# Patient Record
Sex: Male | Born: 1940
Health system: Southern US, Community
[De-identification: ages and names within clinical notes are randomized; demographics above are authoritative.]

## PROBLEM LIST (undated history)

## (undated) DIAGNOSIS — C719 Malignant neoplasm of brain, unspecified: Secondary | ICD-10-CM

## (undated) DIAGNOSIS — R739 Hyperglycemia, unspecified: Secondary | ICD-10-CM

## (undated) DIAGNOSIS — R569 Unspecified convulsions: Secondary | ICD-10-CM

## (undated) HISTORY — DX: Malignant neoplasm of brain, unspecified: C71.9

## (undated) HISTORY — DX: Hyperglycemia, unspecified: R73.9

## (undated) HISTORY — DX: Unspecified convulsions: R56.9

---

## 2003-01-22 ENCOUNTER — Encounter: Payer: Self-pay | Admitting: Internal Medicine

## 2008-05-27 ENCOUNTER — Encounter (INDEPENDENT_AMBULATORY_CARE_PROVIDER_SITE_OTHER): Payer: Self-pay | Admitting: *Deleted

## 2008-07-12 ENCOUNTER — Encounter (INDEPENDENT_AMBULATORY_CARE_PROVIDER_SITE_OTHER): Payer: Self-pay | Admitting: *Deleted

## 2008-07-12 ENCOUNTER — Ambulatory Visit: Payer: Self-pay | Admitting: Internal Medicine

## 2008-07-12 DIAGNOSIS — F528 Other sexual dysfunction not due to a substance or known physiological condition: Secondary | ICD-10-CM

## 2008-07-13 ENCOUNTER — Encounter: Payer: Self-pay | Admitting: Internal Medicine

## 2008-07-14 ENCOUNTER — Ambulatory Visit: Payer: Self-pay | Admitting: Internal Medicine

## 2008-07-19 LAB — CONVERTED CEMR LAB
BUN: 10 mg/dL (ref 6–23)
Basophils Relative: 0 % (ref 0.0–3.0)
Calcium: 9.1 mg/dL (ref 8.4–10.5)
Chloride: 103 meq/L (ref 96–112)
Cholesterol: 168 mg/dL (ref 0–200)
Creatinine, Ser: 0.9 mg/dL (ref 0.4–1.5)
Eosinophils Absolute: 0.1 10*3/uL (ref 0.0–0.7)
Eosinophils Relative: 2.3 % (ref 0.0–5.0)
GFR calc non Af Amer: 89 mL/min
HCT: 41.9 % (ref 39.0–52.0)
Hemoglobin: 14.3 g/dL (ref 13.0–17.0)
MCV: 89.3 fL (ref 78.0–100.0)
Monocytes Absolute: 0.7 10*3/uL (ref 0.1–1.0)
Neutro Abs: 3.3 10*3/uL (ref 1.4–7.7)
Neutrophils Relative %: 53.5 % (ref 43.0–77.0)
RBC: 4.69 M/uL (ref 4.22–5.81)
WBC: 6.1 10*3/uL (ref 4.5–10.5)

## 2008-07-20 ENCOUNTER — Ambulatory Visit: Payer: Self-pay | Admitting: Internal Medicine

## 2008-07-26 ENCOUNTER — Telehealth (INDEPENDENT_AMBULATORY_CARE_PROVIDER_SITE_OTHER): Payer: Self-pay | Admitting: *Deleted

## 2008-07-26 DIAGNOSIS — K921 Melena: Secondary | ICD-10-CM

## 2016-05-03 ENCOUNTER — Encounter (HOSPITAL_COMMUNITY): Payer: Self-pay | Admitting: Emergency Medicine

## 2016-05-03 ENCOUNTER — Emergency Department (HOSPITAL_COMMUNITY)
Admission: EM | Admit: 2016-05-03 | Discharge: 2016-05-03 | Disposition: A | Discharge status: Home / Self Care | Payer: BLUE CROSS/BLUE SHIELD | Attending: Physician Assistant | Admitting: Physician Assistant

## 2016-05-03 ENCOUNTER — Emergency Department (HOSPITAL_COMMUNITY): Payer: BLUE CROSS/BLUE SHIELD

## 2016-05-03 DIAGNOSIS — Z7982 Long term (current) use of aspirin: Secondary | ICD-10-CM | POA: Diagnosis not present

## 2016-05-03 DIAGNOSIS — R079 Chest pain, unspecified: Secondary | ICD-10-CM | POA: Insufficient documentation

## 2016-05-03 LAB — BASIC METABOLIC PANEL
Anion gap: 8 (ref 5–15)
BUN: 11 mg/dL (ref 6–20)
CHLORIDE: 103 mmol/L (ref 101–111)
CO2: 28 mmol/L (ref 22–32)
CREATININE: 0.96 mg/dL (ref 0.61–1.24)
Calcium: 8.9 mg/dL (ref 8.9–10.3)
GFR calc Af Amer: >60 mL/min (ref >60)
GFR calc non Af Amer: >60 mL/min (ref >60)
GLUCOSE: 105 mg/dL — AB (ref 65–99)
POTASSIUM: 3.7 mmol/L (ref 3.5–5.1)
SODIUM: 139 mmol/L (ref 135–145)

## 2016-05-03 LAB — CBC
HEMATOCRIT: 40.3 % (ref 39.0–52.0)
Hemoglobin: 14 g/dL (ref 13.0–17.0)
MCH: 31.2 pg (ref 26.0–34.0)
MCHC: 34.7 g/dL (ref 30.0–36.0)
MCV: 89.8 fL (ref 78.0–100.0)
PLATELETS: 220 10*3/uL (ref 150–400)
RBC: 4.49 MIL/uL (ref 4.22–5.81)
RDW: 12.5 % (ref 11.5–15.5)
WBC: 7 10*3/uL (ref 4.0–10.5)

## 2016-05-03 LAB — I-STAT TROPONIN, ED
TROPONIN I, POC: 0 ng/mL (ref 0.00–0.08)
Troponin i, poc: 0 ng/mL (ref 0.00–0.08)

## 2016-05-03 MED ORDER — GI COCKTAIL ~~LOC~~
30.0000 mL | Freq: Once | ORAL | Status: AC
  Administered 2016-05-03: 30 mL via ORAL
  Filled 2016-05-03: qty 30

## 2016-05-03 NOTE — ED Notes (Signed)
Papers reviewed with pt. And he verbalizes understanding. MD present in room to give results and go over results. Pt. Leaving ambulatory

## 2016-05-03 NOTE — ED Triage Notes (Addendum)
Per pt., he woke up this morning w/ central chest pain describing it as "a little knot."  Denies any diaphoresis, SOB, N/V or any other medical problems at this time.  Pt reports that he took 243 ASA upon waking up.

## 2016-05-03 NOTE — Discharge Instructions (Signed)
I'm unsure what caused that pain that you've had overnight. We've done several rounds of tests and believe that is less likely to be your heart. However we recommend that you follow up with your primary care physician and cardiologist as an outpatient. Please return with any concerns.

## 2016-05-03 NOTE — ED Provider Notes (Signed)
[facility] DEPT Provider Note   CSN: [id number] Arrival date & time: 05/03/16  [id number]     History   Chief Complaint Chief Complaint  Patient presents with  . Chest Pain    HPI Christopher Morris is a 75 y.o. male.  HPI   Patient is a 75 year old male presenting with chest pain. This woke him from sleep at 5 AM. Did not radiate towards  Arms or neck.  No nausea no diaphoresis no shortness of breath. Patient has no risk factors except age. He reports he goes to Dr. every November but has never been diagnosed with hypertension hyperlipidemia or diabetes.  Patient ate a bologna sandwich when he arrived home from work at 1 AM. Patient works as a Consulting civil [place]  History reviewed. No pertinent past medical history.  Patient Active Problem List   Diagnosis Date Noted  . BLOOD IN STOOL 07/26/2008  . ERECTILE DYSFUNCTION 07/12/2008    History reviewed. No pertinent surgical history.     Home Medications    Prior to Admission medications   Medication Sig Start Date End Date Taking? Authorizing Provider  aspirin EC 81 MG tablet Take 243 mg by mouth once.   Yes Historical Provider, MD    Family History No family history on file.  Social History Social History  Substance Use Topics  . Smoking status: Not on file  . Smokeless tobacco: Not on file  . Alcohol use Not on file     Allergies   Review of patient's allergies indicates no known allergies.   Review of Systems Review of Systems  Constitutional: Negative for activity change, fatigue and fever.  HENT: Negative for congestion.   Respiratory: Negative for shortness of breath.   Cardiovascular: Positive for chest pain. Negative for palpitations and leg swelling.  Gastrointestinal: Negative for abdominal pain.  Neurological: Negative for dizziness.  All other systems reviewed and are negative.    Physical Exam Updated Vital Signs BP 137/74   Pulse 68   Temp 98.4 F (36.9 C) (Oral)    Resp 18   Ht 6\' 4"  (1.93 m)   Wt 210 lb (95.3 kg)   SpO2 95%   BMI 25.56 kg/m   Physical Exam  Constitutional: He appears well-developed and well-nourished.  HENT:  Head: Normocephalic and atraumatic.  Eyes: Conjunctivae are normal.  Neck: Neck supple.  Cardiovascular: Normal rate and regular rhythm.   No murmur heard. Pulmonary/Chest: Effort normal and breath sounds normal.  Abdominal: Soft. There is no tenderness.  Musculoskeletal: He exhibits no edema.  Neurological: He is alert.  Skin: Skin is warm and dry.  Psychiatric: He has a normal mood and affect.  Nursing note and vitals reviewed.    ED Treatments / Results  Labs (all labs ordered are listed, but only abnormal results are displayed) Labs Reviewed  BASIC METABOLIC PANEL - Abnormal; Notable for the following:       Result Value   Glucose, Bld 105 (*)    All other components within normal limits  CBC  I-STAT TROPOININ, ED  I-STAT TROPOININ, ED    EKG  EKG Interpretation  Date/Time:  Thursday May 03 2016 06:37:02 EDT Ventricular Rate:  70 PR Interval:    QRS Duration: 91 QT Interval:  378 QTC Calculation: 408 R Axis:   22 Text Interpretation:  Sinus rhythm Abnormal R-wave progression, early transition No previous ECGs available Confirmed by Christy Gentles  MD, DONALD ([id number]) on 05/03/2016 6:45:41 AM Also confirmed by Christy Gentles  MD, DONALD ([id number]), editor [place]  CCT, BEVERLY ([id number])  on 05/03/2016 7:55:16 AM       Radiology Dg Chest 2 View  Result Date: 05/03/2016 CLINICAL DATA:  Chest pain. EXAM: CHEST  2 VIEW COMPARISON:  No prior . FINDINGS: Mediastinum hilar structures normal. Mild left base subsegmental atelectasis. No pleural effusion or pneumothorax. Heart size normal. No acute bony abnormality. IMPRESSION: Mild left base subsegmental atelectasis, otherwise negative exam. Electronically Signed   By: Marcello Moores  Register   On: 05/03/2016 07:28    Procedures Procedures (including critical care  time)  Medications Ordered in ED Medications  gi cocktail (Maalox,Lidocaine,Donnatal) (30 mLs Oral Given 05/03/16 [id number])     Initial Impression / Assessment and Plan / ED Course  I have reviewed the triage vital signs and the nursing notes.  Pertinent labs & imaging results that were available during my care of the patient were reviewed by me and considered in my medical decision making (see chart for details).  Clinical Course    Patient is an incredibly pleasant 75 year old male still working with no past medical history complaining of chest pain. Despite patient's age he has a low heart score and will be able to do a delta troponin. Patient took aspirin prior to arrival.  Do not suspect PE, dissection or any other pathology given the subtelty of this pain. Patient just complains of pain at the base of the sternum and he felt a "knot". On physical exam I believe that this is just the sternal notch.  However given his age we will do a delta troponin. We'll treat for GERD in the case that that's what it is.  Delta trop negative, will give follow up with cards, and PCP.   Patient is comfortable, ambulatory, and taking PO at time of discharge.  Patient expressed understanding about return precautions.    Final Clinical Impressions(s) / ED Diagnoses   Final diagnoses:  Chest pain, unspecified type    New Prescriptions Discharge Medication List as of 05/03/2016 11:51 AM       Javayah Magaw Lyn Tavion Senkbeil, MD 05/03/16 1420

## 2017-06-01 IMAGING — DX DG CHEST 2V
2 series · 2 of 2 positions shown · non-contrast
Comparison: No prior .

CLINICAL DATA: Chest pain.

EXAM:
CHEST  2 VIEW

[w chest pa]
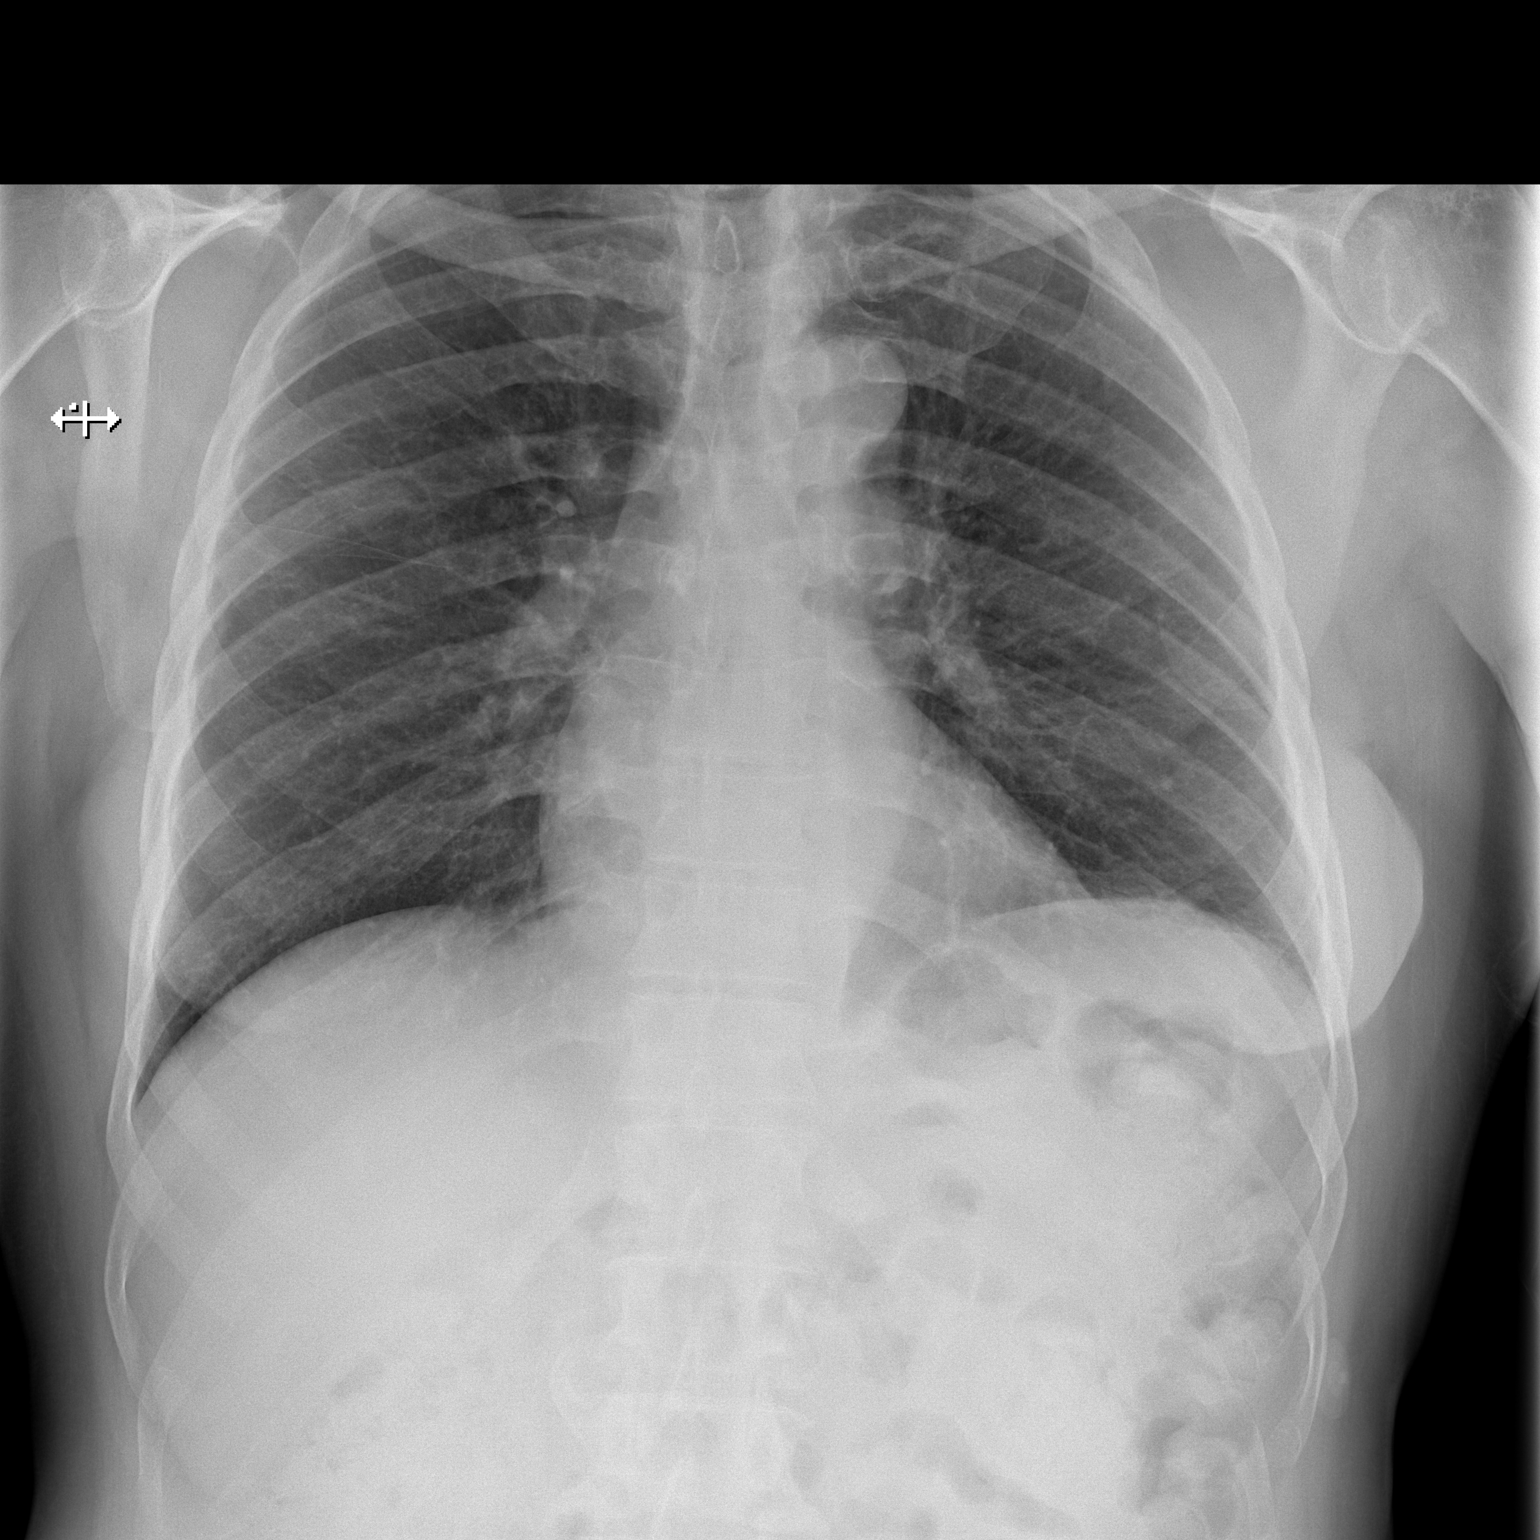

[w chest lat]
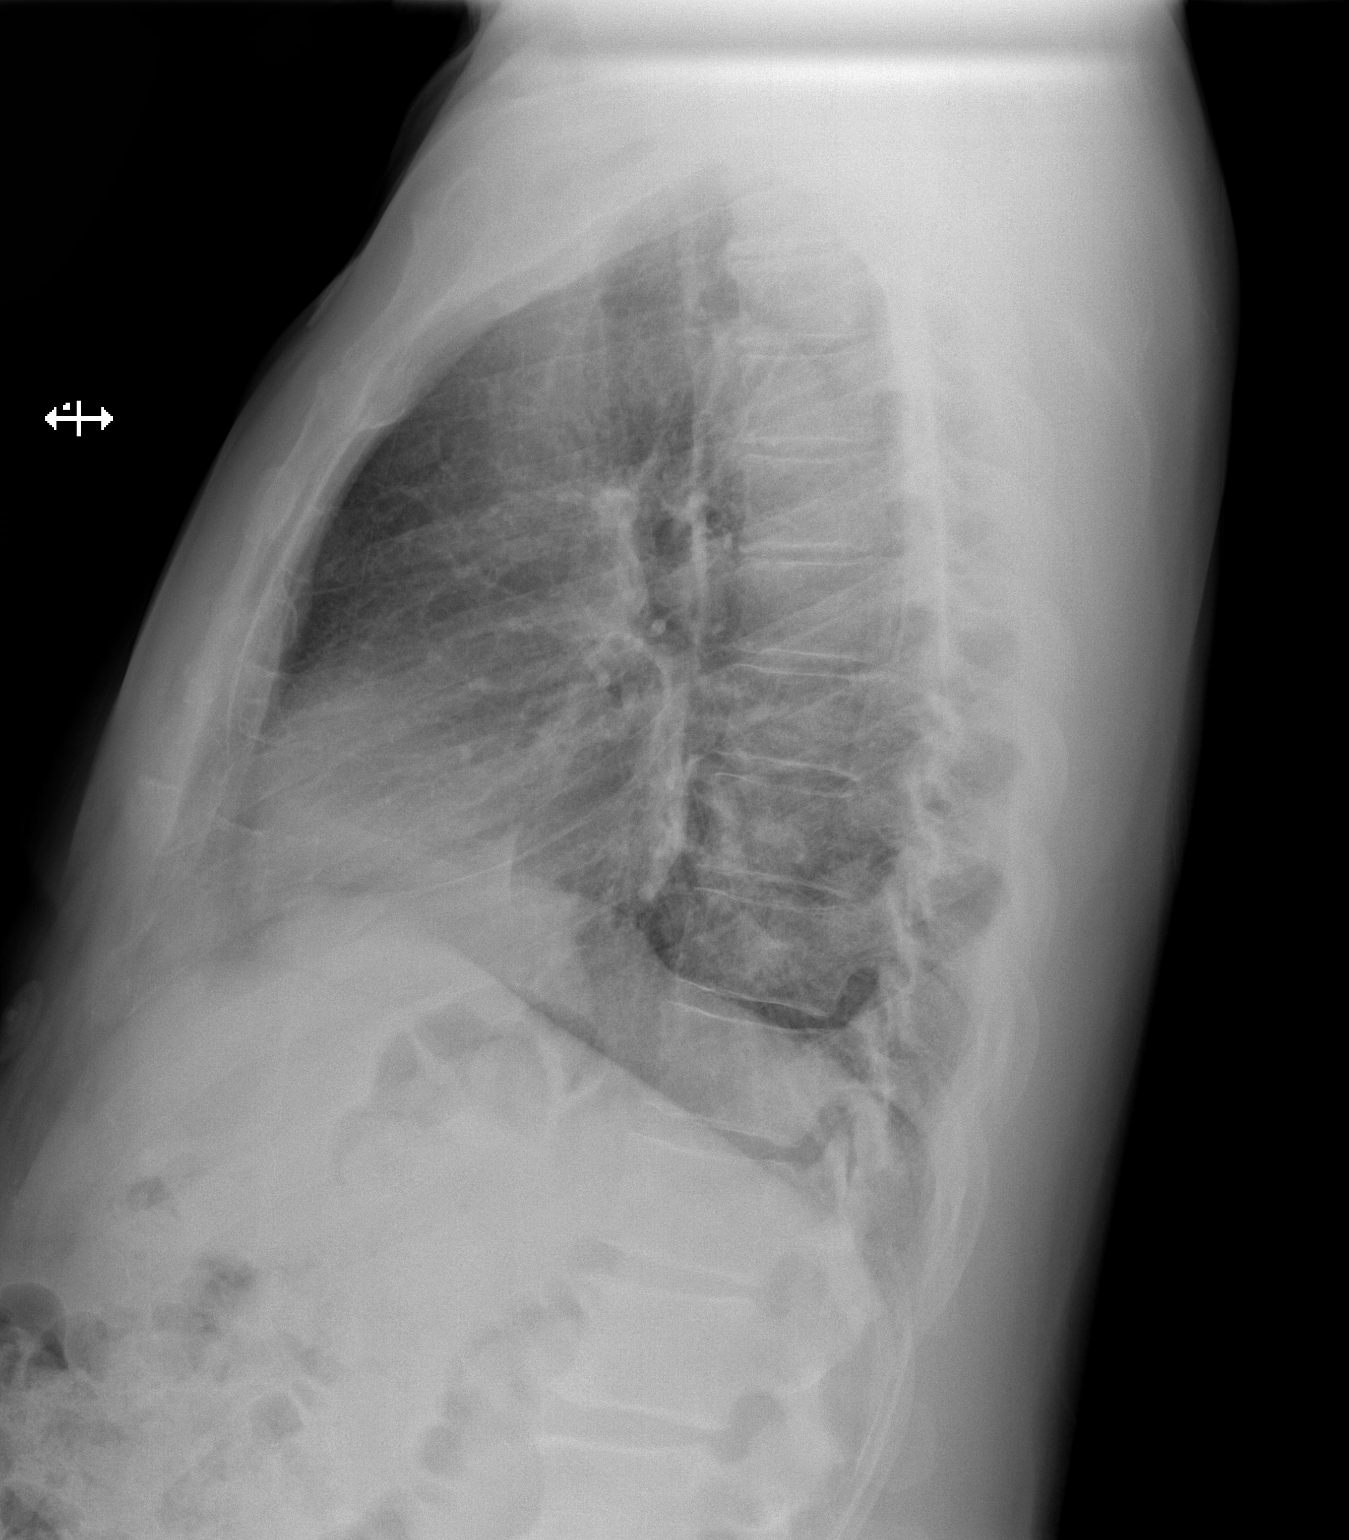

[2 of 2 positions shown; findings below may reference images not displayed]

FINDINGS: Mediastinum hilar structures normal. Mild left base subsegmental
atelectasis. No pleural effusion or pneumothorax. Heart size normal.
No acute bony abnormality.
IMPRESSION: Mild left base subsegmental atelectasis, otherwise negative exam.

## 2017-09-25 ENCOUNTER — Inpatient Hospital Stay (HOSPITAL_COMMUNITY)
Admission: EM | Admit: 2017-09-25 | Discharge: 2017-10-04 | DRG: 025 | Disposition: A | Discharge status: Skilled Nursing Facility | Payer: Medicare Other | Attending: Family Medicine | Admitting: Family Medicine

## 2017-09-25 ENCOUNTER — Emergency Department (HOSPITAL_COMMUNITY): Payer: Medicare Other

## 2017-09-25 ENCOUNTER — Other Ambulatory Visit: Payer: Self-pay

## 2017-09-25 ENCOUNTER — Encounter (HOSPITAL_COMMUNITY): Payer: Self-pay | Admitting: *Deleted

## 2017-09-25 ENCOUNTER — Inpatient Hospital Stay (HOSPITAL_COMMUNITY): Payer: Medicare Other

## 2017-09-25 DIAGNOSIS — G9389 Other specified disorders of brain: Secondary | ICD-10-CM | POA: Diagnosis not present

## 2017-09-25 DIAGNOSIS — E876 Hypokalemia: Secondary | ICD-10-CM | POA: Diagnosis not present

## 2017-09-25 DIAGNOSIS — R739 Hyperglycemia, unspecified: Secondary | ICD-10-CM

## 2017-09-25 DIAGNOSIS — R279 Unspecified lack of coordination: Secondary | ICD-10-CM | POA: Diagnosis not present

## 2017-09-25 DIAGNOSIS — C711 Malignant neoplasm of frontal lobe: Secondary | ICD-10-CM | POA: Diagnosis present

## 2017-09-25 DIAGNOSIS — D72829 Elevated white blood cell count, unspecified: Secondary | ICD-10-CM | POA: Diagnosis not present

## 2017-09-25 DIAGNOSIS — R41841 Cognitive communication deficit: Secondary | ICD-10-CM | POA: Diagnosis not present

## 2017-09-25 DIAGNOSIS — G4089 Other seizures: Secondary | ICD-10-CM | POA: Diagnosis present

## 2017-09-25 DIAGNOSIS — D496 Neoplasm of unspecified behavior of brain: Secondary | ICD-10-CM | POA: Diagnosis not present

## 2017-09-25 DIAGNOSIS — G936 Cerebral edema: Secondary | ICD-10-CM | POA: Diagnosis present

## 2017-09-25 DIAGNOSIS — M6281 Muscle weakness (generalized): Secondary | ICD-10-CM | POA: Diagnosis not present

## 2017-09-25 DIAGNOSIS — R531 Weakness: Secondary | ICD-10-CM | POA: Diagnosis present

## 2017-09-25 DIAGNOSIS — C719 Malignant neoplasm of brain, unspecified: Secondary | ICD-10-CM

## 2017-09-25 DIAGNOSIS — G939 Disorder of brain, unspecified: Secondary | ICD-10-CM | POA: Diagnosis not present

## 2017-09-25 DIAGNOSIS — R2689 Other abnormalities of gait and mobility: Secondary | ICD-10-CM | POA: Diagnosis not present

## 2017-09-25 DIAGNOSIS — R569 Unspecified convulsions: Secondary | ICD-10-CM

## 2017-09-25 DIAGNOSIS — Z48810 Encounter for surgical aftercare following surgery on the sense organs: Secondary | ICD-10-CM | POA: Diagnosis not present

## 2017-09-25 HISTORY — DX: Hyperglycemia, unspecified: R73.9

## 2017-09-25 HISTORY — DX: Unspecified convulsions: R56.9

## 2017-09-25 HISTORY — DX: Malignant neoplasm of brain, unspecified: C71.9

## 2017-09-25 LAB — CBC WITH DIFFERENTIAL/PLATELET
Basophils Absolute: 0 10*3/uL (ref 0.0–0.1)
Basophils Relative: 0 %
Eosinophils Absolute: 0.2 10*3/uL (ref 0.0–0.7)
Eosinophils Relative: 1 %
HEMATOCRIT: 37.8 % — AB (ref 39.0–52.0)
HEMOGLOBIN: 13.1 g/dL (ref 13.0–17.0)
LYMPHS ABS: 3.9 10*3/uL (ref 0.7–4.0)
LYMPHS PCT: 34 %
MCH: 34.1 pg — ABNORMAL HIGH (ref 26.0–34.0)
MCHC: 34.7 g/dL (ref 30.0–36.0)
MCV: 98.4 fL (ref 78.0–100.0)
MONO ABS: 0.6 10*3/uL (ref 0.1–1.0)
MONOS PCT: 5 %
NEUTROS ABS: 7.1 10*3/uL (ref 1.7–7.7)
Neutrophils Relative %: 60 %
Platelets: 214 10*3/uL (ref 150–400)
RBC: 3.84 MIL/uL — ABNORMAL LOW (ref 4.22–5.81)
RDW: 12.5 % (ref 11.5–15.5)
WBC: 11.8 10*3/uL — ABNORMAL HIGH (ref 4.0–10.5)

## 2017-09-25 LAB — RAPID URINE DRUG SCREEN, HOSP PERFORMED
Amphetamines: NOT DETECTED
BARBITURATES: NOT DETECTED
Benzodiazepines: NOT DETECTED
COCAINE: NOT DETECTED
Opiates: NOT DETECTED
TETRAHYDROCANNABINOL: NOT DETECTED

## 2017-09-25 LAB — COMPREHENSIVE METABOLIC PANEL
ALK PHOS: 67 U/L (ref 38–126)
ALT: 14 U/L — ABNORMAL LOW (ref 17–63)
ANION GAP: 13 (ref 5–15)
AST: 24 U/L (ref 15–41)
Albumin: 3.9 g/dL (ref 3.5–5.0)
BILIRUBIN TOTAL: 1.2 mg/dL (ref 0.3–1.2)
BUN: 11 mg/dL (ref 6–20)
CALCIUM: 8.9 mg/dL (ref 8.9–10.3)
CO2: 22 mmol/L (ref 22–32)
Chloride: 104 mmol/L (ref 101–111)
Creatinine, Ser: 0.95 mg/dL (ref 0.61–1.24)
GFR calc Af Amer: >60 mL/min (ref >60)
Glucose, Bld: 192 mg/dL — ABNORMAL HIGH (ref 65–99)
POTASSIUM: 3.4 mmol/L — AB (ref 3.5–5.1)
Sodium: 139 mmol/L (ref 135–145)
TOTAL PROTEIN: 6.5 g/dL (ref 6.5–8.1)

## 2017-09-25 LAB — URINALYSIS, ROUTINE W REFLEX MICROSCOPIC
Bilirubin Urine: NEGATIVE
GLUCOSE, UA: NEGATIVE mg/dL
Hgb urine dipstick: NEGATIVE
KETONES UR: 5 mg/dL — AB
Leukocytes, UA: NEGATIVE
NITRITE: NEGATIVE
PROTEIN: NEGATIVE mg/dL
Specific Gravity, Urine: 1.024 (ref 1.005–1.030)
pH: 8 (ref 5.0–8.0)

## 2017-09-25 LAB — ETHANOL: Alcohol, Ethyl (B): <10 mg/dL (ref <10)

## 2017-09-25 LAB — MAGNESIUM: MAGNESIUM: 2 mg/dL (ref 1.7–2.4)

## 2017-09-25 LAB — HEMOGLOBIN A1C
Hgb A1c MFr Bld: 5.6 % (ref 4.8–5.6)
Mean Plasma Glucose: 114.02 mg/dL

## 2017-09-25 MED ORDER — LEVETIRACETAM IN NACL 1000 MG/100ML IV SOLN
1000.0000 mg | Freq: Once | INTRAVENOUS | Status: AC
  Administered 2017-09-25: 1000 mg via INTRAVENOUS
  Filled 2017-09-25: qty 100

## 2017-09-25 MED ORDER — ONDANSETRON HCL 4 MG/2ML IJ SOLN: 4.0000 mg | Freq: Four times a day (QID) | INTRAMUSCULAR | Status: DC | PRN

## 2017-09-25 MED ORDER — LEVETIRACETAM IN NACL 500 MG/100ML IV SOLN
500.0000 mg | Freq: Two times a day (BID) | INTRAVENOUS | Status: DC
  Administered 2017-09-25 – 2017-09-26 (×2): 500 mg via INTRAVENOUS
  Filled 2017-09-25 (×4): qty 100

## 2017-09-25 MED ORDER — DEXAMETHASONE SODIUM PHOSPHATE 10 MG/ML IJ SOLN
4.0000 mg | Freq: Four times a day (QID) | INTRAMUSCULAR | Status: DC
  Administered 2017-09-25 – 2017-09-30 (×21): 4 mg via INTRAVENOUS
  Administered 2017-09-30: 10 mg via INTRAVENOUS
  Administered 2017-10-01 – 2017-10-02 (×6): 4 mg via INTRAVENOUS
  Filled 2017-09-25 (×26): qty 1

## 2017-09-25 MED ORDER — SODIUM CHLORIDE 0.9 % IV SOLN
INTRAVENOUS | Status: DC
  Administered 2017-09-25: 1000 mL via INTRAVENOUS

## 2017-09-25 MED ORDER — ACETAMINOPHEN 650 MG RE SUPP: 650.0000 mg | Freq: Four times a day (QID) | RECTAL | Status: DC | PRN

## 2017-09-25 MED ORDER — GADOBENATE DIMEGLUMINE 529 MG/ML IV SOLN
20.0000 mL | Freq: Once | INTRAVENOUS | Status: AC
  Administered 2017-09-25: 20 mL via INTRAVENOUS

## 2017-09-25 MED ORDER — IOPAMIDOL (ISOVUE-300) INJECTION 61%
INTRAVENOUS | Status: AC
  Administered 2017-09-25: 100 mL
  Filled 2017-09-25: qty 100

## 2017-09-25 MED ORDER — POTASSIUM CHLORIDE CRYS ER 20 MEQ PO TBCR
40.0000 meq | EXTENDED_RELEASE_TABLET | Freq: Once | ORAL | Status: AC
  Administered 2017-09-25: 40 meq via ORAL
  Filled 2017-09-25: qty 2

## 2017-09-25 MED ORDER — SODIUM CHLORIDE 0.9 % IV SOLN
INTRAVENOUS | Status: DC
  Administered 2017-09-25 – 2017-09-30 (×3): via INTRAVENOUS

## 2017-09-25 MED ORDER — ACETAMINOPHEN 325 MG PO TABS: 650.0000 mg | ORAL_TABLET | Freq: Four times a day (QID) | ORAL | Status: DC | PRN

## 2017-09-25 MED ORDER — SENNOSIDES-DOCUSATE SODIUM 8.6-50 MG PO TABS: 1.0000 | ORAL_TABLET | Freq: Every evening | ORAL | Status: DC | PRN

## 2017-09-25 MED ORDER — ONDANSETRON HCL 4 MG PO TABS: 4.0000 mg | ORAL_TABLET | Freq: Four times a day (QID) | ORAL | Status: DC | PRN

## 2017-09-25 MED ORDER — DEXAMETHASONE SODIUM PHOSPHATE 10 MG/ML IJ SOLN
10.0000 mg | Freq: Once | INTRAMUSCULAR | Status: AC
  Administered 2017-09-25: 10 mg via INTRAVENOUS
  Filled 2017-09-25: qty 1

## 2017-09-25 NOTE — ED Notes (Signed)
Placed condom cath on pt for incontinence while in the ED. Notified Gabriel(RN)

## 2017-09-25 NOTE — H&P (Signed)
History and Physical    Christopher Morris [id number] DOB: Jun 17, 1941 DOA: 09/25/2017  PCP: [facility] Consultants:  neurosurgery Patient coming from: Home. Lives with daughter and grandson  Chief Complaint: seizure activity  HPI: Christopher Morris is a 77 y.o. male with no reported medical hx and follows at [facility] . Family reports unsteady gait and difficulty talking for past several weeks. They initially thought symptoms were due to pt bumping his head at work, but grandson witnessed seizure activity this am prompting them to call EMS. Pt incontinent of urine during seizure. Daughter reports pt works full time driving bus at [employer] and is very independent. No hx of seizures prior to today. Daughter denies any recent fever or illness, no complaints of chest pain, abdominal pain, n/v/d. Hx is limited as pt moderately confused on exam.   ED Course: Calm with no further sz activity on arrival to ED. CT head concerning for left frontal mass that was verified by MRI a/w surrounding edema. Pt has been given IV Decadron and Keppra prior to admission evaluation. Seizure precautions in place. Neurosurgery has been asked to consult.   Review of Systems: As per HPI; otherwise review of systems reviewed and negative.   Ambulatory Status: Ambulates without assistance  History reviewed. No pertinent past medical history.  No past surgical history on file.  Social History   Socioeconomic History  . Marital status: Divorced    Spouse name: Not on file  . Number of children: Not on file  . Years of education: Not on file  . Highest education level: Not on file  Social Needs  . Financial resource strain: Not on file  . Food insecurity - worry: Not on file  . Food insecurity - inability: Not on file  . Transportation needs - medical: Not on file  . Transportation needs - non-medical: Not on file  Occupational History  . Not on file  Tobacco Use  . Smoking status: Not on file  Substance  and Sexual Activity  . Alcohol use: Not on file  . Drug use: Not on file  . Sexual activity: Not on file  Other Topics Concern  . Not on file  Social History Narrative  . Not on file    No Known Allergies  No family history on file.  Prior to Admission medications   Medication Sig Start Date End Date Taking? Authorizing Provider  aspirin EC 81 MG tablet Take 243 mg by mouth once.    [provider]    Physical Exam: Vitals:   09/25/17 1315 09/25/17 1330 09/25/17 1345 09/25/17 1400  BP: (!) 148/81 140/77 (!) 141/79 (!) 149/84  Pulse: 84 81 86 88  Resp: 18 18 19 18   Temp:      TempSrc:      SpO2: 98% 97% 97% 97%  Weight:      Height:         General:  Appears calm and comfortable and is NAD Eyes: PERRL, EOMI, normal lids, iris ENT: grossly normal hearing, lips & tongue, mmm; appropriate dentition Neck: no LAD, masses or thyromegaly; no carotid bruits Cardiovascular: S1S2 RRR, no m/r/g. No LE edema.  Respiratory:  CTA bilaterally with no wheezes/rales/rhonchi.  Normal respiratory effort. Abdomen: soft, NT, ND, NABS Skin: no rash or induration seen on limited exam Musculoskeletal: grossly normal tone BUE/BLE, good ROM, no bony abnormality Lower extremity: No LE edema.  Limited foot exam with no ulcerations.  2+ distal pulses. Psychiatric: normal mood with flat affect.  Answering with one word which daughter states is unusual for him. Alert and oriented to name and people around him Neurologic: 4/5 strength in RUE o/w limited exam unremarkable,  sensation intact    Radiological Exams on Admission: Dg Chest 2 View  Result Date: 09/25/2017 CLINICAL DATA:  Seizure EXAM: CHEST - 2 VIEW COMPARISON:  May 03, 2016 FINDINGS: There is no edema or consolidation. The heart size and pulmonary vascularity are normal. No adenopathy. No pneumothorax. No bone lesions. IMPRESSION: No edema or consolidation. Electronically Signed   By: Lowella Grip III M.D.   On:  09/25/2017 09:09   Ct Head Wo Contrast  Result Date: 09/25/2017 CLINICAL DATA:  Seizure and fall out of bed. EXAM: CT HEAD WITHOUT CONTRAST CT CERVICAL SPINE WITHOUT CONTRAST TECHNIQUE: Multidetector CT imaging of the head and cervical spine was performed following the standard protocol without intravenous contrast. Multiplanar CT image reconstructions of the cervical spine were also generated. COMPARISON:  None. FINDINGS: CT HEAD FINDINGS Brain: There is a possible 3.0 cm mass in the left frontal lobe with prominent surrounding vasogenic edema. Gray-white matter differentiation is preserved. Small amount of hyperdensity along the superior aspect of the mass may reflect hemorrhage. There is slight rightward shift of the anterior falx and effacement of the left lateral ventricle frontal horn. No hydrocephalus or extra-axial collection. Vascular: Atherosclerotic vascular calcification of the carotid siphons. No hyperdense vessel. Skull: Normal. Negative for fracture or focal lesion. Sinuses/Orbits: No acute finding. Other: None. CT CERVICAL SPINE FINDINGS Alignment: Reversal of the normal cervical lordosis. No traumatic malalignment. Skull base and vertebrae: No acute fracture. No primary bone lesion or focal pathologic process. Soft tissues and spinal canal: No prevertebral fluid or swelling. No visible canal hematoma. Disc levels: Moderate to severe degenerative disc disease and facet uncovertebral hypertrophy throughout the cervical spine. Multilevel moderate to severe neuroforaminal stenoses. Upper chest: Negative. Other: None. IMPRESSION: 1. Probable 3.0 cm mass in the left frontal lobe with prominent surrounding vasogenic edema. Small amount of hyperdensity along the superior aspect of the mass likely reflects a hemorrhagic component. Findings less likely represent hemorrhagic infarct given relative preservation of the gray-white differentiation. Recommend contrast-enhanced MRI of the brain for further  evaluation. 2. No acute cervical spine fracture. Moderate to severe degenerative changes throughout the cervical spine. Critical Value/emergent results were called by telephone at the time of interpretation on 09/25/2017 at 9:12 am to Dr. Isla Pence , who verbally acknowledged these results. Electronically Signed   By: Titus Dubin M.D.   On: 09/25/2017 09:17   Ct Cervical Spine Wo Contrast  Result Date: 09/25/2017 CLINICAL DATA:  Seizure and fall out of bed. EXAM: CT HEAD WITHOUT CONTRAST CT CERVICAL SPINE WITHOUT CONTRAST TECHNIQUE: Multidetector CT imaging of the head and cervical spine was performed following the standard protocol without intravenous contrast. Multiplanar CT image reconstructions of the cervical spine were also generated. COMPARISON:  None. FINDINGS: CT HEAD FINDINGS Brain: There is a possible 3.0 cm mass in the left frontal lobe with prominent surrounding vasogenic edema. Gray-white matter differentiation is preserved. Small amount of hyperdensity along the superior aspect of the mass may reflect hemorrhage. There is slight rightward shift of the anterior falx and effacement of the left lateral ventricle frontal horn. No hydrocephalus or extra-axial collection. Vascular: Atherosclerotic vascular calcification of the carotid siphons. No hyperdense vessel. Skull: Normal. Negative for fracture or focal lesion. Sinuses/Orbits: No acute finding. Other: None. CT CERVICAL SPINE FINDINGS Alignment: Reversal of the normal cervical lordosis. No  traumatic malalignment. Skull base and vertebrae: No acute fracture. No primary bone lesion or focal pathologic process. Soft tissues and spinal canal: No prevertebral fluid or swelling. No visible canal hematoma. Disc levels: Moderate to severe degenerative disc disease and facet uncovertebral hypertrophy throughout the cervical spine. Multilevel moderate to severe neuroforaminal stenoses. Upper chest: Negative. Other: None. IMPRESSION: 1. Probable 3.0  cm mass in the left frontal lobe with prominent surrounding vasogenic edema. Small amount of hyperdensity along the superior aspect of the mass likely reflects a hemorrhagic component. Findings less likely represent hemorrhagic infarct given relative preservation of the gray-white differentiation. Recommend contrast-enhanced MRI of the brain for further evaluation. 2. No acute cervical spine fracture. Moderate to severe degenerative changes throughout the cervical spine. Critical Value/emergent results were called by telephone at the time of interpretation on 09/25/2017 at 9:12 am to Dr. Isla Pence , who verbally acknowledged these results. Electronically Signed   By: Titus Dubin M.D.   On: 09/25/2017 09:17   [name] ZW Contrast  Result Date: 09/25/2017 CLINICAL DATA:  77 year old male found down by bed. Possible seizure, incontinent. Combative. EXAM: MRI HEAD WITHOUT AND WITH CONTRAST TECHNIQUE: Multiplanar, multiecho pulse sequences of the brain and surrounding structures were obtained without and with intravenous contrast. CONTRAST:  8mL MULTIHANCE GADOBENATE DIMEGLUMINE 529 MG/ML IV SOLN COMPARISON:  Head CT without contrast 0849 hours today. FINDINGS: Brain: Partially hemorrhagic mass involving the medial left superior frontal gyrus, and left cingulate gyrus. The mass encompasses 36 x 28 x 39 millimeters (AP by transverse by CC), is heterogeneously T2 hyperintense, and solidly enhancing. The medial margin of the mass does abut the interhemispheric fissure, but the lesion appears intra-axial on all sequences. There is a small volume of late subacute appearing hemorrhage within the central aspect of the mass (intrinsic T1 hyperintensity on series 14, image 37) with surrounding hemosiderin. There is associated blood product diffusion artifact. There is restricted diffusion along the peripheral margins of the mass suggesting hyper cellularity. There is confluent surrounding T2 and FLAIR  hyperintensity in a pattern suggesting vasogenic edema. There is mild regional mass effect, mostly on the anterior body of the left lateral ventricle. There is trace local rightward midline shift. There is no larger area of restricted diffusion about the mass. No diffusion restriction elsewhere. No ventriculomegaly. No extra-axial or intraventricular blood. Basilar cisterns are normal. No other abnormal intracranial enhancement is identified. No definite dural thickening. No other cerebral edema or mass effect. Outside of the anterior left frontal lobe there is mild for age symmetric bilateral nonspecific periventricular white matter T2 and FLAIR hyperintensity. No cortical encephalomalacia or definite chronic cerebral blood products. There is mild for age T2 heterogeneity in the bilateral deep gray matter nuclei. The brainstem and cerebellum appear normal. Cervicomedullary junction and pituitary are within normal limits. Vascular: Major intracranial vascular flow voids are preserved. Skull and upper cervical spine: Advanced degenerative changes in the visible cervical spine. Visualized bone marrow signal is within normal limits. Negative visible cervical spinal cord aside from mild degenerative stenosis. Sinuses/Orbits: Negative. Other: Visible internal auditory structures appear normal. Scalp and face soft tissues appear negative. IMPRESSION: 1. Solitary 3.9 cm intra-axial, enhancing, and partially hemorrhagic mass of the medial left superior frontal gyrus affecting the adjacent left cingulate gyrus. Surrounding vasogenic edema versus nonenhancing tumor. Mild regional mass effect. Top differential considerations are solitary metastasis and high-grade glioma. CNS lymphoma is less likely. 2. No superimposed infarct or other abnormal brain enhancement. 3. Mild for age signal  changes in the cerebral white matter and deep gray matter compatible with chronic small vessel disease. Electronically Signed   By: Genevie Ann M.D.    On: 09/25/2017 12:39    EKG: Independently reviewed.  NSR with rate 93; nonspecific ST changes with no evidence of acute ischemia   Labs on Admission: I have personally reviewed the available labs and imaging studies at the time of the admission.  Pertinent labs:  WBC 11.8 (no shift) K+ 3.4 Glucose 192 etoh <10   Assessment/Plan Principal Problem:   Brain tumor ([place]) Active Problems:   Seizure (HCC)   Hyperglycemia   Leukocytosis    New onset seizure in setting of brain mass (new dx) -per MRI, 3.9cm partially hemorrhagic mass left frontal lobe with surrounding vasogenic edema vs nonenhancing tumor -Appreciate neurosurgery assisstance -IV Decadron 4mg  q6h -IV Keppra BID -seizure precautions -CT chest, abdomen and pelvis for malignancy workup  Leukocytosis, mild -suspect reactive with recent seizure. Pt afebrile and nontoxic appearing. No evidence of infection on xray. U/a unremarkable -suspect will go up with IV steroids -Monitor  Hypokalemia, mild -replete po  -Monitor  Hyperglycemia -no hx of diabetes. Again could be reactive and now on IV steroids. -check A1C    DVT prophylaxis: SCDs Code Status: Full - confirmed with patient/family Family Communication: present at bedside at time of admission  Disposition Plan: Home once clinically improved Consults called: neurosurgery  Admission status: inpatient  Patrici Ranks, NP-C [place]  pgr [phone]   If note is complete, please contact covering daytime or nighttime physician. [email or web address] Password [email or web address] [facility]  09/25/2017, 2:41 PM

## 2017-09-25 NOTE — ED Notes (Signed)
Pt moved to a bigger room, several family member are visiting from out of town and they don't fit in room.

## 2017-09-25 NOTE — ED Notes (Signed)
Patient was incontinent of urine. Assisted patient changing bed sheets without incident.

## 2017-09-25 NOTE — ED Provider Notes (Signed)
[facility] Provider Note   CSN: [id number] Arrival date & time: 09/25/17  9678     History   Chief Complaint Chief Complaint  Patient presents with  . Seizures    HPI Jarquis Walker is a 77 y.o. male.  Pt presents to the ED today with seizure.  Pt's grandson sleeps in the same room and heard him yelling out and making weird noises.  He said he was jerking both his upper arms.  He did bite his tongue and was incontinent of urine.  Pt has never had a seizure in the past.  He did hit his head at work and developed a hematoma, but did not get this checked out.  Pt initially post ictal and combative, but he is coming around now.  Pt denies any pain.      History reviewed. No pertinent past medical history.  Patient Active Problem List   Diagnosis Date Noted  . BLOOD IN STOOL 07/26/2008  . ERECTILE DYSFUNCTION 07/12/2008    No past surgical history on file.     Home Medications    Prior to Admission medications   Medication Sig Start Date End Date Taking? Authorizing Provider  aspirin EC 81 MG tablet Take 243 mg by mouth once.    [provider]    Family History No family history on file.  Social History Social History   Tobacco Use  . Smoking status: Not on file  Substance Use Topics  . Alcohol use: Not on file  . Drug use: Not on file     Allergies   Patient has no known allergies.   Review of Systems Review of Systems  Neurological: Positive for seizures.  All other systems reviewed and are negative.    Physical Exam Updated Vital Signs BP 138/79   Pulse 79   Temp 97.9 F (36.6 C) (Temporal)   Resp 16   Ht 6\' 4"  (1.93 m)   Wt 99.8 kg (220 lb)   SpO2 95%   BMI 26.78 kg/m   Physical Exam  Constitutional: He is oriented to person, place, and time. He appears well-developed and well-nourished.  HENT:  Head: Normocephalic and atraumatic.  Right Ear: External ear normal.  Left Ear:  External ear normal.  Nose: Nose normal.  Mouth/Throat: Oropharynx is clear and moist.  Eyes: Conjunctivae and EOM are normal. Pupils are equal, round, and reactive to light.  Neck: Normal range of motion. Neck supple.  Cardiovascular: Normal rate, regular rhythm, normal heart sounds and intact distal pulses.  Pulmonary/Chest: Effort normal and breath sounds normal.  Abdominal: Soft. Bowel sounds are normal.  Musculoskeletal: Normal range of motion.  Neurological: He is alert and oriented to person, place, and time.  Skin: Skin is warm. Capillary refill takes less than 2 seconds.  Psychiatric: He has a normal mood and affect. His behavior is normal. Judgment and thought content normal.  Nursing note and vitals reviewed.    ED Treatments / Results  Labs (all labs ordered are listed, but only abnormal results are displayed) Labs Reviewed  CBC WITH DIFFERENTIAL/PLATELET - Abnormal; Notable for the following components:      Result Value   WBC 11.8 (*)    RBC 3.84 (*)    HCT 37.8 (*)    MCH 34.1 (*)    All other components within normal limits  COMPREHENSIVE METABOLIC PANEL - Abnormal; Notable for the following components:   Potassium 3.4 (*)    Glucose, Bld  192 (*)    ALT 14 (*)    All other components within normal limits  MAGNESIUM  ETHANOL  RAPID URINE DRUG SCREEN, HOSP PERFORMED  URINALYSIS, ROUTINE W REFLEX MICROSCOPIC  CBG MONITORING, ED    EKG  EKG Interpretation  Date/Time:  Wednesday September 25 2017 08:23:43 EDT Ventricular Rate:  93 PR Interval:    QRS Duration: 93 QT Interval:  358 QTC Calculation: 446 R Axis:   7 Text Interpretation:  Age not entered, assumed to be  77 years old for purpose of ECG interpretation Sinus rhythm Atrial premature complex Abnormal R-wave progression, early transition Left ventricular hypertrophy Borderline T abnormalities, inferior leads Confirmed by Isla Pence [phone]) on 09/25/2017 8:39:36 AM Also confirmed by Isla Pence  [phone]), editor Philomena Doheny [phone])  on 09/25/2017 8:51:43 AM       Radiology Dg Chest 2 View  Result Date: 09/25/2017 CLINICAL DATA:  Seizure EXAM: CHEST - 2 VIEW COMPARISON:  May 03, 2016 FINDINGS: There is no edema or consolidation. The heart size and pulmonary vascularity are normal. No adenopathy. No pneumothorax. No bone lesions. IMPRESSION: No edema or consolidation. Electronically Signed   By: Lowella Grip III M.D.   On: 09/25/2017 09:09   Ct Head Wo Contrast  Result Date: 09/25/2017 CLINICAL DATA:  Seizure and fall out of bed. EXAM: CT HEAD WITHOUT CONTRAST CT CERVICAL SPINE WITHOUT CONTRAST TECHNIQUE: Multidetector CT imaging of the head and cervical spine was performed following the standard protocol without intravenous contrast. Multiplanar CT image reconstructions of the cervical spine were also generated. COMPARISON:  None. FINDINGS: CT HEAD FINDINGS Brain: There is a possible 3.0 cm mass in the left frontal lobe with prominent surrounding vasogenic edema. Gray-white matter differentiation is preserved. Small amount of hyperdensity along the superior aspect of the mass may reflect hemorrhage. There is slight rightward shift of the anterior falx and effacement of the left lateral ventricle frontal horn. No hydrocephalus or extra-axial collection. Vascular: Atherosclerotic vascular calcification of the carotid siphons. No hyperdense vessel. Skull: Normal. Negative for fracture or focal lesion. Sinuses/Orbits: No acute finding. Other: None. CT CERVICAL SPINE FINDINGS Alignment: Reversal of the normal cervical lordosis. No traumatic malalignment. Skull base and vertebrae: No acute fracture. No primary bone lesion or focal pathologic process. Soft tissues and spinal canal: No prevertebral fluid or swelling. No visible canal hematoma. Disc levels: Moderate to severe degenerative disc disease and facet uncovertebral hypertrophy throughout the cervical spine. Multilevel moderate to severe  neuroforaminal stenoses. Upper chest: Negative. Other: None. IMPRESSION: 1. Probable 3.0 cm mass in the left frontal lobe with prominent surrounding vasogenic edema. Small amount of hyperdensity along the superior aspect of the mass likely reflects a hemorrhagic component. Findings less likely represent hemorrhagic infarct given relative preservation of the gray-white differentiation. Recommend contrast-enhanced MRI of the brain for further evaluation. 2. No acute cervical spine fracture. Moderate to severe degenerative changes throughout the cervical spine. Critical Value/emergent results were called by telephone at the time of interpretation on 09/25/2017 at 9:12 am to Dr. Isla Pence , who verbally acknowledged these results. Electronically Signed   By: Titus Dubin M.D.   On: 09/25/2017 09:17   Ct Cervical Spine Wo Contrast  Result Date: 09/25/2017 CLINICAL DATA:  Seizure and fall out of bed. EXAM: CT HEAD WITHOUT CONTRAST CT CERVICAL SPINE WITHOUT CONTRAST TECHNIQUE: Multidetector CT imaging of the head and cervical spine was performed following the standard protocol without intravenous contrast. Multiplanar CT image reconstructions of the cervical spine were  also generated. COMPARISON:  None. FINDINGS: CT HEAD FINDINGS Brain: There is a possible 3.0 cm mass in the left frontal lobe with prominent surrounding vasogenic edema. Gray-white matter differentiation is preserved. Small amount of hyperdensity along the superior aspect of the mass may reflect hemorrhage. There is slight rightward shift of the anterior falx and effacement of the left lateral ventricle frontal horn. No hydrocephalus or extra-axial collection. Vascular: Atherosclerotic vascular calcification of the carotid siphons. No hyperdense vessel. Skull: Normal. Negative for fracture or focal lesion. Sinuses/Orbits: No acute finding. Other: None. CT CERVICAL SPINE FINDINGS Alignment: Reversal of the normal cervical lordosis. No traumatic  malalignment. Skull base and vertebrae: No acute fracture. No primary bone lesion or focal pathologic process. Soft tissues and spinal canal: No prevertebral fluid or swelling. No visible canal hematoma. Disc levels: Moderate to severe degenerative disc disease and facet uncovertebral hypertrophy throughout the cervical spine. Multilevel moderate to severe neuroforaminal stenoses. Upper chest: Negative. Other: None. IMPRESSION: 1. Probable 3.0 cm mass in the left frontal lobe with prominent surrounding vasogenic edema. Small amount of hyperdensity along the superior aspect of the mass likely reflects a hemorrhagic component. Findings less likely represent hemorrhagic infarct given relative preservation of the gray-white differentiation. Recommend contrast-enhanced MRI of the brain for further evaluation. 2. No acute cervical spine fracture. Moderate to severe degenerative changes throughout the cervical spine. Critical Value/emergent results were called by telephone at the time of interpretation on 09/25/2017 at 9:12 am to Dr. Isla Pence , who verbally acknowledged these results. Electronically Signed   By: Titus Dubin M.D.   On: 09/25/2017 09:17   [name] RJ Contrast  Result Date: 09/25/2017 CLINICAL DATA:  77 year old male found down by bed. Possible seizure, incontinent. Combative. EXAM: MRI HEAD WITHOUT AND WITH CONTRAST TECHNIQUE: Multiplanar, multiecho pulse sequences of the brain and surrounding structures were obtained without and with intravenous contrast. CONTRAST:  69mL MULTIHANCE GADOBENATE DIMEGLUMINE 529 MG/ML IV SOLN COMPARISON:  Head CT without contrast 0849 hours today. FINDINGS: Brain: Partially hemorrhagic mass involving the medial left superior frontal gyrus, and left cingulate gyrus. The mass encompasses 36 x 28 x 39 millimeters (AP by transverse by CC), is heterogeneously T2 hyperintense, and solidly enhancing. The medial margin of the mass does abut the interhemispheric fissure,  but the lesion appears intra-axial on all sequences. There is a small volume of late subacute appearing hemorrhage within the central aspect of the mass (intrinsic T1 hyperintensity on series 14, image 37) with surrounding hemosiderin. There is associated blood product diffusion artifact. There is restricted diffusion along the peripheral margins of the mass suggesting hyper cellularity. There is confluent surrounding T2 and FLAIR hyperintensity in a pattern suggesting vasogenic edema. There is mild regional mass effect, mostly on the anterior body of the left lateral ventricle. There is trace local rightward midline shift. There is no larger area of restricted diffusion about the mass. No diffusion restriction elsewhere. No ventriculomegaly. No extra-axial or intraventricular blood. Basilar cisterns are normal. No other abnormal intracranial enhancement is identified. No definite dural thickening. No other cerebral edema or mass effect. Outside of the anterior left frontal lobe there is mild for age symmetric bilateral nonspecific periventricular white matter T2 and FLAIR hyperintensity. No cortical encephalomalacia or definite chronic cerebral blood products. There is mild for age T2 heterogeneity in the bilateral deep gray matter nuclei. The brainstem and cerebellum appear normal. Cervicomedullary junction and pituitary are within normal limits. Vascular: Major intracranial vascular flow voids are preserved. Skull and upper  cervical spine: Advanced degenerative changes in the visible cervical spine. Visualized bone marrow signal is within normal limits. Negative visible cervical spinal cord aside from mild degenerative stenosis. Sinuses/Orbits: Negative. Other: Visible internal auditory structures appear normal. Scalp and face soft tissues appear negative. IMPRESSION: 1. Solitary 3.9 cm intra-axial, enhancing, and partially hemorrhagic mass of the medial left superior frontal gyrus affecting the adjacent left  cingulate gyrus. Surrounding vasogenic edema versus nonenhancing tumor. Mild regional mass effect. Top differential considerations are solitary metastasis and high-grade glioma. CNS lymphoma is less likely. 2. No superimposed infarct or other abnormal brain enhancement. 3. Mild for age signal changes in the cerebral white matter and deep gray matter compatible with chronic small vessel disease. Electronically Signed   By: Genevie Ann M.D.   On: 09/25/2017 12:39    Procedures Procedures (including critical care time)  Medications Ordered in ED Medications  0.9 %  sodium chloride infusion (1,000 mLs Intravenous New Bag/Given 09/25/17 1008)  levETIRAcetam (KEPPRA) IVPB 1000 mg/100 mL premix (0 mg Intravenous Stopped 09/25/17 1030)  dexamethasone (DECADRON) injection 10 mg (10 mg Intravenous Given 09/25/17 1009)  gadobenate dimeglumine (MULTIHANCE) injection 20 mL (20 mLs Intravenous Contrast Given 09/25/17 1230)     Initial Impression / Assessment and Plan / ED Course  I have reviewed the triage vital signs and the nursing notes.  Pertinent labs & imaging results that were available during my care of the patient were reviewed by me and considered in my medical decision making (see chart for details).   Pt d/w Dr. Sherwood Gambler (NS) who will see pt in consult.  He recommends decadron 4 mg IV q 6 hours after the first dose of 10 mg decadron given in ED.  He also said to put pt on keppra 500 mg twice a day.  He plans on likely surgery when swelling goes down.  He recommends metastatic work up.  I ordered the ct scans for that.  Pt d/w triad for admission.  Metastatic work up is pending.  Final Clinical Impressions(s) / ED Diagnoses   Final diagnoses:  Seizure ([place])  Brain tumor ([place])  Vasogenic brain edema [facility])    ED Discharge Orders    None       Isla Pence, MD 09/25/17 1326

## 2017-09-25 NOTE — ED Triage Notes (Signed)
Patient was found by his bed on the floor, per ems family states patient had upper body shaking for 4-5 mins. Incont. Of urine and oral trauma. Upon ems arrival patient was combative , however upon arrival to ed patient is calm alert oriented to name and place. Moves all ext.

## 2017-09-25 NOTE — Consult Note (Signed)
Reason for Consult:  Newly diagnosed left frontal brain tumor, new onset generalized seizure Referring Physician:  Dr. Isla Morris  [name] is a 77 y.o. left-handed black male.  HPI: Patient had been in his usual state of health when this morning about 0700 the patient had a generalized seizure witnessed by his grandson Social research officer, government.  He and his mother called 78, and were told to initiate CPR (although the patient's grandson noted that he was breathing). Patient was transferred to the [facility] emergency room where he was evaluated by Dr. Gilford Morris. CT of the brain without contrast suggested a left frontal brain mass, and MRI of the brain without and with gadolinium revealed a enhancing mass lesion consistent with a tumor with surrounding vasogenic cerebral edema. Primary brain tumor versus metastasis could not be distinguished.  Dr. Gilford Morris ordered Decadron 10 mg IV and Keppra 1000 mg IV. Dr. Gilford Morris arrange for admission to the [facility]ad hospitalist service, and requested neurosurgical consultation.  I recommended the patient be continued on Decadron 4 mg IV every 6 hours and Keppra 500 mg twice a day, and that a metastatic workup be performed including a CT scan of the chest/abdomen/pelvis.  Patient's family (daughter Christopher Morris), grandson Christopher Morris), ex-wife, friend,) noted that for the past week or so, he's had some mild right-sided weakness, and some difficulty with speech often having difficulty finding a word and expressing himself. They explain that he was typically very talkative, but would now respond with one word answers.  They recall that he had a mild trauma 2 weeks ago, when he hit the right posterolateral aspect of his head.  Currently the patient denies any headache, nausea, or vomiting. He's had no previous history of seizures. He has had several episodes of urinary incontinence in the emergency room.  Past Medical History:  He denies a history of hypertension, myocardial infarction,  cancer, stroke, peptic ulcer disease, diabetes, and lung disease  Past Surgical History:  His only previous surgery was a right knee arthroscopy many years ago  Family History:  Parents have passed on  Social History:  He does not smoke, drink alcohol, or have a history of substance abuse  Allergies: No Known Allergies  Medications:   He took no medications prior to admission.  ROS:  Notable for those difficulties described in his history of present illness and past medical history, but is otherwise unremarkable.  Physical Examination: Patient is a well-developed well-nourished black male in no acute distress. Blood pressure 137/67, pulse 83, temperature 98.2 F (36.8 C), resp. rate 15, height _0  (1.93 m), weight 99.8 kg (220 lb), SpO2 95 %. Lungs:  Clear to auscultation, symmetrical respiratory excursion. Heart:  Regular rate and rhythm, no murmur. Abdomen:  Soft, nondistended, bowel sounds present. Extremity:  No clubbing, cyanosis, or edema.  Neurological Examination: Mental Status Examination:  Awake and alert, oriented to name, [facility] hospital, and 2019. Following commands. Speech limited, with difficulty finding the word or expression that he wants to use at times. Cranial Nerve Examination:  Pupils equal, round, 3 mm bilateral, reactive to light. Mild ptosis of the left lid, which the patient's family says is old, otherwise EOMI. Facial sensation intact. Mild flattening of the right nasolabial fold consistent with mild right facial weakness. Hearing present by. Palatal movements symmetrical. Tongue midline. Motor Examination:  Moderate weakness of the right upper extremity, with pronounced drift, with more subtle weakness of the right lower extremity. Sensory Examination:  Intact to pinprick throughout. Reflex Examination:  Symmetrical. Gait and Stance Examination:  Not tested due to the nature of the patient's condition.   Results for orders placed or performed during  the hospital encounter of 09/25/17 (from the past 48 hour(s))  CBC WITH DIFFERENTIAL     Status: Abnormal   Collection Time: 09/25/17  8:34 AM  Result Value Ref Range   WBC 11.8 (H) 4.0 - 10.5 K/uL   RBC 3.84 (L) 4.22 - 5.81 MIL/uL   Hemoglobin 13.1 13.0 - 17.0 g/dL   HCT 37.8 (L) 39.0 - 52.0 %   MCV 98.4 78.0 - 100.0 fL   MCH 34.1 (H) 26.0 - 34.0 pg   MCHC 34.7 30.0 - 36.0 g/dL   RDW 12.5 11.5 - 15.5 %   Platelets 214 150 - 400 K/uL   Neutrophils Relative % 60 %   Neutro Abs 7.1 1.7 - 7.7 K/uL   Lymphocytes Relative 34 %   Lymphs Abs 3.9 0.7 - 4.0 K/uL   Monocytes Relative 5 %   Monocytes Absolute 0.6 0.1 - 1.0 K/uL   Eosinophils Relative 1 %   Eosinophils Absolute 0.2 0.0 - 0.7 K/uL   Basophils Relative 0 %   Basophils Absolute 0.0 0.0 - 0.1 K/uL    Comment: Performed at [facility], [address], [place], [place] [address]  Comprehensive metabolic panel     Status: Abnormal   Collection Time: 09/25/17  8:34 AM  Result Value Ref Range   Sodium 139 135 - 145 mmol/L   Potassium 3.4 (L) 3.5 - 5.1 mmol/L   Chloride 104 101 - 111 mmol/L   CO2 22 22 - 32 mmol/L   Glucose, Bld 192 (H) 65 - 99 mg/dL   BUN 11 6 - 20 mg/dL   Creatinine, Ser 0.95 0.61 - 1.24 mg/dL   Calcium 8.9 8.9 - 10.3 mg/dL   Total Protein 6.5 6.5 - 8.1 g/dL   Albumin 3.9 3.5 - 5.0 g/dL   AST 24 15 - 41 U/L   ALT 14 (L) 17 - 63 U/L   Alkaline Phosphatase 67 38 - 126 U/L   Total Bilirubin 1.2 0.3 - 1.2 mg/dL   GFR calc non Af Amer >60 >60 mL/min   GFR calc Af Amer >60 >60 mL/min    Comment: (NOTE) The eGFR has been calculated using the CKD EPI equation. This calculation has not been validated in all clinical situations. eGFR's persistently <60 mL/min signify possible Chronic Kidney Disease.    Anion gap 13 5 - 15    Comment: Performed at [facility] [address], [place], [place] [address]  Magnesium     Status: None   Collection Time: 09/25/17  8:34 AM  Result Value Ref Range    Magnesium 2.0 1.7 - 2.4 mg/dL    Comment: Performed at [facility] [address] [place], [place] [address]  Ethanol/ETOH     Status: None   Collection Time: 09/25/17  8:52 AM  Result Value Ref Range   Alcohol, Ethyl (B) <10 <10 mg/dL    Comment:        LOWEST DETECTABLE LIMIT FOR SERUM ALCOHOL IS 10 mg/dL FOR MEDICAL PURPOSES ONLY Performed at [facility], [place] [address], [place],  [address]   Urine rapid drug screen (hosp performed)     Status: None   Collection Time: 09/25/17  2:10 PM  Result Value Ref Range   Opiates NONE DETECTED NONE DETECTED   Cocaine  NONE DETECTED NONE DETECTED   Benzodiazepines NONE DETECTED NONE DETECTED   Amphetamines NONE DETECTED NONE DETECTED   Tetrahydrocannabinol NONE DETECTED NONE DETECTED   Barbiturates NONE DETECTED NONE DETECTED    Comment: (NOTE) DRUG SCREEN FOR MEDICAL PURPOSES ONLY.  IF CONFIRMATION IS NEEDED FOR ANY PURPOSE, NOTIFY LAB WITHIN 5 DAYS. LOWEST DETECTABLE LIMITS FOR URINE DRUG SCREEN Drug Class                     Cutoff (ng/mL) Amphetamine and metabolites    1000 Barbiturate and metabolites    200 Benzodiazepine                 262 Tricyclics and metabolites     300 Opiates and metabolites        300 Cocaine and metabolites        300 THC                            50 Performed at [facility], [place] [address], [place], [place] [address]   Urinalysis, Routine w reflex microscopic     Status: Abnormal   Collection Time: 09/25/17  2:10 PM  Result Value Ref Range   Color, Urine YELLOW YELLOW   APPearance CLEAR CLEAR   Specific Gravity, Urine 1.024 1.005 - 1.030   pH 8.0 5.0 - 8.0   Glucose, UA NEGATIVE NEGATIVE mg/dL   Hgb urine dipstick NEGATIVE NEGATIVE   Bilirubin Urine NEGATIVE NEGATIVE   Ketones, ur 5 (A) NEGATIVE mg/dL   Protein, ur NEGATIVE NEGATIVE mg/dL   Nitrite NEGATIVE NEGATIVE   Leukocytes, UA NEGATIVE NEGATIVE    Comment: Performed at [facility] [address], [place], [place] [address]  Hemoglobin A1c     Status: None   Collection Time: 09/25/17  4:41 PM  Result Value Ref Range   Hgb A1c MFr Bld 5.6 4.8 - 5.6 %    Comment: (NOTE) Pre diabetes:          5.7%-6.4% Diabetes:              >6.4% Glycemic control for   <7.0% adults with diabetes    Mean Plasma Glucose 114.02 mg/dL    Comment: Performed at [facility] [address] [place], [place] [address]    Dg Chest 2 View  Result Date: 09/25/2017 CLINICAL DATA:  Seizure EXAM: CHEST - 2 VIEW COMPARISON:  May 03, 2016 FINDINGS: There is no edema or consolidation. The heart size and pulmonary vascularity are normal. No adenopathy. No pneumothorax. No bone lesions. IMPRESSION: No edema or consolidation. Electronically Signed   By: Lowella Grip III M.D.   On: 09/25/2017 09:09   Ct Head Wo Contrast  Result Date: 09/25/2017 CLINICAL DATA:  Seizure and fall out of bed. EXAM: CT HEAD WITHOUT CONTRAST CT CERVICAL SPINE WITHOUT CONTRAST TECHNIQUE: Multidetector CT imaging of the head and cervical spine was performed following the standard protocol without intravenous contrast. Multiplanar CT image reconstructions of the cervical spine were also generated. COMPARISON:  None. FINDINGS: CT HEAD FINDINGS Brain: There is a possible 3.0 cm mass in the left frontal lobe with prominent surrounding vasogenic edema. Gray-white matter differentiation is preserved. Small amount of hyperdensity along the superior aspect of the mass may reflect hemorrhage. There is slight rightward shift of the anterior falx and effacement of the left lateral ventricle frontal horn. No hydrocephalus or extra-axial collection.  Vascular: Atherosclerotic vascular calcification of the carotid siphons. No hyperdense vessel. Skull: Normal. Negative for fracture or focal lesion. Sinuses/Orbits: No acute finding. Other: None. CT CERVICAL SPINE FINDINGS Alignment: Reversal of the normal cervical lordosis. No traumatic malalignment.  Skull base and vertebrae: No acute fracture. No primary bone lesion or focal pathologic process. Soft tissues and spinal canal: No prevertebral fluid or swelling. No visible canal hematoma. Disc levels: Moderate to severe degenerative disc disease and facet uncovertebral hypertrophy throughout the cervical spine. Multilevel moderate to severe neuroforaminal stenoses. Upper chest: Negative. Other: None. IMPRESSION: 1. Probable 3.0 cm mass in the left frontal lobe with prominent surrounding vasogenic edema. Small amount of hyperdensity along the superior aspect of the mass likely reflects a hemorrhagic component. Findings less likely represent hemorrhagic infarct given relative preservation of the gray-white differentiation. Recommend contrast-enhanced MRI of the brain for further evaluation. 2. No acute cervical spine fracture. Moderate to severe degenerative changes throughout the cervical spine. Critical Value/emergent results were called by telephone at the time of interpretation on 09/25/2017 at 9:12 am to Dr. Isla Morris , who verbally acknowledged these results. Electronically Signed   By: Titus Dubin M.D.   On: 09/25/2017 09:17   Ct Chest W Contrast  Result Date: 09/25/2017 CLINICAL DATA:  Brain mass.  Evaluate for primary neoplasm. EXAM: CT CHEST, ABDOMEN, AND PELVIS WITH CONTRAST TECHNIQUE: Multidetector CT imaging of the chest, abdomen and pelvis was performed following the standard protocol during bolus administration of intravenous contrast. CONTRAST:  1110m ISOVUE-300 IOPAMIDOL (ISOVUE-300) INJECTION 61% COMPARISON:  None FINDINGS: CT CHEST FINDINGS Cardiovascular: The heart size appears within normal limits. Aortic atherosclerosis. No pericardial effusion. Mediastinum/Nodes: Normal appearance of the thyroid gland. The trachea appears patent and is midline. Normal appearance of the esophagus. No enlarged mediastinal or hilar lymph nodes. Lungs/Pleura: No pleural effusion. Mild subsegmental  atelectasis or scar noted in the lung bases. No suspicious pulmonary nodule or mass identified. Musculoskeletal: No aggressive lytic or sclerotic bone lesions. CT ABDOMEN PELVIS FINDINGS Hepatobiliary: No focal liver abnormality is seen. No gallstones, gallbladder wall thickening, or biliary dilatation. Pancreas: Unremarkable. No pancreatic ductal dilatation or surrounding inflammatory changes. Spleen: Normal in size without focal abnormality. Adrenals/Urinary Tract: Normal appearance of the adrenal glands. The kidneys are both unremarkable. Urinary bladder appears normal. Stomach/Bowel: Stomach normal. The small bowel loops have a normal course and caliber. The appendix is visualized and appears normal. Unremarkable appearance of the colon. Vascular/Lymphatic: Aortic atherosclerosis. No aneurysm. No abdominal adenopathy. No pelvic or inguinal adenopathy. Reproductive: Prostate gland is unremarkable. Other: No free fluid or fluid collections. No peritoneal nodularity. 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 No aggressive lytic or sclerotic bone lesion. IMPRESSION: 1. No findings identified to indicate site of primary neoplastic process to explain brain mass. 2.  Aortic Atherosclerosis (ICD10-I70.0). Electronically Signed   By: TKerby MoorsM.D.   On: 09/25/2017 14:53   Ct Cervical Spine Wo Contrast  Result Date: 09/25/2017 CLINICAL DATA:  Seizure and fall out of bed. EXAM: CT HEAD WITHOUT CONTRAST CT CERVICAL SPINE WITHOUT CONTRAST TECHNIQUE: Multidetector CT imaging of the head and cervical spine was performed following the standard protocol without intravenous contrast. Multiplanar CT image reconstructions of the cervical spine were also generated. COMPARISON:  None. FINDINGS: CT HEAD FINDINGS Brain: There is a possible 3.0 cm mass in the left frontal lobe with prominent surrounding vasogenic edema. Gray-white matter differentiation is preserved. Small amount of  hyperdensity along the superior aspect of the mass may  reflect hemorrhage. There is slight rightward shift of the anterior falx and effacement of the left lateral ventricle frontal horn. No hydrocephalus or extra-axial collection. Vascular: Atherosclerotic vascular calcification of the carotid siphons. No hyperdense vessel. Skull: Normal. Negative for fracture or focal lesion. Sinuses/Orbits: No acute finding. Other: None. CT CERVICAL SPINE FINDINGS Alignment: Reversal of the normal cervical lordosis. No traumatic malalignment. Skull base and vertebrae: No acute fracture. No primary bone lesion or focal pathologic process. Soft tissues and spinal canal: No prevertebral fluid or swelling. No visible canal hematoma. Disc levels: Moderate to severe degenerative disc disease and facet uncovertebral hypertrophy throughout the cervical spine. Multilevel moderate to severe neuroforaminal stenoses. Upper chest: Negative. Other: None. IMPRESSION: 1. Probable 3.0 cm mass in the left frontal lobe with prominent surrounding vasogenic edema. Small amount of hyperdensity along the superior aspect of the mass likely reflects a hemorrhagic component. Findings less likely represent hemorrhagic infarct given relative preservation of the gray-white differentiation. Recommend contrast-enhanced MRI of the brain for further evaluation. 2. No acute cervical spine fracture. Moderate to severe degenerative changes throughout the cervical spine. Critical Value/emergent results were called by telephone at the time of interpretation on 09/25/2017 at 9:12 am to Dr. Isla Morris , who verbally acknowledged these results. Electronically Signed   By: Titus Dubin M.D.   On: 09/25/2017 09:17   [name] VO Contrast  Result Date: 09/25/2017 CLINICAL DATA:  77 year old male found down by bed. Possible seizure, incontinent. Combative. EXAM: MRI HEAD WITHOUT AND WITH CONTRAST TECHNIQUE: Multiplanar, multiecho pulse sequences of the brain and  surrounding structures were obtained without and with intravenous contrast. CONTRAST:  20m MULTIHANCE GADOBENATE DIMEGLUMINE 529 MG/ML IV SOLN COMPARISON:  Head CT without contrast 0849 hours today. FINDINGS: Brain: Partially hemorrhagic mass involving the medial left superior frontal gyrus, and left cingulate gyrus. The mass encompasses 36 x 28 x 39 millimeters (AP by transverse by CC), is heterogeneously T2 hyperintense, and solidly enhancing. The medial margin of the mass does abut the interhemispheric fissure, but the lesion appears intra-axial on all sequences. There is a small volume of late subacute appearing hemorrhage within the central aspect of the mass (intrinsic T1 hyperintensity on series 14, image 37) with surrounding hemosiderin. There is associated blood product diffusion artifact. There is restricted diffusion along the peripheral margins of the mass suggesting hyper cellularity. There is confluent surrounding T2 and FLAIR hyperintensity in a pattern suggesting vasogenic edema. There is mild regional mass effect, mostly on the anterior body of the left lateral ventricle. There is trace local rightward midline shift. There is no larger area of restricted diffusion about the mass. No diffusion restriction elsewhere. No ventriculomegaly. No extra-axial or intraventricular blood. Basilar cisterns are normal. No other abnormal intracranial enhancement is identified. No definite dural thickening. No other cerebral edema or mass effect. Outside of the anterior left frontal lobe there is mild for age symmetric bilateral nonspecific periventricular white matter T2 and FLAIR hyperintensity. No cortical encephalomalacia or definite chronic cerebral blood products. There is mild for age T2 heterogeneity in the bilateral deep gray matter nuclei. The brainstem and cerebellum appear normal. Cervicomedullary junction and pituitary are within normal limits. Vascular: Major intracranial vascular flow voids are  preserved. Skull and upper cervical spine: Advanced degenerative changes in the visible cervical spine. Visualized bone marrow signal is within normal limits. Negative visible cervical spinal cord aside from mild degenerative stenosis. Sinuses/Orbits: Negative. Other: Visible internal auditory structures appear normal. Scalp and face soft tissues appear negative.  IMPRESSION: 1. Solitary 3.9 cm intra-axial, enhancing, and partially hemorrhagic mass of the medial left superior frontal gyrus affecting the adjacent left cingulate gyrus. Surrounding vasogenic edema versus nonenhancing tumor. Mild regional mass effect. Top differential considerations are solitary metastasis and high-grade glioma. CNS lymphoma is less likely. 2. No superimposed infarct or other abnormal brain enhancement. 3. Mild for age signal changes in the cerebral white matter and deep gray matter compatible with chronic small vessel disease. Electronically Signed   By: Genevie Ann M.D.   On: 09/25/2017 12:39   Ct Abdomen Pelvis W Contrast  Result Date: 09/25/2017 CLINICAL DATA:  Brain mass.  Evaluate for primary neoplasm. EXAM: CT CHEST, ABDOMEN, AND PELVIS WITH CONTRAST TECHNIQUE: Multidetector CT imaging of the chest, abdomen and pelvis was performed following the standard protocol during bolus administration of intravenous contrast. CONTRAST:  132m ISOVUE-300 IOPAMIDOL (ISOVUE-300) INJECTION 61% COMPARISON:  None FINDINGS: CT CHEST FINDINGS Cardiovascular: The heart size appears within normal limits. Aortic atherosclerosis. No pericardial effusion. Mediastinum/Nodes: Normal appearance of the thyroid gland. The trachea appears patent and is midline. Normal appearance of the esophagus. No enlarged mediastinal or hilar lymph nodes. Lungs/Pleura: No pleural effusion. Mild subsegmental atelectasis or scar noted in the lung bases. No suspicious pulmonary nodule or mass identified. Musculoskeletal: No aggressive lytic or sclerotic bone lesions. CT  ABDOMEN PELVIS FINDINGS Hepatobiliary: No focal liver abnormality is seen. No gallstones, gallbladder wall thickening, or biliary dilatation. Pancreas: Unremarkable. No pancreatic ductal dilatation or surrounding inflammatory changes. Spleen: Normal in size without focal abnormality. Adrenals/Urinary Tract: Normal appearance of the adrenal glands. The kidneys are both unremarkable. Urinary bladder appears normal. Stomach/Bowel: Stomach normal. The small bowel loops have a normal course and caliber. The appendix is visualized and appears normal. Unremarkable appearance of the colon. Vascular/Lymphatic: Aortic atherosclerosis. No aneurysm. No abdominal adenopathy. No pelvic or inguinal adenopathy. Reproductive: Prostate gland is unremarkable. Other: No free fluid or fluid collections. No peritoneal nodularity. 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 No aggressive lytic or sclerotic bone lesion. IMPRESSION: 1. No findings identified to indicate site of primary neoplastic process to explain brain mass. 2.  Aortic Atherosclerosis (ICD10-I70.0). Electronically Signed   By: TKerby MoorsM.D.   On: 09/25/2017 14:53     Assessment/Plan: Patient with a new onset generalized seizure earlier today, has been found to have a moderately large (4 x 3.5 x 3 cm) left medial frontal enhancing mass lesion with surrounding vasogenic cerebral edema. I spoke with the patient and his family who was at his bedside. I've reviewed the MRI images with the patient's family. Patient is being admitted for further workup. Subsequent to my seeing the patient, he underwent CT scan of the chest abdomen and pelvis that did not reveal a "site of a primary neoplastic process ". Patient is being continued on dexamethasone and Keppra. I anticipate recommending stereotactic craniotomy for surgical resection of this lesion, possibly with proceeding preoperative stereotactic radiosurgery. The patient's and his  family's questions were answered for them.  NHosie Spangle MD 09/25/2017, 5:45 PM

## 2017-09-25 NOTE — ED Notes (Signed)
Patient returned from CT family at bedside. Yolanda Bonine states he heard pt make a noise and he place patient on the floor. Family states patient hasn't been acting right since he hit his head 2 weeks ago

## 2017-09-25 NOTE — ED Notes (Signed)
Attempted to call report

## 2017-09-25 NOTE — ED Notes (Signed)
Provider at bedside

## 2017-09-26 ENCOUNTER — Encounter (HOSPITAL_COMMUNITY): Payer: Self-pay | Admitting: *Deleted

## 2017-09-26 ENCOUNTER — Other Ambulatory Visit: Payer: Self-pay

## 2017-09-26 ENCOUNTER — Other Ambulatory Visit: Payer: Self-pay | Admitting: Neurosurgery

## 2017-09-26 DIAGNOSIS — R739 Hyperglycemia, unspecified: Secondary | ICD-10-CM

## 2017-09-26 DIAGNOSIS — E876 Hypokalemia: Secondary | ICD-10-CM

## 2017-09-26 DIAGNOSIS — R569 Unspecified convulsions: Secondary | ICD-10-CM

## 2017-09-26 DIAGNOSIS — G936 Cerebral edema: Secondary | ICD-10-CM

## 2017-09-26 LAB — BASIC METABOLIC PANEL
ANION GAP: 9 (ref 5–15)
BUN: 11 mg/dL (ref 6–20)
CO2: 22 mmol/L (ref 22–32)
Calcium: 8.9 mg/dL (ref 8.9–10.3)
Chloride: 108 mmol/L (ref 101–111)
Creatinine, Ser: 0.81 mg/dL (ref 0.61–1.24)
Glucose, Bld: 142 mg/dL — ABNORMAL HIGH (ref 65–99)
Potassium: 4 mmol/L (ref 3.5–5.1)
SODIUM: 139 mmol/L (ref 135–145)

## 2017-09-26 LAB — CBC
HEMATOCRIT: 37.2 % — AB (ref 39.0–52.0)
Hemoglobin: 12.4 g/dL — ABNORMAL LOW (ref 13.0–17.0)
MCH: 32.9 pg (ref 26.0–34.0)
MCHC: 33.3 g/dL (ref 30.0–36.0)
MCV: 98.7 fL (ref 78.0–100.0)
Platelets: 190 10*3/uL (ref 150–400)
RBC: 3.77 MIL/uL — ABNORMAL LOW (ref 4.22–5.81)
RDW: 12.6 % (ref 11.5–15.5)
WBC: 9.4 10*3/uL (ref 4.0–10.5)

## 2017-09-26 MED ORDER — LEVETIRACETAM 500 MG PO TABS
500.0000 mg | ORAL_TABLET | Freq: Two times a day (BID) | ORAL | Status: DC
  Administered 2017-09-26 – 2017-09-30 (×8): 500 mg via ORAL
  Filled 2017-09-26 (×8): qty 1

## 2017-09-26 NOTE — Progress Notes (Signed)
Responded to [facility] to assist with completing AD.  AD completed and copies given to patient and nurse for chart.  Chaplain available as needed.    09/26/17 1200  Clinical Encounter Type  Visited With Patient and family together;Health care provider  Visit Type Initial;Social support  Referral From Nurse  Spiritual Encounters  Spiritual Needs Literature;Emotional  Stress Factors  Patient Stress Factors None identified  Family Stress Factors None identified  Advance Directives (For Healthcare)  Does Patient Have a Medical Advance Directive? Yes  Does patient want to make changes to medical advance directive? Yes (Inpatient - patient requests chaplain consult to change a medical advance directive)  Type of Advance Directive [place];Living will  Copy of [place] in Chart? Yes  Copy of Living Will in Chart? Yes  Cristopher [place], [facility], Pager [phone]

## 2017-09-26 NOTE — Progress Notes (Addendum)
PROGRESS NOTE  Christopher Morris OVF:[id number] DOB: 1941/01/28 DOA: 09/25/2017 PCP: No primary care provider on file.  HPI/Recap of past 24 hours: Christopher Morris is a 77 y.o. male with no reported medical hx and follows at [facility] . Family reports unsteady gait and difficulty talking for past several weeks.   MRI revealed left frontal mass with surrounding edema.  Neurosurgery consulted.  Started on  IV Decadron and Keppra.    09/26/2017: Patient seen and examined with his daughter at bedside.  He has no new complaints.  Denies any headache change in vision weakness or numbness.  He also denies chest pain or palpitations or dyspnea.   Assessment/Plan: Principal Problem:   Brain tumor ([place]) Active Problems:   Seizure ([place])   Hyperglycemia   Leukocytosis   Hypokalemia  New onset seizure in the setting of newly diagnosed brain mass Neurosurgery following Continue IV Decadron and Keppra Neurochecks every 4 hours Fall precaution CT abdomen pelvis with contrast revealed no findings to indicate site of primary neoplastic process to explain brain mass CT chest with contrast revealed no acute findings Neurosurgery anticipates recommending stereotactic craniotomy for surgical resection of the brain lesion possibly with proceeding preoperative stereotactic radiosurgery.   Hypokalemia, resolved Potassium 4 from 3.4 Replete electrolytes as indicated  Code Status: Full  Family Communication: Daughter at bedside  Disposition Plan: Home when hemodynamically stable neurosurgery   Consultants:  None  Procedures:  None  Antimicrobials:  None  DVT prophylaxis: SCDs   Objective: Vitals:   09/26/17 0011 09/26/17 0407 09/26/17 0750 09/26/17 1223  BP: 126/73 122/75 124/70 133/74  Pulse: 74 68 67 77  Resp: 18 18 18 18   Temp: 99.2 F (37.3 C) 98.2 F (36.8 C) 98.5 F (36.9 C) 97.8 F (36.6 C)  TempSrc: Oral Oral Oral Oral  SpO2: 95% 96% 96% 96%  Weight: 93.3 kg (205  lb 11 oz)     Height: 6\' 4"  (1.93 m)       Intake/Output Summary (Last 24 hours) at 09/26/2017 1403 Last data filed at 09/26/2017 1100 Gross per 24 hour  Intake 1376.67 ml  Output 800 ml  Net 576.67 ml   Filed Weights   09/25/17 0840 09/26/17 0011  Weight: 99.8 kg (220 lb) 93.3 kg (205 lb 11 oz)    Exam:   General: 77 year old African-American male well-developed well-nourished no acute distress alert and oriented x3  Cardiovascular: Regular rate and rhythm with no rubs or gallops  Respiratory: Clear to auscultation with no wheezes or rales  Abdomen: Soft nontender nondistended normal bowel sounds x4  Musculoskeletal: Mild weakness on left lower extremity  Skin: No rash  Psychiatry: Mood is appropriate for condition setting  Data Reviewed: CBC: Recent Labs  Lab 09/25/17 0834 09/26/17 0426  WBC 11.8* 9.4  NEUTROABS 7.1  --   HGB 13.1 12.4*  HCT 37.8* 37.2*  MCV 98.4 98.7  PLT 214 841   Basic Metabolic Panel: Recent Labs  Lab 09/25/17 0834 09/26/17 0426  NA 139 139  K 3.4* 4.0  CL 104 108  CO2 22 22  GLUCOSE 192* 142*  BUN 11 11  CREATININE 0.95 0.81  CALCIUM 8.9 8.9  MG 2.0  --    GFR: Estimated Creatinine Clearance: 92.3 mL/min (by C-G formula based on SCr of 0.81 mg/dL). Liver Function Tests: Recent Labs  Lab 09/25/17 0834  AST 24  ALT 14*  ALKPHOS 67  BILITOT 1.2  PROT 6.5  ALBUMIN 3.9   No results for  input(s): LIPASE, AMYLASE in the last 168 hours. No results for input(s): AMMONIA in the last 168 hours. Coagulation Profile: No results for input(s): INR, PROTIME in the last 168 hours. Cardiac Enzymes: No results for input(s): CKTOTAL, CKMB, CKMBINDEX, TROPONINI in the last 168 hours. BNP (last 3 results) No results for input(s): PROBNP in the last 8760 hours. HbA1C: Recent Labs    09/25/17 1641  HGBA1C 5.6   CBG: No results for input(s): GLUCAP in the last 168 hours. Lipid Profile: No results for input(s): CHOL, HDL, LDLCALC,  TRIG, CHOLHDL, LDLDIRECT in the last 72 hours. Thyroid Function Tests: No results for input(s): TSH, T4TOTAL, FREET4, T3FREE, THYROIDAB in the last 72 hours. Anemia Panel: No results for input(s): VITAMINB12, FOLATE, FERRITIN, TIBC, IRON, RETICCTPCT in the last 72 hours. Urine analysis:    Component Value Date/Time   COLORURINE YELLOW 09/25/2017 1410   APPEARANCEUR CLEAR 09/25/2017 1410   LABSPEC 1.024 09/25/2017 1410   PHURINE 8.0 09/25/2017 1410   GLUCOSEU NEGATIVE 09/25/2017 1410   HGBUR NEGATIVE 09/25/2017 1410   BILIRUBINUR NEGATIVE 09/25/2017 1410   KETONESUR 5 (A) 09/25/2017 1410   PROTEINUR NEGATIVE 09/25/2017 1410   NITRITE NEGATIVE 09/25/2017 1410   LEUKOCYTESUR NEGATIVE 09/25/2017 1410   Sepsis Labs: @LABRCNTIP (procalcitonin:4,lacticidven:4)  )No results found for this or any previous visit (from the past 240 hour(s)).    Studies: Ct Chest W Contrast  Result Date: 09/25/2017 CLINICAL DATA:  Brain mass.  Evaluate for primary neoplasm. EXAM: CT CHEST, ABDOMEN, AND PELVIS WITH CONTRAST TECHNIQUE: Multidetector CT imaging of the chest, abdomen and pelvis was performed following the standard protocol during bolus administration of intravenous contrast. CONTRAST:  137mL ISOVUE-300 IOPAMIDOL (ISOVUE-300) INJECTION 61% COMPARISON:  None FINDINGS: CT CHEST FINDINGS Cardiovascular: The heart size appears within normal limits. Aortic atherosclerosis. No pericardial effusion. Mediastinum/Nodes: Normal appearance of the thyroid gland. The trachea appears patent and is midline. Normal appearance of the esophagus. No enlarged mediastinal or hilar lymph nodes. Lungs/Pleura: No pleural effusion. Mild subsegmental atelectasis or scar noted in the lung bases. No suspicious pulmonary nodule or mass identified. Musculoskeletal: No aggressive lytic or sclerotic bone lesions. CT ABDOMEN PELVIS FINDINGS Hepatobiliary: No focal liver abnormality is seen. No gallstones, gallbladder wall thickening, or  biliary dilatation. Pancreas: Unremarkable. No pancreatic ductal dilatation or surrounding inflammatory changes. Spleen: Normal in size without focal abnormality. Adrenals/Urinary Tract: Normal appearance of the adrenal glands. The kidneys are both unremarkable. Urinary bladder appears normal. Stomach/Bowel: Stomach normal. The small bowel loops have a normal course and caliber. The appendix is visualized and appears normal. Unremarkable appearance of the colon. Vascular/Lymphatic: Aortic atherosclerosis. No aneurysm. No abdominal adenopathy. No pelvic or inguinal adenopathy. Reproductive: Prostate gland is unremarkable. Other: No free fluid or fluid collections. No peritoneal nodularity. 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 No aggressive lytic or sclerotic bone lesion. IMPRESSION: 1. No findings identified to indicate site of primary neoplastic process to explain brain mass. 2.  Aortic Atherosclerosis (ICD10-I70.0). Electronically Signed   By: Kerby Moors M.D.   On: 09/25/2017 14:53   Ct Abdomen Pelvis W Contrast  Result Date: 09/25/2017 CLINICAL DATA:  Brain mass.  Evaluate for primary neoplasm. EXAM: CT CHEST, ABDOMEN, AND PELVIS WITH CONTRAST TECHNIQUE: Multidetector CT imaging of the chest, abdomen and pelvis was performed following the standard protocol during bolus administration of intravenous contrast. CONTRAST:  164mL ISOVUE-300 IOPAMIDOL (ISOVUE-300) INJECTION 61% COMPARISON:  None FINDINGS: CT CHEST FINDINGS Cardiovascular: The heart size appears within normal limits. Aortic atherosclerosis.  No pericardial effusion. Mediastinum/Nodes: Normal appearance of the thyroid gland. The trachea appears patent and is midline. Normal appearance of the esophagus. No enlarged mediastinal or hilar lymph nodes. Lungs/Pleura: No pleural effusion. Mild subsegmental atelectasis or scar noted in the lung bases. No suspicious pulmonary nodule or mass identified.  Musculoskeletal: No aggressive lytic or sclerotic bone lesions. CT ABDOMEN PELVIS FINDINGS Hepatobiliary: No focal liver abnormality is seen. No gallstones, gallbladder wall thickening, or biliary dilatation. Pancreas: Unremarkable. No pancreatic ductal dilatation or surrounding inflammatory changes. Spleen: Normal in size without focal abnormality. Adrenals/Urinary Tract: Normal appearance of the adrenal glands. The kidneys are both unremarkable. Urinary bladder appears normal. Stomach/Bowel: Stomach normal. The small bowel loops have a normal course and caliber. The appendix is visualized and appears normal. Unremarkable appearance of the colon. Vascular/Lymphatic: Aortic atherosclerosis. No aneurysm. No abdominal adenopathy. No pelvic or inguinal adenopathy. Reproductive: Prostate gland is unremarkable. Other: No free fluid or fluid collections. No peritoneal nodularity. 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 No aggressive lytic or sclerotic bone lesion. IMPRESSION: 1. No findings identified to indicate site of primary neoplastic process to explain brain mass. 2.  Aortic Atherosclerosis (ICD10-I70.0). Electronically Signed   By: Kerby Moors M.D.   On: 09/25/2017 14:53    Scheduled Meds: . dexamethasone  4 mg Intravenous Q6H    Continuous Infusions: . sodium chloride 75 mL/hr at 09/25/17 2308  . levETIRAcetam Stopped (09/26/17 1016)     LOS: 1 day     Kayleen Memos, MD [facility] Pager [phone]  If 7PM-7AM, please contact night-coverage [email or web address] Password [facility] 09/26/2017, 2:03 PM

## 2017-09-26 NOTE — Plan of Care (Signed)
  Progressing Spiritual Needs Ability to function at adequate level 09/26/2017 0758 - Progressing by Myriam Forehand, RN 09/26/2017 0529 - Progressing by Myriam Forehand, RN Education: Expressions of having a comfortable level of knowledge regarding the disease process will increase 09/26/2017 0758 - Progressing by Myriam Forehand, RN 09/26/2017 0529 - Progressing by Myriam Forehand, RN Education: Expressions of having a comfortable level of knowledge regarding the disease process will increase 09/26/2017 0758 - Progressing by Myriam Forehand, RN

## 2017-09-26 NOTE — Plan of Care (Signed)
  Progressing Spiritual Needs Ability to function at adequate level 09/26/2017 0529 - Progressing by Myriam Forehand, RN Education: Expressions of having a comfortable level of knowledge regarding the disease process will increase 09/26/2017 0529 - Progressing by Myriam Forehand, RN

## 2017-09-26 NOTE — Care Management Note (Signed)
Case Management Note  Patient Details  Name: Christopher Morris MRN: [id number] Date of Birth: 1941-02-13  Subjective/Objective:    Pt admitted with brain tumor. He is from home with his daughter and grandson. Pt IADL. Hx; none PCP: [facility]                Action/Plan: Awaiting plans for possible surgery. CM following for d/c needs, physician orders.  Expected Discharge Date:  09/30/17               Expected Discharge Plan:     In-House Referral:     Discharge planning Services     Post Acute Care Choice:    Choice offered to:     DME Arranged:    DME Agency:     HH Arranged:    HH Agency:     Status of Service:  In process, will continue to follow  If discussed at Long Length of Stay Meetings, dates discussed:    Additional Comments:  Pollie Friar, RN 09/26/2017, 11:35 AM

## 2017-09-26 NOTE — Progress Notes (Addendum)
Subjective: Patient resting in bed, comfortable. Denies headache. No seizure activity since admission.  Continues on Decadron 4 mg IV every 6 hours and Keppra 500 mg IV every 6 hours.  CT scan of the chest/abdomen/pelvis did not reveal an obvious tumor or malignancy. Laboratories unremarkable.  Most likely his left frontal enhancing brain mass represents a primary brain tumor, most likely grade 3 or 4.  Objective: Vital signs in last 24 hours: Vitals:   09/26/17 0407 09/26/17 0750 09/26/17 1223 09/26/17 1540  BP: 122/75 124/70 133/74 122/64  Pulse: 68 67 77 64  Resp: 18 18 18 18   Temp: 98.2 F (36.8 C) 98.5 F (36.9 C) 97.8 F (36.6 C) 98.2 F (36.8 C)  TempSrc: Oral Oral Oral Oral  SpO2: 96% 96% 96% 97%  Weight:      Height:        Intake/Output from previous day: 03/13 0701 - 03/14 0700 In: 816.7 [I.V.:816.7] Out: 800 [Urine:800] Intake/Output this shift: Total I/O In: 560 [P.O.:560] Out: -   Physical Exam:  Awake and alert, fully oriented. Speech fluent.  Following commands with all 4 extremities.  Right hemiparesis  CBC Recent Labs    09/25/17 0834 09/26/17 0426  WBC 11.8* 9.4  HGB 13.1 12.4*  HCT 37.8* 37.2*  PLT 214 190   BMET Recent Labs    09/25/17 0834 09/26/17 0426  NA 139 139  K 3.4* 4.0  CL 104 108  CO2 22 22  GLUCOSE 192* 142*  BUN 11 11  CREATININE 0.95 0.81  CALCIUM 8.9 8.9    Studies/Results: Dg Chest 2 View  Result Date: 09/25/2017 CLINICAL DATA:  Seizure EXAM: CHEST - 2 VIEW COMPARISON:  May 03, 2016 FINDINGS: There is no edema or consolidation. The heart size and pulmonary vascularity are normal. No adenopathy. No pneumothorax. No bone lesions. IMPRESSION: No edema or consolidation. Electronically Signed   By: Lowella Grip III M.D.   On: 09/25/2017 09:09   Ct Head Wo Contrast  Result Date: 09/25/2017 CLINICAL DATA:  Seizure and fall out of bed. EXAM: CT HEAD WITHOUT CONTRAST CT CERVICAL SPINE WITHOUT CONTRAST TECHNIQUE:  Multidetector CT imaging of the head and cervical spine was performed following the standard protocol without intravenous contrast. Multiplanar CT image reconstructions of the cervical spine were also generated. COMPARISON:  None. FINDINGS: CT HEAD FINDINGS Brain: There is a possible 3.0 cm mass in the left frontal lobe with prominent surrounding vasogenic edema. Gray-white matter differentiation is preserved. Small amount of hyperdensity along the superior aspect of the mass may reflect hemorrhage. There is slight rightward shift of the anterior falx and effacement of the left lateral ventricle frontal horn. No hydrocephalus or extra-axial collection. Vascular: Atherosclerotic vascular calcification of the carotid siphons. No hyperdense vessel. Skull: Normal. Negative for fracture or focal lesion. Sinuses/Orbits: No acute finding. Other: None. CT CERVICAL SPINE FINDINGS Alignment: Reversal of the normal cervical lordosis. No traumatic malalignment. Skull base and vertebrae: No acute fracture. No primary bone lesion or focal pathologic process. Soft tissues and spinal canal: No prevertebral fluid or swelling. No visible canal hematoma. Disc levels: Moderate to severe degenerative disc disease and facet uncovertebral hypertrophy throughout the cervical spine. Multilevel moderate to severe neuroforaminal stenoses. Upper chest: Negative. Other: None. IMPRESSION: 1. Probable 3.0 cm mass in the left frontal lobe with prominent surrounding vasogenic edema. Small amount of hyperdensity along the superior aspect of the mass likely reflects a hemorrhagic component. Findings less likely represent hemorrhagic infarct given relative preservation of the  gray-white differentiation. Recommend contrast-enhanced MRI of the brain for further evaluation. 2. No acute cervical spine fracture. Moderate to severe degenerative changes throughout the cervical spine. Critical Value/emergent results were called by telephone at the time of  interpretation on 09/25/2017 at 9:12 am to Dr. Isla Pence , who verbally acknowledged these results. Electronically Signed   By: Titus Dubin M.D.   On: 09/25/2017 09:17   Ct Chest W Contrast  Result Date: 09/25/2017 CLINICAL DATA:  Brain mass.  Evaluate for primary neoplasm. EXAM: CT CHEST, ABDOMEN, AND PELVIS WITH CONTRAST TECHNIQUE: Multidetector CT imaging of the chest, abdomen and pelvis was performed following the standard protocol during bolus administration of intravenous contrast. CONTRAST:  155mL ISOVUE-300 IOPAMIDOL (ISOVUE-300) INJECTION 61% COMPARISON:  None FINDINGS: CT CHEST FINDINGS Cardiovascular: The heart size appears within normal limits. Aortic atherosclerosis. No pericardial effusion. Mediastinum/Nodes: Normal appearance of the thyroid gland. The trachea appears patent and is midline. Normal appearance of the esophagus. No enlarged mediastinal or hilar lymph nodes. Lungs/Pleura: No pleural effusion. Mild subsegmental atelectasis or scar noted in the lung bases. No suspicious pulmonary nodule or mass identified. Musculoskeletal: No aggressive lytic or sclerotic bone lesions. CT ABDOMEN PELVIS FINDINGS Hepatobiliary: No focal liver abnormality is seen. No gallstones, gallbladder wall thickening, or biliary dilatation. Pancreas: Unremarkable. No pancreatic ductal dilatation or surrounding inflammatory changes. Spleen: Normal in size without focal abnormality. Adrenals/Urinary Tract: Normal appearance of the adrenal glands. The kidneys are both unremarkable. Urinary bladder appears normal. Stomach/Bowel: Stomach normal. The small bowel loops have a normal course and caliber. The appendix is visualized and appears normal. Unremarkable appearance of the colon. Vascular/Lymphatic: Aortic atherosclerosis. No aneurysm. No abdominal adenopathy. No pelvic or inguinal adenopathy. Reproductive: Prostate gland is unremarkable. Other: No free fluid or fluid collections. No peritoneal nodularity.  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 No aggressive lytic or sclerotic bone lesion. IMPRESSION: 1. No findings identified to indicate site of primary neoplastic process to explain brain mass. 2.  Aortic Atherosclerosis (ICD10-I70.0). Electronically Signed   By: Kerby Moors M.D.   On: 09/25/2017 14:53   Ct Cervical Spine Wo Contrast  Result Date: 09/25/2017 CLINICAL DATA:  Seizure and fall out of bed. EXAM: CT HEAD WITHOUT CONTRAST CT CERVICAL SPINE WITHOUT CONTRAST TECHNIQUE: Multidetector CT imaging of the head and cervical spine was performed following the standard protocol without intravenous contrast. Multiplanar CT image reconstructions of the cervical spine were also generated. COMPARISON:  None. FINDINGS: CT HEAD FINDINGS Brain: There is a possible 3.0 cm mass in the left frontal lobe with prominent surrounding vasogenic edema. Gray-white matter differentiation is preserved. Small amount of hyperdensity along the superior aspect of the mass may reflect hemorrhage. There is slight rightward shift of the anterior falx and effacement of the left lateral ventricle frontal horn. No hydrocephalus or extra-axial collection. Vascular: Atherosclerotic vascular calcification of the carotid siphons. No hyperdense vessel. Skull: Normal. Negative for fracture or focal lesion. Sinuses/Orbits: No acute finding. Other: None. CT CERVICAL SPINE FINDINGS Alignment: Reversal of the normal cervical lordosis. No traumatic malalignment. Skull base and vertebrae: No acute fracture. No primary bone lesion or focal pathologic process. Soft tissues and spinal canal: No prevertebral fluid or swelling. No visible canal hematoma. Disc levels: Moderate to severe degenerative disc disease and facet uncovertebral hypertrophy throughout the cervical spine. Multilevel moderate to severe neuroforaminal stenoses. Upper chest: Negative. Other: None. IMPRESSION: 1. Probable 3.0 cm mass in the left  frontal lobe with prominent surrounding vasogenic edema. Small amount  of hyperdensity along the superior aspect of the mass likely reflects a hemorrhagic component. Findings less likely represent hemorrhagic infarct given relative preservation of the gray-white differentiation. Recommend contrast-enhanced MRI of the brain for further evaluation. 2. No acute cervical spine fracture. Moderate to severe degenerative changes throughout the cervical spine. Critical Value/emergent results were called by telephone at the time of interpretation on 09/25/2017 at 9:12 am to Dr. Isla Pence , who verbally acknowledged these results. Electronically Signed   By: Titus Dubin M.D.   On: 09/25/2017 09:17   [name] UR Contrast  Result Date: 09/25/2017 CLINICAL DATA:  76 year old male found down by bed. Possible seizure, incontinent. Combative. EXAM: MRI HEAD WITHOUT AND WITH CONTRAST TECHNIQUE: Multiplanar, multiecho pulse sequences of the brain and surrounding structures were obtained without and with intravenous contrast. CONTRAST:  35mL MULTIHANCE GADOBENATE DIMEGLUMINE 529 MG/ML IV SOLN COMPARISON:  Head CT without contrast 0849 hours today. FINDINGS: Brain: Partially hemorrhagic mass involving the medial left superior frontal gyrus, and left cingulate gyrus. The mass encompasses 36 x 28 x 39 millimeters (AP by transverse by CC), is heterogeneously T2 hyperintense, and solidly enhancing. The medial margin of the mass does abut the interhemispheric fissure, but the lesion appears intra-axial on all sequences. There is a small volume of late subacute appearing hemorrhage within the central aspect of the mass (intrinsic T1 hyperintensity on series 14, image 37) with surrounding hemosiderin. There is associated blood product diffusion artifact. There is restricted diffusion along the peripheral margins of the mass suggesting hyper cellularity. There is confluent surrounding T2 and FLAIR hyperintensity in a pattern  suggesting vasogenic edema. There is mild regional mass effect, mostly on the anterior body of the left lateral ventricle. There is trace local rightward midline shift. There is no larger area of restricted diffusion about the mass. No diffusion restriction elsewhere. No ventriculomegaly. No extra-axial or intraventricular blood. Basilar cisterns are normal. No other abnormal intracranial enhancement is identified. No definite dural thickening. No other cerebral edema or mass effect. Outside of the anterior left frontal lobe there is mild for age symmetric bilateral nonspecific periventricular white matter T2 and FLAIR hyperintensity. No cortical encephalomalacia or definite chronic cerebral blood products. There is mild for age T2 heterogeneity in the bilateral deep gray matter nuclei. The brainstem and cerebellum appear normal. Cervicomedullary junction and pituitary are within normal limits. Vascular: Major intracranial vascular flow voids are preserved. Skull and upper cervical spine: Advanced degenerative changes in the visible cervical spine. Visualized bone marrow signal is within normal limits. Negative visible cervical spinal cord aside from mild degenerative stenosis. Sinuses/Orbits: Negative. Other: Visible internal auditory structures appear normal. Scalp and face soft tissues appear negative. IMPRESSION: 1. Solitary 3.9 cm intra-axial, enhancing, and partially hemorrhagic mass of the medial left superior frontal gyrus affecting the adjacent left cingulate gyrus. Surrounding vasogenic edema versus nonenhancing tumor. Mild regional mass effect. Top differential considerations are solitary metastasis and high-grade glioma. CNS lymphoma is less likely. 2. No superimposed infarct or other abnormal brain enhancement. 3. Mild for age signal changes in the cerebral white matter and deep gray matter compatible with chronic small vessel disease. Electronically Signed   By: Genevie Ann M.D.   On: 09/25/2017 12:39    Ct Abdomen Pelvis W Contrast  Result Date: 09/25/2017 CLINICAL DATA:  Brain mass.  Evaluate for primary neoplasm. EXAM: CT CHEST, ABDOMEN, AND PELVIS WITH CONTRAST TECHNIQUE: Multidetector CT imaging of the chest, abdomen and pelvis was performed following the standard protocol during  bolus administration of intravenous contrast. CONTRAST:  157mL ISOVUE-300 IOPAMIDOL (ISOVUE-300) INJECTION 61% COMPARISON:  None FINDINGS: CT CHEST FINDINGS Cardiovascular: The heart size appears within normal limits. Aortic atherosclerosis. No pericardial effusion. Mediastinum/Nodes: Normal appearance of the thyroid gland. The trachea appears patent and is midline. Normal appearance of the esophagus. No enlarged mediastinal or hilar lymph nodes. Lungs/Pleura: No pleural effusion. Mild subsegmental atelectasis or scar noted in the lung bases. No suspicious pulmonary nodule or mass identified. Musculoskeletal: No aggressive lytic or sclerotic bone lesions. CT ABDOMEN PELVIS FINDINGS Hepatobiliary: No focal liver abnormality is seen. No gallstones, gallbladder wall thickening, or biliary dilatation. Pancreas: Unremarkable. No pancreatic ductal dilatation or surrounding inflammatory changes. Spleen: Normal in size without focal abnormality. Adrenals/Urinary Tract: Normal appearance of the adrenal glands. The kidneys are both unremarkable. Urinary bladder appears normal. Stomach/Bowel: Stomach normal. The small bowel loops have a normal course and caliber. The appendix is visualized and appears normal. Unremarkable appearance of the colon. Vascular/Lymphatic: Aortic atherosclerosis. No aneurysm. No abdominal adenopathy. No pelvic or inguinal adenopathy. Reproductive: Prostate gland is unremarkable. Other: No free fluid or fluid collections. No peritoneal nodularity. 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 No aggressive lytic or sclerotic bone lesion. IMPRESSION: 1. No findings identified to  indicate site of primary neoplastic process to explain brain mass. 2.  Aortic Atherosclerosis (ICD10-I70.0). Electronically Signed   By: Kerby Moors M.D.   On: 09/25/2017 14:53    Assessment/Plan: Patient with generalized seizure yesterday found to have a enhancing 3 x 3.5 x 4 cm medial left frontal mass. Metastatic workup has not revealed a primary source and therefore this most likely represents a primary brain tumor. Patient was started and continues on dexamethasone IV, was loaded with Keppra and continues on Keppra IV.  I've reviewed the case with my partners and the consensus recommendation is for stereotactically guided craniotomy for tumor resection. Further adjuvant therapy based on the pathology findings.  For now I feel the patient should continue on IV Decadron to continue to stabilize the vasogenic cerebral edema.  We will schedule a CT of the brain without contrast tomorrow morning with [facility] stereotactic protocol, with the plan to proceed with surgery on  Monday, March 18.  I spoke with the patient and his family at the bedside, including his daughter [name] and his niece Levada Dy, reviewing the results of all of the studies and our recommendation to proceed with craniotomy for tumor resection. We discussed the nature primary brain tumors, and the expected need for adjuvant therapy postoperatively. The patient's and his family's questions were answered for them. They do want to proceed with surgery on March 18.   Hosie Spangle, MD 09/26/2017, 6:19 PM

## 2017-09-27 ENCOUNTER — Inpatient Hospital Stay (HOSPITAL_COMMUNITY): Payer: Medicare Other

## 2017-09-27 LAB — MRSA PCR SCREENING: MRSA BY PCR: NEGATIVE

## 2017-09-27 NOTE — Progress Notes (Signed)
Dr. Sherwood Gambler gave a verbal order that patient is independent, doesn't need to be on Fall precautions. Bed alarm turned off, changed yellow socks to blue socks and cut off his yellow arm band.

## 2017-09-27 NOTE — Progress Notes (Signed)
PROGRESS NOTE  [name] BPZ:[id number] DOB: 19-Aug-1940 DOA: 09/25/2017 PCP: No primary care provider on file.  HPI/Recap of past 24 hours: Christopher Morris is a 77 y.o. male with no reported medical hx and follows at [facility] . Family reports unsteady gait and difficulty talking for past several weeks.   MRI revealed left frontal mass with surrounding edema.  Neurosurgery consulted.  Started on  IV Decadron and Keppra.    09/26/2017: Patient seen and examined with his daughter at bedside.  He has no new complaints.  Denies any headache change in vision weakness or numbness.  He also denies chest pain or palpitations or dyspnea.  09/27/17: Patient seen and examined with his daughter at bedside.  He has no new complaints.  He denies dizziness, change in vision, nausea or headaches.  Neurosurgery is following.     Assessment/Plan: Principal Problem:   Brain tumor ([place]) Active Problems:   Seizure ([place])   Hyperglycemia   Leukocytosis   Hypokalemia  New onset seizure in the setting of newly diagnosed brain mass No new seizures reported Neurosurgery following Continue IV Decadron and Keppra Neurochecks every 4 hours Fall precaution CT abdomen pelvis with contrast revealed no findings to indicate site of primary neoplastic process to explain brain mass CT chest with contrast revealed no acute findings Neurosurgery anticipates recommending stereotactic craniotomy for surgical resection of the brain lesion possibly with proceeding preoperative stereotactic radiosurgery. CT head done this morning 09/27/2017 for stereotactic protocol/preoperative planning   Hypokalemia, resolved Potassium 4 from 3.4 Replete electrolytes as indicated  Code Status: Full  Family Communication: Daughter at bedside  Disposition Plan: Home when neurosurgery signs off.   Consultants:  Neurosurgery  Procedures:  None  Antimicrobials:  None  DVT prophylaxis: SCDs, has been  ambulating.   Objective: Vitals:   09/27/17 0000 09/27/17 0400 09/27/17 0754 09/27/17 1202  BP: (!) 118/58 139/68 139/68 129/63  Pulse: 66 67 62 67  Resp: 18 18 18 18   Temp: 98 F (36.7 C) 97.7 F (36.5 C) 98.6 F (37 C) 98.4 F (36.9 C)  TempSrc: Oral Oral Oral Oral  SpO2: 95% 98% 100% 98%  Weight:      Height:        Intake/Output Summary (Last 24 hours) at 09/27/2017 1537 Last data filed at 09/27/2017 1200 Gross per 24 hour  Intake 1470 ml  Output 900 ml  Net 570 ml   Filed Weights   09/25/17 0840 09/26/17 0011  Weight: 99.8 kg (220 lb) 93.3 kg (205 lb 11 oz)    Exam: 09/27/2017.  Patient seen and examined.  Physical exam is essentially the same from prior.  Except for what is mentioned below.   General: 77 year old African-American male well-developed well-nourished in no acute distress alert and oriented x3  Cardiovascular: Regular rate and rhythm with no rubs or gallops  Respiratory: Clear to auscultation with no wheezes or rales  Abdomen: Soft nontender nondistended normal bowel sounds x4  Musculoskeletal: Mild weakness on left lower extremity  Skin: No rash  Psychiatry: Mood is appropriate for condition setting  Data Reviewed: CBC: Recent Labs  Lab 09/25/17 0834 09/26/17 0426  WBC 11.8* 9.4  NEUTROABS 7.1  --   HGB 13.1 12.4*  HCT 37.8* 37.2*  MCV 98.4 98.7  PLT 214 242   Basic Metabolic Panel: Recent Labs  Lab 09/25/17 0834 09/26/17 0426  NA 139 139  K 3.4* 4.0  CL 104 108  CO2 22 22  GLUCOSE 192* 142*  BUN 11 11  CREATININE 0.95 0.81  CALCIUM 8.9 8.9  MG 2.0  --    GFR: Estimated Creatinine Clearance: 92.3 mL/min (by C-G formula based on SCr of 0.81 mg/dL). Liver Function Tests: Recent Labs  Lab 09/25/17 0834  AST 24  ALT 14*  ALKPHOS 67  BILITOT 1.2  PROT 6.5  ALBUMIN 3.9   No results for input(s): LIPASE, AMYLASE in the last 168 hours. No results for input(s): AMMONIA in the last 168 hours. Coagulation Profile: No  results for input(s): INR, PROTIME in the last 168 hours. Cardiac Enzymes: No results for input(s): CKTOTAL, CKMB, CKMBINDEX, TROPONINI in the last 168 hours. BNP (last 3 results) No results for input(s): PROBNP in the last 8760 hours. HbA1C: Recent Labs    09/25/17 1641  HGBA1C 5.6   CBG: No results for input(s): GLUCAP in the last 168 hours. Lipid Profile: No results for input(s): CHOL, HDL, LDLCALC, TRIG, CHOLHDL, LDLDIRECT in the last 72 hours. Thyroid Function Tests: No results for input(s): TSH, T4TOTAL, FREET4, T3FREE, THYROIDAB in the last 72 hours. Anemia Panel: No results for input(s): VITAMINB12, FOLATE, FERRITIN, TIBC, IRON, RETICCTPCT in the last 72 hours. Urine analysis:    Component Value Date/Time   COLORURINE YELLOW 09/25/2017 1410   APPEARANCEUR CLEAR 09/25/2017 1410   LABSPEC 1.024 09/25/2017 1410   PHURINE 8.0 09/25/2017 1410   GLUCOSEU NEGATIVE 09/25/2017 1410   HGBUR NEGATIVE 09/25/2017 1410   BILIRUBINUR NEGATIVE 09/25/2017 1410   KETONESUR 5 (A) 09/25/2017 1410   PROTEINUR NEGATIVE 09/25/2017 1410   NITRITE NEGATIVE 09/25/2017 1410   LEUKOCYTESUR NEGATIVE 09/25/2017 1410   Sepsis Labs: @LABRCNTIP (procalcitonin:4,lacticidven:4)  )No results found for this or any previous visit (from the past 240 hour(s)).    Studies: Ct Head Wo Contrast  Result Date: 09/27/2017 CLINICAL DATA:  First-time seizure. Intracranial mass lesion. Brain lab protocol prior to resection. EXAM: CT HEAD WITHOUT CONTRAST TECHNIQUE: Contiguous axial images were obtained from the base of the skull through the vertex without intravenous contrast. COMPARISON:  CT and MRI 09/25/2017 FINDINGS: Brain: [facility] protocol for operative utilization. Redemonstration of a necrotic mass in the medial left frontal vertex region with transverse diameter of 3.5 cm and cephalo caudal extent of 3.5-4 cm. Vasogenic edema in the left frontal lobe as seen previously. Small focus of hemorrhage along the  superior margin of the tumor. No evidence of increased bleeding. No second lesion is evident. Mass effect results an left-to-right shift of 2 mm. No hydrocephalus. Vascular: There is atherosclerotic calcification of the major vessels at the base of the brain. Skull: Negative Sinuses/Orbits: Clear/normal Other: None IMPRESSION: Brain[facility] protocol for operative utilization. Necrotic left posterior frontal vertex mass 3.5-4 cm in diameter as seen previously. Edema pattern is the same. Small amount of hemorrhage within the superior brim of the tumor is the same. Mass-effect is the same, with left-to-right shift of 2 mm. Electronically Signed   By: Nelson Chimes M.D.   On: 09/27/2017 09:06    Scheduled Meds: . dexamethasone  4 mg Intravenous Q6H  . levETIRAcetam  500 mg Oral Q12H    Continuous Infusions: . sodium chloride 75 mL/hr at 09/25/17 2308     LOS: 2 days     Kayleen Memos, MD [facility] Pager [phone]  If 7PM-7AM, please contact night-coverage [email or web address] Password [facility] 09/27/2017, 3:37 PM

## 2017-09-27 NOTE — Progress Notes (Signed)
Vitals:   09/26/17 2000 09/27/17 0000 09/27/17 0400 09/27/17 0754  BP: 138/73 (!) 118/58 139/68 139/68  Pulse: 72 66 67 62  Resp: 18 18 18 18   Temp: 97.9 F (36.6 C) 98 F (36.7 C) 97.7 F (36.5 C) 98.6 F (37 C)  TempSrc: Oral Oral Oral Oral  SpO2: 99% 95% 98% 100%  Weight:      Height:        CBC Recent Labs    09/25/17 0834 09/26/17 0426  WBC 11.8* 9.4  HGB 13.1 12.4*  HCT 37.8* 37.2*  PLT 214 190   BMET Recent Labs    09/25/17 0834 09/26/17 0426  NA 139 139  K 3.4* 4.0  CL 104 108  CO2 22 22  GLUCOSE 192* 142*  BUN 11 11  CREATININE 0.95 0.81  CALCIUM 8.9 8.9    Patient sitting up on the side of the bed, eating breakfast. Has been ambulating in the halls. For stereotactic protocol CT of the head today for preoperative planning.  Awake and alert, oriented. Following commands.  Right hemiparesis.  Preoperative orders placed including request for consent. Spoke with the patient and his family last evening about surgery and associated risks. For OR on Monday, March 18.  Plan: Stereotactic protocol CT of head today. Updated labs on March 17. OR March 18.  Encouraged to ambulate in the halls with family and staff.  Hosie Spangle, MD 09/27/2017, 8:27 AM

## 2017-09-27 NOTE — Progress Notes (Signed)
   09/27/17 1600  Clinical Encounter Type  Visited With Family  Visit Type Initial  Referral From Nurse  Consult/Referral To Chaplain  Spiritual Encounters  Spiritual Needs Brochure  Stress Factors  Patient Stress Factors Exhausted   I visited with daughter who wanted to know more about the AD that she had completed day before. Chaplain discussed more about daughter's concerns. Pt' s daughter was very Patent attorney and receptive. Chaplain provided answers and compassionate presence.Emylia Latella a Medical sales representative, [employer]

## 2017-09-28 DIAGNOSIS — D72829 Elevated white blood cell count, unspecified: Secondary | ICD-10-CM

## 2017-09-28 LAB — BASIC METABOLIC PANEL
Anion gap: 9 (ref 5–15)
BUN: 19 mg/dL (ref 6–20)
CO2: 25 mmol/L (ref 22–32)
CREATININE: 0.84 mg/dL (ref 0.61–1.24)
Calcium: 8.8 mg/dL — ABNORMAL LOW (ref 8.9–10.3)
Chloride: 105 mmol/L (ref 101–111)
GFR calc Af Amer: >60 mL/min (ref >60)
GFR calc non Af Amer: >60 mL/min (ref >60)
Glucose, Bld: 141 mg/dL — ABNORMAL HIGH (ref 65–99)
Potassium: 3.9 mmol/L (ref 3.5–5.1)
Sodium: 139 mmol/L (ref 135–145)

## 2017-09-28 LAB — CBC
HEMATOCRIT: 35.5 % — AB (ref 39.0–52.0)
Hemoglobin: 12 g/dL — ABNORMAL LOW (ref 13.0–17.0)
MCH: 32.9 pg (ref 26.0–34.0)
MCHC: 33.8 g/dL (ref 30.0–36.0)
MCV: 97.3 fL (ref 78.0–100.0)
Platelets: 193 10*3/uL (ref 150–400)
RBC: 3.65 MIL/uL — ABNORMAL LOW (ref 4.22–5.81)
RDW: 12.4 % (ref 11.5–15.5)
WBC: 11.9 10*3/uL — ABNORMAL HIGH (ref 4.0–10.5)

## 2017-09-28 MED ORDER — POTASSIUM CHLORIDE IN NACL 20-0.9 MEQ/L-% IV SOLN
INTRAVENOUS | Status: DC
  Administered 2017-09-28: 10:00:00 via INTRAVENOUS
  Filled 2017-09-28 (×3): qty 1000

## 2017-09-28 NOTE — Progress Notes (Signed)
Vitals:   09/27/17 2000 09/28/17 0000 09/28/17 0400 09/28/17 0801  BP: 121/68 129/74 117/61 124/64  Pulse: (!) 59 (!) 58 (!) 55 (!) 58  Resp: 18 18 18 18   Temp: 97.8 F (36.6 C) 98.6 F (37 C) 97.9 F (36.6 C) 97.7 F (36.5 C)  TempSrc: Oral Oral Oral Oral  SpO2: 97% 99% 98% 98%  Weight:      Height:        CBC Recent Labs    09/26/17 0426 09/28/17 0352  WBC 9.4 11.9*  HGB 12.4* 12.0*  HCT 37.2* 35.5*  PLT 190 193   BMET Recent Labs    09/26/17 0426 09/28/17 0352  NA 139 139  K 4.0 3.9  CL 108 105  CO2 22 25  GLUCOSE 142* 141*  BUN 11 19  CREATININE 0.81 0.84  CALCIUM 8.9 8.8*    Patient resting comfortably in bed, denies headache. Had preoperative stereotactic CT of the brain without contrast yesterday. Preoperative stereotactic planning underway. Scheduled for surgery on Monday, March 18. Preoperative laboratories (CBC with differential, bmet, type and screen) scheduled for tomorrow morning. Scheduled nothing by mouth at midnight tomorrow night, with IV fluids (normal saline with 20 mEq KCl per liter and 75 mL per hour) to start when he becomes nothing by mouth. Consent signed.  Awake and alert, oriented. Following commands. Speech fluent. Moderate right hemiparesis persists:  Moderate drift of the right upper extremity, slightly milder weakness of the right lower extremity.  Spoke with the patient and his daughter Christopher Morris who was at the bedside, and answered their questions.  Plan: For OR in 2 days.  Preoperative orders in. Encouraged to ambulate in the halls at least 6 times per day. Continues on Decadron 4 mg IV every 6 hours and Keppra 500 mg by mouth every 12 hours.  Hosie Spangle, MD 09/28/2017, 9:28 AM

## 2017-09-28 NOTE — Progress Notes (Signed)
Patient ID: Christopher Morris, male   DOB: 04/04/1941, 77 y.o.   MRN: [id number]  PROGRESS NOTE    Jaycion Treml  VPX:[id number] DOB: January 08, 1941 DOA: 09/25/2017 PCP: No primary care provider on file.   Brief Narrative:  77 year old male with no past medical history presented with unsteady gait and difficulty talking for past several weeks and witnessed seizure on the day of presentation on 09/25/2017.  MRI revealed left frontal mass with surrounding edema.  Neurosurgery was consulted.  Started on intravenous Decadron and Keppra.   Assessment & Plan:   Principal Problem:   Brain tumor ([place]) Active Problems:   Seizure ([place])   Hyperglycemia   Leukocytosis   Hypokalemia  Newly diagnosed left frontal mass with surrounding edema -Neurosurgery following.  For probable OR on 09/30/2017 -Continue Decadron -CT chest, abdomen and pelvis did not show any abnormality  Newly diagnosed seizures probably secondary to above -Continue Keppra.  Currently seizure-free  Leukocytosis -Probably reactive from steroids.  Repeat a.m. Labs  Hypokalemia Resolved    DVT prophylaxis: SCDs Code Status: Full Family Communication: Spoke with daughter at bedside Disposition Plan: Depends on clinical outcome  Consultants: Neurosurgery  Procedures: None  Antimicrobials: None   Subjective: Patient seen and examined at bedside.  He denies overnight fever, nausea, vomiting or headache.  Objective: Vitals:   09/27/17 2000 09/28/17 0000 09/28/17 0400 09/28/17 0801  BP: 121/68 129/74 117/61 124/64  Pulse: (!) 59 (!) 58 (!) 55 (!) 58  Resp: 18 18 18 18   Temp: 97.8 F (36.6 C) 98.6 F (37 C) 97.9 F (36.6 C) 97.7 F (36.5 C)  TempSrc: Oral Oral Oral Oral  SpO2: 97% 99% 98% 98%  Weight:      Height:        Intake/Output Summary (Last 24 hours) at 09/28/2017 1027 Last data filed at 09/28/2017 0400 Gross per 24 hour  Intake 360 ml  Output 200 ml  Net 160 ml   Filed Weights   09/25/17 0840  09/26/17 0011  Weight: 99.8 kg (220 lb) 93.3 kg (205 lb 11 oz)    Examination:  General exam: Appears calm and comfortable  Respiratory system: Bilateral decreased breath sound at bases Cardiovascular system: S1 & S2 heard, rate controlled  gastrointestinal system: Abdomen is nondistended, soft and nontender. Normal bowel sounds heard. Extremities: No cyanosis, clubbing, edema    Data Reviewed: I have personally reviewed following labs and imaging studies  CBC: Recent Labs  Lab 09/25/17 0834 09/26/17 0426 09/28/17 0352  WBC 11.8* 9.4 11.9*  NEUTROABS 7.1  --   --   HGB 13.1 12.4* 12.0*  HCT 37.8* 37.2* 35.5*  MCV 98.4 98.7 97.3  PLT 214 190 462   Basic Metabolic Panel: Recent Labs  Lab 09/25/17 0834 09/26/17 0426 09/28/17 0352  NA 139 139 139  K 3.4* 4.0 3.9  CL 104 108 105  CO2 22 22 25   GLUCOSE 192* 142* 141*  BUN 11 11 19   CREATININE 0.95 0.81 0.84  CALCIUM 8.9 8.9 8.8*  MG 2.0  --   --    GFR: Estimated Creatinine Clearance: 89 mL/min (by C-G formula based on SCr of 0.84 mg/dL). Liver Function Tests: Recent Labs  Lab 09/25/17 0834  AST 24  ALT 14*  ALKPHOS 67  BILITOT 1.2  PROT 6.5  ALBUMIN 3.9   No results for input(s): LIPASE, AMYLASE in the last 168 hours. No results for input(s): AMMONIA in the last 168 hours. Coagulation Profile: No results for input(s): INR, PROTIME  in the last 168 hours. Cardiac Enzymes: No results for input(s): CKTOTAL, CKMB, CKMBINDEX, TROPONINI in the last 168 hours. BNP (last 3 results) No results for input(s): PROBNP in the last 8760 hours. HbA1C: Recent Labs    09/25/17 1641  HGBA1C 5.6   CBG: No results for input(s): GLUCAP in the last 168 hours. Lipid Profile: No results for input(s): CHOL, HDL, LDLCALC, TRIG, CHOLHDL, LDLDIRECT in the last 72 hours. Thyroid Function Tests: No results for input(s): TSH, T4TOTAL, FREET4, T3FREE, THYROIDAB in the last 72 hours. Anemia Panel: No results for input(s):  VITAMINB12, FOLATE, FERRITIN, TIBC, IRON, RETICCTPCT in the last 72 hours. Sepsis Labs: No results for input(s): PROCALCITON, LATICACIDVEN in the last 168 hours.  Recent Results (from the past 240 hour(s))  MRSA PCR Screening     Status: None   Collection Time: 09/27/17  4:47 PM  Result Value Ref Range Status   MRSA by PCR NEGATIVE NEGATIVE Final    Comment:        The GeneXpert MRSA Assay (FDA approved for NASAL specimens only), is one component of a comprehensive MRSA colonization surveillance program. It is not intended to diagnose MRSA infection nor to guide or monitor treatment for MRSA infections. Performed at [facility], [place] [address] [place], [place] [address]          Radiology Studies: Ct Head Wo Contrast  Result Date: 09/27/2017 CLINICAL DATA:  First-time seizure. Intracranial mass lesion. Brain lab protocol prior to resection. EXAM: CT HEAD WITHOUT CONTRAST TECHNIQUE: Contiguous axial images were obtained from the base of the skull through the vertex without intravenous contrast. COMPARISON:  CT and MRI 09/25/2017 FINDINGS: Brain: BrainLAB protocol for operative utilization. Redemonstration of a necrotic mass in the medial left frontal vertex region with transverse diameter of 3.5 cm and cephalo caudal extent of 3.5-4 cm. Vasogenic edema in the left frontal lobe as seen previously. Small focus of hemorrhage along the superior margin of the tumor. No evidence of increased bleeding. No second lesion is evident. Mass effect results an left-to-right shift of 2 mm. No hydrocephalus. Vascular: There is atherosclerotic calcification of the major vessels at the base of the brain. Skull: Negative Sinuses/Orbits: Clear/normal Other: None IMPRESSION: BrainLAB protocol for operative utilization. Necrotic left posterior frontal vertex mass 3.5-4 cm in diameter as seen previously. Edema pattern is the same. Small amount of hemorrhage within the superior brim of the tumor is the  same. Mass-effect is the same, with left-to-right shift of 2 mm. Electronically Signed   By: Nelson Chimes M.D.   On: 09/27/2017 09:06        Scheduled Meds: . dexamethasone  4 mg Intravenous Q6H  . levETIRAcetam  500 mg Oral Q12H   Continuous Infusions: . sodium chloride 75 mL/hr at 09/25/17 2308  . 0.9 % NaCl with KCl 20 mEq / L 75 mL/hr at 09/28/17 1022     LOS: 3 days        [name], MD [facility] Pager [phone]  If 7PM-7AM, please contact night-coverage [email or web address] Password [facility] 09/28/2017, 10:27 AM

## 2017-09-29 LAB — TYPE AND SCREEN
ABO/RH(D): B POS
Antibody Screen: NEGATIVE

## 2017-09-29 LAB — BASIC METABOLIC PANEL
Anion gap: 7 (ref 5–15)
BUN: 18 mg/dL (ref 6–20)
CO2: 27 mmol/L (ref 22–32)
Calcium: 8.7 mg/dL — ABNORMAL LOW (ref 8.9–10.3)
Chloride: 105 mmol/L (ref 101–111)
Creatinine, Ser: 0.87 mg/dL (ref 0.61–1.24)
GFR calc Af Amer: >60 mL/min (ref >60)
GFR calc non Af Amer: >60 mL/min (ref >60)
Glucose, Bld: 137 mg/dL — ABNORMAL HIGH (ref 65–99)
Potassium: 4.6 mmol/L (ref 3.5–5.1)
Sodium: 139 mmol/L (ref 135–145)

## 2017-09-29 LAB — CBC WITH DIFFERENTIAL/PLATELET
Basophils Absolute: 0 10*3/uL (ref 0.0–0.1)
Basophils Relative: 0 %
Eosinophils Absolute: 0 10*3/uL (ref 0.0–0.7)
Eosinophils Relative: 0 %
HCT: 36.8 % — ABNORMAL LOW (ref 39.0–52.0)
Hemoglobin: 12.7 g/dL — ABNORMAL LOW (ref 13.0–17.0)
Lymphocytes Relative: 7 %
Lymphs Abs: 0.8 10*3/uL (ref 0.7–4.0)
MCH: 33.5 pg (ref 26.0–34.0)
MCHC: 34.5 g/dL (ref 30.0–36.0)
MCV: 97.1 fL (ref 78.0–100.0)
Monocytes Absolute: 0.5 10*3/uL (ref 0.1–1.0)
Monocytes Relative: 4 %
Neutro Abs: 9.9 10*3/uL — ABNORMAL HIGH (ref 1.7–7.7)
Neutrophils Relative %: 89 %
Platelets: 192 10*3/uL (ref 150–400)
RBC: 3.79 MIL/uL — ABNORMAL LOW (ref 4.22–5.81)
RDW: 12.4 % (ref 11.5–15.5)
WBC: 11.1 10*3/uL — ABNORMAL HIGH (ref 4.0–10.5)

## 2017-09-29 LAB — MAGNESIUM: MAGNESIUM: 2.2 mg/dL (ref 1.7–2.4)

## 2017-09-29 LAB — ABO/RH: ABO/RH(D): B POS

## 2017-09-29 NOTE — Progress Notes (Signed)
Patient ID: Christopher Morris, male   DOB: [birth year], 77 y.o.   MRN: [id number]  PROGRESS NOTE    Christopher Morris  VPX:[id number] DOB: 04/22/1941 DOA: 09/25/2017 PCP: No primary care provider on file.   Brief Narrative:  77 year old male with no past medical history presented with unsteady gait and difficulty talking for past several weeks and witnessed seizure on the day of presentation on 09/25/2017.  MRI revealed left frontal mass with surrounding edema.  Neurosurgery was consulted.  Started on intravenous Decadron and Keppra.   Assessment & Plan:   Principal Problem:   Brain tumor ([place]) Active Problems:   Seizure ([place])   Hyperglycemia   Leukocytosis   Hypokalemia  Newly diagnosed left frontal mass with surrounding edema -Neurosurgery following.  For OR on 09/30/2017 -Continue Decadron -CT chest, abdomen and pelvis did not show any abnormality  Newly diagnosed seizures probably secondary to above -Continue Keppra.  Currently seizure-free  Leukocytosis -Probably reactive from steroids.  Repeat a.m. Labs  Hypokalemia Resolved    DVT prophylaxis: SCDs Code Status: Full Family Communication: Spoke with daughter at bedside Disposition Plan: Depends on clinical outcome  Consultants: Neurosurgery  Procedures: None  Antimicrobials: None   Subjective: Patient seen and examined at bedside.  No new complaints.  No overnight worsening fever or headache. Objective: Vitals:   09/28/17 2113 09/29/17 0046 09/29/17 0418 09/29/17 0845  BP: 126/64 116/65 117/71 124/65  Pulse: 60 (!) 51 (!) 54 61  Resp: 17 18 18 17   Temp: 98.3 F (36.8 C) 98.7 F (37.1 C) 98.6 F (37 C) 98.6 F (37 C)  TempSrc: Oral Oral Oral Oral  SpO2: 100% 96% 95% 97%  Weight:      Height:        Intake/Output Summary (Last 24 hours) at 09/29/2017 1029 Last data filed at 09/28/2017 1700 Gross per 24 hour  Intake 547.5 ml  Output -  Net 547.5 ml   Filed Weights   09/25/17 0840 09/26/17 0011    Weight: 99.8 kg (220 lb) 93.3 kg (205 lb 11 oz)    Examination:  General exam: Appears calm and comfortable  Respiratory system: Bilateral decreased breath sounds at bases  cardiovascular system: Rate controlled, S1-S2 positive    Data Reviewed: I have personally reviewed following labs and imaging studies  CBC: Recent Labs  Lab 09/25/17 0834 09/26/17 0426 09/28/17 0352 09/29/17 0629  WBC 11.8* 9.4 11.9* 11.1*  NEUTROABS 7.1  --   --  9.9*  HGB 13.1 12.4* 12.0* 12.7*  HCT 37.8* 37.2* 35.5* 36.8*  MCV 98.4 98.7 97.3 97.1  PLT 214 190 193 462   Basic Metabolic Panel: Recent Labs  Lab 09/25/17 0834 09/26/17 0426 09/28/17 0352 09/29/17 0629  NA 139 139 139 139  K 3.4* 4.0 3.9 4.6  CL 104 108 105 105  CO2 22 22 25 27   GLUCOSE 192* 142* 141* 137*  BUN 11 11 19 18   CREATININE 0.95 0.81 0.84 0.87  CALCIUM 8.9 8.9 8.8* 8.7*  MG 2.0  --   --  2.2   GFR: Estimated Creatinine Clearance: 85.9 mL/min (by C-G formula based on SCr of 0.87 mg/dL). Liver Function Tests: Recent Labs  Lab 09/25/17 0834  AST 24  ALT 14*  ALKPHOS 67  BILITOT 1.2  PROT 6.5  ALBUMIN 3.9   No results for input(s): LIPASE, AMYLASE in the last 168 hours. No results for input(s): AMMONIA in the last 168 hours. Coagulation Profile: No results for input(s): INR, PROTIME in the  last 168 hours. Cardiac Enzymes: No results for input(s): CKTOTAL, CKMB, CKMBINDEX, TROPONINI in the last 168 hours. BNP (last 3 results) No results for input(s): PROBNP in the last 8760 hours. HbA1C: No results for input(s): HGBA1C in the last 72 hours. CBG: No results for input(s): GLUCAP in the last 168 hours. Lipid Profile: No results for input(s): CHOL, HDL, LDLCALC, TRIG, CHOLHDL, LDLDIRECT in the last 72 hours. Thyroid Function Tests: No results for input(s): TSH, T4TOTAL, FREET4, T3FREE, THYROIDAB in the last 72 hours. Anemia Panel: No results for input(s): VITAMINB12, FOLATE, FERRITIN, TIBC, IRON,  RETICCTPCT in the last 72 hours. Sepsis Labs: No results for input(s): PROCALCITON, LATICACIDVEN in the last 168 hours.  Recent Results (from the past 240 hour(s))  MRSA PCR Screening     Status: None   Collection Time: 09/27/17  4:47 PM  Result Value Ref Range Status   MRSA by PCR NEGATIVE NEGATIVE Final    Comment:        The GeneXpert MRSA Assay (FDA approved for NASAL specimens only), is one component of a comprehensive MRSA colonization surveillance program. It is not intended to diagnose MRSA infection nor to guide or monitor treatment for MRSA infections. Performed at [facility], [place] [address] [place], [place] [address]          Radiology Studies: No results found.      Scheduled Meds: . dexamethasone  4 mg Intravenous Q6H  . levETIRAcetam  500 mg Oral Q12H   Continuous Infusions: . sodium chloride 75 mL/hr at 09/25/17 2308  . 0.9 % NaCl with KCl 20 mEq / L 75 mL/hr at 09/28/17 1022     LOS: 4 days        [name], MD [facility] Pager [phone]  If 7PM-7AM, please contact night-coverage [email or web address] Password [facility] 09/29/2017, 10:29 AM

## 2017-09-29 NOTE — Progress Notes (Signed)
NEUROSURGERY PROGRESS NOTE  Doing well. No complaints right now. Still has some right sided weakness. Denies any headaches, NV.  Temp:  [97.7 F (36.5 C)-98.7 F (37.1 C)] 98.6 F (37 C) (03/17 0418) Pulse Rate:  [51-60] 54 (03/17 0418) Resp:  [16-18] 18 (03/17 0418) BP: (116-127)/(63-71) 117/71 (03/17 0418) SpO2:  [95 %-100 %] 95 % (03/17 0418)  Plan: Preop labs have been ordered. Scheduled for surgery Monday.   Eleonore Chiquito, NP 09/29/2017 6:39 AM

## 2017-09-30 ENCOUNTER — Inpatient Hospital Stay (HOSPITAL_COMMUNITY): Payer: Medicare Other | Admitting: Certified Registered Nurse Anesthetist

## 2017-09-30 ENCOUNTER — Encounter (HOSPITAL_COMMUNITY)
Admission: EM | Disposition: A | Discharge status: Skilled Nursing Facility | Payer: Self-pay | Source: Home / Self Care | Attending: Internal Medicine

## 2017-09-30 ENCOUNTER — Encounter (HOSPITAL_COMMUNITY): Payer: Self-pay | Admitting: Certified Registered Nurse Anesthetist

## 2017-09-30 HISTORY — PX: CRANIOTOMY: SHX93

## 2017-09-30 HISTORY — PX: APPLICATION OF CRANIAL NAVIGATION: SHX6578

## 2017-09-30 LAB — CBC WITH DIFFERENTIAL/PLATELET
Basophils Absolute: 0 10*3/uL (ref 0.0–0.1)
Basophils Relative: 0 %
Eosinophils Absolute: 0 10*3/uL (ref 0.0–0.7)
Eosinophils Relative: 0 %
HCT: 39.3 % (ref 39.0–52.0)
HEMOGLOBIN: 13.6 g/dL (ref 13.0–17.0)
LYMPHS ABS: 0.8 10*3/uL (ref 0.7–4.0)
LYMPHS PCT: 7 %
MCH: 33.6 pg (ref 26.0–34.0)
MCHC: 34.6 g/dL (ref 30.0–36.0)
MCV: 97 fL (ref 78.0–100.0)
MONOS PCT: 4 %
Monocytes Absolute: 0.5 10*3/uL (ref 0.1–1.0)
NEUTROS PCT: 89 %
Neutro Abs: 9.6 10*3/uL — ABNORMAL HIGH (ref 1.7–7.7)
Platelets: 205 10*3/uL (ref 150–400)
RBC: 4.05 MIL/uL — AB (ref 4.22–5.81)
RDW: 12.3 % (ref 11.5–15.5)
WBC: 10.8 10*3/uL — AB (ref 4.0–10.5)

## 2017-09-30 LAB — BASIC METABOLIC PANEL
Anion gap: 8 (ref 5–15)
BUN: 17 mg/dL (ref 6–20)
CHLORIDE: 102 mmol/L (ref 101–111)
CO2: 27 mmol/L (ref 22–32)
Calcium: 8.6 mg/dL — ABNORMAL LOW (ref 8.9–10.3)
Creatinine, Ser: 0.87 mg/dL (ref 0.61–1.24)
GFR calc Af Amer: >60 mL/min (ref >60)
GFR calc non Af Amer: >60 mL/min (ref >60)
GLUCOSE: 147 mg/dL — AB (ref 65–99)
Potassium: 4.9 mmol/L (ref 3.5–5.1)
Sodium: 137 mmol/L (ref 135–145)

## 2017-09-30 LAB — GLUCOSE, CAPILLARY: Glucose-Capillary: 143 mg/dL — ABNORMAL HIGH (ref 65–99)

## 2017-09-30 LAB — MAGNESIUM: MAGNESIUM: 2.1 mg/dL (ref 1.7–2.4)

## 2017-09-30 SURGERY — CRANIOTOMY TUMOR EXCISION
Anesthesia: General

## 2017-09-30 MED ORDER — LIDOCAINE-EPINEPHRINE 1 %-1:100000 IJ SOLN
INTRAMUSCULAR | Status: AC
  Filled 2017-09-30: qty 1

## 2017-09-30 MED ORDER — LEVETIRACETAM IN NACL 500 MG/100ML IV SOLN
500.0000 mg | Freq: Two times a day (BID) | INTRAVENOUS | Status: DC
  Administered 2017-09-30 – 2017-10-01 (×2): 500 mg via INTRAVENOUS
  Filled 2017-09-30 (×2): qty 100

## 2017-09-30 MED ORDER — MEPERIDINE HCL 50 MG/ML IJ SOLN: 6.2500 mg | INTRAMUSCULAR | Status: DC | PRN

## 2017-09-30 MED ORDER — PHENYLEPHRINE 40 MCG/ML (10ML) SYRINGE FOR IV PUSH (FOR BLOOD PRESSURE SUPPORT)
PREFILLED_SYRINGE | INTRAVENOUS | Status: AC
  Filled 2017-09-30: qty 10

## 2017-09-30 MED ORDER — LABETALOL HCL 5 MG/ML IV SOLN
5.0000 mg | INTRAVENOUS | Status: DC | PRN
  Administered 2017-09-30: 10 mg via INTRAVENOUS
  Filled 2017-09-30: qty 4

## 2017-09-30 MED ORDER — METHYLENE BLUE 0.5 % INJ SOLN
INTRAVENOUS | Status: DC | PRN
  Administered 2017-09-30: 1 mL

## 2017-09-30 MED ORDER — HYDRALAZINE HCL 20 MG/ML IJ SOLN
5.0000 mg | INTRAMUSCULAR | Status: DC | PRN
  Administered 2017-10-01 (×2): 10 mg via INTRAVENOUS
  Filled 2017-09-30 (×2): qty 1

## 2017-09-30 MED ORDER — SODIUM CHLORIDE 0.9 % IV SOLN
0.0500 ug/kg/min | INTRAVENOUS | Status: DC
  Filled 2017-09-30: qty 5000

## 2017-09-30 MED ORDER — 0.9 % SODIUM CHLORIDE (POUR BTL) OPTIME
TOPICAL | Status: DC | PRN
  Administered 2017-09-30 (×2): 1000 mL

## 2017-09-30 MED ORDER — MORPHINE SULFATE (PF) 4 MG/ML IV SOLN: 4.0000 mg | INTRAVENOUS | Status: DC | PRN

## 2017-09-30 MED ORDER — PROPOFOL 10 MG/ML IV BOLUS
INTRAVENOUS | Status: DC | PRN
  Administered 2017-09-30 (×2): 50 mg via INTRAVENOUS
  Administered 2017-09-30: 170 mg via INTRAVENOUS

## 2017-09-30 MED ORDER — SUGAMMADEX SODIUM 200 MG/2ML IV SOLN
INTRAVENOUS | Status: DC | PRN
  Administered 2017-09-30: 300 mg via INTRAVENOUS

## 2017-09-30 MED ORDER — FENTANYL CITRATE (PF) 100 MCG/2ML IJ SOLN
INTRAMUSCULAR | Status: DC | PRN
  Administered 2017-09-30 (×2): 75 ug via INTRAVENOUS

## 2017-09-30 MED ORDER — PROMETHAZINE HCL 25 MG/ML IJ SOLN: 6.2500 mg | INTRAMUSCULAR | Status: DC | PRN

## 2017-09-30 MED ORDER — METHYLENE BLUE 0.5 % INJ SOLN
INTRAVENOUS | Status: AC
  Filled 2017-09-30: qty 10

## 2017-09-30 MED ORDER — THROMBIN 20000 UNITS EX SOLR
CUTANEOUS | Status: AC
  Filled 2017-09-30: qty 20000

## 2017-09-30 MED ORDER — ROCURONIUM BROMIDE 100 MG/10ML IV SOLN
INTRAVENOUS | Status: DC | PRN
  Administered 2017-09-30: 30 mg via INTRAVENOUS
  Administered 2017-09-30: 60 mg via INTRAVENOUS
  Administered 2017-09-30: 10 mg via INTRAVENOUS

## 2017-09-30 MED ORDER — LIDOCAINE HCL (CARDIAC) 20 MG/ML IV SOLN
INTRAVENOUS | Status: DC | PRN
  Administered 2017-09-30: 50 mg via INTRAVENOUS

## 2017-09-30 MED ORDER — LIDOCAINE-EPINEPHRINE 1 %-1:100000 IJ SOLN
INTRAMUSCULAR | Status: DC | PRN
  Administered 2017-09-30: 10 mL

## 2017-09-30 MED ORDER — LIDOCAINE HCL (CARDIAC) 20 MG/ML IV SOLN
INTRAVENOUS | Status: AC
  Filled 2017-09-30: qty 5

## 2017-09-30 MED ORDER — PHENYLEPHRINE HCL 10 MG/ML IJ SOLN
INTRAMUSCULAR | Status: DC | PRN
  Administered 2017-09-30: 120 ug via INTRAVENOUS

## 2017-09-30 MED ORDER — FENTANYL CITRATE (PF) 100 MCG/2ML IJ SOLN: 25.0000 ug | INTRAMUSCULAR | Status: DC | PRN

## 2017-09-30 MED ORDER — CHLORHEXIDINE GLUCONATE CLOTH 2 % EX PADS: 6.0000 | MEDICATED_PAD | Freq: Once | CUTANEOUS | Status: DC

## 2017-09-30 MED ORDER — SODIUM CHLORIDE 0.9 % IV SOLN
INTRAVENOUS | Status: DC
  Administered 2017-09-30 (×2): via INTRAVENOUS

## 2017-09-30 MED ORDER — BUPIVACAINE HCL (PF) 0.5 % IJ SOLN
INTRAMUSCULAR | Status: DC | PRN
  Administered 2017-09-30: 10 mL

## 2017-09-30 MED ORDER — BUPIVACAINE HCL (PF) 0.25 % IJ SOLN
INTRAMUSCULAR | Status: AC
  Filled 2017-09-30: qty 30

## 2017-09-30 MED ORDER — DEXAMETHASONE SODIUM PHOSPHATE 10 MG/ML IJ SOLN
INTRAMUSCULAR | Status: AC
  Filled 2017-09-30: qty 2

## 2017-09-30 MED ORDER — LACTATED RINGERS IV SOLN: INTRAVENOUS | Status: DC

## 2017-09-30 MED ORDER — HEMOSTATIC AGENTS (NO CHARGE) OPTIME
TOPICAL | Status: DC | PRN
  Administered 2017-09-30: 1 via TOPICAL

## 2017-09-30 MED ORDER — HYDROCODONE-ACETAMINOPHEN 5-325 MG PO TABS
1.0000 | ORAL_TABLET | ORAL | Status: DC | PRN
  Administered 2017-09-30: 1 via ORAL
  Filled 2017-09-30: qty 1

## 2017-09-30 MED ORDER — ONDANSETRON HCL 4 MG/2ML IJ SOLN
INTRAMUSCULAR | Status: DC | PRN
  Administered 2017-09-30: 4 mg via INTRAVENOUS

## 2017-09-30 MED ORDER — PROPOFOL 10 MG/ML IV BOLUS
INTRAVENOUS | Status: AC
  Filled 2017-09-30: qty 20

## 2017-09-30 MED ORDER — REMIFENTANIL HCL 1 MG IV SOLR
INTRAVENOUS | Status: DC | PRN
  Administered 2017-09-30: .1 ug/kg/min via INTRAVENOUS

## 2017-09-30 MED ORDER — THROMBIN 5000 UNITS EX SOLR
CUTANEOUS | Status: AC
  Filled 2017-09-30: qty 5000

## 2017-09-30 MED ORDER — SUGAMMADEX SODIUM 200 MG/2ML IV SOLN
INTRAVENOUS | Status: AC
  Filled 2017-09-30: qty 2

## 2017-09-30 MED ORDER — FENTANYL CITRATE (PF) 250 MCG/5ML IJ SOLN
INTRAMUSCULAR | Status: AC
  Filled 2017-09-30: qty 5

## 2017-09-30 MED ORDER — DEXTROSE 5 % IV SOLN
INTRAVENOUS | Status: DC | PRN
  Administered 2017-09-30: 50 ug/min via INTRAVENOUS

## 2017-09-30 MED ORDER — HYDROXYZINE HCL 50 MG/ML IM SOLN
50.0000 mg | INTRAMUSCULAR | Status: DC | PRN
  Filled 2017-09-30: qty 1

## 2017-09-30 MED ORDER — CEFAZOLIN SODIUM-DEXTROSE 2-4 GM/100ML-% IV SOLN
2.0000 g | INTRAVENOUS | Status: AC
  Administered 2017-09-30: 2 g via INTRAVENOUS
  Filled 2017-09-30: qty 100

## 2017-09-30 MED ORDER — THROMBIN (RECOMBINANT) 20000 UNITS EX SOLR
CUTANEOUS | Status: DC | PRN
  Administered 2017-09-30: 17:00:00 via TOPICAL

## 2017-09-30 MED ORDER — ONDANSETRON HCL 4 MG/2ML IJ SOLN
INTRAMUSCULAR | Status: AC
  Filled 2017-09-30: qty 2

## 2017-09-30 MED ORDER — BUPIVACAINE HCL (PF) 0.5 % IJ SOLN
INTRAMUSCULAR | Status: AC
  Filled 2017-09-30: qty 30

## 2017-09-30 SURGICAL SUPPLY — 72 items
APPLICATOR COTTON TIP 6IN STRL (MISCELLANEOUS) ×2 IMPLANT
BAG DECANTER FOR FLEXI CONT (MISCELLANEOUS) ×2 IMPLANT
BANDAGE GAUZE 4  KLING STR (GAUZE/BANDAGES/DRESSINGS) IMPLANT
BIT DRILL WIRE PASS 1.3MM (BIT) IMPLANT
BLADE CLIPPER SURG (BLADE) ×2 IMPLANT
BNDG GAUZE ELAST 4 BULKY (GAUZE/BANDAGES/DRESSINGS) ×4 IMPLANT
BNDG STRETCH 4X75 NS LF (GAUZE/BANDAGES/DRESSINGS) ×2 IMPLANT
BUR ACORN 6.0 PRECISION (BURR) ×2 IMPLANT
BUR MATCHSTICK NEURO 3.0 LAGG (BURR) ×2 IMPLANT
BUR SPIRAL ROUTER 2.3 (BUR) ×2 IMPLANT
CANISTER SUCT 3000ML PPV (MISCELLANEOUS) ×2 IMPLANT
CARTRIDGE OIL MAESTRO DRILL (MISCELLANEOUS) ×2 IMPLANT
CLIP RANEY DISP (INSTRUMENTS) ×8 IMPLANT
CONT SPEC 4OZ CLIKSEAL STRL BL (MISCELLANEOUS) ×2 IMPLANT
COTTONBALL LRG STERILE PKG (GAUZE/BANDAGES/DRESSINGS) IMPLANT
DIFFUSER DRILL AIR PNEUMATIC (MISCELLANEOUS) ×4 IMPLANT
DRAIN SUBARACHNOID (WOUND CARE) IMPLANT
DRAPE MICROSCOPE LEICA (MISCELLANEOUS) IMPLANT
DRAPE NEUROLOGICAL W/INCISE (DRAPES) ×2 IMPLANT
DRAPE STERI IOBAN 125X83 (DRAPES) IMPLANT
DRAPE SURG 17X23 STRL (DRAPES) IMPLANT
DRAPE WARM FLUID 44X44 (DRAPE) ×2 IMPLANT
DRILL WIRE PASS 1.3MM (BIT)
DRSG ADAPTIC 3X8 NADH LF (GAUZE/BANDAGES/DRESSINGS) ×2 IMPLANT
ELECT REM PT RETURN 9FT ADLT (ELECTROSURGICAL) ×2
ELECTRODE REM PT RTRN 9FT ADLT (ELECTROSURGICAL) ×1 IMPLANT
EVACUATOR 1/8 PVC DRAIN (DRAIN) IMPLANT
EVACUATOR SILICONE 100CC (DRAIN) IMPLANT
GAUZE SPONGE 4X4 12PLY STRL (GAUZE/BANDAGES/DRESSINGS) ×2 IMPLANT
GLOVE BIOGEL PI IND STRL 8 (GLOVE) ×1 IMPLANT
GLOVE BIOGEL PI INDICATOR 8 (GLOVE) ×1
GLOVE ECLIPSE 7.5 STRL STRAW (GLOVE) ×2 IMPLANT
GLOVE EXAM NITRILE LRG STRL (GLOVE) IMPLANT
GLOVE EXAM NITRILE XL STR (GLOVE) IMPLANT
GLOVE EXAM NITRILE XS STR PU (GLOVE) IMPLANT
GOWN STRL REUS W/ TWL LRG LVL3 (GOWN DISPOSABLE) IMPLANT
GOWN STRL REUS W/ TWL XL LVL3 (GOWN DISPOSABLE) IMPLANT
GOWN STRL REUS W/TWL 2XL LVL3 (GOWN DISPOSABLE) IMPLANT
GOWN STRL REUS W/TWL LRG LVL3 (GOWN DISPOSABLE)
GOWN STRL REUS W/TWL XL LVL3 (GOWN DISPOSABLE)
GRAFT DURAGEN MATRIX 2WX2L ×2 IMPLANT
HEMOSTAT SURGICEL 2X14 (HEMOSTASIS) ×4 IMPLANT
KIT BASIN OR (CUSTOM PROCEDURE TRAY) ×2 IMPLANT
KIT ROOM TURNOVER OR (KITS) ×2 IMPLANT
MARKER SPHERE PSV REFLC 13MM (MARKER) ×4 IMPLANT
NEEDLE SPNL 22GX3.5 QUINCKE BK (NEEDLE) ×2 IMPLANT
NS IRRIG 1000ML POUR BTL (IV SOLUTION) ×4 IMPLANT
OIL CARTRIDGE MAESTRO DRILL (MISCELLANEOUS) ×4
PACK CRANIOTOMY CUSTOM (CUSTOM PROCEDURE TRAY) ×2 IMPLANT
PAD ARMBOARD 7.5X6 YLW CONV (MISCELLANEOUS) ×2 IMPLANT
PATTIES SURGICAL .25X.25 (GAUZE/BANDAGES/DRESSINGS) IMPLANT
PATTIES SURGICAL .5 X1 (DISPOSABLE) ×2 IMPLANT
PATTIES SURGICAL 1/4 X 3 (GAUZE/BANDAGES/DRESSINGS) ×2 IMPLANT
PATTIES SURGICAL 3 X3 (GAUZE/BANDAGES/DRESSINGS)
PATTIES SURGICAL 3X3 (GAUZE/BANDAGES/DRESSINGS) IMPLANT
PLATE 1.5  2HOLE MED NEURO (Plate) ×3 IMPLANT
PLATE 1.5 2HOLE MED NEURO (Plate) ×3 IMPLANT
RUBBERBAND STERILE (MISCELLANEOUS) IMPLANT
SCREW SELF DRILL HT 1.5/4MM (Screw) ×12 IMPLANT
SPONGE SURGIFOAM ABS GEL 100 (HEMOSTASIS) ×2 IMPLANT
STAPLER SKIN PROX WIDE 3.9 (STAPLE) ×2 IMPLANT
SUT NURALON 4 0 TR CR/8 (SUTURE) ×6 IMPLANT
SUT VIC AB 0 CT1 18XCR BRD8 (SUTURE) ×2 IMPLANT
SUT VIC AB 0 CT1 8-18 (SUTURE) ×2
SUT VIC AB 2-0 CP2 18 (SUTURE) ×4 IMPLANT
SYR CONTROL 10ML LL (SYRINGE) ×2 IMPLANT
TAPE CLOTH 2X10 TAN LF (GAUZE/BANDAGES/DRESSINGS) ×2 IMPLANT
TOWEL GREEN STERILE (TOWEL DISPOSABLE) ×2 IMPLANT
TOWEL GREEN STERILE FF (TOWEL DISPOSABLE) ×2 IMPLANT
TRAY FOLEY W/METER SILVER 16FR (SET/KITS/TRAYS/PACK) ×2 IMPLANT
UNDERPAD 30X30 (UNDERPADS AND DIAPERS) ×2 IMPLANT
WATER STERILE IRR 1000ML POUR (IV SOLUTION) ×2 IMPLANT

## 2017-09-30 NOTE — Progress Notes (Signed)
Vitals:   09/29/17 2011 09/30/17 0419 09/30/17 0828 09/30/17 1228  BP: 136/78 125/73 (!) 153/83 138/76  Pulse: (!) 58 (!) 54 (!) 58 62  Resp: 18 18 18 18   Temp: 98.3 F (36.8 C) 98.6 F (37 C) 97.7 F (36.5 C) 97.8 F (36.6 C)  TempSrc: Oral Oral Oral Oral  SpO2: 98% 97% 100% 100%  Weight:      Height:        CBC Recent Labs    09/29/17 0629 09/30/17 0919  WBC 11.1* 10.8*  HGB 12.7* 13.6  HCT 36.8* 39.3  PLT 192 205   BMET Recent Labs    09/29/17 0629 09/30/17 0919  NA 139 137  K 4.6 4.9  CL 105 102  CO2 27 27  GLUCOSE 137* 147*  BUN 18 17  CREATININE 0.87 0.87  CALCIUM 8.7* 8.6*    Patient seen in preop area prior to surgery.  No new complaints.  Awake, alert, oriented.  Following commands.  Right hemiparesis with moderate right upper extremity drift.  Plan: Spoke with the patient, his daughter [name], and his ex-wife.  Their questions were answered.  Plan is to proceed with surgery.  Hosie Spangle, MD 09/30/2017, 3:45 PM

## 2017-09-30 NOTE — Op Note (Signed)
09/30/2017  6:15 PM  PATIENT:  Christopher Morris  77 y.o. male  PRE-OPERATIVE DIAGNOSIS:  Left frontal brain tumor  POST-OPERATIVE DIAGNOSIS:  Left frontal brain tumor  PROCEDURE:  Procedure(s):  Left frontal craniotomy and resection of brain tumor with intraoperative stereotaxis (BrainLab)  SURGEON:  Surgeon(s): [name], MD  ANESTHESIA:   general  EBL:  Total I/O In: -  Out: 350 [Urine:300; Blood:50]  BLOOD ADMINISTERED:none  COUNT:  Correct per nursing staff  SPECIMEN:  Source of Specimen:  Brain tumor  DICTATION:  Prior to surgery CT and MRI data were loaded into the BrainLab system and preoperative stereotactic planning was performed. Patient brought the operating room plus general endotracheal anesthesia. This revealed makes it head holder was applied, and the patient was turned gently to the right with rolls beneath the left shoulder. The patient was registered to the [place] system and accurate localization was confirmed. We identified the location of the left frontal brain tumor, and planned a bicoronal incision.  The hair of the scalp was clipped, and the scalp was prepped with Betadine soap and solution and draped in sterile fashion. The line of the incision was infiltrated with local anesthetic with epinephrine. Incision was made and carried down through the galea, Raney clips were applied to the scalp edges to maintain hemostasis. The frontal scalp was reflected, and intraoperative stereotaxis localize the tumor, and the craniotomy was planned. 4 bur holes were made with a high-speed drill. The dura was found to be adherent to the overlying skull, but was freed up as feasible. We then cut between the 4 bur holes, and elevated the bone flap. Bipolar cautery was used to maintain hemostasis. The dura was partially opened, and we extended the dural opening in a U-shaped fashion hinged towards the midline. Veins bridging from the pial surface of the dura were coagulated and  divided.  Again using intraoperative stereotaxis, we localized the tumor and planned a transcortical approach. The pial surface was coagulated and divided, and we dissected down to the most superficial aspect of the tumor, about a centimeter below the cortical surface. Specimens of tumor were obtained for permanent examination. Much of the tumor was removed as specimen. The remaining tumor was removed with suction and bipolar cautery. In the end a gross total resection of tumor was achieved, with good hemostasis. The resection cavity was filled with saline, and hemostasis confirmed. We then lined the walls of the resection cavity with Surgicel. The dura was approximated with interrupted 4-0 Nurolon sutures and DuraGen was used to patch the areas of dural defect. The bone flap was secured to the skull with Lorenz cranial plates and screws. We used 3 medium straight plates and 4 mm screws. We then proceeded with scalp closure. The galea was closed with interrupted inverted undyed 2-0 Vicryl sutures. The skin edges were approximated with  surgical staples.  The patient was taken out of the 3 pin [place] headholder. The wound was dressed with Adaptic, sterile gauze, 2 Kerlix, and one Kling. Following surgery the patient was to be reversed and the anesthetic, extubated, and transferred to the recovery room for further care.   PLAN OF CARE: Initial postoperative care is in the PACU, the followed by further care in the neurosurgical ICU  PATIENT DISPOSITION:  PACU - hemodynamically stable.   Delay start of Pharmacological VTE agent (>24hrs) due to surgical blood loss or risk of bleeding:  yes

## 2017-09-30 NOTE — Anesthesia Procedure Notes (Signed)
Arterial Line Insertion Performed by: Valda Favia, CRNA, CRNA  Patient location: Pre-op. Preanesthetic checklist: patient identified, IV checked, site marked, risks and benefits discussed, surgical consent, monitors and equipment checked, pre-op evaluation, timeout performed and anesthesia consent Right, radial was placed Catheter size: 20 G Hand hygiene performed , maximum sterile barriers used  and Seldinger technique used Allen's test indicative of satisfactory collateral circulation Procedure performed without using ultrasound guided technique. Following insertion, dressing applied and Biopatch. Post procedure assessment: normal  Patient tolerated the procedure well with no immediate complications.

## 2017-09-30 NOTE — Care Management Important Message (Signed)
Important Message  Patient Details  Name: Christopher Morris MRN: [id number] Date of Birth: [birth year]   Medicare Important Message Given:  Yes    Aniyha Tate [address] 09/30/2017, 12:29 PM

## 2017-09-30 NOTE — Progress Notes (Signed)
Patient ID: Christopher Morris, male   DOB: 1941/02/23, 77 y.o.   MRN: [id number]  PROGRESS NOTE    Christopher Morris  BSJ:[id number] DOB: [birth year] DOA: 09/25/2017 PCP: No primary care provider on file.   Brief Narrative:  77 year old male with no past medical history presented with unsteady gait and difficulty talking for past several weeks and witnessed seizure on the day of presentation on 09/25/2017.  MRI revealed left frontal mass with surrounding edema.  Neurosurgery was consulted.  Started on intravenous Decadron and Keppra.   Assessment & Plan:   Principal Problem:   Brain tumor ([place]) Active Problems:   Seizure ([place])   Hyperglycemia   Leukocytosis   Hypokalemia  Newly diagnosed left frontal mass with surrounding edema -Neurosurgery following.  For OR today -Continue Decadron -CT chest, abdomen and pelvis did not show any abnormality  Newly diagnosed seizures probably secondary to above -Continue Keppra.  Currently seizure-free  Leukocytosis -Probably reactive from steroids.   -Repeat a.m. labs  Hypokalemia -Resolved    DVT prophylaxis: SCDs Code Status: Full Family Communication: Spoke with daughter at bedside Disposition Plan: Depends on clinical outcome  Consultants: Neurosurgery  Procedures: None  Antimicrobials: None   Subjective: Patient seen and examined at bedside.  No overnight worsening headache, seizures, nausea or vomiting.    Objective: Vitals:   09/29/17 1120 09/29/17 2011 09/30/17 0419 09/30/17 0828  BP: 125/73 136/78 125/73 (!) 153/83  Pulse: (!) 55 (!) 58 (!) 54 (!) 58  Resp: 18 18 18 18   Temp: 97.7 F (36.5 C) 98.3 F (36.8 C) 98.6 F (37 C) 97.7 F (36.5 C)  TempSrc: Oral Oral Oral Oral  SpO2: 99% 98% 97% 100%  Weight:      Height:        Intake/Output Summary (Last 24 hours) at 09/30/2017 0906 Last data filed at 09/30/2017 0419 Gross per 24 hour  Intake 1725 ml  Output 400 ml  Net 1325 ml   Filed Weights   09/25/17 0840  09/26/17 0011  Weight: 99.8 kg (220 lb) 93.3 kg (205 lb 11 oz)    Examination:  General exam: Appears calm and comfortable.  No acute distress Respiratory system: Bilateral decreased breath sounds at bases; no wheezing cardiovascular system: Rate controlled, S1-S2 positive    Data Reviewed: I have personally reviewed following labs and imaging studies  CBC: Recent Labs  Lab 09/25/17 0834 09/26/17 0426 09/28/17 0352 09/29/17 0629  WBC 11.8* 9.4 11.9* 11.1*  NEUTROABS 7.1  --   --  9.9*  HGB 13.1 12.4* 12.0* 12.7*  HCT 37.8* 37.2* 35.5* 36.8*  MCV 98.4 98.7 97.3 97.1  PLT 214 190 193 765   Basic Metabolic Panel: Recent Labs  Lab 09/25/17 0834 09/26/17 0426 09/28/17 0352 09/29/17 0629  NA 139 139 139 139  K 3.4* 4.0 3.9 4.6  CL 104 108 105 105  CO2 22 22 25 27   GLUCOSE 192* 142* 141* 137*  BUN 11 11 19 18   CREATININE 0.95 0.81 0.84 0.87  CALCIUM 8.9 8.9 8.8* 8.7*  MG 2.0  --   --  2.2   GFR: Estimated Creatinine Clearance: 85.9 mL/min (by C-G formula based on SCr of 0.87 mg/dL). Liver Function Tests: Recent Labs  Lab 09/25/17 0834  AST 24  ALT 14*  ALKPHOS 67  BILITOT 1.2  PROT 6.5  ALBUMIN 3.9   No results for input(s): LIPASE, AMYLASE in the last 168 hours. No results for input(s): AMMONIA in the last 168 hours. Coagulation Profile:  No results for input(s): INR, PROTIME in the last 168 hours. Cardiac Enzymes: No results for input(s): CKTOTAL, CKMB, CKMBINDEX, TROPONINI in the last 168 hours. BNP (last 3 results) No results for input(s): PROBNP in the last 8760 hours. HbA1C: No results for input(s): HGBA1C in the last 72 hours. CBG: No results for input(s): GLUCAP in the last 168 hours. Lipid Profile: No results for input(s): CHOL, HDL, LDLCALC, TRIG, CHOLHDL, LDLDIRECT in the last 72 hours. Thyroid Function Tests: No results for input(s): TSH, T4TOTAL, FREET4, T3FREE, THYROIDAB in the last 72 hours. Anemia Panel: No results for input(s):  VITAMINB12, FOLATE, FERRITIN, TIBC, IRON, RETICCTPCT in the last 72 hours. Sepsis Labs: No results for input(s): PROCALCITON, LATICACIDVEN in the last 168 hours.  Recent Results (from the past 240 hour(s))  MRSA PCR Screening     Status: None   Collection Time: 09/27/17  4:47 PM  Result Value Ref Range Status   MRSA by PCR NEGATIVE NEGATIVE Final    Comment:        The GeneXpert MRSA Assay (FDA approved for NASAL specimens only), is one component of a comprehensive MRSA colonization surveillance program. It is not intended to diagnose MRSA infection nor to guide or monitor treatment for MRSA infections. Performed at [facility], [place] [address], [place], [place] [address]          Radiology Studies: No results found.      Scheduled Meds: . Chlorhexidine Gluconate Cloth  6 each Topical Once   And  . Chlorhexidine Gluconate Cloth  6 each Topical Once  . dexamethasone  4 mg Intravenous Q6H  . levETIRAcetam  500 mg Oral Q12H   Continuous Infusions: . sodium chloride 75 mL/hr at 09/25/17 2308  . 0.9 % NaCl with KCl 20 mEq / L 75 mL/hr at 09/28/17 1022  .  ceFAZolin (ANCEF) IV       LOS: 5 days        [name], MD [facility] Pager [phone]  If 7PM-7AM, please contact night-coverage [email or web address] Password [email or web address] 09/30/2017, 9:06 AM

## 2017-09-30 NOTE — Anesthesia Preprocedure Evaluation (Addendum)
Anesthesia Evaluation  Patient identified by MRN, date of birth, ID band Patient awake    Reviewed: Allergy & Precautions, NPO status , Patient's Chart, lab work & pertinent test results  Airway Mallampati: I  TM Distance: >3 FB Neck ROM: Full    Dental  (+) Teeth Intact, Dental Advisory Given,    Pulmonary neg pulmonary ROS,    breath sounds clear to auscultation       Cardiovascular negative cardio ROS   Rhythm:Regular Rate:Normal     Neuro/Psych Seizures -,  negative psych ROS   GI/Hepatic negative GI ROS, Neg liver ROS,   Endo/Other  negative endocrine ROS  Renal/GU negative Renal ROS     Musculoskeletal negative musculoskeletal ROS (+)   Abdominal Normal abdominal exam  (+)   Peds  Hematology negative hematology ROS (+)   Anesthesia Other Findings   Reproductive/Obstetrics                            Lab Results  Component Value Date   WBC 10.8 (H) 09/30/2017   HGB 13.6 09/30/2017   HCT 39.3 09/30/2017   MCV 97.0 09/30/2017   PLT 205 09/30/2017   Lab Results  Component Value Date   CREATININE 0.87 09/30/2017   BUN 17 09/30/2017   NA 137 09/30/2017   K 4.9 09/30/2017   CL 102 09/30/2017   CO2 27 09/30/2017   No results found for: INR, PROTIME  EKG: PAC's noted, SR.   Anesthesia Physical Anesthesia Plan  ASA: II  Anesthesia Plan: General   Post-op Pain Management:    Induction: Intravenous  PONV Risk Score and Plan: 3 and Ondansetron, Dexamethasone and Treatment may vary due to age or medical condition  Airway Management Planned: Oral ETT  Additional Equipment: Arterial line  Intra-op Plan:   Post-operative Plan: Extubation in OR  Informed Consent: I have reviewed the patients History and Physical, chart, labs and discussed the procedure including the risks, benefits and alternatives for the proposed anesthesia with the patient or authorized representative  who has indicated his/her understanding and acceptance.   Dental advisory given  Plan Discussed with:   Anesthesia Plan Comments:        Anesthesia Quick Evaluation

## 2017-09-30 NOTE — Transfer of Care (Signed)
Immediate Anesthesia Transfer of Care Note  Patient: Christopher Morris  Procedure(s) Performed: CRANIOTOMY FOR RESECTION OF BRAIN TUMOR WITH STEREOTACTIC (N/A ) APPLICATION OF CRANIAL NAVIGATION (N/A )  Patient Location: PACU  Anesthesia Type:General  Level of Consciousness: drowsy  Airway & Oxygen Therapy: Patient Spontanous Breathing and Patient connected to face mask oxygen  Post-op Assessment: Report given to RN and Post -op Vital signs reviewed and stable  Post vital signs: Reviewed and stable  Last Vitals:  Vitals:   09/30/17 0828 09/30/17 1228  BP: (!) 153/83 138/76  Pulse: (!) 58 62  Resp: 18 18  Temp: 36.5 C 36.6 C  SpO2: 100% 100%    Last Pain:  Vitals:   09/30/17 1228  TempSrc: Oral  PainSc:          Complications: No apparent anesthesia complications

## 2017-09-30 NOTE — Anesthesia Procedure Notes (Signed)
Procedure Name: Intubation Date/Time: 09/30/2017 3:54 PM Performed by: Inda Coke, CRNA Pre-anesthesia Checklist: Patient identified, Emergency Drugs available, Suction available and Patient being monitored Patient Re-evaluated:Patient Re-evaluated prior to induction Oxygen Delivery Method: Circle System Utilized Preoxygenation: Pre-oxygenation with 100% oxygen Induction Type: IV induction Ventilation: Mask ventilation without difficulty Laryngoscope Size: Mac and 4 Grade View: Grade I Tube type: Oral Number of attempts: 1 Airway Equipment and Method: Stylet and Oral airway Placement Confirmation: ETT inserted through vocal cords under direct vision,  positive ETCO2 and breath sounds checked- equal and bilateral Secured at: 23 cm Tube secured with: Tape Dental Injury: Teeth and Oropharynx as per pre-operative assessment

## 2017-09-30 NOTE — Progress Notes (Signed)
Vitals:   09/30/17 0419 09/30/17 0828 09/30/17 1228 09/30/17 1832  BP: 125/73 (!) 153/83 138/76 128/72  Pulse: (!) 54 (!) 58 62 63  Resp: 18 18 18 11   Temp: 98.6 F (37 C) 97.7 F (36.5 C) 97.8 F (36.6 C) (!) 97.2 F (36.2 C)  TempSrc: Oral Oral Oral   SpO2: 97% 100% 100% 99%  Weight:      Height:        CBC Recent Labs    09/29/17 0629 09/30/17 0919  WBC 11.1* 10.8*  HGB 12.7* 13.6  HCT 36.8* 39.3  PLT 192 205   BMET Recent Labs    09/29/17 0629 09/30/17 0919  NA 139 137  K 4.6 4.9  CL 105 102  CO2 27 27  GLUCOSE 137* 147*  BUN 18 17  CREATININE 0.87 0.87  CALCIUM 8.7* 8.6*    Patient resting comfortably in PACU.  Seen at about 1900 and again now.  Initial exam showed significant limitations in motor function on right side as well as diminished speech. However now moving right side better and oriented to name.  Dressing is clean and dry. He is awake and alert and promptly following commands with all 4 extremities.  Plan: To be transferred to neurosurgery ICU and a little while. Postoperative MRI of the brain without and with G[place]am ordered for the morning.  Hosie Spangle, MD 09/30/2017, 7:38 PM

## 2017-10-01 ENCOUNTER — Encounter (HOSPITAL_COMMUNITY): Payer: Self-pay | Admitting: Neurosurgery

## 2017-10-01 LAB — GLUCOSE, CAPILLARY
GLUCOSE-CAPILLARY: 120 mg/dL — AB (ref 65–99)
GLUCOSE-CAPILLARY: 154 mg/dL — AB (ref 65–99)
Glucose-Capillary: 122 mg/dL — ABNORMAL HIGH (ref 65–99)
Glucose-Capillary: 129 mg/dL — ABNORMAL HIGH (ref 65–99)
Glucose-Capillary: 152 mg/dL — ABNORMAL HIGH (ref 65–99)
Glucose-Capillary: 165 mg/dL — ABNORMAL HIGH (ref 65–99)
Glucose-Capillary: 173 mg/dL — ABNORMAL HIGH (ref 65–99)

## 2017-10-01 LAB — MRSA PCR SCREENING: MRSA by PCR: NEGATIVE

## 2017-10-01 MED ORDER — LEVETIRACETAM 500 MG PO TABS
500.0000 mg | ORAL_TABLET | Freq: Two times a day (BID) | ORAL | Status: DC
  Administered 2017-10-01 – 2017-10-04 (×6): 500 mg via ORAL
  Filled 2017-10-01 (×6): qty 1

## 2017-10-01 MED ORDER — LEVETIRACETAM 500 MG PO TABS: 500.0000 mg | ORAL_TABLET | Freq: Two times a day (BID) | ORAL | Status: DC

## 2017-10-01 MED FILL — Thrombin For Soln 20000 Unit: CUTANEOUS | Qty: 1 | Status: AC

## 2017-10-01 NOTE — Care Management Note (Signed)
Case Management Note Original note by: Pollie Friar, RN 09/26/2017, 11:35 AM    Patient Details  Name: Christopher Morris MRN: [id number] Date of Birth: 08-25-1940  Subjective/Objective:    Pt admitted with brain tumor. He is from home with his daughter and grandson. Pt IADL. Hx; none PCP: [facility]                Action/Plan: Awaiting plans for possible surgery. CM following for d/c needs, physician orders.  Expected Discharge Date:  09/30/17               Expected Discharge Plan:  Home/Self Care  In-House Referral:     Discharge planning Services  CM Consult  Post Acute Care Choice:    Choice offered to:     DME Arranged:    DME Agency:     HH Arranged:    HH Agency:     Status of Service:  In process, will continue to follow  If discussed at Long Length of Stay Meetings, dates discussed:    Additional Comments:  10/01/17 J.Amarii Amy, RN, BSN Pt s/p Left frontal craniotomy and resection of brain tumor on 09/30/17.  Will continue to follow for discharge needs as pt progresses.  Consider PT/OT evaluations as needed.   Ella Bodo, RN 10/01/2017, 5:05 PM

## 2017-10-01 NOTE — Anesthesia Postprocedure Evaluation (Signed)
Anesthesia Post Note  Patient: Lavonne Cass  Procedure(s) Performed: CRANIOTOMY FOR RESECTION OF BRAIN TUMOR WITH STEREOTACTIC (N/A ) APPLICATION OF CRANIAL NAVIGATION (N/A )     Patient location during evaluation: PACU Anesthesia Type: General Level of consciousness: awake and alert Pain management: pain level controlled Vital Signs Assessment: post-procedure vital signs reviewed and stable Respiratory status: spontaneous breathing, nonlabored ventilation, respiratory function stable and patient connected to nasal cannula oxygen Cardiovascular status: blood pressure returned to baseline and stable Postop Assessment: no apparent nausea or vomiting Anesthetic complications: no    Last Vitals:  Vitals:   10/01/17 0700 10/01/17 0727  BP: (!) 146/69 (!) 176/73  Pulse: 66   Resp: 12   Temp:    SpO2: 95%     Last Pain:  Vitals:   10/01/17 0400  TempSrc: Oral  PainSc: 0-No pain                 Effie Berkshire

## 2017-10-01 NOTE — Progress Notes (Signed)
Patient ambulated about 300 feet. Patient tolerated activity well.will continue to monitor.

## 2017-10-01 NOTE — Progress Notes (Signed)
Subjective: Resting in bed, comfortable. Has ambulated in the halls with the staff earlier this morning. Denies much appetite.  Objective: Vital signs in last 24 hours: Vitals:   10/01/17 0500 10/01/17 0600 10/01/17 0700 10/01/17 0727  BP: (!) 143/78 137/75 (!) 146/69 (!) 176/73  Pulse: 62 69 66   Resp: 17 14 12    Temp:      TempSrc:      SpO2: 97% 96% 95%   Weight:      Height:        Intake/Output from previous day: 03/18 0701 - 03/19 0700 In: 4150 [I.V.:4150] Out: 1825 [Urine:1775; Blood:50] Intake/Output this shift: No intake/output data recorded.  Physical Exam:  Awake and alert, following commands, moving all 4 extremities, but mild right hemiparesis. Dressing clean and dry.  CBC Recent Labs    09/29/17 0629 09/30/17 0919  WBC 11.1* 10.8*  HGB 12.7* 13.6  HCT 36.8* 39.3  PLT 192 205   BMET Recent Labs    09/29/17 0629 09/30/17 0919  NA 139 137  K 4.6 4.9  CL 105 102  CO2 27 27  GLUCOSE 137* 147*  BUN 18 17  CREATININE 0.87 0.87  CALCIUM 8.7* 8.6*    Assessment/Plan: Doing well following craniotomy for resection of brain tumor.  Postresection MRI the brain without and with gadolinium pending.Will advance to regular diet, DC a line, and encourage continued ambulation the halls. Pathology pending.  Case discussed with Dr. Remi Haggard, appreciate assistance of [facility]. I expect that patient will be moving out of ICU within the next 24 hours.   Hosie Spangle, MD 10/01/2017, 9:02 AM

## 2017-10-01 NOTE — Progress Notes (Addendum)
Patient ID: Christopher Morris, male   DOB: January 13, 1941, 77 y.o.   MRN: [id number]  PROGRESS NOTE    Christopher Morris  PPI:[id number] DOB: 1940-11-17 DOA: 09/25/2017 PCP: No primary care provider on file.   Brief Narrative:  77 year old male with no past medical history presented with unsteady gait and difficulty talking for past several weeks and witnessed seizure on the day of presentation on 09/25/2017.  MRI revealed left frontal mass with surrounding edema.  Neurosurgery was consulted.  Started on intravenous Decadron and Keppra.  Patient had surgery on 09/30/2017 by neurosurgery.   Assessment & Plan:   Principal Problem:   Brain tumor [facility]) Active Problems:   Seizure ([place])   Hyperglycemia   Leukocytosis   Hypokalemia  Newly diagnosed left frontal mass with surrounding edema -Status post surgery on 09/30/2017 by neurosurgery.  Wound care and dressing changes per neurosurgery. -Continue Decadron as per neurosurgery -CT chest, abdomen and pelvis did not show any abnormality -Repeat a.m. Labs -Spoke to Dr. Sherwood Gambler on phone and he recommended that patient remain on hospitalist service while the patient is in ICU for the next 1 or 2 days and neurosurgery will keep following the patient.  Newly diagnosed seizures probably secondary to above -Continue Keppra.  Currently seizure-free  Leukocytosis -Probably reactive from steroids.  Resolved -Repeat a.m. labs  Hypokalemia -Resolved    DVT prophylaxis: SCDs Code Status: Full Family Communication: None at bedside Disposition Plan: Depends on clinical outcome  Consultants: Neurosurgery  Procedures: None  Antimicrobials: None   Subjective: Patient seen and examined at bedside.  He denies excessive headache, nausea or vomiting.   objective: Vitals:   10/01/17 0500 10/01/17 0600 10/01/17 0700 10/01/17 0727  BP: (!) 143/78 137/75 (!) 146/69 (!) 176/73  Pulse: 62 69 66   Resp: 17 14 12    Temp:      TempSrc:      SpO2: 97% 96%  95%   Weight:      Height:        Intake/Output Summary (Last 24 hours) at 10/01/2017 0824 Last data filed at 10/01/2017 0600 Gross per 24 hour  Intake 4150 ml  Output 1825 ml  Net 2325 ml   Filed Weights   09/25/17 0840 09/26/17 0011 09/30/17 2020  Weight: 99.8 kg (220 lb) 93.3 kg (205 lb 11 oz) 86.6 kg (190 lb 14.7 oz)    Examination:  General exam: Appears calm and comfortable.  No acute distress.  Patient has a dressing on his scalp Respiratory system: Bilateral decreased breath sounds at bases; no wheezing cardiovascular system: Rate controlled, S1-S2 positive    Data Reviewed: I have personally reviewed following labs and imaging studies  CBC: Recent Labs  Lab 09/25/17 0834 09/26/17 0426 09/28/17 0352 09/29/17 0629 09/30/17 0919  WBC 11.8* 9.4 11.9* 11.1* 10.8*  NEUTROABS 7.1  --   --  9.9* 9.6*  HGB 13.1 12.4* 12.0* 12.7* 13.6  HCT 37.8* 37.2* 35.5* 36.8* 39.3  MCV 98.4 98.7 97.3 97.1 97.0  PLT 214 190 193 192 063   Basic Metabolic Panel: Recent Labs  Lab 09/25/17 0834 09/26/17 0426 09/28/17 0352 09/29/17 0629 09/30/17 0919  NA 139 139 139 139 137  K 3.4* 4.0 3.9 4.6 4.9  CL 104 108 105 105 102  CO2 22 22 25 27 27   GLUCOSE 192* 142* 141* 137* 147*  BUN 11 11 19 18 17   CREATININE 0.95 0.81 0.84 0.87 0.87  CALCIUM 8.9 8.9 8.8* 8.7* 8.6*  MG 2.0  --   --  2.2 2.1   GFR: Estimated Creatinine Clearance: 85.7 mL/min (by C-G formula based on SCr of 0.87 mg/dL). Liver Function Tests: Recent Labs  Lab 09/25/17 0834  AST 24  ALT 14*  ALKPHOS 67  BILITOT 1.2  PROT 6.5  ALBUMIN 3.9   No results for input(s): LIPASE, AMYLASE in the last 168 hours. No results for input(s): AMMONIA in the last 168 hours. Coagulation Profile: No results for input(s): INR, PROTIME in the last 168 hours. Cardiac Enzymes: No results for input(s): CKTOTAL, CKMB, CKMBINDEX, TROPONINI in the last 168 hours. BNP (last 3 results) No results for input(s): PROBNP in the last  8760 hours. HbA1C: No results for input(s): HGBA1C in the last 72 hours. CBG: Recent Labs  Lab 09/30/17 2036 10/01/17 0010  GLUCAP 143* 129*   Lipid Profile: No results for input(s): CHOL, HDL, LDLCALC, TRIG, CHOLHDL, LDLDIRECT in the last 72 hours. Thyroid Function Tests: No results for input(s): TSH, T4TOTAL, FREET4, T3FREE, THYROIDAB in the last 72 hours. Anemia Panel: No results for input(s): VITAMINB12, FOLATE, FERRITIN, TIBC, IRON, RETICCTPCT in the last 72 hours. Sepsis Labs: No results for input(s): PROCALCITON, LATICACIDVEN in the last 168 hours.  Recent Results (from the past 240 hour(s))  MRSA PCR Screening     Status: None   Collection Time: 09/27/17  4:47 PM  Result Value Ref Range Status   MRSA by PCR NEGATIVE NEGATIVE Final    Comment:        The GeneXpert MRSA Assay (FDA approved for NASAL specimens only), is one component of a comprehensive MRSA colonization surveillance program. It is not intended to diagnose MRSA infection nor to guide or monitor treatment for MRSA infections. Performed at [facility], [place] [address], [place], [place] [address]   MRSA PCR Screening     Status: None   Collection Time: 09/30/17  8:20 PM  Result Value Ref Range Status   MRSA by PCR NEGATIVE NEGATIVE Final    Comment:        The GeneXpert MRSA Assay (FDA approved for NASAL specimens only), is one component of a comprehensive MRSA colonization surveillance program. It is not intended to diagnose MRSA infection nor to guide or monitor treatment for MRSA infections. Performed at [facility], [place] [address], [place], [place] [address]          Radiology Studies: No results found.      Scheduled Meds: . dexamethasone  4 mg Intravenous Q6H   Continuous Infusions: . sodium chloride Stopped (09/30/17 1840)  . sodium chloride Stopped (09/30/17 1839)  . 0.9 % NaCl with KCl 20 mEq / L 75 mL/hr at 10/01/17 0600  . levETIRAcetam Stopped (10/01/17  0530)     LOS: 6 days        [name], MD [facility] Pager [phone]  If 7PM-7AM, please contact night-coverage [email or web address] Password [facility] 10/01/2017, 8:24 AM

## 2017-10-01 NOTE — Progress Notes (Signed)
Visited with patient per request of friend and Doristine Bosworth.  Patient sitting up and is alert and communicating well. Prayed for patient.  Patient looking forward to his d/c soon. Patient welcomed visit.  Chaplain available as needed.  Jaclynn Major, [place], [facility], Pager [phone]

## 2017-10-02 ENCOUNTER — Inpatient Hospital Stay (HOSPITAL_COMMUNITY): Payer: Medicare Other

## 2017-10-02 LAB — GLUCOSE, CAPILLARY
GLUCOSE-CAPILLARY: 143 mg/dL — AB (ref 65–99)
GLUCOSE-CAPILLARY: 153 mg/dL — AB (ref 65–99)
GLUCOSE-CAPILLARY: 223 mg/dL — AB (ref 65–99)
Glucose-Capillary: 125 mg/dL — ABNORMAL HIGH (ref 65–99)
Glucose-Capillary: 129 mg/dL — ABNORMAL HIGH (ref 65–99)
Glucose-Capillary: 166 mg/dL — ABNORMAL HIGH (ref 65–99)

## 2017-10-02 LAB — CBC WITH DIFFERENTIAL/PLATELET
BASOS PCT: 0 %
Basophils Absolute: 0 10*3/uL (ref 0.0–0.1)
Eosinophils Absolute: 0 10*3/uL (ref 0.0–0.7)
Eosinophils Relative: 0 %
HEMATOCRIT: 38.1 % — AB (ref 39.0–52.0)
HEMOGLOBIN: 13.2 g/dL (ref 13.0–17.0)
Lymphocytes Relative: 9 %
Lymphs Abs: 1.1 10*3/uL (ref 0.7–4.0)
MCH: 33.5 pg (ref 26.0–34.0)
MCHC: 34.6 g/dL (ref 30.0–36.0)
MCV: 96.7 fL (ref 78.0–100.0)
MONOS PCT: 5 %
Monocytes Absolute: 0.7 10*3/uL (ref 0.1–1.0)
NEUTROS ABS: 11 10*3/uL — AB (ref 1.7–7.7)
NEUTROS PCT: 86 %
Platelets: 170 10*3/uL (ref 150–400)
RBC: 3.94 MIL/uL — ABNORMAL LOW (ref 4.22–5.81)
RDW: 12.5 % (ref 11.5–15.5)
WBC: 12.8 10*3/uL — ABNORMAL HIGH (ref 4.0–10.5)

## 2017-10-02 LAB — BASIC METABOLIC PANEL
Anion gap: 8 (ref 5–15)
BUN: 20 mg/dL (ref 6–20)
CHLORIDE: 101 mmol/L (ref 101–111)
CO2: 26 mmol/L (ref 22–32)
CREATININE: 0.73 mg/dL (ref 0.61–1.24)
Calcium: 8.3 mg/dL — ABNORMAL LOW (ref 8.9–10.3)
GFR calc non Af Amer: >60 mL/min (ref >60)
Glucose, Bld: 148 mg/dL — ABNORMAL HIGH (ref 65–99)
Potassium: 4.5 mmol/L (ref 3.5–5.1)
Sodium: 135 mmol/L (ref 135–145)

## 2017-10-02 LAB — MAGNESIUM: Magnesium: 2.3 mg/dL (ref 1.7–2.4)

## 2017-10-02 MED ORDER — GADOBENATE DIMEGLUMINE 529 MG/ML IV SOLN
20.0000 mL | Freq: Once | INTRAVENOUS | Status: AC
  Administered 2017-10-02: 18 mL via INTRAVENOUS

## 2017-10-02 MED ORDER — DEXAMETHASONE 4 MG PO TABS
4.0000 mg | ORAL_TABLET | Freq: Four times a day (QID) | ORAL | Status: DC
  Administered 2017-10-02 – 2017-10-04 (×7): 4 mg via ORAL
  Filled 2017-10-02 (×6): qty 1

## 2017-10-02 NOTE — Progress Notes (Signed)
Subjective: Patient resting comfortably in bed, has ambulated in the hall with the staff.  Postoperative MRI done today and shows at least 95% resection of tumor, with most of the residual tumor located posteriorly, most adjacent to the primary motor cortex, overall appearance excellent.  Denies complaints.  Surgical path report returned today, Dr. Lyndon Code describes glioblastoma multiforme.  Objective: Vital signs in last 24 hours: Vitals:   10/02/17 1200 10/02/17 1210 10/02/17 1215 10/02/17 1300  BP:  (!) 146/76 (!) 147/87 129/75  Pulse:  67 69 (!) 59  Resp:  16 14 12   Temp: 99.3 F (37.4 C)     TempSrc: Oral     SpO2:  97% 94% 97%  Weight:      Height:        Intake/Output from previous day: 03/19 0701 - 03/20 0700 In: 645 [P.O.:120; I.V.:525] Out: 1975 [Urine:1975] Intake/Output this shift: Total I/O In: 440 [P.O.:440] Out: 375 [Urine:375]  Physical Exam:  Awake and alert, following commands. Moderate right hemiparesis, arm weaker than leg, some facial weakness as well.  Dressing removed, incision healing nicely.  CBC Recent Labs    09/30/17 0919 10/02/17 0400  WBC 10.8* 12.8*  HGB 13.6 13.2  HCT 39.3 38.1*  PLT 205 170   BMET Recent Labs    09/30/17 0919 10/02/17 0400  NA 137 135  K 4.9 4.5  CL 102 101  CO2 27 26  GLUCOSE 147* 148*  BUN 17 20  CREATININE 0.87 0.73  CALCIUM 8.6* 8.3*    Studies/Results: [name] PJ Contrast  Result Date: 10/02/2017 CLINICAL DATA:  Tumor resection 09/30/2016. EXAM: MRI HEAD WITHOUT AND WITH CONTRAST TECHNIQUE: Multiplanar, multiecho pulse sequences of the brain and surrounding structures were obtained without and with intravenous contrast. CONTRAST:  45mL MULTIHANCE GADOBENATE DIMEGLUMINE 529 MG/ML IV SOLN COMPARISON:  Preoperative MRI of the brain 09/25/2017. FINDINGS: Brain: Left frontal craniotomy is noted. The bulk of the tumor has been resected. There is some residual enhancement along the posterior margin of the  surgical cavity as well as the superomedial and inferolateral margins. The deep margin demonstrates no significant enhancement. Posteriorly the area of enhancement extends obliquely 3.2 cm. Blood products are evident within the surgical cavity. Extent of surrounding vasogenic edema is not significantly changed. Mass effect is significantly relieved. There is still some asymmetric impact on the frontal horn of the left lateral ventricle. Sulcal effacement over the convexity is relieved. Minimal midline shift remains. Mild atrophy and white matter disease is otherwise stable. No distal enhancement is present. Expected enhancement along the dura is noted following surgery. Vascular: Flow is present in the major intracranial arteries. Skull and upper cervical spine: The skull base is within normal limits. Degenerative changes are noted in the upper cervical spine, most evident at C3-4 were central canal stenosis is present. Degenerative endplate changes are present. Marrow signal is otherwise normal. Sinuses/Orbits: The paranasal sinuses and mastoid air cells are clear. Globes and orbits are within normal limits. IMPRESSION: 1. Near complete resection of tumor in the left frontal lobe. 2. Rim of residual enhancement posteriorly along the margin of the resection cavity with some enhancement superior medial and inferolateral. The majority of the surgical cavity does not enhance, suggesting some residual tumor posteriorly as described. 3. Surrounding vasogenic edema is stable with decreased mass effect. 4. Blood products are noted within the resection cavity. 5. Mild atrophy and white matter disease is otherwise stable. No acute infarct is present. Electronically Signed   By:  San Morelle M.D.   On: 10/02/2017 12:48    Assessment/Plan: Doing well following surgery. Transfer to 4 N. Progressive unit placed, transfer pending.  Spoke with the patient and his daughter regarding the pathology report and MRI  findings. Have placed consult request via [facility] (neuro-oncology navigator) with Dr.Zachary Vaslow from neuro-oncology and Dr. Eppie Gibson from radiation oncology for postoperative adjuvant therapy.  We'll change Decadron to by mouth. PT/ST/OT consultations placed by [facility] (appreciate their assistance).   Hosie Spangle, MD 10/02/2017, 2:26 PM

## 2017-10-02 NOTE — Consult Note (Signed)
The patient's case has been reviewed and a member of the [facility] will see him on Thursday 10/03/17.

## 2017-10-02 NOTE — Progress Notes (Signed)
Patient ID: Christopher Morris, male   DOB: August 16, 1940, 77 y.o.   MRN: [id number]  PROGRESS NOTE    Advay Volante  ATF:[id number] DOB: [birth year] DOA: 09/25/2017 PCP: No primary care provider on file.   Brief Narrative:  77 year old male with no past medical history presented with unsteady gait and difficulty talking for past several weeks and witnessed seizure on the day of presentation on 09/25/2017.  MRI revealed left frontal mass with surrounding edema.  Neurosurgery was consulted.  Started on intravenous Decadron and Keppra.  Patient had surgery on 09/30/2017 by neurosurgery.   Assessment & Plan:   Principal Problem:   Brain tumor [facility]) Active Problems:   Seizure ([place])   Hyperglycemia   Leukocytosis   Hypokalemia  Newly diagnosed left frontal mass with surrounding edema -Status post surgery on 09/30/2017 by neurosurgery.  Wound care and dressing changes per neurosurgery.  Follow further recommendations from neurosurgery -Continue Decadron as per neurosurgery -CT chest, abdomen and pelvis did not show any abnormality   Newly diagnosed seizures probably secondary to above -Continue Keppra.  Currently seizure-free.  Outpatient follow-up with neurology  Leukocytosis -Probably reactive from steroids and surgery.  Resolved -Repeat a.m. labs  Hypokalemia -Resolved    DVT prophylaxis: SCDs Code Status: Full Family Communication: None at bedside Disposition Plan: Depends on clinical outcome  Consultants: Neurosurgery  Procedures: None  Antimicrobials: None   Subjective: Patient seen and examined at bedside.  No overnight fever, nausea, vomiting or excessive headache. objective: Vitals:   10/02/17 0600 10/02/17 0700 10/02/17 0800 10/02/17 0900  BP: (!) 146/78 128/72 136/76 129/78  Pulse: 63 63 62 78  Resp:      Temp:   99 F (37.2 C)   TempSrc:   Oral   SpO2: 98% 95% 96% 98%  Weight:      Height:        Intake/Output Summary (Last 24 hours) at 10/02/2017 1007 Last  data filed at 10/02/2017 0600 Gross per 24 hour  Intake 420 ml  Output 1775 ml  Net -1355 ml   Filed Weights   09/25/17 0840 09/26/17 0011 09/30/17 2020  Weight: 99.8 kg (220 lb) 93.3 kg (205 lb 11 oz) 86.6 kg (190 lb 14.7 oz)    Examination:  General exam: No acute distress.  Patient has a dressing on his scalp Respiratory system: Bilateral decreased breath sounds at bases cardiovascular system: Rate controlled, S1-S2 positive    Data Reviewed: I have personally reviewed following labs and imaging studies  CBC: Recent Labs  Lab 09/26/17 0426 09/28/17 0352 09/29/17 0629 09/30/17 0919 10/02/17 0400  WBC 9.4 11.9* 11.1* 10.8* 12.8*  NEUTROABS  --   --  9.9* 9.6* 11.0*  HGB 12.4* 12.0* 12.7* 13.6 13.2  HCT 37.2* 35.5* 36.8* 39.3 38.1*  MCV 98.7 97.3 97.1 97.0 96.7  PLT 190 193 192 205 270   Basic Metabolic Panel: Recent Labs  Lab 09/26/17 0426 09/28/17 0352 09/29/17 0629 09/30/17 0919 10/02/17 0400  NA 139 139 139 137 135  K 4.0 3.9 4.6 4.9 4.5  CL 108 105 105 102 101  CO2 22 25 27 27 26   GLUCOSE 142* 141* 137* 147* 148*  BUN 11 19 18 17 20   CREATININE 0.81 0.84 0.87 0.87 0.73  CALCIUM 8.9 8.8* 8.7* 8.6* 8.3*  MG  --   --  2.2 2.1 2.3   GFR: Estimated Creatinine Clearance: 93.2 mL/min (by C-G formula based on SCr of 0.73 mg/dL). Liver Function Tests: No results for input(s): AST,  ALT, ALKPHOS, BILITOT, PROT, ALBUMIN in the last 168 hours. No results for input(s): LIPASE, AMYLASE in the last 168 hours. No results for input(s): AMMONIA in the last 168 hours. Coagulation Profile: No results for input(s): INR, PROTIME in the last 168 hours. Cardiac Enzymes: No results for input(s): CKTOTAL, CKMB, CKMBINDEX, TROPONINI in the last 168 hours. BNP (last 3 results) No results for input(s): PROBNP in the last 8760 hours. HbA1C: No results for input(s): HGBA1C in the last 72 hours. CBG: Recent Labs  Lab 10/01/17 1533 10/01/17 2024 10/01/17 2354 10/02/17 0343  10/02/17 0834  GLUCAP 152* 173* 165* 143* 129*   Lipid Profile: No results for input(s): CHOL, HDL, LDLCALC, TRIG, CHOLHDL, LDLDIRECT in the last 72 hours. Thyroid Function Tests: No results for input(s): TSH, T4TOTAL, FREET4, T3FREE, THYROIDAB in the last 72 hours. Anemia Panel: No results for input(s): VITAMINB12, FOLATE, FERRITIN, TIBC, IRON, RETICCTPCT in the last 72 hours. Sepsis Labs: No results for input(s): PROCALCITON, LATICACIDVEN in the last 168 hours.  Recent Results (from the past 240 hour(s))  MRSA PCR Screening     Status: None   Collection Time: 09/27/17  4:47 PM  Result Value Ref Range Status   MRSA by PCR NEGATIVE NEGATIVE Final    Comment:        The GeneXpert MRSA Assay (FDA approved for NASAL specimens only), is one component of a comprehensive MRSA colonization surveillance program. It is not intended to diagnose MRSA infection nor to guide or monitor treatment for MRSA infections. Performed at [facility], [place] [address], [place], [place] [address]   MRSA PCR Screening     Status: None   Collection Time: 09/30/17  8:20 PM  Result Value Ref Range Status   MRSA by PCR NEGATIVE NEGATIVE Final    Comment:        The GeneXpert MRSA Assay (FDA approved for NASAL specimens only), is one component of a comprehensive MRSA colonization surveillance program. It is not intended to diagnose MRSA infection nor to guide or monitor treatment for MRSA infections. Performed at [facility], [place] [address], [place], [place] [address]          Radiology Studies: No results found.      Scheduled Meds: . dexamethasone  4 mg Intravenous Q6H  . levETIRAcetam  500 mg Oral BID   Continuous Infusions: . sodium chloride Stopped (09/30/17 1840)  . sodium chloride Stopped (09/30/17 1839)  . 0.9 % NaCl with KCl 20 mEq / L Stopped (10/01/17 1400)     LOS: 7 days        [name], MD [facility] Pager [phone]  If  7PM-7AM, please contact night-coverage [email or web address] Password [email or web address] 10/02/2017, 10:07 AM

## 2017-10-03 ENCOUNTER — Ambulatory Visit
Admit: 2017-10-03 | Discharge: 2017-10-03 | Disposition: A | Discharge status: Home / Self Care | Payer: Medicare Other | Attending: Radiation Oncology | Admitting: Radiation Oncology

## 2017-10-03 ENCOUNTER — Encounter (HOSPITAL_COMMUNITY): Payer: Self-pay | Admitting: Radiation Oncology

## 2017-10-03 DIAGNOSIS — D496 Neoplasm of unspecified behavior of brain: Secondary | ICD-10-CM

## 2017-10-03 DIAGNOSIS — C719 Malignant neoplasm of brain, unspecified: Secondary | ICD-10-CM

## 2017-10-03 LAB — CBC WITH DIFFERENTIAL/PLATELET
Basophils Absolute: 0 10*3/uL (ref 0.0–0.1)
Basophils Relative: 0 %
EOS ABS: 0 10*3/uL (ref 0.0–0.7)
Eosinophils Relative: 0 %
HCT: 38.6 % — ABNORMAL LOW (ref 39.0–52.0)
HEMOGLOBIN: 13.4 g/dL (ref 13.0–17.0)
LYMPHS ABS: 1 10*3/uL (ref 0.7–4.0)
LYMPHS PCT: 9 %
MCH: 33.7 pg (ref 26.0–34.0)
MCHC: 34.7 g/dL (ref 30.0–36.0)
MCV: 97 fL (ref 78.0–100.0)
MONOS PCT: 5 %
Monocytes Absolute: 0.5 10*3/uL (ref 0.1–1.0)
NEUTROS ABS: 9.8 10*3/uL — AB (ref 1.7–7.7)
NEUTROS PCT: 86 %
Platelets: 166 10*3/uL (ref 150–400)
RBC: 3.98 MIL/uL — AB (ref 4.22–5.81)
RDW: 12.4 % (ref 11.5–15.5)
WBC: 11.4 10*3/uL — AB (ref 4.0–10.5)

## 2017-10-03 LAB — BASIC METABOLIC PANEL
Anion gap: 12 (ref 5–15)
BUN: 20 mg/dL (ref 6–20)
CHLORIDE: 100 mmol/L — AB (ref 101–111)
CO2: 24 mmol/L (ref 22–32)
Calcium: 8.4 mg/dL — ABNORMAL LOW (ref 8.9–10.3)
Creatinine, Ser: 0.69 mg/dL (ref 0.61–1.24)
GFR calc Af Amer: >60 mL/min (ref >60)
GFR calc non Af Amer: >60 mL/min (ref >60)
Glucose, Bld: 144 mg/dL — ABNORMAL HIGH (ref 65–99)
POTASSIUM: 4.5 mmol/L (ref 3.5–5.1)
SODIUM: 136 mmol/L (ref 135–145)

## 2017-10-03 LAB — GLUCOSE, CAPILLARY
GLUCOSE-CAPILLARY: 146 mg/dL — AB (ref 65–99)
GLUCOSE-CAPILLARY: 115 mg/dL — AB (ref 65–99)
GLUCOSE-CAPILLARY: 129 mg/dL — AB (ref 65–99)
GLUCOSE-CAPILLARY: 130 mg/dL — AB (ref 65–99)
Glucose-Capillary: 142 mg/dL — ABNORMAL HIGH (ref 65–99)

## 2017-10-03 LAB — MAGNESIUM: MAGNESIUM: 2.3 mg/dL (ref 1.7–2.4)

## 2017-10-03 MED ORDER — INSULIN ASPART 100 UNIT/ML ~~LOC~~ SOLN
0.0000 [IU] | Freq: Three times a day (TID) | SUBCUTANEOUS | Status: DC
  Administered 2017-10-03 – 2017-10-04 (×2): 1 [IU] via SUBCUTANEOUS

## 2017-10-03 NOTE — Progress Notes (Signed)
Patient ID: Christopher Morris, male   DOB: September 25, 1940, 77 y.o.   MRN: [id number]  PROGRESS NOTE    Christopher Morris  ATF:[id number] DOB: Dec 14, 1940 DOA: 09/25/2017 PCP: No primary care provider on file.   Brief Narrative:  77 year old male with no past medical history presented with unsteady gait and difficulty talking for past several weeks and witnessed seizure on the day of presentation on 09/25/2017.  MRI revealed left frontal mass with surrounding edema.  Neurosurgery was consulted.  Started on intravenous Decadron and Keppra.  Patient had surgery on 09/30/2017 by neurosurgery.   Assessment & Plan:   Principal Problem:   Brain tumor [facility]) Active Problems:   Seizure ([place])   Hyperglycemia   Leukocytosis   Hypokalemia  Newly diagnosed left frontal mass with surrounding edema -Status post surgery on 09/30/2017 by neurosurgery.  Wound care and dressing changes per neurosurgery.  Follow further recommendations from neurosurgery -Continue Decadron as per neurosurgery -CT chest, abdomen and pelvis did not show any abnormality  Newly diagnosed seizures probably secondary to above -continue current treatment plan - Outpatient follow-up with neurology  Leukocytosis -Suspect reactive from steroids and surgery.   Hypokalemia -Resolved on last check  DVT prophylaxis: SCDs Code Status: Full Family Communication: None at bedside Disposition Plan: Depends on clinical outcome  Consultants: Neurosurgery  Procedures: None  Antimicrobials: None   Subjective: Pt has no new complaints.   objective: Vitals:   10/03/17 0800 10/03/17 0854 10/03/17 0900 10/03/17 1140  BP: (!) 156/90  (!) 153/79   Pulse: (!) 56 69 65   Resp: (!) 9 14 16    Temp:  98 F (36.7 C)  98.5 F (36.9 C)  TempSrc:    Oral  SpO2: 97% 97% 94%   Weight:      Height:        Intake/Output Summary (Last 24 hours) at 10/03/2017 1508 Last data filed at 10/03/2017 1459 Gross per 24 hour  Intake 1060 ml  Output 2250  ml  Net -1190 ml   Filed Weights   09/25/17 0840 09/26/17 0011 09/30/17 2020  Weight: 99.8 kg (220 lb) 93.3 kg (205 lb 11 oz) 86.6 kg (190 lb 14.7 oz)    Examination:  General exam: No acute distress.  Alert and awake Respiratory system: equal chest rise, no wheezes cardiovascular system: Rate controlled, S1-S2 positive    Data Reviewed: I have personally reviewed following labs and imaging studies  CBC: Recent Labs  Lab 09/28/17 0352 09/29/17 0629 09/30/17 0919 10/02/17 0400 10/03/17 0510  WBC 11.9* 11.1* 10.8* 12.8* 11.4*  NEUTROABS  --  9.9* 9.6* 11.0* 9.8*  HGB 12.0* 12.7* 13.6 13.2 13.4  HCT 35.5* 36.8* 39.3 38.1* 38.6*  MCV 97.3 97.1 97.0 96.7 97.0  PLT 193 192 205 170 270   Basic Metabolic Panel: Recent Labs  Lab 09/28/17 0352 09/29/17 0629 09/30/17 0919 10/02/17 0400 10/03/17 0510  NA 139 139 137 135 136  K 3.9 4.6 4.9 4.5 4.5  CL 105 105 102 101 100*  CO2 25 27 27 26 24   GLUCOSE 141* 137* 147* 148* 144*  BUN 19 18 17 20 20   CREATININE 0.84 0.87 0.87 0.73 0.69  CALCIUM 8.8* 8.7* 8.6* 8.3* 8.4*  MG  --  2.2 2.1 2.3 2.3   GFR: Estimated Creatinine Clearance: 93.2 mL/min (by C-G formula based on SCr of 0.69 mg/dL). Liver Function Tests: No results for input(s): AST, ALT, ALKPHOS, BILITOT, PROT, ALBUMIN in the last 168 hours. No results for input(s): LIPASE, AMYLASE  in the last 168 hours. No results for input(s): AMMONIA in the last 168 hours. Coagulation Profile: No results for input(s): INR, PROTIME in the last 168 hours. Cardiac Enzymes: No results for input(s): CKTOTAL, CKMB, CKMBINDEX, TROPONINI in the last 168 hours. BNP (last 3 results) No results for input(s): PROBNP in the last 8760 hours. HbA1C: No results for input(s): HGBA1C in the last 72 hours. CBG: Recent Labs  Lab 10/02/17 1954 10/02/17 2342 10/03/17 0356 10/03/17 0828 10/03/17 1236  GLUCAP 223* 166* 146* 142* 115*   Lipid Profile: No results for input(s): CHOL, HDL,  LDLCALC, TRIG, CHOLHDL, LDLDIRECT in the last 72 hours. Thyroid Function Tests: No results for input(s): TSH, T4TOTAL, FREET4, T3FREE, THYROIDAB in the last 72 hours. Anemia Panel: No results for input(s): VITAMINB12, FOLATE, FERRITIN, TIBC, IRON, RETICCTPCT in the last 72 hours. Sepsis Labs: No results for input(s): PROCALCITON, LATICACIDVEN in the last 168 hours.  Recent Results (from the past 240 hour(s))  MRSA PCR Screening     Status: None   Collection Time: 09/27/17  4:47 PM  Result Value Ref Range Status   MRSA by PCR NEGATIVE NEGATIVE Final    Comment:        The GeneXpert MRSA Assay (FDA approved for NASAL specimens only), is one component of a comprehensive MRSA colonization surveillance program. It is not intended to diagnose MRSA infection nor to guide or monitor treatment for MRSA infections. Performed at [facility], [place] [address] [place], [place] [address]   MRSA PCR Screening     Status: None   Collection Time: 09/30/17  8:20 PM  Result Value Ref Range Status   MRSA by PCR NEGATIVE NEGATIVE Final    Comment:        The GeneXpert MRSA Assay (FDA approved for NASAL specimens only), is one component of a comprehensive MRSA colonization surveillance program. It is not intended to diagnose MRSA infection nor to guide or monitor treatment for MRSA infections. Performed at [facility], [place] [address], [place], [place] [address]          Radiology Studies: [name] NT Contrast  Result Date: 10/02/2017 CLINICAL DATA:  Tumor resection 09/30/2016. EXAM: MRI HEAD WITHOUT AND WITH CONTRAST TECHNIQUE: Multiplanar, multiecho pulse sequences of the brain and surrounding structures were obtained without and with intravenous contrast. CONTRAST:  32mL MULTIHANCE GADOBENATE DIMEGLUMINE 529 MG/ML IV SOLN COMPARISON:  Preoperative MRI of the brain 09/25/2017. FINDINGS: Brain: Left frontal craniotomy is noted. The bulk of the tumor has been resected. There  is some residual enhancement along the posterior margin of the surgical cavity as well as the superomedial and inferolateral margins. The deep margin demonstrates no significant enhancement. Posteriorly the area of enhancement extends obliquely 3.2 cm. Blood products are evident within the surgical cavity. Extent of surrounding vasogenic edema is not significantly changed. Mass effect is significantly relieved. There is still some asymmetric impact on the frontal horn of the left lateral ventricle. Sulcal effacement over the convexity is relieved. Minimal midline shift remains. Mild atrophy and white matter disease is otherwise stable. No distal enhancement is present. Expected enhancement along the dura is noted following surgery. Vascular: Flow is present in the major intracranial arteries. Skull and upper cervical spine: The skull base is within normal limits. Degenerative changes are noted in the upper cervical spine, most evident at C3-4 were central canal stenosis is present. Degenerative endplate changes are present. Marrow signal is otherwise normal. Sinuses/Orbits: The paranasal sinuses and  mastoid air cells are clear. Globes and orbits are within normal limits. IMPRESSION: 1. Near complete resection of tumor in the left frontal lobe. 2. Rim of residual enhancement posteriorly along the margin of the resection cavity with some enhancement superior medial and inferolateral. The majority of the surgical cavity does not enhance, suggesting some residual tumor posteriorly as described. 3. Surrounding vasogenic edema is stable with decreased mass effect. 4. Blood products are noted within the resection cavity. 5. Mild atrophy and white matter disease is otherwise stable. No acute infarct is present. Electronically Signed   By: San Morelle M.D.   On: 10/02/2017 12:48     Scheduled Meds: . dexamethasone  4 mg Oral Q6H  . insulin aspart  0-9 Units Subcutaneous TID WC  . levETIRAcetam  500 mg Oral  BID   Continuous Infusions:    LOS: 8 days        Velvet Bathe, MD [facility] Pager [phone]  If 7PM-7AM, please contact night-coverage [email or web address] Password [facility] 10/03/2017, 3:08 PM
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
## 2017-10-03 NOTE — Progress Notes (Signed)
Met with pt, daughter, and friend at bedside to discuss discharge planning.  Pt working up until [employer] of brain mass on 09/25/17.  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  Recommendation is for 24h supervision at discharge, and daughter is concerned about how she can do this and work to support pt, herself and her 79 year old son, who is in college.    I encouraged daughter to reach out to family and friends to see if they can provide assistance while she is working.  Daughter states most family lives out of town and work themselves, so she does not think they can assist.  Daughter states pt has applied for Medicaid, but she understands it may be some time before this is approved.  IF pt does get approved for Medicaid, he may qualify for PCS or CAPS services, which would also have to be applied for, and currently has a waiting list for these programs.  I explained these programs to daughter.  Pt DOES qualify for home health services such as home health PT and OT, who will provide a specific service and leave; they do not provide 24h supervision.  These services are generally out of pocket, and daughter states she doesn't think they can afford private duty care.    Because pt was considered part time status at his work at [employer], per daughter, he unfortunately does not qualify for any short or long term disability benefits.    PT/OT to reevaluate pt in AM; will continue to follow progress.  Pt may qualify for SNF, but this may be difficult if pt receiving chemo or radiation at discharge.  He may qualify for additional community programs; will investigate and follow up with pt/family.  Reinaldo Raddle, RN, BSN  Trauma/Neuro ICU Case Manager [phone]

## 2017-10-03 NOTE — Progress Notes (Signed)
Physical medicine rehab consult requested chart has been reviewed.  With physical therapy evaluation completed 10/03/2017 patient ambulating 600 feet without assistive device.  Hold on formal rehab consult at this time with recommendations of discharge to home.  Anticipate plan to home with family assistance and recommendations for outpatient therapies

## 2017-10-03 NOTE — Progress Notes (Signed)
Subjective: Patient resting in bed, comfortable.  Seen in radiation oncology and medical oncology consultation. Seen by PT and ST, both of whom have recommended CIR.  Will place PM&R consultation.  Objective: Vital signs in last 24 hours: Vitals:   10/03/17 0700 10/03/17 0800 10/03/17 0854 10/03/17 0900  BP:  (!) 156/90  (!) 153/79  Pulse: (!) 57 (!) 56 69 65  Resp: 13 (!) 9 14 16   Temp:   98 F (36.7 C)   TempSrc:      SpO2: 95% 97% 97% 94%  Weight:      Height:        Intake/Output from previous day: 03/20 0701 - 03/21 0700 In: 1260 [P.O.:1260] Out: 2025 [Urine:2025] Intake/Output this shift: Total I/O In: 240 [P.O.:240] Out: -   Physical Exam:  Awake and alert, following commands. Right hemiparesis less pronounced, less drift of right upper extremity.  CBC Recent Labs    10/02/17 0400 10/03/17 0510  WBC 12.8* 11.4*  HGB 13.2 13.4  HCT 38.1* 38.6*  PLT 170 166   BMET Recent Labs    10/02/17 0400 10/03/17 0510  NA 135 136  K 4.5 4.5  CL 101 100*  CO2 26 24  GLUCOSE 148* 144*  BUN 20 20  CREATININE 0.73 0.69  CALCIUM 8.3* 8.4*    Studies/Results: Mr Christopher Morris IO Contrast  Result Date: 10/02/2017 CLINICAL DATA:  Tumor resection 09/30/2016. EXAM: MRI HEAD WITHOUT AND WITH CONTRAST TECHNIQUE: Multiplanar, multiecho pulse sequences of the brain and surrounding structures were obtained without and with intravenous contrast. CONTRAST:  49mL MULTIHANCE GADOBENATE DIMEGLUMINE 529 MG/ML IV SOLN COMPARISON:  Preoperative MRI of the brain 09/25/2017. FINDINGS: Brain: Left frontal craniotomy is noted. The bulk of the tumor has been resected. There is some residual enhancement along the posterior margin of the surgical cavity as well as the superomedial and inferolateral margins. The deep margin demonstrates no significant enhancement. Posteriorly the area of enhancement extends obliquely 3.2 cm. Blood products are evident within the surgical cavity. Extent of surrounding  vasogenic edema is not significantly changed. Mass effect is significantly relieved. There is still some asymmetric impact on the frontal horn of the left lateral ventricle. Sulcal effacement over the convexity is relieved. Minimal midline shift remains. Mild atrophy and white matter disease is otherwise stable. No distal enhancement is present. Expected enhancement along the dura is noted following surgery. Vascular: Flow is present in the major intracranial arteries. Skull and upper cervical spine: The skull base is within normal limits. Degenerative changes are noted in the upper cervical spine, most evident at C3-4 were central canal stenosis is present. Degenerative endplate changes are present. Marrow signal is otherwise normal. Sinuses/Orbits: The paranasal sinuses and mastoid air cells are clear. Globes and orbits are within normal limits. IMPRESSION: 1. Near complete resection of tumor in the left frontal lobe. 2. Rim of residual enhancement posteriorly along the margin of the resection cavity with some enhancement superior medial and inferolateral. The majority of the surgical cavity does not enhance, suggesting some residual tumor posteriorly as described. 3. Surrounding vasogenic edema is stable with decreased mass effect. 4. Blood products are noted within the resection cavity. 5. Mild atrophy and white matter disease is otherwise stable. No acute infarct is present. Electronically Signed   By: San Morelle M.D.   On: 10/02/2017 12:48    Assessment/Plan: Continuing to do well following surgery. Appreciate consultations from Dr. Mickeal Skinner and radiation oncology.  PM&R consultation. We will begin gradual Decadron  taper tomorrow.   Hosie Spangle, MD 10/03/2017, 11:43 AM

## 2017-10-03 NOTE — Evaluation (Signed)
Speech Language Pathology Evaluation Patient Details Name: Christopher Morris MRN: [id number] DOB: 05-08-1941 Today's Date: 10/03/2017 Time: 9628-3662 SLP Time Calculation (min) (ACUTE ONLY): 35 min  Problem List:  Patient Active Problem List   Diagnosis Date Noted  . Seizure ([place]) 09/25/2017  . Brain tumor ([place]) 09/25/2017  . Hyperglycemia 09/25/2017  . Leukocytosis 09/25/2017  . Hypokalemia 09/25/2017  . BLOOD IN STOOL 07/26/2008  . ERECTILE DYSFUNCTION 07/12/2008   Past Medical History: History reviewed. No pertinent past medical history. Past Surgical History:  Past Surgical History:  Procedure Laterality Date  . APPLICATION OF CRANIAL NAVIGATION N/A 09/30/2017   Procedure: APPLICATION OF CRANIAL NAVIGATION;  Surgeon: [name], MD;  Location: [place] [facility];  Service: Neurosurgery;  Laterality: N/A;  . CRANIOTOMY N/A 09/30/2017   Procedure: CRANIOTOMY FOR RESECTION OF BRAIN TUMOR WITH STEREOTACTIC;  Surgeon: [name], MD;  Location: [facility];  Service: Neurosurgery;  Laterality: N/A;  CRANIOTOMY FOR RESECTION OF BRAIN TUMOR WITH STEREOTACTIC   HPI:  77 year old male with no past medical history presented with unsteady gait and difficulty talking for past several weeks and witnessed seizure on the day of presentation on 09/25/2017. MRI revealed left frontal mass with surrounding edema, glioblastoma multiforme. Neurosurgery was consulted. Started on intravenous Decadron and Keppra. Patient had surgery on 09/30/2017 by neurosurgery who reports Postoperative MRI  shows at least 95% resection of tumor, with most of the residual tumor located posteriorly, most adjacent to the primary motor cortex. Pt also being followed by oncology for f/u treatment.    Assessment / Plan / Recommendation Clinical Impression  Pt demonstrates aphasia with mild receptive and moderate expressive deficits. Pt is able to comprehend simple commands, Y/N questions and conversation, but has poor accuracy with  increased linguistic complexity or multistep commands. He is able to name basic, concrete objects in confrontational naming and responsive tasks, but struggles with word finding in abstract language. He cannot initiate conversational speech without question cues to simplify task. Responses to open ended questions even with cueing are effortful and repetitive. Motor speech is WNL, repetition and reading are fluent though there are occasional omissions at paragraph level likey due to visual scanning impairment. Micrographia observed with initial writing attempts. Recommend outpatient SLP f/u at d/c. Pt agreeable to plan.     SLP Assessment  SLP Recommendation/Assessment: Patient needs continued Speech Lanaguage Pathology Services SLP Visit Diagnosis: Aphasia (R47.01)    Follow Up Recommendations  Outpatient SLP;Inpatient Rehab    Frequency and Duration min 2x/week  2 weeks      SLP Evaluation Cognition  Arousal/Alertness: Awake/alert Orientation Level: Oriented to person;Oriented to situation;Oriented to place;Other (comment)(unable to state time due to aphasia) Attention: Sustained Sustained Attention: Appears intact Memory: Appears intact Awareness: Impaired Awareness Impairment: Emergent impairment       Comprehension  Auditory Comprehension Overall Auditory Comprehension: Impaired Yes/No Questions: Impaired Complex Questions: 50-74% accurate Commands: Impaired One Step Basic Commands: 75-100% accurate Two Step Basic Commands: 50-74% accurate Multistep Basic Commands: 0-24% accurate Conversation: Simple Visual Recognition/Discrimination Discrimination: Within Function Limits Reading Comprehension Reading Status: Impaired Word level: Not tested Sentence Level: Impaired Paragraph Level: Impaired Functional Environmental (signs, name badge): Not tested Interfering Components: Visual scanning    Expression Verbal Expression Overall Verbal Expression: Impaired Initiation: No  impairment Automatic Speech: Name;Social Response Level of Generative/Spontaneous Verbalization: Phrase Repetition: No impairment Naming: Impairment Responsive: 51-75% accurate Confrontation: Within functional limits Convergent: Not tested Divergent: Not tested Pragmatics: No impairment Effective Techniques: Semantic cues;Articulatory cues Written Expression Dominant Hand: Right  Written Expression: Exceptions to [facility] Dictation Ability: Word;Phrase Interfering Components: Micrographia;Legibility   Oral / Motor  Oral Motor/Sensory Function Overall Oral Motor/Sensory Function: Within functional limits Motor Speech Overall Motor Speech: Appears within functional limits for tasks assessed   GO                   [name], MA CCC-SLP [phone]  Lynann Beaver 10/03/2017, 9:50 AM

## 2017-10-03 NOTE — Evaluation (Addendum)
Occupational Therapy Evaluation Patient Details Name: Christopher Morris MRN: [id number] DOB: 08/08/1940 Today's Date: 10/03/2017    History of Present Illness 77 yo admitted after seizure found to have brain mass glioblastoma multiforme s/p left frontal crani 3/18 with tumor resection. unknown PMHx   Clinical Impression   Pt admitted with above. He demonstrates the below listed deficits and will benefit from continued OT to maximize safety and independence with BADLs.  Pt presents to OT with communication deficits, impaired cognition, Rt inattention, impaired balance.  He currently requires min A for ADLs, and was noted to frequently run into items on Rt while ambulating in hallways.  As he fatigued, he required up to mod A to prevent fall due to LOB and heavy veering to the Rt.  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  Daughter is main/only support, and is trying to arrange 24 hour assist at discharge.   He needs 24 hour assist/supervision at discharge.      Follow Up Recommendations  Supervision/Assistance - 24 hour;SNF (if daughter unable to provide necessary level of care);Home health OT    Equipment Recommendations  Tub/shower seat    Recommendations for Other Services       Precautions / Restrictions Precautions Precautions: Fall      Mobility Bed Mobility Overal bed mobility: Needs Assistance Bed Mobility: Supine to Sit;Sit to Supine     Supine to sit: Supervision Sit to supine: Supervision   General bed mobility comments: Pt requires increased effort to lift Rt LE into bed when returning to supine   Transfers Overall transfer level: Needs assistance Equipment used: None Transfers: Sit to/from [employer] Transfers Sit to Stand: Min guard Stand pivot transfers: Min guard       General transfer comment: Pt requires min guard assist to steady     Balance                                            ADL either performed or assessed with clinical judgement   ADL Overall ADL's : Needs assistance/impaired Eating/Feeding: Modified independent;Sitting   Grooming: Wash/dry hands;Wash/dry face;Oral care;Brushing hair;Min guard;Standing   Upper Body Bathing: Minimal assistance;Sitting   Lower Body Bathing: Minimal assistance;Sit to/from stand   Upper Body Dressing : Minimal assistance;Sitting   Lower Body Dressing: Minimal assistance;Sit to/from stand   Toilet Transfer: Ambulation;Comfort height toilet;Grab bars;Min guard   Toileting- Clothing Manipulation and Hygiene: Min guard;Sit to/from stand Toileting - Clothing Manipulation Details (indicate cue type and reason): Pt with leakage around condom cath.  Pt assisted with clean up      Functional mobility during ADLs: Min guard;Moderate assistance General ADL Comments: Pt able to ambulate in room with min guard assist, but required up to mod A when navigating busier environment      Vision Baseline Vision/History: Wears glasses Wears Glasses: At all times Patient Visual Report: No change from baseline(uncertain of accuracy ) Vision Assessment?: Yes Eye Alignment: Within Functional Limits Alignment/Gaze Preference: Within Defined Limits Tracking/Visual Pursuits: Other (comment) Visual Fields: No apparent deficits;Other (comment)(during confrontation testing ) Additional Comments: Pt able to read calendar on wall.  He was able to consistently locate objects during confrontation testing, however, frequently runs into objects on his Rt side while ambulating in the hallway.   He loses fixation frequently while performing pursuits.  Perception Perception Perception Tested?: Yes Perception Deficits: Inattention/neglect Inattention/Neglect: Does not attend to right visual field Spatial deficits: Pt frequently runs into items on the right while ambulating    Praxis Praxis Praxis tested?: Within functional limits     Pertinent Vitals/Pain Pain Assessment: No/denies pain     Hand Dominance Right   Extremity/Trunk Assessment Upper Extremity Assessment Upper Extremity Assessment: RUE deficits/detail RUE Deficits / Details: 4-/5   Lower Extremity Assessment Lower Extremity Assessment: Defer to PT evaluation   Cervical / Trunk Assessment Cervical / Trunk Assessment: Normal   Communication Communication Communication: Expressive difficulties;Receptive difficulties   Cognition Arousal/Alertness: Awake/alert Behavior During Therapy: WFL for tasks assessed/performed Overall Cognitive Status: Difficult to assess Area of Impairment: Attention;Following commands;Safety/judgement;Problem solving                   Current Attention Level: Sustained   Following Commands: Follows one step commands consistently(with gestures ) Safety/Judgement: Decreased awareness of deficits;Decreased awareness of safety   Problem Solving: Requires verbal cues;Difficulty sequencing General Comments: Pt able to sustain attention to complete simple ADL tasks.   He was able to utilize calendar for orientation.      General Comments  spoke with daughter re: current deficits and functional performance as well as need for 24 hour supervision/assist at discharge.      Exercises     Shoulder Instructions      Home Living Family/patient expects to be discharged to:: Private residence Living Arrangements: Children Available Help at Discharge: Family;Available PRN/intermittently Type of Home: Apartment Home Access: Level entry     Home Layout: One level     Bathroom Shower/Tub: Teacher, early years/pre: Standard     Home Equipment: None   Additional Comments: Pt's daughter is only support and working out how to provide 24 hour assist, but is very committed to doing so.   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         OT Problem List: Decreased strength;Decreased activity tolerance;Impaired balance (sitting and/or standing);Decreased cognition;Decreased safety awareness;Decreased knowledge of use of DME or AE;Impaired vision/perception;Impaired UE functional use      OT Treatment/Interventions: Self-care/ADL training;Neuromuscular education;DME and/or AE instruction;Therapeutic activities;Cognitive remediation/compensation;Visual/perceptual remediation/compensation;Patient/family education;Balance training    OT Goals(Current goals can be found in the care plan section) Acute Rehab OT Goals Patient Stated Goal: return home OT Goal Formulation: With patient/family Time For Goal Achievement: 10/17/17 Potential to Achieve Goals: Good ADL Goals Pt Will Perform Grooming: with supervision;standing Pt Will Perform Upper Body Bathing: with supervision;sitting Pt Will Perform Lower Body Bathing: with supervision;sit to/from stand Pt Will Perform Upper Body Dressing: with supervision;sitting Pt Will Perform Lower Body Dressing: with supervision;sit to/from stand Pt Will Transfer to Toilet: with supervision;ambulating;regular height toilet Pt Will Perform Toileting - Clothing Manipulation and hygiene: with supervision;sit to/from stand Additional ADL Goal #1: Pt will locate items on Rt side in busy, unstructured environment Additional ADL Goal #2: Pt will demonstrate ability to selectively attend to familiar ADL tasks with no cues  OT Frequency: Min 2X/week   Barriers to D/C:            Co-evaluation              AM-PAC PT "6 Clicks" Daily Activity     Outcome Measure Help from another person eating meals?: None Help from another person taking care of  personal grooming?: A Little Help from another person toileting, which includes using toliet, bedpan, or urinal?: A Little Help from another person bathing (including washing, rinsing, drying)?: A Little Help from  another person to put on and taking off regular upper body clothing?: A Little Help from another person to put on and taking off regular lower body clothing?: A Little 6 Click Score: 19   End of Session    Activity Tolerance: Patient tolerated treatment well Patient left: in chair;with call bell/phone within reach;in bed;with bed alarm set;with chair alarm set;with family/visitor present  OT Visit Diagnosis: Unsteadiness on feet (R26.81);Cognitive communication deficit (R41.841)                Time: 2080-2233 [id number] OT Time Calculation (min): 10 min and 30 mins  Charges:  OT General Charges $OT Visit: 2 Visit OT Evaluation $OT Eval Moderate Complexity: 1 Mod OT Treatments $Self Care/Home Management : 8-22 mins $Therapeutic Activity: 8-22 mins G-Codes:     [facility], OTR/L [phone]   Lucille Passy M 10/03/2017, 4:11 PM

## 2017-10-03 NOTE — Consult Note (Signed)
Radiation Oncology         [phone] ________________________________  Name: Christopher Morris        MRN: [id number]  Date of Service:10/03/17               DOB: March 01, 1941  CC:No primary care provider on file.  No ref. provider found     REFERRING PHYSICIAN: No ref. provider found   DIAGNOSIS: The primary encounter diagnosis was Seizure [facility]). Diagnoses of Brain tumor ([place]) and Vasogenic brain edema ([place]) were also pertinent to this visit.   HISTORY OF PRESENT ILLNESS: Christopher Morris is a 77 y.o. male seen at the request of Dr. Sherwood Gambler for a new diagnosis of glioblastoma of the left frontal lobe. The patient was brought to the hospital by EMS on 09/25/17 after a witnessed seizure. The patient was found on CT to have a possible 3 cm mass in the left frontal lobe with prominent vasogenic edema. There was slight rightward shift and effacement of the left lateral ventricle frontal horn. MRI on 09/25/17 also revealed this lesion and measured it at 36 x 28 x 39 mm and a small volume of late subacute hemorrhage within the central aspect of the mass was also noted. Trace local rightward shift was seen and he was seen by Dr. Sherwood Gambler. He was taken to the OR on 09/30/17 for craniotomy and resection. A post resection MRI on 10/02/17 revealed some residual enhancement along the superior medial and inferolateral aspect fo the cavity. The majority of the surgical cavity does not enhance. Surrounding edema is stable with decreased mass effect. He's healing well and we're asked to see the patient to introduce the options of radiotherapy. He is going to see Dr. Mickeal Skinner as well.      PREVIOUS RADIATION THERAPY: No   PAST MEDICAL HISTORY: History reviewed. No pertinent past medical history.     PAST SURGICAL HISTORY: Past Surgical History:  Procedure Laterality Date  . APPLICATION OF CRANIAL NAVIGATION N/A 09/30/2017   Procedure: APPLICATION OF CRANIAL NAVIGATION;  Surgeon: [name], MD;  Location:  [facility];  Service: Neurosurgery;  Laterality: N/A;  . CRANIOTOMY N/A 09/30/2017   Procedure: CRANIOTOMY FOR RESECTION OF BRAIN TUMOR WITH STEREOTACTIC;  Surgeon: [name], MD;  Location: [facility];  Service: Neurosurgery;  Laterality: N/A;  CRANIOTOMY FOR RESECTION OF BRAIN TUMOR WITH STEREOTACTIC     FAMILY HISTORY:  Family History  Problem Relation Age of Onset  . Cancer Neg Hx      SOCIAL HISTORY:  reports that he has never smoked. He has never used smokeless tobacco. He reports that he does not drink alcohol or use drugs.  The patient is divorced and lives in [place]. His daughter and 47 year old grandson live with him. 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   ALLERGIES: Pork-derived products and Shellfish allergy   MEDICATIONS:  Current Facility-Administered Medications  Medication Dose Route Frequency Provider Last Rate Last Dose  . acetaminophen (TYLENOL) tablet 650 mg  650 mg Oral Q6H PRN [name], NP       Or  . acetaminophen (TYLENOL) suppository 650 mg  650 mg Rectal Q6H PRN [name], NP      . dexamethasone (DECADRON) tablet 4 mg  4 mg Oral Q6H [name], MD   4 mg at 10/03/17 1330  . HYDROcodone-acetaminophen (NORCO/VICODIN) 5-325 MG per tablet 1-2 tablet  1-2 tablet Oral Q4H PRN [name], MD  1 tablet at 09/30/17 2340  . hydrOXYzine (VISTARIL) injection 50 mg  50 mg Intramuscular Q4H PRN [name], MD      . insulin aspart (novoLOG) injection 0-9 Units  0-9 Units Subcutaneous TID WC Velvet Bathe, MD      . levETIRAcetam (KEPPRA) tablet 500 mg  500 mg Oral BID [name], MD   500 mg at 10/03/17 1048  . ondansetron (ZOFRAN) tablet 4 mg  4 mg Oral Q6H PRN [name], NP       Or  . ondansetron [facility]) injection 4 mg  4 mg Intravenous Q6H PRN [name], NP      . senna-docusate (Senokot-S) tablet 1 tablet  1 tablet Oral QHS PRN [name], NP         REVIEW OF  SYSTEMS: On review of systems, the patient reports that he is doing well overall. He denies any headaches or incisional pain. He denies any chest pain, shortness of breath, cough, fevers, chills, night sweats, unintended weight changes. He denies any bowel or bladder disturbances, and denies abdominal pain, nausea or vomiting. He denies any new musculoskeletal or joint aches or pains. A complete review of systems is obtained and is otherwise negative.     PHYSICAL EXAM:  Wt Readings from Last 3 Encounters:  09/30/17 190 lb 14.7 oz (86.6 kg)  05/03/16 210 lb (95.3 kg)   Temp Readings from Last 3 Encounters:  10/03/17 98.5 F (36.9 C) (Oral)  05/03/16 98.4 F (36.9 C) (Oral)   BP Readings from Last 3 Encounters:  10/03/17 (!) 153/79  05/03/16 137/74   Pulse Readings from Last 3 Encounters:  10/03/17 65  05/03/16 68   Pain Assessment Pain Score: 0-No pain/10  In general this is a well appearing African American male in no acute distress. He is alert and oriented x4 and appropriate throughout the examination. HEENT reveals that the patient is normocephalic, atraumatic with a well healing craniotomy incision site along his frontal. EOMs are intact. PERRLA. Skin is intact without any evidence of gross lesions. Cardiovascular exam reveals normal effort, an no acute distress. His RUE has decreased strength, about 4/5, LUE is 5/5, and bilateral lower extremities are 5/5, with intact sensation to light touch equally.   ECOG = 3  0 - Asymptomatic (Fully active, able to carry on all predisease activities without restriction)  1 - Symptomatic but completely ambulatory (Restricted in physically strenuous activity but ambulatory and able to carry out work of a light or sedentary nature. For example, light housework, office work)  2 - Symptomatic, <50% in bed during the day (Ambulatory and capable of all self care but unable to carry out any work activities. Up and about more than 50% of waking  hours)  3 - Symptomatic, >50% in bed, but not bedbound (Capable of only limited self-care, confined to bed or chair 50% or more of waking hours)  4 - Bedbound (Completely disabled. Cannot carry on any self-care. Totally confined to bed or chair)  5 - Death   Eustace Pen MM, Creech RH, Tormey DC, et al. [phone]). "Toxicity and response criteria of the [facility]". [place] Oncol. 5 (6): 649-55    LABORATORY DATA:  Lab Results  Component Value Date   WBC 11.4 (H) 10/03/2017   HGB 13.4 10/03/2017   HCT 38.6 (L) 10/03/2017   MCV 97.0 10/03/2017   PLT 166 10/03/2017   Lab Results  Component Value Date   NA  136 10/03/2017   K 4.5 10/03/2017   CL 100 (L) 10/03/2017   CO2 24 10/03/2017   Lab Results  Component Value Date   ALT 14 (L) 09/25/2017   AST 24 09/25/2017   ALKPHOS 67 09/25/2017   BILITOT 1.2 09/25/2017      RADIOGRAPHY: Dg Chest 2 View  Result Date: 09/25/2017 CLINICAL DATA:  Seizure EXAM: CHEST - 2 VIEW COMPARISON:  May 03, 2016 FINDINGS: There is no edema or consolidation. The heart size and pulmonary vascularity are normal. No adenopathy. No pneumothorax. No bone lesions. IMPRESSION: No edema or consolidation. Electronically Signed   By: Lowella Grip III M.D.   On: 09/25/2017 09:09   Ct Head Wo Contrast  Result Date: 09/27/2017 CLINICAL DATA:  First-time seizure. Intracranial mass lesion. Brain lab protocol prior to resection. EXAM: CT HEAD WITHOUT CONTRAST TECHNIQUE: Contiguous axial images were obtained from the base of the skull through the vertex without intravenous contrast. COMPARISON:  CT and MRI 09/25/2017 FINDINGS: Brain: BrainLAB protocol for operative utilization. Redemonstration of a necrotic mass in the medial left frontal vertex region with transverse diameter of 3.5 cm and cephalo caudal extent of 3.5-4 cm. Vasogenic edema in the left frontal lobe as seen previously. Small focus of hemorrhage along the superior margin of the  tumor. No evidence of increased bleeding. No second lesion is evident. Mass effect results an left-to-right shift of 2 mm. No hydrocephalus. Vascular: There is atherosclerotic calcification of the major vessels at the base of the brain. Skull: Negative Sinuses/Orbits: Clear/normal Other: None IMPRESSION: BrainLAB protocol for operative utilization. Necrotic left posterior frontal vertex mass 3.5-4 cm in diameter as seen previously. Edema pattern is the same. Small amount of hemorrhage within the superior brim of the tumor is the same. Mass-effect is the same, with left-to-right shift of 2 mm. Electronically Signed   By: Nelson Chimes M.D.   On: 09/27/2017 09:06   Ct Head Wo Contrast  Result Date: 09/25/2017 CLINICAL DATA:  Seizure and fall out of bed. EXAM: CT HEAD WITHOUT CONTRAST CT CERVICAL SPINE WITHOUT CONTRAST TECHNIQUE: Multidetector CT imaging of the head and cervical spine was performed following the standard protocol without intravenous contrast. Multiplanar CT image reconstructions of the cervical spine were also generated. COMPARISON:  None. FINDINGS: CT HEAD FINDINGS Brain: There is a possible 3.0 cm mass in the left frontal lobe with prominent surrounding vasogenic edema. Gray-white matter differentiation is preserved. Small amount of hyperdensity along the superior aspect of the mass may reflect hemorrhage. There is slight rightward shift of the anterior falx and effacement of the left lateral ventricle frontal horn. No hydrocephalus or extra-axial collection. Vascular: Atherosclerotic vascular calcification of the carotid siphons. No hyperdense vessel. Skull: Normal. Negative for fracture or focal lesion. Sinuses/Orbits: No acute finding. Other: None. CT CERVICAL SPINE FINDINGS Alignment: Reversal of the normal cervical lordosis. No traumatic malalignment. Skull base and vertebrae: No acute fracture. No primary bone lesion or focal pathologic process. Soft tissues and spinal canal: No prevertebral  fluid or swelling. No visible canal hematoma. Disc levels: Moderate to severe degenerative disc disease and facet uncovertebral hypertrophy throughout the cervical spine. Multilevel moderate to severe neuroforaminal stenoses. Upper chest: Negative. Other: None. IMPRESSION: 1. Probable 3.0 cm mass in the left frontal lobe with prominent surrounding vasogenic edema. Small amount of hyperdensity along the superior aspect of the mass likely reflects a hemorrhagic component. Findings less likely represent hemorrhagic infarct given relative preservation of the gray-white differentiation. Recommend contrast-enhanced MRI of the  brain for further evaluation. 2. No acute cervical spine fracture. Moderate to severe degenerative changes throughout the cervical spine. Critical Value/emergent results were called by telephone at the time of interpretation on 09/25/2017 at 9:12 am to Dr. Isla Pence , who verbally acknowledged these results. Electronically Signed   By: Titus Dubin M.D.   On: 09/25/2017 09:17   Ct Chest W Contrast  Result Date: 09/25/2017 CLINICAL DATA:  Brain mass.  Evaluate for primary neoplasm. EXAM: CT CHEST, ABDOMEN, AND PELVIS WITH CONTRAST TECHNIQUE: Multidetector CT imaging of the chest, abdomen and pelvis was performed following the standard protocol during bolus administration of intravenous contrast. CONTRAST:  155mL ISOVUE-300 IOPAMIDOL (ISOVUE-300) INJECTION 61% COMPARISON:  None FINDINGS: CT CHEST FINDINGS Cardiovascular: The heart size appears within normal limits. Aortic atherosclerosis. No pericardial effusion. Mediastinum/Nodes: Normal appearance of the thyroid gland. The trachea appears patent and is midline. Normal appearance of the esophagus. No enlarged mediastinal or hilar lymph nodes. Lungs/Pleura: No pleural effusion. Mild subsegmental atelectasis or scar noted in the lung bases. No suspicious pulmonary nodule or mass identified. Musculoskeletal: No aggressive lytic or sclerotic  bone lesions. CT ABDOMEN PELVIS FINDINGS Hepatobiliary: No focal liver abnormality is seen. No gallstones, gallbladder wall thickening, or biliary dilatation. Pancreas: Unremarkable. No pancreatic ductal dilatation or surrounding inflammatory changes. Spleen: Normal in size without focal abnormality. Adrenals/Urinary Tract: Normal appearance of the adrenal glands. The kidneys are both unremarkable. Urinary bladder appears normal. Stomach/Bowel: Stomach normal. The small bowel loops have a normal course and caliber. The appendix is visualized and appears normal. Unremarkable appearance of the colon. Vascular/Lymphatic: Aortic atherosclerosis. No aneurysm. No abdominal adenopathy. No pelvic or inguinal adenopathy. Reproductive: Prostate gland is unremarkable. Other: No free fluid or fluid collections. No peritoneal nodularity. 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 No aggressive lytic or sclerotic bone lesion. IMPRESSION: 1. No findings identified to indicate site of primary neoplastic process to explain brain mass. 2.  Aortic Atherosclerosis (ICD10-I70.0). Electronically Signed   By: Kerby Moors M.D.   On: 09/25/2017 14:53   Ct Cervical Spine Wo Contrast  Result Date: 09/25/2017 CLINICAL DATA:  Seizure and fall out of bed. EXAM: CT HEAD WITHOUT CONTRAST CT CERVICAL SPINE WITHOUT CONTRAST TECHNIQUE: Multidetector CT imaging of the head and cervical spine was performed following the standard protocol without intravenous contrast. Multiplanar CT image reconstructions of the cervical spine were also generated. COMPARISON:  None. FINDINGS: CT HEAD FINDINGS Brain: There is a possible 3.0 cm mass in the left frontal lobe with prominent surrounding vasogenic edema. Gray-white matter differentiation is preserved. Small amount of hyperdensity along the superior aspect of the mass may reflect hemorrhage. There is slight rightward shift of the anterior falx and effacement of the  left lateral ventricle frontal horn. No hydrocephalus or extra-axial collection. Vascular: Atherosclerotic vascular calcification of the carotid siphons. No hyperdense vessel. Skull: Normal. Negative for fracture or focal lesion. Sinuses/Orbits: No acute finding. Other: None. CT CERVICAL SPINE FINDINGS Alignment: Reversal of the normal cervical lordosis. No traumatic malalignment. Skull base and vertebrae: No acute fracture. No primary bone lesion or focal pathologic process. Soft tissues and spinal canal: No prevertebral fluid or swelling. No visible canal hematoma. Disc levels: Moderate to severe degenerative disc disease and facet uncovertebral hypertrophy throughout the cervical spine. Multilevel moderate to severe neuroforaminal stenoses. Upper chest: Negative. Other: None. IMPRESSION: 1. Probable 3.0 cm mass in the left frontal lobe with prominent surrounding vasogenic edema. Small amount of hyperdensity along the superior aspect of the  mass likely reflects a hemorrhagic component. Findings less likely represent hemorrhagic infarct given relative preservation of the gray-white differentiation. Recommend contrast-enhanced MRI of the brain for further evaluation. 2. No acute cervical spine fracture. Moderate to severe degenerative changes throughout the cervical spine. Critical Value/emergent results were called by telephone at the time of interpretation on 09/25/2017 at 9:12 am to Dr. Isla Pence , who verbally acknowledged these results. Electronically Signed   By: Titus Dubin M.D.   On: 09/25/2017 09:17   [name] HQ Contrast  Result Date: 10/02/2017 CLINICAL DATA:  Tumor resection 09/30/2016. EXAM: MRI HEAD WITHOUT AND WITH CONTRAST TECHNIQUE: Multiplanar, multiecho pulse sequences of the brain and surrounding structures were obtained without and with intravenous contrast. CONTRAST:  8mL MULTIHANCE GADOBENATE DIMEGLUMINE 529 MG/ML IV SOLN COMPARISON:  Preoperative MRI of the brain 09/25/2017.  FINDINGS: Brain: Left frontal craniotomy is noted. The bulk of the tumor has been resected. There is some residual enhancement along the posterior margin of the surgical cavity as well as the superomedial and inferolateral margins. The deep margin demonstrates no significant enhancement. Posteriorly the area of enhancement extends obliquely 3.2 cm. Blood products are evident within the surgical cavity. Extent of surrounding vasogenic edema is not significantly changed. Mass effect is significantly relieved. There is still some asymmetric impact on the frontal horn of the left lateral ventricle. Sulcal effacement over the convexity is relieved. Minimal midline shift remains. Mild atrophy and white matter disease is otherwise stable. No distal enhancement is present. Expected enhancement along the dura is noted following surgery. Vascular: Flow is present in the major intracranial arteries. Skull and upper cervical spine: The skull base is within normal limits. Degenerative changes are noted in the upper cervical spine, most evident at C3-4 were central canal stenosis is present. Degenerative endplate changes are present. Marrow signal is otherwise normal. Sinuses/Orbits: The paranasal sinuses and mastoid air cells are clear. Globes and orbits are within normal limits. IMPRESSION: 1. Near complete resection of tumor in the left frontal lobe. 2. Rim of residual enhancement posteriorly along the margin of the resection cavity with some enhancement superior medial and inferolateral. The majority of the surgical cavity does not enhance, suggesting some residual tumor posteriorly as described. 3. Surrounding vasogenic edema is stable with decreased mass effect. 4. Blood products are noted within the resection cavity. 5. Mild atrophy and white matter disease is otherwise stable. No acute infarct is present. Electronically Signed   By: San Morelle M.D.   On: 10/02/2017 12:48   [name] IO Contrast  Result  Date: 09/25/2017 CLINICAL DATA:  77 year old male found down by bed. Possible seizure, incontinent. Combative. EXAM: MRI HEAD WITHOUT AND WITH CONTRAST TECHNIQUE: Multiplanar, multiecho pulse sequences of the brain and surrounding structures were obtained without and with intravenous contrast. CONTRAST:  42mL MULTIHANCE GADOBENATE DIMEGLUMINE 529 MG/ML IV SOLN COMPARISON:  Head CT without contrast 0849 hours today. FINDINGS: Brain: Partially hemorrhagic mass involving the medial left superior frontal gyrus, and left cingulate gyrus. The mass encompasses 36 x 28 x 39 millimeters (AP by transverse by CC), is heterogeneously T2 hyperintense, and solidly enhancing. The medial margin of the mass does abut the interhemispheric fissure, but the lesion appears intra-axial on all sequences. There is a small volume of late subacute appearing hemorrhage within the central aspect of the mass (intrinsic T1 hyperintensity on series 14, image 37) with surrounding hemosiderin. There is associated blood product diffusion artifact. There is restricted diffusion along the peripheral margins of  the mass suggesting hyper cellularity. There is confluent surrounding T2 and FLAIR hyperintensity in a pattern suggesting vasogenic edema. There is mild regional mass effect, mostly on the anterior body of the left lateral ventricle. There is trace local rightward midline shift. There is no larger area of restricted diffusion about the mass. No diffusion restriction elsewhere. No ventriculomegaly. No extra-axial or intraventricular blood. Basilar cisterns are normal. No other abnormal intracranial enhancement is identified. No definite dural thickening. No other cerebral edema or mass effect. Outside of the anterior left frontal lobe there is mild for age symmetric bilateral nonspecific periventricular white matter T2 and FLAIR hyperintensity. No cortical encephalomalacia or definite chronic cerebral blood products. There is mild for age T2  heterogeneity in the bilateral deep gray matter nuclei. The brainstem and cerebellum appear normal. Cervicomedullary junction and pituitary are within normal limits. Vascular: Major intracranial vascular flow voids are preserved. Skull and upper cervical spine: Advanced degenerative changes in the visible cervical spine. Visualized bone marrow signal is within normal limits. Negative visible cervical spinal cord aside from mild degenerative stenosis. Sinuses/Orbits: Negative. Other: Visible internal auditory structures appear normal. Scalp and face soft tissues appear negative. IMPRESSION: 1. Solitary 3.9 cm intra-axial, enhancing, and partially hemorrhagic mass of the medial left superior frontal gyrus affecting the adjacent left cingulate gyrus. Surrounding vasogenic edema versus nonenhancing tumor. Mild regional mass effect. Top differential considerations are solitary metastasis and high-grade glioma. CNS lymphoma is less likely. 2. No superimposed infarct or other abnormal brain enhancement. 3. Mild for age signal changes in the cerebral white matter and deep gray matter compatible with chronic small vessel disease. Electronically Signed   By: Genevie Ann M.D.   On: 09/25/2017 12:39   Ct Abdomen Pelvis W Contrast  Result Date: 09/25/2017 CLINICAL DATA:  Brain mass.  Evaluate for primary neoplasm. EXAM: CT CHEST, ABDOMEN, AND PELVIS WITH CONTRAST TECHNIQUE: Multidetector CT imaging of the chest, abdomen and pelvis was performed following the standard protocol during bolus administration of intravenous contrast. CONTRAST:  148mL ISOVUE-300 IOPAMIDOL (ISOVUE-300) INJECTION 61% COMPARISON:  None FINDINGS: CT CHEST FINDINGS Cardiovascular: The heart size appears within normal limits. Aortic atherosclerosis. No pericardial effusion. Mediastinum/Nodes: Normal appearance of the thyroid gland. The trachea appears patent and is midline. Normal appearance of the esophagus. No enlarged mediastinal or hilar lymph nodes.  Lungs/Pleura: No pleural effusion. Mild subsegmental atelectasis or scar noted in the lung bases. No suspicious pulmonary nodule or mass identified. Musculoskeletal: No aggressive lytic or sclerotic bone lesions. CT ABDOMEN PELVIS FINDINGS Hepatobiliary: No focal liver abnormality is seen. No gallstones, gallbladder wall thickening, or biliary dilatation. Pancreas: Unremarkable. No pancreatic ductal dilatation or surrounding inflammatory changes. Spleen: Normal in size without focal abnormality. Adrenals/Urinary Tract: Normal appearance of the adrenal glands. The kidneys are both unremarkable. Urinary bladder appears normal. Stomach/Bowel: Stomach normal. The small bowel loops have a normal course and caliber. The appendix is visualized and appears normal. Unremarkable appearance of the colon. Vascular/Lymphatic: Aortic atherosclerosis. No aneurysm. No abdominal adenopathy. No pelvic or inguinal adenopathy. Reproductive: Prostate gland is unremarkable. Other: No free fluid or fluid collections. No peritoneal nodularity. 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 No aggressive lytic or sclerotic bone lesion. IMPRESSION: 1. No findings identified to indicate site of primary neoplastic process to explain brain mass. 2.  Aortic Atherosclerosis (ICD10-I70.0). Electronically Signed   By: Kerby Moors M.D.   On: 09/25/2017 14:53       IMPRESSION/PLAN: 1. Glioblastoma of the left frontal lobe.  The patient's case will be discussed in our multidisciplinary brain and spine conference next Wednesday, 10/09/2017.  I reviewed his case, and Dr. Mickeal Skinner has also weighed in.  He has undergone surgical resection.  His post surgical MRI does reveal some concerns for residual disease.  He appears to be a good candidate for adjuvant therapy, and we discussed briefly the options of radiotherapy in conjunction with temozolomide.  Dr. Mickeal Skinner will also see and discuss his thoughts regarding  combination therapy.  The patient will be set up for an outpatient appointment to review recommendations for the consideration of radiotherapy but would not begin this until at least 2 weeks postoperatively.  The patient is in agreement and understanding.  We briefly reviewed the risks, benefits, delivery and logistics of therapy.  He seems to be interested in participating in this.  He will continue his dexamethasone as currently being administered, and taper will be scheduled per Dr. Mickeal Skinner as an outpatient. 2. Anticonvulsant therapy.  As the patient had a seizure at his presentation, he will continue Keppra per Dr. Mickeal Skinner and is aware that he cannot drive until he is 6 months seizure-free.  This will continue to be discussed at appropriate intervals.  The above documentation reflects my direct findings during this shared patient visit. Please see the separate note by Dr. Lisbeth Renshaw on this date for the remainder of the patient's plan of care.    Carola Rhine, PAC

## 2017-10-03 NOTE — Consult Note (Signed)
[facility] CONSULT NOTE  CHIEF COMPLAINTS/PURPOSE OF CONSULTATION:  Brain Tumor Seizure  HISTORY OF PRESENTING ILLNESS:  Christopher Morris 77 y.o. male presented to medical attention with a seizure, characterized in the EMR by loss of consciousness and urinary incontinence, followed by post ictal right sided weakness.  This led to uncovering of a left frontal mass on CT/MRI.  After negative systemic workup, the mass was resected near totally on 3/18 by Dr. Sherwood Gambler and described as glioblastoma on histology.  Following surgery, he was transitioned to the ICU where he has no formal complaints.  He does acknowledge some improvement in his right sided weakness, but overall is having some difficulty communicating and answering questions thoroughly.     MEDICAL HISTORY:  History reviewed. No pertinent past medical history.  SURGICAL HISTORY: Past Surgical History:  Procedure Laterality Date  . APPLICATION OF CRANIAL NAVIGATION N/A 09/30/2017   Procedure: APPLICATION OF CRANIAL NAVIGATION;  Surgeon: [name], MD;  Location: [facility];  Service: Neurosurgery;  Laterality: N/A;  . CRANIOTOMY N/A 09/30/2017   Procedure: CRANIOTOMY FOR RESECTION OF BRAIN TUMOR WITH STEREOTACTIC;  Surgeon: [name], MD;  Location: [facility];  Service: Neurosurgery;  Laterality: N/A;  CRANIOTOMY FOR RESECTION OF BRAIN TUMOR WITH STEREOTACTIC    SOCIAL HISTORY: Social History   Socioeconomic History  . Marital status: Divorced    Spouse name: Not on file  . Number of children: Not on file  . Years of education: Not on file  . Highest education level: Not on file  Occupational History  . Not on file  Social Needs  . Financial resource strain: Not on file  . Food insecurity:    Worry: Not on file    Inability: Not on file  . Transportation needs:    Medical: No    Non-medical: No  Tobacco Use  . Smoking status: Never Smoker  . Smokeless tobacco: Never Used  Substance and  Sexual Activity  . Alcohol use: No    Frequency: Never  . Drug use: No  . Sexual activity: Not Currently  Lifestyle  . Physical activity:    Days per week: 5 days    Minutes per session: Not on file  . Stress: Only a little  Relationships  . Social connections:    Talks on phone: Not on file    Gets together: Not on file    Attends religious service: Not on file    Active member of club or organization: Not on file    Attends meetings of clubs or organizations: Not on file    Relationship status: Not on file  . Intimate partner violence:    Fear of current or ex partner: Not on file    Emotionally abused: Not on file    Physically abused: Not on file    Forced sexual activity: Not on file  Other Topics Concern  . Not on file  Social History Narrative  . Not on file    FAMILY HISTORY: History reviewed. No pertinent family history.  ALLERGIES:  is allergic to pork-derived products and shellfish allergy.  MEDICATIONS:  Current Facility-Administered Medications  Medication Dose Route Frequency Provider Last Rate Last Dose  . acetaminophen (TYLENOL) tablet 650 mg  650 mg Oral Q6H PRN [name], NP       Or  . acetaminophen (TYLENOL) suppository 650 mg  650 mg Rectal Q6H PRN [name], NP      . dexamethasone (DECADRON) tablet 4  mg  4 mg Oral Q6H [name], MD   4 mg at 10/03/17 0541  . HYDROcodone-acetaminophen (NORCO/VICODIN) 5-325 MG per tablet 1-2 tablet  1-2 tablet Oral Q4H PRN [name], MD   1 tablet at 09/30/17 2340  . hydrOXYzine (VISTARIL) injection 50 mg  50 mg Intramuscular Q4H PRN [name], MD      . levETIRAcetam (KEPPRA) tablet 500 mg  500 mg Oral BID [name], MD   500 mg at 10/02/17 2300  . ondansetron (ZOFRAN) tablet 4 mg  4 mg Oral Q6H PRN [name], NP       Or  . ondansetron [facility]) injection 4 mg  4 mg Intravenous Q6H PRN [name], NP      . senna-docusate (Senokot-S) tablet 1 tablet   1 tablet Oral QHS PRN [name], NP        REVIEW OF SYSTEMS:   Limited by AMS  PHYSICAL EXAMINATION: Vitals:   10/03/17 0600 10/03/17 0700  BP:    Pulse: (!) 58 (!) 57  Resp: 12 13  Temp:    SpO2: 96% 95%   KPS: 60. General: Alert, cooperative, pleasant, in no acute distress Head: Craniotomy scar noted, dry and intact. EENT: No conjunctival injection or scleral icterus. Oral mucosa moist Lungs: Resp effort normal Cardiac: Regular rate and rhythm Abdomen: Soft, non-distended abdomen Skin: No rashes cyanosis or petechiae. Extremities: No clubbing or edema  NEUROLOGIC EXAM: Mental Status: Awake, alert, attentive to examiner. Oriented to grossly self and environment.  Noted impairment in fluency with higher level questions, repetition preserved. More advanced mental status testing limited by dysphasia.  Cranial Nerves: Visual acuity is grossly normal. Visual fields are full. Extra-ocular movements intact. No ptosis. Face is symmetric, tongue midline. Motor: Tone and bulk are normal. Power is full in both arms and legs today. Reflexes are symmetric, no pathologic reflexes present. Intact finger to nose bilaterally Sensory: Intact to light touch and temperature Gait: Deferred   LABORATORY DATA:  I have reviewed the data as listed Lab Results  Component Value Date   WBC 11.4 (H) 10/03/2017   HGB 13.4 10/03/2017   HCT 38.6 (L) 10/03/2017   MCV 97.0 10/03/2017   PLT 166 10/03/2017   Recent Labs    09/25/17 0834  09/30/17 0919 10/02/17 0400 10/03/17 0510  NA 139   < > 137 135 136  K 3.4*   < > 4.9 4.5 4.5  CL 104   < > 102 101 100*  CO2 22   < > '27 26 24  '$ GLUCOSE 192*   < > 147* 148* 144*  BUN 11   < > '17 20 20  '$ CREATININE 0.95   < > 0.87 0.73 0.69  CALCIUM 8.9   < > 8.6* 8.3* 8.4*  GFRNONAA >60   < > >60 >60 >60  GFRAA >60   < > >60 >60 >60  PROT 6.5  --   --   --   --   ALBUMIN 3.9  --   --   --   --   AST 24  --   --   --   --   ALT 14*  --   --   --    --   ALKPHOS 67  --   --   --   --   BILITOT 1.2  --   --   --   --    < > = values in this  interval not displayed.    RADIOGRAPHIC STUDIES: I have personally reviewed the radiological images as listed and agreed with the findings in the report. Dg Chest 2 View  Result Date: 09/25/2017 CLINICAL DATA:  Seizure EXAM: CHEST - 2 VIEW COMPARISON:  May 03, 2016 FINDINGS: There is no edema or consolidation. The heart size and pulmonary vascularity are normal. No adenopathy. No pneumothorax. No bone lesions. IMPRESSION: No edema or consolidation. Electronically Signed   By: Lowella Grip III M.D.   On: 09/25/2017 09:09   Ct Head Wo Contrast  Result Date: 09/27/2017 CLINICAL DATA:  First-time seizure. Intracranial mass lesion. Brain lab protocol prior to resection. EXAM: CT HEAD WITHOUT CONTRAST TECHNIQUE: Contiguous axial images were obtained from the base of the skull through the vertex without intravenous contrast. COMPARISON:  CT and MRI 09/25/2017 FINDINGS: Brain: BrainLAB protocol for operative utilization. Redemonstration of a necrotic mass in the medial left frontal vertex region with transverse diameter of 3.5 cm and cephalo caudal extent of 3.5-4 cm. Vasogenic edema in the left frontal lobe as seen previously. Small focus of hemorrhage along the superior margin of the tumor. No evidence of increased bleeding. No second lesion is evident. Mass effect results an left-to-right shift of 2 mm. No hydrocephalus. Vascular: There is atherosclerotic calcification of the major vessels at the base of the brain. Skull: Negative Sinuses/Orbits: Clear/normal Other: None IMPRESSION: BrainLAB protocol for operative utilization. Necrotic left posterior frontal vertex mass 3.5-4 cm in diameter as seen previously. Edema pattern is the same. Small amount of hemorrhage within the superior brim of the tumor is the same. Mass-effect is the same, with left-to-right shift of 2 mm. Electronically Signed   By: Nelson Chimes M.D.   On: 09/27/2017 09:06   Ct Head Wo Contrast  Result Date: 09/25/2017 CLINICAL DATA:  Seizure and fall out of bed. EXAM: CT HEAD WITHOUT CONTRAST CT CERVICAL SPINE WITHOUT CONTRAST TECHNIQUE: Multidetector CT imaging of the head and cervical spine was performed following the standard protocol without intravenous contrast. Multiplanar CT image reconstructions of the cervical spine were also generated. COMPARISON:  None. FINDINGS: CT HEAD FINDINGS Brain: There is a possible 3.0 cm mass in the left frontal lobe with prominent surrounding vasogenic edema. Gray-white matter differentiation is preserved. Small amount of hyperdensity along the superior aspect of the mass may reflect hemorrhage. There is slight rightward shift of the anterior falx and effacement of the left lateral ventricle frontal horn. No hydrocephalus or extra-axial collection. Vascular: Atherosclerotic vascular calcification of the carotid siphons. No hyperdense vessel. Skull: Normal. Negative for fracture or focal lesion. Sinuses/Orbits: No acute finding. Other: None. CT CERVICAL SPINE FINDINGS Alignment: Reversal of the normal cervical lordosis. No traumatic malalignment. Skull base and vertebrae: No acute fracture. No primary bone lesion or focal pathologic process. Soft tissues and spinal canal: No prevertebral fluid or swelling. No visible canal hematoma. Disc levels: Moderate to severe degenerative disc disease and facet uncovertebral hypertrophy throughout the cervical spine. Multilevel moderate to severe neuroforaminal stenoses. Upper chest: Negative. Other: None. IMPRESSION: 1. Probable 3.0 cm mass in the left frontal lobe with prominent surrounding vasogenic edema. Small amount of hyperdensity along the superior aspect of the mass likely reflects a hemorrhagic component. Findings less likely represent hemorrhagic infarct given relative preservation of the gray-white differentiation. Recommend contrast-enhanced MRI of the brain  for further evaluation. 2. No acute cervical spine fracture. Moderate to severe degenerative changes throughout the cervical spine. Critical Value/emergent results were called by telephone at the  time of interpretation on 09/25/2017 at 9:12 am to Dr. Isla Pence , who verbally acknowledged these results. Electronically Signed   By: Titus Dubin M.D.   On: 09/25/2017 09:17   Ct Chest W Contrast  Result Date: 09/25/2017 CLINICAL DATA:  Brain mass.  Evaluate for primary neoplasm. EXAM: CT CHEST, ABDOMEN, AND PELVIS WITH CONTRAST TECHNIQUE: Multidetector CT imaging of the chest, abdomen and pelvis was performed following the standard protocol during bolus administration of intravenous contrast. CONTRAST:  146m ISOVUE-300 IOPAMIDOL (ISOVUE-300) INJECTION 61% COMPARISON:  None FINDINGS: CT CHEST FINDINGS Cardiovascular: The heart size appears within normal limits. Aortic atherosclerosis. No pericardial effusion. Mediastinum/Nodes: Normal appearance of the thyroid gland. The trachea appears patent and is midline. Normal appearance of the esophagus. No enlarged mediastinal or hilar lymph nodes. Lungs/Pleura: No pleural effusion. Mild subsegmental atelectasis or scar noted in the lung bases. No suspicious pulmonary nodule or mass identified. Musculoskeletal: No aggressive lytic or sclerotic bone lesions. CT ABDOMEN PELVIS FINDINGS Hepatobiliary: No focal liver abnormality is seen. No gallstones, gallbladder wall thickening, or biliary dilatation. Pancreas: Unremarkable. No pancreatic ductal dilatation or surrounding inflammatory changes. Spleen: Normal in size without focal abnormality. Adrenals/Urinary Tract: Normal appearance of the adrenal glands. The kidneys are both unremarkable. Urinary bladder appears normal. Stomach/Bowel: Stomach normal. The small bowel loops have a normal course and caliber. The appendix is visualized and appears normal. Unremarkable appearance of the colon. Vascular/Lymphatic: Aortic  atherosclerosis. No aneurysm. No abdominal adenopathy. No pelvic or inguinal adenopathy. Reproductive: Prostate gland is unremarkable. Other: No free fluid or fluid collections. No peritoneal nodularity. 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 No aggressive lytic or sclerotic bone lesion. IMPRESSION: 1. No findings identified to indicate site of primary neoplastic process to explain brain mass. 2.  Aortic Atherosclerosis (ICD10-I70.0). Electronically Signed   By: TKerby MoorsM.D.   On: 09/25/2017 14:53   Ct Cervical Spine Wo Contrast  Result Date: 09/25/2017 CLINICAL DATA:  Seizure and fall out of bed. EXAM: CT HEAD WITHOUT CONTRAST CT CERVICAL SPINE WITHOUT CONTRAST TECHNIQUE: Multidetector CT imaging of the head and cervical spine was performed following the standard protocol without intravenous contrast. Multiplanar CT image reconstructions of the cervical spine were also generated. COMPARISON:  None. FINDINGS: CT HEAD FINDINGS Brain: There is a possible 3.0 cm mass in the left frontal lobe with prominent surrounding vasogenic edema. Gray-white matter differentiation is preserved. Small amount of hyperdensity along the superior aspect of the mass may reflect hemorrhage. There is slight rightward shift of the anterior falx and effacement of the left lateral ventricle frontal horn. No hydrocephalus or extra-axial collection. Vascular: Atherosclerotic vascular calcification of the carotid siphons. No hyperdense vessel. Skull: Normal. Negative for fracture or focal lesion. Sinuses/Orbits: No acute finding. Other: None. CT CERVICAL SPINE FINDINGS Alignment: Reversal of the normal cervical lordosis. No traumatic malalignment. Skull base and vertebrae: No acute fracture. No primary bone lesion or focal pathologic process. Soft tissues and spinal canal: No prevertebral fluid or swelling. No visible canal hematoma. Disc levels: Moderate to severe degenerative disc disease and  facet uncovertebral hypertrophy throughout the cervical spine. Multilevel moderate to severe neuroforaminal stenoses. Upper chest: Negative. Other: None. IMPRESSION: 1. Probable 3.0 cm mass in the left frontal lobe with prominent surrounding vasogenic edema. Small amount of hyperdensity along the superior aspect of the mass likely reflects a hemorrhagic component. Findings less likely represent hemorrhagic infarct given relative preservation of the gray-white differentiation. Recommend contrast-enhanced MRI of the brain for further evaluation.  2. No acute cervical spine fracture. Moderate to severe degenerative changes throughout the cervical spine. Critical Value/emergent results were called by telephone at the time of interpretation on 09/25/2017 at 9:12 am to Dr. Isla Pence , who verbally acknowledged these results. Electronically Signed   By: Titus Dubin M.D.   On: 09/25/2017 09:17   [name] SH Contrast  Result Date: 10/02/2017 CLINICAL DATA:  Tumor resection 09/30/2016. EXAM: MRI HEAD WITHOUT AND WITH CONTRAST TECHNIQUE: Multiplanar, multiecho pulse sequences of the brain and surrounding structures were obtained without and with intravenous contrast. CONTRAST:  46m MULTIHANCE GADOBENATE DIMEGLUMINE 529 MG/ML IV SOLN COMPARISON:  Preoperative MRI of the brain 09/25/2017. FINDINGS: Brain: Left frontal craniotomy is noted. The bulk of the tumor has been resected. There is some residual enhancement along the posterior margin of the surgical cavity as well as the superomedial and inferolateral margins. The deep margin demonstrates no significant enhancement. Posteriorly the area of enhancement extends obliquely 3.2 cm. Blood products are evident within the surgical cavity. Extent of surrounding vasogenic edema is not significantly changed. Mass effect is significantly relieved. There is still some asymmetric impact on the frontal horn of the left lateral ventricle. Sulcal effacement over the convexity  is relieved. Minimal midline shift remains. Mild atrophy and white matter disease is otherwise stable. No distal enhancement is present. Expected enhancement along the dura is noted following surgery. Vascular: Flow is present in the major intracranial arteries. Skull and upper cervical spine: The skull base is within normal limits. Degenerative changes are noted in the upper cervical spine, most evident at C3-4 were central canal stenosis is present. Degenerative endplate changes are present. Marrow signal is otherwise normal. Sinuses/Orbits: The paranasal sinuses and mastoid air cells are clear. Globes and orbits are within normal limits. IMPRESSION: 1. Near complete resection of tumor in the left frontal lobe. 2. Rim of residual enhancement posteriorly along the margin of the resection cavity with some enhancement superior medial and inferolateral. The majority of the surgical cavity does not enhance, suggesting some residual tumor posteriorly as described. 3. Surrounding vasogenic edema is stable with decreased mass effect. 4. Blood products are noted within the resection cavity. 5. Mild atrophy and white matter disease is otherwise stable. No acute infarct is present. Electronically Signed   By: CSan MorelleM.D.   On: 10/02/2017 12:48   Mr BJeri CosWFWContrast  Result Date: 09/25/2017 CLINICAL DATA:  77year old male found down by bed. Possible seizure, incontinent. Combative. EXAM: MRI HEAD WITHOUT AND WITH CONTRAST TECHNIQUE: Multiplanar, multiecho pulse sequences of the brain and surrounding structures were obtained without and with intravenous contrast. CONTRAST:  240mMULTIHANCE GADOBENATE DIMEGLUMINE 529 MG/ML IV SOLN COMPARISON:  Head CT without contrast 0849 hours today. FINDINGS: Brain: Partially hemorrhagic mass involving the medial left superior frontal gyrus, and left cingulate gyrus. The mass encompasses 36 x 28 x 39 millimeters (AP by transverse by CC), is heterogeneously T2  hyperintense, and solidly enhancing. The medial margin of the mass does abut the interhemispheric fissure, but the lesion appears intra-axial on all sequences. There is a small volume of late subacute appearing hemorrhage within the central aspect of the mass (intrinsic T1 hyperintensity on series 14, image 37) with surrounding hemosiderin. There is associated blood product diffusion artifact. There is restricted diffusion along the peripheral margins of the mass suggesting hyper cellularity. There is confluent surrounding T2 and FLAIR hyperintensity in a pattern suggesting vasogenic edema. There is mild regional mass effect, mostly on the  anterior body of the left lateral ventricle. There is trace local rightward midline shift. There is no larger area of restricted diffusion about the mass. No diffusion restriction elsewhere. No ventriculomegaly. No extra-axial or intraventricular blood. Basilar cisterns are normal. No other abnormal intracranial enhancement is identified. No definite dural thickening. No other cerebral edema or mass effect. Outside of the anterior left frontal lobe there is mild for age symmetric bilateral nonspecific periventricular white matter T2 and FLAIR hyperintensity. No cortical encephalomalacia or definite chronic cerebral blood products. There is mild for age T2 heterogeneity in the bilateral deep gray matter nuclei. The brainstem and cerebellum appear normal. Cervicomedullary junction and pituitary are within normal limits. Vascular: Major intracranial vascular flow voids are preserved. Skull and upper cervical spine: Advanced degenerative changes in the visible cervical spine. Visualized bone marrow signal is within normal limits. Negative visible cervical spinal cord aside from mild degenerative stenosis. Sinuses/Orbits: Negative. Other: Visible internal auditory structures appear normal. Scalp and face soft tissues appear negative. IMPRESSION: 1. Solitary 3.9 cm intra-axial,  enhancing, and partially hemorrhagic mass of the medial left superior frontal gyrus affecting the adjacent left cingulate gyrus. Surrounding vasogenic edema versus nonenhancing tumor. Mild regional mass effect. Top differential considerations are solitary metastasis and high-grade glioma. CNS lymphoma is less likely. 2. No superimposed infarct or other abnormal brain enhancement. 3. Mild for age signal changes in the cerebral white matter and deep gray matter compatible with chronic small vessel disease. Electronically Signed   By: Genevie Ann M.D.   On: 09/25/2017 12:39   Ct Abdomen Pelvis W Contrast  Result Date: 09/25/2017 CLINICAL DATA:  Brain mass.  Evaluate for primary neoplasm. EXAM: CT CHEST, ABDOMEN, AND PELVIS WITH CONTRAST TECHNIQUE: Multidetector CT imaging of the chest, abdomen and pelvis was performed following the standard protocol during bolus administration of intravenous contrast. CONTRAST:  19m ISOVUE-300 IOPAMIDOL (ISOVUE-300) INJECTION 61% COMPARISON:  None FINDINGS: CT CHEST FINDINGS Cardiovascular: The heart size appears within normal limits. Aortic atherosclerosis. No pericardial effusion. Mediastinum/Nodes: Normal appearance of the thyroid gland. The trachea appears patent and is midline. Normal appearance of the esophagus. No enlarged mediastinal or hilar lymph nodes. Lungs/Pleura: No pleural effusion. Mild subsegmental atelectasis or scar noted in the lung bases. No suspicious pulmonary nodule or mass identified. Musculoskeletal: No aggressive lytic or sclerotic bone lesions. CT ABDOMEN PELVIS FINDINGS Hepatobiliary: No focal liver abnormality is seen. No gallstones, gallbladder wall thickening, or biliary dilatation. Pancreas: Unremarkable. No pancreatic ductal dilatation or surrounding inflammatory changes. Spleen: Normal in size without focal abnormality. Adrenals/Urinary Tract: Normal appearance of the adrenal glands. The kidneys are both unremarkable. Urinary bladder appears normal.  Stomach/Bowel: Stomach normal. The small bowel loops have a normal course and caliber. The appendix is visualized and appears normal. Unremarkable appearance of the colon. Vascular/Lymphatic: Aortic atherosclerosis. No aneurysm. No abdominal adenopathy. No pelvic or inguinal adenopathy. Reproductive: Prostate gland is unremarkable. Other: No free fluid or fluid collections. No peritoneal nodularity. 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 No aggressive lytic or sclerotic bone lesion. IMPRESSION: 1. No findings identified to indicate site of primary neoplastic process to explain brain mass. 2.  Aortic Atherosclerosis (ICD10-I70.0). Electronically Signed   By: TKerby MoorsM.D.   On: 09/25/2017 14:53    ASSESSMENT & PLAN:   Glioblastoma  Mr. SGladdhistology is consistent with glioblastoma.  Biomarkers, including MGMT, IDH-1 and EGFR are pending and will be followed up on.    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  His tumor/resection site should not be affecting the primary language pathways, and therefore I would expect clinical improvement in language and overall mentation in the coming days.    The right sided motor deficits described earlier seem improved. Still, speech therapy and PT/OT will be very important.  We talked a little bit about his diagnosis and potential next steps, including radiation and chemotherapy.  Because of his current language impairment and lack of family at bedside, the best course of action would be to continue this discussion in detail in my clinic very soon after discharge.    The case will be discussed in multi-disciplinary brain tumor board next week.  Follow ups will be arranged by our office.      Will continue to follow during this admission.  The total time spent in the encounter was 55 minutes and more than 50% was on counseling and review of test results      Ventura Sellers, MD 10/03/2017 8:00 AM

## 2017-10-04 DIAGNOSIS — E875 Hyperkalemia: Secondary | ICD-10-CM | POA: Diagnosis not present

## 2017-10-04 DIAGNOSIS — R279 Unspecified lack of coordination: Secondary | ICD-10-CM | POA: Diagnosis not present

## 2017-10-04 DIAGNOSIS — R739 Hyperglycemia, unspecified: Secondary | ICD-10-CM | POA: Diagnosis not present

## 2017-10-04 DIAGNOSIS — Z91013 Allergy to seafood: Secondary | ICD-10-CM | POA: Diagnosis not present

## 2017-10-04 DIAGNOSIS — Z79899 Other long term (current) drug therapy: Secondary | ICD-10-CM | POA: Diagnosis not present

## 2017-10-04 DIAGNOSIS — R41841 Cognitive communication deficit: Secondary | ICD-10-CM | POA: Diagnosis not present

## 2017-10-04 DIAGNOSIS — Z8249 Family history of ischemic heart disease and other diseases of the circulatory system: Secondary | ICD-10-CM | POA: Diagnosis not present

## 2017-10-04 DIAGNOSIS — C719 Malignant neoplasm of brain, unspecified: Secondary | ICD-10-CM

## 2017-10-04 DIAGNOSIS — Z823 Family history of stroke: Secondary | ICD-10-CM | POA: Diagnosis not present

## 2017-10-04 DIAGNOSIS — Z48810 Encounter for surgical aftercare following surgery on the sense organs: Secondary | ICD-10-CM | POA: Diagnosis not present

## 2017-10-04 DIAGNOSIS — Z91018 Allergy to other foods: Secondary | ICD-10-CM | POA: Diagnosis not present

## 2017-10-04 DIAGNOSIS — C711 Malignant neoplasm of frontal lobe: Secondary | ICD-10-CM | POA: Diagnosis not present

## 2017-10-04 DIAGNOSIS — R569 Unspecified convulsions: Secondary | ICD-10-CM | POA: Diagnosis not present

## 2017-10-04 DIAGNOSIS — M6281 Muscle weakness (generalized): Secondary | ICD-10-CM | POA: Diagnosis not present

## 2017-10-04 DIAGNOSIS — R2689 Other abnormalities of gait and mobility: Secondary | ICD-10-CM | POA: Diagnosis not present

## 2017-10-04 DIAGNOSIS — Z9889 Other specified postprocedural states: Secondary | ICD-10-CM | POA: Diagnosis not present

## 2017-10-04 DIAGNOSIS — Z510 Encounter for antineoplastic radiation therapy: Secondary | ICD-10-CM | POA: Diagnosis not present

## 2017-10-04 DIAGNOSIS — E876 Hypokalemia: Secondary | ICD-10-CM | POA: Diagnosis not present

## 2017-10-04 LAB — GLUCOSE, CAPILLARY
GLUCOSE-CAPILLARY: 109 mg/dL — AB (ref 65–99)
Glucose-Capillary: 122 mg/dL — ABNORMAL HIGH (ref 65–99)
Glucose-Capillary: 203 mg/dL — ABNORMAL HIGH (ref 65–99)

## 2017-10-04 MED ORDER — DEXAMETHASONE 4 MG PO TABS: 4.0000 mg | ORAL_TABLET | Freq: Three times a day (TID) | ORAL | 0 refills | Status: DC

## 2017-10-04 MED ORDER — DEXAMETHASONE 4 MG PO TABS
4.0000 mg | ORAL_TABLET | Freq: Three times a day (TID) | ORAL | Status: DC
  Administered 2017-10-04: 4 mg via ORAL
  Filled 2017-10-04: qty 1

## 2017-10-04 MED ORDER — LEVETIRACETAM 500 MG PO TABS: 500.0000 mg | ORAL_TABLET | Freq: Two times a day (BID) | ORAL | 0 refills | Status: DC

## 2017-10-04 MED ORDER — ACETAMINOPHEN 325 MG PO TABS: 650.0000 mg | ORAL_TABLET | Freq: Four times a day (QID) | ORAL | Status: DC | PRN

## 2017-10-04 NOTE — Progress Notes (Signed)
Subjective: Patient resting comfortably in bed.  Denies complaints. Continuing PT/OT/ST.  No family at bedside.  Objective: Vital signs in last 24 hours: Vitals:   10/03/17 1711 10/03/17 1944 10/04/17 0011 10/04/17 0410  BP: 136/74 129/75 132/78 138/79  Pulse: (!) 57 62 (!) 55 85  Resp:      Temp: 97.9 F (36.6 C) 97.7 F (36.5 C) 97.8 F (36.6 C) 97.6 F (36.4 C)  TempSrc: Oral Oral Oral Oral  SpO2: 93% 93% 96% 100%  Weight:      Height:        Intake/Output from previous day: 03/21 0701 - 03/22 0700 In: 900 [P.O.:900] Out: 2600 [Urine:2600]  Physical Exam:  Awake and alert, oriented to name and [facility] hospital, but not to month or year.  Following commands briskly. Increased speech.  Right hemiparesis, but less right upper extremity drift.  Incision healing nicely; no erythema, swelling, or drainage.  CBC Recent Labs    10/02/17 0400 10/03/17 0510  WBC 12.8* 11.4*  HGB 13.2 13.4  HCT 38.1* 38.6*  PLT 170 166   BMET Recent Labs    10/02/17 0400 10/03/17 0510  NA 135 136  K 4.5 4.5  CL 101 100*  CO2 26 24  GLUCOSE 148* 144*  BUN 20 20  CREATININE 0.73 0.69  CALCIUM 8.3* 8.4*    Assessment/Plan: Doing well from neurosurgical perspective. Have asked nursing staff to assist the patient with a shower, may have OT assist as well.  Will begin gradual Decadron taper, and change to 4 mg by mouth every 8 hours.  Okay for discharge from a neurosurgical perspective. Will need to continue on Decadron 4 mg by mouth every 8 hours; I and Dr. Mickeal Skinner will continue Decadron taper post discharge. Also will need Keppra 500 mg by mouth every 12 hours indefinitely. Needs follow-up with myself and Drs. Vaslow and [place] next week.  May benefit from home health PT and OT.   Hosie Spangle, MD 10/04/2017, 7:42 AM

## 2017-10-04 NOTE — Clinical Social Work Note (Signed)
Clinical Social Work Assessment  Patient Details  Name: Christopher Morris MRN: [id number] Date of Birth: [birth year]  Date of referral:  10/04/17               Reason for consult:  [employer], Discharge Planning, Facility Placement                Permission sought to share information with:  Facility Sport and exercise psychologist, Family Supports Permission granted to share information::  Yes, Verbal Permission Granted  Name::        Agency::     Relationship::  daughter  Contact Information:     Housing/Transportation Living arrangements for the past 2 months:  [place] of Information:  Patient, Adult Children, Other (Comment Required)(sister and brother in Sports coach) Patient Interpreter Needed:  None Criminal Activity/Legal Involvement Pertinent to Current Situation/Hospitalization:  No - Comment as needed Significant Relationships:  Adult Children, Other Family Members, Community Support Lives with:  Adult Children, Relatives Do you feel safe going back to the place where you live?  Yes Need for family participation in patient care:  Yes (Comment)  Care giving concerns:  Pt lives with adult daughter and grandson but post surgery has been unsteady on his feet. Pt and pt family would like for him to get rehab prior to returning home and starting chemo.    Social Worker assessment / plan:  CSW met with pt daughter and pt at bedside, pt sister in law and brother in law were also present during assessment. Pt has a newly diagnosed brain tumor and is on the unit post surgery. 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 Pt lives with his adult daughter and grandson and has supportive family. Pt family expressed concern about pt's unsteadiness and wants CSW to explore SNF. CSW explained that pt may meet therapy goals and be discharged relatively quickly if he progresses well, and can only stay until chemo begins. Pt and pt family express  understanding. Pt daughter also expressed worry about finances. She states that they have begun the process for Medicaid and are awaiting approval. Pt daughter inquired about getting paid to help take care of pt during treatment. CSW explained that we do not assist in that process and it would most likely be done through [place] office. CSW to fax out and present offers to pt and pt daughter.   Employment status:  Full-Time(unable to work due to diagnosis) Forensic scientist:  Medicare, Managed Care PT Recommendations:   [place] / Referral to community resources:  [place]  Patient/Family's Response to care:  Pt amenable to placement prior to chemo. Pt and pt daughter appreciative for CSW visit and support with SNF.   Patient/Family's Understanding of and Emotional Response to Diagnosis, Current Treatment, and Prognosis: Pt daughter and pt state that they understand pt diagnosis, and treatment currently/in the future with chemo. Pt daughter appears noticeably overwhelmed by pt care needs and prognosis as he continues through chemo. Pt appears stoic and emotionally calm during assessment despite current situation. Pt family expresses hope that pt will be able to fight the cancer and continue to live life as long as possible as well as possible.   Emotional Assessment Appearance:  Appears stated age Attitude/Demeanor/Rapport:  Engaged, Gracious Affect (typically observed):  Accepting, Adaptable, Calm, Appropriate Orientation:  Oriented to Self, Oriented to Place, Oriented to Situation Alcohol / Substance use:  Not Applicable Psych involvement (Current and /  or in the community):  No (Comment)  Discharge Needs  Concerns to be addressed:  Adjustment to Illness, Care Coordination Readmission within the last 30 days:  No Current discharge risk:  Physical Impairment, Dependent with Mobility Barriers to Discharge:  Continued Medical Work up   [employer], [place] 10/04/2017, 2:11 PM

## 2017-10-04 NOTE — NC FL2 (Addendum)
[place] LEVEL OF CARE SCREENING TOOL     IDENTIFICATION  Patient Name: Christopher Morris Birthdate: [birth year] Sex: male Admission Date (Current Location): 09/25/2017  [facility] and [place] Number:  Herbalist and Address:  The . [facility], [place] [address] [place],  [address]      Provider Number: [id number]  Attending Physician Name and Address:  Velvet Bathe, MD  Relative Name and Phone Number:       Current Level of Care: Hospital Recommended Level of Care: [place] Prior Approval Number:    Date Approved/Denied:   PASRR Number:   [id number]  Discharge Plan: SNF    Current Diagnoses: Patient Active Problem List   Diagnosis Date Noted  . Seizure ([place]) 09/25/2017  . Glioblastoma ([place]) 09/25/2017  . Hyperglycemia 09/25/2017  . Leukocytosis 09/25/2017  . Hypokalemia 09/25/2017  . BLOOD IN STOOL 07/26/2008  . ERECTILE DYSFUNCTION 07/12/2008    Orientation RESPIRATION BLADDER Height & Weight     Self, Time, Situation, Place  Normal Incontinent, External catheter Weight: 190 lb 14.7 oz (86.6 kg) Height:  6\' 4"  (193 cm)  BEHAVIORAL SYMPTOMS/MOOD NEUROLOGICAL BOWEL NUTRITION STATUS      Continent Diet(see discharge summary)  AMBULATORY STATUS COMMUNICATION OF NEEDS Skin   Limited Assist Verbally Surgical wounds(incision on head with staples)                       Personal Care Assistance Level of Assistance  Bathing, Feeding, Dressing Bathing Assistance: Limited assistance Feeding assistance: Independent Dressing Assistance: Limited assistance     Functional Limitations Info  Sight, Hearing, Speech Sight Info: Adequate Hearing Info: Adequate Speech Info: Adequate    SPECIAL CARE FACTORS FREQUENCY  PT (By licensed PT), OT (By licensed OT)     PT Frequency: 5x week OT Frequency: 5x week            Contractures Contractures Info: Not present    Additional Factors Info  Code  Status, Allergies Code Status Info: Full Code Allergies Info: PORK-DERIVED PRODUCTS, SHELLFISH ALLERGY            Current Medications (10/04/2017):  This is the current hospital active medication list Current Facility-Administered Medications  Medication Dose Route Frequency Provider Last Rate Last Dose  . acetaminophen (TYLENOL) tablet 650 mg  650 mg Oral Q6H PRN [name], NP       Or  . acetaminophen (TYLENOL) suppository 650 mg  650 mg Rectal Q6H PRN [name], NP      . dexamethasone (DECADRON) tablet 4 mg  4 mg Oral Q8H [name], MD      . HYDROcodone-acetaminophen (NORCO/VICODIN) 5-325 MG per tablet 1-2 tablet  1-2 tablet Oral Q4H PRN [name], MD   1 tablet at 09/30/17 2340  . hydrOXYzine (VISTARIL) injection 50 mg  50 mg Intramuscular Q4H PRN [name], MD      . insulin aspart (novoLOG) injection 0-9 Units  0-9 Units Subcutaneous TID WC Velvet Bathe, MD   1 Units at 10/04/17 1003  . levETIRAcetam (KEPPRA) tablet 500 mg  500 mg Oral BID [name], MD   500 mg at 10/04/17 1004  . ondansetron (ZOFRAN) tablet 4 mg  4 mg Oral Q6H PRN [name], NP       Or  . ondansetron [facility]) injection 4 mg  4 mg Intravenous Q6H PRN [name], NP      . senna-docusate (  Senokot-S) tablet 1 tablet  1 tablet Oral QHS PRN [name], NP         Discharge Medications: Please see discharge summary for a list of discharge medications.  Relevant Imaging Results:  Relevant Lab Results:   Additional Information SS# [place]  [place], [place]

## 2017-10-04 NOTE — Progress Notes (Signed)
Occupational Therapy Treatment Patient Details Name: Christopher Morris MRN: [id number] DOB: 03-06-1941 Today's Date: 10/04/2017    History of present illness 77 yo admitted after seizure found to have brain mass glioblastoma multiforme s/p left frontal crani 3/18 with tumor resection. unknown PMHx   OT comments  This 77 yo male admitted with above presents to acute OT making progress towards goals but still could benefit from a short SNF stay for rehab prior to D/C home with dtr (dtr asking about this plan instead of straight home and I agree it would be good). We will continue to follow acutely  Follow Up Recommendations  Supervision/Assistance - 24 hour;SNF     Equipment Recommendations  3 in 1 bedside commode       Precautions / Restrictions Precautions Precautions: Fall Restrictions Weight Bearing Restrictions: No       Mobility Bed Mobility               General bed mobility comments: Pt up in recliner upon my arrival  Transfers Overall transfer level: Needs assistance Equipment used: None Transfers: Sit to/from Stand Sit to Stand: Min guard         General transfer comment: Min A to ambulate without AD    Balance Overall balance assessment: Needs assistance Sitting-balance support: No upper extremity supported Sitting balance-Leahy Scale: Good       Standing balance-Leahy Scale: Fair                             ADL either performed or assessed with clinical judgement   ADL Overall ADL's : Needs assistance/impaired     Grooming: Min guard;Standing;Oral care;Wash/dry face Grooming Details (indicate cue type and reason): sways anterior to posterior in standing             Lower Body Dressing: Min guard;Sit to/from stand   Toilet Transfer: Minimal assistance;Ambulation;Comfort height toilet;Grab bars Toilet Transfer Details (indicate cue type and reason): No AD           General ADL Comments: Pt able to ambulate in room with  min guard assist, but required up to mod A when navigating busier environment. Plans in chart where for pt to go home with dtr, called and spoke to dtr on the phone about how much A her dad would need for safety and prevent falls (needs someone with him anytime he is up on his feet, has a hard time seeing on the right and does not always pay attention to itmes on the right so this a safety issue). Needs to sit to shower for safety.--She verbalized understanding but is also asking about ST rehab for pt until he starts chemo/rehab (which I agree would be benefical)     Vision Baseline Vision/History: Wears glasses Wears Glasses: At all times Additional Comments: Pt able to find and tell me the time on clock to his right. When I placed items on his tray table in front of him he was able to find all items except toothpaste which was in his right inferior field (on 4th time asking him he remembered where it was. He was unable to read a paragraph on a piece of paper (he was jumping lines as he read)          Cognition Arousal/Alertness: Awake/alert Behavior During Therapy: WFL for tasks assessed/performed Overall Cognitive Status: Impaired/Different from baseline Area of Impairment: Orientation;Following commands;Safety/judgement;Problem solving  Orientation Level: Disoriented to;Time(2018)     Following Commands: Follows one step commands inconsistently Safety/Judgement: Decreased awareness of safety;Decreased awareness of deficits   Problem Solving: Requires verbal cues General Comments: Pt had trouble with directonal cues for which way to turn (when I said lets go to the sink on the right (he went to the toilet), after getting off of the toilet I asked him to go to the sink (he started for the door). Asked him to take his right sock off and put it back on then take is left sock off and put it back on (he took both socks of, held them and just looked at me until I told him he  could put them back on)                   Pertinent Vitals/ Pain       Pain Assessment: No/denies pain         Frequency  Min 2X/week        Progress Toward Goals  OT Goals(current goals can now be found in the care plan section)  Progress towards OT goals: Progressing toward goals     Plan Discharge plan needs to be updated;Equipment recommendations need to be updated       AM-PAC PT "6 Clicks" Daily Activity     Outcome Measure   Help from another person eating meals?: None Help from another person taking care of personal grooming?: A Little Help from another person toileting, which includes using toliet, bedpan, or urinal?: A Little Help from another person bathing (including washing, rinsing, drying)?: A Little Help from another person to put on and taking off regular upper body clothing?: A Little Help from another person to put on and taking off regular lower body clothing?: A Little 6 Click Score: 19    End of Session Equipment Utilized During Treatment: Gait belt  OT Visit Diagnosis: Unsteadiness on feet (R26.81);Other symptoms and signs involving cognitive function Symptoms and signs involving cognitive functions: (craniotomy for brain tumor)   Activity Tolerance Patient tolerated treatment well   Patient Left in chair;with chair alarm set;with call bell/phone within reach   Nurse Communication (Pt really needs SNF (I left a message for SW and texted MD))        Time: 0300-9233 OT Time Calculation (min): 36 min  Charges: OT General Charges $OT Visit: 1 Visit OT Treatments $Self Care/Home Management : 23-37 mins  [place], OTR/L [phone] 10/04/2017

## 2017-10-04 NOTE — Clinical Social Work Placement (Signed)
   CLINICAL SOCIAL WORK PLACEMENT  NOTE [place]  Date:  10/04/2017  Patient Details  Name: Christopher Morris MRN: [id number] Date of Birth: 02-05-1941  Clinical Social Work is seeking post-discharge placement for this patient at the [place] level of care (*CSW will initial, date and re-position this form in  chart as items are completed):  Yes   Patient/family provided with [place] Work Department's list of facilities offering this level of care within the geographic area requested by the patient (or if unable, by the patient's family).  Yes   Patient/family informed of their freedom to choose among providers that offer the needed level of care, that participate in Medicare, Medicaid or managed care program needed by the patient, have an available bed and are willing to accept the patient.  Yes   Patient/family informed of [facility]'s ownership interest in [facility] and [facility], as well as of the fact that they are under no obligation to receive care at these facilities.  PASRR submitted to EDS on       PASRR number received on 10/04/17     Existing PASRR number confirmed on       FL2 transmitted to all facilities in geographic area requested by pt/family on 10/04/17     FL2 transmitted to all facilities within larger geographic area on       Patient informed that his/her managed care company has contracts with or will negotiate with certain facilities, including the following:        Yes   Patient/family informed of bed offers received.  Patient chooses bed at [facility]     Physician recommends and patient chooses bed at      Patient to be transferred to [facility] on 10/04/17.  Patient to be transferred to facility by [facility]     Patient family notified on 10/04/17 of transfer.  Name of family member notified:  Christopher Morris, daughter     PHYSICIAN       Additional Comment:     _______________________________________________ [place], [place] 10/04/2017, 6:55 PM

## 2017-10-04 NOTE — Progress Notes (Signed)
Pt IV's removed,report given to [employer].  Pt discharge papers, printed prescriptions, and report given to pt and daughter to be delivered with him to rehab. Pt taken to car via wheelchair with all belongings.

## 2017-10-04 NOTE — Discharge Summary (Signed)
Physician Discharge Summary  Christopher Morris YBO:[id number] DOB: 06/13/1941 DOA: 09/25/2017  PCP: No primary care provider on file.  Admit date: 09/25/2017 Discharge date: 10/04/2017  Time spent: > 35 minutes  Recommendations for Outpatient Follow-up:  1. Monitor blood sugars while on decadron. In the low 100's and was going to d/c on lower dose of decadron as such did not place on oral hypoglycemic agent. 2. Ensure patient follows up with Neurosurgery for decadron taper 3. Pt will need lifelong anti seizure medication   Discharge Diagnoses:  Principal Problem:   Glioblastoma ([place]) Active Problems:   Seizure ([place])   Hyperglycemia   Leukocytosis   Hypokalemia   Discharge Condition: stable  Diet recommendation: carb modified diet.  Filed Weights   09/25/17 0840 09/26/17 0011 09/30/17 2020  Weight: 99.8 kg (220 lb) 93.3 kg (205 lb 11 oz) 86.6 kg (190 lb 14.7 oz)    History of present illness:  77 year old male with no past medical history presented with unsteady gait and difficulty talking for past several weeks and witnessed seizure on the day of presentation on 09/25/2017.  MRI revealed left frontal mass with surrounding edema.  Neurosurgery was consulted.  Started on intravenous Decadron and Keppra.  Patient had surgery on 09/30/2017 by neurosurgery.  Hospital Course:   Newly diagnosed left frontal mass with surrounding edema - Status post surgery on 09/30/2017 by neurosurgery.   - Continue Decadron as per neurosurgery, patient to undergo taper as outpatient. - CT chest, abdomen and pelvis did not show any abnormality  Newly diagnosed seizures probably secondary to above - Continue current treatment plan - Outpatient follow-up with neurology  Procedures:  Please refer to EMR for details had removal of glioblastoma  Consultations:  Neuro-oncology  Neurosurgery  Discharge Exam: Vitals:   10/04/17 0410 10/04/17 0804  BP: 138/79 131/87  Pulse: 85 (!) 59  Resp:   17  Temp: 97.6 F (36.4 C) 98.2 F (36.8 C)  SpO2: 100% 98%    General: Pt in nad, alert and awake Cardiovascular: rrr, no rubs Respiratory: no increased wob, no wheezes  Discharge Instructions   Discharge Instructions    Call MD for:  severe uncontrolled pain   Complete by:  As directed    Call MD for:  temperature >100.4   Complete by:  As directed    Diet - low sodium heart healthy   Complete by:  As directed    Discharge instructions   Complete by:  As directed    Please follow up with your neurosurgeon after hospital discharge. Please call their office and confirm appointment follow up date and time.   Increase activity slowly   Complete by:  As directed      Allergies as of 10/04/2017      Reactions   Pork-derived Products    Patient does not eat pork   Shellfish Allergy    Patient does not eat shellfish      Medication List    STOP taking these medications   aspirin EC 81 MG tablet     TAKE these medications   acetaminophen 325 MG tablet Commonly known as:  TYLENOL Take 2 tablets (650 mg total) by mouth every 6 (six) hours as needed for mild pain (or Fever >/= 101).   dexamethasone 4 MG tablet Commonly known as:  DECADRON Take 1 tablet (4 mg total) by mouth every 8 (eight) hours.   levETIRAcetam 500 MG tablet Commonly known as:  KEPPRA Take 1 tablet (500 mg total)  by mouth 2 (two) times daily.      Allergies  Allergen Reactions  . Pork-Derived Products     Patient does not eat pork  . Shellfish Allergy     Patient does not eat shellfish      The results of significant diagnostics from this hospitalization (including imaging, microbiology, ancillary and laboratory) are listed below for reference.    Significant Diagnostic Studies: Dg Chest 2 View  Result Date: 09/25/2017 CLINICAL DATA:  Seizure EXAM: CHEST - 2 VIEW COMPARISON:  May 03, 2016 FINDINGS: There is no edema or consolidation. The heart size and pulmonary vascularity are  normal. No adenopathy. No pneumothorax. No bone lesions. IMPRESSION: No edema or consolidation. Electronically Signed   By: Lowella Grip III M.D.   On: 09/25/2017 09:09   Ct Head Wo Contrast  Result Date: 09/27/2017 CLINICAL DATA:  First-time seizure. Intracranial mass lesion. Brain lab protocol prior to resection. EXAM: CT HEAD WITHOUT CONTRAST TECHNIQUE: Contiguous axial images were obtained from the base of the skull through the vertex without intravenous contrast. COMPARISON:  CT and MRI 09/25/2017 FINDINGS: Brain: BrainLAB protocol for operative utilization. Redemonstration of a necrotic mass in the medial left frontal vertex region with transverse diameter of 3.5 cm and cephalo caudal extent of 3.5-4 cm. Vasogenic edema in the left frontal lobe as seen previously. Small focus of hemorrhage along the superior margin of the tumor. No evidence of increased bleeding. No second lesion is evident. Mass effect results an left-to-right shift of 2 mm. No hydrocephalus. Vascular: There is atherosclerotic calcification of the major vessels at the base of the brain. Skull: Negative Sinuses/Orbits: Clear/normal Other: None IMPRESSION: Brain[facility] protocol for operative utilization. Necrotic left posterior frontal vertex mass 3.5-4 cm in diameter as seen previously. Edema pattern is the same. Small amount of hemorrhage within the superior brim of the tumor is the same. Mass-effect is the same, with left-to-right shift of 2 mm. Electronically Signed   By: Nelson Chimes M.D.   On: 09/27/2017 09:06   Ct Head Wo Contrast  Result Date: 09/25/2017 CLINICAL DATA:  Seizure and fall out of bed. EXAM: CT HEAD WITHOUT CONTRAST CT CERVICAL SPINE WITHOUT CONTRAST TECHNIQUE: Multidetector CT imaging of the head and cervical spine was performed following the standard protocol without intravenous contrast. Multiplanar CT image reconstructions of the cervical spine were also generated. COMPARISON:  None. FINDINGS: CT HEAD FINDINGS  Brain: There is a possible 3.0 cm mass in the left frontal lobe with prominent surrounding vasogenic edema. Gray-white matter differentiation is preserved. Small amount of hyperdensity along the superior aspect of the mass may reflect hemorrhage. There is slight rightward shift of the anterior falx and effacement of the left lateral ventricle frontal horn. No hydrocephalus or extra-axial collection. Vascular: Atherosclerotic vascular calcification of the carotid siphons. No hyperdense vessel. Skull: Normal. Negative for fracture or focal lesion. Sinuses/Orbits: No acute finding. Other: None. CT CERVICAL SPINE FINDINGS Alignment: Reversal of the normal cervical lordosis. No traumatic malalignment. Skull base and vertebrae: No acute fracture. No primary bone lesion or focal pathologic process. Soft tissues and spinal canal: No prevertebral fluid or swelling. No visible canal hematoma. Disc levels: Moderate to severe degenerative disc disease and facet uncovertebral hypertrophy throughout the cervical spine. Multilevel moderate to severe neuroforaminal stenoses. Upper chest: Negative. Other: None. IMPRESSION: 1. Probable 3.0 cm mass in the left frontal lobe with prominent surrounding vasogenic edema. Small amount of hyperdensity along the superior aspect of the mass likely reflects a hemorrhagic component.  Findings less likely represent hemorrhagic infarct given relative preservation of the gray-white differentiation. Recommend contrast-enhanced MRI of the brain for further evaluation. 2. No acute cervical spine fracture. Moderate to severe degenerative changes throughout the cervical spine. Critical Value/emergent results were called by telephone at the time of interpretation on 09/25/2017 at 9:12 am to Dr. Isla Pence , who verbally acknowledged these results. Electronically Signed   By: Titus Dubin M.D.   On: 09/25/2017 09:17   Ct Chest W Contrast  Result Date: 09/25/2017 CLINICAL DATA:  Brain mass.   Evaluate for primary neoplasm. EXAM: CT CHEST, ABDOMEN, AND PELVIS WITH CONTRAST TECHNIQUE: Multidetector CT imaging of the chest, abdomen and pelvis was performed following the standard protocol during bolus administration of intravenous contrast. CONTRAST:  12mL ISOVUE-300 IOPAMIDOL (ISOVUE-300) INJECTION 61% COMPARISON:  None FINDINGS: CT CHEST FINDINGS Cardiovascular: The heart size appears within normal limits. Aortic atherosclerosis. No pericardial effusion. Mediastinum/Nodes: Normal appearance of the thyroid gland. The trachea appears patent and is midline. Normal appearance of the esophagus. No enlarged mediastinal or hilar lymph nodes. Lungs/Pleura: No pleural effusion. Mild subsegmental atelectasis or scar noted in the lung bases. No suspicious pulmonary nodule or mass identified. Musculoskeletal: No aggressive lytic or sclerotic bone lesions. CT ABDOMEN PELVIS FINDINGS Hepatobiliary: No focal liver abnormality is seen. No gallstones, gallbladder wall thickening, or biliary dilatation. Pancreas: Unremarkable. No pancreatic ductal dilatation or surrounding inflammatory changes. Spleen: Normal in size without focal abnormality. Adrenals/Urinary Tract: Normal appearance of the adrenal glands. The kidneys are both unremarkable. Urinary bladder appears normal. Stomach/Bowel: Stomach normal. The small bowel loops have a normal course and caliber. The appendix is visualized and appears normal. Unremarkable appearance of the colon. Vascular/Lymphatic: Aortic atherosclerosis. No aneurysm. No abdominal adenopathy. No pelvic or inguinal adenopathy. Reproductive: Prostate gland is unremarkable. Other: No free fluid or fluid collections. No peritoneal nodularity. 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 No aggressive lytic or sclerotic bone lesion. IMPRESSION: 1. No findings identified to indicate site of primary neoplastic process to explain brain mass. 2.  Aortic Atherosclerosis  (ICD10-I70.0). Electronically Signed   By: Kerby Moors M.D.   On: 09/25/2017 14:53   Ct Cervical Spine Wo Contrast  Result Date: 09/25/2017 CLINICAL DATA:  Seizure and fall out of bed. EXAM: CT HEAD WITHOUT CONTRAST CT CERVICAL SPINE WITHOUT CONTRAST TECHNIQUE: Multidetector CT imaging of the head and cervical spine was performed following the standard protocol without intravenous contrast. Multiplanar CT image reconstructions of the cervical spine were also generated. COMPARISON:  None. FINDINGS: CT HEAD FINDINGS Brain: There is a possible 3.0 cm mass in the left frontal lobe with prominent surrounding vasogenic edema. Gray-white matter differentiation is preserved. Small amount of hyperdensity along the superior aspect of the mass may reflect hemorrhage. There is slight rightward shift of the anterior falx and effacement of the left lateral ventricle frontal horn. No hydrocephalus or extra-axial collection. Vascular: Atherosclerotic vascular calcification of the carotid siphons. No hyperdense vessel. Skull: Normal. Negative for fracture or focal lesion. Sinuses/Orbits: No acute finding. Other: None. CT CERVICAL SPINE FINDINGS Alignment: Reversal of the normal cervical lordosis. No traumatic malalignment. Skull base and vertebrae: No acute fracture. No primary bone lesion or focal pathologic process. Soft tissues and spinal canal: No prevertebral fluid or swelling. No visible canal hematoma. Disc levels: Moderate to severe degenerative disc disease and facet uncovertebral hypertrophy throughout the cervical spine. Multilevel moderate to severe neuroforaminal stenoses. Upper chest: Negative. Other: None. IMPRESSION: 1. Probable 3.0 cm mass in the  left frontal lobe with prominent surrounding vasogenic edema. Small amount of hyperdensity along the superior aspect of the mass likely reflects a hemorrhagic component. Findings less likely represent hemorrhagic infarct given relative preservation of the gray-white  differentiation. Recommend contrast-enhanced MRI of the brain for further evaluation. 2. No acute cervical spine fracture. Moderate to severe degenerative changes throughout the cervical spine. Critical Value/emergent results were called by telephone at the time of interpretation on 09/25/2017 at 9:12 am to Dr. Isla Pence , who verbally acknowledged these results. Electronically Signed   By: Titus Dubin M.D.   On: 09/25/2017 09:17   [name] NI Contrast  Result Date: 10/02/2017 CLINICAL DATA:  Tumor resection 09/30/2016. EXAM: MRI HEAD WITHOUT AND WITH CONTRAST TECHNIQUE: Multiplanar, multiecho pulse sequences of the brain and surrounding structures were obtained without and with intravenous contrast. CONTRAST:  62mL MULTIHANCE GADOBENATE DIMEGLUMINE 529 MG/ML IV SOLN COMPARISON:  Preoperative MRI of the brain 09/25/2017. FINDINGS: Brain: Left frontal craniotomy is noted. The bulk of the tumor has been resected. There is some residual enhancement along the posterior margin of the surgical cavity as well as the superomedial and inferolateral margins. The deep margin demonstrates no significant enhancement. Posteriorly the area of enhancement extends obliquely 3.2 cm. Blood products are evident within the surgical cavity. Extent of surrounding vasogenic edema is not significantly changed. Mass effect is significantly relieved. There is still some asymmetric impact on the frontal horn of the left lateral ventricle. Sulcal effacement over the convexity is relieved. Minimal midline shift remains. Mild atrophy and white matter disease is otherwise stable. No distal enhancement is present. Expected enhancement along the dura is noted following surgery. Vascular: Flow is present in the major intracranial arteries. Skull and upper cervical spine: The skull base is within normal limits. Degenerative changes are noted in the upper cervical spine, most evident at C3-4 were central canal stenosis is present.  Degenerative endplate changes are present. Marrow signal is otherwise normal. Sinuses/Orbits: The paranasal sinuses and mastoid air cells are clear. Globes and orbits are within normal limits. IMPRESSION: 1. Near complete resection of tumor in the left frontal lobe. 2. Rim of residual enhancement posteriorly along the margin of the resection cavity with some enhancement superior medial and inferolateral. The majority of the surgical cavity does not enhance, suggesting some residual tumor posteriorly as described. 3. Surrounding vasogenic edema is stable with decreased mass effect. 4. Blood products are noted within the resection cavity. 5. Mild atrophy and white matter disease is otherwise stable. No acute infarct is present. Electronically Signed   By: San Morelle M.D.   On: 10/02/2017 12:48   [name] OE Contrast  Result Date: 09/25/2017 CLINICAL DATA:  77 year old male found down by bed. Possible seizure, incontinent. Combative. EXAM: MRI HEAD WITHOUT AND WITH CONTRAST TECHNIQUE: Multiplanar, multiecho pulse sequences of the brain and surrounding structures were obtained without and with intravenous contrast. CONTRAST:  44mL MULTIHANCE GADOBENATE DIMEGLUMINE 529 MG/ML IV SOLN COMPARISON:  Head CT without contrast 0849 hours today. FINDINGS: Brain: Partially hemorrhagic mass involving the medial left superior frontal gyrus, and left cingulate gyrus. The mass encompasses 36 x 28 x 39 millimeters (AP by transverse by CC), is heterogeneously T2 hyperintense, and solidly enhancing. The medial margin of the mass does abut the interhemispheric fissure, but the lesion appears intra-axial on all sequences. There is a small volume of late subacute appearing hemorrhage within the central aspect of the mass (intrinsic T1 hyperintensity on series 14, image 37) with  surrounding hemosiderin. There is associated blood product diffusion artifact. There is restricted diffusion along the peripheral margins of the  mass suggesting hyper cellularity. There is confluent surrounding T2 and FLAIR hyperintensity in a pattern suggesting vasogenic edema. There is mild regional mass effect, mostly on the anterior body of the left lateral ventricle. There is trace local rightward midline shift. There is no larger area of restricted diffusion about the mass. No diffusion restriction elsewhere. No ventriculomegaly. No extra-axial or intraventricular blood. Basilar cisterns are normal. No other abnormal intracranial enhancement is identified. No definite dural thickening. No other cerebral edema or mass effect. Outside of the anterior left frontal lobe there is mild for age symmetric bilateral nonspecific periventricular white matter T2 and FLAIR hyperintensity. No cortical encephalomalacia or definite chronic cerebral blood products. There is mild for age T2 heterogeneity in the bilateral deep gray matter nuclei. The brainstem and cerebellum appear normal. Cervicomedullary junction and pituitary are within normal limits. Vascular: Major intracranial vascular flow voids are preserved. Skull and upper cervical spine: Advanced degenerative changes in the visible cervical spine. Visualized bone marrow signal is within normal limits. Negative visible cervical spinal cord aside from mild degenerative stenosis. Sinuses/Orbits: Negative. Other: Visible internal auditory structures appear normal. Scalp and face soft tissues appear negative. IMPRESSION: 1. Solitary 3.9 cm intra-axial, enhancing, and partially hemorrhagic mass of the medial left superior frontal gyrus affecting the adjacent left cingulate gyrus. Surrounding vasogenic edema versus nonenhancing tumor. Mild regional mass effect. Top differential considerations are solitary metastasis and high-grade glioma. CNS lymphoma is less likely. 2. No superimposed infarct or other abnormal brain enhancement. 3. Mild for age signal changes in the cerebral white matter and deep gray matter  compatible with chronic small vessel disease. Electronically Signed   By: Genevie Ann M.D.   On: 09/25/2017 12:39   Ct Abdomen Pelvis W Contrast  Result Date: 09/25/2017 CLINICAL DATA:  Brain mass.  Evaluate for primary neoplasm. EXAM: CT CHEST, ABDOMEN, AND PELVIS WITH CONTRAST TECHNIQUE: Multidetector CT imaging of the chest, abdomen and pelvis was performed following the standard protocol during bolus administration of intravenous contrast. CONTRAST:  127mL ISOVUE-300 IOPAMIDOL (ISOVUE-300) INJECTION 61% COMPARISON:  None FINDINGS: CT CHEST FINDINGS Cardiovascular: The heart size appears within normal limits. Aortic atherosclerosis. No pericardial effusion. Mediastinum/Nodes: Normal appearance of the thyroid gland. The trachea appears patent and is midline. Normal appearance of the esophagus. No enlarged mediastinal or hilar lymph nodes. Lungs/Pleura: No pleural effusion. Mild subsegmental atelectasis or scar noted in the lung bases. No suspicious pulmonary nodule or mass identified. Musculoskeletal: No aggressive lytic or sclerotic bone lesions. CT ABDOMEN PELVIS FINDINGS Hepatobiliary: No focal liver abnormality is seen. No gallstones, gallbladder wall thickening, or biliary dilatation. Pancreas: Unremarkable. No pancreatic ductal dilatation or surrounding inflammatory changes. Spleen: Normal in size without focal abnormality. Adrenals/Urinary Tract: Normal appearance of the adrenal glands. The kidneys are both unremarkable. Urinary bladder appears normal. Stomach/Bowel: Stomach normal. The small bowel loops have a normal course and caliber. The appendix is visualized and appears normal. Unremarkable appearance of the colon. Vascular/Lymphatic: Aortic atherosclerosis. No aneurysm. No abdominal adenopathy. No pelvic or inguinal adenopathy. Reproductive: Prostate gland is unremarkable. Other: No free fluid or fluid collections. No peritoneal nodularity. Musculoskeletal: Degenerative disc disease identified within  the lumbar spine. No aggressive lytic or sclerotic bone lesion. IMPRESSION: 1. No findings identified to indicate site of primary neoplastic process to explain brain mass. 2.  Aortic Atherosclerosis (ICD10-I70.0). Electronically Signed   By: Queen Slough.D.  On: 09/25/2017 14:53    Microbiology: Recent Results (from the past 240 hour(s))  MRSA PCR Screening     Status: None   Collection Time: 09/27/17  4:47 PM  Result Value Ref Range Status   MRSA by PCR NEGATIVE NEGATIVE Final    Comment:        The GeneXpert MRSA Assay (FDA approved for NASAL specimens only), is one component of a comprehensive MRSA colonization surveillance program. It is not intended to diagnose MRSA infection nor to guide or monitor treatment for MRSA infections. Performed at [facility], [place] [address], [place], [place] [address]   MRSA PCR Screening     Status: None   Collection Time: 09/30/17  8:20 PM  Result Value Ref Range Status   MRSA by PCR NEGATIVE NEGATIVE Final    Comment:        The GeneXpert MRSA Assay (FDA approved for NASAL specimens only), is one component of a comprehensive MRSA colonization surveillance program. It is not intended to diagnose MRSA infection nor to guide or monitor treatment for MRSA infections. Performed at [facility], [place] [address], [place], [place] [address]      Labs: Basic Metabolic Panel: Recent Labs  Lab 09/28/17 0352 09/29/17 0629 09/30/17 0919 10/02/17 0400 10/03/17 0510  NA 139 139 137 135 136  K 3.9 4.6 4.9 4.5 4.5  CL 105 105 102 101 100*  CO2 25 27 27 26 24   GLUCOSE 141* 137* 147* 148* 144*  BUN 19 18 17 20 20   CREATININE 0.84 0.87 0.87 0.73 0.69  CALCIUM 8.8* 8.7* 8.6* 8.3* 8.4*  MG  --  2.2 2.1 2.3 2.3   Liver Function Tests: No results for input(s): AST, ALT, ALKPHOS, BILITOT, PROT, ALBUMIN in the last 168 hours. No results for input(s): LIPASE, AMYLASE in the last 168 hours. No results for input(s): AMMONIA in the  last 168 hours. CBC: Recent Labs  Lab 09/28/17 0352 09/29/17 0629 09/30/17 0919 10/02/17 0400 10/03/17 0510  WBC 11.9* 11.1* 10.8* 12.8* 11.4*  NEUTROABS  --  9.9* 9.6* 11.0* 9.8*  HGB 12.0* 12.7* 13.6 13.2 13.4  HCT 35.5* 36.8* 39.3 38.1* 38.6*  MCV 97.3 97.1 97.0 96.7 97.0  PLT 193 192 205 170 166   Cardiac Enzymes: No results for input(s): CKTOTAL, CKMB, CKMBINDEX, TROPONINI in the last 168 hours. BNP: BNP (last 3 results) No results for input(s): BNP in the last 8760 hours.  ProBNP (last 3 results) No results for input(s): PROBNP in the last 8760 hours.  CBG: Recent Labs  Lab 10/03/17 0828 10/03/17 1236 10/03/17 1712 10/03/17 2128 10/04/17 0830  GLUCAP 142* 115* 129* 130* 122*    Signed:  Velvet Bathe MD.  [facility] 10/04/2017, 10:58 AM

## 2017-10-04 NOTE — Social Work (Addendum)
CSW presented family and pt with offers, they are able to transport pt themselves.  Pt family has chosen [facility]. [facility] is able to take pt today, sent discharge information through hub.   4:15pm- Clinical Social Worker facilitated patient discharge including contacting patient family and facility to confirm patient discharge plans.  Clinical information faxed to facility and family agreeable with plan.  CSW arranged ambulance transport via [facility] to [place].  RN to call [phone] with report  prior to discharge.  Clinical Social Worker will sign off for now as social work intervention is no longer needed. Please consult [place] again if new need arises.  Alexander Mt, [place] Social Worker

## 2017-10-04 NOTE — Care Management Important Message (Signed)
Important Message  Patient Details  Name: Christopher Morris MRN: [id number] Date of Birth: Feb 18, 1941   Medicare Important Message Given:  Yes    Tryson Lumley Montine Circle 10/04/2017, 2:01 PM

## 2017-10-07 ENCOUNTER — Encounter: Payer: Self-pay | Admitting: Internal Medicine

## 2017-10-07 ENCOUNTER — Non-Acute Institutional Stay: Payer: BLUE CROSS/BLUE SHIELD | Admitting: Internal Medicine

## 2017-10-07 DIAGNOSIS — R569 Unspecified convulsions: Secondary | ICD-10-CM | POA: Diagnosis not present

## 2017-10-07 DIAGNOSIS — C719 Malignant neoplasm of brain, unspecified: Secondary | ICD-10-CM

## 2017-10-07 DIAGNOSIS — R739 Hyperglycemia, unspecified: Secondary | ICD-10-CM

## 2017-10-07 NOTE — Progress Notes (Signed)
: Provider:  Noah Delaine. Sheppard Coil, MD Location:  [place] Room Number: [id number] Place of Service:  SNF (31)  PCP: No primary care provider on file. No care team member to display  Extended Emergency Contact Information Primary Emergency Contact: [place] Phone: [phone] Mobile Phone: [phone] Relation: Daughter     Allergies: Pork-derived products and Shellfish allergy  Chief Complaint  Patient presents with  . New Admit To SNF    Admit to Facility    HPI: Patient is 77 y.o. male with unknown past medical history, followed by the [place], no records available who presented to [facility] emergency department with a seizure.  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  It was noted that he had had an unsteady gait and difficulty talking for the past several weeks prior to his seizure.  MRI revealed a left frontal mass with surrounding edema.  Patient was admitted to [facility] from 3/13-22 where his tumor was removed per neurosurgery on 3/18.  Patient was placed on Decadron taste on Keppra for seizures.  Patient is admitted to skilled nursing facility for OT/PT.  History reviewed. No pertinent past medical history.  Past Surgical History:  Procedure Laterality Date  . APPLICATION OF CRANIAL NAVIGATION N/A 09/30/2017   Procedure: APPLICATION OF CRANIAL NAVIGATION;  Surgeon: [name], MD;  Location: [place];  Service: Neurosurgery;  Laterality: N/A;  . CRANIOTOMY N/A 09/30/2017   Procedure: CRANIOTOMY FOR RESECTION OF BRAIN TUMOR WITH STEREOTACTIC;  Surgeon: [name], MD;  Location: [place];  Service: Neurosurgery;  Laterality: N/A;  CRANIOTOMY FOR RESECTION OF BRAIN TUMOR WITH STEREOTACTIC    Allergies as of 10/07/2017      Reactions   Pork-derived Products    Patient does not eat pork   Shellfish Allergy    Patient does not eat  shellfish      Medication List        Accurate as of 10/07/17 11:59 PM. Always use your most recent med list.          acetaminophen 325 MG tablet Commonly known as:  TYLENOL Take 2 tablets (650 mg total) by mouth every 6 (six) hours as needed for mild pain (or Fever >/= 101).   dexamethasone 4 MG tablet Commonly known as:  DECADRON Take 1 tablet (4 mg total) by mouth every 8 (eight) hours.   levETIRAcetam 500 MG tablet Commonly known as:  KEPPRA Take 1 tablet (500 mg total) by mouth 2 (two) times daily.       No orders of the defined types were placed in this encounter.   Immunization History  Administered Date(s) Administered  . Pneumococcal Polysaccharide-23 04/27/2004  . Td 07/12/2008    Social History   Tobacco Use  . Smoking status: Never Smoker  . Smokeless tobacco: Never Used  Substance Use Topics  . Alcohol use: No    Frequency: Never    Family history is   Family History  Problem Relation Age of Onset  . Hypertension Daughter   . Stroke Mother   . Cancer Neg Hx       Review of Systems  DATA OBTAINED: from patient, nurse GENERAL:  no fevers, fatigue, appetite changes SKIN: No itching, or rash EYES: No eye pain, redness, discharge EARS: No earache, tinnitus, change in hearing NOSE: No congestion, drainage or bleeding  MOUTH/THROAT: No  mouth or tooth pain, No sore throat RESPIRATORY: No cough, wheezing, SOB CARDIAC: No chest pain, palpitations, lower extremity edema  GI: No abdominal pain, No N/V/D or constipation, No heartburn or reflux  GU: No dysuria, frequency or urgency, or incontinence  MUSCULOSKELETAL: No unrelieved bone/joint pain NEUROLOGIC: No headache, dizziness or focal weakness PSYCHIATRIC: No c/o anxiety or sadness   Vitals:   10/07/17 0933  BP: 113/76  Pulse: 62  Resp: 18  Temp: 97.6 F (36.4 C)  SpO2: 96%    SpO2 Readings from Last 1 Encounters:  10/07/17 96%   Body mass index is 23.24 kg/m.     Physical  Exam  GENERAL APPEARANCE: Alert, conversant,  No acute distress.  SKIN: Is in place and scalp, looks clean HEAD: Normocephalic, atraumatic  EYES: Conjunctiva/lids clear. Pupils round, reactive. EOMs intact.  EARS: External exam WNL, canals clear. Hearing grossly normal.  NOSE: No deformity or discharge.  MOUTH/THROAT: Lips w/o lesions  RESPIRATORY: Breathing is even, unlabored. Lung sounds are clear   CARDIOVASCULAR: Heart RRR no murmurs, rubs or gallops. No peripheral edema.   GASTROINTESTINAL: Abdomen is soft, non-tender, not distended w/ normal bowel sounds. GENITOURINARY: Bladder non tender, not distended  MUSCULOSKELETAL: No abnormal joints or musculature NEUROLOGIC:  Cranial nerves 2-12 grossly intact, patient responses are slow propria at; moves all extremities  PSYCHIATRIC: Mood and affect appropriate to situation, no behavioral issues  Patient Active Problem List   Diagnosis Date Noted  . Seizure ([place]) 09/25/2017  . Glioblastoma ([place]) 09/25/2017  . Hyperglycemia 09/25/2017  . Leukocytosis 09/25/2017  . Hypokalemia 09/25/2017  . BLOOD IN STOOL 07/26/2008  . ERECTILE DYSFUNCTION 07/12/2008      Labs reviewed: Basic Metabolic Panel:    Component Value Date/Time   NA 136 10/03/2017 0510   K 4.5 10/03/2017 0510   CL 100 (L) 10/03/2017 0510   CO2 24 10/03/2017 0510   GLUCOSE 144 (H) 10/03/2017 0510   BUN 20 10/03/2017 0510   CREATININE 0.69 10/03/2017 0510   CALCIUM 8.4 (L) 10/03/2017 0510   PROT 6.5 09/25/2017 0834   ALBUMIN 3.9 09/25/2017 0834   AST 24 09/25/2017 0834   ALT 14 (L) 09/25/2017 0834   ALKPHOS 67 09/25/2017 0834   BILITOT 1.2 09/25/2017 0834   GFRNONAA >60 10/03/2017 0510   GFRAA >60 10/03/2017 0510    Recent Labs    09/30/17 0919 10/02/17 0400 10/03/17 0510  NA 137 135 136  K 4.9 4.5 4.5  CL 102 101 100*  CO2 27 26 24   GLUCOSE 147* 148* 144*  BUN 17 20 20   CREATININE 0.87 0.73 0.69  CALCIUM 8.6* 8.3* 8.4*  MG 2.1 2.3 2.3   Liver  Function Tests: Recent Labs    09/25/17 0834  AST 24  ALT 14*  ALKPHOS 67  BILITOT 1.2  PROT 6.5  ALBUMIN 3.9   No results for input(s): LIPASE, AMYLASE in the last 8760 hours. No results for input(s): AMMONIA in the last 8760 hours. CBC: Recent Labs    09/30/17 0919 10/02/17 0400 10/03/17 0510  WBC 10.8* 12.8* 11.4*  NEUTROABS 9.6* 11.0* 9.8*  HGB 13.6 13.2 13.4  HCT 39.3 38.1* 38.6*  MCV 97.0 96.7 97.0  PLT 205 170 166   Lipid No results for input(s): CHOL, HDL, LDLCALC, TRIG in the last 8760 hours.  Cardiac Enzymes: No results for input(s): CKTOTAL, CKMB, CKMBINDEX, TROPONINI in the last 8760 hours. BNP: No results for input(s): BNP in the last 8760 hours. No results found  for: [facility] Lab Results  Component Value Date   HGBA1C 5.6 09/25/2017   Lab Results  Component Value Date   TSH 3.95 07/14/2008   No results found for: VITAMINB12 No results found for: FOLATE No results found for: IRON, TIBC, FERRITIN  Imaging and Procedures obtained prior to SNF admission: No results found.   Not all labs, radiology exams or other studies done during hospitalization come through on my EPIC note; however they are reviewed by me.    Assessment and Plan  Glioblastoma multiforme left frontal lobe-resected on 3/18; patient started on Decadron and Keppra, to be followed outpatient; CT chest, abdomen, pelvis did not show any abnormality SNF -continue Decadron 4 mg every 8 hours; follow-up with surgery for Decadron taper  New onset seizures-secondary to mass-started on Keppra SNF -continue Keppra 500 mg twice daily  Hyperglycemia-secondary to Decadron SNF -we will follow blood sugars   Time spent greater than 45 minutes;> 50% of time with patient was spent reviewing records, labs, tests and studies, counseling and developing plan of care  Webb Silversmith D. Sheppard Coil, MD

## 2017-10-12 ENCOUNTER — Encounter: Payer: Self-pay | Admitting: Internal Medicine

## 2017-10-16 NOTE — Progress Notes (Signed)
Location/Histology of Brain Tumor: Glioblastoma of the left frontal lobe.  Patient presented with symptoms of:  New onset seizures 09/25/2017.  Experienced loss of consciousness and urinary incontinence followed with post ictal right sided weakness.  Initial symptoms was unsteady gait and difficulty speaking for several weeks prior to seizure.  CT head: possible 3 cm mass in the left frontal lobe with prominent vasogenic edema.  MRI 09/25/2017: revealed the lesion and measured it at 36 x 28 x 39 mm and a small volume of late subacute hemorrhage within the central aspect of the mass.  Trace local rightward shift was seen.  OR 09/30/2017: Craniotomy and resection of glioblastoma.  Post surgical MRI 10/02/2017: revealed some residual enhancement along the superior medial and inferolateral aspect of the cavity.  The majority of the surgical cavity does not enhance.  Past or anticipated interventions, if any, per neurosurgery:  Craniotomy and resection of glioblastoma by Dr. Sherwood Gambler 09/30/2017.  Past or anticipated interventions, if any, per medical oncology:  Dr. Mickeal Skinner 10/03/2017 Potential start of chemotherapy and radiation New appointment with Dr. Mickeal Skinner scheduled for 10/17/2017 12:20pm  Past or anticipated interventions, if any, per radiation oncology: PA Perkins 10/03/2017 1. Good candidate for adjuvant therapy, options for radiotherapy in conjunction with temozolomide. 2. Anticonvulsant therapy: continue keppra, no driving until 6 months seizure free. CT Simulation scheduled 10/17/2017 11 am New appointment scheduled on 10/17/2017 10 am  Dose of Decadron, if applicable: 4 mg PO [id number]  Recent neurologic symptoms, if any:   Seizures: No  Headaches: No  Nausea: No  Dizziness/ataxia: No  Difficulty with hand coordination: No  Focal numbness/weakness: Right sided weakness, improving.  Visual deficits/changes: No  Confusion/Memory deficits: improving  Painful bone metastases at  present, if any:   SAFETY ISSUES:  Prior radiation? No  Pacemaker/ICD? No  Possible current pregnancy? No  Is the patient on methotrexate? No  Additional Complaints / other details: Speech is much improved per daughter.

## 2017-10-17 ENCOUNTER — Ambulatory Visit
Admission: RE | Admit: 2017-10-17 | Discharge: 2017-10-17 | Disposition: A | Discharge status: Home / Self Care | Payer: BLUE CROSS/BLUE SHIELD | Source: Ambulatory Visit | Attending: Radiation Oncology | Admitting: Radiation Oncology

## 2017-10-17 ENCOUNTER — Telehealth: Payer: Self-pay | Admitting: Internal Medicine

## 2017-10-17 ENCOUNTER — Inpatient Hospital Stay: Payer: BLUE CROSS/BLUE SHIELD | Attending: Internal Medicine | Admitting: Internal Medicine

## 2017-10-17 ENCOUNTER — Encounter: Payer: Self-pay | Admitting: Radiation Oncology

## 2017-10-17 ENCOUNTER — Non-Acute Institutional Stay: Payer: BLUE CROSS/BLUE SHIELD | Admitting: Internal Medicine

## 2017-10-17 ENCOUNTER — Encounter: Payer: Self-pay | Admitting: Internal Medicine

## 2017-10-17 ENCOUNTER — Other Ambulatory Visit: Payer: Self-pay

## 2017-10-17 VITALS — BP 129/71 | HR 70 | Temp 97.9°F | Resp 17 | Ht 76.0 in | Wt 188.2 lb

## 2017-10-17 VITALS — BP 144/63 | HR 66 | Temp 97.7°F | Resp 18 | Ht 76.0 in | Wt 188.6 lb

## 2017-10-17 DIAGNOSIS — R569 Unspecified convulsions: Secondary | ICD-10-CM | POA: Insufficient documentation

## 2017-10-17 DIAGNOSIS — Z91013 Allergy to seafood: Secondary | ICD-10-CM | POA: Insufficient documentation

## 2017-10-17 DIAGNOSIS — Z9889 Other specified postprocedural states: Secondary | ICD-10-CM | POA: Diagnosis not present

## 2017-10-17 DIAGNOSIS — Z91018 Allergy to other foods: Secondary | ICD-10-CM | POA: Diagnosis not present

## 2017-10-17 DIAGNOSIS — E875 Hyperkalemia: Secondary | ICD-10-CM

## 2017-10-17 DIAGNOSIS — C719 Malignant neoplasm of brain, unspecified: Secondary | ICD-10-CM

## 2017-10-17 DIAGNOSIS — C711 Malignant neoplasm of frontal lobe: Secondary | ICD-10-CM

## 2017-10-17 DIAGNOSIS — Z823 Family history of stroke: Secondary | ICD-10-CM | POA: Insufficient documentation

## 2017-10-17 DIAGNOSIS — Z79899 Other long term (current) drug therapy: Secondary | ICD-10-CM | POA: Diagnosis not present

## 2017-10-17 DIAGNOSIS — Z8249 Family history of ischemic heart disease and other diseases of the circulatory system: Secondary | ICD-10-CM | POA: Diagnosis not present

## 2017-10-17 DIAGNOSIS — Z510 Encounter for antineoplastic radiation therapy: Secondary | ICD-10-CM | POA: Diagnosis not present

## 2017-10-17 MED ORDER — TEMOZOLOMIDE 140 MG PO CAPS: 140.0000 mg | ORAL_CAPSULE | Freq: Every day | ORAL | 0 refills | Status: DC

## 2017-10-17 MED ORDER — DEXAMETHASONE 4 MG PO TABS: 4.0000 mg | ORAL_TABLET | Freq: Two times a day (BID) | ORAL | 2 refills | Status: DC

## 2017-10-17 MED ORDER — ONDANSETRON HCL 8 MG PO TABS: 8.0000 mg | ORAL_TABLET | Freq: Two times a day (BID) | ORAL | 1 refills | Status: DC | PRN

## 2017-10-17 MED ORDER — TEMOZOLOMIDE 20 MG PO CAPS: 20.0000 mg | ORAL_CAPSULE | Freq: Every day | ORAL | 0 refills | Status: DC

## 2017-10-17 MED ORDER — LEVETIRACETAM 500 MG PO TABS: 500.0000 mg | ORAL_TABLET | Freq: Two times a day (BID) | ORAL | 0 refills | Status: DC

## 2017-10-17 NOTE — Progress Notes (Signed)
This is an acute visit.  Level of care skilled.  [facility]  Chief complaint-acute visit secondary to possible hyperkalemia.  History of present illness.  Is a very pleasant 77 year old male who is here for rehab after tumor removal.  Who presented initially to the ER with a seizure.  Apparently had an unsteady gait and difficulty talking for several weeks prior to the seizure MRI revealed a left frontal mass with surrounding edema.  His tumor was removed per neurosurgery on March 18 and he was placed on Decadron he is also on Keppra for seizures.  He actually saw oncology today and they are titrating down his Decadron-he continues on Keppra.  He was thought to be doing well and has gained strength on his weekend right side   Labs were done on March 27 which appear to be fairly baseline however it did show an elevated potassium of 5.7-unclear however if this was hemolysis related-she is not on an ACE inhibitor or potassium supplementation.  Clinically he appears to be stable he is actually eating supper this evening --- he has no complaints appears to be in good spirits-   Allergies:       Allergies  Allergen Reactions  . Pork-Derived Products     Patient does not eat pork  . Shellfish Allergy     Patient does not eat shellfish   Past Medical History: History reviewed. No pertinent past medical history.--- Past Surgical History:       Past Surgical History:  Procedure Laterality Date  . APPLICATION OF CRANIAL NAVIGATION N/A 09/30/2017   Procedure: APPLICATION OF CRANIAL NAVIGATION;  Surgeon: [name], MD;  Location: [place];  Service: Neurosurgery;  Laterality: N/A;  . CRANIOTOMY N/A 09/30/2017   Procedure: CRANIOTOMY FOR RESECTION OF BRAIN TUMOR WITH STEREOTACTIC;  Surgeon: [name], MD;  Location: [place];  Service: Neurosurgery;  Laterality: N/A;  CRANIOTOMY FOR RESECTION OF BRAIN TUMOR WITH STEREOTACTIC   Social History:  Social  History        Socioeconomic History  . Marital status: Divorced    Spouse name: Not on file  . Number of children: Not on file  . Years of education: Not on file  . Highest education level: Not on file  Occupational History  . Not on file  Social Needs  . Financial resource strain: Not on file  . Food insecurity:    Worry: Not on file    Inability: Not on file  . Transportation needs:    Medical: No    Non-medical: No  Tobacco Use  . Smoking status: Never Smoker  . Smokeless tobacco: Never Used  Substance and Sexual Activity  . Alcohol use: No    Frequency: Never  . Drug use: No  . Sexual activity: Not Currently  Lifestyle  . Physical activity:    Days per week: 5 days    Minutes per session: Not on file  . Stress: Only a little  Relationships  . Social connections:    Talks on phone: Not on file    Gets together: Not on file    Attends religious service: Not on file    Active member of club or organization: Not on file    Attends meetings of clubs or organizations: Not on file    Relationship status: Not on file  . Intimate partner violence:    Fear of current or ex partner: Not on file    Emotionally abused: Not on file  Physically abused: Not on file    Forced sexual activity: Not on file  Other Topics Concern  . Not on file  Social History Narrative  . Not on file   Family History:       Family History  Problem Relation Age of Onset  . Hypertension Daughter   . Stroke Mother   . Cancer Neg Hx      Medication List          MEDICATIONS           acetaminophen 325 MG tablet Commonly known as:  TYLENOL Take 2 tablets (650 mg total) by mouth every 6 (six) hours as needed for mild pain (or Fever >/= 101).   dexamethasone 4 MG tablet Commonly known as:  DECADRON Take 1 tablet (4 mg total) by mouth every 8 (eight) hours.   levETIRAcetam 500 MG tablet Commonly known as:  KEPPRA Take 1 tablet  (500 mg total) by mouth 2 (two) times daily.              Review of systems.  In general is not complaining of any fever chills.  Skin does not complain of rashes or itching.  Head ears eyes nose mouth and throat is not really complain of visual changes or sore throat.  Respiratory does not complain of shortness of breath or cough.  Cardiac is not complaining of chest pain does not appear to have significant lower extremity edema.  GI does not complain of abdominal discomfort he is eating supper and appears to be enjoying it.  Musculoskeletal has had some history of right sided weakness but this appears to be improving neurology noted this as well he does not complain of joint pain.  Neurologic does have a history of brain tumor followed by oncology is not really complaining of headache dizziness at this point I do not know any recent seizures.  Psych appears to be in good spirits does not complain of being anxious or depressed   Physical exam.  He is afebrile pulse of 76 respirations of 18 blood pressure 112/68.  In general this is a very pleasant elderly male in no distress he looks somewhat younger than his stated age.  His skin is warm and dry he does have a surgical scar on the top of his head-this appears to be benign appearing without signs of infection.  Eyes sclera and conjunctive are clear visual acuity appears intact.  Oropharynx is clear mucous membranes moist.  Chest is clear to auscultation there is no labored breathing.  Heart is regular rate and rhythm without murmur gallop or rub he does not have significant lower extremity edema.  Abdomen is soft nontender with positive bowel sounds.  Musculoskeletal does move all extremities x4 does have fairly minimal right-sided weakness compared to the left.  Neurologic--does have some mild right-sided weakness compared to left cranial nerves appear to be grossly intact speech appears to be clear neurology  did note some mild impairment in fluency.  Psych he is alert and oriented pleasant and appropriate.  Labs.  October 09, 2017.  Sodium 132 potassium 5.7 BUN 24 creatinine 0.75.  WBC 13.5 hemoglobin 14.6 platelets 205.  Assessment and plan.  1.  Hyperkalemia this could very well be a hemolysis he does not appear symptomatic but will need to check this tomorrow to ensure stability-he is on minimal medications essentially Decadron and Keppra  #2 history of glioblastoma multiform left frontal lobe- status post resection he is on Decadron and  Keppra-per oncology note today Decadron is being titrated down-.  Per oncology note apparently plans are now for radiation 6 weeks of intensely modulated radiation therapy with concurrent daily temozolomide dosed-  #3 history of seizures-he is on Keppra this appears to be effective recommendation was to continue current Keppra dose per oncology  [phone] note greater than 25 minutes spent assessing patient- reviewing his chart and labs-and coordinating plan of care- of note greater than 50% of time spent coordinating plan of care with input as noted above -

## 2017-10-17 NOTE — Progress Notes (Signed)
[place] [facility] [place], [facility] [address] [phone]   New Patient Evaluation  Date of Service: 10/17/17 Patient Name: Christopher Morris Patient MRN: [id number] Patient DOB: 1941/02/17 Provider: Ventura Sellers, MD  Identifying Statement:  Christopher Morris is a 77 y.o. male with left frontal glioblastoma who presents for initial consultation and evaluation.    Referring Provider: No referring provider defined for this encounter.  Oncologic History:   Glioblastoma ([place])   09/25/2017 Initial Diagnosis    Glioblastoma ([place])      10/17/2017 -  Chemotherapy    The patient had [No matching medication found in this treatment plan]  for chemotherapy treatment.        Biomarkers:  MGMT Unknown.  IDH 1/2 Unknown.  EGFR Unknown  TERT Unknown   History of Present Illness: The patient's records from the referring physician were obtained and reviewed and the patient interviewed to confirm this HPI.  [name] presented to medical attention last month with new onset generalized seizure.  He was found down by a family member and transported to the emergency department.  There, imaging demonstrated an enhancing mass in the left frontal lobe.  He underwent craniotomy and near-total resection with Dr. Sherwood Gambler on 09/30/17; pathology demonstrated glioblastoma.  Post-operatively he had difficulty with speech and some right sided weakness, prompting discharge to inpatient rehabilitation where he is at currently.  He describes significant improvement in right sided weakness, he is manipulating utensils without issue and walking with cane assistance.  Dexamethasone is being administered 67m three times per day. He presents today to review treatment options moving forward for his glioblastoma.  Medications: Current Outpatient Medications on File Prior to Visit  Medication Sig Dispense Refill  . acetaminophen (TYLENOL) 325 MG tablet Take 2 tablets  (650 mg total) by mouth every 6 (six) hours as needed for mild pain (or Fever >/= 101).     No current facility-administered medications on file prior to visit.     Allergies:  Allergies  Allergen Reactions  . Pork-Derived Products     Patient does not eat pork  . Shellfish Allergy     Patient does not eat shellfish   Past Medical History: History reviewed. No pertinent past medical history. Past Surgical History:  Past Surgical History:  Procedure Laterality Date  . APPLICATION OF CRANIAL NAVIGATION N/A 09/30/2017   Procedure: APPLICATION OF CRANIAL NAVIGATION;  Surgeon: NJovita Gamma MD;  [place] [facility]  Service: Neurosurgery;  Laterality: N/A;  . CRANIOTOMY N/A 09/30/2017   Procedure: CRANIOTOMY FOR RESECTION OF BRAIN TUMOR WITH STEREOTACTIC;  Surgeon: NJovita Gamma MD;  [place] [facility]  Service: Neurosurgery;  Laterality: N/A;  CRANIOTOMY FOR RESECTION OF BRAIN TUMOR WITH STEREOTACTIC   Social History:  Social History   Socioeconomic History  . Marital status: Divorced    Spouse name: Not on file  . Number of children: Not on file  . Years of education: Not on file  . Highest education level: Not on file  Occupational History  . Not on file  Social Needs  . Financial resource strain: Not on file  . Food insecurity:    Worry: Not on file    Inability: Not on file  . Transportation needs:    Medical: No    Non-medical: No  Tobacco Use  . Smoking status: Never Smoker  . Smokeless tobacco: Never Used  Substance and Sexual Activity  . Alcohol use: No  Frequency: Never  . Drug use: No  . Sexual activity: Not Currently  Lifestyle  . Physical activity:    Days per week: 5 days    Minutes per session: Not on file  . Stress: Only a little  Relationships  . Social connections:    Talks on phone: Not on file    Gets together: Not on file    Attends religious service: Not on file    Active member of club or organization: Not on file    Attends meetings of  clubs or organizations: Not on file    Relationship status: Not on file  . Intimate partner violence:    Fear of current or ex partner: Not on file    Emotionally abused: Not on file    Physically abused: Not on file    Forced sexual activity: Not on file  Other Topics Concern  . Not on file  Social History Narrative  . Not on file   Family History:  Family History  Problem Relation Age of Onset  . Hypertension Daughter   . Stroke Mother   . Cancer Neg Hx     Review of Systems: Constitutional: Denies fevers, chills or abnormal weight loss Eyes: Denies blurriness of vision Ears, nose, mouth, throat, and face: Denies mucositis or sore throat Respiratory: Denies cough, dyspnea or wheezes Cardiovascular: Denies palpitation, chest discomfort or lower extremity swelling Gastrointestinal:  Denies nausea, constipation, diarrhea GU: Denies dysuria or incontinence Skin: Denies abnormal skin rashes Neurological: Per HPI Musculoskeletal: Denies joint pain, back or neck discomfort. No decrease in ROM Behavioral/Psych: Denies anxiety, disturbance in thought content, and mood instability  Physical Exam: Vitals:   10/17/17 1211  BP: 129/71  Pulse: 70  Resp: 17  Temp: 97.9 F (36.6 C)  SpO2: 99%   KPS: 80. General: Alert, cooperative, pleasant, in no acute distress Head: Craniotomy scar noted, dry and intact. EENT: No conjunctival injection or scleral icterus. Oral mucosa moist Lungs: Resp effort normal Cardiac: Regular rate and rhythm Abdomen: Soft, non-distended abdomen Skin: No rashes cyanosis or petechiae. Extremities: No clubbing or edema  Neurologic Exam: Mental Status: Awake, alert, attentive to examiner. Oriented to self and environment. Language has mild impairment in fluency with intact comprehension and repetition.  Cranial Nerves: Visual acuity is grossly normal. Visual fields are full. Extra-ocular movements intact. No ptosis. Face is symmetric, tongue  midline. Motor: Tone and bulk are normal. Right arm and leg 4+/5, left side 5/5 power. Reflexes are symmetric, no pathologic reflexes present. Intact finger to nose bilaterally Sensory: Intact to light touch and temperature Gait: Independent but purposeful and wide based   Labs: I have reviewed the data as listed    Component Value Date/Time   NA 136 10/03/2017 0510   K 4.5 10/03/2017 0510   CL 100 (L) 10/03/2017 0510   CO2 24 10/03/2017 0510   GLUCOSE 144 (H) 10/03/2017 0510   BUN 20 10/03/2017 0510   CREATININE 0.69 10/03/2017 0510   CALCIUM 8.4 (L) 10/03/2017 0510   PROT 6.5 09/25/2017 0834   ALBUMIN 3.9 09/25/2017 0834   AST 24 09/25/2017 0834   ALT 14 (L) 09/25/2017 0834   ALKPHOS 67 09/25/2017 0834   BILITOT 1.2 09/25/2017 0834   GFRNONAA >60 10/03/2017 0510   GFRAA >60 10/03/2017 0510   Lab Results  Component Value Date   WBC 11.4 (H) 10/03/2017   NEUTROABS 9.8 (H) 10/03/2017   HGB 13.4 10/03/2017   HCT 38.6 (L) 10/03/2017  MCV 97.0 10/03/2017   PLT 166 10/03/2017    Imaging: [place] Clinician Interpretation: I have personally reviewed the CNS images as listed.  My interpretation, in the context of the patient's clinical presentation, is post-operative changes  Dg Chest 2 View  Result Date: 09/25/2017 CLINICAL DATA:  Seizure EXAM: CHEST - 2 VIEW COMPARISON:  May 03, 2016 FINDINGS: There is no edema or consolidation. The heart size and pulmonary vascularity are normal. No adenopathy. No pneumothorax. No bone lesions. IMPRESSION: No edema or consolidation. Electronically Signed   By: Lowella Grip III M.D.   On: 09/25/2017 09:09   Ct Head Wo Contrast  Result Date: 09/27/2017 CLINICAL DATA:  First-time seizure. Intracranial mass lesion. Brain lab protocol prior to resection. EXAM: CT HEAD WITHOUT CONTRAST TECHNIQUE: Contiguous axial images were obtained from the base of the skull through the vertex without intravenous contrast. COMPARISON:  CT and MRI  09/25/2017 FINDINGS: Brain: BrainLAB protocol for operative utilization. Redemonstration of a necrotic mass in the medial left frontal vertex region with transverse diameter of 3.5 cm and cephalo caudal extent of 3.5-4 cm. Vasogenic edema in the left frontal lobe as seen previously. Small focus of hemorrhage along the superior margin of the tumor. No evidence of increased bleeding. No second lesion is evident. Mass effect results an left-to-right shift of 2 mm. No hydrocephalus. Vascular: There is atherosclerotic calcification of the major vessels at the base of the brain. Skull: Negative Sinuses/Orbits: Clear/normal Other: None IMPRESSION: BrainLAB protocol for operative utilization. Necrotic left posterior frontal vertex mass 3.5-4 cm in diameter as seen previously. Edema pattern is the same. Small amount of hemorrhage within the superior brim of the tumor is the same. Mass-effect is the same, with left-to-right shift of 2 mm. Electronically Signed   By: Nelson Chimes M.D.   On: 09/27/2017 09:06   Ct Head Wo Contrast  Result Date: 09/25/2017 CLINICAL DATA:  Seizure and fall out of bed. EXAM: CT HEAD WITHOUT CONTRAST CT CERVICAL SPINE WITHOUT CONTRAST TECHNIQUE: Multidetector CT imaging of the head and cervical spine was performed following the standard protocol without intravenous contrast. Multiplanar CT image reconstructions of the cervical spine were also generated. COMPARISON:  None. FINDINGS: CT HEAD FINDINGS Brain: There is a possible 3.0 cm mass in the left frontal lobe with prominent surrounding vasogenic edema. Gray-white matter differentiation is preserved. Small amount of hyperdensity along the superior aspect of the mass may reflect hemorrhage. There is slight rightward shift of the anterior falx and effacement of the left lateral ventricle frontal horn. No hydrocephalus or extra-axial collection. Vascular: Atherosclerotic vascular calcification of the carotid siphons. No hyperdense vessel. Skull:  Normal. Negative for fracture or focal lesion. Sinuses/Orbits: No acute finding. Other: None. CT CERVICAL SPINE FINDINGS Alignment: Reversal of the normal cervical lordosis. No traumatic malalignment. Skull base and vertebrae: No acute fracture. No primary bone lesion or focal pathologic process. Soft tissues and spinal canal: No prevertebral fluid or swelling. No visible canal hematoma. Disc levels: Moderate to severe degenerative disc disease and facet uncovertebral hypertrophy throughout the cervical spine. Multilevel moderate to severe neuroforaminal stenoses. Upper chest: Negative. Other: None. IMPRESSION: 1. Probable 3.0 cm mass in the left frontal lobe with prominent surrounding vasogenic edema. Small amount of hyperdensity along the superior aspect of the mass likely reflects a hemorrhagic component. Findings less likely represent hemorrhagic infarct given relative preservation of the gray-white differentiation. Recommend contrast-enhanced MRI of the brain for further evaluation. 2. No acute cervical spine fracture. Moderate to severe degenerative  changes throughout the cervical spine. Critical Value/emergent results were called by telephone at the time of interpretation on 09/25/2017 at 9:12 am to Dr. Isla Pence , who verbally acknowledged these results. Electronically Signed   By: Titus Dubin M.D.   On: 09/25/2017 09:17   Ct Chest W Contrast  Result Date: 09/25/2017 CLINICAL DATA:  Brain mass.  Evaluate for primary neoplasm. EXAM: CT CHEST, ABDOMEN, AND PELVIS WITH CONTRAST TECHNIQUE: Multidetector CT imaging of the chest, abdomen and pelvis was performed following the standard protocol during bolus administration of intravenous contrast. CONTRAST:  119m ISOVUE-300 IOPAMIDOL (ISOVUE-300) INJECTION 61% COMPARISON:  None FINDINGS: CT CHEST FINDINGS Cardiovascular: The heart size appears within normal limits. Aortic atherosclerosis. No pericardial effusion. Mediastinum/Nodes: Normal appearance of  the thyroid gland. The trachea appears patent and is midline. Normal appearance of the esophagus. No enlarged mediastinal or hilar lymph nodes. Lungs/Pleura: No pleural effusion. Mild subsegmental atelectasis or scar noted in the lung bases. No suspicious pulmonary nodule or mass identified. Musculoskeletal: No aggressive lytic or sclerotic bone lesions. CT ABDOMEN PELVIS FINDINGS Hepatobiliary: No focal liver abnormality is seen. No gallstones, gallbladder wall thickening, or biliary dilatation. Pancreas: Unremarkable. No pancreatic ductal dilatation or surrounding inflammatory changes. Spleen: Normal in size without focal abnormality. Adrenals/Urinary Tract: Normal appearance of the adrenal glands. The kidneys are both unremarkable. Urinary bladder appears normal. Stomach/Bowel: Stomach normal. The small bowel loops have a normal course and caliber. The appendix is visualized and appears normal. Unremarkable appearance of the colon. Vascular/Lymphatic: Aortic atherosclerosis. No aneurysm. No abdominal adenopathy. No pelvic or inguinal adenopathy. Reproductive: Prostate gland is unremarkable. Other: No free fluid or fluid collections. No peritoneal nodularity. 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 No aggressive lytic or sclerotic bone lesion. IMPRESSION: 1. No findings identified to indicate site of primary neoplastic process to explain brain mass. 2.  Aortic Atherosclerosis (ICD10-I70.0). Electronically Signed   By: TKerby MoorsM.D.   On: 09/25/2017 14:53   Ct Cervical Spine Wo Contrast  Result Date: 09/25/2017 CLINICAL DATA:  Seizure and fall out of bed. EXAM: CT HEAD WITHOUT CONTRAST CT CERVICAL SPINE WITHOUT CONTRAST TECHNIQUE: Multidetector CT imaging of the head and cervical spine was performed following the standard protocol without intravenous contrast. Multiplanar CT image reconstructions of the cervical spine were also generated. COMPARISON:  None. FINDINGS:  CT HEAD FINDINGS Brain: There is a possible 3.0 cm mass in the left frontal lobe with prominent surrounding vasogenic edema. Gray-white matter differentiation is preserved. Small amount of hyperdensity along the superior aspect of the mass may reflect hemorrhage. There is slight rightward shift of the anterior falx and effacement of the left lateral ventricle frontal horn. No hydrocephalus or extra-axial collection. Vascular: Atherosclerotic vascular calcification of the carotid siphons. No hyperdense vessel. Skull: Normal. Negative for fracture or focal lesion. Sinuses/Orbits: No acute finding. Other: None. CT CERVICAL SPINE FINDINGS Alignment: Reversal of the normal cervical lordosis. No traumatic malalignment. Skull base and vertebrae: No acute fracture. No primary bone lesion or focal pathologic process. Soft tissues and spinal canal: No prevertebral fluid or swelling. No visible canal hematoma. Disc levels: Moderate to severe degenerative disc disease and facet uncovertebral hypertrophy throughout the cervical spine. Multilevel moderate to severe neuroforaminal stenoses. Upper chest: Negative. Other: None. IMPRESSION: 1. Probable 3.0 cm mass in the left frontal lobe with prominent surrounding vasogenic edema. Small amount of hyperdensity along the superior aspect of the mass likely reflects a hemorrhagic component. Findings less likely represent hemorrhagic infarct given relative  preservation of the gray-white differentiation. Recommend contrast-enhanced MRI of the brain for further evaluation. 2. No acute cervical spine fracture. Moderate to severe degenerative changes throughout the cervical spine. Critical Value/emergent results were called by telephone at the time of interpretation on 09/25/2017 at 9:12 am to Dr. Isla Pence , who verbally acknowledged these results. Electronically Signed   By: Titus Dubin M.D.   On: 09/25/2017 09:17   [name] TD Contrast  Result Date: 10/02/2017 CLINICAL DATA:   Tumor resection 09/30/2016. EXAM: MRI HEAD WITHOUT AND WITH CONTRAST TECHNIQUE: Multiplanar, multiecho pulse sequences of the brain and surrounding structures were obtained without and with intravenous contrast. CONTRAST:  2m MULTIHANCE GADOBENATE DIMEGLUMINE 529 MG/ML IV SOLN COMPARISON:  Preoperative MRI of the brain 09/25/2017. FINDINGS: Brain: Left frontal craniotomy is noted. The bulk of the tumor has been resected. There is some residual enhancement along the posterior margin of the surgical cavity as well as the superomedial and inferolateral margins. The deep margin demonstrates no significant enhancement. Posteriorly the area of enhancement extends obliquely 3.2 cm. Blood products are evident within the surgical cavity. Extent of surrounding vasogenic edema is not significantly changed. Mass effect is significantly relieved. There is still some asymmetric impact on the frontal horn of the left lateral ventricle. Sulcal effacement over the convexity is relieved. Minimal midline shift remains. Mild atrophy and white matter disease is otherwise stable. No distal enhancement is present. Expected enhancement along the dura is noted following surgery. Vascular: Flow is present in the major intracranial arteries. Skull and upper cervical spine: The skull base is within normal limits. Degenerative changes are noted in the upper cervical spine, most evident at C3-4 were central canal stenosis is present. Degenerative endplate changes are present. Marrow signal is otherwise normal. Sinuses/Orbits: The paranasal sinuses and mastoid air cells are clear. Globes and orbits are within normal limits. IMPRESSION: 1. Near complete resection of tumor in the left frontal lobe. 2. Rim of residual enhancement posteriorly along the margin of the resection cavity with some enhancement superior medial and inferolateral. The majority of the surgical cavity does not enhance, suggesting some residual tumor posteriorly as described.  3. Surrounding vasogenic edema is stable with decreased mass effect. 4. Blood products are noted within the resection cavity. 5. Mild atrophy and white matter disease is otherwise stable. No acute infarct is present. Electronically Signed   By: CSan MorelleM.D.   On: 10/02/2017 12:48   Mr BJeri CosWVVContrast  Result Date: 09/25/2017 CLINICAL DATA:  [age]year old male found down by bed. Possible seizure, incontinent. Combative. EXAM: MRI HEAD WITHOUT AND WITH CONTRAST TECHNIQUE: Multiplanar, multiecho pulse sequences of the brain and surrounding structures were obtained without and with intravenous contrast. CONTRAST:  275mMULTIHANCE GADOBENATE DIMEGLUMINE 529 MG/ML IV SOLN COMPARISON:  Head CT without contrast 0849 hours today. FINDINGS: Brain: Partially hemorrhagic mass involving the medial left superior frontal gyrus, and left cingulate gyrus. The mass encompasses 36 x 28 x 39 millimeters (AP by transverse by CC), is heterogeneously T2 hyperintense, and solidly enhancing. The medial margin of the mass does abut the interhemispheric fissure, but the lesion appears intra-axial on all sequences. There is a small volume of late subacute appearing hemorrhage within the central aspect of the mass (intrinsic T1 hyperintensity on series 14, image 37) with surrounding hemosiderin. There is associated blood product diffusion artifact. There is restricted diffusion along the peripheral margins of the mass suggesting hyper cellularity. There is confluent surrounding T2 and FLAIR hyperintensity in  a pattern suggesting vasogenic edema. There is mild regional mass effect, mostly on the anterior body of the left lateral ventricle. There is trace local rightward midline shift. There is no larger area of restricted diffusion about the mass. No diffusion restriction elsewhere. No ventriculomegaly. No extra-axial or intraventricular blood. Basilar cisterns are normal. No other abnormal intracranial enhancement is  identified. No definite dural thickening. No other cerebral edema or mass effect. Outside of the anterior left frontal lobe there is mild for age symmetric bilateral nonspecific periventricular white matter T2 and FLAIR hyperintensity. No cortical encephalomalacia or definite chronic cerebral blood products. There is mild for age T2 heterogeneity in the bilateral deep gray matter nuclei. The brainstem and cerebellum appear normal. Cervicomedullary junction and pituitary are within normal limits. Vascular: Major intracranial vascular flow voids are preserved. Skull and upper cervical spine: Advanced degenerative changes in the visible cervical spine. Visualized bone marrow signal is within normal limits. Negative visible cervical spinal cord aside from mild degenerative stenosis. Sinuses/Orbits: Negative. Other: Visible internal auditory structures appear normal. Scalp and face soft tissues appear negative. IMPRESSION: 1. Solitary 3.9 cm intra-axial, enhancing, and partially hemorrhagic mass of the medial left superior frontal gyrus affecting the adjacent left cingulate gyrus. Surrounding vasogenic edema versus nonenhancing tumor. Mild regional mass effect. Top differential considerations are solitary metastasis and high-grade glioma. CNS lymphoma is less likely. 2. No superimposed infarct or other abnormal brain enhancement. 3. Mild for age signal changes in the cerebral white matter and deep gray matter compatible with chronic small vessel disease. Electronically Signed   By: Genevie Ann M.D.   On: 09/25/2017 12:39   Ct Abdomen Pelvis W Contrast  Result Date: 09/25/2017 CLINICAL DATA:  Brain mass.  Evaluate for primary neoplasm. EXAM: CT CHEST, ABDOMEN, AND PELVIS WITH CONTRAST TECHNIQUE: Multidetector CT imaging of the chest, abdomen and pelvis was performed following the standard protocol during bolus administration of intravenous contrast. CONTRAST:  162m ISOVUE-300 IOPAMIDOL (ISOVUE-300) INJECTION 61%  COMPARISON:  None FINDINGS: CT CHEST FINDINGS Cardiovascular: The heart size appears within normal limits. Aortic atherosclerosis. No pericardial effusion. Mediastinum/Nodes: Normal appearance of the thyroid gland. The trachea appears patent and is midline. Normal appearance of the esophagus. No enlarged mediastinal or hilar lymph nodes. Lungs/Pleura: No pleural effusion. Mild subsegmental atelectasis or scar noted in the lung bases. No suspicious pulmonary nodule or mass identified. Musculoskeletal: No aggressive lytic or sclerotic bone lesions. CT ABDOMEN PELVIS FINDINGS Hepatobiliary: No focal liver abnormality is seen. No gallstones, gallbladder wall thickening, or biliary dilatation. Pancreas: Unremarkable. No pancreatic ductal dilatation or surrounding inflammatory changes. Spleen: Normal in size without focal abnormality. Adrenals/Urinary Tract: Normal appearance of the adrenal glands. The kidneys are both unremarkable. Urinary bladder appears normal. Stomach/Bowel: Stomach normal. The small bowel loops have a normal course and caliber. The appendix is visualized and appears normal. Unremarkable appearance of the colon. Vascular/Lymphatic: Aortic atherosclerosis. No aneurysm. No abdominal adenopathy. No pelvic or inguinal adenopathy. Reproductive: Prostate gland is unremarkable. Other: No free fluid or fluid collections. No peritoneal nodularity. 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 No aggressive lytic or sclerotic bone lesion. IMPRESSION: 1. No findings identified to indicate site of primary neoplastic process to explain brain mass. 2.  Aortic Atherosclerosis (ICD10-I70.0). Electronically Signed   By: TKerby MoorsM.D.   On: 09/25/2017 14:53    Pathology:  Assessment/Plan 1. [place]  2. Seizure ([facility]  Mr. SHolzmannis clinically stable following surgery.  He is progressing nicely in particular with  motor function, now nearing the end of his  inpatient rehabilitation stay.  Today we extensively discussed his disease state, pathology, prognosis and treatment options therein.  He understands that we are still awaiting certain tumor molecular markers such as MGMT, IDH and EGFR status.    At this time our recommendation is to initiate 6 weeks of intensity modulated radiation therapy with concurrent daily temozolomide dosed at 339m/m2.  We discussed the potential side effects, including fatigue, constipation and cytopenias.    Chemotherapy should be held for the following:  ANC less than 1,000  Platelets less than 100,000  LFT or creatinine greater than 2x ULN  If clinical concerns/contraindications develop  Screening for potential clinical trials was performed and discussed using [facility] criteria [facility] tertiary centers, as well as national database available on [email or web address]    The patient is not a candidate for a research protocol at this time due to patient preference.   He should continue current dose of Keppra for seizure prevention.  Decadron should be decreased to 4339mBID for one week, then 39m62maily thereafter.    We spent twenty additional minutes teaching regarding the natural history, biology, and historical experience in the treatment of brain tumors. We then discussed in detail the current recommendations for therapy focusing on the mode of administration, mechanism of action, anticipated toxicities, and quality of life issues associated with this plan. We also provided teaching sheets for the patient to take home as an additional resource.  We appreciate the opportunity to participate in the care of JavRobbert LanglinaisHe should return to clinic during week 3 of radiation with a set of labs for evaluation.  All questions were answered. The patient knows to call the clinic with any problems, questions or concerns. No barriers to learning were detected.  The total time  spent in the encounter was 60 minutes and more than 50% was on counseling and review of test results   ZacVentura SellersD Medical Director of Neuro-Oncology [facility] [place]/04/19 3:23 PM

## 2017-10-17 NOTE — Addendum Note (Signed)
Encounter addended by: Cori Razor, RN on: 10/17/2017 1:26 PM  Actions taken: Charge Capture section accepted

## 2017-10-17 NOTE — Telephone Encounter (Signed)
Appointments scheduled AVS/Calendar printed per 4/4 los °

## 2017-10-17 NOTE — Progress Notes (Signed)
START ON PATHWAY REGIMEN - Neuro     One cycle, daily for 42 days concurrent with RT:     Temozolomide   **Always confirm dose/schedule in your pharmacy ordering system**    Patient Characteristics: Glioblastoma, Newly Diagnosed / Treatment Naive, Good Performance Status and/or Younger Patient Disease Classification: Glioma Disease Classification: Glioblastoma Disease Status: Newly Diagnosed / Treatment Naive Performance Status: Good Performance Status and/or Younger Patient Intent of Therapy: Non-Curative / Palliative Intent, Discussed with Patient

## 2017-10-17 NOTE — Progress Notes (Signed)
Radiation Oncology         [phone]) [phone] ________________________________  Name: Christopher Morris                  MRN: [id number]  Date of Service: .10/17/17          DOB: 1940-12-04  REFERRING PHYSICIAN: Dr. Sherwood Gambler   DIAGNOSIS: The primary encounter diagnosis was Seizure [facility]). Diagnoses of Brain tumor ([place]) and Vasogenic brain edema ([place]) were also pertinent to this visit.   HISTORY OF PRESENT ILLNESS: Christopher Morris is a 77 y.o. male originally seen in the postop inpatient setting at the request of Dr. Sherwood Gambler for a new diagnosis of glioblastoma of the left frontal lobe. The patient was brought to the hospital by EMS on 09/25/17 after a witnessed seizure. The patient was found on CT to have a possible 3 cm mass in the left frontal lobe with prominent vasogenic edema. There was slight rightward shift and effacement of the left lateral ventricle frontal horn. MRI on 09/25/17 also revealed this lesion and measured it at 36 x 28 x 39 mm and a small volume of late subacute hemorrhage within the central aspect of the mass was also noted. Trace local rightward shift was seen and he was seen by Dr. Sherwood Gambler. He was taken to the OR on 09/30/17 for craniotomy and resection. A post resection MRI on 10/02/17 revealed some residual enhancement along the superior medial and inferolateral aspect fo the cavity. The majority of the surgical cavity does not enhance. Surrounding edema is stable with decreased mass effect. He comes today to discuss options of treatment for his disease and meets with Dr. Mickeal Skinner today as a follow up to discuss systemic therapy.      PREVIOUS RADIATION THERAPY: No  Past Medical History: History reviewed. No pertinent past medical history.  Past Surgical History: Past Surgical History:  Procedure Laterality Date  . APPLICATION OF CRANIAL NAVIGATION N/A 09/30/2017   Procedure: APPLICATION OF CRANIAL NAVIGATION;  Surgeon: [name], MD;  Location: [place];  Service:  Neurosurgery;  Laterality: N/A;  . CRANIOTOMY N/A 09/30/2017   Procedure: CRANIOTOMY FOR RESECTION OF BRAIN TUMOR WITH STEREOTACTIC;  Surgeon: [name], MD;  Location: [place];  Service: Neurosurgery;  Laterality: N/A;  CRANIOTOMY FOR RESECTION OF BRAIN TUMOR WITH STEREOTACTIC    Social History:  Social History   Socioeconomic History  . Marital status: Divorced    Spouse name: Not on file  . Number of children: Not on file  . Years of education: Not on file  . Highest education level: Not on file  Occupational History  . Not on file  Social Needs  . Financial resource strain: Not on file  . Food insecurity:    Worry: Not on file    Inability: Not on file  . Transportation needs:    Medical: No    Non-medical: No  Tobacco Use  . Smoking status: Never Smoker  . Smokeless tobacco: Never Used  Substance and Sexual Activity  . Alcohol use: No    Frequency: Never  . Drug use: No  . Sexual activity: Not Currently  Lifestyle  . Physical activity:    Days per week: 5 days    Minutes per session: Not on file  . Stress: Only a little  Relationships  . Social connections:    Talks on phone: Not on file    Gets together: Not on file    Attends religious service: Not on file  Active member of club or organization: Not on file    Attends meetings of clubs or organizations: Not on file    Relationship status: Not on file  . Intimate partner violence:    Fear of current or ex partner: Not on file    Emotionally abused: Not on file    Physically abused: Not on file    Forced sexual activity: Not on file  Other Topics Concern  . Not on file  Social History Narrative  . Not on file    Family History: Family History  Problem Relation Age of Onset  . Hypertension Daughter   . Stroke Mother   . Cancer Neg Hx     ALLERGIES: Allergies  Allergen Reactions  . Pork-Derived Products     Patient does not eat pork  . Shellfish Allergy     Patient does not eat shellfish        MEDICATIONS:  Current Outpatient Medications  Medication Sig Dispense Refill  . acetaminophen (TYLENOL) 325 MG tablet Take 2 tablets (650 mg total) by mouth every 6 (six) hours as needed for mild pain (or Fever >/= 101).    Marland Kitchen dexamethasone (DECADRON) 4 MG tablet Take 1 tablet (4 mg total) by mouth every 8 (eight) hours. (Patient taking differently: Take 4 mg by mouth 3 (three) times daily. ) 30 tablet 0  . levETIRAcetam (KEPPRA) 500 MG tablet Take 1 tablet (500 mg total) by mouth 2 (two) times daily. 60 tablet 0   No current facility-administered medications for this encounter.      REVIEW OF SYSTEMS: On review of systems, the patient reports that he is doing well overall. He denies any headaches, seizures, dizziness, difficulty with hand coordination, visual deficits, or incisional pain. He denies any chest pain, shortness of breath, cough, fevers, chills, night sweats, unintended weight changes. He reports urinary urgency and occasional incontinence. He denies any bowel disturbances, and denies abdominal pain, nausea or vomiting. He reports improving right sided weakness. He denies any new musculoskeletal or joint aches or pains. He does report difficulty with word finding. He does report occasional incontinence at night. He reports waking up unsure of his location during times of incontinence. His daughter reports initially following surgery, he had a different personality, "he was a talker before surgery". She reports this has slowly improved since surgery and she sees glimpses of his original personality now. She also reports his handwriting has changed. She notes the handwriting is bigger and he occasionally spells incorrectly. The patient is currently taking 4 mg of Decadron every 8 hours. A complete review of systems is obtained and is otherwise negative.     PHYSICAL EXAM:   Wt Readings from Last 3 Encounters:  10/17/17 188 lb 3.2 oz (85.4 kg)  10/17/17 188 lb 9.6 oz (85.5  kg)  10/07/17 190 lb 14.7 oz (86.6 kg)   Temp Readings from Last 3 Encounters:  10/17/17 97.9 F (36.6 C) (Oral)  10/17/17 97.7 F (36.5 C) (Oral)  10/07/17 97.6 F (36.4 C) (Oral)   BP Readings from Last 3 Encounters:  10/17/17 129/71  10/17/17 (!) 144/63  10/07/17 113/76   Pulse Readings from Last 3 Encounters:  10/17/17 70  10/17/17 66  10/07/17 62   In general this is a well appearing african [place] male in no acute distress. He is alert and oriented x4 and appropriate throughout the examination. HEENT reveals that the patient is normocephalic, atraumatic. EOMs are intact. PERRLA. Skin is intact without any  evidence of gross lesions. Cardiovascular exam reveals a regular rate and rhythm, no clicks rubs or murmurs are auscultated. Chest is clear to auscultation bilaterally. Lymphatic assessment is performed and does not reveal any adenopathy in the cervical, supraclavicular, axillary, or inguinal chains. Abdomen has active bowel sounds in all quadrants and is intact. The abdomen is soft, non tender, non distended. Lower extremities are negative for pretibial pitting edema, deep calf tenderness, cyanosis or clubbing. Well healing craniotomy incision site along his frontal without signs of infection or drainage. Difficulty with word finding noted.   ECOG = 1  0 - Asymptomatic (Fully active, able to carry on all predisease activities without restriction)  1 - Symptomatic but completely ambulatory (Restricted in physically strenuous activity but ambulatory and able to carry out work of a light or sedentary nature. For example, light housework, office work)  2 - Symptomatic, <50% in bed during the day (Ambulatory and capable of all self care but unable to carry out any work activities. Up and about more than 50% of waking hours)  3 - Symptomatic, >50% in bed, but not bedbound (Capable of only limited self-care, confined to bed or chair 50% or more of waking hours)  4 -  Bedbound (Completely disabled. Cannot carry on any self-care. Totally confined to bed or chair)  5 - Death   Eustace Pen MM, Creech RH, Tormey DC, et al. [phone]). "Toxicity and response criteria of the [facility] Group". [place] Oncol. 5 (6): 649-55       IMPRESSION/PLAN: 1.         Glioblastoma of the left frontal lobe.  The patient's case was discussed in our multidisciplinary brain and spine conference next Wednesday, 10/09/2017.  I reviewed his case, and Dr. Mickeal Skinner has also weighed in.  He has undergone surgical resection.  His post surgical MRI does reveal some concerns for residual disease.  He appears to be a good candidate for adjuvant therapy, and we discussed briefly the options of radiotherapy in conjunction with temozolomide. The patient will see Dr. Mickeal Skinner later today to discuss his thoughts regarding combination therapy. We reviewed the risks, benefits, delivery and logistics of therapy.  He seems to be interested in participating in this. Dr. Lisbeth Renshaw anticipates 30 treatments of radiotherapy over 6 weeks. The patient will proceed with CT simulation and treatment planning at 11 AM today. A consent form was signed and a copy was provided for the patient. He will continue to taper his dexamethasone per Dr. Mickeal Skinner. 2.         Anticonvulsant therapy.  As the patient had a seizure at his presentation, he will continue Keppra per Dr. Mickeal Skinner and is aware that he cannot drive until he is 6 months seizure-free.  This will continue to be discussed at appropriate intervals.  In a visit lasting 45 minutes, greater than 50% of the time was spent face to face discussing his case, and coordinating the patient's care.   The above documentation reflects my direct findings during this shared patient visit. Please see the separate note by Dr. Lisbeth Renshaw on this date for the remainder of the patient's plan of care.    Carola Rhine, PAC  This document serves as a record of services  personally performed by Kyung Rudd, MD and Shona Simpson, PA-C. It was created on their behalf by Bethann Humble, a trained medical scribe. The creation of this record is based on the scribe's personal observations and the provider's statements to them. This  document has been checked and approved by the attending provider.

## 2017-10-18 ENCOUNTER — Telehealth: Payer: Self-pay | Admitting: Pharmacy Technician

## 2017-10-18 ENCOUNTER — Telehealth: Payer: Self-pay | Admitting: Pharmacist

## 2017-10-18 ENCOUNTER — Encounter (HOSPITAL_COMMUNITY): Payer: Self-pay | Admitting: Neurosurgery

## 2017-10-18 DIAGNOSIS — C719 Malignant neoplasm of brain, unspecified: Secondary | ICD-10-CM

## 2017-10-18 LAB — BASIC METABOLIC PANEL
BUN: 19 (ref 4–21)
CREATININE: 0.6 (ref 0.6–1.3)
Glucose: 115
POTASSIUM: 5 (ref 3.4–5.3)
Sodium: 136 — AB (ref 137–147)

## 2017-10-18 NOTE — Telephone Encounter (Signed)
Oral Oncology Patient Advocate Encounter  Received notification from [facility] that prior authorization for Temodar is required.  Prior Authorization requests for 140mg  and 20mg  capsules have been submitted on CoverMyMeds  Key (140mg  capsules) [id number] Key (20mg  capsules) [id number]  Status is pending  Oral Oncology Clinic will continue to follow for approval.  Fabio Asa. Melynda Keller, [place] Patient [place] [phone] 10/18/2017 9:47 AM

## 2017-10-18 NOTE — Telephone Encounter (Signed)
Oral Oncology Pharmacist Encounter  Received new prescription for Temodar (temozolomide) for the treatment of glioblastoma multiform in conjunction with radiation therapy, planned duration 6 weeks of treatment for concurrent phase. Noted patient with recent admission for seizure, was precipitating factor for diagnosis of disease. Patient is status post craniotomy on 09/30/2017  Labs from Epic assessed, okay for treatment.  Current medication list in Epic reviewed, no DDIs with Temodar identified.  Prescription has been e-scribed to the [facility] for benefits analysis and approval. Prior authorization was required for both strengths of the Temodar to equal patient's total daily dose, and PAs have both been submitted.  Status remains pending on both.  Oral Oncology Clinic will continue to follow for insurance authorization, copayment issues, initial counseling and start date.  [name], PharmD, BCPS, BCOP 10/18/2017 12:42 PM Oral Oncology Clinic [phone]

## 2017-10-21 ENCOUNTER — Encounter: Payer: Self-pay | Admitting: Adult Health

## 2017-10-21 ENCOUNTER — Non-Acute Institutional Stay: Payer: BLUE CROSS/BLUE SHIELD | Admitting: Adult Health

## 2017-10-21 DIAGNOSIS — C719 Malignant neoplasm of brain, unspecified: Secondary | ICD-10-CM | POA: Diagnosis not present

## 2017-10-21 DIAGNOSIS — R569 Unspecified convulsions: Secondary | ICD-10-CM | POA: Diagnosis not present

## 2017-10-21 NOTE — Progress Notes (Signed)
.  Location:   [place] Room Number: [place] of Service:  SNF (31)    CODE STATUS: Full Code  Allergies  Allergen Reactions  . Pork-Derived Products     Patient does not eat pork  . Shellfish Allergy     Patient does not eat shellfish    Chief Complaint  Patient presents with  . Discharge Note    Discharging to home on 4/10/1    HPI:  He is being discharged to home and family with home health for pt/ot/rn/st. He will not need dme. He will need his prescriptions to be written and will need to follow up with his medical provider.  He had been hospitalized for glioblastoma status post resection. He was admitted to this facility for short term rehab. He will be discharged with family.     Past Medical History:  Diagnosis Date  . Glioblastoma ([place]) 09/25/2017  . Hyperglycemia 09/25/2017  . Seizure ([place]) 09/25/2017    Past Surgical History:  Procedure Laterality Date  . APPLICATION OF CRANIAL NAVIGATION N/A 09/30/2017   Procedure: APPLICATION OF CRANIAL NAVIGATION;  Surgeon: [name], MD;  Location: [place];  Service: Neurosurgery;  Laterality: N/A;  . CRANIOTOMY N/A 09/30/2017   Procedure: CRANIOTOMY FOR RESECTION OF BRAIN TUMOR WITH STEREOTACTIC;  Surgeon: [name], MD;  Location: [place];  Service: Neurosurgery;  Laterality: N/A;  CRANIOTOMY FOR RESECTION OF BRAIN TUMOR WITH STEREOTACTIC    Social History   Socioeconomic History  . Marital status: Divorced    Spouse name: Not on file  . Number of children: Not on file  . Years of education: Not on file  . Highest education level: Not on file  Occupational History  . Not on file  Social Needs  . Financial resource strain: Not on file  . Food insecurity:    Worry: Not on file    Inability: Not on file  . Transportation needs:    Medical: No    Non-medical: No  Tobacco Use  . Smoking status: Never Smoker  . Smokeless tobacco: Never Used  Substance and Sexual  Activity  . Alcohol use: No    Frequency: Never  . Drug use: No  . Sexual activity: Not Currently  Lifestyle  . Physical activity:    Days per week: 5 days    Minutes per session: Not on file  . Stress: Only a little  Relationships  . Social connections:    Talks on phone: Not on file    Gets together: Not on file    Attends religious service: Not on file    Active member of club or organization: Not on file    Attends meetings of clubs or organizations: Not on file    Relationship status: Not on file  . Intimate partner violence:    Fear of current or ex partner: Not on file    Emotionally abused: Not on file    Physically abused: Not on file    Forced sexual activity: Not on file  Other Topics Concern  . Not on file  Social History Narrative  . Not on file   Family History  Problem Relation Age of Onset  . Hypertension Daughter   . Stroke Mother   . Cancer Neg Hx     VITAL SIGNS BP 138/64   Pulse 86   Temp 98.2 F (36.8 C)   Resp 20   Ht 6\' 4"  (1.93 m)  Wt 190 lb 14.7 oz (86.6 kg)   BMI 23.24 kg/m   Patient's Medications  New Prescriptions   No medications on file  Previous Medications   ACETAMINOPHEN (TYLENOL) 325 MG TABLET    Take 2 tablets (650 mg total) by mouth every 6 (six) hours as needed for mild pain (or Fever >/= 101).   DEXAMETHASONE (DECADRON) 4 MG TABLET    Take 1 tablet (4 mg total) by mouth 2 (two) times daily.   LEVETIRACETAM (KEPPRA) 500 MG TABLET    Take 1 tablet (500 mg total) by mouth 2 (two) times daily.  Modified Medications   No medications on file  Discontinued Medications   ONDANSETRON (ZOFRAN) 8 MG TABLET    Take 1 tablet (8 mg total) by mouth 2 (two) times daily as needed. Start on the third day after chemotherapy.   TEMOZOLOMIDE (TEMODAR) 140 MG CAPSULE    Take 1 capsule (140 mg total) by mouth daily. May take on an empty stomach to decrease nausea & vomiting.   TEMOZOLOMIDE (TEMODAR) 20 MG CAPSULE    Take 1 capsule (20 mg  total) by mouth daily. May take on an empty stomach to decrease nausea & vomiting.     SIGNIFICANT DIAGNOSTIC EXAMS   LABS REVIEWED: TODAY;   10-03-17: wbc 11.4; hgb 13.4; creat 38.6; mcv 97.0; plt 166 glucose 144; bun 20; creat 0.69; k+ 4.5; na++ 136 ca 8.4 ..   Review of Systems  Reason unable to perform ROS: poor historian   Constitutional: Negative for malaise/fatigue.  Respiratory: Negative for cough.   Cardiovascular: Negative for chest pain and leg swelling.  Gastrointestinal: Negative for constipation and heartburn.  Musculoskeletal: Negative for back pain and myalgias.  Skin: Negative.   Psychiatric/Behavioral: The patient is not nervous/anxious.     Physical Exam  Constitutional: He appears well-developed and well-nourished. No distress.  Neck: No thyromegaly present.  Cardiovascular: Normal rate, regular rhythm, normal heart sounds and intact distal pulses.  Pulmonary/Chest: Effort normal and breath sounds normal. No respiratory distress.  Abdominal: Soft. Bowel sounds are normal.  Musculoskeletal: He exhibits no edema.  Is able to move all extremities has right side weakness present   Lymphadenopathy:    He has no cervical adenopathy.  Neurological: He is alert.  Skin: Skin is warm and dry. He is not diaphoretic.  Psychiatric: He has a normal mood and affect.    ASSESSMENT/ PLAN:  Patient is being discharged with the following home health services:  Pt/ot/rn/cna/st: to evaluate and treat as indicated for gait balance strength; adl training; medication management adl care; cognition   Patient is being discharged with the following durable medical equipment:  None needed   Patient has been advised to f/u with their PCP in 1-2 weeks to bring them up to date on their rehab stay.  Social services at facility was responsible for arranging this appointment.  Pt was provided with a 30 day supply of prescriptions for medications and refills must be obtained from their  PCP.  For controlled substances, a more limited supply may be provided adequate until PCP appointment only.   A 30 days supply of his medication have been written for his prescription medications per the list as above.    Time spent with patient 35 minutes: discussed home health needs; no dme needed; discharge home health expectations; medications verbalized understanding.        Ok Edwards NP [facility]  Contact [phone] Monday through Friday 8am- 5pm  After  hours call [phone]

## 2017-10-22 NOTE — Telephone Encounter (Signed)
Oral Oncology Patient Advocate Encounter  Prior Authorization for Temozolamide has been approved.    Effective dates: 10/18/2017 through 10/17/2018  The CoverMyMeds request for the 140mg  was marked approved while the requset for 20mg  capsules was marked as cancelled due to being a "duplicate request."  I contacted [employer] to confirm that both strengths had been approved under the patient's plan.    I was informed that both strengths necessary to make the 160mg  total dose would be covered under the single approval and that the pharmacy would not have any issues processing either prescription.   Oral Oncology Clinic will continue to follow.   Fabio Asa. Melynda Keller, [place] Patient [place] [phone] 10/22/2017 12:06 PM

## 2017-10-23 DIAGNOSIS — C711 Malignant neoplasm of frontal lobe: Secondary | ICD-10-CM | POA: Diagnosis not present

## 2017-10-24 MED ORDER — TEMOZOLOMIDE 140 MG PO CAPS: ORAL_CAPSULE | ORAL | 0 refills | Status: DC

## 2017-10-24 MED ORDER — TEMOZOLOMIDE 20 MG PO CAPS: ORAL_CAPSULE | ORAL | 0 refills | Status: DC

## 2017-10-24 NOTE — Telephone Encounter (Signed)
Oral Chemotherapy Pharmacist Encounter   Attempted to reach patient to provide update and offer for initial counseling on oral medication: Temodar.  No answer. Left VM for patient to call back with any questions or issues.   I left information on patient's voicemail that insurance authorization for his oral treatment to go along with his treatments that start on Monday, 10/28/2017 have been approved.  Also informed patient that prescription is being filled at [facility] per insurance requirement and left phone number to dispensing pharmacy on voicemail [phone]).  I left instructions on patient's voicemail to reach out to dispensing pharmacy by the middle of the day on 10/25/2017 if he had not yet heard from them to schedule delivery of his medication.  I also left number to oral oncology clinic on voicemail if patient has any additional questions or concerns.  Oral oncology clinic will continue to follow.  Thank you,  [name], PharmD, BCPS, BCOP 10/24/2017   9:33 AM Oral Oncology Clinic [phone]

## 2017-10-25 ENCOUNTER — Encounter (HOSPITAL_COMMUNITY): Payer: Self-pay | Admitting: *Deleted

## 2017-10-25 ENCOUNTER — Emergency Department (HOSPITAL_COMMUNITY): Payer: Medicare Other

## 2017-10-25 ENCOUNTER — Inpatient Hospital Stay (HOSPITAL_COMMUNITY)
Admission: EM | Admit: 2017-10-25 | Discharge: 2017-10-28 | DRG: 872 | Disposition: A | Discharge status: Home Health | Payer: Medicare Other | Attending: Internal Medicine | Admitting: Internal Medicine

## 2017-10-25 ENCOUNTER — Other Ambulatory Visit: Payer: Self-pay

## 2017-10-25 DIAGNOSIS — R7881 Bacteremia: Secondary | ICD-10-CM

## 2017-10-25 DIAGNOSIS — C711 Malignant neoplasm of frontal lobe: Secondary | ICD-10-CM | POA: Diagnosis present

## 2017-10-25 DIAGNOSIS — G40909 Epilepsy, unspecified, not intractable, without status epilepticus: Secondary | ICD-10-CM | POA: Diagnosis not present

## 2017-10-25 DIAGNOSIS — R6889 Other general symptoms and signs: Secondary | ICD-10-CM | POA: Diagnosis not present

## 2017-10-25 DIAGNOSIS — D696 Thrombocytopenia, unspecified: Secondary | ICD-10-CM | POA: Diagnosis present

## 2017-10-25 DIAGNOSIS — A4151 Sepsis due to Escherichia coli [E. coli]: Secondary | ICD-10-CM | POA: Diagnosis not present

## 2017-10-25 DIAGNOSIS — R509 Fever, unspecified: Secondary | ICD-10-CM | POA: Diagnosis not present

## 2017-10-25 DIAGNOSIS — Z8249 Family history of ischemic heart disease and other diseases of the circulatory system: Secondary | ICD-10-CM

## 2017-10-25 DIAGNOSIS — Z9889 Other specified postprocedural states: Secondary | ICD-10-CM

## 2017-10-25 DIAGNOSIS — Z91013 Allergy to seafood: Secondary | ICD-10-CM

## 2017-10-25 DIAGNOSIS — Z823 Family history of stroke: Secondary | ICD-10-CM

## 2017-10-25 DIAGNOSIS — R569 Unspecified convulsions: Secondary | ICD-10-CM

## 2017-10-25 DIAGNOSIS — D638 Anemia in other chronic diseases classified elsewhere: Secondary | ICD-10-CM | POA: Diagnosis present

## 2017-10-25 DIAGNOSIS — N390 Urinary tract infection, site not specified: Secondary | ICD-10-CM | POA: Diagnosis present

## 2017-10-25 DIAGNOSIS — R739 Hyperglycemia, unspecified: Secondary | ICD-10-CM | POA: Diagnosis present

## 2017-10-25 DIAGNOSIS — Z91018 Allergy to other foods: Secondary | ICD-10-CM

## 2017-10-25 DIAGNOSIS — Z79899 Other long term (current) drug therapy: Secondary | ICD-10-CM

## 2017-10-25 DIAGNOSIS — Z7952 Long term (current) use of systemic steroids: Secondary | ICD-10-CM

## 2017-10-25 DIAGNOSIS — R6520 Severe sepsis without septic shock: Secondary | ICD-10-CM | POA: Diagnosis present

## 2017-10-25 DIAGNOSIS — T380X5A Adverse effect of glucocorticoids and synthetic analogues, initial encounter: Secondary | ICD-10-CM | POA: Diagnosis present

## 2017-10-25 LAB — CBC WITH DIFFERENTIAL/PLATELET
BASOS PCT: 0 %
Basophils Absolute: 0 10*3/uL (ref 0.0–0.1)
Eosinophils Absolute: 0 10*3/uL (ref 0.0–0.7)
Eosinophils Relative: 0 %
HCT: 36.4 % — ABNORMAL LOW (ref 39.0–52.0)
HEMOGLOBIN: 11.9 g/dL — AB (ref 13.0–17.0)
LYMPHS ABS: 0.6 10*3/uL (ref 0.7–4.0)
Lymphocytes Relative: 8 %
MCH: 31.8 pg (ref 26.0–34.0)
MCHC: 32.7 g/dL (ref 30.0–36.0)
MCV: 97.3 fL (ref 78.0–100.0)
MONOS PCT: 7 %
Monocytes Absolute: 0.5 10*3/uL (ref 0.1–1.0)
NEUTROS ABS: 6.2 10*3/uL (ref 1.7–7.7)
NEUTROS PCT: 85 %
Platelets: 98 10*3/uL — ABNORMAL LOW (ref 150–400)
RBC: 3.74 MIL/uL — AB (ref 4.22–5.81)
RDW: 13.2 % (ref 11.5–15.5)
WBC MORPHOLOGY: INCREASED
WBC: 7.4 10*3/uL (ref 4.0–10.5)

## 2017-10-25 LAB — BLOOD CULTURE ID PANEL (REFLEXED)
Acinetobacter baumannii: NOT DETECTED
CANDIDA ALBICANS: NOT DETECTED
CANDIDA GLABRATA: NOT DETECTED
CANDIDA KRUSEI: NOT DETECTED
CANDIDA PARAPSILOSIS: NOT DETECTED
CANDIDA TROPICALIS: NOT DETECTED
Carbapenem resistance: NOT DETECTED
ENTEROBACTER CLOACAE COMPLEX: NOT DETECTED
Enterobacteriaceae species: DETECTED — AB
Enterococcus species: NOT DETECTED
Escherichia coli: DETECTED — AB
HAEMOPHILUS INFLUENZAE: NOT DETECTED
KLEBSIELLA OXYTOCA: NOT DETECTED
KLEBSIELLA PNEUMONIAE: NOT DETECTED
Listeria monocytogenes: NOT DETECTED
Neisseria meningitidis: NOT DETECTED
PROTEUS SPECIES: NOT DETECTED
PSEUDOMONAS AERUGINOSA: NOT DETECTED
STREPTOCOCCUS PYOGENES: NOT DETECTED
STREPTOCOCCUS SPECIES: NOT DETECTED
Serratia marcescens: NOT DETECTED
Staphylococcus aureus (BCID): NOT DETECTED
Staphylococcus species: NOT DETECTED
Streptococcus agalactiae: NOT DETECTED
Streptococcus pneumoniae: NOT DETECTED

## 2017-10-25 LAB — PROTIME-INR
INR: 0.99
PROTHROMBIN TIME: 13 s (ref 11.4–15.2)

## 2017-10-25 LAB — COMPREHENSIVE METABOLIC PANEL
ALT: 43 U/L (ref 17–63)
ANION GAP: 10 (ref 5–15)
AST: 23 U/L (ref 15–41)
Albumin: 2.6 g/dL — ABNORMAL LOW (ref 3.5–5.0)
Alkaline Phosphatase: 81 U/L (ref 38–126)
BUN: 24 mg/dL — ABNORMAL HIGH (ref 6–20)
CHLORIDE: 98 mmol/L — AB (ref 101–111)
CO2: 28 mmol/L (ref 22–32)
Calcium: 8.3 mg/dL — ABNORMAL LOW (ref 8.9–10.3)
Creatinine, Ser: 0.94 mg/dL (ref 0.61–1.24)
GFR calc non Af Amer: >60 mL/min (ref >60)
Glucose, Bld: 102 mg/dL — ABNORMAL HIGH (ref 65–99)
Potassium: 4.4 mmol/L (ref 3.5–5.1)
SODIUM: 136 mmol/L (ref 135–145)
Total Bilirubin: 1 mg/dL (ref 0.3–1.2)
Total Protein: 5.3 g/dL — ABNORMAL LOW (ref 6.5–8.1)

## 2017-10-25 LAB — URINALYSIS, ROUTINE W REFLEX MICROSCOPIC
Bilirubin Urine: NEGATIVE
GLUCOSE, UA: NEGATIVE mg/dL
KETONES UR: NEGATIVE mg/dL
Nitrite: NEGATIVE
PH: 6 (ref 5.0–8.0)
Protein, ur: 100 mg/dL — AB
SPECIFIC GRAVITY, URINE: 1.015 (ref 1.005–1.030)
Squamous Epithelial / LPF: NONE SEEN

## 2017-10-25 LAB — GLUCOSE, CAPILLARY
GLUCOSE-CAPILLARY: 249 mg/dL — AB (ref 65–99)
Glucose-Capillary: 175 mg/dL — ABNORMAL HIGH (ref 65–99)

## 2017-10-25 LAB — RAPID URINE DRUG SCREEN, HOSP PERFORMED
Amphetamines: NOT DETECTED
BENZODIAZEPINES: NOT DETECTED
Barbiturates: NOT DETECTED
Cocaine: NOT DETECTED
Opiates: NOT DETECTED
Tetrahydrocannabinol: NOT DETECTED

## 2017-10-25 LAB — INFLUENZA PANEL BY PCR (TYPE A & B)
Influenza A By PCR: NEGATIVE
Influenza B By PCR: NEGATIVE

## 2017-10-25 LAB — SALICYLATE LEVEL

## 2017-10-25 LAB — ETHANOL: Alcohol, Ethyl (B): <10 mg/dL (ref <10)

## 2017-10-25 LAB — I-STAT CG4 LACTIC ACID, ED
LACTIC ACID, VENOUS: 1.85 mmol/L (ref 0.5–1.9)
Lactic Acid, Venous: 1.72 mmol/L (ref 0.5–1.9)

## 2017-10-25 LAB — ACETAMINOPHEN LEVEL

## 2017-10-25 LAB — CBG MONITORING, ED: Glucose-Capillary: 104 mg/dL — ABNORMAL HIGH (ref 65–99)

## 2017-10-25 MED ORDER — INSULIN ASPART 100 UNIT/ML ~~LOC~~ SOLN
0.0000 [IU] | Freq: Every day | SUBCUTANEOUS | Status: DC
  Administered 2017-10-25 – 2017-10-26 (×2): 2 [IU] via SUBCUTANEOUS
  Administered 2017-10-27: 3 [IU] via SUBCUTANEOUS

## 2017-10-25 MED ORDER — OXYCODONE HCL 5 MG PO TABS: 5.0000 mg | ORAL_TABLET | ORAL | Status: DC | PRN

## 2017-10-25 MED ORDER — SODIUM CHLORIDE 0.9 % IV SOLN
1.0000 g | Freq: Once | INTRAVENOUS | Status: AC
  Administered 2017-10-25: 1 g via INTRAVENOUS
  Filled 2017-10-25: qty 10

## 2017-10-25 MED ORDER — SODIUM CHLORIDE 0.9 % IV BOLUS
1000.0000 mL | Freq: Once | INTRAVENOUS | Status: AC
  Administered 2017-10-25: 1000 mL via INTRAVENOUS

## 2017-10-25 MED ORDER — LACTATED RINGERS IV SOLN
INTRAVENOUS | Status: DC
  Administered 2017-10-25 – 2017-10-27 (×5): via INTRAVENOUS

## 2017-10-25 MED ORDER — ACETAMINOPHEN 650 MG RE SUPP: 650.0000 mg | Freq: Four times a day (QID) | RECTAL | Status: DC | PRN

## 2017-10-25 MED ORDER — INSULIN ASPART 100 UNIT/ML ~~LOC~~ SOLN
0.0000 [IU] | Freq: Three times a day (TID) | SUBCUTANEOUS | Status: DC
  Administered 2017-10-25 – 2017-10-26 (×2): 2 [IU] via SUBCUTANEOUS
  Administered 2017-10-26 – 2017-10-27 (×3): 3 [IU] via SUBCUTANEOUS
  Administered 2017-10-27: 2 [IU] via SUBCUTANEOUS
  Administered 2017-10-27: 3 [IU] via SUBCUTANEOUS
  Administered 2017-10-28: 2 [IU] via SUBCUTANEOUS
  Administered 2017-10-28: 7 [IU] via SUBCUTANEOUS

## 2017-10-25 MED ORDER — SODIUM CHLORIDE 0.9 % IV SOLN
INTRAVENOUS | Status: DC
  Administered 2017-10-25: 08:00:00 via INTRAVENOUS

## 2017-10-25 MED ORDER — ACETAMINOPHEN 325 MG PO TABS: 650.0000 mg | ORAL_TABLET | Freq: Four times a day (QID) | ORAL | Status: DC | PRN

## 2017-10-25 MED ORDER — DEXAMETHASONE 4 MG PO TABS
4.0000 mg | ORAL_TABLET | Freq: Four times a day (QID) | ORAL | Status: DC
  Administered 2017-10-25 – 2017-10-28 (×13): 4 mg via ORAL
  Filled 2017-10-25 (×14): qty 1

## 2017-10-25 MED ORDER — SODIUM CHLORIDE 0.9 % IV SOLN
1.0000 g | INTRAVENOUS | Status: DC
  Administered 2017-10-25: 1 g via INTRAVENOUS
  Filled 2017-10-25: qty 10
  Filled 2017-10-25: qty 1

## 2017-10-25 MED ORDER — SODIUM CHLORIDE 0.9 % IV SOLN
2.0000 g | INTRAVENOUS | Status: DC
  Administered 2017-10-26 – 2017-10-27 (×2): 2 g via INTRAVENOUS
  Filled 2017-10-25 (×2): qty 2
  Filled 2017-10-25: qty 20

## 2017-10-25 MED ORDER — LEVETIRACETAM 500 MG PO TABS
500.0000 mg | ORAL_TABLET | Freq: Two times a day (BID) | ORAL | Status: DC
  Administered 2017-10-25 – 2017-10-28 (×7): 500 mg via ORAL
  Filled 2017-10-25 (×7): qty 1

## 2017-10-25 MED ORDER — ACETAMINOPHEN 325 MG PO TABS
650.0000 mg | ORAL_TABLET | Freq: Once | ORAL | Status: AC | PRN
  Administered 2017-10-25: 650 mg via ORAL
  Filled 2017-10-25: qty 2

## 2017-10-25 MED ORDER — LACTATED RINGERS IV BOLUS
1000.0000 mL | Freq: Once | INTRAVENOUS | Status: AC
  Administered 2017-10-25: 1000 mL via INTRAVENOUS

## 2017-10-25 MED ORDER — SODIUM CHLORIDE 0.9 % IV SOLN
1.0000 g | INTRAVENOUS | Status: DC
  Filled 2017-10-25: qty 10

## 2017-10-25 MED ORDER — DEXAMETHASONE 4 MG PO TABS: 4.0000 mg | ORAL_TABLET | Freq: Two times a day (BID) | ORAL | Status: DC

## 2017-10-25 NOTE — ED Notes (Signed)
Pt is still trying to urinate

## 2017-10-25 NOTE — ED Notes (Signed)
Bed: WA09 Expected date:  Expected time:  Means of arrival:  Comments: 77 yo M/Seizure

## 2017-10-25 NOTE — ED Notes (Signed)
Report given to kimberly, RN.  

## 2017-10-25 NOTE — ED Notes (Signed)
Pt is unable to urinate at this time. Pt has attempted, will check back

## 2017-10-25 NOTE — ED Provider Notes (Signed)
[facility] DEPT Provider Note   CSN: [id number] Arrival date & time: 10/25/17  0055     History   Chief Complaint Chief Complaint  Patient presents with  . Fever    HPI Christopher Morris is a 77 y.o. male.  Patient from home with fever.  States he woke up to go to the bathroom and started shaking all over.  He called EMS and was found to be febrile at 102.7.  It was reported that he had a seizure but this does not appear to be the case.  Patient states he was shaking due to his temperature.  He does have a history of seizure disorder secondary to glioblastoma which was resected a month ago.  He does state compliance with his seizure medications.  He denies any coughing, runny nose or sore throat.  Denies any nausea or vomiting.  Denies any pain with urination or blood in the urine.  Denies any abdominal pain.  He feels back to baseline now.  No focal weakness, numbness or tingling.  No bowel or bladder incontinence.  Patient underwent craniotomy on March 18 and states compliance of the medication since.  The history is provided by the patient and the EMS personnel.  Fever   Pertinent negatives include no chest pain, no vomiting, no congestion and no headaches.    Past Medical History:  Diagnosis Date  . Glioblastoma ([place]) 09/25/2017  . Hyperglycemia 09/25/2017  . Seizure ([place]) 09/25/2017    Patient Active Problem List   Diagnosis Date Noted  . Seizure ([place]) 09/25/2017  . Glioblastoma ([place]) 09/25/2017  . Hyperglycemia 09/25/2017  . Leukocytosis 09/25/2017  . Hypokalemia 09/25/2017  . BLOOD IN STOOL 07/26/2008  . ERECTILE DYSFUNCTION 07/12/2008    Past Surgical History:  Procedure Laterality Date  . APPLICATION OF CRANIAL NAVIGATION N/A 09/30/2017   Procedure: APPLICATION OF CRANIAL NAVIGATION;  Surgeon: [name], MD;  Location: [facility];  Service: Neurosurgery;  Laterality: N/A;  . CRANIOTOMY N/A 09/30/2017   Procedure: CRANIOTOMY FOR  RESECTION OF BRAIN TUMOR WITH STEREOTACTIC;  Surgeon: [name], MD;  Location: [facility];  Service: Neurosurgery;  Laterality: N/A;  CRANIOTOMY FOR RESECTION OF BRAIN TUMOR WITH STEREOTACTIC        Home Medications    Prior to Admission medications   Medication Sig Start Date End Date Taking? Authorizing Provider  acetaminophen (TYLENOL) 325 MG tablet Take 2 tablets (650 mg total) by mouth every 6 (six) hours as needed for mild pain (or Fever >/= 101). Patient not taking: Reported on 10/25/2017 10/04/17   Velvet Bathe, MD  dexamethasone (DECADRON) 4 MG tablet Take 1 tablet (4 mg total) by mouth 2 (two) times daily. 10/17/17   Ventura Sellers, MD  levETIRAcetam (KEPPRA) 500 MG tablet Take 1 tablet (500 mg total) by mouth 2 (two) times daily. 10/17/17   Ventura Sellers, MD  temozolomide (TEMODAR) 140 MG capsule Take 1 capsule (140mg ) by mouth once daily. Take with 20mg  capsule for 160mg  total daily dose. May take on an empty stomach to decrease N&V 10/24/17   Vaslow, Acey Lav, MD  temozolomide (TEMODAR) 20 MG capsule Take 1 capsule (20mg ) by mouth once daily. Take with 140mg  capsule for 160mg  total daily dose. May take on an empty stomach to decrease N&V 10/24/17   Vaslow, Acey Lav, MD    Family History Family History  Problem Relation Age of Onset  . Hypertension Daughter   . Stroke Mother   . Cancer Neg  Hx     Social History Social History   Tobacco Use  . Smoking status: Never Smoker  . Smokeless tobacco: Never Used  Substance Use Topics  . Alcohol use: No    Frequency: Never  . Drug use: No     Allergies   Pork-derived products and Shellfish allergy   Review of Systems Review of Systems  Constitutional: Positive for fever. Negative for activity change, appetite change and fatigue.  HENT: Negative for congestion and rhinorrhea.   Respiratory: Negative for chest tightness and shortness of breath.   Cardiovascular: Negative for chest pain.  Gastrointestinal:  Negative for abdominal pain, nausea and vomiting.  Genitourinary: Negative for dysuria, hematuria and testicular pain.  Musculoskeletal: Negative for arthralgias, back pain, myalgias and neck pain.  Skin: Negative for rash.  Neurological: Negative for dizziness, syncope, light-headedness, numbness and headaches.    all other systems are negative except as noted in the HPI and PMH.    Physical Exam Updated Vital Signs BP 103/63   Pulse 79   Temp 98.3 F (36.8 C) (Oral)   Resp 14   SpO2 93%   Physical Exam  Constitutional: He is oriented to person, place, and time. He appears well-developed and well-nourished. No distress.  HENT:  Head: Normocephalic and atraumatic.  Mouth/Throat: Oropharynx is clear and moist. No oropharyngeal exudate.  Healing craniotomy incision  Eyes: Pupils are equal, round, and reactive to light. Conjunctivae and EOM are normal.  Neck: Normal range of motion. Neck supple.  No meningismus.  Cardiovascular: Normal rate, regular rhythm, normal heart sounds and intact distal pulses.  No murmur heard. Pulmonary/Chest: Effort normal and breath sounds normal. No respiratory distress. He exhibits no tenderness.  Abdominal: Soft. There is no tenderness. There is no rebound and no guarding.  Musculoskeletal: Normal range of motion. He exhibits no edema or tenderness.  5/5 strength throughout, moving all extremities CN 2-12 intact. No ataxia on finger to nose bilaterally, no pronator drift.  Neurological: He is alert and oriented to person, place, and time. No cranial nerve deficit. He exhibits normal muscle tone. Coordination normal.  No ataxia on finger to nose bilaterally. No pronator drift. 5/5 strength throughout. CN 2-12 intact.Equal grip strength. Sensation intact.   Skin: Skin is warm.  Psychiatric: He has a normal mood and affect. His behavior is normal.  Nursing note and vitals reviewed.    ED Treatments / Results  Labs (all labs ordered are listed,  but only abnormal results are displayed) Labs Reviewed  COMPREHENSIVE METABOLIC PANEL - Abnormal; Notable for the following components:      Result Value   Chloride 98 (*)    Glucose, Bld 102 (*)    BUN 24 (*)    Calcium 8.3 (*)    Total Protein 5.3 (*)    Albumin 2.6 (*)    All other components within normal limits  CBC WITH DIFFERENTIAL/PLATELET - Abnormal; Notable for the following components:   RBC 3.74 (*)    Hemoglobin 11.9 (*)    HCT 36.4 (*)    Platelets 98 (*)    All other components within normal limits  URINALYSIS, ROUTINE W REFLEX MICROSCOPIC - Abnormal; Notable for the following components:   APPearance CLOUDY (*)    Hgb urine dipstick MODERATE (*)    Protein, ur 100 (*)    Leukocytes, UA LARGE (*)    Bacteria, UA MANY (*)    All other components within normal limits  ACETAMINOPHEN LEVEL - Abnormal; Notable for  the following components:   Acetaminophen (Tylenol), Serum <10 (*)    All other components within normal limits  CULTURE, BLOOD (ROUTINE X 2)  CULTURE, BLOOD (ROUTINE X 2)  URINE CULTURE  RAPID URINE DRUG SCREEN, HOSP PERFORMED  SALICYLATE LEVEL  ETHANOL  PROTIME-INR  INFLUENZA PANEL BY PCR (TYPE A & B)  I-STAT CG4 LACTIC ACID, ED  I-STAT CG4 LACTIC ACID, ED  I-STAT CG4 LACTIC ACID, ED    EKG None  Radiology Dg Chest 2 View  Result Date: 10/25/2017 CLINICAL DATA:  Altered mental status EXAM: CHEST - 2 VIEW COMPARISON:  09/25/2017 FINDINGS: Cardiac shadow is within normal limits. The lungs are well aerated bilaterally. Very minimal left basilar atelectasis is seen. No bony abnormality is noted. IMPRESSION: Minimal left basilar atelectasis. Electronically Signed   By: Inez Catalina M.D.   On: 10/25/2017 06:51   Ct Head Wo Contrast  Result Date: 10/25/2017 CLINICAL DATA:  Status post glioblastoma resection with difficulty speaking EXAM: CT HEAD WITHOUT CONTRAST TECHNIQUE: Contiguous axial images were obtained from the base of the skull through the  vertex without intravenous contrast. COMPARISON:  09/27/2017, 10/02/2017 FINDINGS: Brain: Postsurgical changes are noted in the left frontal lobe consistent with the recent glioblastoma resection. Some residual centrally necrotic neoplasm remains measuring approximately 2.3 x 2.3 cm. The overall appearance is stable when compared with the prior MRI. No findings to suggest acute hemorrhage or acute infarct are noted. Mild residual edematous changes are seen. No significant midline shift is noted. Vascular: No hyperdense vessel or unexpected calcification. Skull: Postsurgical changes in the left frontal region. Sinuses/Orbits: No acute finding. Other: None. IMPRESSION: Significant reduction in size in left frontal neoplasm when compared with the preoperative exam. The overall appearance is stable when compared with the recent MRI. No acute abnormality is noted. Electronically Signed   By: Inez Catalina M.D.   On: 10/25/2017 06:50    Procedures Procedures (including critical care time)  Medications Ordered in ED Medications  acetaminophen (TYLENOL) tablet 650 mg (650 mg Oral Given 10/25/17 0138)     Initial Impression / Assessment and Plan / ED Course  I have reviewed the triage vital signs and the nursing notes.  Pertinent labs & imaging results that were available during my care of the patient were reviewed by me and considered in my medical decision making (see chart for details).    Patient with history of glioblastoma status post resection and seizure disorder presenting with fever and shaking chills at home.  He denies having a seizure tonight.  Denies any other infectious symptoms.  Infectious workup pursued given febrile illness on arrival.  Patient denies any other infectious symptoms.  He has a recent craniotomy but no signs of meningitis.  He denies any other symptoms at this time.  Labs show normal white blood cell count.  Urinalysis positive for infection.  Blood and urine culture sent  and IV Rocephin started.  Patient hydrated.  Given his immunocompromise state as well as recent surgery plan observation admission for hydration and IV antibiotics.  Discussed with Dr. Florene Glen. Final Clinical Impressions(s) / ED Diagnoses   Final diagnoses:  Rigors  Urinary tract infection without hematuria, site unspecified    ED Discharge Orders    None       Terrion Poblano, Annie Main, MD 10/25/17 [phone]

## 2017-10-25 NOTE — ED Triage Notes (Signed)
PT BIB [facility]. Pt was having help walking to the bathroom and he then tensed up and was able to make his way to the bedroom and to the bed and then he went into a clonic stated but would still track people, was unable to communicate. Pt was not post-ictal when EMS arrived. Pt is slightly confused but that is his baseline. Pt has hx of a brain tumor that was operated on. Pt has hx of seizures.

## 2017-10-25 NOTE — ED Notes (Signed)
Pt stated he has no complaints and feels "like himself."

## 2017-10-25 NOTE — H&P (Signed)
History and Physical    Christopher Morris MWU:[id number] DOB: 1941/04/21 DOA: 10/25/2017  PCP: Patient, No Pcp Per  Patient coming from: home  I have personally briefly reviewed patient's old medical records in [place]  Chief Complaint: rigors, fevers  HPI: Christopher Morris is Christopher Morris 77 y.o. male with medical history significant of glioblastoma status post left frontal craniotomy and resection of brain tumor on 3/18 on temodar, history of seizures on Keppra presented with chills and rigors likely due to Fatemah Pourciau urinary tract infection.  He notes that around 1 AM he went to the bathroom.  On his way out he noticed that he started trembling.  His grandson called 90 and brought him here.  He notes dysuria that started Winfield Caba few days ago as well as frequency over the past few days as well.  He denies other infectious symptoms.  He notes that this did not seem like Dayona Shaheen seizure.  He denies cough, cold, chest pain, shortness of breath, abdominal pain, nausea, vomiting, diarrhea, numbness tingling weakness, headaches or neck stiffness.  ED Course: In the ED he had imaging, labs, urinalysis and urine culture, IV antibiotics.  Call for admission due to UTI and immunocompromise.  Review of Systems: As per HPI otherwise 10 point review of systems negative.   Past Medical History:  Diagnosis Date  . Glioblastoma ([place]) 09/25/2017  . Hyperglycemia 09/25/2017  . Seizure ([place]) 09/25/2017    Past Surgical History:  Procedure Laterality Date  . APPLICATION OF CRANIAL NAVIGATION N/Euva Rundell 09/30/2017   Procedure: APPLICATION OF CRANIAL NAVIGATION;  Surgeon: [name], MD;  Location: [facility];  Service: Neurosurgery;  Laterality: N/Benuel Ly;  . CRANIOTOMY N/Myalynn Lingle 09/30/2017   Procedure: CRANIOTOMY FOR RESECTION OF BRAIN TUMOR WITH STEREOTACTIC;  Surgeon: [name], MD;  Location: [facility];  Service: Neurosurgery;  Laterality: N/Anquinette Pierro;  CRANIOTOMY FOR RESECTION OF BRAIN TUMOR WITH STEREOTACTIC     reports that he has never smoked. He  has never used smokeless tobacco. He reports that he does not drink alcohol or use drugs.  Allergies  Allergen Reactions  . Pork-Derived Products     Patient does not eat pork  . Shellfish Allergy     Patient does not eat shellfish    Family History  Problem Relation Age of Onset  . Hypertension Daughter   . Stroke Mother   . Cancer Neg Hx    Prior to Admission medications   Medication Sig Start Date End Date Taking? Authorizing Provider  acetaminophen (TYLENOL) 325 MG tablet Take 2 tablets (650 mg total) by mouth every 6 (six) hours as needed for mild pain (or Fever >/= 101). Patient not taking: Reported on 10/25/2017 10/04/17   Velvet Bathe, MD  dexamethasone (DECADRON) 4 MG tablet Take 1 tablet (4 mg total) by mouth 2 (two) times daily. 10/17/17   Ventura Sellers, MD  levETIRAcetam (KEPPRA) 500 MG tablet Take 1 tablet (500 mg total) by mouth 2 (two) times daily. 10/17/17   Ventura Sellers, MD  temozolomide (TEMODAR) 140 MG capsule Take 1 capsule (140mg ) by mouth once daily. Take with 20mg  capsule for 160mg  total daily dose. May take on an empty stomach to decrease N&V 10/24/17   Vaslow, Acey Lav, MD  temozolomide (TEMODAR) 20 MG capsule Take 1 capsule (20mg ) by mouth once daily. Take with 140mg  capsule for 160mg  total daily dose. May take on an empty stomach to decrease N&V 10/24/17   Vaslow, Acey Lav, MD    Physical Exam: Vitals:  10/25/17 0815 10/25/17 0830 10/25/17 0845 10/25/17 0900  BP: 116/68 111/74 119/82 116/73  Pulse: 63 69 64 65  Resp: 15 15 16 15   Temp:      TempSrc:      SpO2: 94% 95% 98% 96%    Constitutional: NAD, calm, comfortable Vitals:   10/25/17 0815 10/25/17 0830 10/25/17 0845 10/25/17 0900  BP: 116/68 111/74 119/82 116/73  Pulse: 63 69 64 65  Resp: 15 15 16 15   Temp:      TempSrc:      SpO2: 94% 95% 98% 96%   Eyes: PERRL, lids and conjunctivae normal ENMT: Mucous membranes are moist. Posterior pharynx clear of any exudate or lesions.Normal  dentition.  Neck: normal, supple, no masses, no thyromegaly Respiratory: clear to auscultation bilaterally, no wheezing, no crackles. Normal respiratory effort. No accessory muscle use.  Cardiovascular: Regular rate and rhythm, no murmurs / rubs / gallops. No extremity edema. 2+ pedal pulses. No carotid bruits.  Abdomen: no tenderness, no masses palpated. No hepatosplenomegaly. Bowel sounds positive.  Musculoskeletal: no clubbing / cyanosis. No joint deformity upper and lower extremities. Good ROM, no contractures. Normal muscle tone.  Skin: no rashes, lesions, ulcers. No induration Neurologic: CN 2-12 intact. Sensation intact. Strength 5/5 in all 4.  No meningismus. Psychiatric: Normal judgment and insight. Alert and oriented x 3. Normal mood.   Labs on Admission: I have personally reviewed following labs and imaging studies  CBC: Recent Labs  Lab 10/25/17 0231  WBC 7.4  NEUTROABS 6.2  HGB 11.9*  HCT 36.4*  MCV 97.3  PLT 98*   Basic Metabolic Panel: Recent Labs  Lab 10/25/17 0231  NA 136  K 4.4  CL 98*  CO2 28  GLUCOSE 102*  BUN 24*  CREATININE 0.94  CALCIUM 8.3*   GFR: Estimated Creatinine Clearance: 78.1 mL/min (by C-G formula based on SCr of 0.94 mg/dL). Liver Function Tests: Recent Labs  Lab 10/25/17 0231  AST 23  ALT 43  ALKPHOS 81  BILITOT 1.0  PROT 5.3*  ALBUMIN 2.6*   No results for input(s): LIPASE, AMYLASE in the last 168 hours. No results for input(s): AMMONIA in the last 168 hours. Coagulation Profile: Recent Labs  Lab 10/25/17 0508  INR 0.99   Cardiac Enzymes: No results for input(s): CKTOTAL, CKMB, CKMBINDEX, TROPONINI in the last 168 hours. BNP (last 3 results) No results for input(s): PROBNP in the last 8760 hours. HbA1C: No results for input(s): HGBA1C in the last 72 hours. CBG: No results for input(s): GLUCAP in the last 168 hours. Lipid Profile: No results for input(s): CHOL, HDL, LDLCALC, TRIG, CHOLHDL, LDLDIRECT in the last 72  hours. Thyroid Function Tests: No results for input(s): TSH, T4TOTAL, FREET4, T3FREE, THYROIDAB in the last 72 hours. Anemia Panel: No results for input(s): VITAMINB12, FOLATE, FERRITIN, TIBC, IRON, RETICCTPCT in the last 72 hours. Urine analysis:    Component Value Date/Time   COLORURINE YELLOW 10/25/2017 0454   APPEARANCEUR CLOUDY (Danie Diehl) 10/25/2017 0454   LABSPEC 1.015 10/25/2017 0454   PHURINE 6.0 10/25/2017 0454   GLUCOSEU NEGATIVE 10/25/2017 0454   HGBUR MODERATE (Denell Cothern) 10/25/2017 0454   BILIRUBINUR NEGATIVE 10/25/2017 0454   KETONESUR NEGATIVE 10/25/2017 0454   PROTEINUR 100 (Xzandria Clevinger) 10/25/2017 0454   NITRITE NEGATIVE 10/25/2017 0454   LEUKOCYTESUR LARGE (Alvenia Treese) 10/25/2017 0454    Radiological Exams on Admission: Dg Chest 2 View  Result Date: 10/25/2017 CLINICAL DATA:  Altered mental status EXAM: CHEST - 2 VIEW COMPARISON:  09/25/2017 FINDINGS: Cardiac shadow  is within normal limits. The lungs are well aerated bilaterally. Very minimal left basilar atelectasis is seen. No bony abnormality is noted. IMPRESSION: Minimal left basilar atelectasis. Electronically Signed   By: Inez Catalina M.D.   On: 10/25/2017 06:51   Ct Head Wo Contrast  Result Date: 10/25/2017 CLINICAL DATA:  Status post glioblastoma resection with difficulty speaking EXAM: CT HEAD WITHOUT CONTRAST TECHNIQUE: Contiguous axial images were obtained from the base of the skull through the vertex without intravenous contrast. COMPARISON:  09/27/2017, 10/02/2017 FINDINGS: Brain: Postsurgical changes are noted in the left frontal lobe consistent with the recent glioblastoma resection. Some residual centrally necrotic neoplasm remains measuring approximately 2.3 x 2.3 cm. The overall appearance is stable when compared with the prior MRI. No findings to suggest acute hemorrhage or acute infarct are noted. Mild residual edematous changes are seen. No significant midline shift is noted. Vascular: No hyperdense vessel or unexpected  calcification. Skull: Postsurgical changes in the left frontal region. Sinuses/Orbits: No acute finding. Other: None. IMPRESSION: Significant reduction in size in left frontal neoplasm when compared with the preoperative exam. The overall appearance is stable when compared with the recent MRI. No acute abnormality is noted. Electronically Signed   By: Inez Catalina M.D.   On: 10/25/2017 06:50    EKG: Independently reviewed. none  Assessment/Plan Active Problems:   UTI (urinary tract infection)  Fever  Rigors  Urinary Tract Infection  Sepsis:  Meets SIRS sepsis criteria with bands on diff and fever.  Neg qsofa.  With UA c/w UTI and UTI symptoms and fever and rigors.  Renal compromise with chronic steroids and chemotherapy.  No history of UTI or prior cultures.  Hemodynamically stable.  CXR with L basilar atelectasis. Follow urine and blood cultures Continue ceftriaxone Will double steroid dose Continue IVF Negative serial lactate  Glioblastoma status post left frontal craniotomy and resection on 3/18  Seizures:  Follows with Dr. Mickeal Skinner (discussed admission with Dr. Mickeal Skinner, and agreed to hold temodar, can resume after he follows up in clinic) Hold temodar Double dexamethasone as noted above Head CT in ED without acute abnormality Continue keppra  Concern for Steroid Induced Hyperglycemia: will start SSI while increasing steroids.    Thrombocytopenia:  continue to monitor, new since last labs  DVT prophylaxis: SCD  Code Status: full  Family Communication: none at bedside  Disposition Plan: pending culture results and improvement  Consults called: Dr. Mickeal Skinner informed of admission  Admission status: obs    Fayrene Helper MD [facility] Pager [phone]  If 7PM-7AM, please contact night-coverage [email or web address] Password [facility]  10/25/2017, 9:21 AM

## 2017-10-25 NOTE — Progress Notes (Addendum)
PHARMACY - PHYSICIAN COMMUNICATION CRITICAL VALUE ALERT - BLOOD CULTURE IDENTIFICATION (BCID)  Christopher Morris is an 77 y.o. male who presented to [facility] on 10/25/2017 with a chief complaint of chills and rigors  Assessment:  Christopher Morris is a 77 y.o. male with medical history significant of glioblastoma status post left frontal craniotomy and resection of brain tumor on 3/18 on temodar, history of seizures on Keppra presented with chills and rigors likely due to a urinary tract infection.  Name of physician (or Provider) Contacted: X. Blount  Current antibiotics: Rocephin 1 gm q24  Changes to prescribed antibiotics recommended: Increase Rocephin to 2 gm q24, extra 1 gm dose now  Recommendations accepted by provider  Results for orders placed or performed during the hospital encounter of 10/25/17  Blood Culture ID Panel (Reflexed) (Collected: 10/25/2017  5:08 AM)  Result Value Ref Range   Enterococcus species NOT DETECTED NOT DETECTED   Listeria monocytogenes NOT DETECTED NOT DETECTED   Staphylococcus species NOT DETECTED NOT DETECTED   Staphylococcus aureus NOT DETECTED NOT DETECTED   Streptococcus species NOT DETECTED NOT DETECTED   Streptococcus agalactiae NOT DETECTED NOT DETECTED   Streptococcus pneumoniae NOT DETECTED NOT DETECTED   Streptococcus pyogenes NOT DETECTED NOT DETECTED   Acinetobacter baumannii NOT DETECTED NOT DETECTED   Enterobacteriaceae species DETECTED (A) NOT DETECTED   Enterobacter cloacae complex NOT DETECTED NOT DETECTED   Escherichia coli DETECTED (A) NOT DETECTED   Klebsiella oxytoca NOT DETECTED NOT DETECTED   Klebsiella pneumoniae NOT DETECTED NOT DETECTED   Proteus species NOT DETECTED NOT DETECTED   Serratia marcescens NOT DETECTED NOT DETECTED   Carbapenem resistance NOT DETECTED NOT DETECTED   Haemophilus influenzae NOT DETECTED NOT DETECTED   Neisseria meningitidis NOT DETECTED NOT DETECTED   Pseudomonas aeruginosa NOT DETECTED NOT DETECTED    Candida albicans NOT DETECTED NOT DETECTED   Candida glabrata NOT DETECTED NOT DETECTED   Candida krusei NOT DETECTED NOT DETECTED   Candida parapsilosis NOT DETECTED NOT DETECTED   Candida tropicalis NOT DETECTED NOT DETECTED    Eudelia Bunch, Pharm.D. [phone] 10/25/2017 8:29 PM

## 2017-10-25 NOTE — ED Notes (Signed)
Urine culture tube sent with urine sample to save in lab

## 2017-10-25 NOTE — Progress Notes (Signed)
Called [facility] for seizure pads and none are available at this time

## 2017-10-25 NOTE — ED Notes (Signed)
Pt given urinal to provide urine sample

## 2017-10-26 DIAGNOSIS — R6520 Severe sepsis without septic shock: Secondary | ICD-10-CM | POA: Diagnosis present

## 2017-10-26 DIAGNOSIS — Z7952 Long term (current) use of systemic steroids: Secondary | ICD-10-CM | POA: Diagnosis not present

## 2017-10-26 DIAGNOSIS — R739 Hyperglycemia, unspecified: Secondary | ICD-10-CM | POA: Diagnosis not present

## 2017-10-26 DIAGNOSIS — A4151 Sepsis due to Escherichia coli [E. coli]: Secondary | ICD-10-CM | POA: Diagnosis not present

## 2017-10-26 DIAGNOSIS — R569 Unspecified convulsions: Secondary | ICD-10-CM | POA: Diagnosis not present

## 2017-10-26 DIAGNOSIS — G40909 Epilepsy, unspecified, not intractable, without status epilepticus: Secondary | ICD-10-CM | POA: Diagnosis present

## 2017-10-26 DIAGNOSIS — C719 Malignant neoplasm of brain, unspecified: Secondary | ICD-10-CM | POA: Diagnosis not present

## 2017-10-26 DIAGNOSIS — Z79899 Other long term (current) drug therapy: Secondary | ICD-10-CM | POA: Diagnosis not present

## 2017-10-26 DIAGNOSIS — R7881 Bacteremia: Secondary | ICD-10-CM | POA: Diagnosis not present

## 2017-10-26 DIAGNOSIS — Z9889 Other specified postprocedural states: Secondary | ICD-10-CM | POA: Diagnosis not present

## 2017-10-26 DIAGNOSIS — D696 Thrombocytopenia, unspecified: Secondary | ICD-10-CM | POA: Diagnosis present

## 2017-10-26 DIAGNOSIS — Z823 Family history of stroke: Secondary | ICD-10-CM | POA: Diagnosis not present

## 2017-10-26 DIAGNOSIS — Z8249 Family history of ischemic heart disease and other diseases of the circulatory system: Secondary | ICD-10-CM | POA: Diagnosis not present

## 2017-10-26 DIAGNOSIS — Z91018 Allergy to other foods: Secondary | ICD-10-CM | POA: Diagnosis not present

## 2017-10-26 DIAGNOSIS — N390 Urinary tract infection, site not specified: Secondary | ICD-10-CM

## 2017-10-26 DIAGNOSIS — C711 Malignant neoplasm of frontal lobe: Secondary | ICD-10-CM | POA: Diagnosis present

## 2017-10-26 DIAGNOSIS — D638 Anemia in other chronic diseases classified elsewhere: Secondary | ICD-10-CM | POA: Diagnosis present

## 2017-10-26 DIAGNOSIS — Z91013 Allergy to seafood: Secondary | ICD-10-CM | POA: Diagnosis not present

## 2017-10-26 DIAGNOSIS — R509 Fever, unspecified: Secondary | ICD-10-CM | POA: Diagnosis not present

## 2017-10-26 DIAGNOSIS — T380X5A Adverse effect of glucocorticoids and synthetic analogues, initial encounter: Secondary | ICD-10-CM | POA: Diagnosis present

## 2017-10-26 LAB — COMPREHENSIVE METABOLIC PANEL
ALT: 44 U/L (ref 17–63)
AST: 19 U/L (ref 15–41)
Albumin: 2.1 g/dL — ABNORMAL LOW (ref 3.5–5.0)
Alkaline Phosphatase: 72 U/L (ref 38–126)
Anion gap: 10 (ref 5–15)
BUN: 19 mg/dL (ref 6–20)
CHLORIDE: 102 mmol/L (ref 101–111)
CO2: 25 mmol/L (ref 22–32)
CREATININE: 0.76 mg/dL (ref 0.61–1.24)
Calcium: 8.4 mg/dL — ABNORMAL LOW (ref 8.9–10.3)
GFR calc Af Amer: >60 mL/min (ref >60)
GFR calc non Af Amer: >60 mL/min (ref >60)
Glucose, Bld: 248 mg/dL — ABNORMAL HIGH (ref 65–99)
POTASSIUM: 4.6 mmol/L (ref 3.5–5.1)
SODIUM: 137 mmol/L (ref 135–145)
Total Bilirubin: 0.6 mg/dL (ref 0.3–1.2)
Total Protein: 5.1 g/dL — ABNORMAL LOW (ref 6.5–8.1)

## 2017-10-26 LAB — GLUCOSE, CAPILLARY
GLUCOSE-CAPILLARY: 198 mg/dL — AB (ref 65–99)
GLUCOSE-CAPILLARY: 209 mg/dL — AB (ref 65–99)
GLUCOSE-CAPILLARY: 246 mg/dL — AB (ref 65–99)
Glucose-Capillary: 246 mg/dL — ABNORMAL HIGH (ref 65–99)

## 2017-10-26 LAB — CBC
HEMATOCRIT: 33.3 % — AB (ref 39.0–52.0)
Hemoglobin: 11.4 g/dL — ABNORMAL LOW (ref 13.0–17.0)
MCH: 32.9 pg (ref 26.0–34.0)
MCHC: 34.2 g/dL (ref 30.0–36.0)
MCV: 96 fL (ref 78.0–100.0)
Platelets: 78 10*3/uL — ABNORMAL LOW (ref 150–400)
RBC: 3.47 MIL/uL — AB (ref 4.22–5.81)
RDW: 13.3 % (ref 11.5–15.5)
WBC: 7.4 10*3/uL (ref 4.0–10.5)

## 2017-10-26 NOTE — Progress Notes (Signed)
PROGRESS NOTE    Christopher Morris  DJS:[id number] DOB: 07-07-1941 DOA: 10/25/2017 PCP: Patient, No Pcp Per    Brief Narrative: Christopher Morris is a 77 y.o. male with medical history significant of glioblastoma status post left frontal craniotomy and resection of brain tumor on 3/18 on temodar, history of seizures on Keppra presented with chills and rigors likely due to a urinary tract infection.    Assessment & Plan:   Active Problems:   UTI (urinary tract infection)  Gram-negative bacteremia probably secondary to urinary tract infection/sepsis from gram-negative bacteria Admitted for IV antibiotics Follow-up urine and blood cultures. , Resume fluids and trend lactate.   History of glioblastoma status post left frontal craniotomy and resection on the March 18 Seizures Follows up with Dr. Mickeal Skinner. Resume Decadron. CT head without contrast without acute abnormality. Seizure precautions Resume Keppra   Steroid-induced hyperglycemia Get hemoglobin A1c, resume SSI.   Anemia of chronic disease Hemoglobin stable around 11.    DVT prophylaxis: SCDs Code Status: Full code Family Communication: None at bedside  disposition Plan: Pending resolution of sepsis  Consultants:   None.    Procedures: none.   Antimicrobials: Rocephin   Subjective: No new complaints  Objective: Vitals:   10/25/17 1618 10/25/17 2007 10/26/17 0511 10/26/17 1539  BP:  121/74 124/65 137/86  Pulse:  84 64 61  Resp:  18 18 17   Temp:  99.2 F (37.3 C) 97.7 F (36.5 C) 97.7 F (36.5 C)  TempSrc:  Oral Oral Oral  SpO2:  97% 97% 96%  Weight: 87.5 kg (192 lb 14.4 oz)     Height: 6\' 4"  (1.93 m)       Intake/Output Summary (Last 24 hours) at 10/26/2017 1717 Last data filed at 10/26/2017 1500 Gross per 24 hour  Intake 3642.09 ml  Output 2050 ml  Net 1592.09 ml   Filed Weights   10/25/17 1618  Weight: 87.5 kg (192 lb 14.4 oz)    Examination:  General exam: Appears calm and comfortable    Respiratory system: Clear to auscultation. Respiratory effort normal. Cardiovascular system: S1 & S2 heard, RRR. No JVD, murmurs, rubs, gallops or clicks. No pedal edema. Gastrointestinal system: Abdomen is nondistended, soft and nontender. No organomegaly or masses felt. Normal bowel sounds heard. Central nervous system: Alert and oriented. No focal neurological deficits. Extremities: Symmetric 5 x 5 power. Skin: No rashes, lesions or ulcers Psychiatry: Mood & affect appropriate.     Data Reviewed: I have personally reviewed following labs and imaging studies  CBC: Recent Labs  Lab 10/25/17 0231 10/26/17 0434  WBC 7.4 7.4  NEUTROABS 6.2  --   HGB 11.9* 11.4*  HCT 36.4* 33.3*  MCV 97.3 96.0  PLT 98* 78*   Basic Metabolic Panel: Recent Labs  Lab 10/25/17 0231 10/26/17 0434  NA 136 137  K 4.4 4.6  CL 98* 102  CO2 28 25  GLUCOSE 102* 248*  BUN 24* 19  CREATININE 0.94 0.76  CALCIUM 8.3* 8.4*   GFR: Estimated Creatinine Clearance: 91.9 mL/min (by C-G formula based on SCr of 0.76 mg/dL). Liver Function Tests: Recent Labs  Lab 10/25/17 0231 10/26/17 0434  AST 23 19  ALT 43 44  ALKPHOS 81 72  BILITOT 1.0 0.6  PROT 5.3* 5.1*  ALBUMIN 2.6* 2.1*   No results for input(s): LIPASE, AMYLASE in the last 168 hours. No results for input(s): AMMONIA in the last 168 hours. Coagulation Profile: Recent Labs  Lab 10/25/17 0508  INR 0.99  Cardiac Enzymes: No results for input(s): CKTOTAL, CKMB, CKMBINDEX, TROPONINI in the last 168 hours. BNP (last 3 results) No results for input(s): PROBNP in the last 8760 hours. HbA1C: No results for input(s): HGBA1C in the last 72 hours. CBG: Recent Labs  Lab 10/25/17 1708 10/25/17 2016 10/26/17 0803 10/26/17 1105 10/26/17 1619  GLUCAP 175* 249* 198* 246* 209*   Lipid Profile: No results for input(s): CHOL, HDL, LDLCALC, TRIG, CHOLHDL, LDLDIRECT in the last 72 hours. Thyroid Function Tests: No results for input(s): TSH,  T4TOTAL, FREET4, T3FREE, THYROIDAB in the last 72 hours. Anemia Panel: No results for input(s): VITAMINB12, FOLATE, FERRITIN, TIBC, IRON, RETICCTPCT in the last 72 hours. Sepsis Labs: Recent Labs  Lab 10/25/17 0246 10/25/17 0514  LATICACIDVEN 1.85 1.72    Recent Results (from the past 240 hour(s))  Urine Culture     Status: Abnormal (Preliminary result)   Collection Time: 10/25/17  4:54 AM  Result Value Ref Range Status   Specimen Description   Final    URINE, CLEAN CATCH Performed at [facility], [place] [address], [place], [place] [address]    Special Requests   Final    NONE Performed at [facility], [place] [address] [place], [place] [address]    Culture >=100,000 COLONIES/mL ESCHERICHIA COLI (A)  Final   Report Status PENDING  Incomplete  Blood culture (routine x 2)     Status: None (Preliminary result)   Collection Time: 10/25/17  5:08 AM  Result Value Ref Range Status   Specimen Description   Final    BLOOD RIGHT HAND Performed at [place] [address], [place], [place] [address]    Special Requests   Final    BOTTLES DRAWN AEROBIC AND ANAEROBIC Blood Culture results may not be optimal due to an inadequate volume of blood received in culture bottles Performed at [place] [address] [place], [place] [address]    Culture  Setup Time   Final    GRAM NEGATIVE RODS IN BOTH AEROBIC AND ANAEROBIC BOTTLES CRITICAL RESULT CALLED TO, READ BACK BY AND VERIFIED WITH: MBELL,PHARMD @2023  10/25/17 BY LHOWARD Performed at [facility], [address] [place], [place] [address]    Culture GRAM NEGATIVE RODS  Final   Report Status PENDING  Incomplete  Blood culture (routine x 2)     Status: Abnormal (Preliminary result)   Collection Time: 10/25/17  5:08 AM  Result Value Ref Range Status   Specimen Description   Final    BLOOD RIGHT ANTECUBITAL Performed at [place] [address], [place], [place] [address]    Special Requests   Final    BOTTLES DRAWN AEROBIC AND ANAEROBIC Blood Culture results may not be optimal due to an inadequate volume of blood received in culture bottles Performed at [place] [address], [place], [place] [address]    Culture  Setup Time   Final    GRAM NEGATIVE RODS IN BOTH AEROBIC AND ANAEROBIC BOTTLES CRITICAL VALUE NOTED.  VALUE IS CONSISTENT WITH PREVIOUSLY REPORTED AND CALLED VALUE. Performed at [facility], [place] [address], [place], [place] [address]    Culture ESCHERICHIA COLI (A)  Final   Report Status PENDING  Incomplete  Blood Culture ID Panel (Reflexed)     Status: Abnormal   Collection Time: 10/25/17  5:08 AM  Result Value Ref Range Status   Enterococcus species NOT DETECTED NOT  DETECTED Final   Listeria monocytogenes NOT DETECTED NOT DETECTED Final   Staphylococcus species NOT DETECTED NOT DETECTED Final   Staphylococcus aureus NOT DETECTED NOT DETECTED Final   Streptococcus species NOT DETECTED NOT DETECTED Final   Streptococcus agalactiae NOT DETECTED NOT DETECTED Final   Streptococcus pneumoniae NOT DETECTED NOT DETECTED Final   Streptococcus pyogenes NOT DETECTED NOT DETECTED Final   Acinetobacter baumannii NOT DETECTED NOT DETECTED Final   Enterobacteriaceae species DETECTED (A) NOT DETECTED Final    Comment: Enterobacteriaceae represent a large family of gram-negative bacteria, not a single organism. CRITICAL RESULT CALLED TO, READ BACK BY AND VERIFIED WITH: MBELL,PHARMD @2023  10/25/17 BY LHOWARD    Enterobacter cloacae complex NOT DETECTED NOT DETECTED Final   Escherichia coli DETECTED (A) NOT DETECTED Final    Comment: CRITICAL RESULT CALLED TO, READ BACK BY AND VERIFIED WITH: MBELL,PHARMD @2023  10/25/17 BY LHOWARD    Klebsiella oxytoca NOT DETECTED NOT DETECTED Final   Klebsiella pneumoniae NOT DETECTED NOT DETECTED Final   Proteus species NOT  DETECTED NOT DETECTED Final   Serratia marcescens NOT DETECTED NOT DETECTED Final   Carbapenem resistance NOT DETECTED NOT DETECTED Final   Haemophilus influenzae NOT DETECTED NOT DETECTED Final   Neisseria meningitidis NOT DETECTED NOT DETECTED Final   Pseudomonas aeruginosa NOT DETECTED NOT DETECTED Final   Candida albicans NOT DETECTED NOT DETECTED Final   Candida glabrata NOT DETECTED NOT DETECTED Final   Candida krusei NOT DETECTED NOT DETECTED Final   Candida parapsilosis NOT DETECTED NOT DETECTED Final   Candida tropicalis NOT DETECTED NOT DETECTED Final    Comment: Performed at [facility], [place] [address], [place], [place] [address]         Radiology Studies: Dg Chest 2 View  Result Date: 10/25/2017 CLINICAL DATA:  Altered mental status EXAM: CHEST - 2 VIEW COMPARISON:  09/25/2017 FINDINGS: Cardiac shadow is within normal limits. The lungs are well aerated bilaterally. Very minimal left basilar atelectasis is seen. No bony abnormality is noted. IMPRESSION: Minimal left basilar atelectasis. Electronically Signed   By: Inez Catalina M.D.   On: 10/25/2017 06:51   Ct Head Wo Contrast  Result Date: 10/25/2017 CLINICAL DATA:  Status post glioblastoma resection with difficulty speaking EXAM: CT HEAD WITHOUT CONTRAST TECHNIQUE: Contiguous axial images were obtained from the base of the skull through the vertex without intravenous contrast. COMPARISON:  09/27/2017, 10/02/2017 FINDINGS: Brain: Postsurgical changes are noted in the left frontal lobe consistent with the recent glioblastoma resection. Some residual centrally necrotic neoplasm remains measuring approximately 2.3 x 2.3 cm. The overall appearance is stable when compared with the prior MRI. No findings to suggest acute hemorrhage or acute infarct are noted. Mild residual edematous changes are seen. No significant midline shift is noted. Vascular: No hyperdense vessel or unexpected calcification. Skull: Postsurgical changes in  the left frontal region. Sinuses/Orbits: No acute finding. Other: None. IMPRESSION: Significant reduction in size in left frontal neoplasm when compared with the preoperative exam. The overall appearance is stable when compared with the recent MRI. No acute abnormality is noted. Electronically Signed   By: Inez Catalina M.D.   On: 10/25/2017 06:50        Scheduled Meds: . dexamethasone  4 mg Oral Q6H  . insulin aspart  0-5 Units Subcutaneous QHS  . insulin aspart  0-9 Units Subcutaneous TID WC  . levETIRAcetam  500 mg Oral BID   Continuous Infusions: . cefTRIAXone (ROCEPHIN)  IV Stopped (10/26/17 0958)  . lactated  ringers 125 mL/hr at 10/25/17 1719     LOS: 0 days    Time spent: 35 minutes.     Hosie Poisson, MD [facility] Pager [phone]  If 7PM-7AM, please contact night-coverage [email or web address] Password [facility] 10/26/2017, 5:17 PM

## 2017-10-27 DIAGNOSIS — R7881 Bacteremia: Secondary | ICD-10-CM

## 2017-10-27 DIAGNOSIS — A4151 Sepsis due to Escherichia coli [E. coli]: Principal | ICD-10-CM

## 2017-10-27 LAB — CBC
HCT: 35 % — ABNORMAL LOW (ref 39.0–52.0)
HEMOGLOBIN: 11.7 g/dL — AB (ref 13.0–17.0)
MCH: 32.4 pg (ref 26.0–34.0)
MCHC: 33.4 g/dL (ref 30.0–36.0)
MCV: 97 fL (ref 78.0–100.0)
Platelets: 89 10*3/uL — ABNORMAL LOW (ref 150–400)
RBC: 3.61 MIL/uL — ABNORMAL LOW (ref 4.22–5.81)
RDW: 12.9 % (ref 11.5–15.5)
WBC: 10.6 10*3/uL — ABNORMAL HIGH (ref 4.0–10.5)

## 2017-10-27 LAB — BASIC METABOLIC PANEL
Anion gap: 8 (ref 5–15)
BUN: 20 mg/dL (ref 6–20)
CALCIUM: 8.7 mg/dL — AB (ref 8.9–10.3)
CO2: 28 mmol/L (ref 22–32)
CREATININE: 0.91 mg/dL (ref 0.61–1.24)
Chloride: 100 mmol/L — ABNORMAL LOW (ref 101–111)
GFR calc Af Amer: >60 mL/min (ref >60)
Glucose, Bld: 196 mg/dL — ABNORMAL HIGH (ref 65–99)
Potassium: 5.4 mmol/L — ABNORMAL HIGH (ref 3.5–5.1)
Sodium: 136 mmol/L (ref 135–145)

## 2017-10-27 LAB — URINE CULTURE: Culture: >=100000 — AB

## 2017-10-27 LAB — GLUCOSE, CAPILLARY
GLUCOSE-CAPILLARY: 244 mg/dL — AB (ref 65–99)
GLUCOSE-CAPILLARY: 262 mg/dL — AB (ref 65–99)
Glucose-Capillary: 247 mg/dL — ABNORMAL HIGH (ref 65–99)
Glucose-Capillary: 184 mg/dL — ABNORMAL HIGH (ref 65–99)

## 2017-10-27 LAB — CULTURE, BLOOD (ROUTINE X 2)

## 2017-10-27 MED ORDER — SODIUM CHLORIDE 0.9 % IV SOLN
INTRAVENOUS | Status: DC
  Administered 2017-10-27: 17:00:00 via INTRAVENOUS

## 2017-10-27 NOTE — Progress Notes (Signed)
PROGRESS NOTE    Christopher Morris  ACZ:[id number] DOB: Apr 07, 1941 DOA: 10/25/2017 PCP: Patient, No Pcp Per    Brief Narrative: Christopher Morris is a 77 y.o. male with medical history significant of glioblastoma status post left frontal craniotomy and resection of brain tumor on 3/18 on temodar, history of seizures on Keppra presented with chills and rigors likely due to a urinary tract infection.    Assessment & Plan:   Active Problems:   Seizure ([place])   Glioblastoma ([place])   Hyperglycemia   UTI (urinary tract infection)   Bacteremia due to Gram-negative bacteria   Sepsis due to Escherichia coli (E. coli) (HCC)  Ecoli bacteremia? Ecoli Sepsis from UTI Admitted for IV antibiotics, plan to transition to oral ciprofloxacin on discharge tomorrow after PT evaluation.  Gently hydrate and pt will need at least 2 to 3 weeks of antibiotics.  Repeat blood cultures ordered.    History of glioblastoma status post left frontal craniotomy and resection on the March 18 Seizures Follows up with Dr. Mickeal Skinner. Resume Decadron. CT head without contrast without acute abnormality. Seizure precautions Resume Keppra   Steroid-induced hyperglycemia  hemoglobin A1c is 5.6, resume SSI.   Anemia of chronic disease Hemoglobin stable around 11.    DVT prophylaxis: SCDs Code Status: Full code Family Communication: None at bedside  disposition Plan: pending PT evaluation. Possibly home in am.   Consultants:   None.    Procedures: none.   Antimicrobials: Rocephin   Subjective: No new complaints  Objective: Vitals:   10/26/17 0511 10/26/17 1539 10/26/17 2053 10/27/17 0534  BP: 124/65 137/86 126/73 (!) 149/90  Pulse: 64 61 63 (!) 52  Resp: 18 17 18 18   Temp: 97.7 F (36.5 C) 97.7 F (36.5 C) 98.3 F (36.8 C) 97.6 F (36.4 C)  TempSrc: Oral Oral Oral Oral  SpO2: 97% 96% 96% 98%  Weight:      Height:        Intake/Output Summary (Last 24 hours) at 10/27/2017 1602 Last data filed  at 10/27/2017 1500 Gross per 24 hour  Intake 3340 ml  Output 4650 ml  Net -1310 ml   Filed Weights   10/25/17 1618  Weight: 87.5 kg (192 lb 14.4 oz)    Examination:  General exam: Appears calm and comfortable not in distress.  Respiratory system: Clear to auscultation. Respiratory effort normal. Cardiovascular system: S1 & S2 heard, RRR. No JVD, murmurs, Gastrointestinal system: Abdomen is soft NT ND BS+ Central nervous system: Alert and oriented. No focal neurological deficits. Extremities: Symmetric 5 x 5 power. Skin: No rashes, lesions or ulcers Psychiatry: Mood & affect appropriate.     Data Reviewed: I have personally reviewed following labs and imaging studies  CBC: Recent Labs  Lab 10/25/17 0231 10/26/17 0434 10/27/17 0414  WBC 7.4 7.4 10.6*  NEUTROABS 6.2  --   --   HGB 11.9* 11.4* 11.7*  HCT 36.4* 33.3* 35.0*  MCV 97.3 96.0 97.0  PLT 98* 78* 89*   Basic Metabolic Panel: Recent Labs  Lab 10/25/17 0231 10/26/17 0434 10/27/17 0414  NA 136 137 136  K 4.4 4.6 5.4*  CL 98* 102 100*  CO2 28 25 28   GLUCOSE 102* 248* 196*  BUN 24* 19 20  CREATININE 0.94 0.76 0.91  CALCIUM 8.3* 8.4* 8.7*   GFR: Estimated Creatinine Clearance: 80.8 mL/min (by C-G formula based on SCr of 0.91 mg/dL). Liver Function Tests: Recent Labs  Lab 10/25/17 0231 10/26/17 0434  AST 23 19  ALT  43 44  ALKPHOS 81 72  BILITOT 1.0 0.6  PROT 5.3* 5.1*  ALBUMIN 2.6* 2.1*   No results for input(s): LIPASE, AMYLASE in the last 168 hours. No results for input(s): AMMONIA in the last 168 hours. Coagulation Profile: Recent Labs  Lab 10/25/17 0508  INR 0.99   Cardiac Enzymes: No results for input(s): CKTOTAL, CKMB, CKMBINDEX, TROPONINI in the last 168 hours. BNP (last 3 results) No results for input(s): PROBNP in the last 8760 hours. HbA1C: No results for input(s): HGBA1C in the last 72 hours. CBG: Recent Labs  Lab 10/26/17 1105 10/26/17 1619 10/26/17 2050 10/27/17 0832  10/27/17 1218  GLUCAP 246* 209* 246* 244* 247*   Lipid Profile: No results for input(s): CHOL, HDL, LDLCALC, TRIG, CHOLHDL, LDLDIRECT in the last 72 hours. Thyroid Function Tests: No results for input(s): TSH, T4TOTAL, FREET4, T3FREE, THYROIDAB in the last 72 hours. Anemia Panel: No results for input(s): VITAMINB12, FOLATE, FERRITIN, TIBC, IRON, RETICCTPCT in the last 72 hours. Sepsis Labs: Recent Labs  Lab 10/25/17 0246 10/25/17 0514  LATICACIDVEN 1.85 1.72    Recent Results (from the past 240 hour(s))  Urine Culture     Status: Abnormal   Collection Time: 10/25/17  4:54 AM  Result Value Ref Range Status   Specimen Description   Final    URINE, CLEAN CATCH Performed at [facility], [place] [address] [place], [place] [address]    Special Requests   Final    NONE Performed at [facility], [place] [address], [place], [place] [address]    Culture >=100,000 COLONIES/mL ESCHERICHIA COLI (A)  Final   Report Status 10/27/2017 FINAL  Final   Organism ID, Bacteria ESCHERICHIA COLI (A)  Final      Susceptibility   Escherichia coli - MIC*    AMPICILLIN >=32 RESISTANT Resistant     CEFAZOLIN >=64 RESISTANT Resistant     CEFTRIAXONE <=1 SENSITIVE Sensitive     CIPROFLOXACIN <=0.25 SENSITIVE Sensitive     GENTAMICIN <=1 SENSITIVE Sensitive     IMIPENEM <=0.25 SENSITIVE Sensitive     NITROFURANTOIN <=16 SENSITIVE Sensitive     TRIMETH/SULFA <=20 SENSITIVE Sensitive     AMPICILLIN/SULBACTAM >=32 RESISTANT Resistant     PIP/TAZO 64 INTERMEDIATE Intermediate     Extended ESBL NEGATIVE Sensitive     * >=100,000 COLONIES/mL ESCHERICHIA COLI  Blood culture (routine x 2)     Status: Abnormal   Collection Time: 10/25/17  5:08 AM  Result Value Ref Range Status   Specimen Description   Final    BLOOD RIGHT HAND Performed at [place] [address], [place], [place] [address]    Special Requests   Final    BOTTLES DRAWN AEROBIC  AND ANAEROBIC Blood Culture results may not be optimal due to an inadequate volume of blood received in culture bottles Performed at [place] [address], [place], [place] [address]    Culture  Setup Time   Final    GRAM NEGATIVE RODS IN BOTH AEROBIC AND ANAEROBIC BOTTLES CRITICAL RESULT CALLED TO, READ BACK BY AND VERIFIED WITH: MBELL,PHARMD @2023  10/25/17 BY LHOWARD Performed at [facility], [place] [address] [place], [place] [address]    Culture ESCHERICHIA COLI (A)  Final   Report Status 10/27/2017 FINAL  Final   Organism ID, Bacteria ESCHERICHIA COLI  Final      Susceptibility   Escherichia coli - MIC*    AMPICILLIN >=32 RESISTANT Resistant  CEFAZOLIN >=64 RESISTANT Resistant     CEFEPIME <=1 SENSITIVE Sensitive     CEFTAZIDIME <=1 SENSITIVE Sensitive     CEFTRIAXONE <=1 SENSITIVE Sensitive     CIPROFLOXACIN <=0.25 SENSITIVE Sensitive     GENTAMICIN <=1 SENSITIVE Sensitive     IMIPENEM <=0.25 SENSITIVE Sensitive     TRIMETH/SULFA <=20 SENSITIVE Sensitive     AMPICILLIN/SULBACTAM >=32 RESISTANT Resistant     PIP/TAZO 64 INTERMEDIATE Intermediate     Extended ESBL NEGATIVE Sensitive     * ESCHERICHIA COLI  Blood culture (routine x 2)     Status: Abnormal   Collection Time: 10/25/17  5:08 AM  Result Value Ref Range Status   Specimen Description   Final    BLOOD RIGHT ANTECUBITAL Performed at [place] [address], [place], [place] [address]    Special Requests   Final    BOTTLES DRAWN AEROBIC AND ANAEROBIC Blood Culture results may not be optimal due to an inadequate volume of blood received in culture bottles Performed at [place] [facility] [address] [place], [place] [address]    Culture  Setup Time   Final    GRAM NEGATIVE RODS IN BOTH AEROBIC AND ANAEROBIC BOTTLES CRITICAL VALUE NOTED.  VALUE IS CONSISTENT WITH PREVIOUSLY REPORTED AND CALLED VALUE.    Culture (A)  Final    ESCHERICHIA  COLI SUSCEPTIBILITIES PERFORMED ON PREVIOUS CULTURE WITHIN THE LAST 5 DAYS. Performed at [facility], [place] [address] [place], [place] [address]    Report Status 10/27/2017 FINAL  Final  Blood Culture ID Panel (Reflexed)     Status: Abnormal   Collection Time: 10/25/17  5:08 AM  Result Value Ref Range Status   Enterococcus species NOT DETECTED NOT DETECTED Final   Listeria monocytogenes NOT DETECTED NOT DETECTED Final   Staphylococcus species NOT DETECTED NOT DETECTED Final   Staphylococcus aureus NOT DETECTED NOT DETECTED Final   Streptococcus species NOT DETECTED NOT DETECTED Final   Streptococcus agalactiae NOT DETECTED NOT DETECTED Final   Streptococcus pneumoniae NOT DETECTED NOT DETECTED Final   Streptococcus pyogenes NOT DETECTED NOT DETECTED Final   Acinetobacter baumannii NOT DETECTED NOT DETECTED Final   Enterobacteriaceae species DETECTED (A) NOT DETECTED Final    Comment: Enterobacteriaceae represent a large family of gram-negative bacteria, not a single organism. CRITICAL RESULT CALLED TO, READ BACK BY AND VERIFIED WITH: MBELL,PHARMD @2023  10/25/17 BY LHOWARD    Enterobacter cloacae complex NOT DETECTED NOT DETECTED Final   Escherichia coli DETECTED (A) NOT DETECTED Final    Comment: CRITICAL RESULT CALLED TO, READ BACK BY AND VERIFIED WITH: MBELL,PHARMD @2023  10/25/17 BY LHOWARD    Klebsiella oxytoca NOT DETECTED NOT DETECTED Final   Klebsiella pneumoniae NOT DETECTED NOT DETECTED Final   Proteus species NOT DETECTED NOT DETECTED Final   Serratia marcescens NOT DETECTED NOT DETECTED Final   Carbapenem resistance NOT DETECTED NOT DETECTED Final   Haemophilus influenzae NOT DETECTED NOT DETECTED Final   Neisseria meningitidis NOT DETECTED NOT DETECTED Final   Pseudomonas aeruginosa NOT DETECTED NOT DETECTED Final   Candida albicans NOT DETECTED NOT DETECTED Final   Candida glabrata NOT DETECTED NOT DETECTED Final   Candida krusei NOT DETECTED NOT DETECTED Final    Candida parapsilosis NOT DETECTED NOT DETECTED Final   Candida tropicalis NOT DETECTED NOT DETECTED Final    Comment: Performed at [facility], [place] [address] [place], [place] [address]  Radiology Studies: No results found.      Scheduled Meds: . dexamethasone  4 mg Oral Q6H  . insulin aspart  0-5 Units Subcutaneous QHS  . insulin aspart  0-9 Units Subcutaneous TID WC  . levETIRAcetam  500 mg Oral BID   Continuous Infusions: . cefTRIAXone (ROCEPHIN)  IV Stopped (10/27/17 0935)  . lactated ringers 125 mL/hr at 10/27/17 1554     LOS: 1 day    Time spent: 35 minutes.     Hosie Poisson, MD [facility] Pager [phone]  If 7PM-7AM, please contact night-coverage [email or web address] Password [facility] 10/27/2017, 4:02 PM

## 2017-10-28 ENCOUNTER — Ambulatory Visit
Admission: RE | Admit: 2017-10-28 | Discharge: 2017-10-28 | Disposition: A | Discharge status: Home / Self Care | Payer: BLUE CROSS/BLUE SHIELD | Source: Ambulatory Visit | Attending: Radiation Oncology | Admitting: Radiation Oncology

## 2017-10-28 DIAGNOSIS — R739 Hyperglycemia, unspecified: Secondary | ICD-10-CM

## 2017-10-28 LAB — GLUCOSE, CAPILLARY
GLUCOSE-CAPILLARY: 156 mg/dL — AB (ref 65–99)
GLUCOSE-CAPILLARY: 301 mg/dL — AB (ref 65–99)

## 2017-10-28 MED ORDER — CIPROFLOXACIN HCL 500 MG PO TABS: 500.0000 mg | ORAL_TABLET | Freq: Two times a day (BID) | ORAL | 0 refills | Status: DC

## 2017-10-28 MED ORDER — CIPROFLOXACIN HCL 500 MG PO TABS
500.0000 mg | ORAL_TABLET | Freq: Two times a day (BID) | ORAL | Status: DC
  Administered 2017-10-28: 500 mg via ORAL
  Filled 2017-10-28: qty 1

## 2017-10-28 NOTE — Telephone Encounter (Signed)
Oral Chemotherapy Pharmacist Encounter   I spoke with patient's daughter Christopher Morris on 10/25/17 for overview of: Temodar   Counseled patient on administration, dosing, side effects, monitoring, drug-food interactions, safe handling, storage, and disposal.  Patient will take one Temodar 140 mg capsule and one Temodar 20 mg capsule (160 mg total) by mouth once daily, may take at bedtime and on an empty stomach to decrease nausea and vomiting. Patient will take Temodar concurrent with radiation for 42 days straight. Temodar and radiation start date: 10/28/2017  Patient will take Zofran 8mg  tablet, 1 tablet by mouth 30-60 min prior to Temodar dose to help decrease N/V.  Side effects include but not limited to: nausea, vomiting, anorexia, GI upset, rash, drug fever, and fatigue.  Reviewed with patient importance of keeping a medication schedule and plan for any missed doses.  [name] voiced understanding and appreciation.   All questions answered. Medication reconciliation performed and medication/allergy list updated.  Christopher Morris stated that she had already tried to call [facility] and they stated they were not able to speak with her on patient's behalf until they received power of attorney documentation. This was faxed to [facility] on 10/25/2017  We will reach out to dispensing pharmacy to ensure that they had everything that they need to process patient's prescription. Once this information is received I will follow back up with patient's daughter.  Christopher Morris knows to call the office with questions or concerns. [facility] Oncology Clinic will continue to follow.  Thank you,  [name], PharmD, BCPS, BCOP 10/28/2017  9:01 AM [facility] [phone]

## 2017-10-28 NOTE — Progress Notes (Signed)
Pt's PCP is at the [place], and pt will follow up at [facility] for [place].

## 2017-10-28 NOTE — Care Management Note (Signed)
Case Management Note  Patient Details  Name: Christopher Morris MRN: [id number] Date of Birth: [birth year]  Subjective/Objective: UTI                    Action/Plan: [facility] is [facility] with [facility] and will [facility] home with [facility].   Expected Discharge Date:  10/28/17               Expected Discharge Plan:  [place]  In-House Referral:     Discharge planning Services  CM Consult  Post Acute Care Choice:    Choice offered to:  Patient  DME Arranged:    DME Agency:     HH Arranged:  RN, PT, OT [place] Agency:  [facility] (now [facility])  Status of Service:  Completed, signed off  If discussed at [place] of Stay Meetings, dates discussed:    Additional CommentsPurcell Mouton, RN 10/28/2017, 12:55 PM

## 2017-10-28 NOTE — Evaluation (Signed)
Physical Therapy Evaluation Patient Details Name: Christopher Morris MRN: [id number] DOB: [birth year] Today's Date: 10/28/2017   History of Present Illness  77 yo male admitted with UTI, rigors, fever, sepsis. Hx of glioblastoma-s/p L craniotomy 09/2017, seizures.   Clinical Impression  On eval, pt was Min guard assist for mobility. He walked ~165 feet. He was mildly unsteady at times. No family present during session. Pt participated well. He followed commands well. Cognition seems to have improved since last acute PT notes s/p craniotomy in 09/2017. Recommend HHPT f/u, if pt/family are agreeable. Will follow during hospital stay.     Follow Up Recommendations Home health PT;Supervision/Assistance - 24 hour    Equipment Recommendations  None recommended by PT    Recommendations for Other Services       Precautions / Restrictions Precautions Precautions: Fall Restrictions Weight Bearing Restrictions: No      Mobility  Bed Mobility   Bed Mobility: Supine to Sit     Supine to sit: Supervision     General bed mobility comments: for safety, lines. Pt appeared to be aware of lines as well.   Transfers Overall transfer level: Needs assistance   Transfers: Sit to/from Stand Sit to Stand: Min guard         General transfer comment: close guard for safety. Wide BOS with initial standing  Ambulation/Gait Ambulation/Gait assistance: Min guard Ambulation Distance (Feet): 165 Feet Assistive device: None(IV pole) Gait Pattern/deviations: Step-through pattern     General Gait Details: walked ~80 feet with IV pole then another ~80 feet without external support. Mildly unsteady. Initially, noted pt to reach out for objects to hold on to. Pt tolerated distance well.   Stairs            Wheelchair Mobility    Modified Rankin (Stroke Patients Only)       Balance Overall balance assessment: Needs assistance           Standing balance-Leahy Scale: Fair                                Pertinent Vitals/Pain Pain Assessment: No/denies pain    Home Living Family/patient expects to be discharged to:: Private residence Living Arrangements: Children(daughter) Available Help at Discharge: Family;Available PRN/intermittently Type of Home: Apartment Home Access: Level entry     Home Layout: One level Home Equipment: None Additional Comments: no family present during eval    Prior Function Level of Independence: Needs assistance   Gait / Transfers Assistance Needed: ambulatory without a device     Comments: recent short stay at SNF s/p craniotomy 09/2017     Hand Dominance        Extremity/Trunk Assessment   Upper Extremity Assessment Upper Extremity Assessment: Generalized weakness    Lower Extremity Assessment Lower Extremity Assessment: Generalized weakness    Cervical / Trunk Assessment Cervical / Trunk Assessment: Normal  Communication      Cognition Arousal/Alertness: Awake/alert Behavior During Therapy: WFL for tasks assessed/performed Overall Cognitive Status: Within Functional Limits for tasks assessed                         Following Commands: Follows multi-step commands consistently              General Comments      Exercises     Assessment/Plan    PT Assessment Patient needs continued PT services  PT Problem List  Decreased mobility;Decreased balance;Decreased activity tolerance;Decreased cognition       PT Treatment Interventions Gait training;Therapeutic activities;Therapeutic exercise;Patient/family education;Functional mobility training;Balance training    PT Goals (Current goals can be found in the Care Plan section)  Acute Rehab PT Goals Patient Stated Goal: return home PT Goal Formulation: With patient Time For Goal Achievement: 11/11/17 Potential to Achieve Goals: Good    Frequency Min 3X/week   Barriers to discharge Decreased caregiver support daughter works during  day    Co-evaluation               AM-PAC PT "6 Clicks" Daily Activity  Outcome Measure Difficulty turning over in bed (including adjusting bedclothes, sheets and blankets)?: None Difficulty moving from lying on back to sitting on the side of the bed? : None Difficulty sitting down on and standing up from a chair with arms (e.g., wheelchair, bedside commode, etc,.)?: A Little Help needed moving to and from a bed to chair (including a wheelchair)?: A Little Help needed walking in hospital room?: A Little Help needed climbing 3-5 steps with a railing? : A Little 6 Click Score: 20    End of Session Equipment Utilized During Treatment: Gait belt Activity Tolerance: Patient tolerated treatment well Patient left: in chair;with call bell/phone within reach;with chair alarm set   PT Visit Diagnosis: Unsteadiness on feet (R26.81);Difficulty in walking, not elsewhere classified (R26.2)    Time: 3729-0211 PT Time Calculation (min) (ACUTE ONLY): 24 min   Charges:   PT Evaluation $PT Eval Moderate Complexity: 1 Mod PT Treatments $Gait Training: 8-22 mins   PT G Codes:         Weston Anna, MPT Pager: [phone]

## 2017-10-28 NOTE — Telephone Encounter (Signed)
Oral Oncology Pharmacist Encounter  I spoke with representative at [facility] on 10/25/2017 she is to follow-up on status of patient's Temodar prescriptions.  They have received prescriptions, however received power of attorney paperwork, are able to process prescriptions, and should be able to set up delivery of the Temodar with patient's daughter the evening of 10/25/2017.  There is a $10 copayment due for each strength of the Temodar (20 mg and 140 mg), total copayment $20 for 30-day supply. Per insurance requirement the pharmacy is only able to dispense a 30-day supply at a time.  They will work with the patient and his daughter to dispense the remaining 12 days supply when they are able to process the claim again.  I called [name] back to alert her of above information. No answer. I did leave a voicemail with the above information and instructions for [name] to call dispensing pharmacy at 6 PM on 10/25/2017. I also left phone number for oral oncology clinic on [address] is voicemail.  Oral Oncology Clinic will continue to follow.  [name], PharmD, BCPS, BCOP 10/28/2017 9:06 AM Oral Oncology Clinic [phone]

## 2017-10-28 NOTE — Progress Notes (Signed)
Went over all discharge papers with patient and daughter. All questions answered.  VSS.  HH needs set up per CM.  AVS given to patient.

## 2017-10-28 NOTE — Telephone Encounter (Signed)
Oral Oncology Pharmacist Encounter  Followed up with [facility] this morning. Patient's Temodar prescription has still not been scheduled for delivery. Representative stated they will mark the account is urgent and reach out to patient's daughter to schedule for shipment of Temodar.  Patient recently admitted due to UTI.  Also found to have E. coli bacteremia. Spoke with daughter, radiation treatments did start today (10/28/2017). Patient's daughter stated that she had spoken with dispensing pharmacy and Temodar should arrive tomorrow, 10/29/2017.  Unsure if patient got should go ahead and start Temodar prescription due to active infection. I have reached out to MD for further clarification about start date and will get back in touch with the daughter once I receive further direction from MD.  [name] understands and is in agreement with above plan. She knows to call the office with any additional questions or concerns.  Oral Oncology Clinic will continue to follow.  [name], PharmD, BCPS, BCOP 10/28/2017 5:06 PM Oral Oncology Clinic [phone]

## 2017-10-28 NOTE — Discharge Summary (Addendum)
Physician Discharge Summary  Christopher Morris IPJ:[id number] DOB: August 21, 1940 DOA: 10/25/2017  PCP: Patient, No Pcp Per  Admit date: 10/25/2017 Discharge date: 10/28/2017  Admitted From: Home Disposition:  Home   Recommendations for Outpatient Follow-up:  1. Follow up with PCP in 1-2 weeks 2. Please obtain BMP/CBC in one week 3. Please follow up with Dr Christopher Morris as recommended.     Discharge Condition: STABLE.  CODE STATUS: FULL  Diet recommendation: Heart Healthy   Brief/Interim Summary: Christopher Morris a 77 y.o.malewith medical history significant ofglioblastoma status post left frontal craniotomy and resection of brain tumor on 3/18 ontemodar,history of seizures on Keppra presented with chills and rigors likely due to a urinary tract infection.    Discharge Diagnoses:  Active Problems:   Seizure ([place])   Glioblastoma ([place])   Hyperglycemia   UTI (urinary tract infection)   Bacteremia due to Gram-negative bacteria   Sepsis due to Escherichia coli (E. coli) (H[facility])  Ecoli bacteremia? Ecoli Sepsis from UTI Admitted for IV antibiotics, plan to transition to oral ciprofloxacin on discharge after PT evaluation.  Pt will need 2 more weeks of antibiotics to complete the course.  Repeat blood cultures ordered and negative so far.    History of glioblastoma status post left frontal craniotomy and resection on the March 18 Seizures Follows up with Dr. Mickeal Morris. Resume Decadron. CT head without contrast without acute abnormality. Seizure precautions Resume Keppra   Steroid-induced hyperglycemia  hemoglobin A1c is 5.6,    Anemia of chronic disease Hemoglobin stable around 11.     Discharge Instructions  Discharge Instructions    Diet - low sodium heart healthy   Complete by:  As directed    Discharge instructions   Complete by:  As directed    Please follow up with PCP in 1 week.     Allergies as of 10/28/2017      Reactions   Pork-derived Products     Patient does not eat pork   Shellfish Allergy    Patient does not eat shellfish      Medication List    TAKE these medications   acetaminophen 325 MG tablet Commonly known as:  TYLENOL Take 2 tablets (650 mg total) by mouth every 6 (six) hours as needed for mild pain (or Fever >/= 101).   ciprofloxacin 500 MG tablet Commonly known as:  CIPRO Take 1 tablet (500 mg total) by mouth 2 (two) times daily for 14 days.   dexamethasone 4 MG tablet Commonly known as:  DECADRON Take 1 tablet (4 mg total) by mouth 2 (two) times daily.   levETIRAcetam 500 MG tablet Commonly known as:  KEPPRA Take 1 tablet (500 mg total) by mouth 2 (two) times daily.   temozolomide 140 MG capsule Commonly known as:  TEMODAR Take 1 capsule (140mg ) by mouth once daily. Take with 20mg  capsule for 160mg  total daily dose. May take on an empty stomach to decrease N&V   temozolomide 20 MG capsule Commonly known as:  TEMODAR Take 1 capsule (20mg ) by mouth once daily. Take with 140mg  capsule for 160mg  total daily dose. May take on an empty stomach to decrease N&V       Allergies  Allergen Reactions  . Pork-Derived Products     Patient does not eat pork  . Shellfish Allergy     Patient does not eat shellfish    Consultations:  None.    Procedures/Studies: Dg Chest 2 View  Result Date: 10/25/2017 CLINICAL DATA:  Altered mental status  EXAM: CHEST - 2 VIEW COMPARISON:  09/25/2017 FINDINGS: Cardiac shadow is within normal limits. The lungs are well aerated bilaterally. Very minimal left basilar atelectasis is seen. No bony abnormality is noted. IMPRESSION: Minimal left basilar atelectasis. Electronically Signed   By: Christopher Morris M.D.   On: 10/25/2017 06:51   Ct Head Wo Contrast  Result Date: 10/25/2017 CLINICAL DATA:  Status post glioblastoma resection with difficulty speaking EXAM: CT HEAD WITHOUT CONTRAST TECHNIQUE: Contiguous axial images were obtained from the base of the skull through the vertex  without intravenous contrast. COMPARISON:  09/27/2017, 10/02/2017 FINDINGS: Brain: Postsurgical changes are noted in the left frontal lobe consistent with the recent glioblastoma resection. Some residual centrally necrotic neoplasm remains measuring approximately 2.3 x 2.3 cm. The overall appearance is stable when compared with the prior MRI. No findings to suggest acute hemorrhage or acute infarct are noted. Mild residual edematous changes are seen. No significant midline shift is noted. Vascular: No hyperdense vessel or unexpected calcification. Skull: Postsurgical changes in the left frontal region. Sinuses/Orbits: No acute finding. Other: None. IMPRESSION: Significant reduction in size in left frontal neoplasm when compared with the preoperative exam. The overall appearance is stable when compared with the recent MRI. No acute abnormality is noted. Electronically Signed   By: Christopher Morris M.D.   On: 10/25/2017 06:50   Mr Christopher Morris BH Contrast  Result Date: 10/02/2017 CLINICAL DATA:  Tumor resection 09/30/2016. EXAM: MRI HEAD WITHOUT AND WITH CONTRAST TECHNIQUE: Multiplanar, multiecho pulse sequences of the brain and surrounding structures were obtained without and with intravenous contrast. CONTRAST:  42mL MULTIHANCE GADOBENATE DIMEGLUMINE 529 MG/ML IV SOLN COMPARISON:  Preoperative MRI of the brain 09/25/2017. FINDINGS: Brain: Left frontal craniotomy is noted. The bulk of the tumor has been resected. There is some residual enhancement along the posterior margin of the surgical cavity as well as the superomedial and inferolateral margins. The deep margin demonstrates no significant enhancement. Posteriorly the area of enhancement extends obliquely 3.2 cm. Blood products are evident within the surgical cavity. Extent of surrounding vasogenic edema is not significantly changed. Mass effect is significantly relieved. There is still some asymmetric impact on the frontal horn of the left lateral ventricle. Sulcal  effacement over the convexity is relieved. Minimal midline shift remains. Mild atrophy and white matter disease is otherwise stable. No distal enhancement is present. Expected enhancement along the dura is noted following surgery. Vascular: Flow is present in the major intracranial arteries. Skull and upper cervical spine: The skull base is within normal limits. Degenerative changes are noted in the upper cervical spine, most evident at C3-4 were central canal stenosis is present. Degenerative endplate changes are present. Marrow signal is otherwise normal. Sinuses/Orbits: The paranasal sinuses and mastoid air cells are clear. Globes and orbits are within normal limits. IMPRESSION: 1. Near complete resection of tumor in the left frontal lobe. 2. Rim of residual enhancement posteriorly along the margin of the resection cavity with some enhancement superior medial and inferolateral. The majority of the surgical cavity does not enhance, suggesting some residual tumor posteriorly as described. 3. Surrounding vasogenic edema is stable with decreased mass effect. 4. Blood products are noted within the resection cavity. 5. Mild atrophy and white matter disease is otherwise stable. No acute infarct is present. Electronically Signed   By: San Morelle M.D.   On: 10/02/2017 12:48     Subjective: No new complaints.   Discharge Exam: Vitals:   10/27/17 2125 10/28/17 0514  BP: 129/75 (!) 147/91  Pulse: 68 63  Resp: 18 18  Temp: 97.9 F (36.6 C) 97.8 F (36.6 C)  SpO2: 95% 92%   Vitals:   10/27/17 0534 10/27/17 1632 10/27/17 2125 10/28/17 0514  BP: (!) 149/90 121/78 129/75 (!) 147/91  Pulse: (!) 52 70 68 63  Resp: 18 18 18 18   Temp: 97.6 F (36.4 C) 98.1 F (36.7 C) 97.9 F (36.6 C) 97.8 F (36.6 C)  TempSrc: Oral Oral Oral Oral  SpO2: 98% 94% 95% 92%  Weight:      Height:        General: Pt is alert, awake, not in acute distress Cardiovascular: RRR, S1/S2 +, no rubs, no  gallops Respiratory: CTA bilaterally, no wheezing, no rhonchi Abdominal: Soft, NT, ND, bowel sounds + Extremities: no edema, no cyanosis    The results of significant diagnostics from this hospitalization (including imaging, microbiology, ancillary and laboratory) are listed below for reference.     Microbiology: Recent Results (from the past 240 hour(s))  Urine Culture     Status: Abnormal   Collection Time: 10/25/17  4:54 AM  Result Value Ref Range Status   Specimen Description   Final    URINE, CLEAN CATCH Performed at [facility], [place] [address] [place], [place] [address]    Special Requests   Final    NONE Performed at [facility], [place] [address], [place], [place] [address]    Culture >=100,000 COLONIES/mL ESCHERICHIA COLI (A)  Final   Report Status 10/27/2017 FINAL  Final   Organism ID, Bacteria ESCHERICHIA COLI (A)  Final      Susceptibility   Escherichia coli - MIC*    AMPICILLIN >=32 RESISTANT Resistant     CEFAZOLIN >=64 RESISTANT Resistant     CEFTRIAXONE <=1 SENSITIVE Sensitive     CIPROFLOXACIN <=0.25 SENSITIVE Sensitive     GENTAMICIN <=1 SENSITIVE Sensitive     IMIPENEM <=0.25 SENSITIVE Sensitive     NITROFURANTOIN <=16 SENSITIVE Sensitive     TRIMETH/SULFA <=20 SENSITIVE Sensitive     AMPICILLIN/SULBACTAM >=32 RESISTANT Resistant     PIP/TAZO 64 INTERMEDIATE Intermediate     Extended ESBL NEGATIVE Sensitive     * >=100,000 COLONIES/mL ESCHERICHIA COLI  Blood culture (routine x 2)     Status: Abnormal   Collection Time: 10/25/17  5:08 AM  Result Value Ref Range Status   Specimen Description   Final    BLOOD RIGHT HAND Performed at [facility] [address] [place], [place] [address]    Special Requests   Final    BOTTLES DRAWN AEROBIC AND ANAEROBIC Blood Culture results may not be optimal due to an inadequate volume of blood received in culture bottles Performed at [facility] [address] [place], [place] [address]    Culture  Setup Time   Final    GRAM NEGATIVE RODS IN BOTH AEROBIC AND ANAEROBIC BOTTLES CRITICAL RESULT CALLED TO, READ BACK BY AND VERIFIED WITH: MBELL,PHARMD @2023  10/25/17 BY LHOWARD Performed at [facility], [place] [address], [place],  [address]    Culture ESCHERICHIA COLI (A)  Final   Report Status 10/27/2017 FINAL  Final   Organism ID, Bacteria ESCHERICHIA COLI  Final      Susceptibility   Escherichia coli - MIC*    AMPICILLIN >=32 RESISTANT Resistant     CEFAZOLIN >=64 RESISTANT Resistant     CEFEPIME <=1 SENSITIVE Sensitive     CEFTAZIDIME <=1 SENSITIVE  Sensitive     CEFTRIAXONE <=1 SENSITIVE Sensitive     CIPROFLOXACIN <=0.25 SENSITIVE Sensitive     GENTAMICIN <=1 SENSITIVE Sensitive     IMIPENEM <=0.25 SENSITIVE Sensitive     TRIMETH/SULFA <=20 SENSITIVE Sensitive     AMPICILLIN/SULBACTAM >=32 RESISTANT Resistant     PIP/TAZO 64 INTERMEDIATE Intermediate     Extended ESBL NEGATIVE Sensitive     * ESCHERICHIA COLI  Blood culture (routine x 2)     Status: Abnormal   Collection Time: 10/25/17  5:08 AM  Result Value Ref Range Status   Specimen Description   Final    BLOOD RIGHT ANTECUBITAL Performed at [facility] [address], [place], [place] [address]    Special Requests   Final    BOTTLES DRAWN AEROBIC AND ANAEROBIC Blood Culture results may not be optimal due to an inadequate volume of blood received in culture bottles Performed at [facility] [address], [place], [place] [address]    Culture  Setup Time   Final    GRAM NEGATIVE RODS IN BOTH AEROBIC AND ANAEROBIC BOTTLES CRITICAL VALUE NOTED.  VALUE IS CONSISTENT WITH PREVIOUSLY REPORTED AND CALLED VALUE.    Culture (A)  Final    ESCHERICHIA COLI SUSCEPTIBILITIES PERFORMED ON PREVIOUS CULTURE WITHIN THE LAST 5 DAYS. Performed at [facility], [place] [address], [place], [place] [address]     Report Status 10/27/2017 FINAL  Final  Blood Culture ID Panel (Reflexed)     Status: Abnormal   Collection Time: 10/25/17  5:08 AM  Result Value Ref Range Status   Enterococcus species NOT DETECTED NOT DETECTED Final   Listeria monocytogenes NOT DETECTED NOT DETECTED Final   Staphylococcus species NOT DETECTED NOT DETECTED Final   Staphylococcus aureus NOT DETECTED NOT DETECTED Final   Streptococcus species NOT DETECTED NOT DETECTED Final   Streptococcus agalactiae NOT DETECTED NOT DETECTED Final   Streptococcus pneumoniae NOT DETECTED NOT DETECTED Final   Streptococcus pyogenes NOT DETECTED NOT DETECTED Final   Acinetobacter baumannii NOT DETECTED NOT DETECTED Final   Enterobacteriaceae species DETECTED (A) NOT DETECTED Final    Comment: Enterobacteriaceae represent a large family of gram-negative bacteria, not a single organism. CRITICAL RESULT CALLED TO, READ BACK BY AND VERIFIED WITH: MBELL,PHARMD @2023  10/25/17 BY LHOWARD    Enterobacter cloacae complex NOT DETECTED NOT DETECTED Final   Escherichia coli DETECTED (A) NOT DETECTED Final    Comment: CRITICAL RESULT CALLED TO, READ BACK BY AND VERIFIED WITH: MBELL,PHARMD @2023  10/25/17 BY LHOWARD    Klebsiella oxytoca NOT DETECTED NOT DETECTED Final   Klebsiella pneumoniae NOT DETECTED NOT DETECTED Final   Proteus species NOT DETECTED NOT DETECTED Final   Serratia marcescens NOT DETECTED NOT DETECTED Final   Carbapenem resistance NOT DETECTED NOT DETECTED Final   Haemophilus influenzae NOT DETECTED NOT DETECTED Final   Neisseria meningitidis NOT DETECTED NOT DETECTED Final   Pseudomonas aeruginosa NOT DETECTED NOT DETECTED Final   Candida albicans NOT DETECTED NOT DETECTED Final   Candida glabrata NOT DETECTED NOT DETECTED Final   Candida krusei NOT DETECTED NOT DETECTED Final   Candida parapsilosis NOT DETECTED NOT DETECTED Final   Candida tropicalis NOT DETECTED NOT DETECTED Final    Comment: Performed at [facility], [place] [address] [place], [place] [address]     Labs: BNP (last 3 results) No results for input(s): BNP in the last 8760 hours. Basic Metabolic Panel: Recent Labs  Lab 10/25/17  0231 10/26/17 0434 10/27/17 0414  NA 136 137 136  K 4.4 4.6 5.4*  CL 98* 102 100*  CO2 28 25 28   GLUCOSE 102* 248* 196*  BUN 24* 19 20  CREATININE 0.94 0.76 0.91  CALCIUM 8.3* 8.4* 8.7*   Liver Function Tests: Recent Labs  Lab 10/25/17 0231 10/26/17 0434  AST 23 19  ALT 43 44  ALKPHOS 81 72  BILITOT 1.0 0.6  PROT 5.3* 5.1*  ALBUMIN 2.6* 2.1*   No results for input(s): LIPASE, AMYLASE in the last 168 hours. No results for input(s): AMMONIA in the last 168 hours. CBC: Recent Labs  Lab 10/25/17 0231 10/26/17 0434 10/27/17 0414  WBC 7.4 7.4 10.6*  NEUTROABS 6.2  --   --   HGB 11.9* 11.4* 11.7*  HCT 36.4* 33.3* 35.0*  MCV 97.3 96.0 97.0  PLT 98* 78* 89*   Cardiac Enzymes: No results for input(s): CKTOTAL, CKMB, CKMBINDEX, TROPONINI in the last 168 hours. BNP: Invalid input(s): POCBNP CBG: Recent Labs  Lab 10/27/17 0832 10/27/17 1218 10/27/17 1630 10/27/17 2123 10/28/17 0729  GLUCAP 244* 247* 184* 262* 156*   D-Dimer No results for input(s): DDIMER in the last 72 hours. Hgb A1c No results for input(s): HGBA1C in the last 72 hours. Lipid Profile No results for input(s): CHOL, HDL, LDLCALC, TRIG, CHOLHDL, LDLDIRECT in the last 72 hours. Thyroid function studies No results for input(s): TSH, T4TOTAL, T3FREE, THYROIDAB in the last 72 hours.  Invalid input(s): FREET3 Anemia work up No results for input(s): VITAMINB12, FOLATE, FERRITIN, TIBC, IRON, RETICCTPCT in the last 72 hours. Urinalysis    Component Value Date/Time   COLORURINE YELLOW 10/25/2017 0454   APPEARANCEUR CLOUDY (A) 10/25/2017 0454   LABSPEC 1.015 10/25/2017 0454   PHURINE 6.0 10/25/2017 0454   GLUCOSEU NEGATIVE 10/25/2017 0454   HGBUR MODERATE (A) 10/25/2017 0454   BILIRUBINUR NEGATIVE 10/25/2017 0454    KETONESUR NEGATIVE 10/25/2017 0454   PROTEINUR 100 (A) 10/25/2017 0454   NITRITE NEGATIVE 10/25/2017 0454   LEUKOCYTESUR LARGE (A) 10/25/2017 0454   Sepsis Labs Invalid input(s): PROCALCITONIN,  WBC,  LACTICIDVEN Microbiology Recent Results (from the past 240 hour(s))  Urine Culture     Status: Abnormal   Collection Time: 10/25/17  4:54 AM  Result Value Ref Range Status   Specimen Description   Final    URINE, CLEAN CATCH Performed at [facility], [place] [address] [place], [place] [address]    Special Requests   Final    NONE Performed at [facility], [place] [address] [place], [place] [address]    Culture >=100,000 COLONIES/mL ESCHERICHIA COLI (A)  Final   Report Status 10/27/2017 FINAL  Final   Organism ID, Bacteria ESCHERICHIA COLI (A)  Final      Susceptibility   Escherichia coli - MIC*    AMPICILLIN >=32 RESISTANT Resistant     CEFAZOLIN >=64 RESISTANT Resistant     CEFTRIAXONE <=1 SENSITIVE Sensitive     CIPROFLOXACIN <=0.25 SENSITIVE Sensitive     GENTAMICIN <=1 SENSITIVE Sensitive     IMIPENEM <=0.25 SENSITIVE Sensitive     NITROFURANTOIN <=16 SENSITIVE Sensitive     TRIMETH/SULFA <=20 SENSITIVE Sensitive     AMPICILLIN/SULBACTAM >=32 RESISTANT Resistant     PIP/TAZO 64 INTERMEDIATE Intermediate     Extended ESBL NEGATIVE Sensitive     * >=100,000 COLONIES/mL ESCHERICHIA COLI  Blood culture (routine x 2)     Status: Abnormal   Collection Time: 10/25/17  5:08  AM  Result Value Ref Range Status   Specimen Description   Final    BLOOD RIGHT HAND Performed at [facility] [address], [place], [place] [address]    Special Requests   Final    BOTTLES DRAWN AEROBIC AND ANAEROBIC Blood Culture results may not be optimal due to an inadequate volume of blood received in culture bottles Performed at [facility] [address], [place], [place] [address]    Culture  Setup Time   Final    GRAM  NEGATIVE RODS IN BOTH AEROBIC AND ANAEROBIC BOTTLES CRITICAL RESULT CALLED TO, READ BACK BY AND VERIFIED WITH: MBELL,PHARMD @2023  10/25/17 BY LHOWARD Performed at [facility], [address], [place], [place] [address]    Culture ESCHERICHIA COLI (A)  Final   Report Status 10/27/2017 FINAL  Final   Organism ID, Bacteria ESCHERICHIA COLI  Final      Susceptibility   Escherichia coli - MIC*    AMPICILLIN >=32 RESISTANT Resistant     CEFAZOLIN >=64 RESISTANT Resistant     CEFEPIME <=1 SENSITIVE Sensitive     CEFTAZIDIME <=1 SENSITIVE Sensitive     CEFTRIAXONE <=1 SENSITIVE Sensitive     CIPROFLOXACIN <=0.25 SENSITIVE Sensitive     GENTAMICIN <=1 SENSITIVE Sensitive     IMIPENEM <=0.25 SENSITIVE Sensitive     TRIMETH/SULFA <=20 SENSITIVE Sensitive     AMPICILLIN/SULBACTAM >=32 RESISTANT Resistant     PIP/TAZO 64 INTERMEDIATE Intermediate     Extended ESBL NEGATIVE Sensitive     * ESCHERICHIA COLI  Blood culture (routine x 2)     Status: Abnormal   Collection Time: 10/25/17  5:08 AM  Result Value Ref Range Status   Specimen Description   Final    BLOOD RIGHT ANTECUBITAL Performed at [facility] [address] [place], [place] [address]    Special Requests   Final    BOTTLES DRAWN AEROBIC AND ANAEROBIC Blood Culture results may not be optimal due to an inadequate volume of blood received in culture bottles Performed at [facility] [address] [place], [place] [address]    Culture  Setup Time   Final    GRAM NEGATIVE RODS IN BOTH AEROBIC AND ANAEROBIC BOTTLES CRITICAL VALUE NOTED.  VALUE IS CONSISTENT WITH PREVIOUSLY REPORTED AND CALLED VALUE.    Culture (A)  Final    ESCHERICHIA COLI SUSCEPTIBILITIES PERFORMED ON PREVIOUS CULTURE WITHIN THE LAST 5 DAYS. Performed at [facility], [place] [address], [place], [place] [address]    Report Status 10/27/2017 FINAL  Final  Blood Culture ID Panel (Reflexed)     Status: Abnormal    Collection Time: 10/25/17  5:08 AM  Result Value Ref Range Status   Enterococcus species NOT DETECTED NOT DETECTED Final   Listeria monocytogenes NOT DETECTED NOT DETECTED Final   Staphylococcus species NOT DETECTED NOT DETECTED Final   Staphylococcus aureus NOT DETECTED NOT DETECTED Final   Streptococcus species NOT DETECTED NOT DETECTED Final   Streptococcus agalactiae NOT DETECTED NOT DETECTED Final   Streptococcus pneumoniae NOT DETECTED NOT DETECTED Final   Streptococcus pyogenes NOT DETECTED NOT DETECTED Final   Acinetobacter baumannii NOT DETECTED NOT DETECTED Final   Enterobacteriaceae species DETECTED (A) NOT DETECTED Final    Comment: Enterobacteriaceae represent a large family of gram-negative bacteria, not a single organism. CRITICAL RESULT CALLED TO, READ BACK BY AND VERIFIED WITH: MBELL,PHARMD @2023  10/25/17 BY [facility]  Enterobacter cloacae complex NOT DETECTED NOT DETECTED Final   Escherichia coli DETECTED (A) NOT DETECTED Final    Comment: CRITICAL RESULT CALLED TO, READ BACK BY AND VERIFIED WITH: MBELL,PHARMD @2023  10/25/17 BY LHOWARD    Klebsiella oxytoca NOT DETECTED NOT DETECTED Final   Klebsiella pneumoniae NOT DETECTED NOT DETECTED Final   Proteus species NOT DETECTED NOT DETECTED Final   Serratia marcescens NOT DETECTED NOT DETECTED Final   Carbapenem resistance NOT DETECTED NOT DETECTED Final   Haemophilus influenzae NOT DETECTED NOT DETECTED Final   Neisseria meningitidis NOT DETECTED NOT DETECTED Final   Pseudomonas aeruginosa NOT DETECTED NOT DETECTED Final   Candida albicans NOT DETECTED NOT DETECTED Final   Candida glabrata NOT DETECTED NOT DETECTED Final   Candida krusei NOT DETECTED NOT DETECTED Final   Candida parapsilosis NOT DETECTED NOT DETECTED Final   Candida tropicalis NOT DETECTED NOT DETECTED Final    Comment: Performed at [facility], [place] [address], [place], [place] [address]     Time coordinating discharge: 34  minutes  SIGNED:   Hosie Poisson, MD  [facility] 10/28/2017, 9:32 AM Pager   If 7PM-7AM, please contact night-coverage [email or web address] Password [email or web address]

## 2017-10-29 ENCOUNTER — Ambulatory Visit
Admission: RE | Admit: 2017-10-29 | Discharge: 2017-10-29 | Disposition: A | Discharge status: Home / Self Care | Payer: BLUE CROSS/BLUE SHIELD | Source: Ambulatory Visit | Attending: Radiation Oncology | Admitting: Radiation Oncology

## 2017-10-29 DIAGNOSIS — Z510 Encounter for antineoplastic radiation therapy: Secondary | ICD-10-CM | POA: Diagnosis not present

## 2017-10-29 DIAGNOSIS — C711 Malignant neoplasm of frontal lobe: Secondary | ICD-10-CM | POA: Diagnosis present

## 2017-10-29 NOTE — Telephone Encounter (Signed)
Oral Oncology Pharmacist Encounter  I have not received further clarification from MD about Temodar start date due to active infection.  I left voicemail for patient's daughter, [name], with directions to not start Temodar until we are able to confirm with MD.  Left voicemail with phone number to oral oncology clinic in case patient's daughter has further questions.  Oral Oncology Clinic will continue to follow.  [name], PharmD, BCPS, BCOP 10/29/2017 10:22 AM Oral Oncology Clinic [phone]

## 2017-10-30 ENCOUNTER — Telehealth: Payer: Self-pay | Admitting: *Deleted

## 2017-10-30 ENCOUNTER — Ambulatory Visit
Admission: RE | Admit: 2017-10-30 | Discharge: 2017-10-30 | Disposition: A | Discharge status: Home / Self Care | Payer: BLUE CROSS/BLUE SHIELD | Source: Ambulatory Visit | Attending: Radiation Oncology | Admitting: Radiation Oncology

## 2017-10-30 DIAGNOSIS — C711 Malignant neoplasm of frontal lobe: Secondary | ICD-10-CM | POA: Diagnosis not present

## 2017-10-30 NOTE — Telephone Encounter (Signed)
Patient's daughter called with questions about the risks of holding chemo treatment till patient has completed his course of antibiotic Cipro 500 mg. Additionally patient was wondering if her father should be taking 2 mg or 4 mg of Decadron.  Per Mickeal Skinner, MD there are no risk to holding the chemo while taking antibiotic. Also patient should take Decadron 2 mg. Patient's daughter verbalized understanding.

## 2017-10-30 NOTE — Telephone Encounter (Signed)
Oral Oncology Pharmacist Encounter  Received about Temodar start date for patient due to active infection. Further clarification per MD okay to start Temodar once patient has finished the entire course of antibiotics and remains asymptomatic from infection.  I left voicemail for patient's daughter, [name], with above directions. I stated that patient should completely finish entire course of twice daily ciprofloxacin, and then start Temodar at bedtime the following day if patient remains asymptomatic from current infection.  I left voicemail for oral oncology clinic if [facility] or patient has any further questions.  Oral Oncology Clinic will continue to follow.  [name], PharmD, BCPS, BCOP 10/30/2017 11:01 AM Oral Oncology Clinic [phone]

## 2017-10-31 ENCOUNTER — Ambulatory Visit
Admission: RE | Admit: 2017-10-31 | Discharge: 2017-10-31 | Disposition: A | Discharge status: Home / Self Care | Payer: BLUE CROSS/BLUE SHIELD | Source: Ambulatory Visit | Attending: Radiation Oncology | Admitting: Radiation Oncology

## 2017-10-31 DIAGNOSIS — C711 Malignant neoplasm of frontal lobe: Secondary | ICD-10-CM | POA: Diagnosis not present

## 2017-11-01 ENCOUNTER — Ambulatory Visit
Admission: RE | Admit: 2017-11-01 | Discharge: 2017-11-01 | Disposition: A | Discharge status: Home / Self Care | Payer: BLUE CROSS/BLUE SHIELD | Source: Ambulatory Visit | Attending: Radiation Oncology | Admitting: Radiation Oncology

## 2017-11-01 DIAGNOSIS — C719 Malignant neoplasm of brain, unspecified: Secondary | ICD-10-CM

## 2017-11-01 DIAGNOSIS — C711 Malignant neoplasm of frontal lobe: Secondary | ICD-10-CM | POA: Diagnosis not present

## 2017-11-01 MED ORDER — SONAFINE EX EMUL
1.0000 "application " | Freq: Two times a day (BID) | CUTANEOUS | Status: DC
  Administered 2017-11-01: 1 via TOPICAL

## 2017-11-02 LAB — CULTURE, BLOOD (ROUTINE X 2)
CULTURE: NO GROWTH
CULTURE: NO GROWTH
SPECIAL REQUESTS: ADEQUATE
Special Requests: ADEQUATE

## 2017-11-04 ENCOUNTER — Ambulatory Visit
Admission: RE | Admit: 2017-11-04 | Discharge: 2017-11-04 | Disposition: A | Discharge status: Home / Self Care | Payer: BLUE CROSS/BLUE SHIELD | Source: Ambulatory Visit | Attending: Radiation Oncology | Admitting: Radiation Oncology

## 2017-11-04 DIAGNOSIS — C711 Malignant neoplasm of frontal lobe: Secondary | ICD-10-CM | POA: Diagnosis not present

## 2017-11-05 ENCOUNTER — Ambulatory Visit
Admission: RE | Admit: 2017-11-05 | Discharge: 2017-11-05 | Disposition: A | Discharge status: Home / Self Care | Payer: BLUE CROSS/BLUE SHIELD | Source: Ambulatory Visit | Attending: Radiation Oncology | Admitting: Radiation Oncology

## 2017-11-05 DIAGNOSIS — C711 Malignant neoplasm of frontal lobe: Secondary | ICD-10-CM | POA: Diagnosis not present

## 2017-11-06 ENCOUNTER — Telehealth: Payer: Self-pay | Admitting: *Deleted

## 2017-11-06 ENCOUNTER — Ambulatory Visit
Admission: RE | Admit: 2017-11-06 | Discharge: 2017-11-06 | Disposition: A | Discharge status: Home / Self Care | Payer: BLUE CROSS/BLUE SHIELD | Source: Ambulatory Visit | Attending: Radiation Oncology | Admitting: Radiation Oncology

## 2017-11-06 DIAGNOSIS — C711 Malignant neoplasm of frontal lobe: Secondary | ICD-10-CM | POA: Diagnosis not present

## 2017-11-06 NOTE — Telephone Encounter (Signed)
Pt's daughter called stating pt having increased neurological deficits including weakness, numbness in the legs, "legs giving out", trouble ambulating.  Daughter stated vital signs checked and stable.  Pt has appt with Dr. Mickeal Skinner tomorrow, informed daughter that these issues can be common side effects of brain tumors,  advised that biggest concern is pt safety.  Daughter stated she would be with patient over night.  Other issues to be addressed at office visit with Dr. Mickeal Skinner tomorrow.  Daughter verbalized understanding.

## 2017-11-07 ENCOUNTER — Encounter: Payer: Self-pay | Admitting: Internal Medicine

## 2017-11-07 ENCOUNTER — Inpatient Hospital Stay: Payer: BLUE CROSS/BLUE SHIELD

## 2017-11-07 ENCOUNTER — Telehealth: Payer: Self-pay | Admitting: Internal Medicine

## 2017-11-07 ENCOUNTER — Ambulatory Visit
Admission: RE | Admit: 2017-11-07 | Discharge: 2017-11-07 | Disposition: A | Discharge status: Home / Self Care | Payer: BLUE CROSS/BLUE SHIELD | Source: Ambulatory Visit | Attending: Radiation Oncology | Admitting: Radiation Oncology

## 2017-11-07 ENCOUNTER — Inpatient Hospital Stay (HOSPITAL_BASED_OUTPATIENT_CLINIC_OR_DEPARTMENT_OTHER): Payer: BLUE CROSS/BLUE SHIELD | Admitting: Internal Medicine

## 2017-11-07 VITALS — BP 135/87 | HR 100 | Temp 98.8°F | Resp 18 | Ht 76.0 in | Wt 187.1 lb

## 2017-11-07 DIAGNOSIS — C719 Malignant neoplasm of brain, unspecified: Secondary | ICD-10-CM

## 2017-11-07 DIAGNOSIS — R569 Unspecified convulsions: Secondary | ICD-10-CM | POA: Diagnosis not present

## 2017-11-07 DIAGNOSIS — C711 Malignant neoplasm of frontal lobe: Secondary | ICD-10-CM | POA: Diagnosis not present

## 2017-11-07 LAB — CMP (CANCER CENTER ONLY)
ALBUMIN: 2.9 g/dL — AB (ref 3.5–5.0)
ALK PHOS: 76 U/L (ref 40–150)
ALT: 36 U/L (ref 0–55)
ANION GAP: 13 — AB (ref 3–11)
AST: 19 U/L (ref 5–34)
BILIRUBIN TOTAL: 0.9 mg/dL (ref 0.2–1.2)
BUN: 12 mg/dL (ref 7–26)
CALCIUM: 9.1 mg/dL (ref 8.4–10.4)
CO2: 25 mmol/L (ref 22–29)
Chloride: 102 mmol/L (ref 98–109)
Creatinine: 0.92 mg/dL (ref 0.70–1.30)
GLUCOSE: 183 mg/dL — AB (ref 70–140)
POTASSIUM: 4.1 mmol/L (ref 3.5–5.1)
Sodium: 140 mmol/L (ref 136–145)
TOTAL PROTEIN: 6.5 g/dL (ref 6.4–8.3)

## 2017-11-07 LAB — CBC WITH DIFFERENTIAL (CANCER CENTER ONLY)
BASOS PCT: 0 %
Basophils Absolute: 0 10*3/uL (ref 0.0–0.1)
Eosinophils Absolute: 0 10*3/uL (ref 0.0–0.5)
Eosinophils Relative: 1 %
HEMATOCRIT: 36.1 % — AB (ref 38.4–49.9)
HEMOGLOBIN: 12 g/dL — AB (ref 13.0–17.1)
LYMPHS PCT: 21 %
Lymphs Abs: 1.8 10*3/uL (ref 0.9–3.3)
MCH: 32.5 pg (ref 27.2–33.4)
MCHC: 33.2 g/dL (ref 32.0–36.0)
MCV: 97.8 fL (ref 79.3–98.0)
Monocytes Absolute: 0.2 10*3/uL (ref 0.1–0.9)
Monocytes Relative: 3 %
NEUTROS PCT: 75 %
Neutro Abs: 6.7 10*3/uL — ABNORMAL HIGH (ref 1.5–6.5)
Platelet Count: 127 10*3/uL — ABNORMAL LOW (ref 140–400)
RBC: 3.69 MIL/uL — AB (ref 4.20–5.82)
RDW: 13.2 % (ref 11.0–14.6)
WBC: 8.8 10*3/uL (ref 4.0–10.3)

## 2017-11-07 MED ORDER — ONDANSETRON HCL 8 MG PO TABS: 8.0000 mg | ORAL_TABLET | Freq: Two times a day (BID) | ORAL | 1 refills | Status: DC | PRN

## 2017-11-07 NOTE — Telephone Encounter (Signed)
Appointments scheduled [facility]/[place] printed per 4/25 los

## 2017-11-07 NOTE — Progress Notes (Signed)
[facility] [place], [place] [address] [phone]   Interval Evaluation  Date of Service: 11/07/17 Patient Name: Christopher Morris Patient MRN: [id number] Patient DOB: June 21, 1941 Provider: Ventura Sellers, MD  Identifying Statement:  Christopher Morris is a 77 y.o. male with left frontal glioblastoma    Oncologic History:   Glioblastoma ([place])   09/30/2017 Surgery    Craniotomy, resection by Dr. Sherwood Gambler.  Path demonstrates glioblastoma      10/28/2017 -  Radiation Therapy    IMRT with concurrent Temodar       Biomarkers:  MGMT Methylated.  IDH 1/2 Unknown.  EGFR Unknown  TERT Unknown   Interval History:  Christopher Morris presents today for follow up after starting radiation therapy on 4/15.  He did have a symptomatic urinary tract infection- fever, confusion, rigors, requiring brief admission and 2 weeks course of antibiotics which he has almost completed.  This was just prior to the onset of radiation.  At this time he feels at his baseline, with minimal right sided weakness, and only occasional word finding difficulty.  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  He plans to start temodar once antibiotics are complete, per our instruction.    Medications: Current Outpatient Medications on File Prior to Visit  Medication Sig Dispense Refill  . ciprofloxacin (CIPRO) 500 MG tablet Take 1 tablet (500 mg total) by mouth 2 (two) times daily for 14 days. 28 tablet 0  . dexamethasone (DECADRON) 4 MG tablet Take 1 tablet (4 mg total) by mouth 2 (two) times daily. 30 tablet 2  . levETIRAcetam (KEPPRA) 500 MG tablet Take 1 tablet (500 mg total) by mouth 2 (two) times daily. 60 tablet 0  . acetaminophen (TYLENOL) 325 MG tablet Take 2 tablets (650 mg total) by mouth every 6 (six) hours as needed for mild pain (or Fever >/= 101). (Patient not taking: Reported on 10/25/2017)    . temozolomide  (TEMODAR) 140 MG capsule Take 1 capsule ('140mg'$ ) by mouth once daily. Take with '20mg'$  capsule for '160mg'$  total daily dose. May take on an empty stomach to decrease N&V (Patient not taking: Reported on 11/07/2017) 42 capsule 0  . temozolomide (TEMODAR) 20 MG capsule Take 1 capsule ('20mg'$ ) by mouth once daily. Take with '140mg'$  capsule for '160mg'$  total daily dose. May take on an empty stomach to decrease N&V (Patient not taking: Reported on 11/07/2017) 42 capsule 0   No current facility-administered medications on file prior to visit.     Allergies:  Allergies  Allergen Reactions  . Pork-Derived Products     Patient does not eat pork  . Shellfish Allergy     Patient does not eat shellfish   Past Medical History:  Past Medical History:  Diagnosis Date  . Glioblastoma ([facility]) 09/25/2017  . Hyperglycemia 09/25/2017  . Seizure ([facility]) 09/25/2017   Past Surgical History:  Past Surgical History:  Procedure Laterality Date  . APPLICATION OF CRANIAL NAVIGATION N/A 09/30/2017   Procedure: APPLICATION OF CRANIAL NAVIGATION;  Surgeon: [name], MD;  Location: [facility];  Service: Neurosurgery;  Laterality: N/A;  . CRANIOTOMY N/A 09/30/2017   Procedure: CRANIOTOMY FOR RESECTION OF BRAIN TUMOR WITH STEREOTACTIC;  Surgeon: [name], MD;  Location: [facility];  Service: Neurosurgery;  Laterality: N/A;  CRANIOTOMY FOR RESECTION OF BRAIN TUMOR WITH STEREOTACTIC   Social History:  Social History   Socioeconomic History  . Marital status: Divorced  Spouse name: Not on file  . Number of children: Not on file  . Years of education: Not on file  . Highest education level: Not on file  Occupational History  . Not on file  Social Needs  . Financial resource strain: Not on file  . Food insecurity:    Worry: Not on file    Inability: Not on file  . Transportation needs:    Medical: No    Non-medical: No  Tobacco Use  . Smoking status: Never Smoker  . Smokeless tobacco: Never Used  Substance and Sexual  Activity  . Alcohol use: No    Frequency: Never  . Drug use: No  . Sexual activity: Not Currently  Lifestyle  . Physical activity:    Days per week: 5 days    Minutes per session: Not on file  . Stress: Only a little  Relationships  . Social connections:    Talks on phone: Not on file    Gets together: Not on file    Attends religious service: Not on file    Active member of club or organization: Not on file    Attends meetings of clubs or organizations: Not on file    Relationship status: Not on file  . Intimate partner violence:    Fear of current or ex partner: Not on file    Emotionally abused: Not on file    Physically abused: Not on file    Forced sexual activity: Not on file  Other Topics Concern  . Not on file  Social History Narrative  . Not on file   Family History:  Family History  Problem Relation Age of Onset  . Hypertension Daughter   . Stroke Mother   . Cancer Neg Hx     Review of Systems: Constitutional: Denies fevers, chills or abnormal weight loss Eyes: Denies blurriness of vision Ears, nose, mouth, throat, and face: Denies mucositis or sore throat Respiratory: Denies cough, dyspnea or wheezes Cardiovascular: Denies palpitation, chest discomfort or lower extremity swelling Gastrointestinal:  Denies nausea, constipation, diarrhea GU: Denies dysuria or incontinence Skin: Denies abnormal skin rashes Neurological: Per HPI Musculoskeletal: Denies joint pain, back or neck discomfort. No decrease in ROM Behavioral/Psych: Denies anxiety, disturbance in thought content, and mood instability  Physical Exam: Vitals:   11/07/17 0850  BP: 135/87  Pulse: 100  Resp: 18  Temp: 98.8 F (37.1 C)  SpO2: 97%   KPS: 80. General: Alert, cooperative, pleasant, in no acute distress Head: Craniotomy scar noted, dry and intact. EENT: No conjunctival injection or scleral icterus. Oral mucosa moist Lungs: Resp effort normal Cardiac: Regular rate and  rhythm Abdomen: Soft, non-distended abdomen Skin: No rashes cyanosis or petechiae. Extremities: No clubbing or edema  Neurologic Exam: Mental Status: Awake, alert, attentive to examiner. Oriented to self and environment. Language has mild impairment in fluency with intact comprehension and repetition.  Cranial Nerves: Visual acuity is grossly normal. Visual fields are full. Extra-ocular movements intact. No ptosis. Face is symmetric, tongue midline. Motor: Tone and bulk are normal. Right arm and leg 4+/5, left side 5/5 power. Reflexes are symmetric, no pathologic reflexes present. Intact finger to nose bilaterally Sensory: Intact to light touch and temperature Gait: Independent but purposeful and wide based   Labs: I have reviewed the data as listed    Component Value Date/Time   NA 140 11/07/2017 0828   NA 136 (A) 10/18/2017   K 4.1 11/07/2017 0828   CL 102 11/07/2017 0737  CO2 25 11/07/2017 0828   GLUCOSE 183 (H) 11/07/2017 0828   BUN 12 11/07/2017 0828   BUN 19 10/18/2017   CREATININE 0.92 11/07/2017 0828   CALCIUM 9.1 11/07/2017 0828   PROT 6.5 11/07/2017 0828   ALBUMIN 2.9 (L) 11/07/2017 0828   AST 19 11/07/2017 0828   ALT 36 11/07/2017 0828   ALKPHOS 76 11/07/2017 0828   BILITOT 0.9 11/07/2017 0828   GFRNONAA >60 11/07/2017 0828   GFRAA >60 11/07/2017 0828   Lab Results  Component Value Date   WBC 8.8 11/07/2017   NEUTROABS 6.7 (H) 11/07/2017   HGB 12.0 (L) 11/07/2017   HCT 36.1 (L) 11/07/2017   MCV 97.8 11/07/2017   PLT 127 (L) 11/07/2017    Assessment/Plan 1. Glioblastoma ([facility])  2. Seizure [facility])  Mr. Bogle is clinically stable following his recent admission for complex UTI.  We reviewed updated molecular data regarding MGMT methylation status and its clinical implications.  Still pending data on IDH1 and EGFR amplification.     We recommended formally initiating daily concurrent temozolomide once his antibiotic course is completed at the end of this week.   His mild thrombocytopenia from infection has resolved.    Chemotherapy should be held for the following:  ANC less than 1,000  Platelets less than 100,000  LFT or creatinine greater than 2x ULN  If clinical concerns/contraindications develop  He should continue current dose of Keppra for seizure prevention.  Decadron should be decreased to '2mg'$  daily for one week, then '1mg'$  daily thereafter.   We appreciate the opportunity to participate in the care of Christopher Morris.  He should return to clinic in 2 weeks, during week 4 of radiation with a set of labs for evaluation.  All questions were answered. The patient knows to call the clinic with any problems, questions or concerns. No barriers to learning were detected.  The total time spent in the encounter was 40 minutes and more than 50% was on counseling and review of test results   Ventura Sellers, MD Medical Director of Neuro-Oncology [facility] [place] 11/07/17 11:32 AM

## 2017-11-08 ENCOUNTER — Other Ambulatory Visit (HOSPITAL_COMMUNITY)
Admission: RE | Admit: 2017-11-08 | Discharge: 2017-11-08 | Disposition: A | Discharge status: Home / Self Care | Payer: BLUE CROSS/BLUE SHIELD | Source: Ambulatory Visit | Attending: Internal Medicine | Admitting: Internal Medicine

## 2017-11-08 ENCOUNTER — Ambulatory Visit
Admission: RE | Admit: 2017-11-08 | Discharge: 2017-11-08 | Disposition: A | Discharge status: Home / Self Care | Payer: BLUE CROSS/BLUE SHIELD | Source: Ambulatory Visit | Attending: Radiation Oncology | Admitting: Radiation Oncology

## 2017-11-08 DIAGNOSIS — C719 Malignant neoplasm of brain, unspecified: Secondary | ICD-10-CM | POA: Diagnosis present

## 2017-11-08 DIAGNOSIS — C711 Malignant neoplasm of frontal lobe: Secondary | ICD-10-CM | POA: Diagnosis not present

## 2017-11-09 ENCOUNTER — Encounter (HOSPITAL_COMMUNITY): Payer: Self-pay

## 2017-11-09 ENCOUNTER — Other Ambulatory Visit: Payer: Self-pay

## 2017-11-09 ENCOUNTER — Inpatient Hospital Stay (HOSPITAL_COMMUNITY)
Admission: EM | Admit: 2017-11-09 | Discharge: 2017-11-12 | DRG: 871 | Disposition: A | Discharge status: Home / Self Care | Payer: Medicare Other | Attending: Internal Medicine | Admitting: Internal Medicine

## 2017-11-09 ENCOUNTER — Emergency Department (HOSPITAL_COMMUNITY): Payer: Medicare Other

## 2017-11-09 DIAGNOSIS — Y95 Nosocomial condition: Secondary | ICD-10-CM | POA: Diagnosis present

## 2017-11-09 DIAGNOSIS — E872 Acidosis: Secondary | ICD-10-CM | POA: Diagnosis present

## 2017-11-09 DIAGNOSIS — J849 Interstitial pulmonary disease, unspecified: Secondary | ICD-10-CM | POA: Diagnosis not present

## 2017-11-09 DIAGNOSIS — I6789 Other cerebrovascular disease: Secondary | ICD-10-CM | POA: Diagnosis not present

## 2017-11-09 DIAGNOSIS — J189 Pneumonia, unspecified organism: Secondary | ICD-10-CM | POA: Diagnosis present

## 2017-11-09 DIAGNOSIS — R402362 Coma scale, best motor response, obeys commands, at arrival to emergency department: Secondary | ICD-10-CM | POA: Diagnosis present

## 2017-11-09 DIAGNOSIS — Z7952 Long term (current) use of systemic steroids: Secondary | ICD-10-CM | POA: Diagnosis not present

## 2017-11-09 DIAGNOSIS — Z85841 Personal history of malignant neoplasm of brain: Secondary | ICD-10-CM | POA: Diagnosis not present

## 2017-11-09 DIAGNOSIS — R402252 Coma scale, best verbal response, oriented, at arrival to emergency department: Secondary | ICD-10-CM | POA: Diagnosis present

## 2017-11-09 DIAGNOSIS — Z91013 Allergy to seafood: Secondary | ICD-10-CM | POA: Diagnosis not present

## 2017-11-09 DIAGNOSIS — R251 Tremor, unspecified: Secondary | ICD-10-CM | POA: Diagnosis not present

## 2017-11-09 DIAGNOSIS — Z09 Encounter for follow-up examination after completed treatment for conditions other than malignant neoplasm: Secondary | ICD-10-CM

## 2017-11-09 DIAGNOSIS — A419 Sepsis, unspecified organism: Principal | ICD-10-CM

## 2017-11-09 DIAGNOSIS — C711 Malignant neoplasm of frontal lobe: Secondary | ICD-10-CM | POA: Diagnosis present

## 2017-11-09 DIAGNOSIS — R402142 Coma scale, eyes open, spontaneous, at arrival to emergency department: Secondary | ICD-10-CM | POA: Diagnosis present

## 2017-11-09 DIAGNOSIS — R000 Tachycardia, unspecified: Secondary | ICD-10-CM | POA: Diagnosis not present

## 2017-11-09 DIAGNOSIS — Z923 Personal history of irradiation: Secondary | ICD-10-CM | POA: Diagnosis not present

## 2017-11-09 DIAGNOSIS — R569 Unspecified convulsions: Secondary | ICD-10-CM | POA: Diagnosis present

## 2017-11-09 HISTORY — DX: Malignant neoplasm of brain, unspecified: C71.9

## 2017-11-09 LAB — COMPREHENSIVE METABOLIC PANEL
ALBUMIN: 2.7 g/dL — AB (ref 3.5–5.0)
ALT: 43 U/L (ref 17–63)
AST: 28 U/L (ref 15–41)
Alkaline Phosphatase: 77 U/L (ref 38–126)
Anion gap: 14 (ref 5–15)
BUN: 17 mg/dL (ref 6–20)
CHLORIDE: 101 mmol/L (ref 101–111)
CO2: 22 mmol/L (ref 22–32)
CREATININE: 1.03 mg/dL (ref 0.61–1.24)
Calcium: 8.6 mg/dL — ABNORMAL LOW (ref 8.9–10.3)
GFR calc non Af Amer: >60 mL/min (ref >60)
Glucose, Bld: 178 mg/dL — ABNORMAL HIGH (ref 65–99)
Potassium: 3.9 mmol/L (ref 3.5–5.1)
Sodium: 137 mmol/L (ref 135–145)
Total Bilirubin: 0.8 mg/dL (ref 0.3–1.2)
Total Protein: 5.9 g/dL — ABNORMAL LOW (ref 6.5–8.1)

## 2017-11-09 LAB — CBC WITH DIFFERENTIAL/PLATELET
Basophils Absolute: 0 10*3/uL (ref 0.0–0.1)
Basophils Relative: 0 %
EOS ABS: 0 10*3/uL (ref 0.0–0.7)
Eosinophils Relative: 1 %
HEMATOCRIT: 32.4 % — AB (ref 39.0–52.0)
Hemoglobin: 10.7 g/dL — ABNORMAL LOW (ref 13.0–17.0)
LYMPHS ABS: 0.7 10*3/uL (ref 0.7–4.0)
Lymphocytes Relative: 11 %
MCH: 31.8 pg (ref 26.0–34.0)
MCHC: 33 g/dL (ref 30.0–36.0)
MCV: 96.1 fL (ref 78.0–100.0)
MONOS PCT: 3 %
Monocytes Absolute: 0.2 10*3/uL (ref 0.1–1.0)
Neutro Abs: 5.6 10*3/uL (ref 1.7–7.7)
Neutrophils Relative %: 85 %
Platelets: 137 10*3/uL — ABNORMAL LOW (ref 150–400)
RBC: 3.37 MIL/uL — ABNORMAL LOW (ref 4.22–5.81)
RDW: 13 % (ref 11.5–15.5)
WBC: 6.6 10*3/uL (ref 4.0–10.5)

## 2017-11-09 LAB — I-STAT CG4 LACTIC ACID, ED
LACTIC ACID, VENOUS: 6.14 mmol/L — AB (ref 0.5–1.9)
Lactic Acid, Venous: 2.45 mmol/L (ref 0.5–1.9)

## 2017-11-09 LAB — URINALYSIS, ROUTINE W REFLEX MICROSCOPIC
BILIRUBIN URINE: NEGATIVE
Glucose, UA: NEGATIVE mg/dL
HGB URINE DIPSTICK: NEGATIVE
Ketones, ur: NEGATIVE mg/dL
Leukocytes, UA: NEGATIVE
Nitrite: NEGATIVE
PH: 6 (ref 5.0–8.0)
Protein, ur: NEGATIVE mg/dL
Specific Gravity, Urine: 1.01 (ref 1.005–1.030)

## 2017-11-09 LAB — INFLUENZA PANEL BY PCR (TYPE A & B)
Influenza A By PCR: NEGATIVE
Influenza B By PCR: NEGATIVE

## 2017-11-09 MED ORDER — KCL IN DEXTROSE-NACL 20-5-0.45 MEQ/L-%-% IV SOLN
INTRAVENOUS | Status: DC
  Administered 2017-11-09 – 2017-11-10 (×2): via INTRAVENOUS
  Filled 2017-11-09 (×2): qty 1000

## 2017-11-09 MED ORDER — DEXAMETHASONE 2 MG PO TABS
2.0000 mg | ORAL_TABLET | Freq: Every day | ORAL | Status: DC
  Administered 2017-11-10 – 2017-11-12 (×3): 2 mg via ORAL
  Filled 2017-11-09 (×3): qty 1

## 2017-11-09 MED ORDER — CEFEPIME HCL 1 G IJ SOLR
1.0000 g | Freq: Three times a day (TID) | INTRAMUSCULAR | Status: DC
  Administered 2017-11-09 – 2017-11-11 (×5): 1 g via INTRAVENOUS
  Filled 2017-11-09 (×6): qty 1

## 2017-11-09 MED ORDER — LEVETIRACETAM 500 MG PO TABS
500.0000 mg | ORAL_TABLET | Freq: Two times a day (BID) | ORAL | Status: DC
  Administered 2017-11-09 – 2017-11-12 (×6): 500 mg via ORAL
  Filled 2017-11-09 (×6): qty 1

## 2017-11-09 MED ORDER — SODIUM CHLORIDE 0.9 % IV BOLUS (SEPSIS)
1000.0000 mL | Freq: Once | INTRAVENOUS | Status: AC
  Administered 2017-11-09: 1000 mL via INTRAVENOUS

## 2017-11-09 MED ORDER — SODIUM CHLORIDE 0.9 % IV SOLN
1500.0000 mg | INTRAVENOUS | Status: DC
  Administered 2017-11-10 (×2): 1500 mg via INTRAVENOUS
  Filled 2017-11-09 (×2): qty 1500

## 2017-11-09 MED ORDER — ENSURE ENLIVE PO LIQD
237.0000 mL | Freq: Two times a day (BID) | ORAL | Status: DC
  Administered 2017-11-10 – 2017-11-12 (×4): 237 mL via ORAL

## 2017-11-09 MED ORDER — PIPERACILLIN-TAZOBACTAM 3.375 G IVPB 30 MIN
3.3750 g | Freq: Once | INTRAVENOUS | Status: AC
  Administered 2017-11-09: 3.375 g via INTRAVENOUS
  Filled 2017-11-09: qty 50

## 2017-11-09 MED ORDER — SODIUM CHLORIDE 0.9 % IV SOLN
1000.0000 mL | INTRAVENOUS | Status: DC
  Administered 2017-11-10 – 2017-11-11 (×2): 1000 mL via INTRAVENOUS

## 2017-11-09 MED ORDER — VANCOMYCIN HCL IN DEXTROSE 1-5 GM/200ML-% IV SOLN
1000.0000 mg | Freq: Once | INTRAVENOUS | Status: AC
  Administered 2017-11-09: 1000 mg via INTRAVENOUS
  Filled 2017-11-09: qty 200

## 2017-11-09 MED ORDER — ONDANSETRON HCL 4 MG PO TABS: 8.0000 mg | ORAL_TABLET | Freq: Two times a day (BID) | ORAL | Status: DC | PRN

## 2017-11-09 MED ORDER — DEXAMETHASONE 4 MG PO TABS: 4.0000 mg | ORAL_TABLET | Freq: Two times a day (BID) | ORAL | Status: DC

## 2017-11-09 NOTE — Progress Notes (Signed)
Pharmacy Antibiotic Note  Christopher Morris is a 77 y.o. male admitted on 11/09/2017 with sepsis secondary to pneumonia. Pharmacy has been consulted for Vancomycin and Cefepime dosing. Patient not neutropenic.   Plan: Vancomycin 1500mg  IV q24h-start 4/28 at 0000 since patient did not receive loading dose in ED. Plan for Vancomycin levels at steady state, as indicated. Cefepime 1g IV q8h. Monitor renal function, cultures, clinical course.   Height: 6\' 4"  (193 cm) Weight: 185 lb (83.9 kg) IBW/kg (Calculated) : 86.8  Temp (24hrs), Avg:102.6 F (39.2 C), Min:102.6 F (39.2 C), Max:102.6 F (39.2 C)  Recent Labs  Lab 11/07/17 0828 11/09/17 1207 11/09/17 1242 11/09/17 1448  WBC 8.8 6.6  --   --   CREATININE 0.92 1.03  --   --   LATICACIDVEN  --   --  6.14* 2.45*    Estimated Creatinine Clearance: 69 mL/min (by C-G formula based on SCr of 1.03 mg/dL).    Allergies  Allergen Reactions  . Pork-Derived Products     Patient does not eat pork  . Shellfish Allergy     Patient does not eat shellfish    Antimicrobials this admission: 4/27 >> Zosyn x 1 4/27 >> Vancomycin >> 4/27 >> Cefepime >>  Dose adjustments this admission: --  Microbiology results: 4/27 BCx: sent 4/27 UCx: sent  4/27 Influenza panel: neg 4/27 Strep pneumo ur ag: ordered 4/27 Legionella ur ag: ordered 4/27 Respiratory panel: ordered 4/27 HIV antibody: ordered   Thank you for allowing pharmacy to be a part of this patient's care.   Lindell Spar, PharmD, BCPS Pager: [phone] 11/09/2017 4:43 PM

## 2017-11-09 NOTE — ED Triage Notes (Signed)
Pt comes from home, Pt is AOx4 and Ambulatory. Pt has had a recent diagnosis of Brain cancer within the past 4 weeks. Pt is currently on medicine for seizures r/t brain cancer. Pt main complaint is Tremors and Fever. Tremors started this morning. No other complaints.   EMS checked Temp and pt did have Temp. EMS gave 1000 tylenol in route to [facility].

## 2017-11-09 NOTE — ED Notes (Signed)
ED TO INPATIENT HANDOFF REPORT  Name/Age/Gender Christopher Morris 77 y.o. male  Code Status Code Status History    Date Active Date Inactive Code Status Order ID Comments User Context   10/25/2017 0916 10/28/2017 1903 Full Code [id number]  Elodia Florence., MD ED   09/25/2017 1618 10/04/2017 2106 Full Code [id number]  [name], NP ED      Home/SNF/Other Home  Chief Complaint tremors, fever  Level of Care/Admitting Diagnosis ED Disposition    ED Disposition Condition [facility]: [facility] [[id number]]  Level of Care: Telemetry [5]  Admit to tele based on following criteria: Other see comments  Comments: sepsis with acidosis  Diagnosis: Sepsis [facility]) [[id number]]  Admitting Physician: Velvet Bathe [[id number]]  Attending Physician: Velvet Bathe [phone]  Estimated length of stay: 3 - 4 days  Certification:: I certify this patient will need inpatient services for at least 2 midnights  PT Class (Do Not Modify): Inpatient [101]  PT Acc Code (Do Not Modify): Private [1]       Medical History Past Medical History:  Diagnosis Date  . Brain cancer ([facility])   . Glioblastoma ([place]) 09/25/2017  . Hyperglycemia 09/25/2017  . Seizure ([facility]) 09/25/2017    Allergies Allergies  Allergen Reactions  . Pork-Derived Products     Patient does not eat pork  . Shellfish Allergy     Patient does not eat shellfish    IV Location/Drains/Wounds Patient Lines/Drains/Airways Status   Active Line/Drains/Airways    Name:   Placement date:   Placement time:   Site:   Days:   Peripheral IV 11/09/17 Right Antecubital   11/09/17    1248    Antecubital   less than 1   Peripheral IV 11/09/17 Right;Posterior;Distal Arm   11/09/17    1249    Arm   less than 1          Labs/Imaging Results for orders placed or performed during the hospital encounter of 11/09/17 (from the past 48 hour(s))  Comprehensive metabolic panel     Status: Abnormal   Collection Time:  11/09/17 12:07 PM  Result Value Ref Range   Sodium 137 135 - 145 mmol/L   Potassium 3.9 3.5 - 5.1 mmol/L   Chloride 101 101 - 111 mmol/L   CO2 22 22 - 32 mmol/L   Glucose, Bld 178 (H) 65 - 99 mg/dL   BUN 17 6 - 20 mg/dL   Creatinine, Ser 1.03 0.61 - 1.24 mg/dL   Calcium 8.6 (L) 8.9 - 10.3 mg/dL   Total Protein 5.9 (L) 6.5 - 8.1 g/dL   Albumin 2.7 (L) 3.5 - 5.0 g/dL   AST 28 15 - 41 U/L   ALT 43 17 - 63 U/L   Alkaline Phosphatase 77 38 - 126 U/L   Total Bilirubin 0.8 0.3 - 1.2 mg/dL   GFR calc non Af Amer >60 >60 mL/min   GFR calc Af Amer >60 >60 mL/min    Comment: (NOTE) The eGFR has been calculated using the CKD EPI equation. This calculation has not been validated in all clinical situations. eGFR's persistently <60 mL/min signify possible Chronic Kidney Disease.    Anion gap 14 5 - 15    Comment: Performed at [facility], [place] [address], [place], [place] [address]  CBC WITH DIFFERENTIAL     Status: Abnormal   Collection Time: 11/09/17 12:07 PM  Result Value Ref Range   WBC 6.6  4.0 - 10.5 K/uL   RBC 3.37 (L) 4.22 - 5.81 MIL/uL   Hemoglobin 10.7 (L) 13.0 - 17.0 g/dL   HCT 32.4 (L) 39.0 - 52.0 %   MCV 96.1 78.0 - 100.0 fL   MCH 31.8 26.0 - 34.0 pg   MCHC 33.0 30.0 - 36.0 g/dL   RDW 13.0 11.5 - 15.5 %   Platelets 137 (L) 150 - 400 K/uL   Neutrophils Relative % 85 %   Neutro Abs 5.6 1.7 - 7.7 K/uL   Lymphocytes Relative 11 %   Lymphs Abs 0.7 0.7 - 4.0 K/uL   Monocytes Relative 3 %   Monocytes Absolute 0.2 0.1 - 1.0 K/uL   Eosinophils Relative 1 %   Eosinophils Absolute 0.0 0.0 - 0.7 K/uL   Basophils Relative 0 %   Basophils Absolute 0.0 0.0 - 0.1 K/uL    Comment: Performed at [facility], [place] [address] [place], [place] [address]  I-Stat CG4 Lactic Acid, ED  (not at  [facility])     Status: Abnormal   Collection Time: 11/09/17 12:42 PM  Result Value Ref Range   Lactic Acid, Venous 6.14 (HH) 0.5 - 1.9 mmol/L   Comment NOTIFIED  PHYSICIAN   Influenza panel by PCR (type A & B)     Status: None   Collection Time: 11/09/17  2:14 PM  Result Value Ref Range   Influenza A By PCR NEGATIVE NEGATIVE   Influenza B By PCR NEGATIVE NEGATIVE    Comment: (NOTE) The Xpert Xpress Flu assay is intended as an aid in the diagnosis of  influenza and should not be used as a sole basis for treatment.  This  assay is FDA approved for nasopharyngeal swab specimens only. Nasal  washings and aspirates are unacceptable for Xpert Xpress Flu testing. Performed at [facility], [place] [address] [place], [place] [address]   I-Stat CG4 Lactic Acid, ED  (not at  [facility])     Status: Abnormal   Collection Time: 11/09/17  2:48 PM  Result Value Ref Range   Lactic Acid, Venous 2.45 (HH) 0.5 - 1.9 mmol/L   Comment NOTIFIED PHYSICIAN   Urinalysis, Routine w reflex microscopic     Status: None   Collection Time: 11/09/17  3:21 PM  Result Value Ref Range   Color, Urine YELLOW YELLOW   APPearance CLEAR CLEAR   Specific Gravity, Urine 1.010 1.005 - 1.030   pH 6.0 5.0 - 8.0   Glucose, UA NEGATIVE NEGATIVE mg/dL   Hgb urine dipstick NEGATIVE NEGATIVE   Bilirubin Urine NEGATIVE NEGATIVE   Ketones, ur NEGATIVE NEGATIVE mg/dL   Protein, ur NEGATIVE NEGATIVE mg/dL   Nitrite NEGATIVE NEGATIVE   Leukocytes, UA NEGATIVE NEGATIVE    Comment: Performed at [facility] [address], [place], [place] [address]   Dg Chest 2 View  Result Date: 11/09/2017 CLINICAL DATA:  Brain cancer.  Fever. EXAM: CHEST - 2 VIEW COMPARISON:  October 25, 2017 FINDINGS: Increased interstitial markings in the lungs. The heart size borderline. The hila and mediastinum are normal. No pulmonary nodules or masses. No focal infiltrates. IMPRESSION: Increasing interstitial opacities in the lungs could represent atypical infection or edema. Electronically Signed   By: Dorise Bullion III M.D   On: 11/09/2017 13:19    Pending Labs Unresulted Labs  (From admission, onward)   Start     Ordered   11/09/17 1207  Blood Culture (routine x 2)  BLOOD CULTURE X  2,   STAT     11/09/17 1206   11/09/17 1207  Urine culture  STAT,   STAT     11/09/17 1206   Signed and Held  Culture, blood (routine x 2) Call MD if unable to obtain prior to antibiotics being given  BLOOD CULTURE X 2,   R    Comments:  If blood cultures drawn in Emergency Department - Do not draw and cancel order    Signed and Held   Signed and Held  Culture, sputum-assessment  Once,   R     Signed and Held   Signed and Held  Gram stain  Once,   R     Signed and Held   Signed and Held  HIV antibody (Routine Screening)  Once,   R     Signed and Held   Signed and Held  Strep pneumoniae urinary antigen  Once,   R     Signed and Held   Signed and Held  Legionella Pneumophila Serogp 1 Ur Ag  Once,   R     Signed and Held   Signed and Held  Respiratory Panel by PCR  (Respiratory virus panel)  Once,   R     Signed and Held      Vitals/Pain Today's Vitals   11/09/17 1308 11/09/17 1400 11/09/17 1449 11/09/17 1533  BP: 102/61 102/61 111/68   Pulse: 96 87 71   Resp: (!) '21 19 18   '$ Temp:      TempSrc:      SpO2: 96% 96% 95%   Weight:      Height:      PainSc:    0-No pain    Isolation Precautions No active isolations  Medications Medications  0.9 %  sodium chloride infusion (has no administration in time range)  ceFEPIme (MAXIPIME) 1 g in sodium chloride 0.9 % 100 mL IVPB (has no administration in time range)  vancomycin (VANCOCIN) 1,500 mg in sodium chloride 0.9 % 500 mL IVPB (has no administration in time range)  sodium chloride 0.9 % bolus 1,000 mL (0 mLs Intravenous Stopped 11/09/17 1412)    And  sodium chloride 0.9 % bolus 1,000 mL (0 mLs Intravenous Stopped 11/09/17 1412)    And  sodium chloride 0.9 % bolus 1,000 mL (0 mLs Intravenous Stopped 11/09/17 1443)  piperacillin-tazobactam (ZOSYN) IVPB 3.375 g (0 g Intravenous Stopped 11/09/17 1412)  vancomycin (VANCOCIN)  IVPB 1000 mg/200 mL premix (0 mg Intravenous Stopped 11/09/17 1412)    Mobility non-ambulatory

## 2017-11-09 NOTE — ED Triage Notes (Signed)
Pt is will be receiving treatment via [facility].

## 2017-11-09 NOTE — Progress Notes (Signed)
Per lab, able to add-on strep pneumo urine, legionella urine & resp virus panel swab to previously collected samples.

## 2017-11-09 NOTE — ED Notes (Signed)
ED Provider at bedside. 

## 2017-11-09 NOTE — Progress Notes (Signed)
A consult was received from an ED physician for Vancomycin and Zosyn per pharmacy dosing.  The patient's profile has been reviewed for ht/wt/allergies/indication/available labs.    A one time order has already been placed by provider for Vancomycin 1g IV and Zosyn 3.375g IV (30 min infusion).  Further antibiotics/pharmacy consults should be ordered by admitting physician if indicated.                       Thank you, Luiz Ochoa 11/09/2017  1:00 PM

## 2017-11-09 NOTE — ED Notes (Signed)
Patient transported to X-ray 

## 2017-11-09 NOTE — H&P (Signed)
History and Physical    Mattthew Ziomek ZHY:[id number] DOB: Apr 01, 1941 DOA: 11/09/2017  PCP: Patient, No Pcp Per  Patient coming from: home  Chief Complaint: Chills  HPI: Gradie Ohm is a 77 y.o. male with medical history significant of glioblastoma on Decadron and history of seizures.  Patient presented to the hospital complaining 1 day complaint of chills.  Otherwise denies symptoms related to pneumonia unlike productive cough, difficulty breathing, and/or fevers.  The only thing he admits he was chills which occurred today.  The problem was insidious and given its persistence he presented to the hospital for further evaluation recommendations.  ED Course: Patient was found to have lactic acidosis and sepsis secondary to pneumonia chest x-ray findings.  We are subsequently consulted for further medical evaluation recommendations.  Review of Systems: As per HPI otherwise 10 point review of systems negative.    Past Medical History:  Diagnosis Date  . Brain cancer ([place])   . Glioblastoma ([place]) 09/25/2017  . Hyperglycemia 09/25/2017  . Seizure ([place]) 09/25/2017    Past Surgical History:  Procedure Laterality Date  . APPLICATION OF CRANIAL NAVIGATION N/A 09/30/2017   Procedure: APPLICATION OF CRANIAL NAVIGATION;  Surgeon: [name], MD;  Location: [facility];  Service: Neurosurgery;  Laterality: N/A;  . CRANIOTOMY N/A 09/30/2017   Procedure: CRANIOTOMY FOR RESECTION OF BRAIN TUMOR WITH STEREOTACTIC;  Surgeon: [name], MD;  Location: [facility];  Service: Neurosurgery;  Laterality: N/A;  CRANIOTOMY FOR RESECTION OF BRAIN TUMOR WITH STEREOTACTIC     reports that he has never smoked. He has never used smokeless tobacco. He reports that he does not drink alcohol or use drugs.  Allergies  Allergen Reactions  . Pork-Derived Products     Patient does not eat pork  . Shellfish Allergy     Patient does not eat shellfish    Family History  Problem Relation Age of Onset  .  Hypertension Daughter   . Stroke Mother   . Cancer Neg Hx     Prior to Admission medications   Medication Sig Start Date End Date Taking? Authorizing Provider  ciprofloxacin (CIPRO) 500 MG tablet Take 1 tablet (500 mg total) by mouth 2 (two) times daily for 14 days. 10/28/17 11/11/17 Yes Hosie Poisson, MD  dexamethasone (DECADRON) 4 MG tablet Take 1 tablet (4 mg total) by mouth 2 (two) times daily. 10/17/17  Yes Vaslow, Acey Lav, MD  levETIRAcetam (KEPPRA) 500 MG tablet Take 1 tablet (500 mg total) by mouth 2 (two) times daily. 10/17/17  Yes Vaslow, Acey Lav, MD  ondansetron (ZOFRAN) 8 MG tablet Take 1 tablet (8 mg total) by mouth 2 (two) times daily as needed. Start on the third day after chemotherapy. 11/07/17  Yes Vaslow, Acey Lav, MD  temozolomide (TEMODAR) 140 MG capsule Take 1 capsule (140mg ) by mouth once daily. Take with 20mg  capsule for 160mg  total daily dose. May take on an empty stomach to decrease N&V 10/24/17  Yes Vaslow, Acey Lav, MD  temozolomide (TEMODAR) 20 MG capsule Take 1 capsule (20mg ) by mouth once daily. Take with 140mg  capsule for 160mg  total daily dose. May take on an empty stomach to decrease N&V 10/24/17  Yes Vaslow, Acey Lav, MD  acetaminophen (TYLENOL) 325 MG tablet Take 2 tablets (650 mg total) by mouth every 6 (six) hours as needed for mild pain (or Fever >/= 101). Patient not taking: Reported on 10/25/2017 10/04/17   Velvet Bathe, MD    Physical Exam: Vitals:   11/09/17 1230 11/09/17 1308  11/09/17 1400 11/09/17 1449  BP: 108/61 102/61 102/61 111/68  Pulse: (!) 104 96 87 71  Resp: (!) 22 (!) 21 19 18   Temp:      TempSrc:      SpO2: 97% 96% 96% 95%  Weight:      Height:        Constitutional: NAD, calm, comfortable Vitals:   11/09/17 1230 11/09/17 1308 11/09/17 1400 11/09/17 1449  BP: 108/61 102/61 102/61 111/68  Pulse: (!) 104 96 87 71  Resp: (!) 22 (!) 21 19 18   Temp:      TempSrc:      SpO2: 97% 96% 96% 95%  Weight:      Height:       Eyes: PERRL,  lids and conjunctivae normal ENMT: Mucous membranes are moist. Posterior pharynx clear of any exudate or lesions.Normal dentition.  Neck: normal, supple, no masses, no thyromegaly Respiratory: Equal chest rise, no increased work of breathing, no accessory muscle use, no wheezes Cardiovascular: Regular rate and rhythm, no murmurs / rubs / gallops. No extremity edema. Abdomen: no tenderness, no masses palpated. No hepatosplenomegaly. Bowel sounds positive.  Musculoskeletal: no clubbing / cyanosis. No joint deformity upper and lower extremities.  Skin: no rashes, lesions, ulcers. No induration, on limited exam Neurologic: Patient is awake and alert, answers questions appropriately, hearing intact, moves extremities equally Psychiatric: Normal judgment and insight. Normal mood.   Labs on Admission: I have personally reviewed following labs and imaging studies  CBC: Recent Labs  Lab 11/07/17 0828 11/09/17 1207  WBC 8.8 6.6  NEUTROABS 6.7* 5.6  HGB 12.0* 10.7*  HCT 36.1* 32.4*  MCV 97.8 96.1  PLT 127* 932*   Basic Metabolic Panel: Recent Labs  Lab 11/07/17 0828 11/09/17 1207  NA 140 137  K 4.1 3.9  CL 102 101  CO2 25 22  GLUCOSE 183* 178*  BUN 12 17  CREATININE 0.92 1.03  CALCIUM 9.1 8.6*   GFR: Estimated Creatinine Clearance: 69 mL/min (by C-G formula based on SCr of 1.03 mg/dL). Liver Function Tests: Recent Labs  Lab 11/07/17 0828 11/09/17 1207  AST 19 28  ALT 36 43  ALKPHOS 76 77  BILITOT 0.9 0.8  PROT 6.5 5.9*  ALBUMIN 2.9* 2.7*   No results for input(s): LIPASE, AMYLASE in the last 168 hours. No results for input(s): AMMONIA in the last 168 hours. Coagulation Profile: No results for input(s): INR, PROTIME in the last 168 hours. Cardiac Enzymes: No results for input(s): CKTOTAL, CKMB, CKMBINDEX, TROPONINI in the last 168 hours. BNP (last 3 results) No results for input(s): PROBNP in the last 8760 hours. HbA1C: No results for input(s): HGBA1C in the last 72  hours. CBG: No results for input(s): GLUCAP in the last 168 hours. Lipid Profile: No results for input(s): CHOL, HDL, LDLCALC, TRIG, CHOLHDL, LDLDIRECT in the last 72 hours. Thyroid Function Tests: No results for input(s): TSH, T4TOTAL, FREET4, T3FREE, THYROIDAB in the last 72 hours. Anemia Panel: No results for input(s): VITAMINB12, FOLATE, FERRITIN, TIBC, IRON, RETICCTPCT in the last 72 hours. Urine analysis:    Component Value Date/Time   COLORURINE YELLOW 11/09/2017 [place] 11/09/2017 1521   LABSPEC 1.010 11/09/2017 1521   PHURINE 6.0 11/09/2017 1521   GLUCOSEU NEGATIVE 11/09/2017 1521   HGBUR NEGATIVE 11/09/2017 1521   BILIRUBINUR NEGATIVE 11/09/2017 1521   KETONESUR NEGATIVE 11/09/2017 1521   PROTEINUR NEGATIVE 11/09/2017 1521   NITRITE NEGATIVE 11/09/2017 1521   LEUKOCYTESUR NEGATIVE 11/09/2017 1521  Radiological Exams on Admission: Dg Chest 2 View  Result Date: 11/09/2017 CLINICAL DATA:  Brain cancer.  Fever. EXAM: CHEST - 2 VIEW COMPARISON:  October 25, 2017 FINDINGS: Increased interstitial markings in the lungs. The heart size borderline. The hila and mediastinum are normal. No pulmonary nodules or masses. No focal infiltrates. IMPRESSION: Increasing interstitial opacities in the lungs could represent atypical infection or edema. Electronically Signed   By: Dorise Bullion III M.D   On: 11/09/2017 13:19    EKG: Independently reviewed.   Assessment/Plan   Sepsis ([place]) -Secondary to pneumonia source.  Continue broad-spectrum antibiotics -Besides fever last set of vitals within normal limits and patient clinically looks stable.  As such we will transition to telemetry floor and not stepdown -Follow-up blood cultures  Pneumonia -Most likely secondary to suppressed immunosuppression from steroid use secondary to treatment of glioblastoma.  Obtain viral respiratory panel, urinary Legionella and Streptococcus antigen -Continue antibiotic regimen cefepime  and vancomycin  Lactic acidosis -resolving and secondary to principle problem - continue MIVF's  Active Problems:   Seizure ([place]) - stable continue prior to admission medication regimen    Glioblastoma ([place]) - Continue Decadron - Patient will need to follow-up with oncologist after discharge for routine follow-up recommendations  DVT prophylaxis: SCD's Code Status: full Family Communication: d/c patient and daughter at bedside Disposition Plan: pending improvement in condition Consults called: none Admission status: inpatient   Velvet Bathe MD [facility] Pager [phone]  If 7PM-7AM, please contact night-coverage [email or web address] Password [facility]  11/09/2017, 4:18 PM

## 2017-11-09 NOTE — ED Provider Notes (Signed)
[facility] DEPT Provider Note   CSN: [id number] Arrival date & time: 11/09/17  1141     History   Chief Complaint Chief Complaint  Patient presents with  . Tremors    HPI Christopher Morris is a 77 y.o. male.  This is a 77 year old male with history of brain cancer presents with acute onset of chills  which began just prior to arrival.  He is currently not undergoing chemotherapy treatment.  Patient denies any cough or congestion.  No dysuria or hematuria.  Denies abdominal chest discomfort.  No recent cold symptoms.  Denies any sick contacts.  EMS called and patient given a gram of Tylenol prior to arrival and transported here for further management.     Past Medical History:  Diagnosis Date  . Brain cancer ([place])   . Glioblastoma ([place]) 09/25/2017  . Hyperglycemia 09/25/2017  . Seizure ([place]) 09/25/2017    Patient Active Problem List   Diagnosis Date Noted  . Bacteremia due to Gram-negative bacteria 10/26/2017  . Sepsis due to Escherichia coli (E. coli) ([place]) 10/26/2017  . UTI (urinary tract infection) 10/25/2017  . Seizure ([place]) 09/25/2017  . Glioblastoma ([place]) 09/25/2017  . Hyperglycemia 09/25/2017  . Leukocytosis 09/25/2017  . Hypokalemia 09/25/2017  . BLOOD IN STOOL 07/26/2008  . ERECTILE DYSFUNCTION 07/12/2008    Past Surgical History:  Procedure Laterality Date  . APPLICATION OF CRANIAL NAVIGATION N/A 09/30/2017   Procedure: APPLICATION OF CRANIAL NAVIGATION;  Surgeon: [name], MD;  Location: [facility];  Service: Neurosurgery;  Laterality: N/A;  . CRANIOTOMY N/A 09/30/2017   Procedure: CRANIOTOMY FOR RESECTION OF BRAIN TUMOR WITH STEREOTACTIC;  Surgeon: [name], MD;  Location: [facility];  Service: Neurosurgery;  Laterality: N/A;  CRANIOTOMY FOR RESECTION OF BRAIN TUMOR WITH STEREOTACTIC        Home Medications    Prior to Admission medications   Medication Sig Start Date End Date Taking? Authorizing Provider    acetaminophen (TYLENOL) 325 MG tablet Take 2 tablets (650 mg total) by mouth every 6 (six) hours as needed for mild pain (or Fever >/= 101). Patient not taking: Reported on 10/25/2017 10/04/17   Velvet Bathe, MD  ciprofloxacin (CIPRO) 500 MG tablet Take 1 tablet (500 mg total) by mouth 2 (two) times daily for 14 days. 10/28/17 11/11/17  Hosie Poisson, MD  dexamethasone (DECADRON) 4 MG tablet Take 1 tablet (4 mg total) by mouth 2 (two) times daily. 10/17/17   Ventura Sellers, MD  levETIRAcetam (KEPPRA) 500 MG tablet Take 1 tablet (500 mg total) by mouth 2 (two) times daily. 10/17/17   Vaslow, Acey Lav, MD  ondansetron (ZOFRAN) 8 MG tablet Take 1 tablet (8 mg total) by mouth 2 (two) times daily as needed. Start on the third day after chemotherapy. 11/07/17   Ventura Sellers, MD  temozolomide (TEMODAR) 140 MG capsule Take 1 capsule (140mg ) by mouth once daily. Take with 20mg  capsule for 160mg  total daily dose. May take on an empty stomach to decrease N&V Patient not taking: Reported on 11/07/2017 10/24/17   Ventura Sellers, MD  temozolomide (TEMODAR) 20 MG capsule Take 1 capsule (20mg ) by mouth once daily. Take with 140mg  capsule for 160mg  total daily dose. May take on an empty stomach to decrease N&V Patient not taking: Reported on 11/07/2017 10/24/17   Ventura Sellers, MD    Family History Family History  Problem Relation Age of Onset  . Hypertension Daughter   . Stroke Mother   .  Cancer Neg Hx     Social History Social History   Tobacco Use  . Smoking status: Never Smoker  . Smokeless tobacco: Never Used  Substance Use Topics  . Alcohol use: No    Frequency: Never  . Drug use: No     Allergies   Pork-derived products and Shellfish allergy   Review of Systems Review of Systems  All other systems reviewed and are negative.    Physical Exam Updated Vital Signs BP 112/63 (BP Location: Left Arm)   Pulse (!) 109   Temp (!) 102.6 F (39.2 C) (Rectal)   Resp 18   Ht 1.93 m  (6\' 4" )   Wt 83.9 kg (185 lb)   SpO2 96%   BMI 22.52 kg/m   Physical Exam  Constitutional: He is oriented to person, place, and time. He appears well-developed and well-nourished.  Non-toxic appearance. No distress.  HENT:  Head: Normocephalic and atraumatic.  Eyes: Pupils are equal, round, and reactive to light. Conjunctivae, EOM and lids are normal.  Neck: Normal range of motion. Neck supple. No tracheal deviation present. No thyroid mass present.  Cardiovascular: Regular rhythm and normal heart sounds. Tachycardia present. Exam reveals no gallop.  No murmur heard. Pulmonary/Chest: Effort normal and breath sounds normal. No stridor. No respiratory distress. He has no decreased breath sounds. He has no wheezes. He has no rhonchi. He has no rales.  Abdominal: Soft. Normal appearance and bowel sounds are normal. He exhibits no distension. There is no tenderness. There is no rebound and no CVA tenderness.  Musculoskeletal: Normal range of motion. He exhibits no edema or tenderness.  Neurological: He is alert and oriented to person, place, and time. He has normal strength. No cranial nerve deficit or sensory deficit. GCS eye subscore is 4. GCS verbal subscore is 5. GCS motor subscore is 6.  Skin: Skin is warm and dry. No abrasion and no rash noted.  Psychiatric: He has a normal mood and affect. His speech is normal and behavior is normal.  Nursing note and vitals reviewed.    ED Treatments / Results  Labs (all labs ordered are listed, but only abnormal results are displayed) Labs Reviewed  CULTURE, BLOOD (ROUTINE X 2)  CULTURE, BLOOD (ROUTINE X 2)  URINE CULTURE  COMPREHENSIVE METABOLIC PANEL  CBC WITH DIFFERENTIAL/PLATELET  URINALYSIS, ROUTINE W REFLEX MICROSCOPIC  I-STAT CG4 LACTIC ACID, ED    EKG EKG Interpretation  Date/Time:  Saturday November 09 2017 11:58:58 EDT Ventricular Rate:  109 PR Interval:    QRS Duration: 74 QT Interval:  333 QTC Calculation: 449 R  Axis:   20 Text Interpretation:  Sinus tachycardia Abnormal R-wave progression, early transition Left ventricular hypertrophy Confirmed by Lacretia Leigh ([id number]) on 11/09/2017 12:07:47 PM   Radiology No results found.  Procedures Procedures (including critical care time)  Medications Ordered in ED Medications  0.9 %  sodium chloride infusion (has no administration in time range)     Initial Impression / Assessment and Plan / ED Course  I have reviewed the triage vital signs and the nursing notes.  Pertinent labs & imaging results that were available during my care of the patient were reviewed by me and considered in my medical decision making (see chart for details).     Code sepsis initiated after lactate found to be above 6.  Given IV fluid bolus 30 cc/kg along with started on sepsis antibiotics without a source.  Influenza test is pending.  Will consult hospitalist for admission  CRITICAL CARE Performed by: Leota Jacobsen Total critical care time: 50 minutes Critical care time was exclusive of separately billable procedures and treating other patients. Critical care was necessary to treat or prevent imminent or life-threatening deterioration. Critical care was time spent personally by me on the following activities: development of treatment plan with patient and/or surrogate as well as nursing, discussions with consultants, evaluation of patient's response to treatment, examination of patient, obtaining history from patient or surrogate, ordering and performing treatments and interventions, ordering and review of laboratory studies, ordering and review of radiographic studies, pulse oximetry and re-evaluation of patient's condition.  Final Clinical Impressions(s) / ED Diagnoses   Final diagnoses:  None    ED Discharge Orders    None       Lacretia Leigh, MD 11/09/17 1426

## 2017-11-09 NOTE — ED Notes (Signed)
Pt cannot use restroom at this time, aware urine specimen is needed.  

## 2017-11-09 NOTE — ED Notes (Signed)
Bed: NS25 Expected date:  Expected time:  Means of arrival:  Comments: ? UTI

## 2017-11-10 DIAGNOSIS — C711 Malignant neoplasm of frontal lobe: Secondary | ICD-10-CM

## 2017-11-10 DIAGNOSIS — A419 Sepsis, unspecified organism: Principal | ICD-10-CM

## 2017-11-10 DIAGNOSIS — R569 Unspecified convulsions: Secondary | ICD-10-CM

## 2017-11-10 LAB — RESPIRATORY PANEL BY PCR
Adenovirus: NOT DETECTED
BORDETELLA PERTUSSIS-RVPCR: NOT DETECTED
Chlamydophila pneumoniae: NOT DETECTED
Coronavirus 229E: NOT DETECTED
Coronavirus HKU1: NOT DETECTED
Coronavirus NL63: NOT DETECTED
Coronavirus OC43: NOT DETECTED
INFLUENZA A-RVPPCR: NOT DETECTED
Influenza B: NOT DETECTED
METAPNEUMOVIRUS-RVPPCR: NOT DETECTED
Mycoplasma pneumoniae: NOT DETECTED
PARAINFLUENZA VIRUS 3-RVPPCR: NOT DETECTED
PARAINFLUENZA VIRUS 4-RVPPCR: NOT DETECTED
Parainfluenza Virus 1: NOT DETECTED
Parainfluenza Virus 2: NOT DETECTED
RESPIRATORY SYNCYTIAL VIRUS-RVPPCR: NOT DETECTED
RHINOVIRUS / ENTEROVIRUS - RVPPCR: NOT DETECTED

## 2017-11-10 LAB — CBC WITH DIFFERENTIAL/PLATELET
BASOS ABS: 0 10*3/uL (ref 0.0–0.1)
BASOS PCT: 0 %
Eosinophils Absolute: 0.1 10*3/uL (ref 0.0–0.7)
Eosinophils Relative: 1 %
HCT: 30.3 % — ABNORMAL LOW (ref 39.0–52.0)
HEMOGLOBIN: 9.9 g/dL — AB (ref 13.0–17.0)
LYMPHS PCT: 16 %
Lymphs Abs: 1 10*3/uL (ref 0.7–4.0)
MCH: 31.7 pg (ref 26.0–34.0)
MCHC: 32.7 g/dL (ref 30.0–36.0)
MCV: 97.1 fL (ref 78.0–100.0)
MONO ABS: 0.2 10*3/uL (ref 0.1–1.0)
Monocytes Relative: 3 %
Neutro Abs: 5.2 10*3/uL (ref 1.7–7.7)
Neutrophils Relative %: 80 %
PLATELETS: 131 10*3/uL — AB (ref 150–400)
RBC: 3.12 MIL/uL — AB (ref 4.22–5.81)
RDW: 13.3 % (ref 11.5–15.5)
WBC: 6.5 10*3/uL (ref 4.0–10.5)

## 2017-11-10 LAB — STREP PNEUMONIAE URINARY ANTIGEN: Strep Pneumo Urinary Antigen: NEGATIVE

## 2017-11-10 LAB — BASIC METABOLIC PANEL
ANION GAP: 10 (ref 5–15)
BUN: 14 mg/dL (ref 6–20)
CHLORIDE: 105 mmol/L (ref 101–111)
CO2: 26 mmol/L (ref 22–32)
Calcium: 8.6 mg/dL — ABNORMAL LOW (ref 8.9–10.3)
Creatinine, Ser: 0.77 mg/dL (ref 0.61–1.24)
Glucose, Bld: 138 mg/dL — ABNORMAL HIGH (ref 65–99)
POTASSIUM: 4.5 mmol/L (ref 3.5–5.1)
Sodium: 141 mmol/L (ref 135–145)

## 2017-11-10 LAB — URINE CULTURE: Culture: NO GROWTH

## 2017-11-10 LAB — EXPECTORATED SPUTUM ASSESSMENT W GRAM STAIN, RFLX TO RESP C

## 2017-11-10 LAB — LACTIC ACID, PLASMA: LACTIC ACID, VENOUS: 3.1 mmol/L — AB (ref 0.5–1.9)

## 2017-11-10 LAB — EXPECTORATED SPUTUM ASSESSMENT W REFEX TO RESP CULTURE

## 2017-11-10 LAB — HIV ANTIBODY (ROUTINE TESTING W REFLEX): HIV Screen 4th Generation wRfx: NONREACTIVE

## 2017-11-10 NOTE — Addendum Note (Signed)
Encounter addended by: Kyung Rudd, MD on: 11/10/2017 12:17 PM  Actions taken: Problem List modified, Visit diagnoses modified, Sign clinical note, Delete clinical note

## 2017-11-10 NOTE — Progress Notes (Signed)
PROGRESS NOTE    Christopher Morris  ACZ:[id number] DOB: [birth year] DOA: 11/09/2017 PCP: Patient, No Pcp Per   Brief Narrative:  Christopher Morris is a 77 y.o. male with medical history significant of glioblastoma on Decadron and history of seizures.  Patient presented to the hospital complaining 1 day complaint of chills.  Patient was found to have lactic acidosis and sepsis secondary to pneumonia chest x-ray findings.       Assessment & Plan:   Active Problems:   Seizure ([place])   Frontal glioblastoma multiforme (HCC)   Sepsis ([place])   Sepsis from health care associated pneumonia from immunosuppresion:  - admitted for IV antibiotics, trend lactic acid and continue with IV fluids.  - resp panel neg.  Influenza neg.  Urine strep antigen negative.    Lactic acidosis: - trend, and continue with IV fluids.    Glioblastoma:  Continue with decadron.  Outpatient follow up with oncology.    Seizures:  Resume keppra.      DVT prophylaxis: scd's Code Status: full code.  Family Communication: none at bedside.  Disposition Plan: home in 1 to 2 days.    Consultants:   None.    Procedures: none.    Antimicrobials: vancomycin and cefepime since admission.     Subjective: No new complaints.   Objective: Vitals:   11/09/17 1810 11/09/17 2119 11/10/17 0447 11/10/17 1259  BP:  121/68 136/74 (!) 143/93  Pulse:  74 77 92  Resp:  17 17 16   Temp: 97.6 F (36.4 C) 98.2 F (36.8 C) 98.8 F (37.1 C) 98.6 F (37 C)  TempSrc: Oral Oral Oral Oral  SpO2:  97% 96% 93%  Weight:      Height:        Intake/Output Summary (Last 24 hours) at 11/10/2017 1849 Last data filed at 11/10/2017 1500 Gross per 24 hour  Intake 2876.25 ml  Output 1050 ml  Net 1826.25 ml   Filed Weights   11/09/17 1155 11/09/17 1750  Weight: 83.9 kg (185 lb) 84.8 kg (186 lb 15.2 oz)    Examination:  General exam: Appears calm and comfortable  Respiratory system: Clear to auscultation. Respiratory  effort normal. Cardiovascular system: S1 & S2 heard, RRR. No JVD. No pedal edema. Gastrointestinal system: Abdomen is nondistended, soft and nontender. No organomegaly or masses felt. Normal bowel sounds heard. Central nervous system: Alert and oriented. No focal neurological deficits. Extremities: Symmetric 5 x 5 power. Skin: No rashes, lesions or ulcers Psychiatry:Mood & affect appropriate.     Data Reviewed: I have personally reviewed following labs and imaging studies  CBC: Recent Labs  Lab 11/07/17 0828 11/09/17 1207 11/10/17 1031  WBC 8.8 6.6 6.5  NEUTROABS 6.7* 5.6 5.2  HGB 12.0* 10.7* 9.9*  HCT 36.1* 32.4* 30.3*  MCV 97.8 96.1 97.1  PLT 127* 137* 109*   Basic Metabolic Panel: Recent Labs  Lab 11/07/17 0828 11/09/17 1207 11/10/17 1031  NA 140 137 141  K 4.1 3.9 4.5  CL 102 101 105  CO2 25 22 26   GLUCOSE 183* 178* 138*  BUN 12 17 14   CREATININE 0.92 1.03 0.77  CALCIUM 9.1 8.6* 8.6*   GFR: Estimated Creatinine Clearance: 89.8 mL/min (by C-G formula based on SCr of 0.77 mg/dL). Liver Function Tests: Recent Labs  Lab 11/07/17 0828 11/09/17 1207  AST 19 28  ALT 36 43  ALKPHOS 76 77  BILITOT 0.9 0.8  PROT 6.5 5.9*  ALBUMIN 2.9* 2.7*   No results for input(s): LIPASE,  AMYLASE in the last 168 hours. No results for input(s): AMMONIA in the last 168 hours. Coagulation Profile: No results for input(s): INR, PROTIME in the last 168 hours. Cardiac Enzymes: No results for input(s): CKTOTAL, CKMB, CKMBINDEX, TROPONINI in the last 168 hours. BNP (last 3 results) No results for input(s): PROBNP in the last 8760 hours. HbA1C: No results for input(s): HGBA1C in the last 72 hours. CBG: No results for input(s): GLUCAP in the last 168 hours. Lipid Profile: No results for input(s): CHOL, HDL, LDLCALC, TRIG, CHOLHDL, LDLDIRECT in the last 72 hours. Thyroid Function Tests: No results for input(s): TSH, T4TOTAL, FREET4, T3FREE, THYROIDAB in the last 72 hours. Anemia  Panel: No results for input(s): VITAMINB12, FOLATE, FERRITIN, TIBC, IRON, RETICCTPCT in the last 72 hours. Sepsis Labs: Recent Labs  Lab 11/09/17 1242 11/09/17 1448 11/10/17 1031  LATICACIDVEN 6.14* 2.45* 3.1*    Recent Results (from the past 240 hour(s))  Blood Culture (routine x 2)     Status: None (Preliminary result)   Collection Time: 11/09/17 12:10 PM  Result Value Ref Range Status   Specimen Description   Final    BLOOD BLOOD RIGHT FOREARM Performed at [facility] [address], [place], [place] [address]    Special Requests   Final    BOTTLES DRAWN AEROBIC AND ANAEROBIC Blood Culture results may not be optimal due to an excessive volume of blood received in culture bottles Performed at [facility] [address], [place], [place] [address]    Culture   Final    NO GROWTH < 24 HOURS Performed at [facility] [address] [place], [place] [address]    Report Status PENDING  Incomplete  Blood Culture (routine x 2)     Status: None (Preliminary result)   Collection Time: 11/09/17 12:12 PM  Result Value Ref Range Status   Specimen Description   Final    BLOOD RIGHT ANTECUBITAL Performed at [facility] [address] [place], [place] [address]    Special Requests   Final    BOTTLES DRAWN AEROBIC AND ANAEROBIC Blood Culture results may not be optimal due to an excessive volume of blood received in culture bottles Performed at [facility] [address] [place], [place] [address]    Culture   Final    NO GROWTH < 24 HOURS Performed at [facility] [address] [place], [place] [address]    Report Status PENDING  Incomplete  Urine culture     Status: None   Collection Time: 11/09/17  3:21 PM  Result Value Ref Range Status   Specimen Description   Final    URINE, RANDOM Performed at [facility] [address] [place], [place] [address]     Special Requests   Final    NONE Performed at [facility], [place] [address], [place], [place] [address]    Culture   Final    NO GROWTH Performed at [facility], [place] [address], [place], [place] [address]    Report Status 11/10/2017 FINAL  Final  Respiratory Panel by PCR     Status: None   Collection Time: 11/09/17  5:45 PM  Result Value Ref Range Status   Adenovirus NOT DETECTED NOT DETECTED Final   Coronavirus 229E NOT DETECTED NOT DETECTED Final   Coronavirus HKU1 NOT DETECTED NOT DETECTED Final   Coronavirus NL63 NOT DETECTED NOT DETECTED Final  Coronavirus OC43 NOT DETECTED NOT DETECTED Final   Metapneumovirus NOT DETECTED NOT DETECTED Final   Rhinovirus / Enterovirus NOT DETECTED NOT DETECTED Final   Influenza A NOT DETECTED NOT DETECTED Final   Influenza B NOT DETECTED NOT DETECTED Final   Parainfluenza Virus 1 NOT DETECTED NOT DETECTED Final   Parainfluenza Virus 2 NOT DETECTED NOT DETECTED Final   Parainfluenza Virus 3 NOT DETECTED NOT DETECTED Final   Parainfluenza Virus 4 NOT DETECTED NOT DETECTED Final   Respiratory Syncytial Virus NOT DETECTED NOT DETECTED Final   Bordetella pertussis NOT DETECTED NOT DETECTED Final   Chlamydophila pneumoniae NOT DETECTED NOT DETECTED Final   Mycoplasma pneumoniae NOT DETECTED NOT DETECTED Final    Comment: Performed at [facility], [place] [address] [place], [place] [address]         Radiology Studies: Dg Chest 2 View  Result Date: 11/09/2017 CLINICAL DATA:  Brain cancer.  Fever. EXAM: CHEST - 2 VIEW COMPARISON:  October 25, 2017 FINDINGS: Increased interstitial markings in the lungs. The heart size borderline. The hila and mediastinum are normal. No pulmonary nodules or masses. No focal infiltrates. IMPRESSION: Increasing interstitial opacities in the lungs could represent atypical infection or edema. Electronically Signed   By: Dorise Bullion III M.D   On: 11/09/2017 13:19        Scheduled  Meds: . dexamethasone  2 mg Oral Daily  . feeding supplement (ENSURE ENLIVE)  237 mL Oral BID BM  . levETIRAcetam  500 mg Oral BID   Continuous Infusions: . sodium chloride    . ceFEPime (MAXIPIME) IV Stopped (11/10/17 1219)  . vancomycin Stopped (11/10/17 0354)     LOS: 1 day    Time spent: 35 minutes.     Hosie Poisson, MD [facility] Pager [phone]  If 7PM-7AM, please contact night-coverage [email or web address] Password [facility] 11/10/2017, 6:49 PM

## 2017-11-10 NOTE — Progress Notes (Signed)
CRITICAL VALUE ALERT  Critical Value:  Lactic acid 3.1  Date & Time Notied:  11/10/17 @ 1113  Provider Notified: Karleen Hampshire, MD  Orders Received/Actions taken:

## 2017-11-10 NOTE — Progress Notes (Signed)
  Radiation Oncology         [phone] ________________________________  Name: Christopher Morris MRN: [id number]  Date: 10/17/2017  DOB: 06-Dec-1940  SIMULATION AND TREATMENT PLANNING NOTE  DIAGNOSIS:     ICD-10-CM   1. Frontal glioblastoma multiforme (HCC) C71.1      Site:  brain  NARRATIVE:  The patient was brought to the [facility].  Identity was confirmed.  All relevant records and images related to the planned course of therapy were reviewed.   Written consent to proceed with treatment was confirmed which was freely given after reviewing the details related to the planned course of therapy had been reviewed with the patient.  Then, the patient was set-up in a stable reproducible  supine position for radiation therapy.  CT images were obtained.  Surface markings were placed.    Medically necessary complex treatment device(s) for immobilization:  customized thermoplastic mask/ headcast.   The CT images were loaded into the planning software.  Then the target and avoidance structures were contoured.  Treatment planning then occurred.  The radiation prescription was entered and confirmed.  A total of 5 complex treatment devices were fabricated which relate to the designed radiation treatment fields.  This is based on the patient's tomotherapy sinogram.  Each of these customized fields/ complex treatment devices will be used on a daily basis during the radiation course. I have requested : 3D Simulation  I have requested a DVH of the following structures: PTV, brain, brainstem, optic chiasm.  The patient will undergo daily image guidance to ensure accurate localization of the target, and adequate minimize dose to the normal surrounding structures in close proximity to the target.   PLAN:  The patient will receive 46 Gy in 23 fractions initially. The patient will then receive a 14 Gy boost for a total dose of 60 Gy.   Special treatment procedure The patient will also receive  concurrent chemotherapy during the treatment. The patient may therefore experience increased toxicity or side effects and the patient will be monitored for such problems. This may require extra lab work as necessary. This therefore constitutes a special treatment procedure.   ________________________________   Jodelle Gross, MD, PhD

## 2017-11-11 ENCOUNTER — Telehealth: Payer: Self-pay | Admitting: Pharmacist

## 2017-11-11 ENCOUNTER — Ambulatory Visit
Admission: RE | Admit: 2017-11-11 | Discharge: 2017-11-11 | Disposition: A | Discharge status: Home / Self Care | Payer: BLUE CROSS/BLUE SHIELD | Source: Ambulatory Visit | Attending: Radiation Oncology | Admitting: Radiation Oncology

## 2017-11-11 ENCOUNTER — Inpatient Hospital Stay (HOSPITAL_COMMUNITY): Payer: Medicare Other

## 2017-11-11 DIAGNOSIS — J849 Interstitial pulmonary disease, unspecified: Secondary | ICD-10-CM | POA: Diagnosis present

## 2017-11-11 LAB — LEGIONELLA PNEUMOPHILA SEROGP 1 UR AG: L. PNEUMOPHILA SEROGP 1 UR AG: NEGATIVE

## 2017-11-11 LAB — LACTIC ACID, PLASMA: LACTIC ACID, VENOUS: 2.1 mmol/L — AB (ref 0.5–1.9)

## 2017-11-11 MED ORDER — LIP MEDEX EX OINT
1.0000 "application " | TOPICAL_OINTMENT | CUTANEOUS | Status: DC | PRN
  Filled 2017-11-11: qty 7

## 2017-11-11 MED ORDER — AMOXICILLIN-POT CLAVULANATE 875-125 MG PO TABS
1.0000 | ORAL_TABLET | Freq: Two times a day (BID) | ORAL | Status: DC
  Administered 2017-11-11 – 2017-11-12 (×3): 1 via ORAL
  Filled 2017-11-11 (×3): qty 1

## 2017-11-11 NOTE — Telephone Encounter (Signed)
Oral Oncology Pharmacist Encounter  Received call from patient's daughter, [name], with questions about Temodar start date due to new infection. Patient currently admitted at [facility] for suspected pneumonia. Pt noted with high lactic acid on admission on 11/09/17  Patient with glioblastoma multiforme for concurrent Temodar and radiation. Temodar start date had previously been held until patient off antibiotics and symptoms of infection completely resolved. Radiation start date: 10/28/17  [name] instructed to wait until patient off antibiotics and recovered fully from infection before starting Temodar.  [name] instructed to contact Dr. Mickeal Skinner for further clarification of start date once patient discharged from current admission.  Oral Oncology Clinic will continue to follow.  [name], PharmD, BCPS, BCOP 11/11/2017 8:51 AM Oral Oncology Clinic [phone]

## 2017-11-11 NOTE — Progress Notes (Signed)
Nutrition Brief Note  Patient identified on the Malnutrition Screening Tool (MST) Report  Wt Readings from Last 15 Encounters:  11/09/17 186 lb 15.2 oz (84.8 kg)  11/07/17 187 lb 1.6 oz (84.9 kg)  10/25/17 192 lb 14.4 oz (87.5 kg)  10/21/17 190 lb 14.7 oz (86.6 kg)  10/17/17 188 lb 3.2 oz (85.4 kg)  10/17/17 188 lb 9.6 oz (85.5 kg)  10/07/17 190 lb 14.7 oz (86.6 kg)  09/30/17 190 lb 14.7 oz (86.6 kg)  05/03/16 210 lb (95.3 kg)   Patient with PMH significant for glioblastoma on Decadron and history of seizures. Presents this admission with PNA. Pt denies any recent loss of appetite or unintentional wt loss. No skin breakdown recorded.   Body mass index is 22.76 kg/m. Patient meets criteria for normal based on current BMI.   Current diet order is heart healthy, patient is consuming approximately 75-100% of meals at this time. Labs and medications reviewed.   No nutrition interventions warranted at this time. If nutrition issues arise, please consult RD.   Mariana Single RD, LDN Clinical Nutrition Pager # [phone]

## 2017-11-11 NOTE — Progress Notes (Signed)
CRITICAL VALUE ALERT  Critical Value:  Lactic Acid 2.1  Date & Time Notied:  11/11/17 1205p  Provider Notified: Karleen Hampshire  Orders Received/Actions taken:  Await response

## 2017-11-11 NOTE — Progress Notes (Signed)
PROGRESS NOTE    Christopher Morris  STM:[id number] DOB: 01-15-1941 DOA: 11/09/2017 PCP: Patient, No Pcp Per   Brief Narrative:  Christopher Morris is a 77 y.o. male with medical history significant of glioblastoma on Decadron and history of seizures.  Patient presented to the hospital complaining 1 day complaint of chills.  Patient was found to have lactic acidosis and sepsis secondary to pneumonia chest x-ray findings.       Assessment & Plan:   Active Problems:   Seizure ([place])   Frontal glioblastoma multiforme (HCC)   Sepsis ([place])   Sepsis from health care associated pneumonia from immunosuppresion:  - admitted for IV antibiotics, trend lactic acid . D/c IV fluids.  - resp panel neg.  Influenza neg.  Urine strep antigen negative.  Pt had low grade fever of 100.3, blood cultures negative so far. He reports some chills.  Sputum unacceptable.  Repeat CXR to see if pneumonia is worsening. It appears to have changes of interstitial pneumonia.  Plan for d/c in am with oral antibiotics if no fever.    Lactic acidosis: Improving.    Glioblastoma:  Continue with decadron.  Outpatient follow up with oncology. Radiation treatment in progress.    Seizures:  Resume keppra.      DVT prophylaxis: scd's Code Status: full code.  Family Communication: daughter at bedside.  Disposition Plan: home tomorrow.   Consultants:   None.    Procedures: none.    Antimicrobials: vancomycin and cefepime since admission. Transitioned to oral augmentin for discharge in am.     Subjective: Reports some chills and wants to go home in am.   Objective: Vitals:   11/10/17 1259 11/10/17 2041 11/11/17 0501 11/11/17 1257  BP: (!) 143/93 122/75 (!) 150/78 134/76  Pulse: 92 75 80 88  Resp: 16 (!) 22 17 16   Temp: 98.6 F (37 C) 98.9 F (37.2 C) 100.3 F (37.9 C) 100.2 F (37.9 C)  TempSrc: Oral Oral Oral Oral  SpO2: 93% 94% 96% 96%  Weight:      Height:        Intake/Output Summary  (Last 24 hours) at 11/11/2017 1835 Last data filed at 11/11/2017 0930 Gross per 24 hour  Intake 2267.5 ml  Output 1500 ml  Net 767.5 ml   Filed Weights   11/09/17 1155 11/09/17 1750  Weight: 83.9 kg (185 lb) 84.8 kg (186 lb 15.2 oz)    Examination:  General exam: Appears calm and comfortable on RA.  Respiratory system: Clear to auscultation. Respiratory effort normal. No wheezing or rhonchi.  Cardiovascular system: S1 & S2 heard, RRR. No JVD. No pedal edema. Gastrointestinal system: Abdomen is soft non tender non distended bowel sounds heard.  Central nervous system: Alert and oriented. Non focal.  Extremities: Symmetric 5 x 5 power. No cyanosis or clubbing.  Skin: No rashes, lesions or ulcers Psychiatry:Mood & affect appropriate.     Data Reviewed: I have personally reviewed following labs and imaging studies  CBC: Recent Labs  Lab 11/07/17 0828 11/09/17 1207 11/10/17 1031  WBC 8.8 6.6 6.5  NEUTROABS 6.7* 5.6 5.2  HGB 12.0* 10.7* 9.9*  HCT 36.1* 32.4* 30.3*  MCV 97.8 96.1 97.1  PLT 127* 137* 892*   Basic Metabolic Panel: Recent Labs  Lab 11/07/17 0828 11/09/17 1207 11/10/17 1031  NA 140 137 141  K 4.1 3.9 4.5  CL 102 101 105  CO2 25 22 26   GLUCOSE 183* 178* 138*  BUN 12 17 14   CREATININE 0.92 1.03  0.77  CALCIUM 9.1 8.6* 8.6*   GFR: Estimated Creatinine Clearance: 89.8 mL/min (by C-G formula based on SCr of 0.77 mg/dL). Liver Function Tests: Recent Labs  Lab 11/07/17 0828 11/09/17 1207  AST 19 28  ALT 36 43  ALKPHOS 76 77  BILITOT 0.9 0.8  PROT 6.5 5.9*  ALBUMIN 2.9* 2.7*   No results for input(s): LIPASE, AMYLASE in the last 168 hours. No results for input(s): AMMONIA in the last 168 hours. Coagulation Profile: No results for input(s): INR, PROTIME in the last 168 hours. Cardiac Enzymes: No results for input(s): CKTOTAL, CKMB, CKMBINDEX, TROPONINI in the last 168 hours. BNP (last 3 results) No results for input(s): PROBNP in the last 8760  hours. HbA1C: No results for input(s): HGBA1C in the last 72 hours. CBG: No results for input(s): GLUCAP in the last 168 hours. Lipid Profile: No results for input(s): CHOL, HDL, LDLCALC, TRIG, CHOLHDL, LDLDIRECT in the last 72 hours. Thyroid Function Tests: No results for input(s): TSH, T4TOTAL, FREET4, T3FREE, THYROIDAB in the last 72 hours. Anemia Panel: No results for input(s): VITAMINB12, FOLATE, FERRITIN, TIBC, IRON, RETICCTPCT in the last 72 hours. Sepsis Labs: Recent Labs  Lab 11/09/17 1242 11/09/17 1448 11/10/17 1031 11/11/17 1108  LATICACIDVEN 6.14* 2.45* 3.1* 2.1*    Recent Results (from the past 240 hour(s))  Blood Culture (routine x 2)     Status: None (Preliminary result)   Collection Time: 11/09/17 12:10 PM  Result Value Ref Range Status   Specimen Description   Final    BLOOD BLOOD RIGHT FOREARM Performed at [facility] [address], [place], [place] [address]    Special Requests   Final    BOTTLES DRAWN AEROBIC AND ANAEROBIC Blood Culture results may not be optimal due to an excessive volume of blood received in culture bottles Performed at [facility] [address] [place], [place] [address]    Culture   Final    NO GROWTH 2 DAYS Performed at [facility] [address], [place], [place] [address]    Report Status PENDING  Incomplete  Blood Culture (routine x 2)     Status: None (Preliminary result)   Collection Time: 11/09/17 12:12 PM  Result Value Ref Range Status   Specimen Description   Final    BLOOD RIGHT ANTECUBITAL Performed at [facility] [address] [place], [place] [address]    Special Requests   Final    BOTTLES DRAWN AEROBIC AND ANAEROBIC Blood Culture results may not be optimal due to an excessive volume of blood received in culture bottles Performed at [facility] [address] [place], [place] [address]    Culture   Final    NO GROWTH  2 DAYS Performed at [facility] [address] [place], [place] [address]    Report Status PENDING  Incomplete  Urine culture     Status: None   Collection Time: 11/09/17  3:21 PM  Result Value Ref Range Status   Specimen Description   Final    URINE, RANDOM Performed at [facility] [address], [place], [place] [address]    Special Requests   Final    NONE Performed at [facility], [place] [address], [place], [place] [address]    Culture   Final    NO GROWTH Performed at [facility], [place] [address] [place],  [address]  Report Status 11/10/2017 FINAL  Final  Respiratory Panel by PCR     Status: None   Collection Time: 11/09/17  5:45 PM  Result Value Ref Range Status   Adenovirus NOT DETECTED NOT DETECTED Final   Coronavirus 229E NOT DETECTED NOT DETECTED Final   Coronavirus HKU1 NOT DETECTED NOT DETECTED Final   Coronavirus NL63 NOT DETECTED NOT DETECTED Final   Coronavirus OC43 NOT DETECTED NOT DETECTED Final   Metapneumovirus NOT DETECTED NOT DETECTED Final   Rhinovirus / Enterovirus NOT DETECTED NOT DETECTED Final   Influenza A NOT DETECTED NOT DETECTED Final   Influenza B NOT DETECTED NOT DETECTED Final   Parainfluenza Virus 1 NOT DETECTED NOT DETECTED Final   Parainfluenza Virus 2 NOT DETECTED NOT DETECTED Final   Parainfluenza Virus 3 NOT DETECTED NOT DETECTED Final   Parainfluenza Virus 4 NOT DETECTED NOT DETECTED Final   Respiratory Syncytial Virus NOT DETECTED NOT DETECTED Final   Bordetella pertussis NOT DETECTED NOT DETECTED Final   Chlamydophila pneumoniae NOT DETECTED NOT DETECTED Final   Mycoplasma pneumoniae NOT DETECTED NOT DETECTED Final    Comment: Performed at [facility], [place] [address] [place], [place] [address]  Culture, sputum-assessment     Status: None   Collection Time: 11/10/17  9:51 PM  Result Value Ref Range Status   Specimen Description SPUTUM  Final   Special Requests  NONE  Final   Sputum evaluation   Final    Sputum specimen not acceptable for testing.  Please recollect.   RESULT CALLED TO, READ BACK BY AND VERIFIED WITH: JOHN HOLDER AT 2340 ON 11/10/17 BY MOHAMED,A Performed at [facility], [place] [address], [place], [place] [address]    Report Status 11/10/2017 FINAL  Final         Radiology Studies: Dg Chest 2 View  Result Date: 11/11/2017 CLINICAL DATA:  Followup interstitial opacities.  Persistent fever. EXAM: CHEST - 2 VIEW COMPARISON:  PA and lateral chest x-ray of November 09, 2017 FINDINGS: The lungs are adequately inflated. The interstitial markings are coarse but stable. There is no pleural effusion. The heart and pulmonary vascularity are normal. The mediastinum is normal in width. There is calcification in the wall of the aortic arch. The trachea is midline. The bony thorax exhibits no acute abnormality. IMPRESSION: Persistent interstitial changes bilaterally little change since the study of April 27th but much more conspicuous than on the study of October 25, 2017. This could reflect interstitial pneumonia or atypical pulmonary edema though I favor the former. There is no pleural effusion. Thoracic aortic atherosclerosis. Electronically Signed   By: David  [place]ique M.D.   On: 11/11/2017 16:50        Scheduled Meds: . amoxicillin-clavulanate  1 tablet Oral Q12H  . dexamethasone  2 mg Oral Daily  . feeding supplement (ENSURE ENLIVE)  237 mL Oral BID BM  . levETIRAcetam  500 mg Oral BID   Continuous Infusions:    LOS: 2 days    Time spent: 25 minutes.     Hosie Poisson, MD [facility] Pager [phone]  If 7PM-7AM, please contact night-coverage [email or web address] Password [facility] 11/11/2017, 6:35 PM

## 2017-11-12 ENCOUNTER — Ambulatory Visit
Admission: RE | Admit: 2017-11-12 | Discharge: 2017-11-12 | Disposition: A | Discharge status: Home / Self Care | Payer: BLUE CROSS/BLUE SHIELD | Source: Ambulatory Visit | Attending: Radiation Oncology | Admitting: Radiation Oncology

## 2017-11-12 DIAGNOSIS — J849 Interstitial pulmonary disease, unspecified: Secondary | ICD-10-CM

## 2017-11-12 LAB — LACTIC ACID, PLASMA
Lactic Acid, Venous: 3.8 mmol/L (ref 0.5–1.9)
Lactic Acid, Venous: 1.9 mmol/L (ref 0.5–1.9)

## 2017-11-12 MED ORDER — ENSURE ENLIVE PO LIQD: 237.0000 mL | Freq: Two times a day (BID) | ORAL | 12 refills | Status: DC

## 2017-11-12 MED ORDER — AMOXICILLIN-POT CLAVULANATE 875-125 MG PO TABS: 1.0000 | ORAL_TABLET | Freq: Two times a day (BID) | ORAL | 0 refills | Status: DC

## 2017-11-12 NOTE — Discharge Planning (Signed)
Dr. Text to inform of critical lactic acid 3.8.  Also confirming if still ok for discharge per family.

## 2017-11-12 NOTE — Evaluation (Signed)
Physical Therapy One Time Evaluation Patient Details Name: Christopher Morris MRN: [id number] DOB: [birth year] Today's Date: 11/12/2017   History of Present Illness  77 y.o. male with medical history significant of glioblastoma-s/p L craniotomy 09/2017, seizures and admitted for sepsis due to HCAP.  Pt with recent admission for UTI  Clinical Impression  Patient evaluated by Physical Therapy with no further acute PT needs identified. All education has been completed and the patient has no further questions. RN and pt reports d/c home today hopefully.  Pt with recent admission and 24/7 supervision and HHPT recommended at that time as well.  Pt mobilizing with wide BOS and fast pace however no overt LOB observed.  Pt very hopeful for d/c home today.  See below for any follow-up Physical Therapy or equipment needs. PT is signing off. Thank you for this referral.     Follow Up Recommendations Home health PT;Supervision/Assistance - 24 hour    Equipment Recommendations  None recommended by PT    Recommendations for Other Services       Precautions / Restrictions Precautions Precautions: Fall      Mobility  Bed Mobility Overal bed mobility: Needs Assistance Bed Mobility: Supine to Sit     Supine to sit: Supervision        Transfers Overall transfer level: Needs assistance Equipment used: None Transfers: Sit to/from Stand Sit to Stand: Supervision         General transfer comment: wide BOS observed  Ambulation/Gait Ambulation/Gait assistance: Supervision;Min guard Ambulation Distance (Feet): 400 Feet Assistive device: None Gait Pattern/deviations: Step-through pattern     General Gait Details: pt with fast but steady pace, no LOB observed, pt denies any symptoms however HR 133 bpm and cardiac monitoring called RN to notify of HR in 140s end of ambulation  Stairs            Wheelchair Mobility    Modified Rankin (Stroke Patients Only)       Balance Overall  balance assessment: Mild deficits observed, not formally tested(wide BOS observed, slow but steady walking backwards)                                           Pertinent Vitals/Pain Pain Assessment: No/denies pain    Home Living Family/patient expects to be discharged to:: Private residence Living Arrangements: Children;Other relatives Available Help at Discharge: Family;Available PRN/intermittently Type of Home: Apartment Home Access: Level entry     Home Layout: One level Home Equipment: None Additional Comments: no family present during eval, pt with recent admission as above, pt reports today that daughter does not work and can be home 24/7    Prior Function Level of Independence: Independent   Gait / Transfers Assistance Needed: ambulatory without a device     Comments: recent short stay at SNF s/p craniotomy 09/2017     Hand Dominance        Extremity/Trunk Assessment        Lower Extremity Assessment Lower Extremity Assessment: Generalized weakness    Cervical / Trunk Assessment Cervical / Trunk Assessment: Normal  Communication   Communication: No difficulties  Cognition Arousal/Alertness: Awake/alert Behavior During Therapy: WFL for tasks assessed/performed Overall Cognitive Status: Within Functional Limits for tasks assessed  General Comments      Exercises     Assessment/Plan    PT Assessment All further PT needs can be met in the next venue of care  PT Problem List Decreased mobility;Decreased balance;Decreased knowledge of use of DME       PT Treatment Interventions      PT Goals (Current goals can be found in the Care Plan section)  Acute Rehab PT Goals Patient Stated Goal: home today PT Goal Formulation: All assessment and education complete, DC therapy    Frequency     Barriers to discharge        Co-evaluation               AM-PAC PT "6 Clicks"  Daily Activity  Outcome Measure Difficulty turning over in bed (including adjusting bedclothes, sheets and blankets)?: None Difficulty moving from lying on back to sitting on the side of the bed? : None Difficulty sitting down on and standing up from a chair with arms (e.g., wheelchair, bedside commode, etc,.)?: A Little Help needed moving to and from a bed to chair (including a wheelchair)?: A Little Help needed walking in hospital room?: A Little Help needed climbing 3-5 steps with a railing? : A Little 6 Click Score: 20    End of Session Equipment Utilized During Treatment: Gait belt Activity Tolerance: Patient tolerated treatment well Patient left: in chair;with call bell/phone within reach;with nursing/sitter in room(NT changing pt's bed)   PT Visit Diagnosis: Unsteadiness on feet (R26.81);Difficulty in walking, not elsewhere classified (R26.2)    Time: 7681-1572 PT Time Calculation (min) (ACUTE ONLY): 12 min   Charges:   PT Evaluation $PT Eval Low Complexity: 1 Low     PT G Codes:        Carmelia Bake, PT, DPT 11/12/2017 Pager: [phone]  York Ram E 11/12/2017, 11:39 AM

## 2017-11-12 NOTE — Discharge Planning (Signed)
Patient IV and tele removed.  RN assessment and VS revealed stability for DC to home with daughter.  Discharge papers given, explained and educated. Informed of suggested FU appts and appts made. Scripts given. Lactic Acid returned to normal levels prior to DC. Was wheeled to front and family transported home via car.

## 2017-11-12 NOTE — Care Management Important Message (Signed)
Important Message  Patient Details  Name: Christopher Morris MRN: [id number] Date of Birth: 20-Feb-1941   Medicare Important Message Given:  Yes    Kerin Salen 11/12/2017, 10:31 AMImportant Message  Patient Details  Name: Christopher Morris MRN: [id number] Date of Birth: [birth year]   Medicare Important Message Given:  Yes    Kerin Salen 11/12/2017, 10:30 AM

## 2017-11-13 ENCOUNTER — Emergency Department (HOSPITAL_COMMUNITY): Payer: Medicare Other

## 2017-11-13 ENCOUNTER — Encounter (HOSPITAL_COMMUNITY): Payer: Self-pay

## 2017-11-13 ENCOUNTER — Ambulatory Visit: Payer: BLUE CROSS/BLUE SHIELD

## 2017-11-13 ENCOUNTER — Inpatient Hospital Stay (HOSPITAL_COMMUNITY)
Admission: EM | Admit: 2017-11-13 | Discharge: 2017-11-16 | DRG: 864 | Disposition: A | Discharge status: Home Health | Payer: Medicare Other | Attending: Internal Medicine | Admitting: Internal Medicine

## 2017-11-13 ENCOUNTER — Other Ambulatory Visit: Payer: Self-pay

## 2017-11-13 ENCOUNTER — Inpatient Hospital Stay (HOSPITAL_COMMUNITY): Payer: Medicare Other

## 2017-11-13 DIAGNOSIS — Z91018 Allergy to other foods: Secondary | ICD-10-CM

## 2017-11-13 DIAGNOSIS — E872 Acidosis: Secondary | ICD-10-CM | POA: Diagnosis present

## 2017-11-13 DIAGNOSIS — D649 Anemia, unspecified: Secondary | ICD-10-CM | POA: Diagnosis not present

## 2017-11-13 DIAGNOSIS — R509 Fever, unspecified: Principal | ICD-10-CM

## 2017-11-13 DIAGNOSIS — A419 Sepsis, unspecified organism: Secondary | ICD-10-CM

## 2017-11-13 DIAGNOSIS — E538 Deficiency of other specified B group vitamins: Secondary | ICD-10-CM | POA: Diagnosis not present

## 2017-11-13 DIAGNOSIS — Z7952 Long term (current) use of systemic steroids: Secondary | ICD-10-CM

## 2017-11-13 DIAGNOSIS — R569 Unspecified convulsions: Secondary | ICD-10-CM | POA: Diagnosis not present

## 2017-11-13 DIAGNOSIS — E860 Dehydration: Secondary | ICD-10-CM

## 2017-11-13 DIAGNOSIS — W19XXXA Unspecified fall, initial encounter: Secondary | ICD-10-CM | POA: Diagnosis present

## 2017-11-13 DIAGNOSIS — R531 Weakness: Secondary | ICD-10-CM | POA: Diagnosis not present

## 2017-11-13 DIAGNOSIS — Z8249 Family history of ischemic heart disease and other diseases of the circulatory system: Secondary | ICD-10-CM | POA: Diagnosis not present

## 2017-11-13 DIAGNOSIS — Z9889 Other specified postprocedural states: Secondary | ICD-10-CM

## 2017-11-13 DIAGNOSIS — D638 Anemia in other chronic diseases classified elsewhere: Secondary | ICD-10-CM | POA: Diagnosis present

## 2017-11-13 DIAGNOSIS — W19XXXD Unspecified fall, subsequent encounter: Secondary | ICD-10-CM | POA: Diagnosis not present

## 2017-11-13 DIAGNOSIS — Z79899 Other long term (current) drug therapy: Secondary | ICD-10-CM | POA: Diagnosis not present

## 2017-11-13 DIAGNOSIS — Z823 Family history of stroke: Secondary | ICD-10-CM | POA: Diagnosis not present

## 2017-11-13 DIAGNOSIS — C711 Malignant neoplasm of frontal lobe: Secondary | ICD-10-CM

## 2017-11-13 DIAGNOSIS — Z91013 Allergy to seafood: Secondary | ICD-10-CM

## 2017-11-13 LAB — I-STAT CG4 LACTIC ACID, ED: LACTIC ACID, VENOUS: 3.02 mmol/L — AB (ref 0.5–1.9)

## 2017-11-13 LAB — URINALYSIS, ROUTINE W REFLEX MICROSCOPIC
BILIRUBIN URINE: NEGATIVE
Glucose, UA: NEGATIVE mg/dL
Hgb urine dipstick: NEGATIVE
KETONES UR: NEGATIVE mg/dL
Leukocytes, UA: NEGATIVE
NITRITE: NEGATIVE
Protein, ur: NEGATIVE mg/dL
SPECIFIC GRAVITY, URINE: 1.021 (ref 1.005–1.030)
pH: 6 (ref 5.0–8.0)

## 2017-11-13 LAB — COMPREHENSIVE METABOLIC PANEL
ALBUMIN: 2.5 g/dL — AB (ref 3.5–5.0)
ALT: 65 U/L — ABNORMAL HIGH (ref 17–63)
AST: 32 U/L (ref 15–41)
Alkaline Phosphatase: 58 U/L (ref 38–126)
Anion gap: 11 (ref 5–15)
BILIRUBIN TOTAL: 0.9 mg/dL (ref 0.3–1.2)
BUN: 16 mg/dL (ref 6–20)
CHLORIDE: 103 mmol/L (ref 101–111)
CO2: 26 mmol/L (ref 22–32)
Calcium: 8.8 mg/dL — ABNORMAL LOW (ref 8.9–10.3)
Creatinine, Ser: 0.85 mg/dL (ref 0.61–1.24)
GFR calc Af Amer: >60 mL/min (ref >60)
GFR calc non Af Amer: >60 mL/min (ref >60)
GLUCOSE: 128 mg/dL — AB (ref 65–99)
POTASSIUM: 4.1 mmol/L (ref 3.5–5.1)
Sodium: 140 mmol/L (ref 135–145)
TOTAL PROTEIN: 6.4 g/dL — AB (ref 6.5–8.1)

## 2017-11-13 LAB — MAGNESIUM: Magnesium: 1.9 mg/dL (ref 1.7–2.4)

## 2017-11-13 LAB — CBC
HEMATOCRIT: 31.4 % — AB (ref 39.0–52.0)
Hemoglobin: 10.6 g/dL — ABNORMAL LOW (ref 13.0–17.0)
MCH: 31.8 pg (ref 26.0–34.0)
MCHC: 33.8 g/dL (ref 30.0–36.0)
MCV: 94.3 fL (ref 78.0–100.0)
PLATELETS: 168 10*3/uL (ref 150–400)
RBC: 3.33 MIL/uL — ABNORMAL LOW (ref 4.22–5.81)
RDW: 13.1 % (ref 11.5–15.5)
WBC: 7.2 10*3/uL (ref 4.0–10.5)

## 2017-11-13 MED ORDER — ONDANSETRON HCL 4 MG PO TABS: 4.0000 mg | ORAL_TABLET | Freq: Four times a day (QID) | ORAL | Status: DC | PRN

## 2017-11-13 MED ORDER — VANCOMYCIN HCL IN DEXTROSE 1-5 GM/200ML-% IV SOLN
1000.0000 mg | Freq: Once | INTRAVENOUS | Status: AC
  Administered 2017-11-13: 1000 mg via INTRAVENOUS
  Filled 2017-11-13: qty 200

## 2017-11-13 MED ORDER — ACETAMINOPHEN 325 MG PO TABS: 650.0000 mg | ORAL_TABLET | Freq: Four times a day (QID) | ORAL | Status: DC | PRN

## 2017-11-13 MED ORDER — SENNOSIDES-DOCUSATE SODIUM 8.6-50 MG PO TABS: 1.0000 | ORAL_TABLET | Freq: Every evening | ORAL | Status: DC | PRN

## 2017-11-13 MED ORDER — IOPAMIDOL (ISOVUE-300) INJECTION 61%
100.0000 mL | Freq: Once | INTRAVENOUS | Status: AC | PRN
  Administered 2017-11-13: 100 mL via INTRAVENOUS

## 2017-11-13 MED ORDER — IOPAMIDOL (ISOVUE-300) INJECTION 61%
INTRAVENOUS | Status: AC
  Filled 2017-11-13: qty 100

## 2017-11-13 MED ORDER — TRAMADOL HCL 50 MG PO TABS: 50.0000 mg | ORAL_TABLET | Freq: Four times a day (QID) | ORAL | Status: DC | PRN

## 2017-11-13 MED ORDER — SENNOSIDES-DOCUSATE SODIUM 8.6-50 MG PO TABS
1.0000 | ORAL_TABLET | Freq: Two times a day (BID) | ORAL | Status: DC
  Administered 2017-11-13 – 2017-11-16 (×6): 1 via ORAL
  Filled 2017-11-13 (×6): qty 1

## 2017-11-13 MED ORDER — SORBITOL 70 % SOLN
30.0000 mL | Freq: Every day | Status: DC | PRN
  Filled 2017-11-13: qty 30

## 2017-11-13 MED ORDER — PIPERACILLIN-TAZOBACTAM 3.375 G IVPB
3.3750 g | Freq: Three times a day (TID) | INTRAVENOUS | Status: AC
  Administered 2017-11-13 – 2017-11-15 (×7): 3.375 g via INTRAVENOUS
  Filled 2017-11-13 (×8): qty 50

## 2017-11-13 MED ORDER — SODIUM CHLORIDE 0.9 % IV BOLUS
2000.0000 mL | Freq: Once | INTRAVENOUS | Status: AC
  Administered 2017-11-13: 2000 mL via INTRAVENOUS

## 2017-11-13 MED ORDER — ENOXAPARIN SODIUM 40 MG/0.4ML ~~LOC~~ SOLN
40.0000 mg | SUBCUTANEOUS | Status: DC
  Administered 2017-11-13 – 2017-11-15 (×3): 40 mg via SUBCUTANEOUS
  Filled 2017-11-13 (×3): qty 0.4

## 2017-11-13 MED ORDER — ONDANSETRON HCL 4 MG/2ML IJ SOLN: 4.0000 mg | Freq: Four times a day (QID) | INTRAMUSCULAR | Status: DC | PRN

## 2017-11-13 MED ORDER — LEVETIRACETAM 500 MG PO TABS
500.0000 mg | ORAL_TABLET | Freq: Two times a day (BID) | ORAL | Status: DC
  Administered 2017-11-13 – 2017-11-16 (×6): 500 mg via ORAL
  Filled 2017-11-13 (×6): qty 1

## 2017-11-13 MED ORDER — ENSURE ENLIVE PO LIQD
237.0000 mL | Freq: Two times a day (BID) | ORAL | Status: DC
  Administered 2017-11-14 – 2017-11-16 (×5): 237 mL via ORAL

## 2017-11-13 MED ORDER — ACETAMINOPHEN 650 MG RE SUPP: 650.0000 mg | Freq: Four times a day (QID) | RECTAL | Status: DC | PRN

## 2017-11-13 MED ORDER — SODIUM CHLORIDE 0.9 % IV SOLN
INTRAVENOUS | Status: DC
  Administered 2017-11-13 – 2017-11-16 (×4): via INTRAVENOUS

## 2017-11-13 MED ORDER — VANCOMYCIN HCL IN DEXTROSE 750-5 MG/150ML-% IV SOLN
750.0000 mg | Freq: Two times a day (BID) | INTRAVENOUS | Status: DC
  Administered 2017-11-13 – 2017-11-15 (×4): 750 mg via INTRAVENOUS
  Filled 2017-11-13 (×5): qty 150

## 2017-11-13 MED ORDER — DEXAMETHASONE 4 MG PO TABS
4.0000 mg | ORAL_TABLET | Freq: Two times a day (BID) | ORAL | Status: DC
  Administered 2017-11-13 – 2017-11-16 (×6): 4 mg via ORAL
  Filled 2017-11-13 (×6): qty 1

## 2017-11-13 MED ORDER — POLYETHYLENE GLYCOL 3350 17 G PO PACK
17.0000 g | PACK | Freq: Every day | ORAL | Status: DC
  Administered 2017-11-14 – 2017-11-16 (×3): 17 g via ORAL
  Filled 2017-11-13 (×3): qty 1

## 2017-11-13 MED ORDER — MAGNESIUM CITRATE PO SOLN: 1.0000 | Freq: Once | ORAL | Status: DC | PRN

## 2017-11-13 MED ORDER — PIPERACILLIN-TAZOBACTAM 3.375 G IVPB 30 MIN
3.3750 g | Freq: Once | INTRAVENOUS | Status: AC
Start: 2017-11-13 — End: 2017-11-13
  Administered 2017-11-13: 3.375 g via INTRAVENOUS
  Filled 2017-11-13: qty 50

## 2017-11-13 MED ORDER — KETOROLAC TROMETHAMINE 30 MG/ML IJ SOLN: 30.0000 mg | Freq: Four times a day (QID) | INTRAMUSCULAR | Status: DC | PRN

## 2017-11-13 NOTE — Progress Notes (Signed)
Pharmacy Antibiotic Note  Christopher Morris is a 77 y.o. male with recently diagnosed glioblastoma s/p craniotomy and radiation admitted on 11/13/2017 with fever, weakness, and falls. Recently admitted 4/12-4/15 for seizures and noted to have E coli bacteremia; treated with Ceftriaxone >> Cipro. Admitted again from 4/27-4/30 with sepsis d/t HCAP and treated with Vanc/Cefepime x 2d >> Augmentin but returned on D5 of abx. Pharmacy has been consulted for vancomycin and Zosyn dosing. Blood and urine cultures pending.  Plan:  Vancomycin 1000 mg IV now, then 750 mg IV q12 hr (est AUC 432 based on SCr rounded to 1 from 0.85)  Measure vancomycin AUC at steady state as indicated  Zosyn 3.375 g IV given once over 30 minutes, then every 8 hrs by 4-hr infusion   Height: 6\' 4"  (193 cm) Weight: 186 lb (84.4 kg) IBW/kg (Calculated) : 86.8  Temp (24hrs), Avg:99.3 F (37.4 C), Min:98.6 F (37 C), Max:100 F (37.8 C)  Recent Labs  Lab 11/07/17 0828 11/09/17 1207  11/10/17 1031 11/11/17 1108 11/12/17 1026 11/12/17 1302 11/13/17 1205 11/13/17 1213  WBC 8.8 6.6  --  6.5  --   --   --  7.2  --   CREATININE 0.92 1.03  --  0.77  --   --   --  0.85  --   LATICACIDVEN  --   --    < > 3.1* 2.1* 3.8* 1.9  --  3.02*   < > = values in this interval not displayed.    Estimated Creatinine Clearance: 84.1 mL/min (by C-G formula based on SCr of 0.85 mg/dL).    Allergies  Allergen Reactions  . Pork-Derived Products     Patient does not eat pork  . Shellfish Allergy     Patient does not eat shellfish    Thank you for allowing pharmacy to be a part of this patient's care.  Viren Lebeau A 11/13/2017 6:16 PM

## 2017-11-13 NOTE — ED Notes (Signed)
Pt reminded of need for UA 

## 2017-11-13 NOTE — ED Provider Notes (Signed)
[facility] DEPT Provider Note   CSN: [id number] Arrival date & time: 11/13/17  1103     History   Chief Complaint Chief Complaint  Patient presents with  . Weakness  . Fever    HPI Christopher Morris is a 77 y.o. male.  HPI Patient is a 77 year old male currently being treated for glioblastoma multi-forming.  He was discharged from the hospital yesterday after being admitted for fever and sepsis thought to be secondary to healthcare associated pneumonia.  Today fails week and has a fever of 101.6.  He returned to the emergency department at the request of his oncology team.  He denies chest pain shortness of breath.  Denies sore throat.  Denies nausea vomiting diarrhea.  Denies abdominal pain.  Denies any rash.  No other complaints at this time.  No urinary complaints.   Past Medical History:  Diagnosis Date  . Brain cancer ([place])   . Glioblastoma ([place]) 09/25/2017  . Hyperglycemia 09/25/2017  . Seizure ([place]) 09/25/2017    Patient Active Problem List   Diagnosis Date Noted  . Interstitial pneumonia ([place]) 11/11/2017  . Sepsis ([place]) 11/09/2017  . Bacteremia due to Gram-negative bacteria 10/26/2017  . Sepsis due to Escherichia coli (E. coli) ([place]) 10/26/2017  . UTI (urinary tract infection) 10/25/2017  . Seizure ([place]) 09/25/2017  . Frontal glioblastoma multiforme ([place]) 09/25/2017  . Hyperglycemia 09/25/2017  . BLOOD IN STOOL 07/26/2008  . ERECTILE DYSFUNCTION 07/12/2008    Past Surgical History:  Procedure Laterality Date  . APPLICATION OF CRANIAL NAVIGATION N/A 09/30/2017   Procedure: APPLICATION OF CRANIAL NAVIGATION;  Surgeon: [name], MD;  Location: [facility];  Service: Neurosurgery;  Laterality: N/A;  . CRANIOTOMY N/A 09/30/2017   Procedure: CRANIOTOMY FOR RESECTION OF BRAIN TUMOR WITH STEREOTACTIC;  Surgeon: [name], MD;  Location: [facility];  Service: Neurosurgery;  Laterality: N/A;  CRANIOTOMY FOR RESECTION OF BRAIN TUMOR WITH  STEREOTACTIC        Home Medications    Prior to Admission medications   Medication Sig Start Date End Date Taking? Authorizing Provider  acetaminophen (TYLENOL) 325 MG tablet Take 2 tablets (650 mg total) by mouth every 6 (six) hours as needed for mild pain (or Fever >/= 101). 10/04/17  Yes Velvet Bathe, MD  amoxicillin-clavulanate (AUGMENTIN) 875-125 MG tablet Take 1 tablet by mouth every 12 (twelve) hours. 11/12/17  Yes Hosie Poisson, MD  dexamethasone (DECADRON) 4 MG tablet Take 1 tablet (4 mg total) by mouth 2 (two) times daily. 10/17/17  Yes Vaslow, Acey Lav, MD  feeding supplement, ENSURE ENLIVE, (ENSURE ENLIVE) LIQD Take 237 mLs by mouth 2 (two) times daily between meals. 11/12/17  Yes Hosie Poisson, MD  levETIRAcetam (KEPPRA) 500 MG tablet Take 1 tablet (500 mg total) by mouth 2 (two) times daily. 10/17/17  Yes Vaslow, Acey Lav, MD  ondansetron (ZOFRAN) 8 MG tablet Take 1 tablet (8 mg total) by mouth 2 (two) times daily as needed. Start on the third day after chemotherapy. 11/07/17  Yes Vaslow, Acey Lav, MD    Family History Family History  Problem Relation Age of Onset  . Hypertension Daughter   . Stroke Mother   . Cancer Neg Hx     Social History Social History   Tobacco Use  . Smoking status: Never Smoker  . Smokeless tobacco: Never Used  Substance Use Topics  . Alcohol use: No    Frequency: Never  . Drug use: No     Allergies   Pork-derived  products and Shellfish allergy   Review of Systems Review of Systems  All other systems reviewed and are negative.    Physical Exam Updated Vital Signs BP 124/75   Pulse 82   Temp 100 F (37.8 C) (Oral)   Resp (!) 22   Ht 6\' 4"  (1.93 m)   Wt 84.4 kg (186 lb)   SpO2 98%   BMI 22.64 kg/m   Physical Exam  Constitutional: He is oriented to person, place, and time. He appears well-developed and well-nourished.  HENT:  Head: Normocephalic and atraumatic.  Eyes: EOM are normal.  Neck: Normal range of motion.    Cardiovascular: Normal rate, regular rhythm, normal heart sounds and intact distal pulses.  Pulmonary/Chest: Effort normal and breath sounds normal. No respiratory distress.  Abdominal: Soft. He exhibits no distension. There is no tenderness.  Musculoskeletal: Normal range of motion.  Neurological: He is alert and oriented to person, place, and time.  Skin: Skin is warm and dry.  Psychiatric: He has a normal mood and affect. Judgment normal.  Nursing note and vitals reviewed.    ED Treatments / Results  Labs (all labs ordered are listed, but only abnormal results are displayed) Labs Reviewed  CBC - Abnormal; Notable for the following components:      Result Value   RBC 3.33 (*)    Hemoglobin 10.6 (*)    HCT 31.4 (*)    All other components within normal limits  COMPREHENSIVE METABOLIC PANEL - Abnormal; Notable for the following components:   Glucose, Bld 128 (*)    Calcium 8.8 (*)    Total Protein 6.4 (*)    Albumin 2.5 (*)    ALT 65 (*)    All other components within normal limits  I-STAT CG4 LACTIC ACID, ED - Abnormal; Notable for the following components:   Lactic Acid, Venous 3.02 (*)    All other components within normal limits  CULTURE, BLOOD (ROUTINE X 2)  CULTURE, BLOOD (ROUTINE X 2)  URINE CULTURE  URINALYSIS, ROUTINE W REFLEX MICROSCOPIC  I-STAT CG4 LACTIC ACID, ED    EKG None  Radiology Dg Chest 2 View  Result Date: 11/13/2017 CLINICAL DATA:  Fevers EXAM: CHEST - 2 VIEW COMPARISON:  11/11/2017 FINDINGS: Cardiac shadow is stable. The lungs are well aerated bilaterally without focal infiltrate. Improved aeration in the bases is noted bilaterally. No sizable effusion is seen. No bony abnormality is noted. IMPRESSION: Resolution of previously seen interstitial changes. No focal acute abnormality is noted. Electronically Signed   By: Inez Catalina M.D.   On: 11/13/2017 12:30    Procedures Procedures (including critical care time)  Medications Ordered in  ED Medications  vancomycin (VANCOCIN) IVPB 1000 mg/200 mL premix (has no administration in time range)  piperacillin-tazobactam (ZOSYN) IVPB 3.375 g (has no administration in time range)  sodium chloride 0.9 % bolus 2,000 mL (2,000 mLs Intravenous New Bag/Given 11/13/17 1346)     Initial Impression / Assessment and Plan / ED Course  I have reviewed the triage vital signs and the nursing notes.  Pertinent labs & imaging results that were available during my care of the patient were reviewed by me and considered in my medical decision making (see chart for details).     Repeat blood and urine cultures.  Chest x-ray looks significantly improved as compared to his hospital chest x-ray.  No symptoms to suggest pneumonia.  Influenza was negative in the hospital.  Respiratory panel was negative in the hospital.  Broad-spectrum  antibiotics.  Patient will be observed in the hospital at least overnight for fever  Final Clinical Impressions(s) / ED Diagnoses   Final diagnoses:  Fever, unspecified fever cause  Sepsis, due to unspecified organism [facility])    ED Discharge Orders    None       Jola Schmidt, MD 11/13/17 [phone]

## 2017-11-13 NOTE — ED Triage Notes (Signed)
Pt d/c yesterday for sepsis. Hx of brain CA and surgery. Pt feeling weak and feverish. A&Ox4. Multiple falls since yesterday.

## 2017-11-13 NOTE — ED Notes (Signed)
Bed: TZ00 Expected date:  Expected time:  Means of arrival:  Comments: EMS/cancer/fever

## 2017-11-13 NOTE — ED Notes (Signed)
Pt aware UA needed. 

## 2017-11-13 NOTE — H&P (Signed)
History and Physical    Lauri Till VVO:[id number] DOB: Sep 15, 1940 DOA: 11/13/2017  PCP: Patient, No Pcp Per  Patient coming from: Home  I have personally briefly reviewed patient's old medical records in [place]  Chief Complaint: Fevers/Falls  HPI: Christopher Morris is a 77 y.o. male with medical history significant of recently diagnosed left frontal glioblastoma status post craniotomy 09/30/2017 per Dr. Sherwood Gambler currently receiving radiation treatment, recently hospitalized 11/09/2017 to 11/12/2017 for sepsis felt secondary to healthcare associated pneumonia.  Patient presents back to the ED with fevers and a fall.  Per patient and family patient fell last night while going to the bathroom and fell again this morning while using the bathroom.  Patient denies hitting his head.  Patient denies any chills, no nausea, no emesis, no chest pain, no shortness of breath, no cough, no abdominal pain, no diarrhea, no constipation, no dysuria, no melena, no hematemesis, no hematochezia.  Patient denies any focal neurological deficits.  Daughter however states that patient's balance has been off recently causing him to tend to fall.  Patient also endorses generalized weakness.  Patient was found on the floor by his grandson and EMS called.  Per family when EMS got the patient had a temperature of 101.6.  Patient was brought to the ED.  ED Course: In the ED patient noted to have an elevated lactic acid level of 3.02.  Comprehensive metabolic profile done had a calcium of 8.8, albumin of 2.5, ALT of 65, protein of 6.4 otherwise was within normal limits.  CBC had a hemoglobin of 10.6 otherwise was within normal limits.  Urinalysis was nitrite negative leukocytes negative.  Blood cultures were ordered and pending.  Chest x-ray done showed resolution of previously seen interstitial changes with no focal acute abnormality noted.  Patient was given a dose of IV vancomycin IV Zosyn in the ED.  [facility]  were called to admit the patient for further evaluation and management.  Review of Systems: As per HPI otherwise 10 point review of systems negative.   Past Medical History:  Diagnosis Date  . Brain cancer ([place])   . Glioblastoma ([place]) 09/25/2017  . Hyperglycemia 09/25/2017  . Seizure ([place]) 09/25/2017    Past Surgical History:  Procedure Laterality Date  . APPLICATION OF CRANIAL NAVIGATION N/A 09/30/2017   Procedure: APPLICATION OF CRANIAL NAVIGATION;  Surgeon: [name], MD;  Location: [facility];  Service: Neurosurgery;  Laterality: N/A;  . CRANIOTOMY N/A 09/30/2017   Procedure: CRANIOTOMY FOR RESECTION OF BRAIN TUMOR WITH STEREOTACTIC;  Surgeon: [name], MD;  Location: [facility];  Service: Neurosurgery;  Laterality: N/A;  CRANIOTOMY FOR RESECTION OF BRAIN TUMOR WITH STEREOTACTIC     reports that he has never smoked. He has never used smokeless tobacco. He reports that he does not drink alcohol or use drugs.  Allergies  Allergen Reactions  . Pork-Derived Products     Patient does not eat pork  . Shellfish Allergy     Patient does not eat shellfish    Family History  Problem Relation Age of Onset  . Hypertension Daughter   . Stroke Mother   . Cancer Neg Hx    Mother deceased age 59 from a stroke.  Father deceased in the 74s cause unknown per patient.  Prior to Admission medications   Medication Sig Start Date End Date Taking? Authorizing Provider  acetaminophen (TYLENOL) 325 MG tablet Take 2 tablets (650 mg total) by mouth every 6 (six) hours as needed for mild pain (  or Fever >/= 101). 10/04/17  Yes Velvet Bathe, MD  amoxicillin-clavulanate (AUGMENTIN) 875-125 MG tablet Take 1 tablet by mouth every 12 (twelve) hours. 11/12/17  Yes Hosie Poisson, MD  dexamethasone (DECADRON) 4 MG tablet Take 1 tablet (4 mg total) by mouth 2 (two) times daily. 10/17/17  Yes Vaslow, Acey Lav, MD  feeding supplement, ENSURE ENLIVE, (ENSURE ENLIVE) LIQD Take 237 mLs by mouth 2 (two) times daily  between meals. 11/12/17  Yes Hosie Poisson, MD  levETIRAcetam (KEPPRA) 500 MG tablet Take 1 tablet (500 mg total) by mouth 2 (two) times daily. 10/17/17  Yes Vaslow, Acey Lav, MD  ondansetron (ZOFRAN) 8 MG tablet Take 1 tablet (8 mg total) by mouth 2 (two) times daily as needed. Start on the third day after chemotherapy. 11/07/17  Yes Ventura Sellers, MD    Physical Exam: Vitals:   11/13/17 1510 11/13/17 1530 11/13/17 1600 11/13/17 1630  BP:  108/68 124/75 98/87  Pulse: 73 76 82 81  Resp: 19 13 (!) 22 20  Temp:      TempSrc:      SpO2: 98% 98% 98% 99%  Weight:      Height:        Constitutional: NAD, calm, comfortable Vitals:   11/13/17 1510 11/13/17 1530 11/13/17 1600 11/13/17 1630  BP:  108/68 124/75 98/87  Pulse: 73 76 82 81  Resp: 19 13 (!) 22 20  Temp:      TempSrc:      SpO2: 98% 98% 98% 99%  Weight:      Height:       Eyes: PERRL, lids and conjunctivae normal ENMT: Mucous membranes are dry. Posterior pharynx clear of any exudate or lesions.Normal dentition.  Neck: normal, supple, no masses, no thyromegaly Respiratory: clear to auscultation bilaterally, no wheezing, no crackles. Normal respiratory effort. No accessory muscle use.  Cardiovascular: Regular rate and rhythm, no murmurs / rubs / gallops. No extremity edema. 2+ pedal pulses. No carotid bruits.  Abdomen: no tenderness, no masses palpated. No hepatosplenomegaly. Bowel sounds positive.  Musculoskeletal: no clubbing / cyanosis. No joint deformity upper and lower extremities. Good ROM, no contractures. Normal muscle tone.  Skin: no rashes, lesions, ulcers. No induration Neurologic: CN 2-12 grossly intact. Sensation intact, DTR normal. Strength 5/5 in all 4.  Gait not tested secondary to safety. Psychiatric: Normal judgment and insight. Alert and oriented x 3. Normal mood.   Labs on Admission: I have personally reviewed following labs and imaging studies  CBC: Recent Labs  Lab 11/07/17 0828 11/09/17 1207  11/10/17 1031 11/13/17 1205  WBC 8.8 6.6 6.5 7.2  NEUTROABS 6.7* 5.6 5.2  --   HGB 12.0* 10.7* 9.9* 10.6*  HCT 36.1* 32.4* 30.3* 31.4*  MCV 97.8 96.1 97.1 94.3  PLT 127* 137* 131* 381   Basic Metabolic Panel: Recent Labs  Lab 11/07/17 0828 11/09/17 1207 11/10/17 1031 11/13/17 1205  NA 140 137 141 140  K 4.1 3.9 4.5 4.1  CL 102 101 105 103  CO2 25 22 26 26   GLUCOSE 183* 178* 138* 128*  BUN 12 17 14 16   CREATININE 0.92 1.03 0.77 0.85  CALCIUM 9.1 8.6* 8.6* 8.8*   GFR: Estimated Creatinine Clearance: 84.1 mL/min (by C-G formula based on SCr of 0.85 mg/dL). Liver Function Tests: Recent Labs  Lab 11/07/17 0828 11/09/17 1207 11/13/17 1205  AST 19 28 32  ALT 36 43 65*  ALKPHOS 76 77 58  BILITOT 0.9 0.8 0.9  PROT 6.5 5.9* 6.4*  ALBUMIN 2.9* 2.7* 2.5*   No results for input(s): LIPASE, AMYLASE in the last 168 hours. No results for input(s): AMMONIA in the last 168 hours. Coagulation Profile: No results for input(s): INR, PROTIME in the last 168 hours. Cardiac Enzymes: No results for input(s): CKTOTAL, CKMB, CKMBINDEX, TROPONINI in the last 168 hours. BNP (last 3 results) No results for input(s): PROBNP in the last 8760 hours. HbA1C: No results for input(s): HGBA1C in the last 72 hours. CBG: No results for input(s): GLUCAP in the last 168 hours. Lipid Profile: No results for input(s): CHOL, HDL, LDLCALC, TRIG, CHOLHDL, LDLDIRECT in the last 72 hours. Thyroid Function Tests: No results for input(s): TSH, T4TOTAL, FREET4, T3FREE, THYROIDAB in the last 72 hours. Anemia Panel: No results for input(s): VITAMINB12, FOLATE, FERRITIN, TIBC, IRON, RETICCTPCT in the last 72 hours. Urine analysis:    Component Value Date/Time   COLORURINE YELLOW 11/09/2017 1521   APPEARANCEUR CLEAR 11/09/2017 1521   LABSPEC 1.010 11/09/2017 1521   PHURINE 6.0 11/09/2017 1521   GLUCOSEU NEGATIVE 11/09/2017 1521   HGBUR NEGATIVE 11/09/2017 1521   BILIRUBINUR NEGATIVE 11/09/2017 1521    KETONESUR NEGATIVE 11/09/2017 1521   PROTEINUR NEGATIVE 11/09/2017 1521   NITRITE NEGATIVE 11/09/2017 1521   LEUKOCYTESUR NEGATIVE 11/09/2017 1521    Radiological Exams on Admission: Dg Chest 2 View  Result Date: 11/13/2017 CLINICAL DATA:  Fevers EXAM: CHEST - 2 VIEW COMPARISON:  11/11/2017 FINDINGS: Cardiac shadow is stable. The lungs are well aerated bilaterally without focal infiltrate. Improved aeration in the bases is noted bilaterally. No sizable effusion is seen. No bony abnormality is noted. IMPRESSION: Resolution of previously seen interstitial changes. No focal acute abnormality is noted. Electronically Signed   By: Inez Catalina M.D.   On: 11/13/2017 12:30    EKG: None  Assessment/Plan Principal Problem:   Fever Active Problems:   Seizure (HCC)   Frontal glioblastoma multiforme (HCC)   Falls   Dehydration   #1 fevers/lactic acidosis Questionable etiology.  Patient recently admitted for fever and sepsis felt secondary to healthcare associated pneumonia.  Patient received 2 days of IV antibiotics and transition to oral antibiotics on discharge.  Blood cultures have been obtained and are currently pending.  Urinalysis seems unremarkable, urine culture is pending.  Patient noted to have an elevated lactic acid level and as such we will repeat lactic acid levels in the morning after hydration and empiric IV antibiotics.  Check a CT abdomen and pelvis.  Place empirically on IV vancomycin and IV Zosyn.  IV fluids.  2.  Falls Likely secondary to recently diagnosed frontal glioblastoma multiforme.  Check CT head.  PT/OT.  Follow.  3.  Dehydration IV fluids.  4.  Recently diagnosed left frontal glioblastoma multiforme Status post craniotomy per Dr. Sherwood Gambler 09/30/2017.  Patient currently receiving daily radiation treatments.  Will inform radiation oncology of patient's admission.  Continue home regimen of Decadron, Keppra.  Will inform patient's oncologist of admission.  Outpatient  follow-up with oncology.  5.  Seizures Secondary to #4.  Continue Keppra.  Monitor.  DVT prophylaxis: Lovenox Code Status: Full Family Communication: Updated patient and daughter and grandson at bedside. Disposition Plan: Pending hospitalization.  Either home with home health versus skilled nursing facility. Consults called: None Admission status: Admit to inpatient.   Irine Seal MD [facility] Pager [phone]  If 7PM-7AM, please contact night-coverage [email or web address] Password [email or web address]  11/13/2017, 5:00 PM

## 2017-11-14 ENCOUNTER — Ambulatory Visit: Payer: BLUE CROSS/BLUE SHIELD | Admitting: Internal Medicine

## 2017-11-14 ENCOUNTER — Other Ambulatory Visit: Payer: BLUE CROSS/BLUE SHIELD

## 2017-11-14 ENCOUNTER — Ambulatory Visit
Admission: RE | Admit: 2017-11-14 | Discharge: 2017-11-14 | Disposition: A | Discharge status: Home / Self Care | Payer: BLUE CROSS/BLUE SHIELD | Source: Ambulatory Visit | Attending: Radiation Oncology | Admitting: Radiation Oncology

## 2017-11-14 DIAGNOSIS — E538 Deficiency of other specified B group vitamins: Secondary | ICD-10-CM | POA: Diagnosis present

## 2017-11-14 DIAGNOSIS — Z510 Encounter for antineoplastic radiation therapy: Secondary | ICD-10-CM | POA: Insufficient documentation

## 2017-11-14 DIAGNOSIS — D649 Anemia, unspecified: Secondary | ICD-10-CM | POA: Diagnosis present

## 2017-11-14 DIAGNOSIS — C711 Malignant neoplasm of frontal lobe: Secondary | ICD-10-CM | POA: Insufficient documentation

## 2017-11-14 DIAGNOSIS — W19XXXD Unspecified fall, subsequent encounter: Secondary | ICD-10-CM

## 2017-11-14 LAB — BLOOD CULTURE ID PANEL (REFLEXED)
Acinetobacter baumannii: NOT DETECTED
CANDIDA GLABRATA: NOT DETECTED
CANDIDA TROPICALIS: NOT DETECTED
Candida albicans: NOT DETECTED
Candida krusei: NOT DETECTED
Candida parapsilosis: NOT DETECTED
Enterobacter cloacae complex: NOT DETECTED
Enterobacteriaceae species: NOT DETECTED
Enterococcus species: NOT DETECTED
Escherichia coli: NOT DETECTED
HAEMOPHILUS INFLUENZAE: NOT DETECTED
KLEBSIELLA PNEUMONIAE: NOT DETECTED
Klebsiella oxytoca: NOT DETECTED
LISTERIA MONOCYTOGENES: NOT DETECTED
METHICILLIN RESISTANCE: DETECTED — AB
NEISSERIA MENINGITIDIS: NOT DETECTED
PROTEUS SPECIES: NOT DETECTED
Pseudomonas aeruginosa: NOT DETECTED
SERRATIA MARCESCENS: NOT DETECTED
STAPHYLOCOCCUS AUREUS BCID: NOT DETECTED
STAPHYLOCOCCUS SPECIES: DETECTED — AB
STREPTOCOCCUS AGALACTIAE: NOT DETECTED
STREPTOCOCCUS SPECIES: NOT DETECTED
Streptococcus pneumoniae: NOT DETECTED
Streptococcus pyogenes: NOT DETECTED

## 2017-11-14 LAB — CULTURE, BLOOD (ROUTINE X 2)
CULTURE: NO GROWTH
Culture: NO GROWTH

## 2017-11-14 LAB — BASIC METABOLIC PANEL
ANION GAP: 10 (ref 5–15)
BUN: 17 mg/dL (ref 6–20)
CALCIUM: 8.7 mg/dL — AB (ref 8.9–10.3)
CO2: 24 mmol/L (ref 22–32)
CREATININE: 0.69 mg/dL (ref 0.61–1.24)
Chloride: 104 mmol/L (ref 101–111)
GFR calc Af Amer: >60 mL/min (ref >60)
GFR calc non Af Amer: >60 mL/min (ref >60)
GLUCOSE: 160 mg/dL — AB (ref 65–99)
Potassium: 4.4 mmol/L (ref 3.5–5.1)
Sodium: 138 mmol/L (ref 135–145)

## 2017-11-14 LAB — CBC
HCT: 28.6 % — ABNORMAL LOW (ref 39.0–52.0)
HEMOGLOBIN: 9.4 g/dL — AB (ref 13.0–17.0)
MCH: 31.2 pg (ref 26.0–34.0)
MCHC: 32.9 g/dL (ref 30.0–36.0)
MCV: 95 fL (ref 78.0–100.0)
PLATELETS: 159 10*3/uL (ref 150–400)
RBC: 3.01 MIL/uL — ABNORMAL LOW (ref 4.22–5.81)
RDW: 13.1 % (ref 11.5–15.5)
WBC: 6.3 10*3/uL (ref 4.0–10.5)

## 2017-11-14 LAB — VITAMIN B12: Vitamin B-12: <50 pg/mL — ABNORMAL LOW (ref 180–914)

## 2017-11-14 LAB — IRON AND TIBC
Iron: 41 ug/dL — ABNORMAL LOW (ref 45–182)
Saturation Ratios: 22 % (ref 17.9–39.5)
TIBC: 183 ug/dL — ABNORMAL LOW (ref 250–450)
UIBC: 142 ug/dL

## 2017-11-14 LAB — FERRITIN: Ferritin: 1313 ng/mL — ABNORMAL HIGH (ref 24–336)

## 2017-11-14 LAB — FOLATE: FOLATE: 16.3 ng/mL (ref >5.9)

## 2017-11-14 LAB — GLUCOSE, CAPILLARY: GLUCOSE-CAPILLARY: 150 mg/dL — AB (ref 65–99)

## 2017-11-14 LAB — LACTIC ACID, PLASMA: LACTIC ACID, VENOUS: 1.5 mmol/L (ref 0.5–1.9)

## 2017-11-14 MED ORDER — CYANOCOBALAMIN 1000 MCG/ML IJ SOLN
1000.0000 ug | Freq: Every day | INTRAMUSCULAR | Status: DC
  Administered 2017-11-14 – 2017-11-16 (×3): 1000 ug via INTRAMUSCULAR
  Filled 2017-11-14 (×3): qty 1

## 2017-11-14 NOTE — Evaluation (Signed)
Occupational Therapy Evaluation Patient Details Name: Christopher Morris MRN: [id number] DOB: 11/30/1940 Today's Date: 11/14/2017    History of Present Illness Christopher Morris is a 77 y.o. male with recently diagnosed glioblastoma s/p craniotomy and radiation admitted on 11/13/2017 with fever, weakness, and falls.   Clinical Impression   Pt admitted with fever, weakness and falls. Pt currently with functional limitations due to the deficits listed below (see OT Problem List).  Pt will benefit from skilled OT to increase their safety and independence with ADL and functional mobility for ADL to facilitate discharge to venue listed below.      Follow Up Recommendations  Home health OT;Supervision/Assistance - 24 hour    Equipment Recommendations  None recommended by OT    Recommendations for Other Services       Precautions / Restrictions Precautions Precautions: Fall Restrictions Weight Bearing Restrictions: No      Mobility Bed Mobility Overal bed mobility: Needs Assistance Bed Mobility: Supine to Sit     Supine to sit: Supervision     General bed mobility comments: patient sitting in recliner at beginning and end of session.  Transfers Overall transfer level: Needs assistance Equipment used: None Transfers: Sit to/from [facility] Sit to Stand: Min guard Stand pivot transfers: Min guard       General transfer comment: wide BOS observed    Balance Overall balance assessment: Needs assistance Sitting-balance support: No upper extremity supported Sitting balance-Leahy Scale: Good     Standing balance support: Single extremity supported Standing balance-Leahy Scale: Fair                             ADL either performed or assessed with clinical judgement   ADL Overall ADL's : Needs assistance/impaired Eating/Feeding: Set up;Sitting   Grooming: Set up;Sitting   Upper Body Bathing: Set up;Sitting   Lower Body Bathing: Moderate  assistance;Sit to/from stand;Cueing for sequencing;Cueing for safety   Upper Body Dressing : Set up;Sitting   Lower Body Dressing: Moderate assistance;Sit to/from stand   Toilet Transfer: Minimal assistance;Ambulation;Stand-pivot;Cueing for sequencing   Toileting- Clothing Manipulation and Hygiene: Minimal assistance;Sit to/from stand;Cueing for sequencing;Cueing for safety               Vision         Perception     Praxis      Pertinent Vitals/Pain Pain Assessment: No/denies pain     Hand Dominance     Extremity/Trunk Assessment Upper Extremity Assessment Upper Extremity Assessment: Defer to OT evaluation   Lower Extremity Assessment Lower Extremity Assessment: Generalized weakness   Cervical / Trunk Assessment Cervical / Trunk Assessment: Normal   Communication Communication Communication: No difficulties   Cognition Arousal/Alertness: Awake/alert Behavior During Therapy: WFL for tasks assessed/performed Overall Cognitive Status: Within Functional Limits for tasks assessed                                     General Comments       Exercises     Shoulder Instructions      Home Living Family/patient expects to be discharged to:: Private residence Living Arrangements: Children;Other relatives Available Help at Discharge: Family;Available PRN/intermittently Type of Home: Apartment Home Access: Level entry     Home Layout: One level     Bathroom Shower/Tub: Walk-in shower         Home Equipment: None  Additional Comments: no family present during eval, pt with recent admission as above, pt reports today that daughter does not work and can be home 24/7      Prior Functioning/Environment Level of Independence: Independent  Gait / Transfers Assistance Needed: ambulatory without a device     Comments: recent short stay at SNF s/p craniotomy 09/2017        OT Problem List: Decreased strength;Decreased activity  tolerance;Decreased safety awareness;Impaired balance (sitting and/or standing)      OT Treatment/Interventions: Self-care/ADL training;Patient/family education;Therapeutic activities    OT Goals(Current goals can be found in the care plan section) Acute Rehab OT Goals Patient Stated Goal: return home OT Goal Formulation: With patient Time For Goal Achievement: 11/21/17 Potential to Achieve Goals: Good  OT Frequency: Min 2X/week   Barriers to D/C:            Co-evaluation              AM-PAC PT "6 Clicks" Daily Activity     Outcome Measure Help from another person eating meals?: None Help from another person taking care of personal grooming?: A Little Help from another person toileting, which includes using toliet, bedpan, or urinal?: A Little Help from another person bathing (including washing, rinsing, drying)?: A Little Help from another person to put on and taking off regular upper body clothing?: A Little Help from another person to put on and taking off regular lower body clothing?: A Little 6 Click Score: 19   End of Session Equipment Utilized During Treatment: Gait belt Nurse Communication: Mobility status  Activity Tolerance: Patient tolerated treatment well Patient left: in chair;with call bell/phone within reach  OT Visit Diagnosis: Unsteadiness on feet (R26.81);History of falling (Z91.81);Other abnormalities of gait and mobility (R26.89)                Time: 2458-0998 OT Time Calculation (min): 17 min Charges:  OT General Charges $OT Visit: 1 Visit OT Evaluation $OT Eval Moderate Complexity: 1 Mod G-Codes:     Kari Baars, [place] [phone]  Payton Mccallum D 11/14/2017, 12:33 PM

## 2017-11-14 NOTE — Progress Notes (Signed)
[facility] Therapy Treatment Record Phone [phone]   Radiation Therapy was administered to Christopher Morris on: 11/14/2017  2:37 PM and was treatment # 13 out of a planned course of 23 treatments.  Radiation Treatment  1). Beam photons with 6-10 energy  2). Brachytherapy None  3). Stereotactic Radiosurgery None  4). Other Radiation None     Jacques Earthly, RT (T)

## 2017-11-14 NOTE — Progress Notes (Signed)
Nutrition Brief Note  Patient identified on the Malnutrition Screening Tool (MST) Report  Wt Readings from Last 15 Encounters:  11/14/17 186 lb 8.2 oz (84.6 kg)  11/09/17 186 lb 15.2 oz (84.8 kg)  11/07/17 187 lb 1.6 oz (84.9 kg)  10/25/17 192 lb 14.4 oz (87.5 kg)  10/21/17 190 lb 14.7 oz (86.6 kg)  10/17/17 188 lb 3.2 oz (85.4 kg)  10/17/17 188 lb 9.6 oz (85.5 kg)  10/07/17 190 lb 14.7 oz (86.6 kg)  09/30/17 190 lb 14.7 oz (86.6 kg)  05/03/16 210 lb (95.3 kg)   Patient with PMH significant for glioblastoma on Decadron and history of seizures. Recently admitted with PNA. Presents this admission with fevers and falls. Pt continues to deny any recent loss of appetite or unintentional wt loss. Weight stable from last admission. No skin breakdown recorded. Pt had eggs, sausage, and fruit without any complication this morning.   Body mass index is 22.7 kg/m. Patient meets criteria for normal based on current BMI.   Current diet order is regular, patient is consuming approximately 100% of meals at this time. Labs and medications reviewed.   No nutrition interventions warranted at this time. If nutrition issues arise, please consult RD.   Mariana Single RD, LDN Clinical Nutrition Pager # [phone]

## 2017-11-14 NOTE — Progress Notes (Signed)
PROGRESS NOTE    Christopher Morris  YWV:[id number] DOB: Feb 17, 1941 DOA: 11/13/2017 PCP: Patient, No Pcp Per   Brief Narrative: Patient is 77 year old gentleman recent diagnosis of glioblastoma status post craniotomy 09/30/2017 on radiation therapy recently hospitalized 11/09/2017-11/12/2017 for sepsis secondary to healthcare associated pneumonia presented back to the ED with fevers and falls.  Patient pancultured.  Patient placed empirically on IV vancomycin IV Zosyn.   Assessment & Plan:   Principal Problem:   Fever Active Problems:   Seizure (HCC)   Frontal glioblastoma multiforme (HCC)   Falls   Dehydration   Vitamin B12 deficiency   Anemia  #1 fevers/lactic acidosis Questionable etiology.  Patient recently admitted for fever and sepsis felt secondary to healthcare associated pneumonia.    Patient also noted during last hospitalization of April to 12 to April 15 of 2019 patient at that time admitted for an E. coli bacteremia felt secondary to E. coli UTI and initially treated with IV antibiotics and subsequently transitioned to oral ciprofloxacin on discharge.  Patient received 2 days of IV antibiotics and transition to oral antibiotics prior recent discharge of November 12, 2017.  Patient has been pancultured.  Preliminary blood cultures with gram-positive cocci pending.  Urinalysis is unremarkable.  Lactic acid level improved.  CT abdomen and pelvis with no acute abnormalities noted.  Patient currently afebrile since admission.  Continue empiric IV vancomycin IV Zosyn.  IV fluids.  Supportive care.  2.  Falls Likely secondary to recently diagnosed frontal glioblastoma multiforme.  CT head with no acute abnormalities.  PT/OT.Follow.  3.  Dehydration IV fluids.  4.  Recently diagnosed left frontal glioblastoma multiforme Status post craniotomy per Dr. Sherwood Gambler 09/30/2017.  Patient currently receiving daily radiation treatments.  Radiation oncology has been informed of patient's admission   and patient likely to undergo continued radiation treatments while in-house.  Continue current home regimen of Decadron and Keppra.  Patient's neuro oncologist has been informed via epic of patient's admission.   5.  Seizures Secondary to #4.  No seizures noted during his hospitalization.  Continue seizure prophylaxis with Keppra.  #  6 vitamin B12 deficiency Vitamin B12 levels less than 50.  Place on vitamin B12 IM injections daily x1 week, and then weekly x1 month, and then monthly.  Will need outpatient follow-up.  7.  Anemia Questionable etiology.  Anemia panel consistent with anemia of chronic disease as well as a vitamin B12 deficiency.  See #6.  Patient with no overt bleeding.  Hemoglobin currently stable at 9.4.  Follow H&H.    DVT prophylaxis: Lovenox Code Status: Full Family Communication: Updated patient and daughter. Disposition Plan: Likely home with home health once clinically improved medically stable blood cultures finalized and afebrile for 48 hours.   Consultants:   Radiation oncology  Procedures:   CT head 11/13/2017  CT abdomen and pelvis 11/13/2017  Chest x-ray 11/13/2017  Antimicrobials:   IV Zosyn 11/13/2017  IV vancomycin 11/13/2017   Subjective: Sitting up in chair.  Denies any chest pain or shortness of breath.  No nausea or vomiting.  States weakness has improved.  Objective: Vitals:   11/13/17 2048 11/14/17 0446 11/14/17 0655 11/14/17 1519  BP: 135/74 118/77  125/72  Pulse: 80 81  74  Resp: 18 18  (!) 24  Temp: 99.3 F (37.4 C) 98.7 F (37.1 C)  97.6 F (36.4 C)  TempSrc: Oral Oral  Oral  SpO2: 96% 97%  95%  Weight:   84.6 kg (186 lb 8.2 oz)  Height:        Intake/Output Summary (Last 24 hours) at 11/14/2017 2035 Last data filed at 11/14/2017 1857 Gross per 24 hour  Intake 490 ml  Output 2550 ml  Net -2060 ml   Filed Weights   11/13/17 1116 11/14/17 0655  Weight: 84.4 kg (186 lb) 84.6 kg (186 lb 8.2 oz)    Examination:  General  exam: Appears calm and comfortable  Respiratory system: Clear to auscultation. Respiratory effort normal. Cardiovascular system: S1 & S2 heard, RRR. No JVD, murmurs, rubs, gallops or clicks. No pedal edema. Gastrointestinal system: Abdomen is nondistended, soft and nontender. No organomegaly or masses felt. Normal bowel sounds heard. Central nervous system: Alert and oriented. No focal neurological deficits. Extremities: Symmetric 5 x 5 power. Skin: No rashes, lesions or ulcers Psychiatry: Judgement and insight appear normal. Mood & affect appropriate.     Data Reviewed: I have personally reviewed following labs and imaging studies  CBC: Recent Labs  Lab 11/09/17 1207 11/10/17 1031 11/13/17 1205 11/14/17 0629  WBC 6.6 6.5 7.2 6.3  NEUTROABS 5.6 5.2  --   --   HGB 10.7* 9.9* 10.6* 9.4*  HCT 32.4* 30.3* 31.4* 28.6*  MCV 96.1 97.1 94.3 95.0  PLT 137* 131* 168 299   Basic Metabolic Panel: Recent Labs  Lab 11/09/17 1207 11/10/17 1031 11/13/17 1205 11/14/17 0629  NA 137 141 140 138  K 3.9 4.5 4.1 4.4  CL 101 105 103 104  CO2 22 26 26 24   GLUCOSE 178* 138* 128* 160*  BUN 17 14 16 17   CREATININE 1.03 0.77 0.85 0.69  CALCIUM 8.6* 8.6* 8.8* 8.7*  MG  --   --  1.9  --    GFR: Estimated Creatinine Clearance: 89.6 mL/min (by C-G formula based on SCr of 0.69 mg/dL). Liver Function Tests: Recent Labs  Lab 11/09/17 1207 11/13/17 1205  AST 28 32  ALT 43 65*  ALKPHOS 77 58  BILITOT 0.8 0.9  PROT 5.9* 6.4*  ALBUMIN 2.7* 2.5*   No results for input(s): LIPASE, AMYLASE in the last 168 hours. No results for input(s): AMMONIA in the last 168 hours. Coagulation Profile: No results for input(s): INR, PROTIME in the last 168 hours. Cardiac Enzymes: No results for input(s): CKTOTAL, CKMB, CKMBINDEX, TROPONINI in the last 168 hours. BNP (last 3 results) No results for input(s): PROBNP in the last 8760 hours. HbA1C: No results for input(s): HGBA1C in the last 72  hours. CBG: Recent Labs  Lab 11/14/17 0754  GLUCAP 150*   Lipid Profile: No results for input(s): CHOL, HDL, LDLCALC, TRIG, CHOLHDL, LDLDIRECT in the last 72 hours. Thyroid Function Tests: No results for input(s): TSH, T4TOTAL, FREET4, T3FREE, THYROIDAB in the last 72 hours. Anemia Panel: Recent Labs    11/14/17 0629  VITAMINB12 <50*  FOLATE 16.3  FERRITIN 1,313*  TIBC 183*  IRON 41*   Sepsis Labs: Recent Labs  Lab 11/12/17 1026 11/12/17 1302 11/13/17 1213 11/14/17 0629  LATICACIDVEN 3.8* 1.9 3.02* 1.5    Recent Results (from the past 240 hour(s))  Blood Culture (routine x 2)     Status: None   Collection Time: 11/09/17 12:10 PM  Result Value Ref Range Status   Specimen Description   Final    BLOOD BLOOD RIGHT FOREARM Performed at [facility], [place] [address], [place], [place] [address]    Special Requests   Final    BOTTLES DRAWN AEROBIC AND ANAEROBIC Blood Culture results may not be optimal due  to an excessive volume of blood received in culture bottles Performed at [facility] [address] [place] [address], [place], [place] [address]    Culture   Final    NO GROWTH 5 DAYS Performed at [facility], [place] [address] [place], [place] [address]    Report Status 11/14/2017 FINAL  Final  Blood Culture (routine x 2)     Status: None   Collection Time: 11/09/17 12:12 PM  Result Value Ref Range Status   Specimen Description   Final    BLOOD RIGHT ANTECUBITAL Performed at [facility] [address] [place], [place] [address]    Special Requests   Final    BOTTLES DRAWN AEROBIC AND ANAEROBIC Blood Culture results may not be optimal due to an excessive volume of blood received in culture bottles Performed at [facility] [address] [place], [place] [address]    Culture   Final    NO GROWTH 5 DAYS Performed at [facility], [place] [address] [place], [place] [address]    Report  Status 11/14/2017 FINAL  Final  Urine culture     Status: None   Collection Time: 11/09/17  3:21 PM  Result Value Ref Range Status   Specimen Description   Final    URINE, RANDOM Performed at [facility] [address], [place], [place] [address]    Special Requests   Final    NONE Performed at [facility], [place] [address] [place], [place] [address]    Culture   Final    NO GROWTH Performed at [facility], [place] [address] [place], [place] [address]    Report Status 11/10/2017 FINAL  Final  Respiratory Panel by PCR     Status: None   Collection Time: 11/09/17  5:45 PM  Result Value Ref Range Status   Adenovirus NOT DETECTED NOT DETECTED Final   Coronavirus 229E NOT DETECTED NOT DETECTED Final   Coronavirus HKU1 NOT DETECTED NOT DETECTED Final   Coronavirus NL63 NOT DETECTED NOT DETECTED Final   Coronavirus OC43 NOT DETECTED NOT DETECTED Final   Metapneumovirus NOT DETECTED NOT DETECTED Final   Rhinovirus / Enterovirus NOT DETECTED NOT DETECTED Final   Influenza A NOT DETECTED NOT DETECTED Final   Influenza B NOT DETECTED NOT DETECTED Final   Parainfluenza Virus 1 NOT DETECTED NOT DETECTED Final   Parainfluenza Virus 2 NOT DETECTED NOT DETECTED Final   Parainfluenza Virus 3 NOT DETECTED NOT DETECTED Final   Parainfluenza Virus 4 NOT DETECTED NOT DETECTED Final   Respiratory Syncytial Virus NOT DETECTED NOT DETECTED Final   Bordetella pertussis NOT DETECTED NOT DETECTED Final   Chlamydophila pneumoniae NOT DETECTED NOT DETECTED Final   Mycoplasma pneumoniae NOT DETECTED NOT DETECTED Final    Comment: Performed at [facility], [place] [address] [place], [place] [address]  Culture, sputum-assessment     Status: None   Collection Time: 11/10/17  9:51 PM  Result Value Ref Range Status   Specimen Description SPUTUM  Final   Special Requests NONE  Final   Sputum evaluation   Final    Sputum specimen not acceptable for testing.   Please recollect.   RESULT CALLED TO, READ BACK BY AND VERIFIED WITH: JOHN HOLDER AT 2340 ON 11/10/17 BY MOHAMED,A Performed at [facility], [place] [address] [place], [place] [address]    Report Status 11/10/2017 FINAL  Final  Blood  culture (routine x 2)     Status: None (Preliminary result)   Collection Time: 11/13/17 12:06 PM  Result Value Ref Range Status   Specimen Description   Final    BLOOD LEFT ANTECUBITAL Performed at [facility] [address] [place], [place] [address]    Special Requests   Final    BOTTLES DRAWN AEROBIC AND ANAEROBIC Blood Culture results may not be optimal due to an excessive volume of blood received in culture bottles Performed at [facility] [address] [place], [place] [address]    Culture  Setup Time   Final    GRAM POSITIVE COCCI IN BOTH AEROBIC AND ANAEROBIC BOTTLES Organism ID to follow CRITICAL RESULT CALLED TO, READ BACK BY AND VERIFIED WITH: Arvella Merles PharmD 9:50 11/14/17 ([email or web address]) Performed at [facility], [place] [address], [place], [place] [address]    Culture GRAM POSITIVE COCCI  Final   Report Status PENDING  Incomplete  Blood culture (routine x 2)     Status: None (Preliminary result)   Collection Time: 11/13/17 12:06 PM  Result Value Ref Range Status   Specimen Description   Final    BLOOD RIGHT ANTECUBITAL Performed at [facility] [address] [place], [place] [address]    Special Requests   Final    BOTTLES DRAWN AEROBIC AND ANAEROBIC Blood Culture results may not be optimal due to an excessive volume of blood received in culture bottles Performed at [facility] [address], [place], [place] [address]    Culture   Final    NO GROWTH 1 DAY Performed at [facility], [place] [address], [place], [place] [address]    Report Status PENDING  Incomplete  Blood Culture ID Panel (Reflexed)     Status: Abnormal    Collection Time: 11/13/17 12:06 PM  Result Value Ref Range Status   Enterococcus species NOT DETECTED NOT DETECTED Final   Listeria monocytogenes NOT DETECTED NOT DETECTED Final   Staphylococcus species DETECTED (A) NOT DETECTED Final    Comment: Methicillin (oxacillin) resistant coagulase negative staphylococcus. Possible blood culture contaminant (unless isolated from more than one blood culture draw or clinical case suggests pathogenicity). No antibiotic treatment is indicated for blood  culture contaminants. CRITICAL RESULT CALLED TO, READ BACK BY AND VERIFIED WITH: Arvella Merles PharmD 9:50 11/14/17 ([email or web address])    Staphylococcus aureus NOT DETECTED NOT DETECTED Final   Methicillin resistance DETECTED (A) NOT DETECTED Final    Comment: CRITICAL RESULT CALLED TO, READ BACK BY AND VERIFIED WITH: Arvella Merles PharmD 9:50 11/14/17 ([email or web address])    Streptococcus species NOT DETECTED NOT DETECTED Final   Streptococcus agalactiae NOT DETECTED NOT DETECTED Final   Streptococcus pneumoniae NOT DETECTED NOT DETECTED Final   Streptococcus pyogenes NOT DETECTED NOT DETECTED Final   Acinetobacter baumannii NOT DETECTED NOT DETECTED Final   Enterobacteriaceae species NOT DETECTED NOT DETECTED Final   Enterobacter cloacae complex NOT DETECTED NOT DETECTED Final   Escherichia coli NOT DETECTED NOT DETECTED Final   Klebsiella oxytoca NOT DETECTED NOT DETECTED Final   Klebsiella pneumoniae NOT DETECTED NOT DETECTED Final   Proteus species NOT DETECTED NOT DETECTED Final   Serratia marcescens NOT DETECTED NOT DETECTED Final   Haemophilus influenzae NOT DETECTED NOT DETECTED Final   Neisseria meningitidis NOT DETECTED NOT DETECTED Final   Pseudomonas aeruginosa NOT DETECTED NOT DETECTED Final   Candida albicans NOT DETECTED NOT DETECTED Final   Candida glabrata  NOT DETECTED NOT DETECTED Final   Candida krusei NOT DETECTED NOT DETECTED Final   Candida parapsilosis NOT DETECTED NOT DETECTED Final   Candida tropicalis  NOT DETECTED NOT DETECTED Final    Comment: Performed at [facility], [place] [address] [place], [place] [address]         Radiology Studies: Dg Chest 2 View  Result Date: 11/13/2017 CLINICAL DATA:  Fevers EXAM: CHEST - 2 VIEW COMPARISON:  11/11/2017 FINDINGS: Cardiac shadow is stable. The lungs are well aerated bilaterally without focal infiltrate. Improved aeration in the bases is noted bilaterally. No sizable effusion is seen. No bony abnormality is noted. IMPRESSION: Resolution of previously seen interstitial changes. No focal acute abnormality is noted. Electronically Signed   By: Inez Catalina M.D.   On: 11/13/2017 12:30   Ct Head Wo Contrast  Result Date: 11/13/2017 CLINICAL DATA:  77 y/o M; fever and weakness. History of glioblastoma. EXAM: CT HEAD WITHOUT CONTRAST TECHNIQUE: Contiguous axial images were obtained from the base of the skull through the vertex without intravenous contrast. COMPARISON:  10/25/2017 CT head.  10/02/2017 MRI head. FINDINGS: Brain: Left paramedian frontal lobe resection cavity, surrounding edema, and associated focal mass effect is stable. Associated mass effect no evidence for stroke, hemorrhage, or new focal mass effect in the brain. No hydrocephalus or herniation. Vascular: No hyperdense vessel or unexpected calcification. Skull: Stable chronic postsurgical changes related to left frontal craniotomy. Sinuses/Orbits: No acute finding. Other: None. IMPRESSION: 1. Stable left paramedian frontal lobe resection cavity and left frontal craniotomy postsurgical changes given differences in technique. 2. No acute intracranial abnormality identified. Electronically Signed   By: Kristine Garbe M.D.   On: 11/13/2017 17:47   Ct Abdomen Pelvis W Contrast  Result Date: 11/13/2017 CLINICAL DATA:  77 y/o M; fever and weakness with recent pneumonia. History of GBM. EXAM: CT ABDOMEN AND PELVIS WITH CONTRAST TECHNIQUE: Multidetector CT imaging of the abdomen and pelvis  was performed using the standard protocol following bolus administration of intravenous contrast. CONTRAST:  126mL ISOVUE-300 IOPAMIDOL (ISOVUE-300) INJECTION 61% COMPARISON:  09/25/2017 CT abdomen and pelvis. FINDINGS: Lower chest: No acute abnormality. Hepatobiliary: No focal liver abnormality is seen. No gallstones, gallbladder wall thickening, or biliary dilatation. Pancreas: Unremarkable. No pancreatic ductal dilatation or surrounding inflammatory changes. Spleen: Normal in size without focal abnormality. Adrenals/Urinary Tract: Adrenal glands are unremarkable. Kidneys are normal, without renal calculi, focal lesion, or hydronephrosis. Bladder is unremarkable. Stomach/Bowel: Stomach is within normal limits. Appendix appears normal. No evidence of bowel wall thickening, distention, or inflammatory changes. Vascular/Lymphatic: Aortic atherosclerosis. No enlarged abdominal or pelvic lymph nodes. Reproductive: Prostate is unremarkable. Other: No abdominal wall hernia or abnormality. No abdominopelvic ascites. Musculoskeletal: Stable advanced lumbar spine disc and facet degenerative changes, chronic pars defects of L3 and L5 bilaterally, and L3-4 grade 1 anterolisthesis. IMPRESSION: No acute process identified. Stable unremarkable CT of abdomen and pelvis. Electronically Signed   By: Kristine Garbe M.D.   On: 11/13/2017 17:42        Scheduled Meds: . dexamethasone  4 mg Oral BID  . enoxaparin (LOVENOX) injection  40 mg Subcutaneous Q24H  . feeding supplement (ENSURE ENLIVE)  237 mL Oral BID BM  . levETIRAcetam  500 mg Oral BID  . polyethylene glycol  17 g Oral Daily  . senna-docusate  1 tablet Oral BID   Continuous Infusions: . sodium chloride 100 mL/hr at 11/13/17 1809  . piperacillin-tazobactam (ZOSYN)  IV 3.375 g (11/14/17 1502)  . vancomycin Stopped (  11/14/17 1032)     LOS: 1 day    Time spent: 35 minutes    Irine Seal, MD [facility] Pager [phone]  If  7PM-7AM, please contact night-coverage [email or web address] Password [facility] 11/14/2017, 8:35 PM

## 2017-11-14 NOTE — Evaluation (Signed)
Physical Therapy Evaluation/Discharge Summary Patient Details Name: Christopher Morris MRN: [id number] DOB: 11-29-1940 Today's Date: 11/14/2017   History of Present Illness  Christopher Morris is a 77 y.o. male with recently diagnosed glioblastoma s/p craniotomy and radiation admitted on 11/13/2017 with fever, weakness, and falls.    Clinical Impression  Patient ambulated 400 feet pushing IV pole with min guard. Patient independent in bed mobility with supervision to min guard for transfer and ambulation. Patient would benefit from a [facility] for external stability point. Patient did exhibit mild balance deficits tandem balancing with left foot forward for 12 seconds and right foot forward for 7 seconds before loosing balance. Patient evaluated by Physical Therapy with no further acute PT needs identified. All education has been completed and the patient has no further questions. See below for any follow-up Physical Therapy or equipment needs. PT is signing off. Thank you for this referral.    Follow Up Recommendations Outpatient PT;Supervision - Intermittent    Equipment Recommendations  Cane(patient reports being 6'4".)    Recommendations for Other Services       Precautions / Restrictions Precautions Precautions: Fall Restrictions Weight Bearing Restrictions: No      Mobility  Bed Mobility Overal bed mobility: Needs Assistance Bed Mobility: Supine to Sit     Supine to sit: Supervision     General bed mobility comments: patient sitting in recliner at beginning and end of session.  Transfers Overall transfer level: Needs assistance Equipment used: None Transfers: Sit to/from [facility] Sit to Stand: Min guard Stand pivot transfers: Min guard       General transfer comment: wide BOS observed  Ambulation/Gait Ambulation/Gait assistance: Min guard Ambulation Distance (Feet): 400 Feet Assistive device: (pushed IV pole) Gait Pattern/deviations: Step-through  pattern;Decreased step length - right;Decreased step length - left;Decreased stride length     General Gait Details: pt with relatively slow but steady pace using IV pole, no LOB observed.  Stairs            Wheelchair Mobility    Modified Rankin (Stroke Patients Only)       Balance Overall balance assessment: Needs assistance Sitting-balance support: No upper extremity supported Sitting balance-Leahy Scale: Good     Standing balance support: Single extremity supported Standing balance-Leahy Scale: Fair                               Pertinent Vitals/Pain Pain Assessment: No/denies pain    Home Living Family/patient expects to be discharged to:: Private residence Living Arrangements: Children;Other relatives Available Help at Discharge: Family;Available PRN/intermittently Type of Home: Apartment Home Access: Level entry     Home Layout: One level Home Equipment: None Additional Comments: no family present during eval, pt with recent admission as above, pt reports today that daughter does not work and can be home 24/7    Prior Function Level of Independence: Independent   Gait / Transfers Assistance Needed: ambulatory without a device     Comments: recent short stay at SNF s/p craniotomy 09/2017     Hand Dominance        Extremity/Trunk Assessment   Upper Extremity Assessment Upper Extremity Assessment: Defer to OT evaluation    Lower Extremity Assessment Lower Extremity Assessment: Generalized weakness    Cervical / Trunk Assessment Cervical / Trunk Assessment: Normal  Communication   Communication: No difficulties  Cognition Arousal/Alertness: Awake/alert Behavior During Therapy: WFL for tasks assessed/performed Overall Cognitive Status: Within  Functional Limits for tasks assessed                                        General Comments      Exercises     Assessment/Plan    PT Assessment All further PT  needs can be met in the next venue of care  PT Problem List Decreased strength;Decreased mobility;Decreased activity tolerance       PT Treatment Interventions      PT Goals (Current goals can be found in the Care Plan section)  Acute Rehab PT Goals Patient Stated Goal: return home PT Goal Formulation: All assessment and education complete, DC therapy    Frequency     Barriers to discharge        Co-evaluation               AM-PAC PT "6 Clicks" Daily Activity  Outcome Measure Difficulty turning over in bed (including adjusting bedclothes, sheets and blankets)?: None Difficulty moving from lying on back to sitting on the side of the bed? : None Difficulty sitting down on and standing up from a chair with arms (e.g., wheelchair, bedside commode, etc,.)?: A Little Help needed moving to and from a bed to chair (including a wheelchair)?: A Little Help needed walking in hospital room?: A Little Help needed climbing 3-5 steps with a railing? : A Little 6 Click Score: 20    End of Session Equipment Utilized During Treatment: Gait belt Activity Tolerance: Patient tolerated treatment well Patient left: in chair;with call bell/phone within reach Nurse Communication: Mobility status PT Visit Diagnosis: Unsteadiness on feet (R26.81);Difficulty in walking, not elsewhere classified (R26.2)    Time: 1884-1660 PT Time Calculation (min) (ACUTE ONLY): 31 min   Charges:   PT Evaluation $PT Eval Moderate Complexity: 1 Mod PT Treatments $Gait Training: 8-22 mins   PT G Codes:        Ethelene Closser D. Hartnett-Rands, MS, PT Per [place] [phone] 11/14/2017, 11:55 AM

## 2017-11-14 NOTE — Progress Notes (Signed)
PHARMACY - PHYSICIAN COMMUNICATION CRITICAL VALUE ALERT - BLOOD CULTURE IDENTIFICATION (BCID)  Christopher Morris is an 77 y.o. male recently diagnosed glioblastoma s/p craniotomy and radiation admitted on 11/13/2017 with fever, weakness, and falls. Recently admitted 4/12-4/15 for seizures and noted to have E coli bacteremia. Vancomycin and zosyn started on admission for fever.  Name of physician (or Provider) Contacted: Dr. Grandville Silos  Current antibiotics: vancomycin and zosyn  Changes to prescribed antibiotics recommended:  - continue vancomycin and zosyn for now  ______________________________________________  Results for orders placed or performed during the hospital encounter of 11/13/17  Blood Culture ID Panel (Reflexed) (Collected: 11/13/2017 12:06 PM)  Result Value Ref Range   Enterococcus species NOT DETECTED NOT DETECTED   Listeria monocytogenes NOT DETECTED NOT DETECTED   Staphylococcus species DETECTED (A) NOT DETECTED   Staphylococcus aureus NOT DETECTED NOT DETECTED   Methicillin resistance DETECTED (A) NOT DETECTED   Streptococcus species NOT DETECTED NOT DETECTED   Streptococcus agalactiae NOT DETECTED NOT DETECTED   Streptococcus pneumoniae NOT DETECTED NOT DETECTED   Streptococcus pyogenes NOT DETECTED NOT DETECTED   Acinetobacter baumannii NOT DETECTED NOT DETECTED   Enterobacteriaceae species NOT DETECTED NOT DETECTED   Enterobacter cloacae complex NOT DETECTED NOT DETECTED   Escherichia coli NOT DETECTED NOT DETECTED   Klebsiella oxytoca NOT DETECTED NOT DETECTED   Klebsiella pneumoniae NOT DETECTED NOT DETECTED   Proteus species NOT DETECTED NOT DETECTED   Serratia marcescens NOT DETECTED NOT DETECTED   Haemophilus influenzae NOT DETECTED NOT DETECTED   Neisseria meningitidis NOT DETECTED NOT DETECTED   Pseudomonas aeruginosa NOT DETECTED NOT DETECTED   Candida albicans NOT DETECTED NOT DETECTED   Candida glabrata NOT DETECTED NOT DETECTED   Candida krusei NOT  DETECTED NOT DETECTED   Candida parapsilosis NOT DETECTED NOT DETECTED   Candida tropicalis NOT DETECTED NOT DETECTED    Lynelle Doctor 11/14/2017  11:38 AM

## 2017-11-15 ENCOUNTER — Ambulatory Visit
Admission: RE | Admit: 2017-11-15 | Discharge: 2017-11-15 | Disposition: A | Discharge status: Home / Self Care | Payer: BLUE CROSS/BLUE SHIELD | Source: Ambulatory Visit | Attending: Radiation Oncology | Admitting: Radiation Oncology

## 2017-11-15 LAB — URINE CULTURE: CULTURE: NO GROWTH

## 2017-11-15 LAB — CBC WITH DIFFERENTIAL/PLATELET
BASOS PCT: 0 %
Basophils Absolute: 0 10*3/uL (ref 0.0–0.1)
EOS PCT: 0 %
Eosinophils Absolute: 0 10*3/uL (ref 0.0–0.7)
HCT: 28.9 % — ABNORMAL LOW (ref 39.0–52.0)
HEMOGLOBIN: 9.6 g/dL — AB (ref 13.0–17.0)
Lymphocytes Relative: 19 %
Lymphs Abs: 1.6 10*3/uL (ref 0.7–4.0)
MCH: 31.3 pg (ref 26.0–34.0)
MCHC: 33.2 g/dL (ref 30.0–36.0)
MCV: 94.1 fL (ref 78.0–100.0)
Monocytes Absolute: 0.5 10*3/uL (ref 0.1–1.0)
Monocytes Relative: 6 %
NEUTROS PCT: 75 %
Neutro Abs: 6.3 10*3/uL (ref 1.7–7.7)
PLATELETS: 167 10*3/uL (ref 150–400)
RBC: 3.07 MIL/uL — ABNORMAL LOW (ref 4.22–5.81)
RDW: 12.9 % (ref 11.5–15.5)
WBC: 8.4 10*3/uL (ref 4.0–10.5)

## 2017-11-15 LAB — BASIC METABOLIC PANEL
Anion gap: 8 (ref 5–15)
BUN: 15 mg/dL (ref 6–20)
CHLORIDE: 105 mmol/L (ref 101–111)
CO2: 24 mmol/L (ref 22–32)
Calcium: 8.7 mg/dL — ABNORMAL LOW (ref 8.9–10.3)
Creatinine, Ser: 0.75 mg/dL (ref 0.61–1.24)
GFR calc Af Amer: >60 mL/min (ref >60)
GFR calc non Af Amer: >60 mL/min (ref >60)
GLUCOSE: 166 mg/dL — AB (ref 65–99)
Potassium: 4.4 mmol/L (ref 3.5–5.1)
Sodium: 137 mmol/L (ref 135–145)

## 2017-11-15 LAB — GLUCOSE, CAPILLARY
GLUCOSE-CAPILLARY: 281 mg/dL — AB (ref 65–99)
GLUCOSE-CAPILLARY: 224 mg/dL — AB (ref 65–99)
Glucose-Capillary: 188 mg/dL — ABNORMAL HIGH (ref 65–99)
Glucose-Capillary: 190 mg/dL — ABNORMAL HIGH (ref 65–99)

## 2017-11-15 LAB — MAGNESIUM: Magnesium: 1.9 mg/dL (ref 1.7–2.4)

## 2017-11-15 MED ORDER — INSULIN GLARGINE 100 UNIT/ML ~~LOC~~ SOLN
5.0000 [IU] | Freq: Every day | SUBCUTANEOUS | Status: DC
  Administered 2017-11-15 – 2017-11-16 (×2): 5 [IU] via SUBCUTANEOUS
  Filled 2017-11-15 (×2): qty 0.05

## 2017-11-15 MED ORDER — AMOXICILLIN-POT CLAVULANATE 875-125 MG PO TABS
1.0000 | ORAL_TABLET | Freq: Two times a day (BID) | ORAL | Status: DC
  Administered 2017-11-16: 1 via ORAL
  Filled 2017-11-15: qty 1

## 2017-11-15 MED ORDER — INSULIN ASPART 100 UNIT/ML ~~LOC~~ SOLN
0.0000 [IU] | Freq: Three times a day (TID) | SUBCUTANEOUS | Status: DC
  Administered 2017-11-15 (×2): 4 [IU] via SUBCUTANEOUS
  Administered 2017-11-16: 3 [IU] via SUBCUTANEOUS

## 2017-11-15 MED ORDER — INSULIN ASPART 100 UNIT/ML ~~LOC~~ SOLN
0.0000 [IU] | Freq: Every day | SUBCUTANEOUS | Status: DC
  Administered 2017-11-15: 2 [IU] via SUBCUTANEOUS

## 2017-11-15 NOTE — Progress Notes (Signed)
Occupational Therapy Treatment Patient Details Name: Christopher Morris MRN: [id number] DOB: 1941-04-25 Today's Date: 11/15/2017    History of present illness Christopher Morris is a 77 y.o. male with recently diagnosed glioblastoma s/p craniotomy and radiation admitted on 11/13/2017 with fever, weakness, and falls.   OT comments  Very pleasant and motivated.  Tends to furniture walk  Follow Up Recommendations  Home health OT;Supervision/Assistance - 24 hour    Equipment Recommendations  None recommended by OT    Recommendations for Other Services      Precautions / Restrictions Precautions Precautions: Fall Restrictions Weight Bearing Restrictions: No       Mobility Bed Mobility         Supine to sit: Supervision        Transfers       Sit to Stand: Min guard Stand pivot transfers: Min guard       General transfer comment: held onto IV pole and reached for furniture in room    Balance           Standing balance support: Single extremity supported Standing balance-Leahy Scale: Fair                             ADL either performed or assessed with clinical judgement   ADL       Grooming: Standing;Supervision/safety;Wash/dry hands;Oral care                   Toilet Transfer: Min guard;Ambulation;Comfort height toilet   Toileting- Clothing Manipulation and Hygiene: Min guard;Sit to/from stand         General ADL Comments: pt stood at sink for teeth, walked to bathroom then stood at that sink for washing hands     Vision       Perception     Praxis      Cognition Arousal/Alertness: Awake/alert Behavior During Therapy: WFL for tasks assessed/performed Overall Cognitive Status: No family/caregiver present to determine baseline cognitive functioning                                 General Comments: pt was unaware that bed was saturated with urine:  catheter had come off.  When in bathroom, tried to pick up  catheter bag that was hanging from grab bar:  forgot that it wasn't on        Exercises     Shoulder Instructions       General Comments      Pertinent Vitals/ Pain       Pain Assessment: No/denies pain  Home Living                                          Prior Functioning/Environment              Frequency           Progress Toward Goals  OT Goals(current goals can now be found in the care plan section)  Progress towards OT goals: Progressing toward goals     Plan      Co-evaluation                 AM-PAC PT "6 Clicks" Daily Activity     Outcome Measure   Help from another person eating meals?: None Help  from another person taking care of personal grooming?: A Little Help from another person toileting, which includes using toliet, bedpan, or urinal?: A Little Help from another person bathing (including washing, rinsing, drying)?: A Little Help from another person to put on and taking off regular upper body clothing?: A Little Help from another person to put on and taking off regular lower body clothing?: A Little 6 Click Score: 19    End of Session Equipment Utilized During Treatment: Gait belt  OT Visit Diagnosis: Unsteadiness on feet (R26.81);History of falling (Z91.81);Other abnormalities of gait and mobility (R26.89)   Activity Tolerance Patient tolerated treatment well   Patient Left in chair;with call bell/phone within reach   Nurse Communication          Time: 6468-0321 OT Time Calculation (min): 35 min  Charges: OT General Charges $OT Visit: 1 Visit OT Treatments $Self Care/Home Management : 23-37 mins  Lesle Chris, OTR/L [phone] 11/15/2017   Christopher Morris 11/15/2017, 3:32 PM

## 2017-11-15 NOTE — Progress Notes (Signed)
PROGRESS NOTE    Christopher Morris  ZYS:[id number] DOB: March 08, 1941 DOA: 11/13/2017 PCP: Patient, No Pcp Per   Brief Narrative: Patient is 77 year old gentleman recent diagnosis of glioblastoma status post craniotomy 09/30/2017 on radiation therapy recently hospitalized 11/09/2017-11/12/2017 for sepsis secondary to healthcare associated pneumonia presented back to the ED with fevers and falls.  Patient pancultured.  Patient placed empirically on IV vancomycin IV Zosyn.   Assessment & Plan:   Principal Problem:   Fever Active Problems:   Seizure (HCC)   Frontal glioblastoma multiforme (HCC)   Falls   Dehydration   Vitamin B12 deficiency   Anemia  #1 fevers/lactic acidosis Questionable etiology.  Patient recently admitted for fever and sepsis felt secondary to healthcare associated pneumonia.    Patient also noted during last hospitalization of April to 12 to April 15 of 2019 patient at that time admitted for an E. coli bacteremia felt secondary to E. coli UTI and initially treated with IV antibiotics and subsequently transitioned to oral ciprofloxacin on discharge.  Patient received 2 days of IV antibiotics and transition to oral antibiotics prior recent discharge of November 12, 2017.  Patient has been pancultured blood cultures growing coagulase-negative staph likely contaminant..  Urinalysis is unremarkable.  Lactic acid level improved.  CT abdomen and pelvis with no acute abnormalities noted.  Patient currently afebrile since admission.  Retinotomy site clean/dry/intact no signs or symptoms of infection, no drainage, nontender to palpation, no erythema.  Curb sided patient's neurosurgeon, Dr. Sherwood Gambler who reviewed CT scan of the head who felt there was significant improvement with edema and from review of scans felt it was highly unlikely that patient's fevers was secondary to probable infection noted and further imaging with MRI of the brain with and without contrast would likely be unremarkable for  any source of infection.  Will discontinue IV vancomycin IV Zosyn after today's dose and transition to oral Augmentin tomorrow for 5 more days to complete a one-week course of antibiotic treatment.  Supportive care.  2.  Falls Likely secondary to recently diagnosed frontal glioblastoma multiforme.  CT head with no acute abnormalities.  PT/OT.Follow.  3.  Dehydration Saline lock IV fluids.    4.  Recently diagnosed left frontal glioblastoma multiforme Status post craniotomy per Dr. Sherwood Gambler 09/30/2017.  Patient currently receiving daily radiation treatments.  Radiation oncology has been informed of patient's admission  and patient likely to undergo continued radiation treatments while in-house.  Continue current home regimen of Decadron and Keppra.  Patient's neuro oncologist has been informed via epic of patient's admission.   5.  Seizures Secondary to #4.  No seizures noted during his hospitalization.  Continue seizure prophylaxis with Keppra.   6 vitamin B12 deficiency Vitamin B12 levels less than 50.  Continue vitamin B12 IM injections daily x 6 days, and then weekly x1 month, and then monthly.  Will need outpatient follow-up.  7.  Anemia Questionable etiology.  Anemia panel consistent with anemia of chronic disease as well as a vitamin B12 deficiency.  See #6.  Patient with no overt bleeding.  Hemoglobin currently stable at 9.6.  Follow H&H.    DVT prophylaxis: Lovenox Code Status: Full Family Communication: Updated patient and daughter. Disposition Plan: Likely home with home health once clinically improved medically stable blood cultures finalized and afebrile for 48 hours on oral antibiotics.   Consultants:   Radiation oncology  Curbside neurosurgery: Dr. Sherwood Gambler  Procedures:   CT head 11/13/2017  CT abdomen and pelvis 11/13/2017  Chest x-ray 11/13/2017  Antimicrobials:   IV Zosyn 11/13/2017>>>>> 11/15/2017  IV vancomycin 11/13/2017>>>>> 11/15/2017  Augmentin  11/16/2017   Subjective: Patient in bed after radiation treatment.  No chest pain.  No shortness of breath.  No nausea vomiting.  Feels weakness is improving daily.  No falls noted.   Objective: Vitals:   11/14/17 0446 11/14/17 0655 11/14/17 1519 11/15/17 0429  BP: 118/77  125/72 118/77  Pulse: 81  74 61  Resp: 18  (!) 24 20  Temp: 98.7 F (37.1 C)  97.6 F (36.4 C) (!) 97.5 F (36.4 C)  TempSrc: Oral  Oral Oral  SpO2: 97%  95%   Weight:  84.6 kg (186 lb 8.2 oz)    Height:        Intake/Output Summary (Last 24 hours) at 11/15/2017 1511 Last data filed at 11/15/2017 1015 Gross per 24 hour  Intake 240 ml  Output 900 ml  Net -660 ml   Filed Weights   11/13/17 1116 11/14/17 0655  Weight: 84.4 kg (186 lb) 84.6 kg (186 lb 8.2 oz)    Examination:  General exam: Appears calm and comfortable. HEENT: [place]/AT. Craniotomy site c/d/i no signs and symptoms of infection.  Respiratory system: Clear to auscultation. Respiratory effort normal. Cardiovascular system: S1 & S2 heard, RRR. No JVD, murmurs, rubs, gallops or clicks. No pedal edema. Gastrointestinal system: Abdomen is nondistended, soft and nontender. No organomegaly or masses felt. Normal bowel sounds heard. Central nervous system: Alert and oriented. No focal neurological deficits. Extremities: Symmetric 5 x 5 power. Skin: No rashes, lesions or ulcers Psychiatry: Judgement and insight appear normal. Mood & affect appropriate.     Data Reviewed: I have personally reviewed following labs and imaging studies  CBC: Recent Labs  Lab 11/09/17 1207 11/10/17 1031 11/13/17 1205 11/14/17 0629 11/15/17 0626  WBC 6.6 6.5 7.2 6.3 8.4  NEUTROABS 5.6 5.2  --   --  6.3  HGB 10.7* 9.9* 10.6* 9.4* 9.6*  HCT 32.4* 30.3* 31.4* 28.6* 28.9*  MCV 96.1 97.1 94.3 95.0 94.1  PLT 137* 131* 168 159 650   Basic Metabolic Panel: Recent Labs  Lab 11/09/17 1207 11/10/17 1031 11/13/17 1205 11/14/17 0629 11/15/17 0626  NA 137 141 140 138 137   K 3.9 4.5 4.1 4.4 4.4  CL 101 105 103 104 105  CO2 22 26 26 24 24   GLUCOSE 178* 138* 128* 160* 166*  BUN 17 14 16 17 15   CREATININE 1.03 0.77 0.85 0.69 0.75  CALCIUM 8.6* 8.6* 8.8* 8.7* 8.7*  MG  --   --  1.9  --  1.9   GFR: Estimated Creatinine Clearance: 89.6 mL/min (by C-G formula based on SCr of 0.75 mg/dL). Liver Function Tests: Recent Labs  Lab 11/09/17 1207 11/13/17 1205  AST 28 32  ALT 43 65*  ALKPHOS 77 58  BILITOT 0.8 0.9  PROT 5.9* 6.4*  ALBUMIN 2.7* 2.5*   No results for input(s): LIPASE, AMYLASE in the last 168 hours. No results for input(s): AMMONIA in the last 168 hours. Coagulation Profile: No results for input(s): INR, PROTIME in the last 168 hours. Cardiac Enzymes: No results for input(s): CKTOTAL, CKMB, CKMBINDEX, TROPONINI in the last 168 hours. BNP (last 3 results) No results for input(s): PROBNP in the last 8760 hours. HbA1C: No results for input(s): HGBA1C in the last 72 hours. CBG: Recent Labs  Lab 11/14/17 0754 11/15/17 0929 11/15/17 1224  GLUCAP 150* 281* 188*   Lipid Profile: No results for input(s): CHOL, HDL, LDLCALC, TRIG,  CHOLHDL, LDLDIRECT in the last 72 hours. Thyroid Function Tests: No results for input(s): TSH, T4TOTAL, FREET4, T3FREE, THYROIDAB in the last 72 hours. Anemia Panel: Recent Labs    11/14/17 0629  VITAMINB12 <50*  FOLATE 16.3  FERRITIN 1,313*  TIBC 183*  IRON 41*   Sepsis Labs: Recent Labs  Lab 11/12/17 1026 11/12/17 1302 11/13/17 1213 11/14/17 0629  LATICACIDVEN 3.8* 1.9 3.02* 1.5    Recent Results (from the past 240 hour(s))  Blood Culture (routine x 2)     Status: None   Collection Time: 11/09/17 12:10 PM  Result Value Ref Range Status   Specimen Description   Final    BLOOD BLOOD RIGHT FOREARM Performed at [facility], [place] [address], [place], [place] [address]    Special Requests   Final    BOTTLES DRAWN AEROBIC AND ANAEROBIC Blood Culture results may not be optimal due  to an excessive volume of blood received in culture bottles Performed at [place] [facility] [address] [place], [place] [address]    Culture   Final    NO GROWTH 5 DAYS Performed at [facility], [place] [address], [place], [place] [address]    Report Status 11/14/2017 FINAL  Final  Blood Culture (routine x 2)     Status: None   Collection Time: 11/09/17 12:12 PM  Result Value Ref Range Status   Specimen Description   Final    BLOOD RIGHT ANTECUBITAL Performed at [place] [address] [place], [place] [address]    Special Requests   Final    BOTTLES DRAWN AEROBIC AND ANAEROBIC Blood Culture results may not be optimal due to an excessive volume of blood received in culture bottles Performed at [facility] [address], [place], [place] [address]    Culture   Final    NO GROWTH 5 DAYS Performed at [facility], [place] [address] [place], [place] [address]    Report Status 11/14/2017 FINAL  Final  Urine culture     Status: None   Collection Time: 11/09/17  3:21 PM  Result Value Ref Range Status   Specimen Description   Final    URINE, RANDOM Performed at [facility] [address] [place], [place] [address]    Special Requests   Final    NONE Performed at [facility], [place] [address], [place], [place] [address]    Culture   Final    NO GROWTH Performed at [facility], [place] [address], [place], [place] [address]    Report Status 11/10/2017 FINAL  Final  Respiratory Panel by PCR     Status: None   Collection Time: 11/09/17  5:45 PM  Result Value Ref Range Status   Adenovirus NOT DETECTED NOT DETECTED Final   Coronavirus 229E NOT DETECTED NOT DETECTED Final   Coronavirus HKU1 NOT DETECTED NOT DETECTED Final   Coronavirus NL63 NOT DETECTED NOT DETECTED Final   Coronavirus OC43 NOT DETECTED NOT DETECTED Final   Metapneumovirus NOT DETECTED NOT DETECTED  Final   Rhinovirus / Enterovirus NOT DETECTED NOT DETECTED Final   Influenza A NOT DETECTED NOT DETECTED Final   Influenza B NOT DETECTED NOT DETECTED Final   Parainfluenza Virus 1 NOT DETECTED NOT DETECTED Final   Parainfluenza Virus 2 NOT DETECTED NOT DETECTED Final   Parainfluenza Virus 3 NOT DETECTED NOT DETECTED Final   Parainfluenza Virus 4 NOT DETECTED  NOT DETECTED Final   Respiratory Syncytial Virus NOT DETECTED NOT DETECTED Final   Bordetella pertussis NOT DETECTED NOT DETECTED Final   Chlamydophila pneumoniae NOT DETECTED NOT DETECTED Final   Mycoplasma pneumoniae NOT DETECTED NOT DETECTED Final    Comment: Performed at [facility], [place] [address], [place], [place] [address]  Culture, sputum-assessment     Status: None   Collection Time: 11/10/17  9:51 PM  Result Value Ref Range Status   Specimen Description SPUTUM  Final   Special Requests NONE  Final   Sputum evaluation   Final    Sputum specimen not acceptable for testing.  Please recollect.   RESULT CALLED TO, READ BACK BY AND VERIFIED WITH: JOHN HOLDER AT 2340 ON 11/10/17 BY MOHAMED,A Performed at [facility], [place] [address], [place], [place] [address]    Report Status 11/10/2017 FINAL  Final  Blood culture (routine x 2)     Status: None (Preliminary result)   Collection Time: 11/13/17 12:06 PM  Result Value Ref Range Status   Specimen Description   Final    BLOOD LEFT ANTECUBITAL Performed at [place] [address], [place], [place] [address]    Special Requests   Final    BOTTLES DRAWN AEROBIC AND ANAEROBIC Blood Culture results may not be optimal due to an excessive volume of blood received in culture bottles Performed at [facility] [address], [place], [place] [address]    Culture  Setup Time   Final    GRAM POSITIVE COCCI IN BOTH AEROBIC AND ANAEROBIC BOTTLES CRITICAL RESULT CALLED TO, READ BACK BY AND VERIFIED WITH: Arvella Merles PharmD  9:50 11/14/17 ([email or web address]) Performed at [facility], [place] [address] [place], [place] [address]    Culture GRAM POSITIVE COCCI  Final   Report Status PENDING  Incomplete  Blood culture (routine x 2)     Status: None (Preliminary result)   Collection Time: 11/13/17 12:06 PM  Result Value Ref Range Status   Specimen Description   Final    BLOOD RIGHT ANTECUBITAL Performed at [facility] [address] [place], [place] [address]    Special Requests   Final    BOTTLES DRAWN AEROBIC AND ANAEROBIC Blood Culture results may not be optimal due to an excessive volume of blood received in culture bottles Performed at [facility] [address], [place], [place] [address]    Culture   Final    NO GROWTH 2 DAYS Performed at [facility] [address] [place], [place] [address]    Report Status PENDING  Incomplete  Blood Culture ID Panel (Reflexed)     Status: Abnormal   Collection Time: 11/13/17 12:06 PM  Result Value Ref Range Status   Enterococcus species NOT DETECTED NOT DETECTED Final   Listeria monocytogenes NOT DETECTED NOT DETECTED Final   Staphylococcus species DETECTED (A) NOT DETECTED Final    Comment: Methicillin (oxacillin) resistant coagulase negative staphylococcus. Possible blood culture contaminant (unless isolated from more than one blood culture draw or clinical case suggests pathogenicity). No antibiotic treatment is indicated for blood  culture contaminants. CRITICAL RESULT CALLED TO, READ BACK BY AND VERIFIED WITH: Arvella Merles PharmD 9:50 11/14/17 ([email or web address])    Staphylococcus aureus NOT DETECTED NOT DETECTED Final   Methicillin resistance DETECTED (A) NOT DETECTED Final    Comment: CRITICAL RESULT CALLED TO, READ BACK BY AND VERIFIED WITH: Arvella Merles PharmD 9:50  11/14/17 ([email or web address])    Streptococcus species NOT DETECTED NOT DETECTED Final   Streptococcus agalactiae NOT DETECTED NOT DETECTED Final   Streptococcus pneumoniae NOT  DETECTED NOT DETECTED Final   Streptococcus pyogenes NOT DETECTED NOT DETECTED Final   Acinetobacter baumannii NOT DETECTED NOT DETECTED Final   Enterobacteriaceae species NOT DETECTED NOT DETECTED Final   Enterobacter cloacae complex NOT DETECTED NOT DETECTED Final   Escherichia coli NOT DETECTED NOT DETECTED Final   Klebsiella oxytoca NOT DETECTED NOT DETECTED Final   Klebsiella pneumoniae NOT DETECTED NOT DETECTED Final   Proteus species NOT DETECTED NOT DETECTED Final   Serratia marcescens NOT DETECTED NOT DETECTED Final   Haemophilus influenzae NOT DETECTED NOT DETECTED Final   Neisseria meningitidis NOT DETECTED NOT DETECTED Final   Pseudomonas aeruginosa NOT DETECTED NOT DETECTED Final   Candida albicans NOT DETECTED NOT DETECTED Final   Candida glabrata NOT DETECTED NOT DETECTED Final   Candida krusei NOT DETECTED NOT DETECTED Final   Candida parapsilosis NOT DETECTED NOT DETECTED Final   Candida tropicalis NOT DETECTED NOT DETECTED Final    Comment: Performed at [facility], [place] [address], [place], [place] [address]  Urine culture     Status: None   Collection Time: 11/13/17  4:46 PM  Result Value Ref Range Status   Specimen Description   Final    URINE, RANDOM Performed at [place] [address] [place], [place] [address]    Special Requests   Final    NONE Performed at [facility], [place] [address], [place], [place] [address]    Culture   Final    NO GROWTH Performed at [facility], [place] [address], [place],  [address]    Report Status 11/15/2017 FINAL  Final         Radiology Studies: Ct Head Wo Contrast  Result Date: 11/13/2017 CLINICAL DATA:  77 y/o M; fever and weakness. History of glioblastoma. EXAM: CT HEAD WITHOUT CONTRAST TECHNIQUE: Contiguous axial images were obtained from the base of the skull through the vertex without intravenous contrast. COMPARISON:  10/25/2017 CT head.  10/02/2017  MRI head. FINDINGS: Brain: Left paramedian frontal lobe resection cavity, surrounding edema, and associated focal mass effect is stable. Associated mass effect no evidence for stroke, hemorrhage, or new focal mass effect in the brain. No hydrocephalus or herniation. Vascular: No hyperdense vessel or unexpected calcification. Skull: Stable chronic postsurgical changes related to left frontal craniotomy. Sinuses/Orbits: No acute finding. Other: None. IMPRESSION: 1. Stable left paramedian frontal lobe resection cavity and left frontal craniotomy postsurgical changes given differences in technique. 2. No acute intracranial abnormality identified. Electronically Signed   By: Kristine Garbe M.D.   On: 11/13/2017 17:47   Ct Abdomen Pelvis W Contrast  Result Date: 11/13/2017 CLINICAL DATA:  77 y/o M; fever and weakness with recent pneumonia. History of GBM. EXAM: CT ABDOMEN AND PELVIS WITH CONTRAST TECHNIQUE: Multidetector CT imaging of the abdomen and pelvis was performed using the standard protocol following bolus administration of intravenous contrast. CONTRAST:  14mL ISOVUE-300 IOPAMIDOL (ISOVUE-300) INJECTION 61% COMPARISON:  09/25/2017 CT abdomen and pelvis. FINDINGS: Lower chest: No acute abnormality. Hepatobiliary: No focal liver abnormality is seen. No gallstones, gallbladder wall thickening, or biliary dilatation. Pancreas: Unremarkable. No pancreatic ductal dilatation or surrounding inflammatory changes. Spleen: Normal in size without focal abnormality. Adrenals/Urinary Tract: Adrenal glands are unremarkable. Kidneys are normal, without renal calculi, focal lesion, or hydronephrosis. Bladder is unremarkable. Stomach/Bowel: Stomach is  within normal limits. Appendix appears normal. No evidence of bowel wall thickening, distention, or inflammatory changes. Vascular/Lymphatic: Aortic atherosclerosis. No enlarged abdominal or pelvic lymph nodes. Reproductive: Prostate is unremarkable. Other: No abdominal  wall hernia or abnormality. No abdominopelvic ascites. Musculoskeletal: Stable advanced lumbar spine disc and facet degenerative changes, chronic pars defects of L3 and L5 bilaterally, and L3-4 grade 1 anterolisthesis. IMPRESSION: No acute process identified. Stable unremarkable CT of abdomen and pelvis. Electronically Signed   By: Kristine Garbe M.D.   On: 11/13/2017 17:42        Scheduled Meds: . cyanocobalamin  1,000 mcg Intramuscular Daily  . dexamethasone  4 mg Oral BID  . enoxaparin (LOVENOX) injection  40 mg Subcutaneous Q24H  . feeding supplement (ENSURE ENLIVE)  237 mL Oral BID BM  . insulin aspart  0-20 Units Subcutaneous TID WC  . insulin aspart  0-5 Units Subcutaneous QHS  . insulin glargine  5 Units Subcutaneous Daily  . levETIRAcetam  500 mg Oral BID  . polyethylene glycol  17 g Oral Daily  . senna-docusate  1 tablet Oral BID   Continuous Infusions: . sodium chloride 75 mL/hr at 11/15/17 1247  . piperacillin-tazobactam (ZOSYN)  IV 3.375 g (11/15/17 1339)     LOS: 2 days    Time spent: 63 minutes    Irine Seal, MD [facility] Pager [phone]  If 7PM-7AM, please contact night-coverage [email or web address] Password [email or web address] 11/15/2017, 3:11 PM

## 2017-11-15 NOTE — Progress Notes (Signed)
[facility] Therapy Treatment Record Phone [phone]   Radiation Therapy was administered to Christopher Morris on: 11/15/2017  10:47 AM and was treatment # 14 out of a planned course of 23 treatments.  Radiation Treatment  1). Beam photons with 6-10 energy  2). Brachytherapy None  3). Stereotactic Radiosurgery None  4). Other Radiation None     Jacques Earthly, RT (T)

## 2017-11-15 NOTE — Care Management Note (Signed)
Case Management Note  Patient Details  Name: Emily Forse MRN: [id number] Date of Birth: 1940/12/14  Subjective/Objective:                  hhc- [facility]  Action/Plan: Will let rep know of home plans  Pt ot aide resumption of care  Expected Discharge Date:  (unknown)               Expected Discharge Plan:     In-House Referral:     Discharge planning Services     Post Acute Care Choice:    Choice offered to:     DME Arranged:    DME Agency:     HH Arranged:  RN, PT, OT, Nurse's Aide [place] Agency:  [facility] (now [facility])  Status of Service:  In process, will continue to follow  If discussed at Long Length of Stay Meetings, dates discussed:    Additional Comments:  Leeroy Cha, RN 11/15/2017, 11:47 AM

## 2017-11-16 DIAGNOSIS — A419 Sepsis, unspecified organism: Secondary | ICD-10-CM

## 2017-11-16 LAB — CBC
HEMATOCRIT: 28.9 % — AB (ref 39.0–52.0)
HEMOGLOBIN: 9.9 g/dL — AB (ref 13.0–17.0)
MCH: 32.5 pg (ref 26.0–34.0)
MCHC: 34.3 g/dL (ref 30.0–36.0)
MCV: 94.8 fL (ref 78.0–100.0)
Platelets: 213 10*3/uL (ref 150–400)
RBC: 3.05 MIL/uL — ABNORMAL LOW (ref 4.22–5.81)
RDW: 13.1 % (ref 11.5–15.5)
WBC: 12.8 10*3/uL — ABNORMAL HIGH (ref 4.0–10.5)

## 2017-11-16 LAB — GLUCOSE, CAPILLARY
Glucose-Capillary: 149 mg/dL — ABNORMAL HIGH (ref 65–99)
Glucose-Capillary: 197 mg/dL — ABNORMAL HIGH (ref 65–99)

## 2017-11-16 LAB — BASIC METABOLIC PANEL
ANION GAP: 11 (ref 5–15)
BUN: 19 mg/dL (ref 6–20)
CHLORIDE: 102 mmol/L (ref 101–111)
CO2: 26 mmol/L (ref 22–32)
Calcium: 9.2 mg/dL (ref 8.9–10.3)
Creatinine, Ser: 0.76 mg/dL (ref 0.61–1.24)
GFR calc Af Amer: >60 mL/min (ref >60)
GLUCOSE: 172 mg/dL — AB (ref 65–99)
POTASSIUM: 5.1 mmol/L (ref 3.5–5.1)
Sodium: 139 mmol/L (ref 135–145)

## 2017-11-16 LAB — CULTURE, BLOOD (ROUTINE X 2)

## 2017-11-16 MED ORDER — AMOXICILLIN-POT CLAVULANATE 875-125 MG PO TABS: 1.0000 | ORAL_TABLET | Freq: Two times a day (BID) | ORAL | 0 refills | Status: AC

## 2017-11-16 MED ORDER — "SYRINGE/NEEDLE (DISP) 21G X 1-1/2"" 5 ML MISC": 1000.0000 ug | Freq: Every day | 0 refills | Status: DC

## 2017-11-16 MED ORDER — POLYETHYLENE GLYCOL 3350 17 G PO PACK: 17.0000 g | PACK | Freq: Every day | ORAL | 0 refills | Status: DC

## 2017-11-16 MED ORDER — SENNOSIDES-DOCUSATE SODIUM 8.6-50 MG PO TABS: 1.0000 | ORAL_TABLET | Freq: Two times a day (BID) | ORAL | Status: DC

## 2017-11-16 MED ORDER — CYANOCOBALAMIN 1000 MCG/ML IJ SOLN: 1000.0000 ug | Freq: Every day | INTRAMUSCULAR | 0 refills | Status: DC

## 2017-11-16 NOTE — Discharge Summary (Signed)
Physician Discharge Summary  Christopher Morris RCV:[id number] DOB: [birth year] DOA: 11/13/2017  PCP: Patient, No Pcp Per  Admit date: 11/13/2017 Discharge date: 11/16/2017  Time spent: 65 minutes  Recommendations for Outpatient Follow-up:  1. Follow-up with PCP in 1 to 2 weeks.  On follow-up patient's vitamin B12 deficiency will need to be followed up upon.  Patient will need a CBC as well as to BMET. 2. Follow-up with Dr. Mickeal Morris by neuro oncologist as previously scheduled.   Discharge Diagnoses:  Principal Problem:   Fever Active Problems:   Seizure ([place])   Frontal glioblastoma multiforme (HCC)   Falls   Dehydration   Vitamin B12 deficiency   Anemia   Discharge Condition: Stable and improved  Diet recommendation: Regular  Filed Weights   11/13/17 1116 11/14/17 0655 11/16/17 0506  Weight: 84.4 kg (186 lb) 84.6 kg (186 lb 8.2 oz) 87.3 kg (192 lb 7.4 oz)    History of present illness:  Christopher Morris is a 77 y.o. male with medical history significant of recently diagnosed left frontal glioblastoma status post craniotomy 09/30/2017 per Dr. Sherwood Morris currently receiving radiation treatment, recently hospitalized 11/09/2017 to 11/12/2017 for sepsis felt secondary to healthcare associated pneumonia.  Patient presented back to the ED with fevers and a fall.  Per patient and family patient fell the night prior to admission while going to the bathroom and fell again this morning while using the bathroom.  Patient denied hitting his head.  Patient denied any chills, no nausea, no emesis, no chest pain, no shortness of breath, no cough, no abdominal pain, no diarrhea, no constipation, no dysuria, no melena, no hematemesis, no hematochezia.  Patient denied any focal neurological deficits.  Daughter however stated that patient's balance has been off recently causing him to tend to fall.  Patient also endorsed generalized weakness.  Patient was found on the floor by his grandson and EMS called.  Per family  when EMS got the patient had a temperature of 101.6.  Patient was brought to the ED.  ED Course: In the ED patient noted to have an elevated lactic acid level of 3.02.  Comprehensive metabolic profile done had a calcium of 8.8, albumin of 2.5, ALT of 65, protein of 6.4 otherwise was within normal limits.  CBC had a hemoglobin of 10.6 otherwise was within normal limits.  Urinalysis was nitrite negative leukocytes negative.  Blood cultures were ordered and pending.  Chest x-ray done showed resolution of previously seen interstitial changes with no focal acute abnormality noted.  Patient was given a dose of IV vancomycin IV Zosyn in the ED.  [facility] were called to admit the patient for further evaluation and management     Hospital Course:  1 fevers/lactic acidosis Questionable etiology. Patient recently admitted for fever and sepsis felt secondary to healthcare associated pneumonia.   Patient also noted during last hospitalization of April to 12 to April 15 of 2019 patient at that time admitted for an E. coli bacteremia felt secondary to E. coli UTI and initially treated with IV antibiotics and subsequently transitioned to oral ciprofloxacin on discharge.  Patient received 2 days of IV antibiotics and transition to oral antibiotics prior recent discharge of November 12, 2017.  Patient has been pancultured blood cultures growing coagulase-negative staph likely contaminant..  Urinalysis is unremarkable.  Lactic acid level improved.  CT abdomen and pelvis with no acute abnormalities noted.  Patient remained afebrile throughout the hospitalization.  Craniotomy site clean/dry/intact no signs or symptoms of infection, no drainage,  nontender to palpation, no erythema.  Curb sided patient's neurosurgeon, Dr. Sherwood Morris who reviewed CT scan of the head who felt there was significant improvement with edema and from review of scans felt it was highly unlikely that patient's fevers was secondary to probable  infection noted and further imaging with MRI of the brain with and without contrast would likely be unremarkable for any source of infection.  Patient was placed empirically on IV vancomycin IV Zosyn with clinical improvement.  Patient subsequently transitioned to oral Augmentin to be discharged home on 5 more days of oral Augmentin to complete a one-week course of antibiotic treatment.  Outpatient follow-up with PCP.  2. Falls Likely secondary to recently diagnosed frontal glioblastoma multiforme.  CT head with no acute abnormalities.  Patient was discharged home with home health therapies. PT/OT.Follow.  3. Dehydration Hydrated with IV fluids during the hospitalization was euvolemic by day of discharge.    4. Recently diagnosed left frontal glioblastoma multiforme Status post craniotomy per Dr. Sherwood Morris 09/30/2017. Patient received daily radiation treatments during the hospitalization. Radiation oncology was informed of patient's admission and patient underwent radiation treatments while in-house.  Patient was maintained on home regimen of Decadron and Keppra.  Outpatient follow-up.   5. Seizures Secondary to #4.    Patient was maintained on home regimen of Keppra throughout the hospitalization and had no seizure episodes.   6 vitamin B12 deficiency Vitamin B12 levels less than 50.   Patient was placed on vitamin B12 IM injections which will be discharged on.  Patient will be discharged on 5 more days of daily vitamin B12 IM injections, and then weekly x1 month, and then monthly.  Outpatient follow-up with PCP.  7.  Anemia Questionable etiology.  Anemia panel consistent with anemia of chronic disease as well as a vitamin B12 deficiency.  See #6.  Patient no bleeding during the hospitalization.  Hemoglobin remained stable.  Outpatient follow-up.       Procedures:  CT head 11/13/2017  CT abdomen and pelvis 11/13/2017  Chest x-ray  11/13/2017      Consultations:  None  Discharge Exam: Vitals:   11/15/17 2130 11/16/17 0506  BP: 132/85 (!) 142/82  Pulse: 72 65  Resp: 16 18  Temp: 98.5 F (36.9 C) 97.7 F (36.5 C)  SpO2: 95% 95%    General: NAD Cardiovascular: RRR Respiratory: CTAB  Discharge Instructions   Discharge Instructions    Diet general   Complete by:  As directed    Increase activity slowly   Complete by:  As directed      Allergies as of 11/16/2017      Reactions   Pork-derived Products    Patient does not eat pork   Shellfish Allergy    Patient does not eat shellfish      Medication List    TAKE these medications   acetaminophen 325 MG tablet Commonly known as:  TYLENOL Take 2 tablets (650 mg total) by mouth every 6 (six) hours as needed for mild pain (or Fever >/= 101).   amoxicillin-clavulanate 875-125 MG tablet Commonly known as:  AUGMENTIN Take 1 tablet by mouth every 12 (twelve) hours for 5 days.   cyanocobalamin 1000 MCG/ML injection Commonly known as:  (VITAMIN B-12) Inject 1 mL (1,000 mcg total) into the muscle daily. Take Vitamin B12 1044mcg IM daily x 5 days, then vitamin B12 1072mcg IM weekly, then vitamin B12 109mcg monthly. Start taking on:  11/17/2017   dexamethasone 4 MG tablet Commonly known as:  DECADRON Take 1 tablet (4 mg total) by mouth 2 (two) times daily.   feeding supplement (ENSURE ENLIVE) Liqd Take 237 mLs by mouth 2 (two) times daily between meals.   levETIRAcetam 500 MG tablet Commonly known as:  KEPPRA Take 1 tablet (500 mg total) by mouth 2 (two) times daily.   ondansetron 8 MG tablet Commonly known as:  ZOFRAN Take 1 tablet (8 mg total) by mouth 2 (two) times daily as needed. Start on the third day after chemotherapy.   polyethylene glycol packet Commonly known as:  MIRALAX / GLYCOLAX Take 17 g by mouth daily. Start taking on:  11/17/2017   senna-docusate 8.6-50 MG tablet Commonly known as:  Senokot-S Take 1 tablet by mouth 2 (two)  times daily.   SYRINGE-NEEDLE (DISP) 5 ML 21G X 1-1/2" 5 ML Misc Commonly known as:  SAFETY SYRINGES/NEEDLE 1,000 mcg by Does not apply route daily.      Allergies  Allergen Reactions  . Pork-Derived Products     Patient does not eat pork  . Shellfish Allergy     Patient does not eat shellfish   Follow-up Information    pcp. Schedule an appointment as soon as possible for a visit in 2 week(s).   Why:  f/u in 2 weeks.       Ventura Sellers, MD Follow up.   Specialties:  Psychiatry, Neurology, Oncology Why:  f/u as scheduled. Contact information: [place] [address] [phone]            The results of significant diagnostics from this hospitalization (including imaging, microbiology, ancillary and laboratory) are listed below for reference.    Significant Diagnostic Studies: Dg Chest 2 View  Result Date: 11/13/2017 CLINICAL DATA:  Fevers EXAM: CHEST - 2 VIEW COMPARISON:  11/11/2017 FINDINGS: Cardiac shadow is stable. The lungs are well aerated bilaterally without focal infiltrate. Improved aeration in the bases is noted bilaterally. No sizable effusion is seen. No bony abnormality is noted. IMPRESSION: Resolution of previously seen interstitial changes. No focal acute abnormality is noted. Electronically Signed   By: Inez Catalina M.D.   On: 11/13/2017 12:30   Dg Chest 2 View  Result Date: 11/11/2017 CLINICAL DATA:  Followup interstitial opacities.  Persistent fever. EXAM: CHEST - 2 VIEW COMPARISON:  PA and lateral chest x-ray of November 09, 2017 FINDINGS: The lungs are adequately inflated. The interstitial markings are coarse but stable. There is no pleural effusion. The heart and pulmonary vascularity are normal. The mediastinum is normal in width. There is calcification in the wall of the aortic arch. The trachea is midline. The bony thorax exhibits no acute abnormality. IMPRESSION: Persistent interstitial changes bilaterally little change since the  study of April 27th but much more conspicuous than on the study of October 25, 2017. This could reflect interstitial pneumonia or atypical pulmonary edema though I favor the former. There is no pleural effusion. Thoracic aortic atherosclerosis. Electronically Signed   By: David  Martinique M.D.   On: 11/11/2017 16:50   Dg Chest 2 View  Result Date: 11/09/2017 CLINICAL DATA:  Brain cancer.  Fever. EXAM: CHEST - 2 VIEW COMPARISON:  October 25, 2017 FINDINGS: Increased interstitial markings in the lungs. The heart size borderline. The hila and mediastinum are normal. No pulmonary nodules or masses. No focal infiltrates. IMPRESSION: Increasing interstitial opacities in the lungs could represent atypical infection or edema. Electronically Signed   By: Dorise Bullion III M.D   On: 11/09/2017 13:19  Dg Chest 2 View  Result Date: 10/25/2017 CLINICAL DATA:  Altered mental status EXAM: CHEST - 2 VIEW COMPARISON:  09/25/2017 FINDINGS: Cardiac shadow is within normal limits. The lungs are well aerated bilaterally. Very minimal left basilar atelectasis is seen. No bony abnormality is noted. IMPRESSION: Minimal left basilar atelectasis. Electronically Signed   By: Inez Catalina M.D.   On: 10/25/2017 06:51   Ct Head Wo Contrast  Result Date: 11/13/2017 CLINICAL DATA:  77 y/o M; fever and weakness. History of glioblastoma. EXAM: CT HEAD WITHOUT CONTRAST TECHNIQUE: Contiguous axial images were obtained from the base of the skull through the vertex without intravenous contrast. COMPARISON:  10/25/2017 CT head.  10/02/2017 MRI head. FINDINGS: Brain: Left paramedian frontal lobe resection cavity, surrounding edema, and associated focal mass effect is stable. Associated mass effect no evidence for stroke, hemorrhage, or new focal mass effect in the brain. No hydrocephalus or herniation. Vascular: No hyperdense vessel or unexpected calcification. Skull: Stable chronic postsurgical changes related to left frontal craniotomy.  Sinuses/Orbits: No acute finding. Other: None. IMPRESSION: 1. Stable left paramedian frontal lobe resection cavity and left frontal craniotomy postsurgical changes given differences in technique. 2. No acute intracranial abnormality identified. Electronically Signed   By: Kristine Garbe M.D.   On: 11/13/2017 17:47   Ct Head Wo Contrast  Result Date: 10/25/2017 CLINICAL DATA:  Status post glioblastoma resection with difficulty speaking EXAM: CT HEAD WITHOUT CONTRAST TECHNIQUE: Contiguous axial images were obtained from the base of the skull through the vertex without intravenous contrast. COMPARISON:  09/27/2017, 10/02/2017 FINDINGS: Brain: Postsurgical changes are noted in the left frontal lobe consistent with the recent glioblastoma resection. Some residual centrally necrotic neoplasm remains measuring approximately 2.3 x 2.3 cm. The overall appearance is stable when compared with the prior MRI. No findings to suggest acute hemorrhage or acute infarct are noted. Mild residual edematous changes are seen. No significant midline shift is noted. Vascular: No hyperdense vessel or unexpected calcification. Skull: Postsurgical changes in the left frontal region. Sinuses/Orbits: No acute finding. Other: None. IMPRESSION: Significant reduction in size in left frontal neoplasm when compared with the preoperative exam. The overall appearance is stable when compared with the recent MRI. No acute abnormality is noted. Electronically Signed   By: Inez Catalina M.D.   On: 10/25/2017 06:50   Ct Abdomen Pelvis W Contrast  Result Date: 11/13/2017 CLINICAL DATA:  77 y/o M; fever and weakness with recent pneumonia. History of GBM. EXAM: CT ABDOMEN AND PELVIS WITH CONTRAST TECHNIQUE: Multidetector CT imaging of the abdomen and pelvis was performed using the standard protocol following bolus administration of intravenous contrast. CONTRAST:  170mL ISOVUE-300 IOPAMIDOL (ISOVUE-300) INJECTION 61% COMPARISON:  09/25/2017 CT  abdomen and pelvis. FINDINGS: Lower chest: No acute abnormality. Hepatobiliary: No focal liver abnormality is seen. No gallstones, gallbladder wall thickening, or biliary dilatation. Pancreas: Unremarkable. No pancreatic ductal dilatation or surrounding inflammatory changes. Spleen: Normal in size without focal abnormality. Adrenals/Urinary Tract: Adrenal glands are unremarkable. Kidneys are normal, without renal calculi, focal lesion, or hydronephrosis. Bladder is unremarkable. Stomach/Bowel: Stomach is within normal limits. Appendix appears normal. No evidence of bowel wall thickening, distention, or inflammatory changes. Vascular/Lymphatic: Aortic atherosclerosis. No enlarged abdominal or pelvic lymph nodes. Reproductive: Prostate is unremarkable. Other: No abdominal wall hernia or abnormality. No abdominopelvic ascites. Musculoskeletal: Stable advanced lumbar spine disc and facet degenerative changes, chronic pars defects of L3 and L5 bilaterally, and L3-4 grade 1 anterolisthesis. IMPRESSION: No acute process identified. Stable unremarkable CT of abdomen and  pelvis. Electronically Signed   By: Kristine Garbe M.D.   On: 11/13/2017 17:42    Microbiology: Recent Results (from the past 240 hour(s))  Blood Culture (routine x 2)     Status: None   Collection Time: 11/09/17 12:10 PM  Result Value Ref Range Status   Specimen Description   Final    BLOOD BLOOD RIGHT FOREARM Performed at [facility] [address] [place], [place] [address]    Special Requests   Final    BOTTLES DRAWN AEROBIC AND ANAEROBIC Blood Culture results may not be optimal due to an excessive volume of blood received in culture bottles Performed at [facility] [address] [place], [place] [address]    Culture   Final    NO GROWTH 5 DAYS Performed at [facility], [place] [address], [place], [place] [address]    Report Status 11/14/2017 FINAL  Final  Blood Culture  (routine x 2)     Status: None   Collection Time: 11/09/17 12:12 PM  Result Value Ref Range Status   Specimen Description   Final    BLOOD RIGHT ANTECUBITAL Performed at [facility] [address] [place], [place] [address]    Special Requests   Final    BOTTLES DRAWN AEROBIC AND ANAEROBIC Blood Culture results may not be optimal due to an excessive volume of blood received in culture bottles Performed at [facility] [address], [place], [place] [address]    Culture   Final    NO GROWTH 5 DAYS Performed at [facility], [place] [address] [place], [place] [address]    Report Status 11/14/2017 FINAL  Final  Urine culture     Status: None   Collection Time: 11/09/17  3:21 PM  Result Value Ref Range Status   Specimen Description   Final    URINE, RANDOM Performed at [facility] [address], [place], [place] [address]    Special Requests   Final    NONE Performed at [facility], [place] [address], [place], [place] [address]    Culture   Final    NO GROWTH Performed at [facility], [place] [address] [place], [place] [address]    Report Status 11/10/2017 FINAL  Final  Respiratory Panel by PCR     Status: None   Collection Time: 11/09/17  5:45 PM  Result Value Ref Range Status   Adenovirus NOT DETECTED NOT DETECTED Final   Coronavirus 229E NOT DETECTED NOT DETECTED Final   Coronavirus HKU1 NOT DETECTED NOT DETECTED Final   Coronavirus NL63 NOT DETECTED NOT DETECTED Final   Coronavirus OC43 NOT DETECTED NOT DETECTED Final   Metapneumovirus NOT DETECTED NOT DETECTED Final   Rhinovirus / Enterovirus NOT DETECTED NOT DETECTED Final   Influenza A NOT DETECTED NOT DETECTED Final   Influenza B NOT DETECTED NOT DETECTED Final   Parainfluenza Virus 1 NOT DETECTED NOT DETECTED Final   Parainfluenza Virus 2 NOT DETECTED NOT DETECTED Final   Parainfluenza Virus 3 NOT DETECTED NOT DETECTED Final    Parainfluenza Virus 4 NOT DETECTED NOT DETECTED Final   Respiratory Syncytial Virus NOT DETECTED NOT DETECTED Final   Bordetella pertussis NOT DETECTED NOT DETECTED Final   Chlamydophila pneumoniae NOT DETECTED NOT DETECTED Final   Mycoplasma pneumoniae NOT DETECTED NOT DETECTED Final    Comment: Performed at [facility], [place] [address],  [place], [place] [address]  Culture, sputum-assessment     Status: None   Collection Time: 11/10/17  9:51 PM  Result Value Ref Range Status   Specimen Description SPUTUM  Final   Special Requests NONE  Final   Sputum evaluation   Final    Sputum specimen not acceptable for testing.  Please recollect.   RESULT CALLED TO, READ BACK BY AND VERIFIED WITH: JOHN HOLDER AT 2340 ON 11/10/17 BY MOHAMED,A Performed at [facility], [place] [address], [place], [place] [address]    Report Status 11/10/2017 FINAL  Final  Blood culture (routine x 2)     Status: Abnormal (Preliminary result)   Collection Time: 11/13/17 12:06 PM  Result Value Ref Range Status   Specimen Description   Final    BLOOD LEFT ANTECUBITAL Performed at [facility] [address], [place], [place] [address]    Special Requests   Final    BOTTLES DRAWN AEROBIC AND ANAEROBIC Blood Culture results may not be optimal due to an excessive volume of blood received in culture bottles Performed at [facility] [address], [place], [place] [address]    Culture  Setup Time   Final    GRAM POSITIVE COCCI IN BOTH AEROBIC AND ANAEROBIC BOTTLES CRITICAL RESULT CALLED TO, READ BACK BY AND VERIFIED WITH: Arvella Merles PharmD 9:50 11/14/17 ([email or web address]) Performed at [facility], [place] [address] [place], [place] [address]    Culture STAPHYLOCOCCUS SPECIES (COAGULASE NEGATIVE) (A)  Final   Report Status PENDING  Incomplete  Blood culture (routine x 2)     Status: None (Preliminary result)   Collection Time: 11/13/17 12:06 PM  Result Value Ref  Range Status   Specimen Description   Final    BLOOD RIGHT ANTECUBITAL Performed at [facility] [address], [place], [place] [address]    Special Requests   Final    BOTTLES DRAWN AEROBIC AND ANAEROBIC Blood Culture results may not be optimal due to an excessive volume of blood received in culture bottles Performed at [facility] [address] [place], [place] [address]    Culture   Final    NO GROWTH 2 DAYS Performed at [facility] [address] [place], [place] [address]    Report Status PENDING  Incomplete  Blood Culture ID Panel (Reflexed)     Status: Abnormal   Collection Time: 11/13/17 12:06 PM  Result Value Ref Range Status   Enterococcus species NOT DETECTED NOT DETECTED Final   Listeria monocytogenes NOT DETECTED NOT DETECTED Final   Staphylococcus species DETECTED (A) NOT DETECTED Final    Comment: Methicillin (oxacillin) resistant coagulase negative staphylococcus. Possible blood culture contaminant (unless isolated from more than one blood culture draw or clinical case suggests pathogenicity). No antibiotic treatment is indicated for blood  culture contaminants. CRITICAL RESULT CALLED TO, READ BACK BY AND VERIFIED WITH: Arvella Merles PharmD 9:50 11/14/17 ([email or web address])    Staphylococcus aureus NOT DETECTED NOT DETECTED Final   Methicillin resistance DETECTED (A) NOT DETECTED Final    Comment: CRITICAL RESULT CALLED TO, READ BACK BY AND VERIFIED WITH: Arvella Merles PharmD 9:50 11/14/17 ([email or web address])    Streptococcus species NOT DETECTED NOT DETECTED Final   Streptococcus agalactiae NOT DETECTED NOT DETECTED Final   Streptococcus pneumoniae NOT DETECTED NOT DETECTED Final   Streptococcus pyogenes NOT DETECTED NOT DETECTED Final   Acinetobacter baumannii NOT DETECTED NOT DETECTED Final   Enterobacteriaceae species  NOT DETECTED NOT DETECTED Final   Enterobacter cloacae complex NOT DETECTED NOT DETECTED Final   Escherichia coli NOT  DETECTED NOT DETECTED Final   Klebsiella oxytoca NOT DETECTED NOT DETECTED Final   Klebsiella pneumoniae NOT DETECTED NOT DETECTED Final   Proteus species NOT DETECTED NOT DETECTED Final   Serratia marcescens NOT DETECTED NOT DETECTED Final   Haemophilus influenzae NOT DETECTED NOT DETECTED Final   Neisseria meningitidis NOT DETECTED NOT DETECTED Final   Pseudomonas aeruginosa NOT DETECTED NOT DETECTED Final   Candida albicans NOT DETECTED NOT DETECTED Final   Candida glabrata NOT DETECTED NOT DETECTED Final   Candida krusei NOT DETECTED NOT DETECTED Final   Candida parapsilosis NOT DETECTED NOT DETECTED Final   Candida tropicalis NOT DETECTED NOT DETECTED Final    Comment: Performed at [facility], [place] [address] [place], [place] [address]  Urine culture     Status: None   Collection Time: 11/13/17  4:46 PM  Result Value Ref Range Status   Specimen Description   Final    URINE, RANDOM Performed at [facility] [address], [place], [place] [address]    Special Requests   Final    NONE Performed at [facility], [place] [address], [place], [place] [address]    Culture   Final    NO GROWTH Performed at [facility], [place] [address], [place], [place] [address]    Report Status 11/15/2017 FINAL  Final     Labs: Basic Metabolic Panel: Recent Labs  Lab 11/10/17 1031 11/13/17 1205 11/14/17 0629 11/15/17 0626 11/16/17 0550  NA 141 140 138 137 139  K 4.5 4.1 4.4 4.4 5.1  CL 105 103 104 105 102  CO2 26 26 24 24 26   GLUCOSE 138* 128* 160* 166* 172*  BUN 14 16 17 15 19   CREATININE 0.77 0.85 0.69 0.75 0.76  CALCIUM 8.6* 8.8* 8.7* 8.7* 9.2  MG  --  1.9  --  1.9  --    Liver Function Tests: Recent Labs  Lab 11/09/17 1207 11/13/17 1205  AST 28 32  ALT 43 65*  ALKPHOS 77 58  BILITOT 0.8 0.9  PROT 5.9* 6.4*  ALBUMIN 2.7* 2.5*   No results for input(s): LIPASE, AMYLASE in the last 168 hours. No results for input(s):  AMMONIA in the last 168 hours. CBC: Recent Labs  Lab 11/09/17 1207 11/10/17 1031 11/13/17 1205 11/14/17 0629 11/15/17 0626 11/16/17 0550  WBC 6.6 6.5 7.2 6.3 8.4 12.8*  NEUTROABS 5.6 5.2  --   --  6.3  --   HGB 10.7* 9.9* 10.6* 9.4* 9.6* 9.9*  HCT 32.4* 30.3* 31.4* 28.6* 28.9* 28.9*  MCV 96.1 97.1 94.3 95.0 94.1 94.8  PLT 137* 131* 168 159 167 213   Cardiac Enzymes: No results for input(s): CKTOTAL, CKMB, CKMBINDEX, TROPONINI in the last 168 hours. BNP: BNP (last 3 results) No results for input(s): BNP in the last 8760 hours.  ProBNP (last 3 results) No results for input(s): PROBNP in the last 8760 hours.  CBG: Recent Labs  Lab 11/15/17 0929 11/15/17 1224 11/15/17 1655 11/15/17 2204 11/16/17 0728  GLUCAP 281* 188* 190* 224* 149*       Signed:  Irine Seal MD.  [facility] 11/16/2017, 11:16 AM

## 2017-11-16 NOTE — Progress Notes (Signed)
Discharge instructions and medications discussed with patient and daughter.  Prescriptions and AVS given to patient.  All questions answered.

## 2017-11-18 ENCOUNTER — Ambulatory Visit
Admission: RE | Admit: 2017-11-18 | Discharge: 2017-11-18 | Disposition: A | Discharge status: Home / Self Care | Payer: BLUE CROSS/BLUE SHIELD | Source: Ambulatory Visit | Attending: Radiation Oncology | Admitting: Radiation Oncology

## 2017-11-18 DIAGNOSIS — C711 Malignant neoplasm of frontal lobe: Secondary | ICD-10-CM | POA: Diagnosis not present

## 2017-11-18 DIAGNOSIS — Z510 Encounter for antineoplastic radiation therapy: Secondary | ICD-10-CM | POA: Diagnosis not present

## 2017-11-18 LAB — CULTURE, BLOOD (ROUTINE X 2): Culture: NO GROWTH

## 2017-11-18 NOTE — Discharge Summary (Addendum)
Physician Discharge Summary  Christopher Morris SWN:[id number] DOB: January 14, 1941 DOA: 11/09/2017  PCP: Patient, No Pcp Per  Admit date: 11/09/2017 Discharge date: 11/12/2017  Admitted From: Home.  Disposition: Home.   Recommendations for Outpatient Follow-up:  1. Follow up with PCP in 1-2 weeks 2. Please obtain BMP/CBC in one week 3. Please follow up with oncology as recommended    Discharge Condition: stable.  CODE STATUS: full code.  Diet recommendation: Heart Healthy   Brief/Interim Summary: Christopher Morris a 77 y.o.malewith medical history significant ofglioblastoma on Decadron and history of seizures. Patient presented to the hospital complaining 1 day complaint of chills. Patient was found to have lactic acidosis and sepsis secondary to pneumonia chest x-ray findings.     Discharge Diagnoses:  Active Problems:   Seizure ([place])   Frontal glioblastoma multiforme ([facility])   Sepsis ([place])   Interstitial pneumonia ([place])   Sepsis from health care associated pneumonia from immunosuppresion:  - admitted for IV antibiotics. - resp panel neg.  Influenza neg.  Urine strep antigen negative.  Blood cultures have been negative so far.  Sputum unacceptable.  Repeat CXR to see if pneumonia is worsening. It appears to have changes of interstitial pneumonia.  Discharged on oral antibiotics to complete the course.    Lactic acidosis: Resolved.    Glioblastoma:  Continue with decadron.  Outpatient follow up with oncology. Radiation treatment in progress.    Seizures:  Resume keppra.       Discharge Instructions  Discharge Instructions    Diet - low sodium heart healthy   Complete by:  As directed    Discharge instructions   Complete by:  As directed    Please follow up with oncology as recommended.     Allergies as of 11/12/2017      Reactions   Pork-derived Products    Patient does not eat pork   Shellfish Allergy    Patient does not eat shellfish       Medication List    STOP taking these medications   ciprofloxacin 500 MG tablet Commonly known as:  CIPRO   temozolomide 140 MG capsule Commonly known as:  TEMODAR   temozolomide 20 MG capsule Commonly known as:  TEMODAR     TAKE these medications   acetaminophen 325 MG tablet Commonly known as:  TYLENOL Take 2 tablets (650 mg total) by mouth every 6 (six) hours as needed for mild pain (or Fever >/= 101).   dexamethasone 4 MG tablet Commonly known as:  DECADRON Take 1 tablet (4 mg total) by mouth 2 (two) times daily.   feeding supplement (ENSURE ENLIVE) Liqd Take 237 mLs by mouth 2 (two) times daily between meals.   levETIRAcetam 500 MG tablet Commonly known as:  KEPPRA Take 1 tablet (500 mg total) by mouth 2 (two) times daily.   ondansetron 8 MG tablet Commonly known as:  ZOFRAN Take 1 tablet (8 mg total) by mouth 2 (two) times daily as needed. Start on the third day after chemotherapy.       Allergies  Allergen Reactions  . Pork-Derived Products     Patient does not eat pork  . Shellfish Allergy     Patient does not eat shellfish    Consultations:  None.    Procedures/Studies: Dg Chest 2 View  Result Date: 11/13/2017 CLINICAL DATA:  Fevers EXAM: CHEST - 2 VIEW COMPARISON:  11/11/2017 FINDINGS: Cardiac shadow is stable. The lungs are well aerated bilaterally without focal infiltrate. Improved aeration in the  bases is noted bilaterally. No sizable effusion is seen. No bony abnormality is noted. IMPRESSION: Resolution of previously seen interstitial changes. No focal acute abnormality is noted. Electronically Signed   By: Inez Catalina M.D.   On: 11/13/2017 12:30   Dg Chest 2 View  Result Date: 11/11/2017 CLINICAL DATA:  Followup interstitial opacities.  Persistent fever. EXAM: CHEST - 2 VIEW COMPARISON:  PA and lateral chest x-ray of November 09, 2017 FINDINGS: The lungs are adequately inflated. The interstitial markings are coarse but stable. There is no pleural  effusion. The heart and pulmonary vascularity are normal. The mediastinum is normal in width. There is calcification in the wall of the aortic arch. The trachea is midline. The bony thorax exhibits no acute abnormality. IMPRESSION: Persistent interstitial changes bilaterally little change since the study of April 27th but much more conspicuous than on the study of October 25, 2017. This could reflect interstitial pneumonia or atypical pulmonary edema though I favor the former. There is no pleural effusion. Thoracic aortic atherosclerosis. Electronically Signed   By: David  Martinique M.D.   On: 11/11/2017 16:50   Dg Chest 2 View  Result Date: 11/09/2017 CLINICAL DATA:  Brain cancer.  Fever. EXAM: CHEST - 2 VIEW COMPARISON:  October 25, 2017 FINDINGS: Increased interstitial markings in the lungs. The heart size borderline. The hila and mediastinum are normal. No pulmonary nodules or masses. No focal infiltrates. IMPRESSION: Increasing interstitial opacities in the lungs could represent atypical infection or edema. Electronically Signed   By: Dorise Bullion III M.D   On: 11/09/2017 13:19   Dg Chest 2 View  Result Date: 10/25/2017 CLINICAL DATA:  Altered mental status EXAM: CHEST - 2 VIEW COMPARISON:  09/25/2017 FINDINGS: Cardiac shadow is within normal limits. The lungs are well aerated bilaterally. Very minimal left basilar atelectasis is seen. No bony abnormality is noted. IMPRESSION: Minimal left basilar atelectasis. Electronically Signed   By: Inez Catalina M.D.   On: 10/25/2017 06:51   Ct Head Wo Contrast  Result Date: 11/13/2017 CLINICAL DATA:  77 y/o M; fever and weakness. History of glioblastoma. EXAM: CT HEAD WITHOUT CONTRAST TECHNIQUE: Contiguous axial images were obtained from the base of the skull through the vertex without intravenous contrast. COMPARISON:  10/25/2017 CT head.  10/02/2017 MRI head. FINDINGS: Brain: Left paramedian frontal lobe resection cavity, surrounding edema, and associated focal  mass effect is stable. Associated mass effect no evidence for stroke, hemorrhage, or new focal mass effect in the brain. No hydrocephalus or herniation. Vascular: No hyperdense vessel or unexpected calcification. Skull: Stable chronic postsurgical changes related to left frontal craniotomy. Sinuses/Orbits: No acute finding. Other: None. IMPRESSION: 1. Stable left paramedian frontal lobe resection cavity and left frontal craniotomy postsurgical changes given differences in technique. 2. No acute intracranial abnormality identified. Electronically Signed   By: Kristine Garbe M.D.   On: 11/13/2017 17:47   Ct Head Wo Contrast  Result Date: 10/25/2017 CLINICAL DATA:  Status post glioblastoma resection with difficulty speaking EXAM: CT HEAD WITHOUT CONTRAST TECHNIQUE: Contiguous axial images were obtained from the base of the skull through the vertex without intravenous contrast. COMPARISON:  09/27/2017, 10/02/2017 FINDINGS: Brain: Postsurgical changes are noted in the left frontal lobe consistent with the recent glioblastoma resection. Some residual centrally necrotic neoplasm remains measuring approximately 2.3 x 2.3 cm. The overall appearance is stable when compared with the prior MRI. No findings to suggest acute hemorrhage or acute infarct are noted. Mild residual edematous changes are seen. No significant midline shift  is noted. Vascular: No hyperdense vessel or unexpected calcification. Skull: Postsurgical changes in the left frontal region. Sinuses/Orbits: No acute finding. Other: None. IMPRESSION: Significant reduction in size in left frontal neoplasm when compared with the preoperative exam. The overall appearance is stable when compared with the recent MRI. No acute abnormality is noted. Electronically Signed   By: Inez Catalina M.D.   On: 10/25/2017 06:50   Ct Abdomen Pelvis W Contrast  Result Date: 11/13/2017 CLINICAL DATA:  77 y/o M; fever and weakness with recent pneumonia. History of GBM.  EXAM: CT ABDOMEN AND PELVIS WITH CONTRAST TECHNIQUE: Multidetector CT imaging of the abdomen and pelvis was performed using the standard protocol following bolus administration of intravenous contrast. CONTRAST:  150mL ISOVUE-300 IOPAMIDOL (ISOVUE-300) INJECTION 61% COMPARISON:  09/25/2017 CT abdomen and pelvis. FINDINGS: Lower chest: No acute abnormality. Hepatobiliary: No focal liver abnormality is seen. No gallstones, gallbladder wall thickening, or biliary dilatation. Pancreas: Unremarkable. No pancreatic ductal dilatation or surrounding inflammatory changes. Spleen: Normal in size without focal abnormality. Adrenals/Urinary Tract: Adrenal glands are unremarkable. Kidneys are normal, without renal calculi, focal lesion, or hydronephrosis. Bladder is unremarkable. Stomach/Bowel: Stomach is within normal limits. Appendix appears normal. No evidence of bowel wall thickening, distention, or inflammatory changes. Vascular/Lymphatic: Aortic atherosclerosis. No enlarged abdominal or pelvic lymph nodes. Reproductive: Prostate is unremarkable. Other: No abdominal wall hernia or abnormality. No abdominopelvic ascites. Musculoskeletal: Stable advanced lumbar spine disc and facet degenerative changes, chronic pars defects of L3 and L5 bilaterally, and L3-4 grade 1 anterolisthesis. IMPRESSION: No acute process identified. Stable unremarkable CT of abdomen and pelvis. Electronically Signed   By: Kristine Garbe M.D.   On: 11/13/2017 17:42       Subjective: No new complaints.   Discharge Exam: Vitals:   11/11/17 2200 11/12/17 0512  BP: 130/79 127/69  Pulse: 73 80  Resp: (!) 22 20  Temp: 97.8 F (36.6 C) 100.2 F (37.9 C)  SpO2: 92% 92%   Vitals:   11/11/17 0501 11/11/17 1257 11/11/17 2200 11/12/17 0512  BP: (!) 150/78 134/76 130/79 127/69  Pulse: 80 88 73 80  Resp: 17 16 (!) 22 20  Temp: 100.3 F (37.9 C) 100.2 F (37.9 C) 97.8 F (36.6 C) 100.2 F (37.9 C)  TempSrc: Oral Oral Oral Oral   SpO2: 96% 96% 92% 92%  Weight:      Height:        General: Pt is alert, awake, not in acute distress Cardiovascular: RRR, S1/S2 +, no rubs, no gallops Respiratory: CTA bilaterally, no wheezing, no rhonchi Abdominal: Soft, NT, ND, bowel sounds + Extremities: no edema, no cyanosis    The results of significant diagnostics from this hospitalization (including imaging, microbiology, ancillary and laboratory) are listed below for reference.     Microbiology: Recent Results (from the past 240 hour(s))  Blood Culture (routine x 2)     Status: None   Collection Time: 11/09/17 12:10 PM  Result Value Ref Range Status   Specimen Description   Final    BLOOD BLOOD RIGHT FOREARM Performed at [facility] [address] [place], [place] [address]    Special Requests   Final    BOTTLES DRAWN AEROBIC AND ANAEROBIC Blood Culture results may not be optimal due to an excessive volume of blood received in culture bottles Performed at [facility] [address] [place], [place] [address]    Culture   Final    NO GROWTH 5 DAYS Performed at [facility], [place] [address] [place], [place] [address]    Report Status 11/14/2017 FINAL  Final  Blood Culture (routine x 2)     Status: None   Collection Time: 11/09/17 12:12 PM  Result Value Ref Range Status   Specimen Description   Final    BLOOD RIGHT ANTECUBITAL Performed at [facility] [address], [place], [place] [address]    Special Requests   Final    BOTTLES DRAWN AEROBIC AND ANAEROBIC Blood Culture results may not be optimal due to an excessive volume of blood received in culture bottles Performed at [facility] [address] [place], [place] [address]    Culture   Final    NO GROWTH 5 DAYS Performed at [facility], [place] [address], [place], [place] [address]    Report Status 11/14/2017 FINAL  Final  Urine culture     Status: None    Collection Time: 11/09/17  3:21 PM  Result Value Ref Range Status   Specimen Description   Final    URINE, RANDOM Performed at [facility] [address] [place], [place] [address]    Special Requests   Final    NONE Performed at [facility], [place] [address], [place], [place] [address]    Culture   Final    NO GROWTH Performed at [facility], [place] [address], [place], [place] [address]    Report Status 11/10/2017 FINAL  Final  Respiratory Panel by PCR     Status: None   Collection Time: 11/09/17  5:45 PM  Result Value Ref Range Status   Adenovirus NOT DETECTED NOT DETECTED Final   Coronavirus 229E NOT DETECTED NOT DETECTED Final   Coronavirus HKU1 NOT DETECTED NOT DETECTED Final   Coronavirus NL63 NOT DETECTED NOT DETECTED Final   Coronavirus OC43 NOT DETECTED NOT DETECTED Final   Metapneumovirus NOT DETECTED NOT DETECTED Final   Rhinovirus / Enterovirus NOT DETECTED NOT DETECTED Final   Influenza A NOT DETECTED NOT DETECTED Final   Influenza B NOT DETECTED NOT DETECTED Final   Parainfluenza Virus 1 NOT DETECTED NOT DETECTED Final   Parainfluenza Virus 2 NOT DETECTED NOT DETECTED Final   Parainfluenza Virus 3 NOT DETECTED NOT DETECTED Final   Parainfluenza Virus 4 NOT DETECTED NOT DETECTED Final   Respiratory Syncytial Virus NOT DETECTED NOT DETECTED Final   Bordetella pertussis NOT DETECTED NOT DETECTED Final   Chlamydophila pneumoniae NOT DETECTED NOT DETECTED Final   Mycoplasma pneumoniae NOT DETECTED NOT DETECTED Final    Comment: Performed at [facility], [place] [address], [place], [place] [address]  Culture, sputum-assessment     Status: None   Collection Time: 11/10/17  9:51 PM  Result Value Ref Range Status   Specimen Description SPUTUM  Final   Special Requests NONE  Final   Sputum evaluation   Final    Sputum specimen not acceptable for testing.  Please recollect.   RESULT CALLED TO, READ BACK BY AND VERIFIED  WITH: JOHN HOLDER AT 2340 ON 11/10/17 BY MOHAMED,A Performed at [facility], [place] [address] [place],  [address]    Report Status 11/10/2017 FINAL  Final  Blood culture (routine x 2)     Status: Abnormal   Collection Time: 11/13/17 12:06 PM  Result Value Ref Range Status   Specimen Description   Final    BLOOD LEFT ANTECUBITAL Performed at [facility], [place] [address] [place], [place] [address]    Special Requests   Final    BOTTLES DRAWN AEROBIC AND ANAEROBIC Blood Culture results may not be optimal due to an excessive volume of blood received in culture bottles Performed at [place] [address] [place], [place] [address]    Culture  Setup Time   Final    GRAM POSITIVE COCCI IN BOTH AEROBIC AND ANAEROBIC BOTTLES CRITICAL RESULT CALLED TO, READ BACK BY AND VERIFIED WITH: Arvella Merles PharmD 9:50 11/14/17 ([email or web address])    Culture (A)  Final    STAPHYLOCOCCUS SPECIES (COAGULASE NEGATIVE) THE SIGNIFICANCE OF ISOLATING THIS ORGANISM FROM A SINGLE SET OF BLOOD CULTURES WHEN MULTIPLE SETS ARE DRAWN IS UNCERTAIN. PLEASE NOTIFY THE MICROBIOLOGY DEPARTMENT WITHIN ONE WEEK IF SPECIATION AND SENSITIVITIES ARE REQUIRED. Performed at [facility], [place] [address] [place], [place] [address]    Report Status 11/16/2017 FINAL  Final  Blood culture (routine x 2)     Status: None (Preliminary result)   Collection Time: 11/13/17 12:06 PM  Result Value Ref Range Status   Specimen Description   Final    BLOOD RIGHT ANTECUBITAL Performed at [facility] [address], [place], [place] [address]    Special Requests   Final    BOTTLES DRAWN AEROBIC AND ANAEROBIC Blood Culture results may not be optimal due to an excessive volume of blood received in culture bottles Performed at [place] [address] [place], [place] [address]    Culture   Final    NO GROWTH 4 DAYS Performed at [facility], [place] [address], [place], [place] [address]    Report Status PENDING  Incomplete  Blood Culture ID Panel (Reflexed)     Status: Abnormal   Collection Time: 11/13/17 12:06 PM  Result Value Ref Range Status   Enterococcus species NOT DETECTED NOT DETECTED Final   Listeria monocytogenes NOT DETECTED NOT DETECTED Final   Staphylococcus species DETECTED (A) NOT DETECTED Final    Comment: Methicillin (oxacillin) resistant coagulase negative staphylococcus. Possible blood culture contaminant (unless isolated from more than one blood culture draw or clinical case suggests pathogenicity). No antibiotic treatment is indicated for blood  culture contaminants. CRITICAL RESULT CALLED TO, READ BACK BY AND VERIFIED WITH: Arvella Merles PharmD 9:50 11/14/17 ([email or web address])    Staphylococcus aureus NOT DETECTED NOT DETECTED Final   Methicillin resistance DETECTED (A) NOT DETECTED Final    Comment: CRITICAL RESULT CALLED TO, READ BACK BY AND VERIFIED WITH: Arvella Merles PharmD 9:50 11/14/17 ([email or web address])    Streptococcus species NOT DETECTED NOT DETECTED Final   Streptococcus agalactiae NOT DETECTED NOT DETECTED Final   Streptococcus pneumoniae NOT DETECTED NOT DETECTED Final   Streptococcus pyogenes NOT DETECTED NOT DETECTED Final   Acinetobacter baumannii NOT DETECTED NOT DETECTED Final   Enterobacteriaceae species NOT DETECTED NOT DETECTED Final   Enterobacter cloacae complex NOT DETECTED NOT DETECTED Final   Escherichia coli NOT DETECTED NOT DETECTED Final   Klebsiella oxytoca NOT DETECTED NOT DETECTED Final   Klebsiella pneumoniae NOT DETECTED NOT DETECTED Final   Proteus species NOT DETECTED NOT DETECTED Final   Serratia marcescens NOT DETECTED NOT DETECTED Final   Haemophilus influenzae NOT DETECTED NOT DETECTED Final   Neisseria meningitidis NOT DETECTED NOT DETECTED Final   Pseudomonas aeruginosa NOT DETECTED NOT DETECTED Final   Candida albicans NOT DETECTED NOT DETECTED Final   Candida glabrata NOT  DETECTED NOT DETECTED  Final   Candida krusei NOT DETECTED NOT DETECTED Final   Candida parapsilosis NOT DETECTED NOT DETECTED Final   Candida tropicalis NOT DETECTED NOT DETECTED Final    Comment: Performed at [facility], [place] [address], [place], [place] [address]  Urine culture     Status: None   Collection Time: 11/13/17  4:46 PM  Result Value Ref Range Status   Specimen Description   Final    URINE, RANDOM Performed at [facility] [address], [place], [place] [address]    Special Requests   Final    NONE Performed at [facility], [place] [address], [place], [place] [address]    Culture   Final    NO GROWTH Performed at [facility], [place] [address] [place], [place] [address]    Report Status 11/15/2017 FINAL  Final     Labs: BNP (last 3 results) No results for input(s): BNP in the last 8760 hours. Basic Metabolic Panel: Recent Labs  Lab 11/13/17 1205 11/14/17 0629 11/15/17 0626 11/16/17 0550  NA 140 138 137 139  K 4.1 4.4 4.4 5.1  CL 103 104 105 102  CO2 26 24 24 26   GLUCOSE 128* 160* 166* 172*  BUN 16 17 15 19   CREATININE 0.85 0.69 0.75 0.76  CALCIUM 8.8* 8.7* 8.7* 9.2  MG 1.9  --  1.9  --    Liver Function Tests: Recent Labs  Lab 11/13/17 1205  AST 32  ALT 65*  ALKPHOS 58  BILITOT 0.9  PROT 6.4*  ALBUMIN 2.5*   No results for input(s): LIPASE, AMYLASE in the last 168 hours. No results for input(s): AMMONIA in the last 168 hours. CBC: Recent Labs  Lab 11/13/17 1205 11/14/17 0629 11/15/17 0626 11/16/17 0550  WBC 7.2 6.3 8.4 12.8*  NEUTROABS  --   --  6.3  --   HGB 10.6* 9.4* 9.6* 9.9*  HCT 31.4* 28.6* 28.9* 28.9*  MCV 94.3 95.0 94.1 94.8  PLT 168 159 167 213   Cardiac Enzymes: No results for input(s): CKTOTAL, CKMB, CKMBINDEX, TROPONINI in the last 168 hours. BNP: Invalid input(s): POCBNP CBG: Recent Labs  Lab 11/15/17 1224 11/15/17 1655 11/15/17 2204 11/16/17 0728 11/16/17 1116   GLUCAP 188* 190* 224* 149* 197*   D-Dimer No results for input(s): DDIMER in the last 72 hours. Hgb A1c No results for input(s): HGBA1C in the last 72 hours. Lipid Profile No results for input(s): CHOL, HDL, LDLCALC, TRIG, CHOLHDL, LDLDIRECT in the last 72 hours. Thyroid function studies No results for input(s): TSH, T4TOTAL, T3FREE, THYROIDAB in the last 72 hours.  Invalid input(s): FREET3 Anemia work up No results for input(s): VITAMINB12, FOLATE, FERRITIN, TIBC, IRON, RETICCTPCT in the last 72 hours. Urinalysis    Component Value Date/Time   COLORURINE YELLOW 11/13/2017 1646   APPEARANCEUR CLEAR 11/13/2017 1646   LABSPEC 1.021 11/13/2017 1646   PHURINE 6.0 11/13/2017 1646   GLUCOSEU NEGATIVE 11/13/2017 1646   HGBUR NEGATIVE 11/13/2017 1646   BILIRUBINUR NEGATIVE 11/13/2017 1646   KETONESUR NEGATIVE 11/13/2017 1646   PROTEINUR NEGATIVE 11/13/2017 1646   NITRITE NEGATIVE 11/13/2017 1646   LEUKOCYTESUR NEGATIVE 11/13/2017 1646   Sepsis Labs Invalid input(s): PROCALCITONIN,  WBC,  LACTICIDVEN Microbiology Recent Results (from the past 240 hour(s))  Blood Culture (routine x 2)     Status: None   Collection Time: 11/09/17 12:10 PM  Result Value Ref Range Status   Specimen Description   Final  BLOOD BLOOD RIGHT FOREARM Performed at [place] [address] [place], [place] [address]    Special Requests   Final    BOTTLES DRAWN AEROBIC AND ANAEROBIC Blood Culture results may not be optimal due to an excessive volume of blood received in culture bottles Performed at [place] [address], [place], [place] [address]    Culture   Final    NO GROWTH 5 DAYS Performed at [facility], [place] [address] [place], [place] [address]    Report Status 11/14/2017 FINAL  Final  Blood Culture (routine x 2)     Status: None   Collection Time: 11/09/17 12:12 PM  Result Value Ref Range Status   Specimen Description   Final     BLOOD RIGHT ANTECUBITAL Performed at [place] [address], [place], [place] [address]    Special Requests   Final    BOTTLES DRAWN AEROBIC AND ANAEROBIC Blood Culture results may not be optimal due to an excessive volume of blood received in culture bottles Performed at [place] [address] [place], [place] [address]    Culture   Final    NO GROWTH 5 DAYS Performed at [facility], [place] [address], [place], [place] [address]    Report Status 11/14/2017 FINAL  Final  Urine culture     Status: None   Collection Time: 11/09/17  3:21 PM  Result Value Ref Range Status   Specimen Description   Final    URINE, RANDOM Performed at [place] [address] [place], [place] [address]    Special Requests   Final    NONE Performed at [facility], [place] [address], [place], [place] [address]    Culture   Final    NO GROWTH Performed at [facility], [place] [address], [place], [place] [address]    Report Status 11/10/2017 FINAL  Final  Respiratory Panel by PCR     Status: None   Collection Time: 11/09/17  5:45 PM  Result Value Ref Range Status   Adenovirus NOT DETECTED NOT DETECTED Final   Coronavirus 229E NOT DETECTED NOT DETECTED Final   Coronavirus HKU1 NOT DETECTED NOT DETECTED Final   Coronavirus NL63 NOT DETECTED NOT DETECTED Final   Coronavirus OC43 NOT DETECTED NOT DETECTED Final   Metapneumovirus NOT DETECTED NOT DETECTED Final   Rhinovirus / Enterovirus NOT DETECTED NOT DETECTED Final   Influenza A NOT DETECTED NOT DETECTED Final   Influenza B NOT DETECTED NOT DETECTED Final   Parainfluenza Virus 1 NOT DETECTED NOT DETECTED Final   Parainfluenza Virus 2 NOT DETECTED NOT DETECTED Final   Parainfluenza Virus 3 NOT DETECTED NOT DETECTED Final   Parainfluenza Virus 4 NOT DETECTED NOT DETECTED Final   Respiratory Syncytial Virus NOT DETECTED NOT DETECTED Final   Bordetella  pertussis NOT DETECTED NOT DETECTED Final   Chlamydophila pneumoniae NOT DETECTED NOT DETECTED Final   Mycoplasma pneumoniae NOT DETECTED NOT DETECTED Final    Comment: Performed at [facility], [place] [address], [place], [place] [address]  Culture, sputum-assessment     Status: None   Collection Time: 11/10/17  9:51 PM  Result Value Ref Range Status   Specimen Description SPUTUM  Final   Special Requests NONE  Final   Sputum evaluation   Final    Sputum specimen not acceptable for testing.  Please recollect.  RESULT CALLED TO, READ BACK BY AND VERIFIED WITH: JOHN HOLDER AT 2340 ON 11/10/17 BY MOHAMED,A Performed at [facility], [place] [address], [place], [place] [address]    Report Status 11/10/2017 FINAL  Final  Blood culture (routine x 2)     Status: Abnormal   Collection Time: 11/13/17 12:06 PM  Result Value Ref Range Status   Specimen Description   Final    BLOOD LEFT ANTECUBITAL Performed at [facility] [address], [place], [place] [address]    Special Requests   Final    BOTTLES DRAWN AEROBIC AND ANAEROBIC Blood Culture results may not be optimal due to an excessive volume of blood received in culture bottles Performed at [facility] [address], [place], [place] [address]    Culture  Setup Time   Final    GRAM POSITIVE COCCI IN BOTH AEROBIC AND ANAEROBIC BOTTLES CRITICAL RESULT CALLED TO, READ BACK BY AND VERIFIED WITH: Arvella Merles PharmD 9:50 11/14/17 ([email or web address])    Culture (A)  Final    STAPHYLOCOCCUS SPECIES (COAGULASE NEGATIVE) THE SIGNIFICANCE OF ISOLATING THIS ORGANISM FROM A SINGLE SET OF BLOOD CULTURES WHEN MULTIPLE SETS ARE DRAWN IS UNCERTAIN. PLEASE NOTIFY THE MICROBIOLOGY DEPARTMENT WITHIN ONE WEEK IF SPECIATION AND SENSITIVITIES ARE REQUIRED. Performed at [facility], [place] [address], [place], [place] [address]    Report Status 11/16/2017 FINAL  Final  Blood culture (routine x 2)     Status:  None (Preliminary result)   Collection Time: 11/13/17 12:06 PM  Result Value Ref Range Status   Specimen Description   Final    BLOOD RIGHT ANTECUBITAL Performed at [facility] [address] [place], [place] [address]    Special Requests   Final    BOTTLES DRAWN AEROBIC AND ANAEROBIC Blood Culture results may not be optimal due to an excessive volume of blood received in culture bottles Performed at [facility] [address] [place], [place] [address]    Culture   Final    NO GROWTH 4 DAYS Performed at [facility], [place] [address] [place],  [address]    Report Status PENDING  Incomplete  Blood Culture ID Panel (Reflexed)     Status: Abnormal   Collection Time: 11/13/17 12:06 PM  Result Value Ref Range Status   Enterococcus species NOT DETECTED NOT DETECTED Final   Listeria monocytogenes NOT DETECTED NOT DETECTED Final   Staphylococcus species DETECTED (A) NOT DETECTED Final    Comment: Methicillin (oxacillin) resistant coagulase negative staphylococcus. Possible blood culture contaminant (unless isolated from more than one blood culture draw or clinical case suggests pathogenicity). No antibiotic treatment is indicated for blood  culture contaminants. CRITICAL RESULT CALLED TO, READ BACK BY AND VERIFIED WITH: Arvella Merles PharmD 9:50 11/14/17 ([email or web address])    Staphylococcus aureus NOT DETECTED NOT DETECTED Final   Methicillin resistance DETECTED (A) NOT DETECTED Final    Comment: CRITICAL RESULT CALLED TO, READ BACK BY AND VERIFIED WITH: Arvella Merles PharmD 9:50 11/14/17 ([email or web address])    Streptococcus species NOT DETECTED NOT DETECTED Final   Streptococcus agalactiae NOT DETECTED NOT DETECTED Final   Streptococcus pneumoniae NOT DETECTED NOT DETECTED Final   Streptococcus pyogenes NOT DETECTED NOT DETECTED Final   Acinetobacter baumannii NOT DETECTED NOT DETECTED Final   Enterobacteriaceae species NOT DETECTED NOT DETECTED Final    Enterobacter cloacae complex NOT DETECTED NOT DETECTED Final   Escherichia coli NOT DETECTED NOT DETECTED  Final   Klebsiella oxytoca NOT DETECTED NOT DETECTED Final   Klebsiella pneumoniae NOT DETECTED NOT DETECTED Final   Proteus species NOT DETECTED NOT DETECTED Final   Serratia marcescens NOT DETECTED NOT DETECTED Final   Haemophilus influenzae NOT DETECTED NOT DETECTED Final   Neisseria meningitidis NOT DETECTED NOT DETECTED Final   Pseudomonas aeruginosa NOT DETECTED NOT DETECTED Final   Candida albicans NOT DETECTED NOT DETECTED Final   Candida glabrata NOT DETECTED NOT DETECTED Final   Candida krusei NOT DETECTED NOT DETECTED Final   Candida parapsilosis NOT DETECTED NOT DETECTED Final   Candida tropicalis NOT DETECTED NOT DETECTED Final    Comment: Performed at [facility], [place] [address], [place], [place] [address]  Urine culture     Status: None   Collection Time: 11/13/17  4:46 PM  Result Value Ref Range Status   Specimen Description   Final    URINE, RANDOM Performed at [facility] [address] [place], [place] [address]    Special Requests   Final    NONE Performed at [facility], [place] [address] [place], [place] [address]    Culture   Final    NO GROWTH Performed at [facility], [place] [address], [place], [place] [address]    Report Status 11/15/2017 FINAL  Final     Time coordinating discharge: 33 minutes  SIGNED:   Hosie Poisson, MD  [facility] 11/18/2017, 12:34 AM Pager   If 7PM-7AM, please contact night-coverage [email or web address] Password [email or web address]

## 2017-11-19 ENCOUNTER — Ambulatory Visit
Admission: RE | Admit: 2017-11-19 | Discharge: 2017-11-19 | Disposition: A | Discharge status: Home / Self Care | Payer: BLUE CROSS/BLUE SHIELD | Source: Ambulatory Visit | Attending: Radiation Oncology | Admitting: Radiation Oncology

## 2017-11-19 DIAGNOSIS — C711 Malignant neoplasm of frontal lobe: Secondary | ICD-10-CM | POA: Diagnosis not present

## 2017-11-20 ENCOUNTER — Ambulatory Visit
Admission: RE | Admit: 2017-11-20 | Discharge: 2017-11-20 | Disposition: A | Discharge status: Home / Self Care | Payer: BLUE CROSS/BLUE SHIELD | Source: Ambulatory Visit | Attending: Radiation Oncology | Admitting: Radiation Oncology

## 2017-11-20 DIAGNOSIS — J189 Pneumonia, unspecified organism: Secondary | ICD-10-CM | POA: Diagnosis not present

## 2017-11-20 DIAGNOSIS — R569 Unspecified convulsions: Secondary | ICD-10-CM | POA: Diagnosis not present

## 2017-11-20 DIAGNOSIS — A4151 Sepsis due to Escherichia coli [E. coli]: Secondary | ICD-10-CM | POA: Diagnosis not present

## 2017-11-20 DIAGNOSIS — N390 Urinary tract infection, site not specified: Secondary | ICD-10-CM | POA: Diagnosis not present

## 2017-11-20 DIAGNOSIS — D630 Anemia in neoplastic disease: Secondary | ICD-10-CM | POA: Diagnosis not present

## 2017-11-20 DIAGNOSIS — C711 Malignant neoplasm of frontal lobe: Secondary | ICD-10-CM | POA: Diagnosis not present

## 2017-11-21 ENCOUNTER — Ambulatory Visit
Admission: RE | Admit: 2017-11-21 | Discharge: 2017-11-21 | Disposition: A | Discharge status: Home / Self Care | Payer: BLUE CROSS/BLUE SHIELD | Source: Ambulatory Visit | Attending: Radiation Oncology | Admitting: Radiation Oncology

## 2017-11-21 ENCOUNTER — Inpatient Hospital Stay: Payer: BLUE CROSS/BLUE SHIELD | Attending: Internal Medicine

## 2017-11-21 ENCOUNTER — Telehealth: Payer: Self-pay | Admitting: Internal Medicine

## 2017-11-21 ENCOUNTER — Encounter (HOSPITAL_COMMUNITY): Payer: Self-pay | Admitting: Internal Medicine

## 2017-11-21 ENCOUNTER — Inpatient Hospital Stay (HOSPITAL_BASED_OUTPATIENT_CLINIC_OR_DEPARTMENT_OTHER): Payer: BLUE CROSS/BLUE SHIELD | Admitting: Internal Medicine

## 2017-11-21 ENCOUNTER — Encounter: Payer: Self-pay | Admitting: Internal Medicine

## 2017-11-21 ENCOUNTER — Other Ambulatory Visit: Payer: Self-pay

## 2017-11-21 VITALS — BP 127/78 | HR 81 | Temp 97.7°F | Resp 18 | Ht 76.0 in | Wt 182.3 lb

## 2017-11-21 DIAGNOSIS — C711 Malignant neoplasm of frontal lobe: Secondary | ICD-10-CM | POA: Diagnosis not present

## 2017-11-21 DIAGNOSIS — Z9181 History of falling: Secondary | ICD-10-CM | POA: Insufficient documentation

## 2017-11-21 DIAGNOSIS — C719 Malignant neoplasm of brain, unspecified: Secondary | ICD-10-CM

## 2017-11-21 DIAGNOSIS — R5383 Other fatigue: Secondary | ICD-10-CM | POA: Diagnosis not present

## 2017-11-21 LAB — CBC WITH DIFFERENTIAL (CANCER CENTER ONLY)
BASOS ABS: 0 10*3/uL (ref 0.0–0.1)
Basophils Relative: 0 %
EOS PCT: 0 %
Eosinophils Absolute: 0 10*3/uL (ref 0.0–0.5)
HEMATOCRIT: 34.1 % — AB (ref 38.4–49.9)
Hemoglobin: 11.2 g/dL — ABNORMAL LOW (ref 13.0–17.1)
LYMPHS PCT: 39 %
Lymphs Abs: 3.7 10*3/uL — ABNORMAL HIGH (ref 0.9–3.3)
MCH: 31.9 pg (ref 27.2–33.4)
MCHC: 32.8 g/dL (ref 32.0–36.0)
MCV: 97.2 fL (ref 79.3–98.0)
Monocytes Absolute: 0.7 10*3/uL (ref 0.1–0.9)
Monocytes Relative: 7 %
NEUTROS ABS: 5 10*3/uL (ref 1.5–6.5)
NEUTROS PCT: 54 %
PLATELETS: 201 10*3/uL (ref 140–400)
RBC: 3.51 MIL/uL — AB (ref 4.20–5.82)
RDW: 13.8 % (ref 11.0–14.6)
WBC: 9.4 10*3/uL (ref 4.0–10.3)

## 2017-11-21 LAB — CMP (CANCER CENTER ONLY)
ALBUMIN: 3.2 g/dL — AB (ref 3.5–5.0)
ALK PHOS: 70 U/L (ref 40–150)
ALT: 44 U/L (ref 0–55)
ANION GAP: 6 (ref 3–11)
AST: 14 U/L (ref 5–34)
BILIRUBIN TOTAL: 0.6 mg/dL (ref 0.2–1.2)
BUN: 15 mg/dL (ref 7–26)
CALCIUM: 9.4 mg/dL (ref 8.4–10.4)
CO2: 30 mmol/L — AB (ref 22–29)
CREATININE: 0.81 mg/dL (ref 0.70–1.30)
Chloride: 105 mmol/L (ref 98–109)
GFR, Est AFR Am: >60 mL/min (ref >60)
GFR, Estimated: >60 mL/min (ref >60)
GLUCOSE: 97 mg/dL (ref 70–140)
Potassium: 4.3 mmol/L (ref 3.5–5.1)
Sodium: 141 mmol/L (ref 136–145)
Total Protein: 6.7 g/dL (ref 6.4–8.3)

## 2017-11-21 NOTE — Progress Notes (Signed)
[facility] at [place], [place] [address] [phone]   Interval Evaluation  Date of Service: 11/21/17 Patient Name: Christopher Morris Patient MRN: [id number] Patient DOB: December 24, 1940 Provider: Ventura Sellers, MD  Identifying Statement:  Christopher Morris is a 77 y.o. male with left frontal glioblastoma    Oncologic History:   Frontal glioblastoma multiforme ([place])   09/30/2017 Surgery    Craniotomy, resection by Dr. Sherwood Gambler.  Path demonstrates glioblastoma      10/28/2017 -  Radiation Therapy    IMRT with concurrent Temodar       Biomarkers:  MGMT Methylated.  IDH 1/2 Unknown.  EGFR Unknown  TERT Unknown   Interval History:  Christopher Morris presents today for follow up.  He and his daughter describe a fall which occurred last week and prompted emergency care and a subsequent admission.  He was walking to the bathroom, when he fell- he does not remember falling, daugter was downstairs and heard a "thud".  Once down, he could not get himself up because of right arm and leg weakness.  Daughter noted that he was not able to speak for several minutes.  EMS found him to be febrile to 101.  He was admitted for sepsis workup, especially given recent treatment for pneumonia.  He returned to baseline and was discharged within 48 hours.  He denies any symptoms since the admission.  He has been walking independently mostly without the walker that has been recommended by physical therapy.  Continues to undergo daily RT but has not started Temodar yet.    Medications: Current Outpatient Medications on File Prior to Visit  Medication Sig Dispense Refill  . acetaminophen (TYLENOL) 325 MG tablet Take 2 tablets (650 mg total) by mouth every 6 (six) hours as needed for mild pain (or Fever >/= 101).    . cyanocobalamin (,VITAMIN B-12,) 1000 MCG/ML injection Inject 1 mL (1,000 mcg total) into the muscle daily. Take Vitamin B12 1018mg IM daily x 5  days, then vitamin B12 10032m IM weekly, then vitamin B12 100081mmonthly. 10 mL 0  . dexamethasone (DECADRON) 4 MG tablet Take 1 tablet (4 mg total) by mouth 2 (two) times daily. 30 tablet 2  . feeding supplement, ENSURE ENLIVE, (ENSURE ENLIVE) LIQD Take 237 mLs by mouth 2 (two) times daily between meals. 237 mL 12  . levETIRAcetam (KEPPRA) 500 MG tablet Take 1 tablet (500 mg total) by mouth 2 (two) times daily. 60 tablet 0  . SYRINGE-NEEDLE, DISP, 5 ML (SAFETY SYRINGES/NEEDLE) 21G X 1-1/2" 5 ML MISC 1,000 mcg by Does not apply route daily. 50 each 0  . amoxicillin-clavulanate (AUGMENTIN) 875-125 MG tablet Take 1 tablet by mouth every 12 (twelve) hours for 5 days. (Patient not taking: Reported on 11/21/2017) 10 tablet 0  . ondansetron (ZOFRAN) 8 MG tablet Take 1 tablet (8 mg total) by mouth 2 (two) times daily as needed. Start on the third day after chemotherapy. (Patient not taking: Reported on 11/21/2017) 30 tablet 1  . polyethylene glycol (MIRALAX / GLYCOLAX) packet Take 17 g by mouth daily. (Patient not taking: Reported on 11/21/2017) 14 each 0  . senna-docusate (SENOKOT-S) 8.6-50 MG tablet Take 1 tablet by mouth 2 (two) times daily. (Patient not taking: Reported on 11/21/2017)     No current facility-administered medications on file prior to visit.     Allergies:  Allergies  Allergen Reactions  . Pork-Derived Products     Patient does not eat  pork  . Shellfish Allergy     Patient does not eat shellfish   Past Medical History:  Past Medical History:  Diagnosis Date  . Brain cancer ([place])   . Glioblastoma ([place]) 09/25/2017  . Hyperglycemia 09/25/2017  . Seizure ([facility]) 09/25/2017   Past Surgical History:  Past Surgical History:  Procedure Laterality Date  . APPLICATION OF CRANIAL NAVIGATION N/A 09/30/2017   Procedure: APPLICATION OF CRANIAL NAVIGATION;  Surgeon: [name], MD;  Location: [facility];  Service: Neurosurgery;  Laterality: N/A;  . CRANIOTOMY N/A 09/30/2017   Procedure:  CRANIOTOMY FOR RESECTION OF BRAIN TUMOR WITH STEREOTACTIC;  Surgeon: [name], MD;  Location: [facility];  Service: Neurosurgery;  Laterality: N/A;  CRANIOTOMY FOR RESECTION OF BRAIN TUMOR WITH STEREOTACTIC   Social History:  Social History   Socioeconomic History  . Marital status: Divorced    Spouse name: Not on file  . Number of children: Not on file  . Years of education: Not on file  . Highest education level: Not on file  Occupational History  . Not on file  Social Needs  . Financial resource strain: Not on file  . Food insecurity:    Worry: Not on file    Inability: Not on file  . Transportation needs:    Medical: No    Non-medical: No  Tobacco Use  . Smoking status: Never Smoker  . Smokeless tobacco: Never Used  Substance and Sexual Activity  . Alcohol use: No    Frequency: Never  . Drug use: No  . Sexual activity: Not Currently  Lifestyle  . Physical activity:    Days per week: 5 days    Minutes per session: Not on file  . Stress: Only a little  Relationships  . Social connections:    Talks on phone: Not on file    Gets together: Not on file    Attends religious service: Not on file    Active member of club or organization: Not on file    Attends meetings of clubs or organizations: Not on file    Relationship status: Not on file  . Intimate partner violence:    Fear of current or ex partner: Not on file    Emotionally abused: Not on file    Physically abused: Not on file    Forced sexual activity: Not on file  Other Topics Concern  . Not on file  Social History Narrative  . Not on file   Family History:  Family History  Problem Relation Age of Onset  . Hypertension Daughter   . Stroke Mother   . Cancer Neg Hx     Review of Systems: Constitutional: Denies fevers, chills or abnormal weight loss Eyes: Denies blurriness of vision Ears, nose, mouth, throat, and face: Denies mucositis or sore throat Respiratory: Denies cough, dyspnea or  wheezes Cardiovascular: Denies palpitation, chest discomfort or lower extremity swelling Gastrointestinal:  Denies nausea, constipation, diarrhea GU: Denies dysuria or incontinence Skin: Denies abnormal skin rashes Neurological: Per HPI Musculoskeletal: Denies joint pain, back or neck discomfort. No decrease in ROM Behavioral/Psych: Denies anxiety, disturbance in thought content, and mood instability  Physical Exam: Vitals:   11/21/17 0945  BP: 127/78  Pulse: 81  Resp: 18  Temp: 97.7 F (36.5 C)  SpO2: 100%   KPS: 80. General: Alert, cooperative, pleasant, in no acute distress Head: Craniotomy scar noted, dry and intact. EENT: No conjunctival injection or scleral icterus. Oral mucosa moist Lungs: Resp effort normal Cardiac: Regular  rate and rhythm Abdomen: Soft, non-distended abdomen Skin: No rashes cyanosis or petechiae. Extremities: No clubbing or edema  Neurologic Exam: Mental Status: Awake, alert, attentive to examiner. Oriented to self and environment. Language has mild impairment in fluency with intact comprehension and repetition.  Cranial Nerves: Visual acuity is grossly normal. Visual fields are full. Extra-ocular movements intact. No ptosis. Face is symmetric, tongue midline. Motor: Tone and bulk are normal. Right arm and leg 5/5, left side 5/5 power. Reflexes are symmetric, no pathologic reflexes present. Intact finger to nose bilaterally Sensory: Intact to light touch and temperature Gait: Independent but purposeful and wide based   Labs: I have reviewed the data as listed    Component Value Date/Time   NA 139 11/16/2017 0550   NA 136 (A) 10/18/2017   K 5.1 11/16/2017 0550   CL 102 11/16/2017 0550   CO2 26 11/16/2017 0550   GLUCOSE 172 (H) 11/16/2017 0550   BUN 19 11/16/2017 0550   BUN 19 10/18/2017   CREATININE 0.76 11/16/2017 0550   CREATININE 0.92 11/07/2017 0828   CALCIUM 9.2 11/16/2017 0550   PROT 6.4 (L) 11/13/2017 1205   ALBUMIN 2.5 (L)  11/13/2017 1205   AST 32 11/13/2017 1205   AST 19 11/07/2017 0828   ALT 65 (H) 11/13/2017 1205   ALT 36 11/07/2017 0828   ALKPHOS 58 11/13/2017 1205   BILITOT 0.9 11/13/2017 1205   BILITOT 0.9 11/07/2017 0828   GFRNONAA >60 11/16/2017 0550   GFRNONAA >60 11/07/2017 0828   GFRAA >60 11/16/2017 0550   GFRAA >60 11/07/2017 0828   Lab Results  Component Value Date   WBC 9.4 11/21/2017   NEUTROABS 5.0 11/21/2017   HGB 11.2 (L) 11/21/2017   HCT 34.1 (L) 11/21/2017   MCV 97.2 11/21/2017   PLT 201 11/21/2017    Assessment/Plan 1. Glioblastoma ([place])  2. Seizure [facility])  Christopher Morris is clinically stable following his recent admission for fall, fever.  Although no source was found, he was treated and is almost complete with his course of Augmentin.  This inciting event was most likely a focal seizure with Todd's paralysis, likely provoked by the fever.    We recommend increasing Keppra to '1000mg'$  BID.  Decadron can continue to be tapered- should take '1mg'$  QOD for 10 days, then stop.  Because of recurrent admissions and falls, we discussed and agreed on plan to withhold concurrent Temodar for the remaining 2+ weeks of radiation.  We appreciate the opportunity to participate in the care of Christopher Morris.  He should return to clinic in 2 weeks, during week 6 of radiation, for further evaluation.  All questions were answered. The patient knows to call the clinic with any problems, questions or concerns. No barriers to learning were detected.  The total time spent in the encounter was 40 minutes and more than 50% was on counseling and review of test results   Ventura Sellers, MD Medical Director of Neuro-Oncology [facility] [place] 11/21/17 9:58 AM

## 2017-11-21 NOTE — Telephone Encounter (Signed)
Scheduled appt per 5/9 los - Gave patient aVS and calender per los.

## 2017-11-22 ENCOUNTER — Ambulatory Visit
Admission: RE | Admit: 2017-11-22 | Discharge: 2017-11-22 | Disposition: A | Discharge status: Home / Self Care | Payer: BLUE CROSS/BLUE SHIELD | Source: Ambulatory Visit | Attending: Radiation Oncology | Admitting: Radiation Oncology

## 2017-11-22 DIAGNOSIS — C711 Malignant neoplasm of frontal lobe: Secondary | ICD-10-CM | POA: Diagnosis not present

## 2017-11-25 ENCOUNTER — Ambulatory Visit
Admission: RE | Admit: 2017-11-25 | Discharge: 2017-11-25 | Disposition: A | Discharge status: Home / Self Care | Payer: BLUE CROSS/BLUE SHIELD | Source: Ambulatory Visit | Attending: Radiation Oncology | Admitting: Radiation Oncology

## 2017-11-25 DIAGNOSIS — C711 Malignant neoplasm of frontal lobe: Secondary | ICD-10-CM | POA: Diagnosis not present

## 2017-11-26 ENCOUNTER — Ambulatory Visit
Admission: RE | Admit: 2017-11-26 | Discharge: 2017-11-26 | Disposition: A | Discharge status: Home / Self Care | Payer: BLUE CROSS/BLUE SHIELD | Source: Ambulatory Visit | Attending: Radiation Oncology | Admitting: Radiation Oncology

## 2017-11-26 ENCOUNTER — Other Ambulatory Visit: Payer: Self-pay | Admitting: *Deleted

## 2017-11-26 DIAGNOSIS — N390 Urinary tract infection, site not specified: Secondary | ICD-10-CM | POA: Diagnosis not present

## 2017-11-26 DIAGNOSIS — A4151 Sepsis due to Escherichia coli [E. coli]: Secondary | ICD-10-CM | POA: Diagnosis not present

## 2017-11-26 DIAGNOSIS — D630 Anemia in neoplastic disease: Secondary | ICD-10-CM | POA: Diagnosis not present

## 2017-11-26 DIAGNOSIS — R569 Unspecified convulsions: Secondary | ICD-10-CM | POA: Diagnosis not present

## 2017-11-26 DIAGNOSIS — J189 Pneumonia, unspecified organism: Secondary | ICD-10-CM | POA: Diagnosis not present

## 2017-11-26 DIAGNOSIS — C711 Malignant neoplasm of frontal lobe: Secondary | ICD-10-CM | POA: Diagnosis not present

## 2017-11-26 MED ORDER — LEVETIRACETAM 1000 MG PO TABS: 1000.0000 mg | ORAL_TABLET | Freq: Two times a day (BID) | ORAL | 1 refills | Status: DC

## 2017-11-27 ENCOUNTER — Ambulatory Visit
Admission: RE | Admit: 2017-11-27 | Discharge: 2017-11-27 | Disposition: A | Discharge status: Home / Self Care | Payer: BLUE CROSS/BLUE SHIELD | Source: Ambulatory Visit | Attending: Radiation Oncology | Admitting: Radiation Oncology

## 2017-11-27 DIAGNOSIS — J189 Pneumonia, unspecified organism: Secondary | ICD-10-CM | POA: Diagnosis not present

## 2017-11-27 DIAGNOSIS — D630 Anemia in neoplastic disease: Secondary | ICD-10-CM | POA: Diagnosis not present

## 2017-11-27 DIAGNOSIS — A4151 Sepsis due to Escherichia coli [E. coli]: Secondary | ICD-10-CM | POA: Diagnosis not present

## 2017-11-27 DIAGNOSIS — R569 Unspecified convulsions: Secondary | ICD-10-CM | POA: Diagnosis not present

## 2017-11-27 DIAGNOSIS — C711 Malignant neoplasm of frontal lobe: Secondary | ICD-10-CM | POA: Diagnosis not present

## 2017-11-27 DIAGNOSIS — N390 Urinary tract infection, site not specified: Secondary | ICD-10-CM | POA: Diagnosis not present

## 2017-11-28 ENCOUNTER — Ambulatory Visit: Payer: BLUE CROSS/BLUE SHIELD

## 2017-11-28 ENCOUNTER — Ambulatory Visit
Admission: RE | Admit: 2017-11-28 | Discharge: 2017-11-28 | Disposition: A | Discharge status: Home / Self Care | Payer: BLUE CROSS/BLUE SHIELD | Source: Ambulatory Visit | Attending: Radiation Oncology | Admitting: Radiation Oncology

## 2017-11-28 DIAGNOSIS — D630 Anemia in neoplastic disease: Secondary | ICD-10-CM | POA: Diagnosis not present

## 2017-11-28 DIAGNOSIS — J189 Pneumonia, unspecified organism: Secondary | ICD-10-CM | POA: Diagnosis not present

## 2017-11-28 DIAGNOSIS — N390 Urinary tract infection, site not specified: Secondary | ICD-10-CM | POA: Diagnosis not present

## 2017-11-28 DIAGNOSIS — A4151 Sepsis due to Escherichia coli [E. coli]: Secondary | ICD-10-CM | POA: Diagnosis not present

## 2017-11-28 DIAGNOSIS — R569 Unspecified convulsions: Secondary | ICD-10-CM | POA: Diagnosis not present

## 2017-11-28 DIAGNOSIS — C711 Malignant neoplasm of frontal lobe: Secondary | ICD-10-CM | POA: Diagnosis not present

## 2017-11-29 ENCOUNTER — Ambulatory Visit: Payer: BLUE CROSS/BLUE SHIELD

## 2017-11-29 ENCOUNTER — Ambulatory Visit
Admission: RE | Admit: 2017-11-29 | Discharge: 2017-11-29 | Disposition: A | Discharge status: Home / Self Care | Payer: BLUE CROSS/BLUE SHIELD | Source: Ambulatory Visit | Attending: Radiation Oncology | Admitting: Radiation Oncology

## 2017-11-29 DIAGNOSIS — C711 Malignant neoplasm of frontal lobe: Secondary | ICD-10-CM | POA: Diagnosis not present

## 2017-12-02 ENCOUNTER — Ambulatory Visit
Admission: RE | Admit: 2017-12-02 | Discharge: 2017-12-02 | Disposition: A | Discharge status: Home / Self Care | Payer: BLUE CROSS/BLUE SHIELD | Source: Ambulatory Visit | Attending: Radiation Oncology | Admitting: Radiation Oncology

## 2017-12-02 DIAGNOSIS — C711 Malignant neoplasm of frontal lobe: Secondary | ICD-10-CM | POA: Diagnosis not present

## 2017-12-03 ENCOUNTER — Ambulatory Visit
Admission: RE | Admit: 2017-12-03 | Discharge: 2017-12-03 | Disposition: A | Discharge status: Home / Self Care | Payer: BLUE CROSS/BLUE SHIELD | Source: Ambulatory Visit | Attending: Radiation Oncology | Admitting: Radiation Oncology

## 2017-12-03 DIAGNOSIS — N390 Urinary tract infection, site not specified: Secondary | ICD-10-CM | POA: Diagnosis not present

## 2017-12-03 DIAGNOSIS — J189 Pneumonia, unspecified organism: Secondary | ICD-10-CM | POA: Diagnosis not present

## 2017-12-03 DIAGNOSIS — A4151 Sepsis due to Escherichia coli [E. coli]: Secondary | ICD-10-CM | POA: Diagnosis not present

## 2017-12-03 DIAGNOSIS — D630 Anemia in neoplastic disease: Secondary | ICD-10-CM | POA: Diagnosis not present

## 2017-12-03 DIAGNOSIS — C711 Malignant neoplasm of frontal lobe: Secondary | ICD-10-CM | POA: Diagnosis not present

## 2017-12-03 DIAGNOSIS — R569 Unspecified convulsions: Secondary | ICD-10-CM | POA: Diagnosis not present

## 2017-12-04 ENCOUNTER — Ambulatory Visit
Admission: RE | Admit: 2017-12-04 | Discharge: 2017-12-04 | Disposition: A | Discharge status: Home / Self Care | Payer: BLUE CROSS/BLUE SHIELD | Source: Ambulatory Visit | Attending: Radiation Oncology | Admitting: Radiation Oncology

## 2017-12-04 DIAGNOSIS — C711 Malignant neoplasm of frontal lobe: Secondary | ICD-10-CM | POA: Diagnosis not present

## 2017-12-05 ENCOUNTER — Encounter: Payer: Self-pay | Admitting: Internal Medicine

## 2017-12-05 ENCOUNTER — Inpatient Hospital Stay (HOSPITAL_BASED_OUTPATIENT_CLINIC_OR_DEPARTMENT_OTHER): Payer: BLUE CROSS/BLUE SHIELD | Admitting: Internal Medicine

## 2017-12-05 ENCOUNTER — Ambulatory Visit
Admission: RE | Admit: 2017-12-05 | Discharge: 2017-12-05 | Disposition: A | Discharge status: Home / Self Care | Payer: BLUE CROSS/BLUE SHIELD | Source: Ambulatory Visit | Attending: Radiation Oncology | Admitting: Radiation Oncology

## 2017-12-05 VITALS — BP 124/58 | HR 88 | Temp 98.0°F | Resp 18 | Ht 76.0 in | Wt 188.5 lb

## 2017-12-05 DIAGNOSIS — R5383 Other fatigue: Secondary | ICD-10-CM | POA: Diagnosis not present

## 2017-12-05 DIAGNOSIS — R569 Unspecified convulsions: Secondary | ICD-10-CM | POA: Diagnosis not present

## 2017-12-05 DIAGNOSIS — J189 Pneumonia, unspecified organism: Secondary | ICD-10-CM | POA: Diagnosis not present

## 2017-12-05 DIAGNOSIS — C711 Malignant neoplasm of frontal lobe: Secondary | ICD-10-CM | POA: Diagnosis not present

## 2017-12-05 DIAGNOSIS — N390 Urinary tract infection, site not specified: Secondary | ICD-10-CM | POA: Diagnosis not present

## 2017-12-05 DIAGNOSIS — D630 Anemia in neoplastic disease: Secondary | ICD-10-CM | POA: Diagnosis not present

## 2017-12-05 DIAGNOSIS — A4151 Sepsis due to Escherichia coli [E. coli]: Secondary | ICD-10-CM | POA: Diagnosis not present

## 2017-12-05 NOTE — Progress Notes (Signed)
[facility] at [facility] [place], [place] [address] [phone]   Interval Evaluation  Date of Service: 12/05/17 Patient Name: Christopher Morris Patient MRN: [id number] Patient DOB: 03/07/1941 Provider: Ventura Sellers, MD  Identifying Statement:  Christopher Morris is a 77 y.o. male with left frontal glioblastoma    Oncologic History:   Frontal glioblastoma multiforme ([place])   09/30/2017 Surgery    Craniotomy, resection by Dr. Sherwood Morris.  Path demonstrates glioblastoma      10/28/2017 -  Radiation Therapy    IMRT with concurrent Temodar       Biomarkers:  MGMT Methylated.  IDH 1/2 Unknown.  EGFR Unknown  TERT Unknown   Interval History:  Christopher Morris presents today for follow up.  No recent hospitalizations as prior.  Denies seizures, headaches, new or progressive neurologic deficits.  There is a modest increase in fatigue the past 2 weeks with radiation.  He is currently in week 6/6.  Not taking Temodar.  Medications: Current Outpatient Medications on File Prior to Visit  Medication Sig Dispense Refill  . acetaminophen (TYLENOL) 325 MG tablet Take 2 tablets (650 mg total) by mouth every 6 (six) hours as needed for mild pain (or Fever >/= 101).    . cyanocobalamin (,VITAMIN B-12,) 1000 MCG/ML injection Inject 1 mL (1,000 mcg total) into the muscle daily. Take Vitamin B12 1062mg IM daily x 5 days, then vitamin B12 10071m IM weekly, then vitamin B12 100068mmonthly. 10 mL 0  . dexamethasone (DECADRON) 4 MG tablet Take 1 tablet (4 mg total) by mouth 2 (two) times daily. 30 tablet 2  . feeding supplement, ENSURE ENLIVE, (ENSURE ENLIVE) LIQD Take 237 mLs by mouth 2 (two) times daily between meals. 237 mL 12  . levETIRAcetam (KEPPRA) 1000 MG tablet Take 1 tablet (1,000 mg total) by mouth 2 (two) times daily. 30 tablet 1  . ondansetron (ZOFRAN) 8 MG tablet Take 1 tablet (8 mg total) by mouth 2 (two) times daily as needed. Start on the third  day after chemotherapy. (Patient not taking: Reported on 11/21/2017) 30 tablet 1  . polyethylene glycol (MIRALAX / GLYCOLAX) packet Take 17 g by mouth daily. (Patient not taking: Reported on 11/21/2017) 14 each 0  . senna-docusate (SENOKOT-S) 8.6-50 MG tablet Take 1 tablet by mouth 2 (two) times daily. (Patient not taking: Reported on 11/21/2017)    . SYRINGE-NEEDLE, DISP, 5 ML (SAFETY SYRINGES/NEEDLE) 21G X 1-1/2" 5 ML MISC 1,000 mcg by Does not apply route daily. 50 each 0   No current facility-administered medications on file prior to visit.     Allergies:  Allergies  Allergen Reactions  . Pork-Derived Products     Patient does not eat pork  . Shellfish Allergy     Patient does not eat shellfish   Past Medical History:  Past Medical History:  Diagnosis Date  . Brain cancer ([facility] . [place]lioblastoma ([facility]/13/2019  . Hyperglycemia 09/25/2017  . Seizure ([facility]/13/2019   Past Surgical History:  Past Surgical History:  Procedure Laterality Date  . APPLICATION OF CRANIAL NAVIGATION N/A 09/30/2017   Procedure: APPLICATION OF CRANIAL NAVIGATION;  Surgeon: NudJovita GammaD;  [facility];  Laterality: N/A;  . CRANIOTOMY N/A 09/30/2017   Procedure: CRANIOTOMY FOR RESECTION OF BRAIN TUMOR WITH STEREOTACTIC;  Surgeon: NudJovita GammaD;  [facility];  Laterality: N/A;  CRANIOTOMY FOR RESECTION OF BRAIN TUMOR WITH STEREOTACTIC  Social History:  Social History   Socioeconomic History  . Marital status: Divorced    Spouse name: Not on file  . Number of children: Not on file  . Years of education: Not on file  . Highest education level: Not on file  Occupational History  . Not on file  Social Needs  . Financial resource strain: Not on file  . Food insecurity:    Worry: Not on file    Inability: Not on file  . Transportation needs:    Medical: No    Non-medical: No  Tobacco Use  . Smoking status: Never Smoker  . Smokeless  tobacco: Never Used  Substance and Sexual Activity  . Alcohol use: No    Frequency: Never  . Drug use: No  . Sexual activity: Not Currently  Lifestyle  . Physical activity:    Days per week: 5 days    Minutes per session: Not on file  . Stress: Only a little  Relationships  . Social connections:    Talks on phone: Not on file    Gets together: Not on file    Attends religious service: Not on file    Active member of club or organization: Not on file    Attends meetings of clubs or organizations: Not on file    Relationship status: Not on file  . Intimate partner violence:    Fear of current or ex partner: Not on file    Emotionally abused: Not on file    Physically abused: Not on file    Forced sexual activity: Not on file  Other Topics Concern  . Not on file  Social History Narrative  . Not on file   Family History:  Family History  Problem Relation Age of Onset  . Hypertension Daughter   . Stroke Mother   . Cancer Neg Hx     Review of Systems: Constitutional: Denies fevers, chills or abnormal weight loss Eyes: Denies blurriness of vision Ears, nose, mouth, throat, and face: Denies mucositis or sore throat Respiratory: Denies cough, dyspnea or wheezes Cardiovascular: Denies palpitation, chest discomfort or lower extremity swelling Gastrointestinal:  Denies nausea, constipation, diarrhea GU: Denies dysuria or incontinence Skin: Denies abnormal skin rashes Neurological: Per HPI Musculoskeletal: Denies joint pain, back or neck discomfort. No decrease in ROM Behavioral/Psych: Denies anxiety, disturbance in thought content, and mood instability  Physical Exam: Vitals:   12/05/17 1020  BP: (!) 124/58  Pulse: 88  Resp: 18  Temp: 98 F (36.7 C)  SpO2: 100%   KPS: 80. General: Alert, cooperative, pleasant, in no acute distress Head: Craniotomy scar noted, dry and intact. EENT: No conjunctival injection or scleral icterus. Oral mucosa moist Lungs: Resp effort  normal Cardiac: Regular rate and rhythm Abdomen: Soft, non-distended abdomen Skin: No rashes cyanosis or petechiae. Extremities: No clubbing or edema  Neurologic Exam: Mental Status: Awake, alert, attentive to examiner. Oriented to self and environment. Language has mild impairment in fluency with intact comprehension and repetition.  Cranial Nerves: Visual acuity is grossly normal. Visual fields are full. Extra-ocular movements intact. No ptosis. Face is symmetric, tongue midline. Motor: Tone and bulk are normal. Right arm and leg 5/5, left side 5/5 power. Reflexes are symmetric, no pathologic reflexes present. Intact finger to nose bilaterally Sensory: Intact to light touch and temperature Gait: Independent but purposeful and wide based   Labs: I have reviewed the data as listed    Component Value Date/Time   NA 141 11/21/2017 0924  NA 136 (A) 10/18/2017   K 4.3 11/21/2017 0924   CL 105 11/21/2017 0924   CO2 30 (H) 11/21/2017 0924   GLUCOSE 97 11/21/2017 0924   BUN 15 11/21/2017 0924   BUN 19 10/18/2017   CREATININE 0.81 11/21/2017 0924   CALCIUM 9.4 11/21/2017 0924   PROT 6.7 11/21/2017 0924   ALBUMIN 3.2 (L) 11/21/2017 0924   AST 14 11/21/2017 0924   ALT 44 11/21/2017 0924   ALKPHOS 70 11/21/2017 0924   BILITOT 0.6 11/21/2017 0924   GFRNONAA >60 11/21/2017 0924   GFRAA >60 11/21/2017 0924   Lab Results  Component Value Date   WBC 9.4 11/21/2017   NEUTROABS 5.0 11/21/2017   HGB 11.2 (L) 11/21/2017   HCT 34.1 (L) 11/21/2017   MCV 97.2 11/21/2017   PLT 201 11/21/2017    Assessment/Plan 1. Glioblastoma ([place])  2. Seizure [facility])  Mr. Vandervliet is clinically stable now having almost completed radiation.  He has tapered off of decadron succesfully.  We recommend he return in 3-4 weeks with an MRI brain for review.  At that time we will likely recommend proceeding to adjuvant chemotherapy.  All questions were answered. The patient knows to call the clinic with any  problems, questions or concerns. No barriers to learning were detected.  The total time spent in the encounter was 25 minutes and more than 50% was on counseling and review of test results   Christopher Sellers, MD Medical Director of Neuro-Oncology [facility] [place] 12/05/17 10:16 AM

## 2017-12-06 ENCOUNTER — Telehealth: Payer: Self-pay

## 2017-12-06 ENCOUNTER — Ambulatory Visit
Admission: RE | Admit: 2017-12-06 | Discharge: 2017-12-06 | Disposition: A | Discharge status: Home / Self Care | Payer: BLUE CROSS/BLUE SHIELD | Source: Ambulatory Visit | Attending: Radiation Oncology | Admitting: Radiation Oncology

## 2017-12-06 ENCOUNTER — Ambulatory Visit: Payer: BLUE CROSS/BLUE SHIELD

## 2017-12-06 DIAGNOSIS — D630 Anemia in neoplastic disease: Secondary | ICD-10-CM | POA: Diagnosis not present

## 2017-12-06 DIAGNOSIS — R569 Unspecified convulsions: Secondary | ICD-10-CM | POA: Diagnosis not present

## 2017-12-06 DIAGNOSIS — C711 Malignant neoplasm of frontal lobe: Secondary | ICD-10-CM | POA: Diagnosis not present

## 2017-12-06 DIAGNOSIS — J189 Pneumonia, unspecified organism: Secondary | ICD-10-CM | POA: Diagnosis not present

## 2017-12-06 DIAGNOSIS — A4151 Sepsis due to Escherichia coli [E. coli]: Secondary | ICD-10-CM | POA: Diagnosis not present

## 2017-12-06 DIAGNOSIS — N390 Urinary tract infection, site not specified: Secondary | ICD-10-CM | POA: Diagnosis not present

## 2017-12-06 NOTE — Telephone Encounter (Signed)
Will mail a calender of upcoming appointment. Per 5/24 los

## 2017-12-09 ENCOUNTER — Ambulatory Visit: Payer: BLUE CROSS/BLUE SHIELD

## 2017-12-10 ENCOUNTER — Telehealth: Payer: Self-pay | Admitting: *Deleted

## 2017-12-10 ENCOUNTER — Encounter: Payer: Self-pay | Admitting: Radiation Oncology

## 2017-12-10 ENCOUNTER — Ambulatory Visit
Admission: RE | Admit: 2017-12-10 | Discharge: 2017-12-10 | Disposition: A | Discharge status: Home / Self Care | Payer: BLUE CROSS/BLUE SHIELD | Source: Ambulatory Visit | Attending: Radiation Oncology | Admitting: Radiation Oncology

## 2017-12-10 DIAGNOSIS — A4151 Sepsis due to Escherichia coli [E. coli]: Secondary | ICD-10-CM | POA: Diagnosis not present

## 2017-12-10 DIAGNOSIS — N390 Urinary tract infection, site not specified: Secondary | ICD-10-CM | POA: Diagnosis not present

## 2017-12-10 DIAGNOSIS — J189 Pneumonia, unspecified organism: Secondary | ICD-10-CM | POA: Diagnosis not present

## 2017-12-10 DIAGNOSIS — R569 Unspecified convulsions: Secondary | ICD-10-CM | POA: Diagnosis not present

## 2017-12-10 DIAGNOSIS — C711 Malignant neoplasm of frontal lobe: Secondary | ICD-10-CM | POA: Diagnosis not present

## 2017-12-10 DIAGNOSIS — D630 Anemia in neoplastic disease: Secondary | ICD-10-CM | POA: Diagnosis not present

## 2017-12-10 NOTE — Telephone Encounter (Signed)
Patients daughter called about increased swelling to both feet/ankles and some up the calf area.  No pain assoicated, patient is elevating and decreasing sodium intake.    Patient was evaluated by nurse at last radiation treatment appt today.  Mild 1+ pitting present, daughter states it has improved some over the night.  Educated on identifiers that would require further examination per Dr Mickeal Skinner such as pain in calf, warmth, asymmetry between legs.  Advised to get some leg compressions and use daily with break at night. Daughter able to teach back and express understanding.

## 2017-12-12 DIAGNOSIS — N390 Urinary tract infection, site not specified: Secondary | ICD-10-CM | POA: Diagnosis not present

## 2017-12-12 DIAGNOSIS — D630 Anemia in neoplastic disease: Secondary | ICD-10-CM | POA: Diagnosis not present

## 2017-12-12 DIAGNOSIS — J189 Pneumonia, unspecified organism: Secondary | ICD-10-CM | POA: Diagnosis not present

## 2017-12-12 DIAGNOSIS — R569 Unspecified convulsions: Secondary | ICD-10-CM | POA: Diagnosis not present

## 2017-12-12 DIAGNOSIS — A4151 Sepsis due to Escherichia coli [E. coli]: Secondary | ICD-10-CM | POA: Diagnosis not present

## 2017-12-12 DIAGNOSIS — C711 Malignant neoplasm of frontal lobe: Secondary | ICD-10-CM | POA: Diagnosis not present

## 2017-12-12 NOTE — Progress Notes (Signed)
  Radiation Oncology         [phone]) [phone] ________________________________  Name: Christopher Morris MRN: [id number]  Date: 12/10/2017  DOB: 1940-10-19  End of Treatment Note  Diagnosis:   77 y.o. male with Glioblastoma of the left frontal lobe    Indication for treatment:  Curative       Radiation treatment dates:   10/28/2017 - 12/10/2017  Site/dose:   The left frontal lobe was initially treated to 46 Gy in 23 fractions, followed by a 14 Gy boost in 7 fractions to yield a total dose of 60 Gy.  Beams/energy:   3D / 6X Photon  Narrative: The patient tolerated radiation treatment relatively well.  No complaints of pain, headaches, visual disturbances, speech changes, dizziness, or fatigue. His gait and balance have improved since starting physical therapy. Skin in treatment field is hyperpigmented and dry. He is currently using Sonafine to this area.   Plan: The patient has completed radiation treatment. The patient will return to radiation oncology clinic for routine followup in one month. I advised them to call or return sooner if they have any questions or concerns related to their recovery or treatment.  ------------------------------------------------  Jodelle Gross, MD, PhD  This document serves as a record of services personally performed by Kyung Rudd, MD. It was created on his behalf by Rae Lips, a trained medical scribe. The creation of this record is based on the scribe's personal observations and the provider's statements to them. This document has been checked and approved by the attending provider.

## 2017-12-16 DIAGNOSIS — N390 Urinary tract infection, site not specified: Secondary | ICD-10-CM | POA: Diagnosis not present

## 2017-12-16 DIAGNOSIS — A4151 Sepsis due to Escherichia coli [E. coli]: Secondary | ICD-10-CM | POA: Diagnosis not present

## 2017-12-16 DIAGNOSIS — D630 Anemia in neoplastic disease: Secondary | ICD-10-CM | POA: Diagnosis not present

## 2017-12-16 DIAGNOSIS — C711 Malignant neoplasm of frontal lobe: Secondary | ICD-10-CM | POA: Diagnosis not present

## 2017-12-16 DIAGNOSIS — R569 Unspecified convulsions: Secondary | ICD-10-CM | POA: Diagnosis not present

## 2017-12-16 DIAGNOSIS — J189 Pneumonia, unspecified organism: Secondary | ICD-10-CM | POA: Diagnosis not present

## 2017-12-18 ENCOUNTER — Emergency Department (HOSPITAL_COMMUNITY): Payer: Medicare Other

## 2017-12-18 ENCOUNTER — Inpatient Hospital Stay (HOSPITAL_COMMUNITY): Payer: Medicare Other

## 2017-12-18 ENCOUNTER — Inpatient Hospital Stay (HOSPITAL_COMMUNITY)
Admission: EM | Admit: 2017-12-18 | Discharge: 2017-12-20 | DRG: 871 | Disposition: A | Discharge status: Home Health | Payer: Medicare Other | Attending: Internal Medicine | Admitting: Internal Medicine

## 2017-12-18 ENCOUNTER — Other Ambulatory Visit: Payer: Self-pay

## 2017-12-18 ENCOUNTER — Encounter (HOSPITAL_COMMUNITY): Payer: Self-pay | Admitting: Emergency Medicine

## 2017-12-18 DIAGNOSIS — G40509 Epileptic seizures related to external causes, not intractable, without status epilepticus: Secondary | ICD-10-CM

## 2017-12-18 DIAGNOSIS — I82492 Acute embolism and thrombosis of other specified deep vein of left lower extremity: Secondary | ICD-10-CM | POA: Diagnosis present

## 2017-12-18 DIAGNOSIS — Z923 Personal history of irradiation: Secondary | ICD-10-CM

## 2017-12-18 DIAGNOSIS — Z79899 Other long term (current) drug therapy: Secondary | ICD-10-CM | POA: Diagnosis not present

## 2017-12-18 DIAGNOSIS — I82442 Acute embolism and thrombosis of left tibial vein: Secondary | ICD-10-CM | POA: Diagnosis present

## 2017-12-18 DIAGNOSIS — I2699 Other pulmonary embolism without acute cor pulmonale: Secondary | ICD-10-CM | POA: Diagnosis present

## 2017-12-18 DIAGNOSIS — E538 Deficiency of other specified B group vitamins: Secondary | ICD-10-CM | POA: Diagnosis present

## 2017-12-18 DIAGNOSIS — D72825 Bandemia: Secondary | ICD-10-CM

## 2017-12-18 DIAGNOSIS — E876 Hypokalemia: Secondary | ICD-10-CM

## 2017-12-18 DIAGNOSIS — G40909 Epilepsy, unspecified, not intractable, without status epilepticus: Secondary | ICD-10-CM | POA: Diagnosis present

## 2017-12-18 DIAGNOSIS — Y95 Nosocomial condition: Secondary | ICD-10-CM | POA: Diagnosis present

## 2017-12-18 DIAGNOSIS — I82432 Acute embolism and thrombosis of left popliteal vein: Secondary | ICD-10-CM | POA: Diagnosis present

## 2017-12-18 DIAGNOSIS — D72829 Elevated white blood cell count, unspecified: Secondary | ICD-10-CM | POA: Diagnosis not present

## 2017-12-18 DIAGNOSIS — D519 Vitamin B12 deficiency anemia, unspecified: Secondary | ICD-10-CM

## 2017-12-18 DIAGNOSIS — R109 Unspecified abdominal pain: Secondary | ICD-10-CM | POA: Diagnosis not present

## 2017-12-18 DIAGNOSIS — R509 Fever, unspecified: Secondary | ICD-10-CM | POA: Diagnosis not present

## 2017-12-18 DIAGNOSIS — C719 Malignant neoplasm of brain, unspecified: Secondary | ICD-10-CM | POA: Diagnosis present

## 2017-12-18 DIAGNOSIS — R238 Other skin changes: Secondary | ICD-10-CM | POA: Diagnosis present

## 2017-12-18 DIAGNOSIS — A419 Sepsis, unspecified organism: Principal | ICD-10-CM

## 2017-12-18 DIAGNOSIS — I82412 Acute embolism and thrombosis of left femoral vein: Secondary | ICD-10-CM | POA: Diagnosis present

## 2017-12-18 DIAGNOSIS — J189 Pneumonia, unspecified organism: Secondary | ICD-10-CM | POA: Diagnosis present

## 2017-12-18 DIAGNOSIS — R609 Edema, unspecified: Secondary | ICD-10-CM | POA: Diagnosis not present

## 2017-12-18 DIAGNOSIS — R5081 Fever presenting with conditions classified elsewhere: Secondary | ICD-10-CM

## 2017-12-18 DIAGNOSIS — D649 Anemia, unspecified: Secondary | ICD-10-CM

## 2017-12-18 DIAGNOSIS — R000 Tachycardia, unspecified: Secondary | ICD-10-CM | POA: Insufficient documentation

## 2017-12-18 LAB — CBC WITH DIFFERENTIAL/PLATELET
Basophils Absolute: 0 10*3/uL (ref 0.0–0.1)
Basophils Relative: 0 %
EOS ABS: 0.4 10*3/uL (ref 0.0–0.7)
EOS PCT: 4 %
HCT: 31 % — ABNORMAL LOW (ref 39.0–52.0)
Hemoglobin: 10 g/dL — ABNORMAL LOW (ref 13.0–17.0)
LYMPHS ABS: 2.1 10*3/uL (ref 0.7–4.0)
LYMPHS PCT: 18 %
MCH: 30.6 pg (ref 26.0–34.0)
MCHC: 32.3 g/dL (ref 30.0–36.0)
MCV: 94.8 fL (ref 78.0–100.0)
MONO ABS: 0.9 10*3/uL (ref 0.1–1.0)
MONOS PCT: 8 %
Neutro Abs: 8 10*3/uL — ABNORMAL HIGH (ref 1.7–7.7)
Neutrophils Relative %: 70 %
PLATELETS: 231 10*3/uL (ref 150–400)
RBC: 3.27 MIL/uL — ABNORMAL LOW (ref 4.22–5.81)
RDW: 13.8 % (ref 11.5–15.5)
WBC: 11.4 10*3/uL — ABNORMAL HIGH (ref 4.0–10.5)

## 2017-12-18 LAB — RESPIRATORY PANEL BY PCR
Adenovirus: NOT DETECTED
BORDETELLA PERTUSSIS-RVPCR: NOT DETECTED
CHLAMYDOPHILA PNEUMONIAE-RVPPCR: NOT DETECTED
CORONAVIRUS OC43-RVPPCR: NOT DETECTED
Coronavirus 229E: NOT DETECTED
Coronavirus HKU1: NOT DETECTED
Coronavirus NL63: NOT DETECTED
INFLUENZA A-RVPPCR: NOT DETECTED
INFLUENZA B-RVPPCR: NOT DETECTED
MYCOPLASMA PNEUMONIAE-RVPPCR: NOT DETECTED
Metapneumovirus: NOT DETECTED
PARAINFLUENZA VIRUS 4-RVPPCR: NOT DETECTED
Parainfluenza Virus 1: NOT DETECTED
Parainfluenza Virus 2: NOT DETECTED
Parainfluenza Virus 3: NOT DETECTED
RESPIRATORY SYNCYTIAL VIRUS-RVPPCR: NOT DETECTED
Rhinovirus / Enterovirus: NOT DETECTED

## 2017-12-18 LAB — COMPREHENSIVE METABOLIC PANEL
ALK PHOS: 79 U/L (ref 38–126)
ALT: 13 U/L — AB (ref 17–63)
ALT: 12 U/L — ABNORMAL LOW (ref 17–63)
ANION GAP: 10 (ref 5–15)
AST: 15 U/L (ref 15–41)
AST: 19 U/L (ref 15–41)
Albumin: 4 g/dL (ref 3.5–5.0)
Albumin: 3.3 g/dL — ABNORMAL LOW (ref 3.5–5.0)
Alkaline Phosphatase: 95 U/L (ref 38–126)
Anion gap: 9 (ref 5–15)
BILIRUBIN TOTAL: 0.9 mg/dL (ref 0.3–1.2)
BUN: 8 mg/dL (ref 6–20)
BUN: 8 mg/dL (ref 6–20)
CALCIUM: 8.6 mg/dL — AB (ref 8.9–10.3)
CO2: 26 mmol/L (ref 22–32)
CO2: 24 mmol/L (ref 22–32)
CREATININE: 0.82 mg/dL (ref 0.61–1.24)
CREATININE: 0.76 mg/dL (ref 0.61–1.24)
Calcium: 9.2 mg/dL (ref 8.9–10.3)
Chloride: 104 mmol/L (ref 101–111)
Chloride: 106 mmol/L (ref 101–111)
GFR calc Af Amer: >60 mL/min (ref >60)
Glucose, Bld: 138 mg/dL — ABNORMAL HIGH (ref 65–99)
Glucose, Bld: 153 mg/dL — ABNORMAL HIGH (ref 65–99)
Potassium: 3.4 mmol/L — ABNORMAL LOW (ref 3.5–5.1)
Potassium: 3.5 mmol/L (ref 3.5–5.1)
SODIUM: 140 mmol/L (ref 135–145)
Sodium: 139 mmol/L (ref 135–145)
TOTAL PROTEIN: 6.3 g/dL — AB (ref 6.5–8.1)
Total Bilirubin: 0.8 mg/dL (ref 0.3–1.2)
Total Protein: 7.3 g/dL (ref 6.5–8.1)

## 2017-12-18 LAB — URINALYSIS, ROUTINE W REFLEX MICROSCOPIC
BILIRUBIN URINE: NEGATIVE
Glucose, UA: NEGATIVE mg/dL
HGB URINE DIPSTICK: NEGATIVE
KETONES UR: NEGATIVE mg/dL
Leukocytes, UA: NEGATIVE
Nitrite: NEGATIVE
PROTEIN: NEGATIVE mg/dL
Specific Gravity, Urine: 1.012 (ref 1.005–1.030)
pH: 5 (ref 5.0–8.0)

## 2017-12-18 LAB — TROPONIN I: Troponin I: <0.03 ng/mL (ref <0.03)

## 2017-12-18 LAB — CBC
HCT: 30.1 % — ABNORMAL LOW (ref 39.0–52.0)
HEMOGLOBIN: 9.8 g/dL — AB (ref 13.0–17.0)
MCH: 30.7 pg (ref 26.0–34.0)
MCHC: 32.6 g/dL (ref 30.0–36.0)
MCV: 94.4 fL (ref 78.0–100.0)
PLATELETS: 205 10*3/uL (ref 150–400)
RBC: 3.19 MIL/uL — AB (ref 4.22–5.81)
RDW: 13.9 % (ref 11.5–15.5)
WBC: 10.5 10*3/uL (ref 4.0–10.5)

## 2017-12-18 LAB — BRAIN NATRIURETIC PEPTIDE: B NATRIURETIC PEPTIDE 5: 44.2 pg/mL (ref 0.0–100.0)

## 2017-12-18 LAB — INFLUENZA PANEL BY PCR (TYPE A & B)
INFLAPCR: NEGATIVE
Influenza B By PCR: NEGATIVE

## 2017-12-18 LAB — MRSA PCR SCREENING: MRSA BY PCR: NEGATIVE

## 2017-12-18 LAB — D-DIMER, QUANTITATIVE (NOT AT ARMC): D DIMER QUANT: 11.18 ug{FEU}/mL — AB (ref 0.00–0.50)

## 2017-12-18 LAB — I-STAT TROPONIN, ED: Troponin i, poc: 0 ng/mL (ref 0.00–0.08)

## 2017-12-18 LAB — SEDIMENTATION RATE: SED RATE: 61 mm/h — AB (ref 0–16)

## 2017-12-18 LAB — I-STAT CG4 LACTIC ACID, ED: Lactic Acid, Venous: 1.54 mmol/L (ref 0.5–1.9)

## 2017-12-18 MED ORDER — SODIUM CHLORIDE 0.9 % IV BOLUS
500.0000 mL | Freq: Once | INTRAVENOUS | Status: AC
  Administered 2017-12-18: 500 mL via INTRAVENOUS

## 2017-12-18 MED ORDER — ACETAMINOPHEN 650 MG RE SUPP: 650.0000 mg | Freq: Four times a day (QID) | RECTAL | Status: DC | PRN

## 2017-12-18 MED ORDER — POTASSIUM CHLORIDE CRYS ER 20 MEQ PO TBCR
20.0000 meq | EXTENDED_RELEASE_TABLET | Freq: Once | ORAL | Status: AC
  Administered 2017-12-18: 20 meq via ORAL
  Filled 2017-12-18: qty 1

## 2017-12-18 MED ORDER — SODIUM CHLORIDE 0.9 % IV SOLN
1000.0000 mL | INTRAVENOUS | Status: DC
  Administered 2017-12-18: 1000 mL via INTRAVENOUS

## 2017-12-18 MED ORDER — SODIUM CHLORIDE 0.9 % IV SOLN
INTRAVENOUS | Status: AC
  Administered 2017-12-18 (×2): via INTRAVENOUS

## 2017-12-18 MED ORDER — IOPAMIDOL (ISOVUE-370) INJECTION 76%
INTRAVENOUS | Status: AC
  Filled 2017-12-18: qty 100

## 2017-12-18 MED ORDER — APIXABAN 5 MG PO TABS
10.0000 mg | ORAL_TABLET | Freq: Two times a day (BID) | ORAL | Status: DC
  Administered 2017-12-18 – 2017-12-20 (×4): 10 mg via ORAL
  Filled 2017-12-18 (×4): qty 2

## 2017-12-18 MED ORDER — CYANOCOBALAMIN 1000 MCG/ML IJ SOLN
1000.0000 ug | Freq: Every day | INTRAMUSCULAR | Status: DC
  Administered 2017-12-18 – 2017-12-20 (×3): 1000 ug via INTRAMUSCULAR
  Filled 2017-12-18 (×3): qty 1

## 2017-12-18 MED ORDER — DEXAMETHASONE 4 MG PO TABS
4.0000 mg | ORAL_TABLET | Freq: Two times a day (BID) | ORAL | Status: DC
  Administered 2017-12-18 – 2017-12-19 (×3): 4 mg via ORAL
  Filled 2017-12-18 (×3): qty 1

## 2017-12-18 MED ORDER — SODIUM CHLORIDE 0.9 % IV SOLN
1.0000 g | Freq: Three times a day (TID) | INTRAVENOUS | Status: DC
  Administered 2017-12-18 – 2017-12-20 (×7): 1 g via INTRAVENOUS
  Filled 2017-12-18 (×8): qty 1

## 2017-12-18 MED ORDER — VANCOMYCIN HCL IN DEXTROSE 1-5 GM/200ML-% IV SOLN
1000.0000 mg | Freq: Two times a day (BID) | INTRAVENOUS | Status: DC
  Filled 2017-12-18: qty 200

## 2017-12-18 MED ORDER — LEVETIRACETAM 500 MG PO TABS
1000.0000 mg | ORAL_TABLET | Freq: Two times a day (BID) | ORAL | Status: DC
  Administered 2017-12-18 – 2017-12-20 (×5): 1000 mg via ORAL
  Filled 2017-12-18 (×5): qty 2

## 2017-12-18 MED ORDER — ACETAMINOPHEN 325 MG PO TABS: 650.0000 mg | ORAL_TABLET | Freq: Four times a day (QID) | ORAL | Status: DC | PRN

## 2017-12-18 MED ORDER — VANCOMYCIN HCL IN DEXTROSE 1-5 GM/200ML-% IV SOLN
1000.0000 mg | Freq: Once | INTRAVENOUS | Status: AC
  Administered 2017-12-18: 1000 mg via INTRAVENOUS
  Filled 2017-12-18: qty 200

## 2017-12-18 MED ORDER — IOPAMIDOL (ISOVUE-370) INJECTION 76%
100.0000 mL | Freq: Once | INTRAVENOUS | Status: AC | PRN
  Administered 2017-12-18: 80 mL via INTRAVENOUS

## 2017-12-18 MED ORDER — VANCOMYCIN HCL IN DEXTROSE 1-5 GM/200ML-% IV SOLN
1000.0000 mg | Freq: Two times a day (BID) | INTRAVENOUS | Status: DC
  Administered 2017-12-18 (×2): 1000 mg via INTRAVENOUS
  Filled 2017-12-18: qty 200

## 2017-12-18 MED ORDER — SODIUM CHLORIDE 0.9 % IV SOLN
2.0000 g | Freq: Once | INTRAVENOUS | Status: AC
  Administered 2017-12-18: 2 g via INTRAVENOUS
  Filled 2017-12-18: qty 2

## 2017-12-18 MED ORDER — APIXABAN 5 MG PO TABS: 5.0000 mg | ORAL_TABLET | Freq: Two times a day (BID) | ORAL | Status: DC

## 2017-12-18 NOTE — ED Provider Notes (Signed)
[name] DEPT Provider Note  CSN: [id number] Arrival date & time: 12/18/17 0057  Chief Complaint(s) Shortness of Breath   Triage Note 0106 Pt arriving via EMS from home with complaint of shortness of breath. Pt currently on 3L O2 via nasal canula, does not wear oxygen at home. Pt A&O x4. Lungs clear. Cancer pt, last treatment was today. No signs of distress a this time. No medications given prior to arrival   HPI Christopher Morris is a 77 y.o. male   The history is provided by the patient.   Remainder of history, ROS, and physical exam limited due to patient's condition (AMS). Additional information was obtained from EMS.   Unable to reach family.  Level V Caveat.    Past Medical History Past Medical History:  Diagnosis Date  . Brain cancer ([place])   . Glioblastoma ([place]) 09/25/2017  . Hyperglycemia 09/25/2017  . Seizure ([place]) 09/25/2017   Patient Active Problem List   Diagnosis Date Noted  . Vitamin B12 deficiency 11/14/2017  . Anemia 11/14/2017  . Fever 11/13/2017  . Falls 11/13/2017  . Dehydration 11/13/2017  . Interstitial pneumonia ([place]) 11/11/2017  . Sepsis ([place]) 11/09/2017  . Bacteremia due to Gram-negative bacteria 10/26/2017  . Sepsis due to Escherichia coli (E. coli) ([place]) 10/26/2017  . UTI (urinary tract infection) 10/25/2017  . Seizure ([place]) 09/25/2017  . Frontal glioblastoma multiforme ([place]) 09/25/2017  . Hyperglycemia 09/25/2017  . BLOOD IN STOOL 07/26/2008  . ERECTILE DYSFUNCTION 07/12/2008   Home Medication(s) Prior to Admission medications   Medication Sig Start Date End Date Taking? Authorizing Provider  cyanocobalamin (,VITAMIN B-12,) 1000 MCG/ML injection Inject 1 mL (1,000 mcg total) into the muscle daily. Take Vitamin B12 104mcg IM daily x 5 days, then vitamin B12 1054mcg IM weekly, then vitamin B12 1050mcg monthly. 11/17/17  Yes Eugenie Filler, MD  levETIRAcetam (KEPPRA) 1000 MG tablet Take 1 tablet (1,000 mg  total) by mouth 2 (two) times daily. 11/26/17  Yes Vaslow, Acey Lav, MD  acetaminophen (TYLENOL) 325 MG tablet Take 2 tablets (650 mg total) by mouth every 6 (six) hours as needed for mild pain (or Fever >/= 101). Patient not taking: Reported on 12/05/2017 10/04/17   Velvet Bathe, MD  dexamethasone (DECADRON) 4 MG tablet Take 1 tablet (4 mg total) by mouth 2 (two) times daily. Patient not taking: Reported on 12/05/2017 10/17/17   Ventura Sellers, MD  feeding supplement, ENSURE ENLIVE, (ENSURE ENLIVE) LIQD Take 237 mLs by mouth 2 (two) times daily between meals. Patient not taking: Reported on 12/05/2017 11/12/17   Hosie Poisson, MD  ondansetron (ZOFRAN) 8 MG tablet Take 1 tablet (8 mg total) by mouth 2 (two) times daily as needed. Start on the third day after chemotherapy. Patient not taking: Reported on 11/21/2017 11/07/17   Ventura Sellers, MD  polyethylene glycol [facility] / Floria Raveling) packet Take 17 g by mouth daily. Patient not taking: Reported on 11/21/2017 11/17/17   Eugenie Filler, MD  senna-docusate (SENOKOT-S) 8.6-50 MG tablet Take 1 tablet by mouth 2 (two) times daily. Patient not taking: Reported on 11/21/2017 11/16/17   Eugenie Filler, MD  SYRINGE-NEEDLE, DISP, 5 ML (SAFETY SYRINGES/NEEDLE) 21G X 1-1/2" 5 ML MISC 1,000 mcg by Does not apply route daily. Patient not taking: Reported on 12/05/2017 11/16/17   Eugenie Filler, MD  Past Surgical History Past Surgical History:  Procedure Laterality Date  . APPLICATION OF CRANIAL NAVIGATION N/A 09/30/2017   Procedure: APPLICATION OF CRANIAL NAVIGATION;  Surgeon: [name], MD;  Location: [place];  Service: Neurosurgery;  Laterality: N/A;  . CRANIOTOMY N/A 09/30/2017   Procedure: CRANIOTOMY FOR RESECTION OF BRAIN TUMOR WITH STEREOTACTIC;  Surgeon: [name], MD;  Location: [place];  Service: Neurosurgery;   Laterality: N/A;  CRANIOTOMY FOR RESECTION OF BRAIN TUMOR WITH STEREOTACTIC   Family History Family History  Problem Relation Age of Onset  . Hypertension Daughter   . Stroke Mother   . Cancer Neg Hx     Social History Social History   Tobacco Use  . Smoking status: Never Smoker  . Smokeless tobacco: Never Used  Substance Use Topics  . Alcohol use: No    Frequency: Never  . Drug use: No   Allergies Pork-derived products and Shellfish allergy  Review of Systems Review of Systems  Unable to perform ROS: Mental status change  Endocrine: Positive for heat intolerance.    Physical Exam Vital Signs  I have reviewed the triage vital signs BP (!) 146/76 (BP Location: Left Arm)   Pulse (!) 110   Temp (!) 101 F (38.3 C) (Oral)   Resp (!) 23   Ht 6\' 4"  (1.93 m)   Wt 85.3 kg (188 lb)   SpO2 96%   BMI 22.88 kg/m   Physical Exam  Constitutional: He appears well-developed and well-nourished. No distress.  HENT:  Head: Normocephalic and atraumatic.  Nose: Nose normal.  Eyes: Pupils are equal, round, and reactive to light. Conjunctivae and EOM are normal. Right eye exhibits no discharge. Left eye exhibits no discharge. No scleral icterus.  Neck: Normal range of motion. Neck supple.  Cardiovascular: Normal rate and regular rhythm. Exam reveals no gallop and no friction rub.  No murmur heard. Pulmonary/Chest: Effort normal. No stridor. No respiratory distress. He has rales in the right lower field and the left lower field.  Abdominal: Soft. He exhibits no distension. There is no tenderness.  Musculoskeletal: He exhibits no edema or tenderness.  Neurological: He is alert. He is disoriented (oriented to person and place only).  Skin: Skin is warm and dry. No rash noted. He is not diaphoretic. No erythema.  Psychiatric: He has a normal mood and affect.  Vitals reviewed.   ED Results and Treatments Labs (all labs ordered are listed, but only abnormal results are  displayed) Labs Reviewed  CBC WITH DIFFERENTIAL/PLATELET - Abnormal; Notable for the following components:      Result Value   WBC 11.4 (*)    RBC 3.27 (*)    Hemoglobin 10.0 (*)    HCT 31.0 (*)    Neutro Abs 8.0 (*)    All other components within normal limits  COMPREHENSIVE METABOLIC PANEL - Abnormal; Notable for the following components:   Potassium 3.4 (*)    Glucose, Bld 138 (*)    ALT 13 (*)    All other components within normal limits  CULTURE, BLOOD (ROUTINE X 2)  CULTURE, BLOOD (ROUTINE X 2)  URINE CULTURE  RESPIRATORY PANEL BY PCR  URINALYSIS, ROUTINE W REFLEX MICROSCOPIC  BRAIN NATRIURETIC PEPTIDE  INFLUENZA PANEL BY PCR (TYPE A & B)  I-STAT CG4 LACTIC ACID, ED  I-STAT TROPONIN, ED  EKG  EKG Interpretation  Date/Time:  Wednesday December 18 2017 01:19:28 EDT Ventricular Rate:  108 PR Interval:    QRS Duration: 76 QT Interval:  314 QTC Calculation: 421 R Axis:   8 Text Interpretation:  Sinus tachycardia Abnormal R-wave progression, early transition Left ventricular hypertrophy No significant change since last tracing Confirmed by Addison Lank [phone]) on 12/18/2017 2:02:15 AM      Radiology Dg Chest 2 View  Result Date: 12/18/2017 CLINICAL DATA:  Acute onset of shortness of breath. EXAM: CHEST - 2 VIEW COMPARISON:  Chest radiograph performed 11/13/2017 FINDINGS: The lungs are hypoexpanded. Mild bibasilar atelectasis is noted. No definite pleural effusion or pneumothorax is seen. The heart is mildly enlarged. No acute osseous abnormalities are seen. IMPRESSION: Lungs hypoexpanded, with mild bibasilar atelectasis. Mild cardiomegaly. Electronically Signed   By: Garald Balding M.D.   On: 12/18/2017 02:28   Pertinent labs & imaging results that were available during my care of the patient were reviewed by me and considered in my medical decision making (see  chart for details).  Medications Ordered in ED Medications  0.9 %  sodium chloride infusion (1,000 mLs Intravenous New Bag/Given 12/18/17 0152)  vancomycin (VANCOCIN) IVPB 1000 mg/200 mL premix (0 mg Intravenous Stopped 12/18/17 0330)  ceFEPIme (MAXIPIME) 2 g in sodium chloride 0.9 % 100 mL IVPB (0 g Intravenous Stopped 12/18/17 0226)  sodium chloride 0.9 % bolus 500 mL (500 mLs Intravenous New Bag/Given 12/18/17 0252)                                                                                                                                    Procedures Procedures CRITICAL CARE Performed by: Grayce Sessions Chrisie Jankovich Total critical care time: 30 minutes Critical care time was exclusive of separately billable procedures and treating other patients. Critical care was necessary to treat or prevent imminent or life-threatening deterioration. Critical care was time spent personally by me on the following activities: development of treatment plan with patient and/or surrogate as well as nursing, discussions with consultants, evaluation of patient's response to treatment, examination of patient, obtaining history from patient or surrogate, ordering and performing treatments and interventions, ordering and review of laboratory studies, ordering and review of radiographic studies, pulse oximetry and re-evaluation of patient's condition.   (including critical care time)  Medical Decision Making / ED Course I have reviewed the nursing notes for this encounter and the patient's prior records (if available in EHR or on provided paperwork).      Per EMS patient was satting in the low 90s and was placed on 2 L nasal cannula.  Here he is satting 95 to 96% on room air.  Patient noted to be febrile with mild tachycardia.  Normotensive.  Unsure patient's baseline mental status.  Code sepsis initiated and patient started on empiric antibiotics.  IV fluids given but he does not require 30 cc/kg of IV fluids giving  his blood pressures and negative lactic acid.  Chest x-ray without evidence of pneumonia.  UA without evidence of infection.  CBC with mild leukocytosis.  Influenza and viral panel ordered.   Case discussed with medicine for admission and further work-up.  Final Clinical Impression(s) / ED Diagnoses Final diagnoses:  Fever in other diseases  Tachycardia  Bandemia      This chart was dictated using voice recognition software.  Despite best efforts to proofread,  errors can occur which can change the documentation meaning.   Fatima Blank, MD 12/18/17 [phone]

## 2017-12-18 NOTE — H&P (Signed)
TRH H&P   Patient Demographics:    Christopher Morris, is a 77 y.o. male  MRN: [id number]   DOB - 17-Dec-1940  Admit Date - 12/18/2017  Outpatient Primary MD for the patient is Patient, No Pcp Per  Referring MD/NP/PA:  Addison Lank  Outpatient Specialists:  [facility] Kin  Patient coming from: home  Chief Complaint  Patient presents with  . Shortness of Breath      HPI:    Christopher Morris  is a 77 y.o. male, w GBM, s/p craniotomy 09/30/2017, s/p XRT, seizures, vitamin b12 deficiency, Anemia, recent admission for fever and Hcap 11/16/2017 apparently presents w c/o dyspnea per EMS.   In ED,  CXR IMPRESSION: Lungs hypoexpanded, with mild bibasilar atelectasis. Mild cardiomegaly.  Wbc 11.4, Hgb 10.0, Plt 231 BNP 44.2  Na 139, K 3.4 Bun 8, Creatinine 0.82 Ast 15, Alt 13 Trop 0.00  Urinalysis negative  Pt will be admitted for ? dyspnea   Review of systems:    In addition to the HPI above,   No Headache, No changes with Vision or hearing, No problems swallowing food or Liquids, No Chest pain, No Cough  No Abdominal pain, No Nausea or Vommitting, Bowel movements are regular, No Blood in stool or Urine, No dysuria, No new skin rashes or bruises, No new joints pains-aches,  No new weakness, tingling, numbness in any extremity, No recent weight gain or loss, No polyuria, polydypsia or polyphagia, No significant Mental Stressors.  A full 10 point Review of Systems was done, except as stated above, all other Review of Systems were negative.   With Past History of the following :    Past Medical History:  Diagnosis Date  . Brain cancer ([place])   . Glioblastoma ([place]) 09/25/2017  . Hyperglycemia 09/25/2017  . Seizure ([place]) 09/25/2017      Past Surgical History:  Procedure Laterality Date  . APPLICATION OF CRANIAL NAVIGATION N/A 09/30/2017   Procedure: APPLICATION  OF CRANIAL NAVIGATION;  Surgeon: [name], MD;  Location: [place];  Service: Neurosurgery;  Laterality: N/A;  . CRANIOTOMY N/A 09/30/2017   Procedure: CRANIOTOMY FOR RESECTION OF BRAIN TUMOR WITH STEREOTACTIC;  Surgeon: [name], MD;  Location: [place];  Service: Neurosurgery;  Laterality: N/A;  CRANIOTOMY FOR RESECTION OF BRAIN TUMOR WITH STEREOTACTIC      Social History:     Social History   Tobacco Use  . Smoking status: Never Smoker  . Smokeless tobacco: Never Used  Substance Use Topics  . Alcohol use: No    Frequency: Never     Lives - at home  Mobility - walks by self   Family History :     Family History  Problem Relation Age of Onset  . Hypertension Daughter   . Stroke Mother   . Cancer Neg Hx       Home Medications:   Prior to  Admission medications   Medication Sig Start Date End Date Taking? Authorizing Provider  cyanocobalamin (,VITAMIN B-12,) 1000 MCG/ML injection Inject 1 mL (1,000 mcg total) into the muscle daily. Take Vitamin B12 1045mg IM daily x 5 days, then vitamin B12 10091m IM weekly, then vitamin B12 100031mmonthly. 11/17/17  Yes ThoEugenie FillerD  levETIRAcetam (KEPPRA) 1000 MG tablet Take 1 tablet (1,000 mg total) by mouth 2 (two) times daily. 11/26/17  Yes Vaslow, ZacAcey LavD  acetaminophen (TYLENOL) 325 MG tablet Take 2 tablets (650 mg total) by mouth every 6 (six) hours as needed for mild pain (or Fever >/= 101). Patient not taking: Reported on 12/05/2017 10/04/17   VegVelvet BatheD  dexamethasone (DECADRON) 4 MG tablet Take 1 tablet (4 mg total) by mouth 2 (two) times daily. Patient not taking: Reported on 12/05/2017 10/17/17   VasVentura SellersD  feeding supplement, ENSURE ENLIVE, (ENSURE ENLIVE) LIQD Take 237 mLs by mouth 2 (two) times daily between meals. Patient not taking: Reported on 12/05/2017 11/12/17   AkuHosie PoissonD  ondansetron (ZOFRAN) 8 MG tablet Take 1 tablet (8 mg total) by mouth 2 (two) times daily as needed.  Start on the third day after chemotherapy. Patient not taking: Reported on 11/21/2017 11/07/17   VasVentura SellersD  polyethylene glycol (MIUpmc JamesonGLYFloria Ravelingacket Take 17 g by mouth daily. Patient not taking: Reported on 11/21/2017 11/17/17   ThoEugenie FillerD  senna-docusate (SENOKOT-S) 8.6-50 MG tablet Take 1 tablet by mouth 2 (two) times daily. Patient not taking: Reported on 11/21/2017 11/16/17   ThoEugenie FillerD  SYRINGE-NEEDLE, DISP, 5 ML (SAFETY SYRINGES/NEEDLE) 21G X 1-1/2" 5 ML MISC 1,000 mcg by Does not apply route daily. Patient not taking: Reported on 12/05/2017 11/16/17   ThoEugenie FillerD     Allergies:     Allergies  Allergen Reactions  . Pork-Derived Products     Patient does not eat pork  . Shellfish Allergy     Patient does not eat shellfish     Physical Exam:   Vitals  Blood pressure (!) 154/81, pulse 99, temperature (!) 101 F (38.3 C), temperature source Oral, resp. rate (!) 27, height _0  (1.93 m), weight 85.3 kg (188 lb), SpO2 95 %.   1. General  lying in bed in NAD,    2. Normal affect and insight, Not Suicidal or Homicidal, Awake Alert, Oriented X 3.  3. No F.N deficits, ALL C.Nerves Intact, Strength 5/5 all 4 extremities, Sensation intact all 4 extremities, Plantars down going.  4. Ears and Eyes appear Normal, Conjunctivae clear, PERRLA. Moist Oral Mucosa.  5. Supple Neck, No JVD, No cervical lymphadenopathy appriciated, No Carotid Bruits. Negative kernig, negative brudzinski  6. Symmetrical Chest wall movement, Good air movement bilaterally, CTAB.  7. RRR, No Gallops, Rubs or Murmurs, No Parasternal Heave.  8. Positive Bowel Sounds, Abdomen Soft, No tenderness, No organomegaly appriciated,No rebound -guarding or rigidity.  9.  No Cyanosis, Normal Skin Turgor, No Skin Rash or Bruise.  10. Good muscle tone,  joints appear normal , no effusions, Normal ROM.  11. No Palpable Lymph Nodes in Neck or Axillae     Data Review:     CBC Recent Labs  Lab 12/18/17 0117  WBC 11.4*  HGB 10.0*  HCT 31.0*  PLT 231  MCV 94.8  MCH 30.6  MCHC 32.3  RDW 13.8  LYMPHSABS 2.1  MONOABS 0.9  EOSABS 0.4  BASOSABS 0.0   ------------------------------------------------------------------------------------------------------------------  Chemistries  Recent Labs  Lab 12/18/17 0131  NA 139  K 3.4*  CL 104  CO2 26  GLUCOSE 138*  BUN 8  CREATININE 0.82  CALCIUM 9.2  AST 15  ALT 13*  ALKPHOS 95  BILITOT 0.8   ------------------------------------------------------------------------------------------------------------------ estimated creatinine clearance is 88.1 mL/min (by C-G formula based on SCr of 0.82 mg/dL). ------------------------------------------------------------------------------------------------------------------ No results for input(s): TSH, T4TOTAL, T3FREE, THYROIDAB in the last 72 hours.  Invalid input(s): FREET3  Coagulation profile No results for input(s): INR, PROTIME in the last 168 hours. ------------------------------------------------------------------------------------------------------------------- No results for input(s): DDIMER in the last 72 hours. -------------------------------------------------------------------------------------------------------------------  Cardiac Enzymes No results for input(s): CKMB, TROPONINI, MYOGLOBIN in the last 168 hours.  Invalid input(s): CK ------------------------------------------------------------------------------------------------------------------    Component Value Date/Time   BNP 44.2 12/18/2017 0117     ---------------------------------------------------------------------------------------------------------------  Urinalysis    Component Value Date/Time   COLORURINE YELLOW 12/18/2017 0227   APPEARANCEUR CLEAR 12/18/2017 0227   LABSPEC 1.012 12/18/2017 0227   PHURINE 5.0 12/18/2017 0227   GLUCOSEU NEGATIVE 12/18/2017 0227   HGBUR  NEGATIVE 12/18/2017 0227   BILIRUBINUR NEGATIVE 12/18/2017 0227   KETONESUR NEGATIVE 12/18/2017 0227   PROTEINUR NEGATIVE 12/18/2017 0227   NITRITE NEGATIVE 12/18/2017 0227   LEUKOCYTESUR NEGATIVE 12/18/2017 0227    ----------------------------------------------------------------------------------------------------------------   Imaging Results:    Dg Chest 2 View  Result Date: 12/18/2017 CLINICAL DATA:  Acute onset of shortness of breath. EXAM: CHEST - 2 VIEW COMPARISON:  Chest radiograph performed 11/13/2017 FINDINGS: The lungs are hypoexpanded. Mild bibasilar atelectasis is noted. No definite pleural effusion or pneumothorax is seen. The heart is mildly enlarged. No acute osseous abnormalities are seen. IMPRESSION: Lungs hypoexpanded, with mild bibasilar atelectasis. Mild cardiomegaly. Electronically Signed   By: Garald Balding M.D.   On: 12/18/2017 02:28       Assessment & Plan:    Active Problems:   Sepsis ([place])    Fever ? Sepsis (fever, tachycardia, leukocytosis) Blood culture x2 Check ESR Vanco, Cefepime iv pharmacy to dose  Tachycardia Tele Trop I q6h x3 Check cardiac echo Check D dimer, if positive then CTA chest r/o PE  Abdominal discomfort Xray abdomen  Hypokalemia Replete Check cmp in am  Anemia, b12 deficiency Check cbc in am  GBM/ seizure do Cont Keppra 1032m po bid' Cont decadron  DVT Prophylaxis Lovenox - SCDs   AM Labs Ordered, also please review Full Orders  Family Communication: Admission, patients condition and plan of care including tests being ordered have been discussed with the patient  who indicate understanding and agree with the plan and Code Status.  Code Status FULL CODE  Likely DC to  home  Condition GUARDED    Consults called: none  Admission status: inpatient   Time spent in minutes : 45   JJani GravelM.D on 12/18/2017 at 4:25 AM  Between 7am to 7pm - Pager - [phone] . After 7pm go to [email or web address] - password  [facility] - Office  [phone]

## 2017-12-18 NOTE — Progress Notes (Signed)
[facility] PLAN OF CARE NOTE Patient: Christopher Morris DEY:[id number]   PCP: Patient, No Pcp Per DOB: 1940-11-12   DOA: 12/18/2017   DOS: 12/18/2017    Patient was admitted by my colleague Dr. Maudie Mercury earlier on 12/18/2017. I have reviewed the H&P as well as assessment and plan and agree with the same. Important changes in the plan are listed below.  Plan of care: Active Problems:   Sepsis ([place]) Continue cefepime and vancomycin.  Acute macular papular rash involving torso. Monitor for now.  Acute PE DVT. CT scan of the chest as well as Doppler of the leg is positive for DVT. Discussed with Dr. Mickeal Skinner from neuro oncology. Recommend to start the patient on oral anticoagulation. Start on Eliquis.  Author: Berle Mull, MD [facility] Pager: [phone] 12/18/2017 6:43 PM   If 7PM-7AM, please contact night-coverage at [email or web address], password [facility]

## 2017-12-18 NOTE — Progress Notes (Signed)
  Echocardiogram 2D Echocardiogram has been performed.  Christopher Morris 12/18/2017, 2:02 PM

## 2017-12-18 NOTE — Progress Notes (Signed)
ANTICOAGULATION CONSULT NOTE - Initial Consult  Pharmacy Consult for Eliquis Indication: DVT  Allergies  Allergen Reactions  . Pork-Derived Products     Patient does not eat pork  . Shellfish Allergy     Patient does not eat shellfish    Patient Measurements: Height: 6\' 4"  (193 cm) Weight: 193 lb 9 oz (87.8 kg) IBW/kg (Calculated) : 86.8  Vital Signs: Temp: 99.7 F (37.6 C) (06/05 1422) Temp Source: Oral (06/05 1422) BP: 133/81 (06/05 1422) Pulse Rate: 96 (06/05 1422)  Labs: Recent Labs    12/18/17 0117 12/18/17 0131 12/18/17 0658 12/18/17 0659 12/18/17 1051  HGB 10.0*  --   --  9.8*  --   HCT 31.0*  --   --  30.1*  --   PLT 231  --   --  205  --   CREATININE  --  0.82  --  0.76  --   TROPONINI  --   --  <0.03  --  <0.03    Estimated Creatinine Clearance: 91.9 mL/min (by C-G formula based on SCr of 0.76 mg/dL).   Medical History: Past Medical History:  Diagnosis Date  . Brain cancer ([place])   . Glioblastoma ([place]) 09/25/2017  . Hyperglycemia 09/25/2017  . Seizure ([place]) 09/25/2017    Medications:  Scheduled:  . cyanocobalamin  1,000 mcg Intramuscular Daily  . dexamethasone  4 mg Oral BID  . iopamidol      . levETIRAcetam  1,000 mg Oral BID   Infusions:  . sodium chloride 75 mL/hr at 12/18/17 1205  . ceFEPime (MAXIPIME) IV 1 g (12/18/17 1332)  . vancomycin Stopped (12/18/17 1318)   PRN: acetaminophen **OR** acetaminophen  Assessment: 77 yo male admitted with shortness of breath found to have (+) DVT   Goal of Therapy:  Therapeutic anticoagulation   Plan:  Eliquis 10mg  PO bid x 7 days, then 5mg  PO bid Provide education and coupon voucher  prior to discharge  Peggyann Juba, PharmD, BCPS Pager: [phone] 12/18/2017,2:50 PM

## 2017-12-18 NOTE — ED Notes (Signed)
ED TO INPATIENT HANDOFF REPORT  Name/Age/Gender Christopher Morris 77 y.o. male  Code Status Code Status History    Date Active Date Inactive Code Status Order ID Comments User Context   11/13/2017 1809 11/16/2017 1512 Full Code [id number]  Eugenie Filler, MD Inpatient   11/09/2017 1744 11/12/2017 1751 Full Code [id number]  Velvet Bathe, MD ED   10/25/2017 0916 10/28/2017 1903 Full Code [id number]  Elodia Florence., MD ED   09/25/2017 1618 10/04/2017 2106 Full Code [id number]  [name], NP ED      Home/SNF/Other Home  Chief Complaint shortness of breath  Level of Care/Admitting Diagnosis ED Disposition    ED Disposition Condition [facility] Area: [facility] [[id number]]  Level of Care: Telemetry [5]  Admit to tele based on following criteria: Monitor for Ischemic changes  Diagnosis: Sepsis [facility]) [[id number]]  Admitting Physician: Jani Gravel [[id number]]  Attending Physician: Jani Gravel [phone]  Estimated length of stay: past midnight tomorrow  Certification:: I certify this patient will need inpatient services for at least 2 midnights  PT Class (Do Not Modify): Inpatient [101]  PT Acc Code (Do Not Modify): Private [1]       Medical History Past Medical History:  Diagnosis Date  . Brain cancer ([place])   . Glioblastoma ([place]) 09/25/2017  . Hyperglycemia 09/25/2017  . Seizure ([place]) 09/25/2017    Allergies Allergies  Allergen Reactions  . Pork-Derived Products     Patient does not eat pork  . Shellfish Allergy     Patient does not eat shellfish    IV Location/Drains/Wounds Patient Lines/Drains/Airways Status   Active Line/Drains/Airways    Name:   Placement date:   Placement time:   Site:   Days:   Peripheral IV 12/18/17 Right Antecubital   12/18/17    0125    Antecubital   less than 1   Peripheral IV 12/18/17 Left Hand   12/18/17    0145    Hand   less than 1   External Urinary Catheter   11/13/17    1815    -   35           Labs/Imaging Results for orders placed or performed during the hospital encounter of 12/18/17 (from the past 48 hour(s))  CBC with Differential     Status: Abnormal   Collection Time: 12/18/17  1:17 AM  Result Value Ref Range   WBC 11.4 (H) 4.0 - 10.5 K/uL   RBC 3.27 (L) 4.22 - 5.81 MIL/uL   Hemoglobin 10.0 (L) 13.0 - 17.0 g/dL   HCT 31.0 (L) 39.0 - 52.0 %   MCV 94.8 78.0 - 100.0 fL   MCH 30.6 26.0 - 34.0 pg   MCHC 32.3 30.0 - 36.0 g/dL   RDW 13.8 11.5 - 15.5 %   Platelets 231 150 - 400 K/uL   Neutrophils Relative % 70 %   Neutro Abs 8.0 (H) 1.7 - 7.7 K/uL   Lymphocytes Relative 18 %   Lymphs Abs 2.1 0.7 - 4.0 K/uL   Monocytes Relative 8 %   Monocytes Absolute 0.9 0.1 - 1.0 K/uL   Eosinophils Relative 4 %   Eosinophils Absolute 0.4 0.0 - 0.7 K/uL   Basophils Relative 0 %   Basophils Absolute 0.0 0.0 - 0.1 K/uL    Comment: Performed at [facility], [place] [address] [place], [place] [address]  Brain natriuretic peptide     Status: None  Collection Time: 12/18/17  1:17 AM  Result Value Ref Range   B Natriuretic Peptide 44.2 0.0 - 100.0 pg/mL    Comment: Performed at [facility], [place] [address], [place], [place] [address]  Comprehensive metabolic panel     Status: Abnormal   Collection Time: 12/18/17  1:31 AM  Result Value Ref Range   Sodium 139 135 - 145 mmol/L   Potassium 3.4 (L) 3.5 - 5.1 mmol/L   Chloride 104 101 - 111 mmol/L   CO2 26 22 - 32 mmol/L   Glucose, Bld 138 (H) 65 - 99 mg/dL   BUN 8 6 - 20 mg/dL   Creatinine, Ser 0.82 0.61 - 1.24 mg/dL   Calcium 9.2 8.9 - 10.3 mg/dL   Total Protein 7.3 6.5 - 8.1 g/dL   Albumin 4.0 3.5 - 5.0 g/dL   AST 15 15 - 41 U/L   ALT 13 (L) 17 - 63 U/L   Alkaline Phosphatase 95 38 - 126 U/L   Total Bilirubin 0.8 0.3 - 1.2 mg/dL   GFR calc non Af Amer >60 >60 mL/min   GFR calc Af Amer >60 >60 mL/min    Comment: (NOTE) The eGFR has been calculated using the CKD EPI equation. This  calculation has not been validated in all clinical situations. eGFR's persistently <60 mL/min signify possible Chronic Kidney Disease.    Anion gap 9 5 - 15    Comment: Performed at [facility], [place] [address], [place], [place] [address]  I-Stat CG4 Lactic Acid, ED     Status: None   Collection Time: 12/18/17  1:53 AM  Result Value Ref Range   Lactic Acid, Venous 1.54 0.5 - 1.9 mmol/L  Urinalysis, Routine w reflex microscopic     Status: None   Collection Time: 12/18/17  2:27 AM  Result Value Ref Range   Color, Urine YELLOW YELLOW   APPearance CLEAR CLEAR   Specific Gravity, Urine 1.012 1.005 - 1.030   pH 5.0 5.0 - 8.0   Glucose, UA NEGATIVE NEGATIVE mg/dL   Hgb urine dipstick NEGATIVE NEGATIVE   Bilirubin Urine NEGATIVE NEGATIVE   Ketones, ur NEGATIVE NEGATIVE mg/dL   Protein, ur NEGATIVE NEGATIVE mg/dL   Nitrite NEGATIVE NEGATIVE   Leukocytes, UA NEGATIVE NEGATIVE    Comment: Performed at [facility] [address] [place], [place] [address]  I-Stat Troponin, ED (not at [facility])     Status: None   Collection Time: 12/18/17  2:58 AM  Result Value Ref Range   Troponin i, poc 0.00 0.00 - 0.08 ng/mL   Comment 3            Comment: Due to the release kinetics of cTnI, a negative result within the first hours of the onset of symptoms does not rule out myocardial infarction with certainty. If myocardial infarction is still suspected, repeat the test at appropriate intervals.    Dg Chest 2 View  Result Date: 12/18/2017 CLINICAL DATA:  Acute onset of shortness of breath. EXAM: CHEST - 2 VIEW COMPARISON:  Chest radiograph performed 11/13/2017 FINDINGS: The lungs are hypoexpanded. Mild bibasilar atelectasis is noted. No definite pleural effusion or pneumothorax is seen. The heart is mildly enlarged. No acute osseous abnormalities are seen. IMPRESSION: Lungs hypoexpanded, with mild bibasilar atelectasis. Mild cardiomegaly. Electronically Signed   By:  Garald Balding M.D.   On: 12/18/2017 02:28    Pending Labs Unresulted Labs (From admission, onward)   Start  Ordered   12/18/17 0343  Respiratory Panel by PCR  (Respiratory virus panel)  Once,   R     12/18/17 0342   12/18/17 0343  Influenza panel by PCR (type A & B)  (Influenza PCR Panel)  Once,   R     12/18/17 0342   12/18/17 0131  Blood Culture (routine x 2)  BLOOD CULTURE X 2,   STAT    Question:  Patient immune status  Answer:  Immunocompromised   12/18/17 0131   12/18/17 0131  Urine culture  STAT,   STAT    Question:  Patient immune status  Answer:  Immunocompromised   12/18/17 0131      Vitals/Pain Today's Vitals   12/18/17 0130 12/18/17 0225 12/18/17 0230 12/18/17 0245  BP: (!) 150/87 (!) 158/98 (!) 146/99 (!) 145/80  Pulse:  (!) 105 98 (!) 102  Resp: (!) 24 (!) 23 20 (!) 26  Temp:      TempSrc:      SpO2:  93% 95% 93%  Weight:      Height:      PainSc:        Isolation Precautions Droplet precaution  Medications Medications  0.9 %  sodium chloride infusion (1,000 mLs Intravenous New Bag/Given 12/18/17 0152)  vancomycin (VANCOCIN) IVPB 1000 mg/200 mL premix (0 mg Intravenous Stopped 12/18/17 0330)  ceFEPIme (MAXIPIME) 2 g in sodium chloride 0.9 % 100 mL IVPB (0 g Intravenous Stopped 12/18/17 0226)  sodium chloride 0.9 % bolus 500 mL (500 mLs Intravenous New Bag/Given 12/18/17 0252)    Mobility walks

## 2017-12-18 NOTE — Progress Notes (Signed)
Preliminary notes--Bilateral lower extremities venous duplex exam completed. Positive for acute DVT involving left Common Femoral Vein, SFJ, Femoral vein, Profunda vein prox, Popliteal vein, Posterior Tibial veins and Peroneal veins partially. Right SSV acute thrombosed.  Result notified RN [place].   [place] Zaylyn Bergdoll (RDMS RVT) 12/18/17 1:07 PM

## 2017-12-18 NOTE — ED Notes (Signed)
Patient transported to X-ray 

## 2017-12-18 NOTE — ED Triage Notes (Addendum)
Pt arriving via EMS from home with complaint of shortness of breath. Pt currently on 3L O2 via nasal canula, does not wear oxygen at home. Pt A&O to person and place. Pt at neurological baseline per family report. Lungs clear with exception of crackles to lower posterior lobes . Cancer pt, last treatment was today. No signs of distress a this time. No medications given prior to arrival.

## 2017-12-18 NOTE — Progress Notes (Signed)
Pharmacy Antibiotic Note  Christopher Morris is a 77 y.o. male admitted on 12/18/2017 with sepsis.  Pharmacy has been consulted for Vancomycin, cefepime dosing.  Plan:  Cefepime 2gm iv x1, then 1gm iv q8hr  Vancomycin 1gm iv x1, then 1gm iv q12hr  Goal AUC = 400 - 500 for all indications, except meningitis (goal AUC > 500 and Cmin 15-20 mcg/mL)   Height: 6\' 4"  (193 cm) Weight: 188 lb (85.3 kg) IBW/kg (Calculated) : 86.8  Temp (24hrs), Avg:101 F (38.3 C), Min:101 F (38.3 C), Max:101 F (38.3 C)  Recent Labs  Lab 12/18/17 0117 12/18/17 0131 12/18/17 0153  WBC 11.4*  --   --   CREATININE  --  0.82  --   LATICACIDVEN  --   --  1.54    Estimated Creatinine Clearance: 88.1 mL/min (by C-G formula based on SCr of 0.82 mg/dL).    Allergies  Allergen Reactions  . Pork-Derived Products     Patient does not eat pork  . Shellfish Allergy     Patient does not eat shellfish    Antimicrobials this admission: Vancomycin 12/18/2017 >> Cefepime 12/18/2017 >>   Dose adjustments this admission: -  Microbiology results: -  Thank you for allowing pharmacy to be a part of this patient's care.  Nani Skillern Crowford 12/18/2017 4:29 AM

## 2017-12-19 ENCOUNTER — Inpatient Hospital Stay (HOSPITAL_COMMUNITY)
Admit: 2017-12-19 | Discharge: 2017-12-19 | Disposition: A | Discharge status: Home / Self Care | Payer: Medicare Other | Attending: Internal Medicine | Admitting: Internal Medicine

## 2017-12-19 LAB — ECHOCARDIOGRAM COMPLETE
Height: 76 in
WEIGHTICAEL: 3097.02 [oz_av]

## 2017-12-19 LAB — CBC WITH DIFFERENTIAL/PLATELET
BASOS ABS: 0 10*3/uL (ref 0.0–0.1)
Basophils Relative: 0 %
Eosinophils Absolute: 0 10*3/uL (ref 0.0–0.7)
Eosinophils Relative: 0 %
HEMATOCRIT: 26 % — AB (ref 39.0–52.0)
Hemoglobin: 8.6 g/dL — ABNORMAL LOW (ref 13.0–17.0)
LYMPHS PCT: 9 %
Lymphs Abs: 1.1 10*3/uL (ref 0.7–4.0)
MCH: 30.7 pg (ref 26.0–34.0)
MCHC: 33.1 g/dL (ref 30.0–36.0)
MCV: 92.9 fL (ref 78.0–100.0)
MONO ABS: 1.1 10*3/uL — AB (ref 0.1–1.0)
Monocytes Relative: 9 %
NEUTROS ABS: 9.6 10*3/uL — AB (ref 1.7–7.7)
Neutrophils Relative %: 82 %
Platelets: 192 10*3/uL (ref 150–400)
RBC: 2.8 MIL/uL — ABNORMAL LOW (ref 4.22–5.81)
RDW: 13.7 % (ref 11.5–15.5)
WBC: 11.8 10*3/uL — ABNORMAL HIGH (ref 4.0–10.5)

## 2017-12-19 LAB — COMPREHENSIVE METABOLIC PANEL
ALT: 15 U/L — AB (ref 17–63)
AST: 16 U/L (ref 15–41)
Albumin: 3 g/dL — ABNORMAL LOW (ref 3.5–5.0)
Alkaline Phosphatase: 67 U/L (ref 38–126)
Anion gap: 7 (ref 5–15)
BILIRUBIN TOTAL: 0.9 mg/dL (ref 0.3–1.2)
BUN: 10 mg/dL (ref 6–20)
CO2: 24 mmol/L (ref 22–32)
CREATININE: 0.71 mg/dL (ref 0.61–1.24)
Calcium: 8.6 mg/dL — ABNORMAL LOW (ref 8.9–10.3)
Chloride: 109 mmol/L (ref 101–111)
Glucose, Bld: 197 mg/dL — ABNORMAL HIGH (ref 65–99)
Potassium: 3.9 mmol/L (ref 3.5–5.1)
Sodium: 140 mmol/L (ref 135–145)
TOTAL PROTEIN: 6.2 g/dL — AB (ref 6.5–8.1)

## 2017-12-19 LAB — LACTIC ACID, PLASMA: LACTIC ACID, VENOUS: 1.4 mmol/L (ref 0.5–1.9)

## 2017-12-19 LAB — PROTIME-INR
INR: 1.53
Prothrombin Time: 18.3 seconds — ABNORMAL HIGH (ref 11.4–15.2)

## 2017-12-19 LAB — URINE CULTURE: Culture: NO GROWTH

## 2017-12-19 LAB — MAGNESIUM: Magnesium: 1.8 mg/dL (ref 1.7–2.4)

## 2017-12-19 MED ORDER — DEXAMETHASONE 4 MG PO TABS
4.0000 mg | ORAL_TABLET | Freq: Every day | ORAL | Status: DC
  Administered 2017-12-20: 4 mg via ORAL
  Filled 2017-12-19: qty 1

## 2017-12-19 NOTE — Progress Notes (Signed)
[facility] Progress Note  Patient: Christopher Morris ZOX:[id number]   PCP: Patient, No Pcp Per DOB: 01-03-1941   DOA: 12/18/2017   DOS: 12/19/2017   Date of Service: the patient was seen and examined on 12/19/2017  Subjective: Feeling better, no nausea no vomiting no fever no chills.  No chest pain right now.  No itching reported on the rash.  Brief hospital course: Pt. with PMH of GBM, s/p craniotomy 09/30/2017, s/p XRT, seizures, vitamin b12 deficiency, Anemia; admitted on 12/18/2017, presented with complaint of shortness of breath, was found to have acute PE and DVT along with possible healthcare associated pneumonia. Currently further plan is continue IV antibiotics.  Assessment and Plan: 1.  Acute pulmonary embolism. Acute left common femoral vein SF J femoral vein profunda vein popliteal vein posterior tibial vein and peroneal vein DVT. Also right SSV thrombosis. Discussed with neuro oncology, agreeable for antic regulation. Patient will be started on Eliquis. Appears to be tolerating it very well since no acute neurological changes in last 24 hours since the start of the medication. Continue monitoring of H&H. Currently hemodynamically stable without any evidence of hypoxia, hypotension, tachycardia as well as chest pain resolved.  2.  Sepsis. Healthcare associated pneumonia. Presented with fever, tachypnea. Influenza PCR negative, MRSA PCR negative. Blood cultures so far negative as well. Airspace consolidation in the right lower lobe consistent with pneumonia. Start on IV vancomycin and cefepime, currently on IV cefepime. Monitor. We will transition to oral tomorrow.  3.  Recurrent unresponsive episode. Daughter reports that patient in between conversation will give a blank stare. With his history of CNS tumor possibility of seizure cannot be ruled out. Patient is already on Keppra thousand milligrams twice daily. We will get an EEG to rule out any active seizure or not status  epilepticus.  4.  GBM. S/P radiation and steroids.  Continue at home he with resection on 09/30/2017 Neuro oncology considering possible chemotherapy. Outpatient follow-up.  5.  Diffuse macular papular rash. This rash involves torso both front and back. Non-itching.  Nonblanching. Etiology is currently unclear. Patient is responding to steroid as well as IV antibiotic. Patient was started on Decadron yesterday we will taper it off in the next 3 days. Monitor.  6.  Seizure disorder. Secondary to GBM. Completed Decadron therapy recently as an outpatient around Nov 27, 2017. On Keppra 1000 mg twice daily, dose recently increased neuro-oncology. Follow-up on EEG  Diet: Regular diet  Advance goals of care discussion: full code  Family Communication: family was present at bedside, at the time of interview. The pt provided permission to discuss medical plan with the family. Opportunity was given to ask question and all questions were answered satisfactorily.   Disposition:  Discharge to home.  Consultants: none Procedures: Echocardiogram   Antibiotics: Anti-infectives (From admission, onward)   Start     Dose/Rate Route Frequency Ordered Stop   12/18/17 1400  ceFEPIme (MAXIPIME) 1 g in sodium chloride 0.9 % 100 mL IVPB     1 g 200 mL/hr over 30 Minutes Intravenous Every 8 hours 12/18/17 0428     12/18/17 1230  vancomycin (VANCOCIN) IVPB 1000 mg/200 mL premix  Status:  Discontinued     1,000 mg 200 mL/hr over 60 Minutes Intravenous Every 12 hours 12/18/17 1217 12/19/17 0922   12/18/17 1200  vancomycin (VANCOCIN) IVPB 1000 mg/200 mL premix  Status:  Discontinued     1,000 mg 200 mL/hr over 60 Minutes Intravenous Every 12 hours 12/18/17 0428 12/18/17 1203  12/18/17 0145  vancomycin (VANCOCIN) IVPB 1000 mg/200 mL premix     1,000 mg 200 mL/hr over 60 Minutes Intravenous  Once 12/18/17 0131 12/18/17 0330   12/18/17 0145  ceFEPIme (MAXIPIME) 2 g in sodium chloride 0.9 % 100 mL IVPB      2 g 200 mL/hr over 30 Minutes Intravenous  Once 12/18/17 0142 12/18/17 0226       Objective: Physical Exam: Vitals:   12/18/17 1422 12/18/17 2129 12/19/17 0500 12/19/17 1255  BP: 133/81 134/85 130/69 133/78  Pulse: 96 97 87 86  Resp: 20 (!) 50 20 20  Temp: 99.7 F (37.6 C) 98.4 F (36.9 C) 99.2 F (37.3 C) 98.6 F (37 C)  TempSrc: Oral Axillary Oral Oral  SpO2: 97% 100% 97% 94%  Weight:   89.7 kg (197 lb 12 oz)   Height:        Intake/Output Summary (Last 24 hours) at 12/19/2017 1753 Last data filed at 12/19/2017 1256 Gross per 24 hour  Intake 480 ml  Output 975 ml  Net -495 ml   Filed Weights   12/18/17 0110 12/18/17 0553 12/19/17 0500  Weight: 85.3 kg (188 lb) 87.8 kg (193 lb 9 oz) 89.7 kg (197 lb 12 oz)   General: Alert, Awake and Oriented to Time, Place and Person. Appear in mild distress, affect appropriate Eyes: PERRL, Conjunctiva normal ENT: Oral Mucosa clear moist. Neck: no JVD, no Abnormal Mass Or lumps Cardiovascular: S1 and S2 Present, no Murmur, Peripheral Pulses Present Respiratory: normal respiratory effort, Bilateral Air entry equal and Decreased, no use of accessory muscle, Clear to Auscultation, no Crackles, no wheezes Abdomen: Bowel Sound present, Soft and no tenderness, no hernia Skin: no redness, no Rash, no induration Extremities: no Pedal edema, no calf tenderness Neurologic: Grossly no focal neuro deficit. Bilaterally Equal motor strength  Data Reviewed: CBC: Recent Labs  Lab 12/18/17 0117 12/18/17 0659 12/19/17 0438  WBC 11.4* 10.5 11.8*  NEUTROABS 8.0*  --  9.6*  HGB 10.0* 9.8* 8.6*  HCT 31.0* 30.1* 26.0*  MCV 94.8 94.4 92.9  PLT 231 205 354   Basic Metabolic Panel: Recent Labs  Lab 12/18/17 0131 12/18/17 0659 12/19/17 0438  NA 139 140 140  K 3.4* 3.5 3.9  CL 104 106 109  CO2 26 24 24   GLUCOSE 138* 153* 197*  BUN 8 8 10   CREATININE 0.82 0.76 0.71  CALCIUM 9.2 8.6* 8.6*  MG  --   --  1.8    Liver Function  Tests: Recent Labs  Lab 12/18/17 0131 12/18/17 0659 12/19/17 0438  AST 15 19 16   ALT 13* 12* 15*  ALKPHOS 95 79 67  BILITOT 0.8 0.9 0.9  PROT 7.3 6.3* 6.2*  ALBUMIN 4.0 3.3* 3.0*   No results for input(s): LIPASE, AMYLASE in the last 168 hours. No results for input(s): AMMONIA in the last 168 hours. Coagulation Profile: Recent Labs  Lab 12/19/17 0438  INR 1.53   Cardiac Enzymes: Recent Labs  Lab 12/18/17 0658 12/18/17 1051 12/18/17 1630  TROPONINI <0.03 <0.03 <0.03   BNP (last 3 results) No results for input(s): PROBNP in the last 8760 hours. CBG: No results for input(s): GLUCAP in the last 168 hours. Studies: No results found.  Scheduled Meds: . apixaban  10 mg Oral BID   Followed by  . [START ON 12/25/2017] apixaban  5 mg Oral BID  . cyanocobalamin  1,000 mcg Intramuscular Daily  . [START ON 12/20/2017] dexamethasone  4 mg Oral Daily  .  levETIRAcetam  1,000 mg Oral BID   Continuous Infusions: . ceFEPime (MAXIPIME) IV Stopped (12/19/17 1636)   PRN Meds: acetaminophen **OR** acetaminophen  Time spent: 35 minutes  Author: Berle Mull, MD [facility] Pager: [phone] 12/19/2017 5:53 PM  If 7PM-7AM, please contact night-coverage at [email or web address], password [facility]

## 2017-12-19 NOTE — Discharge Instructions (Signed)
Information on my medicine - ELIQUIS (apixaban)   Why was Eliquis prescribed for you? Eliquis was prescribed to treat blood clots that may have been found in the veins of your legs (deep vein thrombosis) or in your lungs (pulmonary embolism) and to reduce the risk of them occurring again.  What do You need to know about Eliquis ? The starting dose is 10 mg (two 5 mg tablets) taken TWICE daily for the FIRST SEVEN (7) DAYS, then on (enter date)  12/25/17  the dose is reduced to ONE 5 mg tablet taken TWICE daily.  Eliquis may be taken with or without food.   Try to take the dose about the same time in the morning and in the evening. If you have difficulty swallowing the tablet whole please discuss with your pharmacist how to take the medication safely.  Take Eliquis exactly as prescribed and DO NOT stop taking Eliquis without talking to the doctor who prescribed the medication.  Stopping may increase your risk of developing a new blood clot.  Refill your prescription before you run out.  After discharge, you should have regular check-up appointments with your healthcare provider that is prescribing your Eliquis.    What do you do if you miss a dose? If a dose of ELIQUIS is not taken at the scheduled time, take it as soon as possible on the same day and twice-daily administration should be resumed. The dose should not be doubled to make up for a missed dose.  Important Safety Information A possible side effect of Eliquis is bleeding. You should call your healthcare provider right away if you experience any of the following: ? Bleeding from an injury or your nose that does not stop. ? Unusual colored urine (red or dark brown) or unusual colored stools (red or black). ? Unusual bruising for unknown reasons. ? A serious fall or if you hit your head (even if there is no bleeding).  Some medicines may interact with Eliquis and might increase your risk of bleeding or clotting while on  Eliquis. To help avoid this, consult your healthcare provider or pharmacist prior to using any new prescription or non-prescription medications, including herbals, vitamins, non-steroidal anti-inflammatory drugs (NSAIDs) and supplements.  This website has more information on Eliquis (apixaban): http://www.eliquis.com/eliquis/home

## 2017-12-19 NOTE — Progress Notes (Signed)
Offsite EEG completed at [facility], results pending. 

## 2017-12-20 MED ORDER — APIXABAN 5 MG PO TABS: ORAL_TABLET | ORAL | 0 refills | Status: DC

## 2017-12-20 MED ORDER — CEFPODOXIME PROXETIL 200 MG PO TABS: 200.0000 mg | ORAL_TABLET | Freq: Two times a day (BID) | ORAL | 0 refills | Status: DC

## 2017-12-20 MED ORDER — AZITHROMYCIN 500 MG PO TABS: 500.0000 mg | ORAL_TABLET | Freq: Every day | ORAL | 0 refills | Status: DC

## 2017-12-20 MED ORDER — DEXAMETHASONE 4 MG PO TABS: 4.0000 mg | ORAL_TABLET | Freq: Every day | ORAL | 0 refills | Status: AC

## 2017-12-20 NOTE — Progress Notes (Signed)
Pt will need to go through the [place] for [facility]. There is no answer with  [facility].  Pt's daughter is aware that pt need to go to his PCP with the [place].

## 2017-12-20 NOTE — Progress Notes (Signed)
Pt is a resumption of care for [facility], Attending made aware and agency notified. Pt was dc. Jonnie Finner RN CCM Case Mgmt phone [phone]

## 2017-12-20 NOTE — Evaluation (Signed)
Physical Therapy Evaluation Patient Details Name: Christopher Morris MRN: [id number] DOB: 1940-12-18 Today's Date: 12/20/2017   History of Present Illness  77 yo male admitted with sepsis, acute PE, DVT. hx of glioblastoma s/p craniotomy 09/2017 and radiation, falls.   Clinical Impression  On eval, pt was Min guard assist for mobility. He walked ~500 feet around the unit. Mildly unsteady at times. Will follow and progress activity as tolerated.     Follow Up Recommendations Supervision for mobility/OOB;Home health PT    Equipment Recommendations  None recommended by PT    Recommendations for Other Services       Precautions / Restrictions Precautions Precautions: Fall Restrictions Weight Bearing Restrictions: No      Mobility  Bed Mobility Overal bed mobility: Modified Independent                Transfers Overall transfer level: Needs assistance   Transfers: Sit to/from Stand Sit to Stand: Min guard         General transfer comment: close guard for safety.   Ambulation/Gait Ambulation/Gait assistance: Min guard Ambulation Distance (Feet): 500 Feet Assistive device: (hallway handrail intermittently) Gait Pattern/deviations: Step-through pattern;Drifts right/left;Staggering left;Staggering right     General Gait Details: close guard for safety. Pt held on to the handrail as needed. O2 sat >90% on RA  Stairs            Wheelchair Mobility    Modified Rankin (Stroke Patients Only)       Balance Overall balance assessment: Needs assistance           Standing balance-Leahy Scale: Fair                               Pertinent Vitals/Pain Pain Assessment: No/denies pain    Home Living Family/patient expects to be discharged to:: Private residence Living Arrangements: Children Available Help at Discharge: Family;Available PRN/intermittently Type of Home: Apartment Home Access: Level entry     Home Layout: One level         Prior Function Level of Independence: Independent               Hand Dominance        Extremity/Trunk Assessment   Upper Extremity Assessment Upper Extremity Assessment: Overall WFL for tasks assessed    Lower Extremity Assessment Lower Extremity Assessment: Generalized weakness    Cervical / Trunk Assessment Cervical / Trunk Assessment: Normal  Communication   Communication: No difficulties  Cognition Arousal/Alertness: Awake/alert Behavior During Therapy: WFL for tasks assessed/performed Overall Cognitive Status: Within Functional Limits for tasks assessed                                        General Comments      Exercises     Assessment/Plan    PT Assessment Patient needs continued PT services  PT Problem List Decreased mobility;Decreased balance       PT Treatment Interventions Balance training;Functional mobility training;Therapeutic exercise;Therapeutic activities;Gait training    PT Goals (Current goals can be found in the Care Plan section)  Acute Rehab PT Goals Patient Stated Goal: home PT Goal Formulation: With patient Time For Goal Achievement: 01/03/18 Potential to Achieve Goals: Good    Frequency Min 3X/week   Barriers to discharge        Co-evaluation  AM-PAC PT "6 Clicks" Daily Activity  Outcome Measure Difficulty turning over in bed (including adjusting bedclothes, sheets and blankets)?: None Difficulty moving from lying on back to sitting on the side of the bed? : None Difficulty sitting down on and standing up from a chair with arms (e.g., wheelchair, bedside commode, etc,.)?: A Little Help needed moving to and from a bed to chair (including a wheelchair)?: A Little Help needed walking in hospital room?: A Little Help needed climbing 3-5 steps with a railing? : A Little 6 Click Score: 20    End of Session Equipment Utilized During Treatment: Gait belt Activity Tolerance: Patient  tolerated treatment well Patient left: in chair;with call bell/phone within reach        Time: 1157-1236 PT Time Calculation (min) (ACUTE ONLY): 39 min   Charges:   PT Evaluation $PT Eval Moderate Complexity: 1 Mod PT Treatments $Gait Training: 23-37 mins   PT G Codes:          Weston Anna, MPT Pager: [phone]

## 2017-12-22 ENCOUNTER — Emergency Department (HOSPITAL_COMMUNITY): Payer: Medicare Other

## 2017-12-22 ENCOUNTER — Encounter (HOSPITAL_COMMUNITY): Payer: Self-pay | Admitting: Emergency Medicine

## 2017-12-22 ENCOUNTER — Other Ambulatory Visit: Payer: Self-pay

## 2017-12-22 ENCOUNTER — Inpatient Hospital Stay (HOSPITAL_COMMUNITY)
Admission: EM | Admit: 2017-12-22 | Discharge: 2017-12-24 | DRG: 176 | Disposition: A | Discharge status: Home Health | Payer: Medicare Other | Attending: Internal Medicine | Admitting: Internal Medicine

## 2017-12-22 DIAGNOSIS — J189 Pneumonia, unspecified organism: Secondary | ICD-10-CM | POA: Diagnosis present

## 2017-12-22 DIAGNOSIS — Z7901 Long term (current) use of anticoagulants: Secondary | ICD-10-CM

## 2017-12-22 DIAGNOSIS — I2699 Other pulmonary embolism without acute cor pulmonale: Secondary | ICD-10-CM | POA: Diagnosis present

## 2017-12-22 DIAGNOSIS — Z923 Personal history of irradiation: Secondary | ICD-10-CM

## 2017-12-22 DIAGNOSIS — Z91018 Allergy to other foods: Secondary | ICD-10-CM | POA: Diagnosis not present

## 2017-12-22 DIAGNOSIS — F0390 Unspecified dementia without behavioral disturbance: Secondary | ICD-10-CM | POA: Diagnosis present

## 2017-12-22 DIAGNOSIS — G40909 Epilepsy, unspecified, not intractable, without status epilepticus: Secondary | ICD-10-CM | POA: Diagnosis present

## 2017-12-22 DIAGNOSIS — R569 Unspecified convulsions: Secondary | ICD-10-CM

## 2017-12-22 DIAGNOSIS — Z823 Family history of stroke: Secondary | ICD-10-CM

## 2017-12-22 DIAGNOSIS — C711 Malignant neoplasm of frontal lobe: Secondary | ICD-10-CM | POA: Diagnosis present

## 2017-12-22 DIAGNOSIS — R0602 Shortness of breath: Secondary | ICD-10-CM | POA: Diagnosis not present

## 2017-12-22 DIAGNOSIS — Z91013 Allergy to seafood: Secondary | ICD-10-CM | POA: Diagnosis not present

## 2017-12-22 DIAGNOSIS — T81718D Complication of other artery following a procedure, not elsewhere classified, subsequent encounter: Secondary | ICD-10-CM

## 2017-12-22 DIAGNOSIS — Z8249 Family history of ischemic heart disease and other diseases of the circulatory system: Secondary | ICD-10-CM | POA: Diagnosis not present

## 2017-12-22 LAB — CBC WITH DIFFERENTIAL/PLATELET
BASOS ABS: 0 10*3/uL (ref 0.0–0.1)
Basophils Relative: 0 %
Eosinophils Absolute: 0.2 10*3/uL (ref 0.0–0.7)
Eosinophils Relative: 2 %
HEMATOCRIT: 28.2 % — AB (ref 39.0–52.0)
HEMOGLOBIN: 9.4 g/dL — AB (ref 13.0–17.0)
LYMPHS PCT: 20 %
Lymphs Abs: 2 10*3/uL (ref 0.7–4.0)
MCH: 30.9 pg (ref 26.0–34.0)
MCHC: 33.3 g/dL (ref 30.0–36.0)
MCV: 92.8 fL (ref 78.0–100.0)
MONO ABS: 1.2 10*3/uL — AB (ref 0.1–1.0)
Monocytes Relative: 12 %
NEUTROS ABS: 6.9 10*3/uL (ref 1.7–7.7)
NEUTROS PCT: 66 %
Platelets: 276 10*3/uL (ref 150–400)
RBC: 3.04 MIL/uL — ABNORMAL LOW (ref 4.22–5.81)
RDW: 13.9 % (ref 11.5–15.5)
WBC: 10.4 10*3/uL (ref 4.0–10.5)

## 2017-12-22 LAB — BASIC METABOLIC PANEL
ANION GAP: 7 (ref 5–15)
BUN: 13 mg/dL (ref 6–20)
CHLORIDE: 103 mmol/L (ref 101–111)
CO2: 28 mmol/L (ref 22–32)
Calcium: 8.6 mg/dL — ABNORMAL LOW (ref 8.9–10.3)
Creatinine, Ser: 0.69 mg/dL (ref 0.61–1.24)
GFR calc Af Amer: >60 mL/min (ref >60)
GFR calc non Af Amer: >60 mL/min (ref >60)
GLUCOSE: 127 mg/dL — AB (ref 65–99)
POTASSIUM: 3.7 mmol/L (ref 3.5–5.1)
Sodium: 138 mmol/L (ref 135–145)

## 2017-12-22 MED ORDER — ACETAMINOPHEN 325 MG PO TABS
650.0000 mg | ORAL_TABLET | Freq: Four times a day (QID) | ORAL | Status: DC | PRN
  Administered 2017-12-22 – 2017-12-23 (×3): 650 mg via ORAL
  Filled 2017-12-22 (×3): qty 2

## 2017-12-22 MED ORDER — SODIUM CHLORIDE 0.9 % IV SOLN
2.0000 g | Freq: Three times a day (TID) | INTRAVENOUS | Status: DC
  Administered 2017-12-22 – 2017-12-23 (×4): 2 g via INTRAVENOUS
  Filled 2017-12-22 (×5): qty 2

## 2017-12-22 MED ORDER — LEVETIRACETAM 500 MG PO TABS
1000.0000 mg | ORAL_TABLET | Freq: Two times a day (BID) | ORAL | Status: DC
  Administered 2017-12-22 – 2017-12-24 (×5): 1000 mg via ORAL
  Filled 2017-12-22 (×5): qty 2

## 2017-12-22 MED ORDER — VANCOMYCIN HCL IN DEXTROSE 750-5 MG/150ML-% IV SOLN
750.0000 mg | Freq: Three times a day (TID) | INTRAVENOUS | Status: DC
  Administered 2017-12-22 – 2017-12-23 (×4): 750 mg via INTRAVENOUS
  Filled 2017-12-22 (×6): qty 150

## 2017-12-22 MED ORDER — IPRATROPIUM-ALBUTEROL 0.5-2.5 (3) MG/3ML IN SOLN
3.0000 mL | Freq: Four times a day (QID) | RESPIRATORY_TRACT | Status: DC | PRN
  Administered 2017-12-22: 3 mL via RESPIRATORY_TRACT
  Filled 2017-12-22: qty 3

## 2017-12-22 MED ORDER — IOPAMIDOL (ISOVUE-370) INJECTION 76%
INTRAVENOUS | Status: AC
  Filled 2017-12-22: qty 100

## 2017-12-22 MED ORDER — IOPAMIDOL (ISOVUE-370) INJECTION 76%
100.0000 mL | Freq: Once | INTRAVENOUS | Status: AC | PRN
  Administered 2017-12-22: 100 mL via INTRAVENOUS

## 2017-12-22 MED ORDER — SODIUM CHLORIDE 0.9 % IV SOLN
2.0000 g | Freq: Once | INTRAVENOUS | Status: AC
  Administered 2017-12-22: 2 g via INTRAVENOUS
  Filled 2017-12-22: qty 2

## 2017-12-22 MED ORDER — VANCOMYCIN HCL 10 G IV SOLR
1250.0000 mg | Freq: Once | INTRAVENOUS | Status: AC
  Administered 2017-12-22: 1250 mg via INTRAVENOUS
  Filled 2017-12-22: qty 1250

## 2017-12-22 MED ORDER — GI COCKTAIL ~~LOC~~
30.0000 mL | Freq: Once | ORAL | Status: AC
  Administered 2017-12-22: 30 mL via ORAL
  Filled 2017-12-22: qty 30

## 2017-12-22 MED ORDER — APIXABAN 5 MG PO TABS
10.0000 mg | ORAL_TABLET | Freq: Two times a day (BID) | ORAL | Status: DC
  Administered 2017-12-22 – 2017-12-24 (×5): 10 mg via ORAL
  Filled 2017-12-22 (×3): qty 2
  Filled 2017-12-22: qty 4
  Filled 2017-12-22: qty 2

## 2017-12-22 NOTE — Progress Notes (Signed)
A consult was received from an ED physician for Vancomycin, cefepime  per pharmacy dosing.  The patient's profile has been reviewed for ht/wt/allergies/indication/available labs.   A one time order has been placed for Vancomycin 1250mg  iv x1, and Cefepime 2gm iv x1.  Further antibiotics/pharmacy consults should be ordered by admitting physician if indicated.                       Thank you, Nani Skillern Crowford 12/22/2017  6:37 AM

## 2017-12-22 NOTE — H&P (Addendum)
History and Physical    Christopher Morris HUT:[id number] DOB: [birth year] DOA: 12/22/2017  PCP: Patient, No Pcp Per   Patient coming from: Home    Chief Complaint: Shortness of breath, abdominal pain  HPI: Christopher Morris is a 77 y.o. male with medical history significant of glioblastoma multiforme, status post craniotomy on 09/30/2017, status post radiotherapy, seizure disorder, anemia who presented to the emergency department from home with complaints of shortness of breath.  He said last night when he went to bed he started having shortness of breath and he has to breath very fast. patient was also complaining of upper abdominal pain and said he had similar complain when he was admitted last time and was found to have pneumonia.  Patient was admitted and discharged on 12/20/2017 after being managed for acute pulmonary embolism, acute DVT and pneumonia.  Patient says he did not feel well after going home.  He was taking the antibiotics as prescribed.  He continued to feel short of breath.  Patient is a poor historian.  He has  underlying dementia. He has memory problems. Patient seen and examined the bedside in the emergency department.  He appeared slightly tachypnea.  He did not report any fever, chills, chest pain, dysuria, nausea, vomiting, diarrhea or headache.   [facility] hospital[facility] was called for admission.  CT chest showed worsening right left lower airspace opacification consistent for pneumonia.  ED Course: Patient was started on vancomycin cefepime for pneumonia.  Review of Systems: As per HPI otherwise 10 point review of systems negative.    Past Medical History:  Diagnosis Date  . Brain cancer ([place])   . Glioblastoma ([place]) 09/25/2017  . Hyperglycemia 09/25/2017  . Seizure ([place]) 09/25/2017    Past Surgical History:  Procedure Laterality Date  . APPLICATION OF CRANIAL NAVIGATION N/A 09/30/2017   Procedure: APPLICATION OF CRANIAL NAVIGATION;  Surgeon: [name], MD;  Location: [place];  Service: Neurosurgery;  Laterality: N/A;  . CRANIOTOMY N/A 09/30/2017   Procedure: CRANIOTOMY FOR RESECTION OF BRAIN TUMOR WITH STEREOTACTIC;  Surgeon: [name], MD;  Location: [place];  Service: Neurosurgery;  Laterality: N/A;  CRANIOTOMY FOR RESECTION OF BRAIN TUMOR WITH STEREOTACTIC     reports that he has never smoked. He has never used smokeless tobacco. He reports that he does not drink alcohol or use drugs.  Allergies  Allergen Reactions  . Pork-Derived Products     Patient does not eat pork  . Shellfish Allergy     Patient does not eat shellfish    Family History  Problem Relation Age of Onset  . Hypertension Daughter   . Stroke Mother   . Cancer Neg Hx      Prior to Admission medications   Medication Sig Start Date End Date Taking? Authorizing Provider  apixaban (ELIQUIS) 5 MG TABS tablet Take 10 mg BID till 12/24/2017, start taking 5 mg BID from 12/25/2017 12/20/17  Yes Lavina Hamman, MD  azithromycin (ZITHROMAX) 500 MG tablet Take 1 tablet (500 mg total) by mouth daily for 5 days. Take 1 tablet daily for 3 days. 12/20/17 12/25/17 Yes Lavina Hamman, MD  cefpodoxime (VANTIN) 200 MG tablet Take 1 tablet (200 mg total) by mouth 2 (two) times daily for 5 days. 12/20/17 12/25/17 Yes Lavina Hamman, MD  cyanocobalamin (,VITAMIN B-12,) 1000 MCG/ML injection Inject 1 mL (1,000 mcg total) into the muscle daily. Take Vitamin B12 1061mcg IM daily x 5 days, then vitamin B12 1030mcg IM weekly, then vitamin B12  1010mcg monthly. 11/17/17  Yes Eugenie Filler, MD  hydrocortisone cream 1 % Apply 1 application topically 2 (two) times daily as needed for itching.   Yes [provider]  levETIRAcetam (KEPPRA) 1000 MG tablet Take 1 tablet (1,000 mg total) by mouth 2 (two) times daily. 11/26/17  Yes Vaslow, Acey Lav, MD  SYRINGE-NEEDLE, DISP, 5 ML (SAFETY SYRINGES/NEEDLE) 21G X 1-1/2" 5 ML MISC 1,000 mcg by Does not apply route daily. Patient not taking: Reported on 12/05/2017  11/16/17   Eugenie Filler, MD    Physical Exam: Vitals:   12/22/17 0500 12/22/17 0530 12/22/17 0700 12/22/17 0730  BP: 124/70 138/83 132/81 (!) 128/96  Pulse: 80 75 75 80  Resp: 20 (!) 22 18 (!) 25  Temp:      TempSrc:      SpO2: 98% 98% 99% 99%  Weight:      Height:        Constitutional: Not in acute distress,elderly male Vitals:   12/22/17 0500 12/22/17 0530 12/22/17 0700 12/22/17 0730  BP: 124/70 138/83 132/81 (!) 128/96  Pulse: 80 75 75 80  Resp: 20 (!) 22 18 (!) 25  Temp:      TempSrc:      SpO2: 98% 98% 99% 99%  Weight:      Height:       Eyes: PERRL, lids and conjunctivae normal ENMT: Mucous membranes are moist. Posterior pharynx clear of any exudate or lesions.Normal dentition.  Neck: normal, supple, no masses, no thyromegaly Respiratory: He has bilateral air entry, tachypnea.  Cardiovascular: Regular rate and rhythm, no murmurs / rubs / gallops. No extremity edema. 2+ pedal pulses. No carotid bruits.  Abdomen: no tenderness, no masses palpated. No hepatosplenomegaly. Bowel sounds positive.  Musculoskeletal: no clubbing / cyanosis. No joint deformity upper and lower extremities. Good ROM, no contractures. Normal muscle tone.  Skin: no rashes, lesions, ulcers. No induration Neurologic: CN 2-12 grossly intact. Sensation intact, DTR normal. Strength 5/5 in all 4.  Psychiatric:  Alert and oriented  . Slow in response  Foley Catheter:None  Labs on Admission: I have personally reviewed following labs and imaging studies  CBC: Recent Labs  Lab 12/18/17 0117 12/18/17 0659 12/19/17 0438 12/22/17 0359  WBC 11.4* 10.5 11.8* 10.4  NEUTROABS 8.0*  --  9.6* 6.9  HGB 10.0* 9.8* 8.6* 9.4*  HCT 31.0* 30.1* 26.0* 28.2*  MCV 94.8 94.4 92.9 92.8  PLT 231 205 192 784   Basic Metabolic Panel: Recent Labs  Lab 12/18/17 0131 12/18/17 0659 12/19/17 0438 12/22/17 0359  NA 139 140 140 138  K 3.4* 3.5 3.9 3.7  CL 104 106 109 103  CO2 26 24 24 28   GLUCOSE 138* 153*  197* 127*  BUN 8 8 10 13   CREATININE 0.82 0.76 0.71 0.69  CALCIUM 9.2 8.6* 8.6* 8.6*  MG  --   --  1.8  --    GFR: Estimated Creatinine Clearance: 91.9 mL/min (by C-G formula based on SCr of 0.69 mg/dL). Liver Function Tests: Recent Labs  Lab 12/18/17 0131 12/18/17 0659 12/19/17 0438  AST 15 19 16   ALT 13* 12* 15*  ALKPHOS 95 79 67  BILITOT 0.8 0.9 0.9  PROT 7.3 6.3* 6.2*  ALBUMIN 4.0 3.3* 3.0*   No results for input(s): LIPASE, AMYLASE in the last 168 hours. No results for input(s): AMMONIA in the last 168 hours. Coagulation Profile: Recent Labs  Lab 12/19/17 0438  INR 1.53   Cardiac Enzymes: Recent Labs  Lab 12/18/17 0658 12/18/17 1051 12/18/17 1630  TROPONINI <0.03 <0.03 <0.03   BNP (last 3 results) No results for input(s): PROBNP in the last 8760 hours. HbA1C: No results for input(s): HGBA1C in the last 72 hours. CBG: No results for input(s): GLUCAP in the last 168 hours. Lipid Profile: No results for input(s): CHOL, HDL, LDLCALC, TRIG, CHOLHDL, LDLDIRECT in the last 72 hours. Thyroid Function Tests: No results for input(s): TSH, T4TOTAL, FREET4, T3FREE, THYROIDAB in the last 72 hours. Anemia Panel: No results for input(s): VITAMINB12, FOLATE, FERRITIN, TIBC, IRON, RETICCTPCT in the last 72 hours. Urine analysis:    Component Value Date/Time   COLORURINE YELLOW 12/18/2017 0227   APPEARANCEUR CLEAR 12/18/2017 0227   LABSPEC 1.012 12/18/2017 0227   PHURINE 5.0 12/18/2017 0227   GLUCOSEU NEGATIVE 12/18/2017 0227   HGBUR NEGATIVE 12/18/2017 0227   BILIRUBINUR NEGATIVE 12/18/2017 0227   KETONESUR NEGATIVE 12/18/2017 0227   PROTEINUR NEGATIVE 12/18/2017 0227   NITRITE NEGATIVE 12/18/2017 0227   LEUKOCYTESUR NEGATIVE 12/18/2017 0227    Radiological Exams on Admission: Dg Chest 2 View  Result Date: 12/22/2017 CLINICAL DATA:  Initial evaluation for acute shortness of breath. EXAM: CHEST - 2 VIEW COMPARISON:  Prior radiograph from 12/18/2017. FINDINGS:  Mild cardiomegaly. Mediastinal silhouette within normal limits. Aortic atherosclerosis. Lungs are hypoinflated. Small layering bilateral pleural effusions. Associated bibasilar opacities may reflect atelectasis and/or infiltrates, right greater than left. Perihilar vascular congestion without frank pulmonary edema. No pneumothorax. No acute osseus abnormality. IMPRESSION: 1. Low lung volumes with small bilateral pleural effusions. Associated bibasilar opacities may reflect atelectasis and/or infiltrates, right greater than left. 2. Cardiomegaly with perihilar vascular congestion without frank pulmonary edema. Electronically Signed   By: Jeannine Boga M.D.   On: 12/22/2017 04:01   Ct Angio Chest Pe W And/or Wo Contrast  Result Date: 12/22/2017 CLINICAL DATA:  Assess interval change in patient's known right-sided pulmonary embolus. Shortness of breath. EXAM: CT ANGIOGRAPHY CHEST WITH CONTRAST TECHNIQUE: Multidetector CT imaging of the chest was performed using the standard protocol during bolus administration of intravenous contrast. Multiplanar CT image reconstructions and MIPs were obtained to evaluate the vascular anatomy. CONTRAST:  116mL ISOVUE-370 IOPAMIDOL (ISOVUE-370) INJECTION 76% COMPARISON:  CTA of the chest performed 12/18/2017, and chest radiograph performed earlier today at 3:46 a.m. FINDINGS: Cardiovascular: The patient's right-sided pulmonary embolus is unchanged from the prior study. However, it is better seen due to less motion artifact on the current study. There is embolus in the right main pulmonary artery, extending distally into subsegmental branches. The RV/LV ratio is now 1.9, corresponding to significant right heart strain. The heart is normal in size. Scattered calcification is noted along the aortic arch. The great vessels are unremarkable in appearance. Mediastinum/Nodes: Enlarged mediastinal nodes are seen, measuring up to 1.6 cm in short axis at the azygoesophageal recess. No  pericardial effusion is identified. The thyroid gland is diminutive and grossly unremarkable. No axillary lymphadenopathy is seen. Lungs/Pleura: Worsening right lower lobe airspace opacification is concerning for pneumonia superimposed on pulmonary infarct. A small right pleural effusion is noted. Minimal left basilar atelectasis is noted. No pneumothorax is seen. No dominant mass is identified. Upper Abdomen: The visualized portions of the liver and the spleen are unremarkable in appearance. The gallbladder is grossly unremarkable. The visualized portions of the pancreas, adrenal glands and kidneys are unremarkable. Mild bilateral perinephric stranding is seen. Musculoskeletal: No acute osseous abnormalities are identified. Mild degenerative change is noted at the lower cervical spine. The visualized musculature is  unremarkable in appearance. Review of the MIP images confirms the above findings. IMPRESSION: 1. Right-sided pulmonary embolus is unchanged from the recent prior study. However, it is better seen due to decreased motion artifact on the prior study. Embolus noted at the right main pulmonary artery, extending distally into subsegmental branches. Positive for acute PE with CT evidence of right heart strain (RV/LV Ratio = 1.9) consistent with at least submassive (intermediate risk) PE. The presence of right heart strain has been associated with an increased risk of morbidity and mortality. Please activate Code PE by paging [phone]. 2. Worsening right lower lobe airspace opacification is concerning for pneumonia superimposed on pulmonary infarct. Small right pleural effusion noted. 3. Enlarged mediastinal nodes, measuring up to 1.6 cm in short axis at the azygoesophageal recess. This could reflect underlying infection. Critical Value/emergent results were called by telephone at the time of interpretation on 12/22/2017 at 6:10 am to [facility] PA, who verbally acknowledged these results. Electronically  Signed   By: Garald Balding M.D.   On: 12/22/2017 06:17     Assessment/Plan Principal Problem:   HCAP (healthcare-associated pneumonia) Active Problems:   Seizure ([place])   Frontal glioblastoma multiforme ([place])   Pulmonary embolism ([place])  Healthcare associated pneumonia: Presented with shortness of breath and tachypnea from home.  Was just discharged 2 days ago and was treated for pneumonia.  Was discharged on oral antibiotics.  Will broaden the antibiotic coverage.  Started on vancomycin and cefepime.  CT chest showed worsening right left lower lobe airspace opacification.  Patient did not report any fever or chills at home.  No leukocytosis on presentation.  Hemodynamically stable except for tachypnea. Continue supplemental oxygen as needed.  Continue bronchodilators as needed.  Acute pulmonary embolism: Found on last admission.  Also noted to have acute left lower extremity DVT.  He was started on Eliquis which will be continued as  10 mg twice a day till 6/11 and then 5 mg twice a day from 6/12.CT chest done today showed right-sided pulmonary embolus is unchanged from the recent prior study.Echo had shown ejection fraction was in the range of 60% to 65%,no regional wall motion abnormalities and normal eft ventricular diastolic function.  Glioblastoma multiforme : Status post resection on 09/30/2017.  Status post radiation and steroids.  Follows with neuro-oncology as an outpatient.  Seizure disorder: Secondary to glioblastoma multiforme .  On Keppra 1 gm BID which we will continue  Severity of Illness: The appropriate patient status for this patient is INPATIENT.  DVT prophylaxis: Eliquis Code Status: Full Family Communication: None present at the bedside Consults called: None     Shelly Coss MD [facility] Pager [phone]  If 7PM-7AM, please contact night-coverage [email or web address] Password [email or web address]  12/22/2017, 8:17 AM

## 2017-12-22 NOTE — ED Notes (Signed)
Bed: YJ09 Expected date:  Expected time:  Means of arrival:  Comments: 69m sob

## 2017-12-22 NOTE — Plan of Care (Signed)
Patient stable during 7 a to 7  p shift, maintains oxygen saturation in the mid to high 90's but requests to wear oxygen for comfort, 1 liter applied.  Patient medicated with Tylenol and GI cocktail x 1 for epigastric pain with relief.  Ambulated to bathroom with one standby assist.  Daughter at bedside.

## 2017-12-22 NOTE — Progress Notes (Signed)
Patient up to bathroom, noted to be tachypneic and dyspneic upon returning back to bed.  O2 sats remain in the mid to high 90's on room air.  MD notified regarding above, new orders received for [facility].

## 2017-12-22 NOTE — Progress Notes (Signed)
  Pharmacy Antibiotic Note  Christopher Morris is a 77 y.o. male admitted on 12/22/2017 with pneumonia.  Pharmacy has been consulted for vanc and cefepime dosing. First doses given in the ED.  Plan: Cefepime 2g IV q8h Vancomycin 750mg  IV q8h.  Measure Vanc peak and trough at steady state. Goal AUC 400-500. Follow up renal fxn, culture results, and clinical course.   Height: 6\' 4"  (193 cm) Weight: 194 lb 8 oz (88.2 kg) IBW/kg (Calculated) : 86.8  Temp (24hrs), Avg:99 F (37.2 C), Min:99 F (37.2 C), Max:99 F (37.2 C)  Recent Labs  Lab 12/18/17 0117 12/18/17 0131 12/18/17 0153 12/18/17 0659 12/19/17 0438 12/22/17 0359  WBC 11.4*  --   --  10.5 11.8* 10.4  CREATININE  --  0.82  --  0.76 0.71 0.69  LATICACIDVEN  --   --  1.54  --  1.4  --     Estimated Creatinine Clearance: 91.9 mL/min (by C-G formula based on SCr of 0.69 mg/dL).    Allergies  Allergen Reactions  . Pork-Derived Products     Patient does not eat pork  . Shellfish Allergy     Patient does not eat shellfish    Antimicrobials this admission: Cefepime 6/9 >> Vanc 6/9 >>  Dose adjustments this admission:   Microbiology results: BCx: UCx:  Sputum:  MRSA PCR:  Thank you for allowing pharmacy to be a part of this patient's care.  Romeo Rabon, PharmD. [place]: [phone]. 12/22/2017,8:34 AM.

## 2017-12-22 NOTE — ED Provider Notes (Signed)
[facility] DEPT Provider Note   CSN: [id number] Arrival date & time: 12/22/17  0147     History   Chief Complaint Chief Complaint  Patient presents with  . Shortness of Breath    HPI Christopher Morris is a 77 y.o. male this is glioblastoma, hyperglycemia, seizure who presents via EMS for evaluation of shortness of breath.  Patient is a very poor historian and cannot give me much details about his current symptoms.  Per EMS, patient called them, he was satting at 9394 without any increased work of breathing.  Patient states he needed the 2 L O2 for comfort care and felt like he could not breathe without it.  Patient was placed on 2 L which bumped him up to 96-97%  EM LEVEL 5 CAVEAT: DUE TO PATIENT'S BASELINE DEMENTIA  The history is provided by the patient.    Past Medical History:  Diagnosis Date  . Brain cancer ([place])   . Glioblastoma ([place]) 09/25/2017  . Hyperglycemia 09/25/2017  . Seizure ([place]) 09/25/2017    Patient Active Problem List   Diagnosis Date Noted  . Pneumonia 12/22/2017  . Tachycardia   . Vitamin B12 deficiency 11/14/2017  . Anemia 11/14/2017  . Fever 11/13/2017  . Falls 11/13/2017  . Dehydration 11/13/2017  . Interstitial pneumonia ([place]) 11/11/2017  . Sepsis ([place]) 11/09/2017  . Bacteremia due to Gram-negative bacteria 10/26/2017  . Sepsis due to Escherichia coli (E. coli) ([place]) 10/26/2017  . UTI (urinary tract infection) 10/25/2017  . Seizure ([place]) 09/25/2017  . Frontal glioblastoma multiforme ([place]) 09/25/2017  . Hyperglycemia 09/25/2017  . BLOOD IN STOOL 07/26/2008  . ERECTILE DYSFUNCTION 07/12/2008    Past Surgical History:  Procedure Laterality Date  . APPLICATION OF CRANIAL NAVIGATION N/A 09/30/2017   Procedure: APPLICATION OF CRANIAL NAVIGATION;  Surgeon: [name], MD;  Location: [facility];  Service: Neurosurgery;  Laterality: N/A;  . CRANIOTOMY N/A 09/30/2017   Procedure: CRANIOTOMY FOR RESECTION OF BRAIN TUMOR  WITH STEREOTACTIC;  Surgeon: [name], MD;  Location: [facility];  Service: Neurosurgery;  Laterality: N/A;  CRANIOTOMY FOR RESECTION OF BRAIN TUMOR WITH STEREOTACTIC        Home Medications    Prior to Admission medications   Medication Sig Start Date End Date Taking? Authorizing Provider  apixaban (ELIQUIS) 5 MG TABS tablet Take 10 mg BID till 12/24/2017, start taking 5 mg BID from 12/25/2017 12/20/17  Yes Lavina Hamman, MD  azithromycin (ZITHROMAX) 500 MG tablet Take 1 tablet (500 mg total) by mouth daily for 5 days. Take 1 tablet daily for 3 days. 12/20/17 12/25/17 Yes Lavina Hamman, MD  cefpodoxime (VANTIN) 200 MG tablet Take 1 tablet (200 mg total) by mouth 2 (two) times daily for 5 days. 12/20/17 12/25/17 Yes Lavina Hamman, MD  cyanocobalamin (,VITAMIN B-12,) 1000 MCG/ML injection Inject 1 mL (1,000 mcg total) into the muscle daily. Take Vitamin B12 1074mcg IM daily x 5 days, then vitamin B12 1080mcg IM weekly, then vitamin B12 1029mcg monthly. 11/17/17  Yes Eugenie Filler, MD  hydrocortisone cream 1 % Apply 1 application topically 2 (two) times daily as needed for itching.   Yes [provider]  levETIRAcetam (KEPPRA) 1000 MG tablet Take 1 tablet (1,000 mg total) by mouth 2 (two) times daily. 11/26/17  Yes Vaslow, Acey Lav, MD  acetaminophen (TYLENOL) 325 MG tablet Take 2 tablets (650 mg total) by mouth every 6 (six) hours as needed for mild pain (or Fever >/= 101). Patient  not taking: Reported on 12/05/2017 10/04/17   Velvet Bathe, MD  feeding supplement, ENSURE ENLIVE, (ENSURE ENLIVE) LIQD Take 237 mLs by mouth 2 (two) times daily between meals. Patient not taking: Reported on 12/05/2017 11/12/17   Hosie Poisson, MD  ondansetron (ZOFRAN) 8 MG tablet Take 1 tablet (8 mg total) by mouth 2 (two) times daily as needed. Start on the third day after chemotherapy. Patient not taking: Reported on 11/21/2017 11/07/17   Ventura Sellers, MD  polyethylene glycol [facility] / Floria Raveling)  packet Take 17 g by mouth daily. Patient not taking: Reported on 11/21/2017 11/17/17   Eugenie Filler, MD  senna-docusate (SENOKOT-S) 8.6-50 MG tablet Take 1 tablet by mouth 2 (two) times daily. Patient not taking: Reported on 11/21/2017 11/16/17   Eugenie Filler, MD  SYRINGE-NEEDLE, DISP, 5 ML (SAFETY SYRINGES/NEEDLE) 21G X 1-1/2" 5 ML MISC 1,000 mcg by Does not apply route daily. Patient not taking: Reported on 12/05/2017 11/16/17   Eugenie Filler, MD    Family History Family History  Problem Relation Age of Onset  . Hypertension Daughter   . Stroke Mother   . Cancer Neg Hx     Social History Social History   Tobacco Use  . Smoking status: Never Smoker  . Smokeless tobacco: Never Used  Substance Use Topics  . Alcohol use: No    Frequency: Never  . Drug use: No     Allergies   Pork-derived products and Shellfish allergy   Review of Systems Review of Systems  Unable to perform ROS: Dementia  Neurological: Negative for headaches.     Physical Exam Updated Vital Signs BP 132/81   Pulse 75   Temp 99 F (37.2 C) (Oral)   Resp 18   Ht 6\' 4"  (1.93 m)   Wt 88.2 kg (194 lb 8 oz)   SpO2 99%   BMI 23.68 kg/m   Physical Exam  Constitutional: He appears well-developed and well-nourished.  HENT:  Head: Normocephalic and atraumatic.  Mouth/Throat: Oropharynx is clear and moist and mucous membranes are normal.  Eyes: Pupils are equal, round, and reactive to light. Conjunctivae, EOM and lids are normal.  Neck: Full passive range of motion without pain.  Cardiovascular: Normal rate, regular rhythm, normal heart sounds and normal pulses. Exam reveals no gallop and no friction rub.  No murmur heard. Pulmonary/Chest: Effort normal. He has rales.  Able to speak in full sentences without any difficulty.  Diffuse rales heard throughout all lung fields.  Abdominal: Soft. Normal appearance. There is no tenderness. There is no rigidity and no guarding.  Musculoskeletal: Normal  range of motion.  Bilateral lower extremities are symmetric in appearance without any edema, overlying warmth, erythema.  Neurological: He is alert. He is disoriented.  Alert to person only. Intermittently follows commands.   Moves all extremities spontaneously.  Skin: Skin is warm and dry. Capillary refill takes less than 2 seconds.  Psychiatric: He has a normal mood and affect. His speech is normal.  Nursing note and vitals reviewed.    ED Treatments / Results  Labs (all labs ordered are listed, but only abnormal results are displayed) Labs Reviewed  CBC WITH DIFFERENTIAL/PLATELET - Abnormal; Notable for the following components:      Result Value   RBC 3.04 (*)    Hemoglobin 9.4 (*)    HCT 28.2 (*)    Monocytes Absolute 1.2 (*)    All other components within normal limits  BASIC METABOLIC PANEL - Abnormal; Notable  for the following components:   Glucose, Bld 127 (*)    Calcium 8.6 (*)    All other components within normal limits    EKG None  Radiology Dg Chest 2 View  Result Date: 12/22/2017 CLINICAL DATA:  Initial evaluation for acute shortness of breath. EXAM: CHEST - 2 VIEW COMPARISON:  Prior radiograph from 12/18/2017. FINDINGS: Mild cardiomegaly. Mediastinal silhouette within normal limits. Aortic atherosclerosis. Lungs are hypoinflated. Small layering bilateral pleural effusions. Associated bibasilar opacities may reflect atelectasis and/or infiltrates, right greater than left. Perihilar vascular congestion without frank pulmonary edema. No pneumothorax. No acute osseus abnormality. IMPRESSION: 1. Low lung volumes with small bilateral pleural effusions. Associated bibasilar opacities may reflect atelectasis and/or infiltrates, right greater than left. 2. Cardiomegaly with perihilar vascular congestion without frank pulmonary edema. Electronically Signed   By: Jeannine Boga M.D.   On: 12/22/2017 04:01   Ct Angio Chest Pe W And/or Wo Contrast  Result Date:  12/22/2017 CLINICAL DATA:  Assess interval change in patient's known right-sided pulmonary embolus. Shortness of breath. EXAM: CT ANGIOGRAPHY CHEST WITH CONTRAST TECHNIQUE: Multidetector CT imaging of the chest was performed using the standard protocol during bolus administration of intravenous contrast. Multiplanar CT image reconstructions and MIPs were obtained to evaluate the vascular anatomy. CONTRAST:  192mL ISOVUE-370 IOPAMIDOL (ISOVUE-370) INJECTION 76% COMPARISON:  CTA of the chest performed 12/18/2017, and chest radiograph performed earlier today at 3:46 a.m. FINDINGS: Cardiovascular: The patient's right-sided pulmonary embolus is unchanged from the prior study. However, it is better seen due to less motion artifact on the current study. There is embolus in the right main pulmonary artery, extending distally into subsegmental branches. The RV/LV ratio is now 1.9, corresponding to significant right heart strain. The heart is normal in size. Scattered calcification is noted along the aortic arch. The great vessels are unremarkable in appearance. Mediastinum/Nodes: Enlarged mediastinal nodes are seen, measuring up to 1.6 cm in short axis at the azygoesophageal recess. No pericardial effusion is identified. The thyroid gland is diminutive and grossly unremarkable. No axillary lymphadenopathy is seen. Lungs/Pleura: Worsening right lower lobe airspace opacification is concerning for pneumonia superimposed on pulmonary infarct. A small right pleural effusion is noted. Minimal left basilar atelectasis is noted. No pneumothorax is seen. No dominant mass is identified. Upper Abdomen: The visualized portions of the liver and the spleen are unremarkable in appearance. The gallbladder is grossly unremarkable. The visualized portions of the pancreas, adrenal glands and kidneys are unremarkable. Mild bilateral perinephric stranding is seen. Musculoskeletal: No acute osseous abnormalities are identified. Mild degenerative  change is noted at the lower cervical spine. The visualized musculature is unremarkable in appearance. Review of the MIP images confirms the above findings. IMPRESSION: 1. Right-sided pulmonary embolus is unchanged from the recent prior study. However, it is better seen due to decreased motion artifact on the prior study. Embolus noted at the right main pulmonary artery, extending distally into subsegmental branches. Positive for acute PE with CT evidence of right heart strain (RV/LV Ratio = 1.9) consistent with at least submassive (intermediate risk) PE. The presence of right heart strain has been associated with an increased risk of morbidity and mortality. Please activate Code PE by paging [phone]. 2. Worsening right lower lobe airspace opacification is concerning for pneumonia superimposed on pulmonary infarct. Small right pleural effusion noted. 3. Enlarged mediastinal nodes, measuring up to 1.6 cm in short axis at the azygoesophageal recess. This could reflect underlying infection. Critical Value/emergent results were called by telephone at the time of  interpretation on 12/22/2017 at 6:10 am to [facility] PA, who verbally acknowledged these results. Electronically Signed   By: Garald Balding M.D.   On: 12/22/2017 06:17    Procedures Procedures (including critical care time)  Medications Ordered in ED Medications  vancomycin (VANCOCIN) 1,250 mg in sodium chloride 0.9 % 250 mL IVPB (has no administration in time range)  iopamidol (ISOVUE-370) 76 % injection 100 mL (100 mLs Intravenous Contrast Given 12/22/17 0542)  ceFEPIme (MAXIPIME) 2 g in sodium chloride 0.9 % 100 mL IVPB (2 g Intravenous New Bag/Given 12/22/17 0646)     Initial Impression / Assessment and Plan / ED Course  I have reviewed the triage vital signs and the nursing notes.  Pertinent labs & imaging results that were available during my care of the patient were reviewed by me and considered in my medical decision making (see  chart for details).     77 year old male with history of GBM, recently admitted and found to have PE who presents for evaluation of shortness of breath.  Called EMS today because he states that he was having difficulty breathing.  EMS reports he was discharged from the hospital today, though I still see any discharge summary.  On EMS arrival, O2 sats were 93 to 94% on room air.  No increased work of breathing noted.  No wheezing.  Patient insisted that he needed oxygen for comfort care.  O2 sats improved to 96-97 with 2 L O2.  He has no baseline O2 requirement.  On initial ED arrival, patient is afebrile.  He is slightly tachypneic but otherwise vital signs stable.  On exam, he does have some diffuse rales noted.  Bilateral lower extremities are symmetric in appearance.  Limited history given patient's condition.  We will plan to check basic labs, chest x-ray.  BMP unremarkable.  CBC shows hemoglobin of 9.4.  Chest x-ray shows low lung volumes with bilateral pleural effusions.  Additionally, there is bibasilar opacities, right greater than left.  Cardiomegaly noted without frank pulmonary edema. Discussed patient with Dr. Alvino Chapel. Given recent PE diagnosis and unclear history, will plan for repeat CTA chest for evaluation of known PE.   CTA of chest shows right-sided PE in the right main pulmonary artery that extends distally in the subsegmental branches.  Additionally, there is evidence of right heart strain.  No acute changes noted.  He does appear to have worsening pneumonia.  It appears on imaging from previous hospital visits that there was questionable opacities that could be indicative of infiltrate.  On today's CTA chest, shows worsening pneumonia or pulmonary infarct.  Given concerns of pneumonia, shortness breath, will plan for admission.  H CAP antibiotic started.  Discussed patient with hospitalist. Will admit.   Final Clinical Impressions(s) / ED Diagnoses   Final diagnoses:  HCAP  (healthcare-associated pneumonia)  Iatrogenic pulmonary embolism and infarction, subsequent encounter [facility])    ED Discharge Orders    None       Desma Mcgregor 12/22/17 Kerby Nora, MD 12/22/17 [phone]

## 2017-12-22 NOTE — Discharge Summary (Addendum)
[facility] Discharge Summary   Patient: Christopher Morris ZOX:[id number]   PCP: Patient, No Pcp Per DOB: [birth year]   Date of admission: 12/18/2017   Date of discharge: 12/20/2017    Discharge Diagnoses:  Active Problems:   Sepsis ([address])   Admitted From: home Disposition:  Home with home health  Recommendations for Outpatient Follow-up:  1. Please follow-up with PCP at [facility] in 1 week.  Needs a prescription for home health as well as for the prescription for anticoagulation.  Follow-up Information    PCP at [facility]. Schedule an appointment as soon as possible for a visit in 1 week(s).   Why:  needs home health prescription as well as further prescription for apixaban for PE         Diet recommendation: Regular diet  Activity: The patient is advised to gradually reintroduce usual activities.  Discharge Condition: good  Code Status: Full code  History of present illness: As per the H and P dictated on admission, "Garion Wempe  is a 77 y.o. male, w GBM, s/p craniotomy 09/30/2017, s/p XRT, seizures, vitamin b12 deficiency, Anemia, recent admission for fever and Hcap 11/16/2017 apparently presents w c/o dyspnea per EMS.   In ED,  CXR IMPRESSION: Lungs hypoexpanded, with mild bibasilar atelectasis. Mild cardiomegaly.  Wbc 11.4, Hgb 10.0, Plt 231 BNP 44.2  Na 139, K 3.4 Bun 8, Creatinine 0.82 Ast 15, Alt 13 Trop 0.00  Urinalysis negative  Pt will be admitted for ? dyspnea"  Hospital Course:  Summary of his active problems in the hospital is as following. 1.  Acute pulmonary embolism. Acute left common femoral vein SF J femoral vein profunda vein popliteal vein posterior tibial vein and peroneal vein DVT. Also right SSV thrombosis. Discussed with neuro oncology, agreeable for anticoagulation. Patient will be started on Eliquis. Appears to be tolerating it very well since no acute neurological changes in last 48hours since the start of the medication. Currently  hemodynamically stable without any evidence of hypoxia, hypotension, tachycardia as well as chest pain resolved.  2.  Sepsis. Healthcare associated pneumonia.  Presented with fever, tachypnea. Influenza PCR negative, MRSA PCR negative. Blood cultures so far negative as well. Airspace consolidation in the right lower lobe consistent with pneumonia. Start on IV vancomycin and cefepime, currently on IV cefepime. Transition to oral Ceftin and azithromycin for 5 more day treatment course for total of 7-day.  3.  Recurrent unresponsive episode. Daughter reports that patient in between conversation will give a blank stare. With his history of CNS tumor possibility of seizure cannot be ruled out. No further similar unresponsive episodes in the hospital. Continue Keppra 1000 mg twice daily.  4.  GBM. S/P radiation and steroids.  Continue at home he with resection on 09/30/2017 Neuro oncology considering possible chemotherapy. Outpatient follow-up.  5.  Diffuse macular papular rash. This rash involves torso both front and back. Non-itching.  Nonblanching. Etiology is currently unclear. Patient is responding to steroid as well as IV antibiotic. Patient was started on Decadron on admission  we will taper it off in the next 1days.  6.  Seizure disorder. Secondary to GBM. Completed Decadron therapy recently as an outpatient around Nov 27, 2017. On Keppra 1000 mg twice daily, dose recently increased neuro-oncology.  All other chronic medical condition were stable during the hospitalization.  Patient was seen by physical therapy, who recommended home health, which was arranged by Education officer, museum and case Freight forwarder. On the day of the discharge the patient's vitals were stable ,  and no other acute medical condition were reported by patient. the patient was felt safe to be discharge at home with home health.  Consultants: none Procedures: none  DISCHARGE MEDICATION: Allergies as of 12/20/2017        Reactions   Pork-derived Products    Patient does not eat pork   Shellfish Allergy    Patient does not eat shellfish      Medication List    STOP taking these medications   dexamethasone 4 MG tablet Commonly known as:  DECADRON     TAKE these medications   apixaban 5 MG Tabs tablet Commonly known as:  ELIQUIS Take 10 mg BID till 12/24/2017, start taking 5 mg BID from 12/25/2017   azithromycin 500 MG tablet Commonly known as:  ZITHROMAX Take 1 tablet (500 mg total) by mouth daily for 5 days. Take 1 tablet daily for 3 days.   cefpodoxime 200 MG tablet Commonly known as:  VANTIN Take 1 tablet (200 mg total) by mouth 2 (two) times daily for 5 days.   cyanocobalamin 1000 MCG/ML injection Commonly known as:  (VITAMIN B-12) Inject 1 mL (1,000 mcg total) into the muscle daily. Take Vitamin B12 1028mcg IM daily x 5 days, then vitamin B12 1042mcg IM weekly, then vitamin B12 1058mcg monthly.   levETIRAcetam 1000 MG tablet Commonly known as:  KEPPRA Take 1 tablet (1,000 mg total) by mouth 2 (two) times daily.   SYRINGE-NEEDLE (DISP) 5 ML 21G X 1-1/2" 5 ML Misc Commonly known as:  SAFETY SYRINGES/NEEDLE 1,000 mcg by Does not apply route daily.     ASK your doctor about these medications   dexamethasone 4 MG tablet Commonly known as:  DECADRON Take 1 tablet (4 mg total) by mouth daily for 1 day. Ask about: Should I take this medication?      Allergies  Allergen Reactions  . Pork-Derived Products     Patient does not eat pork  . Shellfish Allergy     Patient does not eat shellfish   Discharge Instructions    Diet - low sodium heart healthy   Complete by:  As directed    Discharge instructions   Complete by:  As directed    It is important that you read following instructions as well as go over your medication list with RN to help you understand your care after this hospitalization.  Discharge Instructions: Please follow-up with PCP in one week  Please request  your primary care physician to go over all Hospital Tests and Procedure/Radiological results at the follow up,  Please get all Hospital records sent to your PCP by signing hospital release before you go home.   Do not take more than prescribed Pain, Sleep and Anxiety Medications. You were cared for by a hospitalist during your hospital stay. If you have any questions about your discharge medications or the care you received while you were in the hospital after you are discharged, you can call the unit and ask to speak with the hospitalist on call if the hospitalist that took care of you is not available.  Once you are discharged, your primary care physician will handle any further medical issues. Please note that NO REFILLS for any discharge medications will be authorized once you are discharged, as it is imperative that you return to your primary care physician (or establish a relationship with a primary care physician if you do not have one) for your aftercare needs so that they can reassess your need for  medications and monitor your lab values. You Must read complete instructions/literature along with all the possible adverse reactions/side effects for all the Medicines you take and that have been prescribed to you. Take any new Medicines after you have completely understood and accept all the possible adverse reactions/side effects. Wear Seat belts while driving. If you have smoked or chewed Tobacco in the last 2 yrs please stop smoking and/or stop any Recreational drug use.   Increase activity slowly   Complete by:  As directed      Discharge Exam: Filed Weights   12/18/17 0553 12/19/17 0500 12/20/17 0447  Weight: 87.8 kg (193 lb 9 oz) 89.7 kg (197 lb 12 oz) 88.2 kg (194 lb 8 oz)   Vitals:   12/19/17 2036 12/20/17 0447  BP: 139/80 (!) 146/83  Pulse: 88 84  Resp: 16 18  Temp: 99.1 F (37.3 C) 99.1 F (37.3 C)  SpO2: 93% 100%   General: Appear in no distress, diffuse macular brownish  color rash; Oral Mucosa moist. Cardiovascular: S1 and S2 Present, no Murmur, no JVD Respiratory: Bilateral Air entry present and Clear to Auscultation, no Crackles, no wheezes Abdomen: Bowel Sound present, Soft and no tenderness Extremities: no Pedal edema, no calf tenderness Neurology: Grossly no focal neuro deficit.  The results of significant diagnostics from this hospitalization (including imaging, microbiology, ancillary and laboratory) are listed below for reference.    Significant Diagnostic Studies:  Dg Chest 2 View  Result Date: 12/18/2017 CLINICAL DATA:  Acute onset of shortness of breath. EXAM: CHEST - 2 VIEW COMPARISON:  Chest radiograph performed 11/13/2017 FINDINGS: The lungs are hypoexpanded. Mild bibasilar atelectasis is noted. No definite pleural effusion or pneumothorax is seen. The heart is mildly enlarged. No acute osseous abnormalities are seen. IMPRESSION: Lungs hypoexpanded, with mild bibasilar atelectasis. Mild cardiomegaly. Electronically Signed   By: Garald Balding M.D.   On: 12/18/2017 02:28   Ct Angio Chest Pe W Or Wo Contrast  Result Date: 12/18/2017 CLINICAL DATA:  Shortness of breath and fever EXAM: CT ANGIOGRAPHY CHEST WITH CONTRAST TECHNIQUE: Multidetector CT imaging of the chest was performed using the standard protocol during bolus administration of intravenous contrast. Multiplanar CT image reconstructions and MIPs were obtained to evaluate the vascular anatomy. CONTRAST:  46mL ISOVUE-370 IOPAMIDOL (ISOVUE-370) INJECTION 76% COMPARISON:  Chest CT September 25, 2017; chest radiograph December 18, 2017 FINDINGS: Cardiovascular: There are multiple pulmonary emboli throughout the right lower lobe pulmonary arterial system. No pulmonary emboli are noted to the left of midline or in either main pulmonary artery. The right ventricle to left ventricle diameter ratio is less than 0.9, not consistent with right heart strain. There is no appreciable thoracic aortic aneurysm or  dissection. Visualized great vessels appear unremarkable. There is aortic atherosclerosis. There are occasional foci coronary artery calcification. There is no pericardial effusion or pericardial thickening evident. Mediastinum/Nodes: Thyroid appears normal. There is no appreciable adenopathy. There are scattered subcentimeter lymph nodes which do not meet size criteria for pathologic significance. No esophageal lesions are evident. Lungs/Pleura: There is patchy airspace consolidation in portions of the lateral and posterior segments of the right lower lobe. There is a small right pleural effusion. No pulmonary infarct is demonstrable. There is bibasilar atelectasis as well. Upper Abdomen: Visualized upper abdominal structures appear normal. Musculoskeletal: There is degenerative change in thoracic spine. There are no blastic or lytic bone lesions. There is a degree of gynecomastia bilaterally. Review of the MIP images confirms the above findings. IMPRESSION: 1. Multiple right  lower lobe pulmonary artery branch emboli. No more central pulmonary embolus. No pulmonary embolus to the left of midline. No right heart strain evident. 2. Small left pleural effusion with areas of airspace consolidation in portions of the lateral and posterior segments of the right lower lobe, likely pneumonia. No wedge-shaped defect to suggest pulmonary infarct appreciable. There is bibasilar atelectasis. 3. Aortic atherosclerosis. Foci coronary artery calcification noted. No thoracic aortic aneurysm or dissection. 4.  No demonstrable thoracic adenopathy. 5.  Mild gynecomastia bilaterally. Aortic Atherosclerosis (ICD10-I70.0). Critical Value/emergent results were called by telephone at the time of interpretation on 12/18/2017 at 10:46 am to Dr. Berle Mull , who verbally acknowledged these results. Electronically Signed   By: Lowella Grip III M.D.   On: 12/18/2017 10:46   Dg Abd 2 Views  Result Date: 12/18/2017 CLINICAL DATA:   Abdominal pain EXAM: ABDOMEN - 2 VIEW COMPARISON:  None. FINDINGS: The bowel gas pattern is normal. There is no evidence of free air. No radio-opaque calculi or other significant radiographic abnormality is seen. IMPRESSION: Negative. Electronically Signed   By: Ulyses Jarred M.D.   On: 12/18/2017 05:43    Microbiology: Recent Results (from the past 240 hour(s))  Blood Culture (routine x 2)     Status: None (Preliminary result)   Collection Time: 12/18/17  1:31 AM  Result Value Ref Range Status   Specimen Description   Final    BLOOD RIGHT ANTECUBITAL Performed at [facility] [address] [place], [place] [address]    Special Requests   Final    BOTTLES DRAWN AEROBIC AND ANAEROBIC Blood Culture adequate volume Performed at [facility] [address] [place], [place] [address]    Culture   Final    NO GROWTH 3 DAYS Performed at [facility], [place] [address], [place], [place] [address]    Report Status PENDING  Incomplete  Blood Culture (routine x 2)     Status: None (Preliminary result)   Collection Time: 12/18/17  1:50 AM  Result Value Ref Range Status   Specimen Description   Final    BLOOD LEFT HAND Performed at [facility] [address] [place], [place] [address]    Special Requests   Final    BOTTLES DRAWN AEROBIC AND ANAEROBIC Blood Culture adequate volume Performed at [facility] [address] [place], [place] [address]    Culture   Final    NO GROWTH 3 DAYS Performed at [facility], [place] [address] [place], [place] [address]    Report Status PENDING  Incomplete  Urine culture     Status: None   Collection Time: 12/18/17  2:27 AM  Result Value Ref Range Status   Specimen Description   Final    URINE, CLEAN CATCH Performed at [facility], [place] [address] [place], [place] [address]    Special Requests   Final    Immunocompromised Performed at  [facility], [place] [address], [place], [place] [address]    Culture   Final    NO GROWTH Performed at [facility], [place] [address], [place], [place] [address]    Report Status 12/19/2017 FINAL  Final  Respiratory Panel by PCR     Status: None   Collection Time: 12/18/17  6:13 AM  Result Value Ref Range Status   Adenovirus NOT DETECTED NOT DETECTED Final   Coronavirus 229E NOT DETECTED NOT DETECTED  Final   Coronavirus HKU1 NOT DETECTED NOT DETECTED Final   Coronavirus NL63 NOT DETECTED NOT DETECTED Final   Coronavirus OC43 NOT DETECTED NOT DETECTED Final   Metapneumovirus NOT DETECTED NOT DETECTED Final   Rhinovirus / Enterovirus NOT DETECTED NOT DETECTED Final   Influenza A NOT DETECTED NOT DETECTED Final   Influenza B NOT DETECTED NOT DETECTED Final   Parainfluenza Virus 1 NOT DETECTED NOT DETECTED Final   Parainfluenza Virus 2 NOT DETECTED NOT DETECTED Final   Parainfluenza Virus 3 NOT DETECTED NOT DETECTED Final   Parainfluenza Virus 4 NOT DETECTED NOT DETECTED Final   Respiratory Syncytial Virus NOT DETECTED NOT DETECTED Final   Bordetella pertussis NOT DETECTED NOT DETECTED Final   Chlamydophila pneumoniae NOT DETECTED NOT DETECTED Final   Mycoplasma pneumoniae NOT DETECTED NOT DETECTED Final    Comment: Performed at [facility], [place] [address] [place], [place] [address]  MRSA PCR Screening     Status: None   Collection Time: 12/18/17  3:39 PM  Result Value Ref Range Status   MRSA by PCR NEGATIVE NEGATIVE Final    Comment:        The GeneXpert MRSA Assay (FDA approved for NASAL specimens only), is one component of a comprehensive MRSA colonization surveillance program. It is not intended to diagnose MRSA infection nor to guide or monitor treatment for MRSA infections. Performed at [facility], [place] [address] [place], [place] [address]      Labs: CBC: Lab 12/18/17 0117 12/18/17 0659 12/19/17 0438  WBC 11.4*  10.5 11.8*  NEUTROABS 8.0*  --  9.6*  HGB 10.0* 9.8* 8.6*  HCT 31.0* 30.1* 26.0*  MCV 94.8 94.4 92.9  PLT 231 205 824   Basic Metabolic Panel: Lab 23/53/61 0131 12/18/17 0659 12/19/17 0438  NA 139 140 140  K 3.4* 3.5 3.9  CL 104 106 109  CO2 26 24 24   GLUCOSE 138* 153* 197*  BUN 8 8 10   CREATININE 0.82 0.76 0.71  CALCIUM 9.2 8.6* 8.6*  MG  --   --  1.8   Liver Function Tests: Recent Labs  Lab 12/18/17 0131 12/18/17 0659 12/19/17 0438  AST 15 19 16   ALT 13* 12* 15*  ALKPHOS 95 79 67  BILITOT 0.8 0.9 0.9  PROT 7.3 6.3* 6.2*  ALBUMIN 4.0 3.3* 3.0*   No results for input(s): LIPASE, AMYLASE in the last 168 hours. No results for input(s): AMMONIA in the last 168 hours. Cardiac Enzymes: Recent Labs  Lab 12/18/17 0658 12/18/17 1051 12/18/17 1630  TROPONINI <0.03 <0.03 <0.03   BNP (last 3 results) Recent Labs    12/18/17 0117  BNP 44.2   CBG: No results for input(s): GLUCAP in the last 168 hours. Time spent: 35 minutes  Signed:  Berle Mull  [facility] 12/20/2017 , 8:35 AM

## 2017-12-22 NOTE — ED Notes (Signed)
Patient sat' is 93-96 on room air. Patient wants oxygen to help him feel better.

## 2017-12-22 NOTE — ED Notes (Signed)
ED TO INPATIENT HANDOFF REPORT  Name/Age/Gender Christopher Morris 77 y.o. male  Code Status    Code Status Orders  (From admission, onward)        Start     Ordered   12/22/17 0817  Full code  Continuous     12/22/17 0816    Code Status History    Date Active Date Inactive Code Status Order ID Comments User Context   12/18/2017 0453 12/20/2017 1900 Full Code [id number]  Jani Gravel, MD ED   11/13/2017 1809 11/16/2017 1512 Full Code [id number]  Eugenie Filler, MD Inpatient   11/09/2017 1744 11/12/2017 1751 Full Code [id number]  Velvet Bathe, MD ED   10/25/2017 0916 10/28/2017 1903 Full Code [id number]  Elodia Florence., MD ED   09/25/2017 1618 10/04/2017 2106 Full Code [id number]  [name], NP ED      Home/SNF/Other Home  Chief Complaint shortness of breath  Level of Care/Admitting Diagnosis ED Disposition    ED Disposition Condition [facility] Area: [facility] [[id number]]  Level of Care: Telemetry [5]  Admit to tele based on following criteria: Other see comments  Comments: PE  Diagnosis: HCAP (healthcare-associated pneumonia) [595638]  Admitting Physician: Shelly Coss [[id number]]  Attending Physician: Shelly Coss [[id number]]  Estimated length of stay: past midnight tomorrow  Certification:: I certify this patient will need inpatient services for at least 2 midnights  PT Class (Do Not Modify): Inpatient [101]  PT Acc Code (Do Not Modify): Private [1]       Medical History Past Medical History:  Diagnosis Date  . Brain cancer ([place])   . Glioblastoma ([place]) 09/25/2017  . Hyperglycemia 09/25/2017  . Seizure ([place]) 09/25/2017    Allergies Allergies  Allergen Reactions  . Pork-Derived Products     Patient does not eat pork  . Shellfish Allergy     Patient does not eat shellfish    IV Location/Drains/Wounds Patient Lines/Drains/Airways Status   Active Line/Drains/Airways    Name:   Placement date:   Placement time:    Site:   Days:   Peripheral IV 12/22/17 Right Antecubital   12/22/17    0453    Antecubital   less than 1   Peripheral IV 12/22/17 Right Wrist   12/22/17    0738    Wrist   less than 1          Labs/Imaging Results for orders placed or performed during the hospital encounter of 12/22/17 (from the past 48 hour(s))  CBC with Differential     Status: Abnormal   Collection Time: 12/22/17  3:59 AM  Result Value Ref Range   WBC 10.4 4.0 - 10.5 K/uL   RBC 3.04 (L) 4.22 - 5.81 MIL/uL   Hemoglobin 9.4 (L) 13.0 - 17.0 g/dL   HCT 28.2 (L) 39.0 - 52.0 %   MCV 92.8 78.0 - 100.0 fL   MCH 30.9 26.0 - 34.0 pg   MCHC 33.3 30.0 - 36.0 g/dL   RDW 13.9 11.5 - 15.5 %   Platelets 276 150 - 400 K/uL   Neutrophils Relative % 66 %   Neutro Abs 6.9 1.7 - 7.7 K/uL   Lymphocytes Relative 20 %   Lymphs Abs 2.0 0.7 - 4.0 K/uL   Monocytes Relative 12 %   Monocytes Absolute 1.2 (H) 0.1 - 1.0 K/uL   Eosinophils Relative 2 %   Eosinophils Absolute 0.2 0.0 - 0.7 K/uL   Basophils Relative 0 %  Basophils Absolute 0.0 0.0 - 0.1 K/uL    Comment: Performed at [facility], [place] [address] [place], [place] [address]  Basic metabolic panel     Status: Abnormal   Collection Time: 12/22/17  3:59 AM  Result Value Ref Range   Sodium 138 135 - 145 mmol/L   Potassium 3.7 3.5 - 5.1 mmol/L   Chloride 103 101 - 111 mmol/L   CO2 28 22 - 32 mmol/L   Glucose, Bld 127 (H) 65 - 99 mg/dL   BUN 13 6 - 20 mg/dL   Creatinine, Ser 0.69 0.61 - 1.24 mg/dL   Calcium 8.6 (L) 8.9 - 10.3 mg/dL   GFR calc non Af Amer >60 >60 mL/min   GFR calc Af Amer >60 >60 mL/min    Comment: (NOTE) The eGFR has been calculated using the CKD EPI equation. This calculation has not been validated in all clinical situations. eGFR's persistently <60 mL/min signify possible Chronic Kidney Disease.    Anion gap 7 5 - 15    Comment: Performed at [facility], [place] [address], [place], [place] [address]   Dg Chest 2  View  Result Date: 12/22/2017 CLINICAL DATA:  Initial evaluation for acute shortness of breath. EXAM: CHEST - 2 VIEW COMPARISON:  Prior radiograph from 12/18/2017. FINDINGS: Mild cardiomegaly. Mediastinal silhouette within normal limits. Aortic atherosclerosis. Lungs are hypoinflated. Small layering bilateral pleural effusions. Associated bibasilar opacities may reflect atelectasis and/or infiltrates, right greater than left. Perihilar vascular congestion without frank pulmonary edema. No pneumothorax. No acute osseus abnormality. IMPRESSION: 1. Low lung volumes with small bilateral pleural effusions. Associated bibasilar opacities may reflect atelectasis and/or infiltrates, right greater than left. 2. Cardiomegaly with perihilar vascular congestion without frank pulmonary edema. Electronically Signed   By: Jeannine Boga M.D.   On: 12/22/2017 04:01   Ct Angio Chest Pe W And/or Wo Contrast  Result Date: 12/22/2017 CLINICAL DATA:  Assess interval change in patient's known right-sided pulmonary embolus. Shortness of breath. EXAM: CT ANGIOGRAPHY CHEST WITH CONTRAST TECHNIQUE: Multidetector CT imaging of the chest was performed using the standard protocol during bolus administration of intravenous contrast. Multiplanar CT image reconstructions and MIPs were obtained to evaluate the vascular anatomy. CONTRAST:  167m ISOVUE-370 IOPAMIDOL (ISOVUE-370) INJECTION 76% COMPARISON:  CTA of the chest performed 12/18/2017, and chest radiograph performed earlier today at 3:46 a.m. FINDINGS: Cardiovascular: The patient's right-sided pulmonary embolus is unchanged from the prior study. However, it is better seen due to less motion artifact on the current study. There is embolus in the right main pulmonary artery, extending distally into subsegmental branches. The RV/LV ratio is now 1.9, corresponding to significant right heart strain. The heart is normal in size. Scattered calcification is noted along the aortic arch. The  great vessels are unremarkable in appearance. Mediastinum/Nodes: Enlarged mediastinal nodes are seen, measuring up to 1.6 cm in short axis at the azygoesophageal recess. No pericardial effusion is identified. The thyroid gland is diminutive and grossly unremarkable. No axillary lymphadenopathy is seen. Lungs/Pleura: Worsening right lower lobe airspace opacification is concerning for pneumonia superimposed on pulmonary infarct. A small right pleural effusion is noted. Minimal left basilar atelectasis is noted. No pneumothorax is seen. No dominant mass is identified. Upper Abdomen: The visualized portions of the liver and the spleen are unremarkable in appearance. The gallbladder is grossly unremarkable. The visualized portions of the pancreas, adrenal glands and kidneys are unremarkable. Mild bilateral perinephric stranding is seen. Musculoskeletal: No acute osseous abnormalities are identified.  Mild degenerative change is noted at the lower cervical spine. The visualized musculature is unremarkable in appearance. Review of the MIP images confirms the above findings. IMPRESSION: 1. Right-sided pulmonary embolus is unchanged from the recent prior study. However, it is better seen due to decreased motion artifact on the prior study. Embolus noted at the right main pulmonary artery, extending distally into subsegmental branches. Positive for acute PE with CT evidence of right heart strain (RV/LV Ratio = 1.9) consistent with at least submassive (intermediate risk) PE. The presence of right heart strain has been associated with an increased risk of morbidity and mortality. Please activate Code PE by paging [phone]. 2. Worsening right lower lobe airspace opacification is concerning for pneumonia superimposed on pulmonary infarct. Small right pleural effusion noted. 3. Enlarged mediastinal nodes, measuring up to 1.6 cm in short axis at the azygoesophageal recess. This could reflect underlying infection. Critical  Value/emergent results were called by telephone at the time of interpretation on 12/22/2017 at 6:10 am to [facility], who verbally acknowledged these results. Electronically Signed   By: Garald Balding M.D.   On: 12/22/2017 06:17    Pending Labs Unresulted Labs (From admission, onward)   Start     Ordered   12/23/17 3112  Basic metabolic panel  Tomorrow morning,   R     12/22/17 0816   12/23/17 0500  CBC  Tomorrow morning,   R     12/22/17 0816      Vitals/Pain Today's Vitals   12/22/17 0700 12/22/17 0730 12/22/17 0800 12/22/17 0830  BP: 132/81 (!) 128/96 131/85 (!) 141/77  Pulse: 75 80 80 81  Resp: 18 (!) 25 (!) 26 (!) 21  Temp:      TempSrc:      SpO2: 99% 99% 98% 95%  Weight:      Height:      PainSc:        Isolation Precautions No active isolations  Medications Medications  vancomycin (VANCOCIN) 1,250 mg in sodium chloride 0.9 % 250 mL IVPB (1,250 mg Intravenous New Bag/Given 12/22/17 0741)  apixaban (ELIQUIS) tablet 10 mg (has no administration in time range)  levETIRAcetam (KEPPRA) tablet 1,000 mg (has no administration in time range)  ceFEPIme (MAXIPIME) 2 g in sodium chloride 0.9 % 100 mL IVPB (has no administration in time range)  vancomycin (VANCOCIN) IVPB 750 mg/150 ml premix (has no administration in time range)  iopamidol (ISOVUE-370) 76 % injection 100 mL (100 mLs Intravenous Contrast Given 12/22/17 0542)  ceFEPIme (MAXIPIME) 2 g in sodium chloride 0.9 % 100 mL IVPB (0 g Intravenous Stopped 12/22/17 0743)    Mobility walks with device

## 2017-12-23 LAB — BASIC METABOLIC PANEL
ANION GAP: 7 (ref 5–15)
BUN: 10 mg/dL (ref 6–20)
CHLORIDE: 103 mmol/L (ref 101–111)
CO2: 28 mmol/L (ref 22–32)
Calcium: 8.5 mg/dL — ABNORMAL LOW (ref 8.9–10.3)
Creatinine, Ser: 0.63 mg/dL (ref 0.61–1.24)
GFR calc Af Amer: >60 mL/min (ref >60)
GLUCOSE: 146 mg/dL — AB (ref 65–99)
Potassium: 3.6 mmol/L (ref 3.5–5.1)
Sodium: 138 mmol/L (ref 135–145)

## 2017-12-23 LAB — CULTURE, BLOOD (ROUTINE X 2)
CULTURE: NO GROWTH
Culture: NO GROWTH
SPECIAL REQUESTS: ADEQUATE
Special Requests: ADEQUATE

## 2017-12-23 LAB — CBC
HCT: 26.5 % — ABNORMAL LOW (ref 39.0–52.0)
HEMOGLOBIN: 8.8 g/dL — AB (ref 13.0–17.0)
MCH: 30.4 pg (ref 26.0–34.0)
MCHC: 33.2 g/dL (ref 30.0–36.0)
MCV: 91.7 fL (ref 78.0–100.0)
Platelets: 273 10*3/uL (ref 150–400)
RBC: 2.89 MIL/uL — ABNORMAL LOW (ref 4.22–5.81)
RDW: 13.9 % (ref 11.5–15.5)
WBC: 10.2 10*3/uL (ref 4.0–10.5)

## 2017-12-23 MED ORDER — AZITHROMYCIN 250 MG PO TABS
500.0000 mg | ORAL_TABLET | Freq: Every day | ORAL | Status: DC
  Administered 2017-12-23: 500 mg via ORAL
  Filled 2017-12-23: qty 2

## 2017-12-23 MED ORDER — CEFDINIR 300 MG PO CAPS
300.0000 mg | ORAL_CAPSULE | Freq: Two times a day (BID) | ORAL | Status: DC
  Administered 2017-12-23 – 2017-12-24 (×2): 300 mg via ORAL
  Filled 2017-12-23 (×2): qty 1

## 2017-12-23 MED ORDER — APIXABAN 5 MG PO TABS: 5.0000 mg | ORAL_TABLET | Freq: Two times a day (BID) | ORAL | Status: DC

## 2017-12-23 NOTE — Progress Notes (Signed)
[facility] Progress Note  Patient: Christopher Morris UKG:[id number]   PCP: Patient, No Pcp Per DOB: [birth year]   DOA: 12/22/2017   DOS: 12/23/2017   Date of Service: the patient was seen and examined on 12/23/2017  Subjective: Reports pleuritic chest pain on the right.  No nausea no vomiting.  No chest pain at the time of my evaluation.  No diarrhea no constipation no active bleeding. Discussed with patient's daughter who is at bedside, patient after leaving the hospital was able to get his medication for anticoagulation but did not receive his antibiotics from the pharmacy.  I called the pharmacy to verify the events, they needed additional information regarding azithromycin prescription and sent a fax although I do not see a fax on the floor and for Vantin they mentions that they need insurance information.  Brief hospital course: Pt. with PMH of GBM, s/p craniotomy 09/30/2017, s/p XRT, seizures, vitamin b12 deficiency, Anemia; admitted on 12/22/2017, presented with complaint of shortness of breath, was found to have a persistent healthcare associated pneumonia. Currently further plan is to switch antibiotics from IV to oral again  Assessment and Plan: 1.  Acute pulmonary embolism. Acute left common femoral vein SF J femoral vein profunda vein popliteal vein posterior tibial vein and peroneal vein DVT. Also right SSV thrombosis. Discussed with neuro oncology last admission, patient was started on Eliquis. Repeat CT scan this admission shows no change on PE size. RV strain is observed at this time although echocardiogram last admission did not show any significant abnormality of right heart strain. Troponin x3 were negative as well. Vision remains on room air as well as remains hemodynamically stable as well. No indication for TPA at present.  Definitely not a candidate as well due to his history of CNS malignancy  2.  Recently treated health procedure pneumonia. Cultures so far remain  negative from last admission. Started back on IV cefepime and vancomycin. We will transition him back on oral Omnicef and azithromycin. Will verify with pharmacy prior to discharge regarding medication coverage.  3.  GBM. S/P radiation and steroids.  Continue at home he with resection on 09/30/2017 Neuro oncology considering possible chemotherapy. Outpatient follow-up.  4.  Diffuse macular papular rash. Seen last admission, currently appears to have resolved. Treated with Decadron and calamine lotion.  6.  Seizure disorder. Secondary to GBM. Completed Decadron therapy recently as an outpatient around Nov 27, 2017. On Keppra 1000 mg twice daily, dose recently increased neuro-oncology.  Diet: Regular diet  Advance goals of care discussion: full code  Family Communication: family was present at bedside, at the time of interview. The pt provided permission to discuss medical plan with the family. Opportunity was given to ask question and all questions were answered satisfactorily.   Disposition:  Discharge to home.  Consultants: none Procedures: Echocardiogram   Antibiotics: Anti-infectives (From admission, onward)   Start     Dose/Rate Route Frequency Ordered Stop   12/23/17 1545  cefdinir (OMNICEF) capsule 300 mg     300 mg Oral Every 12 hours 12/23/17 1540     12/23/17 1545  azithromycin (ZITHROMAX) tablet 500 mg     500 mg Oral Daily 12/23/17 1540     12/22/17 1500  ceFEPIme (MAXIPIME) 2 g in sodium chloride 0.9 % 100 mL IVPB  Status:  Discontinued     2 g 200 mL/hr over 30 Minutes Intravenous Every 8 hours 12/22/17 0835 12/23/17 1539   12/22/17 1500  vancomycin (VANCOCIN) IVPB 750 mg/150 ml  premix     750 mg 150 mL/hr over 60 Minutes Intravenous Every 8 hours 12/22/17 0835     12/22/17 0645  vancomycin (VANCOCIN) 1,250 mg in sodium chloride 0.9 % 250 mL IVPB     1,250 mg 166.7 mL/hr over 90 Minutes Intravenous  Once 12/22/17 0636 12/22/17 0915   12/22/17 0630  ceFEPIme  (MAXIPIME) 2 g in sodium chloride 0.9 % 100 mL IVPB     2 g 200 mL/hr over 30 Minutes Intravenous  Once 12/22/17 0628 12/22/17 0743       Objective: Physical Exam: Vitals:   12/22/17 1458 12/22/17 2133 12/23/17 0546 12/23/17 1311  BP: (!) 141/77 128/68 122/70 124/74  Pulse: 97 85 90 79  Resp: 18 18 20 16   Temp: 99.4 F (37.4 C) 97.8 F (36.6 C) (!) 100.8 F (38.2 C) 98.9 F (37.2 C)  TempSrc: Oral Oral Oral Oral  SpO2: 96% 96% 95% 99%  Weight:      Height:        Intake/Output Summary (Last 24 hours) at 12/23/2017 1540 Last data filed at 12/23/2017 1509 Gross per 24 hour  Intake 1110 ml  Output 1800 ml  Net -690 ml   Filed Weights   12/22/17 0159 12/22/17 0918  Weight: 88.2 kg (194 lb 8 oz) 87.6 kg (193 lb 2 oz)   General: Alert, Awake and Oriented to Time, Place and Person. Appear in mild distress, affect appropriate Eyes: PERRL, Conjunctiva normal ENT: Oral Mucosa clear moist. Neck: no JVD, no Abnormal Mass Or lumps Cardiovascular: S1 and S2 Present, no Murmur, Peripheral Pulses Present Respiratory: normal respiratory effort, Bilateral Air entry equal and Decreased, no use of accessory muscle, right basal crackles, no wheezes Abdomen: Bowel Sound present, Soft and no tenderness, no hernia Skin: no redness, no Rash, no induration Extremities: no Pedal edema, no calf tenderness Neurologic: Grossly no focal neuro deficit. Bilaterally Equal motor strength  Data Reviewed: CBC: Recent Labs  Lab 12/18/17 0117 12/18/17 0659 12/19/17 0438 12/22/17 0359 12/23/17 0436  WBC 11.4* 10.5 11.8* 10.4 10.2  NEUTROABS 8.0*  --  9.6* 6.9  --   HGB 10.0* 9.8* 8.6* 9.4* 8.8*  HCT 31.0* 30.1* 26.0* 28.2* 26.5*  MCV 94.8 94.4 92.9 92.8 91.7  PLT 231 205 192 276 258   Basic Metabolic Panel: Recent Labs  Lab 12/18/17 0131 12/18/17 0659 12/19/17 0438 12/22/17 0359 12/23/17 0436  NA 139 140 140 138 138  K 3.4* 3.5 3.9 3.7 3.6  CL 104 106 109 103 103  CO2 26 24 24 28 28     GLUCOSE 138* 153* 197* 127* 146*  BUN 8 8 10 13 10   CREATININE 0.82 0.76 0.71 0.69 0.63  CALCIUM 9.2 8.6* 8.6* 8.6* 8.5*  MG  --   --  1.8  --   --     Liver Function Tests: Recent Labs  Lab 12/18/17 0131 12/18/17 0659 12/19/17 0438  AST 15 19 16   ALT 13* 12* 15*  ALKPHOS 95 79 67  BILITOT 0.8 0.9 0.9  PROT 7.3 6.3* 6.2*  ALBUMIN 4.0 3.3* 3.0*   No results for input(s): LIPASE, AMYLASE in the last 168 hours. No results for input(s): AMMONIA in the last 168 hours. Coagulation Profile: Recent Labs  Lab 12/19/17 0438  INR 1.53   Cardiac Enzymes: Recent Labs  Lab 12/18/17 0658 12/18/17 1051 12/18/17 1630  TROPONINI <0.03 <0.03 <0.03   BNP (last 3 results) No results for input(s): PROBNP in the last 8760 hours.  CBG: No results for input(s): GLUCAP in the last 168 hours. Studies: No results found.  Scheduled Meds: . apixaban  10 mg Oral BID  . [START ON 12/25/2017] apixaban  5 mg Oral BID  . azithromycin  500 mg Oral Daily  . cefdinir  300 mg Oral Q12H  . levETIRAcetam  1,000 mg Oral BID   Continuous Infusions: . vancomycin Stopped (12/23/17 1609)   PRN Meds: acetaminophen, ipratropium-albuterol  Time spent: 35 minutes  Author: Berle Mull, MD [facility] Pager: [phone] 12/23/2017 3:40 PM  If 7PM-7AM, please contact night-coverage at [email or web address], password [facility]

## 2017-12-23 NOTE — Progress Notes (Signed)
Provided patient with incentive spirometer per MD orders. Educated patient on use. Patient demonstrated understanding. Patient only able to raise float up to 500 a few times but mostly was only able to get it as high as 250. Patient states that taking the deep breathes caused his chest to hurt as described to the MD this afternoon. Reeducated the patient on discussion had with MD about the need to take deeper breaths to keep his lungs expanded to avoid worsening pneumonia. Patient did not demonstrate the ability to take a good deep breath at this time. Will continue to encourage IS use to improve lung volumes.

## 2017-12-24 LAB — CBC WITH DIFFERENTIAL/PLATELET
Basophils Absolute: 0 10*3/uL (ref 0.0–0.1)
Basophils Relative: 0 %
EOS PCT: 14 %
Eosinophils Absolute: 1.7 10*3/uL — ABNORMAL HIGH (ref 0.0–0.7)
HCT: 26.5 % — ABNORMAL LOW (ref 39.0–52.0)
Hemoglobin: 8.9 g/dL — ABNORMAL LOW (ref 13.0–17.0)
LYMPHS ABS: 1.7 10*3/uL (ref 0.7–4.0)
Lymphocytes Relative: 15 %
MCH: 30.9 pg (ref 26.0–34.0)
MCHC: 33.6 g/dL (ref 30.0–36.0)
MCV: 92 fL (ref 78.0–100.0)
MONO ABS: 1 10*3/uL (ref 0.1–1.0)
Monocytes Relative: 9 %
Neutro Abs: 7.4 10*3/uL (ref 1.7–7.7)
Neutrophils Relative %: 62 %
PLATELETS: 299 10*3/uL (ref 150–400)
RBC: 2.88 MIL/uL — AB (ref 4.22–5.81)
RDW: 14.2 % (ref 11.5–15.5)
WBC: 11.8 10*3/uL — AB (ref 4.0–10.5)

## 2017-12-24 LAB — BASIC METABOLIC PANEL
Anion gap: 6 (ref 5–15)
BUN: 8 mg/dL (ref 6–20)
CALCIUM: 8.4 mg/dL — AB (ref 8.9–10.3)
CO2: 29 mmol/L (ref 22–32)
CREATININE: 0.69 mg/dL (ref 0.61–1.24)
Chloride: 104 mmol/L (ref 101–111)
GFR calc Af Amer: >60 mL/min (ref >60)
GLUCOSE: 147 mg/dL — AB (ref 65–99)
Potassium: 3.5 mmol/L (ref 3.5–5.1)
SODIUM: 139 mmol/L (ref 135–145)

## 2017-12-24 MED ORDER — ACETAMINOPHEN-CODEINE #3 300-30 MG PO TABS: 1.0000 | ORAL_TABLET | Freq: Three times a day (TID) | ORAL | Status: DC | PRN

## 2017-12-24 MED ORDER — AZITHROMYCIN 500 MG PO TABS: 500.0000 mg | ORAL_TABLET | Freq: Every day | ORAL | 0 refills | Status: AC

## 2017-12-24 MED ORDER — GUAIFENESIN ER 600 MG PO TB12: 600.0000 mg | ORAL_TABLET | Freq: Two times a day (BID) | ORAL | Status: DC

## 2017-12-24 MED ORDER — CEFPODOXIME PROXETIL 200 MG PO TABS: 200.0000 mg | ORAL_TABLET | Freq: Two times a day (BID) | ORAL | 0 refills | Status: AC

## 2017-12-24 MED ORDER — ACETAMINOPHEN-CODEINE #3 300-30 MG PO TABS
1.0000 | ORAL_TABLET | Freq: Three times a day (TID) | ORAL | Status: DC | PRN
Start: 2017-12-24 — End: 2017-12-24

## 2017-12-24 MED ORDER — GUAIFENESIN ER 600 MG PO TB12: 600.0000 mg | ORAL_TABLET | Freq: Two times a day (BID) | ORAL | 0 refills | Status: DC

## 2017-12-24 MED ORDER — ACETAMINOPHEN-CODEINE #3 300-30 MG PO TABS: 1.0000 | ORAL_TABLET | Freq: Three times a day (TID) | ORAL | 0 refills | Status: DC | PRN

## 2017-12-24 NOTE — Progress Notes (Signed)
Patient ambulated in hallway about 456ft. O2 sats at rest on RA 94%. O2 sats on RA while ambulating 92-94%. Patient tolerated ambulation well with mild dyspnea. HR sustained about 111 with ambulation.

## 2017-12-24 NOTE — Progress Notes (Signed)
Patient is stable for discharge. Discharge instructions and medications have been reviewed with the patient's grandson and all questions answered. AVS and prescriptions given to the grandson. Grandson transported the patient home.   Othella Boyer [facility] 12/24/2017 1:55 PM

## 2017-12-24 NOTE — Care Management Note (Signed)
Case Management Note  Patient Details  Name: [name] MRN: [id number] Date of Birth: [birth year]  Subjective/Objective:  Pt admitted with HCAP, readmitted related to pt's Pharmacy                  Action/Plan: Plan to discharge home and follow up with the [facility] for [facility] needs.    Expected Discharge Date:                  Expected Discharge Plan:  Home/Self Care  In-House Referral:     Discharge planning Services  CM Consult  Post Acute Care Choice:    Choice offered to:  Patient, Adult Children  DME Arranged:    DME Agency:     HH Arranged:    [place] Agency:     Status of Service:  Completed, signed off  If discussed at [place] of Stay Meetings, dates discussed:    Additional CommentsPurcell Mouton, RN 12/24/2017, 11:35 AM

## 2017-12-24 NOTE — Discharge Instructions (Signed)

## 2017-12-24 NOTE — Discharge Summary (Signed)
[facility] Discharge Summary   Patient: Christopher Morris KXF:[id number]   PCP: Patient, No Pcp Per DOB: 1940-11-18   Date of admission: 12/22/2017   Date of discharge: 12/24/2017    Discharge Diagnoses:  Principal Problem:   HCAP (healthcare-associated pneumonia) Active Problems:   Seizure ([place])   Frontal glioblastoma multiforme ([place])   Pulmonary embolism ([place])   Admitted From: home Disposition:  home  Recommendations for Outpatient Follow-up:  1. Follow-up with PCP in 1 week.  Follow-up Information    [facility] Schedule an appointment as soon as possible for a visit in 1 week(s).   Why:  still needs prescription for apixaban and home health PT RN         Diet recommendation: Cardiac diet  Activity: The patient is advised to gradually reintroduce usual activities.  Discharge Condition: good  Code Status: Full code  History of present illness: As per the H and P dictated on admission, "Christopher Morris is a 77 y.o. male with medical history significant of glioblastoma multiforme, status post craniotomy on 09/30/2017, status post radiotherapy, seizure disorder, anemia who presented to the emergency department from home with complaints of shortness of breath.  He said last night when he went to bed he started having shortness of breath and he has to breath very fast. patient was also complaining of upper abdominal pain and said he had similar complain when he was admitted last time and was found to have pneumonia.  Patient was admitted and discharged on 12/20/2017 after being managed for acute pulmonary embolism, acute DVT and pneumonia.  Patient says he did not feel well after going home.  He was taking the antibiotics as prescribed.  He continued to feel short of breath.  Patient is a poor historian.  He has  underlying dementia. He has memory problems. Patient seen and examined the bedside in the emergency department.  He appeared slightly tachypnea.  He did not report any fever,  chills, chest pain, dysuria, nausea, vomiting, diarrhea or headache.   [facility] was called for admission.  CT chest showed worsening right left lower airspace opacification consistent for pneumonia."  Hospital Course:  Summary of his active problems in the hospital is as following. 1.  Acute pulmonary embolism. Acute left common femoral vein SF J femoral vein profunda vein popliteal vein posterior tibial vein and peroneal vein DVT. Also right SSV thrombosis. Discussed with neuro oncology last admission, patient was started on Eliquis. Repeat CT scan this admission shows no change on PE size. RV strain is observed at this time although echocardiogram last admission did not show any significant abnormality of right heart strain. Troponin x3 were negative as well. Vision remains on room air as well as remains hemodynamically stable as well. Discussed with pulmonary, no indication for TPA at present.  Definitely not a candidate as well due to his history of CNS malignancy  2.  Recently treated health procedure pneumonia. Cultures so far remain negative from last admission. Started back on IV cefepime and vancomycin. We will transition him back on oral Omnicef-Vantin on discharge and azithromycin. Discussed with pharmacy, for some reason azithromycin prescription was sent back for clarity of information although we did not receive any communication from the pharmacy and Ventolin prescription was sent back because pharmacy was waiting for insurance information from patient. Case manager was consulted to ensure that the patient gets these medications on discharge.  Do not think that any further change in coverage is needed at present.  3.  GBM. S/P radiation and steroids.  Continue at home he with resection on 09/30/2017 Neuro oncology considering possible chemotherapy. Outpatient follow-up.  4.  Diffuse macular papular rash. Seen last admission, currently appears to have  resolved. Treated with Decadron and calamine lotion.  6.  Seizure disorder. Secondary to GBM. Completed Decadron therapy recently as an outpatient around Nov 27, 2017. On Keppra 1000 mg twice daily, dose recently increased neuro-oncology.  All other chronic medical condition were stable during the hospitalization.  On the day of the discharge the patient's vitals were stable , and no other acute medical condition were reported by patient. the patient was felt safe to be discharge at home with home health.  Consultants: none Procedures: none  DISCHARGE MEDICATION: Allergies as of 12/24/2017      Reactions   Pork-derived Products    Patient does not eat pork   Shellfish Allergy    Patient does not eat shellfish      Medication List    TAKE these medications   acetaminophen-codeine 300-30 MG tablet Commonly known as:  TYLENOL #3 Take 1 tablet by mouth every 8 (eight) hours as needed for moderate pain or severe pain (cough).   apixaban 5 MG Tabs tablet Commonly known as:  ELIQUIS Take 10 mg BID till 12/24/2017, start taking 5 mg BID from 12/25/2017   azithromycin 500 MG tablet Commonly known as:  ZITHROMAX Take 1 tablet (500 mg total) by mouth daily for 5 days. What changed:  additional instructions   cefpodoxime 200 MG tablet Commonly known as:  VANTIN Take 1 tablet (200 mg total) by mouth 2 (two) times daily for 5 days.   cyanocobalamin 1000 MCG/ML injection Commonly known as:  (VITAMIN B-12) Inject 1 mL (1,000 mcg total) into the muscle daily. Take Vitamin B12 1062mcg IM daily x 5 days, then vitamin B12 1028mcg IM weekly, then vitamin B12 1040mcg monthly.   guaiFENesin 600 MG 12 hr tablet Commonly known as:  MUCINEX Take 1 tablet (600 mg total) by mouth 2 (two) times daily.   hydrocortisone cream 1 % Apply 1 application topically 2 (two) times daily as needed for itching.   levETIRAcetam 1000 MG tablet Commonly known as:  KEPPRA Take 1 tablet (1,000 mg total) by  mouth 2 (two) times daily.   SYRINGE-NEEDLE (DISP) 5 ML 21G X 1-1/2" 5 ML Misc Commonly known as:  SAFETY SYRINGES/NEEDLE 1,000 mcg by Does not apply route daily.      Allergies  Allergen Reactions  . Pork-Derived Products     Patient does not eat pork  . Shellfish Allergy     Patient does not eat shellfish   Discharge Instructions    Diet - low sodium heart healthy   Complete by:  As directed    Discharge instructions   Complete by:  As directed    It is important that you read following instructions as well as go over your medication list with RN to help you understand your care after this hospitalization.  Discharge Instructions:  Please follow-up with PCP in one week  Please request your primary care physician to go over all Hospital Tests and Procedure/Radiological results at the follow up,  Please get all Hospital records sent to your PCP by signing hospital release before you go home.   Do not drive, operating heavy machinery, perform activities at heights, swimming or participation in water activities or provide baby sitting services; until you have been seen by Primary Care Physician or a Neurologist and  advised to do so again. Do not take more than prescribed Pain, Sleep and Anxiety Medications. You were cared for by a hospitalist during your hospital stay. If you have any questions about your discharge medications or the care you received while you were in the hospital after you are discharged, you can call the unit and ask to speak with the hospitalist on call if the hospitalist that took care of you is not available.  Once you are discharged, your primary care physician will handle any further medical issues. Please note that NO REFILLS for any discharge medications will be authorized once you are discharged, as it is imperative that you return to your primary care physician (or establish a relationship with a primary care physician if you do not have one) for your  aftercare needs so that they can reassess your need for medications and monitor your lab values. You Must read complete instructions/literature along with all the possible adverse reactions/side effects for all the Medicines you take and that have been prescribed to you. Take any new Medicines after you have completely understood and accept all the possible adverse reactions/side effects. Wear Seat belts while driving.   Increase activity slowly   Complete by:  As directed      Discharge Exam: Filed Weights   12/22/17 0159 12/22/17 0918  Weight: 88.2 kg (194 lb 8 oz) 87.6 kg (193 lb 2 oz)   Vitals:   12/23/17 2150 12/24/17 0613  BP: 135/68 112/61  Pulse: 90 69  Resp: 18 20  Temp: 98.9 F (37.2 C) 98.3 F (36.8 C)  SpO2: 98% 100%   General: Appear in no distress, no Rash; Oral Mucosa moist. Cardiovascular: S1 and S2 Present, no Murmur, no JVD Respiratory: Bilateral Air entry present and Clear to Auscultation, no Crackles, no wheezes Abdomen: Bowel Sound present, Soft and no tenderness Extremities: no Pedal edema, no calf tenderness Neurology: Grossly no focal neuro deficit.  The results of significant diagnostics from this hospitalization (including imaging, microbiology, ancillary and laboratory) are listed below for reference.    Significant Diagnostic Studies: Dg Chest 2 View  Result Date: 12/22/2017 CLINICAL DATA:  Initial evaluation for acute shortness of breath. EXAM: CHEST - 2 VIEW COMPARISON:  Prior radiograph from 12/18/2017. FINDINGS: Mild cardiomegaly. Mediastinal silhouette within normal limits. Aortic atherosclerosis. Lungs are hypoinflated. Small layering bilateral pleural effusions. Associated bibasilar opacities may reflect atelectasis and/or infiltrates, right greater than left. Perihilar vascular congestion without frank pulmonary edema. No pneumothorax. No acute osseus abnormality. IMPRESSION: 1. Low lung volumes with small bilateral pleural effusions. Associated  bibasilar opacities may reflect atelectasis and/or infiltrates, right greater than left. 2. Cardiomegaly with perihilar vascular congestion without frank pulmonary edema. Electronically Signed   By: Jeannine Boga M.D.   On: 12/22/2017 04:01   Dg Chest 2 View  Result Date: 12/18/2017 CLINICAL DATA:  Acute onset of shortness of breath. EXAM: CHEST - 2 VIEW COMPARISON:  Chest radiograph performed 11/13/2017 FINDINGS: The lungs are hypoexpanded. Mild bibasilar atelectasis is noted. No definite pleural effusion or pneumothorax is seen. The heart is mildly enlarged. No acute osseous abnormalities are seen. IMPRESSION: Lungs hypoexpanded, with mild bibasilar atelectasis. Mild cardiomegaly. Electronically Signed   By: Garald Balding M.D.   On: 12/18/2017 02:28   Ct Angio Chest Pe W And/or Wo Contrast  Result Date: 12/22/2017 CLINICAL DATA:  Assess interval change in patient's known right-sided pulmonary embolus. Shortness of breath. EXAM: CT ANGIOGRAPHY CHEST WITH CONTRAST TECHNIQUE: Multidetector CT imaging of the  chest was performed using the standard protocol during bolus administration of intravenous contrast. Multiplanar CT image reconstructions and MIPs were obtained to evaluate the vascular anatomy. CONTRAST:  117mL ISOVUE-370 IOPAMIDOL (ISOVUE-370) INJECTION 76% COMPARISON:  CTA of the chest performed 12/18/2017, and chest radiograph performed earlier today at 3:46 a.m. FINDINGS: Cardiovascular: The patient's right-sided pulmonary embolus is unchanged from the prior study. However, it is better seen due to less motion artifact on the current study. There is embolus in the right main pulmonary artery, extending distally into subsegmental branches. The RV/LV ratio is now 1.9, corresponding to significant right heart strain. The heart is normal in size. Scattered calcification is noted along the aortic arch. The great vessels are unremarkable in appearance. Mediastinum/Nodes: Enlarged mediastinal nodes are  seen, measuring up to 1.6 cm in short axis at the azygoesophageal recess. No pericardial effusion is identified. The thyroid gland is diminutive and grossly unremarkable. No axillary lymphadenopathy is seen. Lungs/Pleura: Worsening right lower lobe airspace opacification is concerning for pneumonia superimposed on pulmonary infarct. A small right pleural effusion is noted. Minimal left basilar atelectasis is noted. No pneumothorax is seen. No dominant mass is identified. Upper Abdomen: The visualized portions of the liver and the spleen are unremarkable in appearance. The gallbladder is grossly unremarkable. The visualized portions of the pancreas, adrenal glands and kidneys are unremarkable. Mild bilateral perinephric stranding is seen. Musculoskeletal: No acute osseous abnormalities are identified. Mild degenerative change is noted at the lower cervical spine. The visualized musculature is unremarkable in appearance. Review of the MIP images confirms the above findings. IMPRESSION: 1. Right-sided pulmonary embolus is unchanged from the recent prior study. However, it is better seen due to decreased motion artifact on the prior study. Embolus noted at the right main pulmonary artery, extending distally into subsegmental branches. Positive for acute PE with CT evidence of right heart strain (RV/LV Ratio = 1.9) consistent with at least submassive (intermediate risk) PE. The presence of right heart strain has been associated with an increased risk of morbidity and mortality. Please activate Code PE by paging [phone]. 2. Worsening right lower lobe airspace opacification is concerning for pneumonia superimposed on pulmonary infarct. Small right pleural effusion noted. 3. Enlarged mediastinal nodes, measuring up to 1.6 cm in short axis at the azygoesophageal recess. This could reflect underlying infection. Critical Value/emergent results were called by telephone at the time of interpretation on 12/22/2017 at 6:10 am  to [facility] PA, who verbally acknowledged these results. Electronically Signed   By: Garald Balding M.D.   On: 12/22/2017 06:17   Ct Angio Chest Pe W Or Wo Contrast  Result Date: 12/18/2017 CLINICAL DATA:  Shortness of breath and fever EXAM: CT ANGIOGRAPHY CHEST WITH CONTRAST TECHNIQUE: Multidetector CT imaging of the chest was performed using the standard protocol during bolus administration of intravenous contrast. Multiplanar CT image reconstructions and MIPs were obtained to evaluate the vascular anatomy. CONTRAST:  24mL ISOVUE-370 IOPAMIDOL (ISOVUE-370) INJECTION 76% COMPARISON:  Chest CT September 25, 2017; chest radiograph December 18, 2017 FINDINGS: Cardiovascular: There are multiple pulmonary emboli throughout the right lower lobe pulmonary arterial system. No pulmonary emboli are noted to the left of midline or in either main pulmonary artery. The right ventricle to left ventricle diameter ratio is less than 0.9, not consistent with right heart strain. There is no appreciable thoracic aortic aneurysm or dissection. Visualized great vessels appear unremarkable. There is aortic atherosclerosis. There are occasional foci coronary artery calcification. There is no pericardial effusion or pericardial thickening  evident. Mediastinum/Nodes: Thyroid appears normal. There is no appreciable adenopathy. There are scattered subcentimeter lymph nodes which do not meet size criteria for pathologic significance. No esophageal lesions are evident. Lungs/Pleura: There is patchy airspace consolidation in portions of the lateral and posterior segments of the right lower lobe. There is a small right pleural effusion. No pulmonary infarct is demonstrable. There is bibasilar atelectasis as well. Upper Abdomen: Visualized upper abdominal structures appear normal. Musculoskeletal: There is degenerative change in thoracic spine. There are no blastic or lytic bone lesions. There is a degree of gynecomastia bilaterally. Review of  the MIP images confirms the above findings. IMPRESSION: 1. Multiple right lower lobe pulmonary artery branch emboli. No more central pulmonary embolus. No pulmonary embolus to the left of midline. No right heart strain evident. 2. Small left pleural effusion with areas of airspace consolidation in portions of the lateral and posterior segments of the right lower lobe, likely pneumonia. No wedge-shaped defect to suggest pulmonary infarct appreciable. There is bibasilar atelectasis. 3. Aortic atherosclerosis. Foci coronary artery calcification noted. No thoracic aortic aneurysm or dissection. 4.  No demonstrable thoracic adenopathy. 5.  Mild gynecomastia bilaterally. Aortic Atherosclerosis (ICD10-I70.0). Critical Value/emergent results were called by telephone at the time of interpretation on 12/18/2017 at 10:46 am to Dr. Berle Mull , who verbally acknowledged these results. Electronically Signed   By: Lowella Grip III M.D.   On: 12/18/2017 10:46   Dg Abd 2 Views  Result Date: 12/18/2017 CLINICAL DATA:  Abdominal pain EXAM: ABDOMEN - 2 VIEW COMPARISON:  None. FINDINGS: The bowel gas pattern is normal. There is no evidence of free air. No radio-opaque calculi or other significant radiographic abnormality is seen. IMPRESSION: Negative. Electronically Signed   By: Ulyses Jarred M.D.   On: 12/18/2017 05:43    Microbiology: Recent Results (from the past 240 hour(s))  Blood Culture (routine x 2)     Status: None   Collection Time: 12/18/17  1:31 AM  Result Value Ref Range Status   Specimen Description   Final    BLOOD RIGHT ANTECUBITAL Performed at [facility] [address], [place], [place] [address]    Special Requests   Final    BOTTLES DRAWN AEROBIC AND ANAEROBIC Blood Culture adequate volume Performed at [facility] [address] [place], [place] [address]    Culture   Final    NO GROWTH 5 DAYS Performed at [facility], [place] [address]  [place], [place] [address]    Report Status 12/23/2017 FINAL  Final  Blood Culture (routine x 2)     Status: None   Collection Time: 12/18/17  1:50 AM  Result Value Ref Range Status   Specimen Description   Final    BLOOD LEFT HAND Performed at [facility] [address], [place], [place] [address]    Special Requests   Final    BOTTLES DRAWN AEROBIC AND ANAEROBIC Blood Culture adequate volume Performed at [facility] [address] [place], [place] [address]    Culture   Final    NO GROWTH 5 DAYS Performed at [facility], [place] [address] [place], [place] [address]    Report Status 12/23/2017 FINAL  Final  Urine culture     Status: None   Collection Time: 12/18/17  2:27 AM  Result Value Ref Range Status   Specimen Description   Final    URINE, CLEAN CATCH Performed at [facility], [place] [address], [place], [place] [address]    Special Requests   Final    Immunocompromised Performed at [facility], [place] [address] [place], [place] [address]    Culture   Final    NO GROWTH Performed at [facility], [place] [address] [place], [place] [address]    Report Status 12/19/2017 FINAL  Final  Respiratory Panel by PCR     Status: None   Collection Time: 12/18/17  6:13 AM  Result Value Ref Range Status   Adenovirus NOT DETECTED NOT DETECTED Final   Coronavirus 229E NOT DETECTED NOT DETECTED Final   Coronavirus HKU1 NOT DETECTED NOT DETECTED Final   Coronavirus NL63 NOT DETECTED NOT DETECTED Final   Coronavirus OC43 NOT DETECTED NOT DETECTED Final   Metapneumovirus NOT DETECTED NOT DETECTED Final   Rhinovirus / Enterovirus NOT DETECTED NOT DETECTED Final   Influenza A NOT DETECTED NOT DETECTED Final   Influenza B NOT DETECTED NOT DETECTED Final   Parainfluenza Virus 1 NOT DETECTED NOT DETECTED Final   Parainfluenza Virus 2 NOT DETECTED NOT DETECTED Final   Parainfluenza Virus 3 NOT DETECTED NOT  DETECTED Final   Parainfluenza Virus 4 NOT DETECTED NOT DETECTED Final   Respiratory Syncytial Virus NOT DETECTED NOT DETECTED Final   Bordetella pertussis NOT DETECTED NOT DETECTED Final   Chlamydophila pneumoniae NOT DETECTED NOT DETECTED Final   Mycoplasma pneumoniae NOT DETECTED NOT DETECTED Final    Comment: Performed at [facility], [place] [address], [place], [place] [address]  MRSA PCR Screening     Status: None   Collection Time: 12/18/17  3:39 PM  Result Value Ref Range Status   MRSA by PCR NEGATIVE NEGATIVE Final    Comment:        The GeneXpert MRSA Assay (FDA approved for NASAL specimens only), is one component of a comprehensive MRSA colonization surveillance program. It is not intended to diagnose MRSA infection nor to guide or monitor treatment for MRSA infections. Performed at [facility], [place] [address], [place], [place] [address]   Culture, blood (routine x 2)     Status: None (Preliminary result)   Collection Time: 12/22/17 10:46 AM  Result Value Ref Range Status   Specimen Description   Final    BLOOD LEFT ANTECUBITAL Performed at [facility] [address] [place], [place] [address]    Special Requests   Final    BOTTLES DRAWN AEROBIC ONLY Blood Culture adequate volume Performed at [facility] [address] [place], [place] [address]    Culture   Final    NO GROWTH 2 DAYS Performed at [facility] [address] [place], [place] [address]    Report Status PENDING  Incomplete  Culture, blood (routine x 2)     Status: None (Preliminary result)   Collection Time: 12/22/17 10:53 AM  Result Value Ref Range Status   Specimen Description   Final    BLOOD LEFT ANTECUBITAL Performed at [facility] [address] [place], [place] [address]    Special Requests   Final    BOTTLES DRAWN AEROBIC AND ANAEROBIC Blood Culture adequate volume Performed at [facility] [address], [place], [place] [address]    Culture   Final    NO GROWTH 2 DAYS Performed at [facility] [address] [place], [place] [address]    Report  Status PENDING  Incomplete     Labs: CBC: Recent Labs  Lab 12/18/17 0117 12/18/17 0659 12/19/17 0438 12/22/17 0359 12/23/17 0436 12/24/17 0409  WBC 11.4* 10.5 11.8* 10.4 10.2 11.8*  NEUTROABS 8.0*  --  9.6* 6.9  --  7.4  HGB 10.0* 9.8* 8.6* 9.4* 8.8* 8.9*  HCT 31.0* 30.1* 26.0* 28.2* 26.5* 26.5*  MCV 94.8 94.4 92.9 92.8 91.7 92.0  PLT 231 205 192 276 273 594   Basic Metabolic Panel: Recent Labs  Lab 12/18/17 0659 12/19/17 0438 12/22/17 0359 12/23/17 0436 12/24/17 0409  NA 140 140 138 138 139  K 3.5 3.9 3.7 3.6 3.5  CL 106 109 103 103 104  CO2 24 24 28 28 29   GLUCOSE 153* 197* 127* 146* 147*  BUN 8 10 13 10 8   CREATININE 0.76 0.71 0.69 0.63 0.69  CALCIUM 8.6* 8.6* 8.6* 8.5* 8.4*  MG  --  1.8  --   --   --    Liver Function Tests: Recent Labs  Lab 12/18/17 0131 12/18/17 0659 12/19/17 0438  AST 15 19 16   ALT 13* 12* 15*  ALKPHOS 95 79 67  BILITOT 0.8 0.9 0.9  PROT 7.3 6.3* 6.2*  ALBUMIN 4.0 3.3* 3.0*   No results for input(s): LIPASE, AMYLASE in the last 168 hours. No results for input(s): AMMONIA in the last 168 hours. Cardiac Enzymes: Recent Labs  Lab 12/18/17 0658 12/18/17 1051 12/18/17 1630  TROPONINI <0.03 <0.03 <0.03   BNP (last 3 results) Recent Labs    12/18/17 0117  BNP 44.2   CBG: No results for input(s): GLUCAP in the last 168 hours. Time spent: 35 minutes  Signed:  Berle Mull  [facility] 12/24/2017 , 7:14 PM

## 2017-12-27 ENCOUNTER — Ambulatory Visit (HOSPITAL_COMMUNITY)
Admission: RE | Admit: 2017-12-27 | Discharge: 2017-12-27 | Disposition: A | Discharge status: Home / Self Care | Payer: Medicare Other | Source: Ambulatory Visit | Attending: Internal Medicine | Admitting: Internal Medicine

## 2017-12-27 DIAGNOSIS — Z08 Encounter for follow-up examination after completed treatment for malignant neoplasm: Secondary | ICD-10-CM | POA: Diagnosis not present

## 2017-12-27 DIAGNOSIS — C711 Malignant neoplasm of frontal lobe: Secondary | ICD-10-CM | POA: Diagnosis not present

## 2017-12-27 DIAGNOSIS — C719 Malignant neoplasm of brain, unspecified: Secondary | ICD-10-CM | POA: Diagnosis not present

## 2017-12-27 LAB — CULTURE, BLOOD (ROUTINE X 2)
CULTURE: NO GROWTH
Culture: NO GROWTH
SPECIAL REQUESTS: ADEQUATE
Special Requests: ADEQUATE

## 2017-12-27 MED ORDER — GADOBENATE DIMEGLUMINE 529 MG/ML IV SOLN
20.0000 mL | Freq: Once | INTRAVENOUS | Status: AC | PRN
  Administered 2017-12-27: 18 mL via INTRAVENOUS

## 2017-12-30 ENCOUNTER — Inpatient Hospital Stay: Payer: Non-veteran care | Attending: Internal Medicine

## 2017-12-30 DIAGNOSIS — C711 Malignant neoplasm of frontal lobe: Secondary | ICD-10-CM | POA: Insufficient documentation

## 2017-12-30 DIAGNOSIS — I2699 Other pulmonary embolism without acute cor pulmonale: Secondary | ICD-10-CM | POA: Insufficient documentation

## 2017-12-30 DIAGNOSIS — Z7901 Long term (current) use of anticoagulants: Secondary | ICD-10-CM | POA: Insufficient documentation

## 2017-12-30 DIAGNOSIS — R569 Unspecified convulsions: Secondary | ICD-10-CM | POA: Diagnosis not present

## 2017-12-30 DIAGNOSIS — C719 Malignant neoplasm of brain, unspecified: Secondary | ICD-10-CM

## 2017-12-30 LAB — CBC WITH DIFFERENTIAL (CANCER CENTER ONLY)
Basophils Absolute: 0.1 10*3/uL (ref 0.0–0.1)
Basophils Relative: 1 %
EOS ABS: 0.8 10*3/uL — AB (ref 0.0–0.5)
Eosinophils Relative: 8 %
HEMATOCRIT: 29.9 % — AB (ref 38.4–49.9)
HEMOGLOBIN: 9.9 g/dL — AB (ref 13.0–17.1)
Lymphocytes Relative: 23 %
Lymphs Abs: 2.2 10*3/uL (ref 0.9–3.3)
MCH: 30.3 pg (ref 27.2–33.4)
MCHC: 33 g/dL (ref 32.0–36.0)
MCV: 91.9 fL (ref 79.3–98.0)
MONOS PCT: 6 %
Monocytes Absolute: 0.6 10*3/uL (ref 0.1–0.9)
NEUTROS PCT: 62 %
Neutro Abs: 5.9 10*3/uL (ref 1.5–6.5)
Platelet Count: 414 10*3/uL — ABNORMAL HIGH (ref 140–400)
RBC: 3.26 MIL/uL — ABNORMAL LOW (ref 4.20–5.82)
RDW: 15.2 % — ABNORMAL HIGH (ref 11.0–14.6)
WBC Count: 9.6 10*3/uL (ref 4.0–10.3)

## 2017-12-30 LAB — CMP (CANCER CENTER ONLY)
ALT: 31 U/L (ref 0–55)
AST: 17 U/L (ref 5–34)
Albumin: 3.1 g/dL — ABNORMAL LOW (ref 3.5–5.0)
Alkaline Phosphatase: 79 U/L (ref 40–150)
Anion gap: 9 (ref 3–11)
BUN: 7 mg/dL (ref 7–26)
CHLORIDE: 106 mmol/L (ref 98–109)
CO2: 27 mmol/L (ref 22–29)
CREATININE: 0.84 mg/dL (ref 0.70–1.30)
Calcium: 10.1 mg/dL (ref 8.4–10.4)
GFR, Estimated: >60 mL/min (ref >60)
Glucose, Bld: 120 mg/dL (ref 70–140)
Potassium: 4.6 mmol/L (ref 3.5–5.1)
Sodium: 142 mmol/L (ref 136–145)
Total Bilirubin: 0.5 mg/dL (ref 0.2–1.2)
Total Protein: 7.9 g/dL (ref 6.4–8.3)

## 2017-12-31 ENCOUNTER — Telehealth: Payer: Self-pay | Admitting: Internal Medicine

## 2017-12-31 ENCOUNTER — Inpatient Hospital Stay: Payer: Non-veteran care

## 2017-12-31 ENCOUNTER — Encounter: Payer: Self-pay | Admitting: Internal Medicine

## 2017-12-31 ENCOUNTER — Inpatient Hospital Stay (HOSPITAL_BASED_OUTPATIENT_CLINIC_OR_DEPARTMENT_OTHER): Payer: Non-veteran care | Admitting: Internal Medicine

## 2017-12-31 VITALS — BP 137/61 | HR 100 | Temp 97.9°F | Resp 18 | Ht 76.0 in | Wt 192.8 lb

## 2017-12-31 DIAGNOSIS — R569 Unspecified convulsions: Secondary | ICD-10-CM

## 2017-12-31 DIAGNOSIS — Z7901 Long term (current) use of anticoagulants: Secondary | ICD-10-CM

## 2017-12-31 DIAGNOSIS — I2699 Other pulmonary embolism without acute cor pulmonale: Secondary | ICD-10-CM

## 2017-12-31 DIAGNOSIS — C711 Malignant neoplasm of frontal lobe: Secondary | ICD-10-CM

## 2017-12-31 NOTE — Progress Notes (Signed)
DISCONTINUE ON PATHWAY REGIMEN - Neuro     One cycle, daily for 42 days concurrent with RT:     Temozolomide   **Always confirm dose/schedule in your pharmacy ordering system**  REASON: Continuation Of Treatment PRIOR TREATMENT: [id number]: Radiation Therapy with Concurrent Temozolomide 75 mg/m2 Daily x 6 Weeks, Followed by Sequential Temozolomide TREATMENT RESPONSE: Stable Disease (SD)  START ON PATHWAY REGIMEN - Neuro     A cycle is every 28 days:     Temozolomide      Temozolomide   **Always confirm dose/schedule in your pharmacy ordering system**  Patient Characteristics: Glioblastoma, Newly Diagnosed / Treatment Naive, Good Performance Status and/or Younger Patient Disease Classification: Glioma Disease Classification: Glioblastoma Disease Status: Newly Diagnosed / Treatment Naive Performance Status: Good Performance Status and/or Younger Patient Intent of Therapy: Non-Curative / Palliative Intent, Discussed with Patient 

## 2017-12-31 NOTE — Telephone Encounter (Signed)
Appointments scheduled AVS/Calendar printed per 6/18 los °

## 2017-12-31 NOTE — Progress Notes (Signed)
[facility] at [address] [place], [place] [address] [phone]   Interval Evaluation  Date of Service: 12/31/17 Patient Name: Christopher Morris Patient MRN: [id number] Patient DOB: 1941/06/17 Provider: Ventura Sellers, MD  Identifying Statement:  Christopher Morris is a 77 y.o. male with left frontal glioblastoma    Oncologic History:   Frontal glioblastoma multiforme ([place])   09/30/2017 Surgery    Craniotomy, resection by Dr. Sherwood Gambler.  Path demonstrates glioblastoma      10/28/2017 -  Radiation Therapy    IMRT with concurrent Temodar       Biomarkers:  MGMT Methylated.  IDH 1/2 Unknown.  EGFR Amplified  EGFRvIII positive   Interval History:  Christopher Morris presents today for follow up after completing radiation therapy.  Last week he was hospitalized for abdominal pain, shortness of breath, fever, and found to have sub-occlusive pulmonary embolism and lobar consolidation c/w pneumonia.  He was started on Eliquis and received full course of antiobiotics.  Today he is comfortable and back to his baseline.  He is walking longer distances with his new walker, but also ambulates independently at home.  Otherwise denies seizures, headaches, new or progressive neurologic deficits.    Medications: Current Outpatient Medications on File Prior to Visit  Medication Sig Dispense Refill  . apixaban (ELIQUIS) 5 MG TABS tablet Take 10 mg BID till 12/24/2017, start taking 5 mg BID from 12/25/2017 60 tablet 0  . levETIRAcetam (KEPPRA) 1000 MG tablet Take 1 tablet (1,000 mg total) by mouth 2 (two) times daily. 30 tablet 1  . acetaminophen-codeine (TYLENOL #3) 300-30 MG tablet Take 1 tablet by mouth every 8 (eight) hours as needed for moderate pain or severe pain (cough). (Patient not taking: Reported on 12/31/2017) 15 tablet 0  . cyanocobalamin (,VITAMIN B-12,) 1000 MCG/ML injection Inject 1 mL (1,000 mcg total) into the muscle daily. Take Vitamin B12  1019mg IM daily x 5 days, then vitamin B12 10093m IM weekly, then vitamin B12 100039mmonthly. (Patient not taking: Reported on 12/31/2017) 10 mL 0  . guaiFENesin (MUCINEX) 600 MG 12 hr tablet Take 1 tablet (600 mg total) by mouth 2 (two) times daily. (Patient not taking: Reported on 12/31/2017) 30 tablet 0  . hydrocortisone cream 1 % Apply 1 application topically 2 (two) times daily as needed for itching.    . SMarland KitchenRINGE-NEEDLE, DISP, 5 ML (SAFETY SYRINGES/NEEDLE) 21G X 1-1/2" 5 ML MISC 1,000 mcg by Does not apply route daily. (Patient not taking: Reported on 12/05/2017) 50 each 0   No current facility-administered medications on file prior to visit.     Allergies:  Allergies  Allergen Reactions  . Pork-Derived Products     Patient does not eat pork  . Shellfish Allergy     Patient does not eat shellfish   Past Medical History:  Past Medical History:  Diagnosis Date  . [place] . [place]li[place] ([facility]13/[place]  . Hyperglycemia 09/25/2017  . Seizure ([facility]13/2019   Past Surgical History:  Past Surgical History:  Procedure Laterality Date  . APPLICATION OF CRANIAL NAVIGATION N/A 09/30/2017   Procedure: APPLICATION OF CRANIAL NAVIGATION;  Surgeon: NudJovita GammaD;  [facility];  Laterality: N/A;  . CRANIOTOMY N/A 09/30/2017   Procedure: CRANIOTOMY FOR RESECTION OF BRAIN TUMOR WITH STEREOTACTIC;  Surgeon: NudJovita GammaD;  [facility];  Laterality: N/A;  CRANIOTOMY FOR RESECTION OF BRAIN TUMOR WITH STEREOTACTIC  Social History:  Social History   Socioeconomic History  . Marital status: Divorced    Spouse name: Not on file  . Number of children: Not on file  . Years of education: Not on file  . Highest education level: Not on file  Occupational History  . Not on file  Social Needs  . Financial resource strain: Not on file  . Food insecurity:    Worry: Not on file    Inability: Not on file  .  Transportation needs:    Medical: No    Non-medical: No  Tobacco Use  . Smoking status: Never Smoker  . Smokeless tobacco: Never Used  Substance and Sexual Activity  . Alcohol use: No    Frequency: Never  . Drug use: No  . Sexual activity: Not Currently  Lifestyle  . Physical activity:    Days per week: 5 days    Minutes per session: Not on file  . Stress: Only a little  Relationships  . Social connections:    Talks on phone: Not on file    Gets together: Not on file    Attends religious service: Not on file    Active member of club or organization: Not on file    Attends meetings of clubs or organizations: Not on file    Relationship status: Not on file  . Intimate partner violence:    Fear of current or ex partner: Not on file    Emotionally abused: Not on file    Physically abused: Not on file    Forced sexual activity: Not on file  Other Topics Concern  . Not on file  Social History Narrative  . Not on file   Family History:  Family History  Problem Relation Age of Onset  . Hypertension Daughter   . Stroke Mother   . Cancer Neg Hx     Review of Systems: Constitutional: Denies fevers, chills or abnormal weight loss Eyes: Denies blurriness of vision Ears, nose, mouth, throat, and face: Denies mucositis or sore throat Respiratory: Denies cough, dyspnea or wheezes Cardiovascular: Denies palpitation, chest discomfort or lower extremity swelling Gastrointestinal:  Denies nausea, constipation, diarrhea GU: Denies dysuria or incontinence Skin: Denies abnormal skin rashes Neurological: Per HPI Musculoskeletal: Denies joint pain, back or neck discomfort. No decrease in ROM Behavioral/Psych: Denies anxiety, disturbance in thought content, and mood instability  Physical Exam: Vitals:   12/31/17 1153  BP: 137/61  Pulse: 100  Resp: 18  Temp: 97.9 F (36.6 C)  SpO2: 100%   KPS: 80. General: Alert, cooperative, pleasant, in no acute distress Head: Craniotomy  scar noted, dry and intact. EENT: No conjunctival injection or scleral icterus. Oral mucosa moist Lungs: Resp effort normal Cardiac: Regular rate and rhythm Abdomen: Soft, non-distended abdomen Skin: No rashes cyanosis or petechiae. Extremities: No clubbing or edema  Neurologic Exam: Mental Status: Awake, alert, attentive to examiner. Oriented to self and environment. Language has mild impairment in fluency with intact comprehension and repetition.  Cranial Nerves: Visual acuity is grossly normal. Visual fields are full. Extra-ocular movements intact. No ptosis. Face is symmetric, tongue midline. Motor: Tone and bulk are normal. Right arm and leg 5/5, left side 5/5 power. Reflexes are symmetric, no pathologic reflexes present. Intact finger to nose bilaterally Sensory: Intact to light touch and temperature Gait: Independent but purposeful and wide based   Labs: I have reviewed the data as listed    Component Value Date/Time   NA 142 12/30/2017 1050  NA 136 (A) 10/18/2017   K 4.6 12/30/2017 1050   CL 106 12/30/2017 1050   CO2 27 12/30/2017 1050   GLUCOSE 120 12/30/2017 1050   BUN 7 12/30/2017 1050   BUN 19 10/18/2017   CREATININE 0.84 12/30/2017 1050   CALCIUM 10.1 12/30/2017 1050   PROT 7.9 12/30/2017 1050   ALBUMIN 3.1 (L) 12/30/2017 1050   AST 17 12/30/2017 1050   ALT 31 12/30/2017 1050   ALKPHOS 79 12/30/2017 1050   BILITOT 0.5 12/30/2017 1050   GFRNONAA >60 12/30/2017 1050   GFRAA >60 12/30/2017 1050   Lab Results  Component Value Date   WBC 9.6 12/30/2017   NEUTROABS 5.9 12/30/2017   HGB 9.9 (L) 12/30/2017   HCT 29.9 (L) 12/30/2017   MCV 91.9 12/30/2017   PLT 414 (H) 12/30/2017   Imaging:  [place] Clinician Interpretation: I have personally reviewed the CNS images as listed.  My interpretation, in the context of the patient's clinical presentation, is stable disease  Dg Chest 2 View  Result Date: 12/22/2017 CLINICAL DATA:  Initial evaluation for acute shortness  of breath. EXAM: CHEST - 2 VIEW COMPARISON:  Prior radiograph from 12/18/2017. FINDINGS: Mild cardiomegaly. Mediastinal silhouette within normal limits. Aortic atherosclerosis. Lungs are hypoinflated. Small layering bilateral pleural effusions. Associated bibasilar opacities may reflect atelectasis and/or infiltrates, right greater than left. Perihilar vascular congestion without frank pulmonary edema. No pneumothorax. No acute osseus abnormality. IMPRESSION: 1. Low lung volumes with small bilateral pleural effusions. Associated bibasilar opacities may reflect atelectasis and/or infiltrates, right greater than left. 2. Cardiomegaly with perihilar vascular congestion without frank pulmonary edema. Electronically Signed   By: Jeannine Boga M.D.   On: 12/22/2017 04:01   Dg Chest 2 View  Result Date: 12/18/2017 CLINICAL DATA:  Acute onset of shortness of breath. EXAM: CHEST - 2 VIEW COMPARISON:  Chest radiograph performed 11/13/2017 FINDINGS: The lungs are hypoexpanded. Mild bibasilar atelectasis is noted. No definite pleural effusion or pneumothorax is seen. The heart is mildly enlarged. No acute osseous abnormalities are seen. IMPRESSION: Lungs hypoexpanded, with mild bibasilar atelectasis. Mild cardiomegaly. Electronically Signed   By: Garald Balding M.D.   On: 12/18/2017 02:28   Ct Angio Chest Pe W And/or Wo Contrast  Result Date: 12/22/2017 CLINICAL DATA:  Assess interval change in patient's known right-sided pulmonary embolus. Shortness of breath. EXAM: CT ANGIOGRAPHY CHEST WITH CONTRAST TECHNIQUE: Multidetector CT imaging of the chest was performed using the standard protocol during bolus administration of intravenous contrast. Multiplanar CT image reconstructions and MIPs were obtained to evaluate the vascular anatomy. CONTRAST:  150m ISOVUE-370 IOPAMIDOL (ISOVUE-370) INJECTION 76% COMPARISON:  CTA of the chest performed 12/18/2017, and chest radiograph performed earlier today at 3:46 a.m.  FINDINGS: Cardiovascular: The patient's right-sided pulmonary embolus is unchanged from the prior study. However, it is better seen due to less motion artifact on the current study. There is embolus in the right main pulmonary artery, extending distally into subsegmental branches. The RV/LV ratio is now 1.9, corresponding to significant right heart strain. The heart is normal in size. Scattered calcification is noted along the aortic arch. The great vessels are unremarkable in appearance. Mediastinum/Nodes: Enlarged mediastinal nodes are seen, measuring up to 1.6 cm in short axis at the azygoesophageal recess. No pericardial effusion is identified. The thyroid gland is diminutive and grossly unremarkable. No axillary lymphadenopathy is seen. Lungs/Pleura: Worsening right lower lobe airspace opacification is concerning for pneumonia superimposed on pulmonary infarct. A small right pleural effusion is noted. Minimal left  basilar atelectasis is noted. No pneumothorax is seen. No dominant mass is identified. Upper Abdomen: The visualized portions of the liver and the spleen are unremarkable in appearance. The gallbladder is grossly unremarkable. The visualized portions of the pancreas, adrenal glands and kidneys are unremarkable. Mild bilateral perinephric stranding is seen. Musculoskeletal: No acute osseous abnormalities are identified. Mild degenerative change is noted at the lower cervical spine. The visualized musculature is unremarkable in appearance. Review of the MIP images confirms the above findings. IMPRESSION: 1. Right-sided pulmonary embolus is unchanged from the recent prior study. However, it is better seen due to decreased motion artifact on the prior study. Embolus noted at the right main pulmonary artery, extending distally into subsegmental branches. Positive for acute PE with CT evidence of right heart strain (RV/LV Ratio = 1.9) consistent with at least submassive (intermediate risk) PE. The presence  of right heart strain has been associated with an increased risk of morbidity and mortality. Please activate Code PE by paging [phone]. 2. Worsening right lower lobe airspace opacification is concerning for pneumonia superimposed on pulmonary infarct. Small right pleural effusion noted. 3. Enlarged mediastinal nodes, measuring up to 1.6 cm in short axis at the azygoesophageal recess. This could reflect underlying infection. Critical Value/emergent results were called by telephone at the time of interpretation on 12/22/2017 at 6:10 am to [facility] PA, who verbally acknowledged these results. Electronically Signed   By: Garald Balding M.D.   On: 12/22/2017 06:17   Ct Angio Chest Pe W Or Wo Contrast  Result Date: 12/18/2017 CLINICAL DATA:  Shortness of breath and fever EXAM: CT ANGIOGRAPHY CHEST WITH CONTRAST TECHNIQUE: Multidetector CT imaging of the chest was performed using the standard protocol during bolus administration of intravenous contrast. Multiplanar CT image reconstructions and MIPs were obtained to evaluate the vascular anatomy. CONTRAST:  19m ISOVUE-370 IOPAMIDOL (ISOVUE-370) INJECTION 76% COMPARISON:  Chest CT September 25, 2017; chest radiograph December 18, 2017 FINDINGS: Cardiovascular: There are multiple pulmonary emboli throughout the right lower lobe pulmonary arterial system. No pulmonary emboli are noted to the left of midline or in either main pulmonary artery. The right ventricle to left ventricle diameter ratio is less than 0.9, not consistent with right heart strain. There is no appreciable thoracic aortic aneurysm or dissection. Visualized great vessels appear unremarkable. There is aortic atherosclerosis. There are occasional foci coronary artery calcification. There is no pericardial effusion or pericardial thickening evident. Mediastinum/Nodes: Thyroid appears normal. There is no appreciable adenopathy. There are scattered subcentimeter lymph nodes which do not meet size criteria for  pathologic significance. No esophageal lesions are evident. Lungs/Pleura: There is patchy airspace consolidation in portions of the lateral and posterior segments of the right lower lobe. There is a small right pleural effusion. No pulmonary infarct is demonstrable. There is bibasilar atelectasis as well. Upper Abdomen: Visualized upper abdominal structures appear normal. Musculoskeletal: There is degenerative change in thoracic spine. There are no blastic or lytic bone lesions. There is a degree of gynecomastia bilaterally. Review of the MIP images confirms the above findings. IMPRESSION: 1. Multiple right lower lobe pulmonary artery branch emboli. No more central pulmonary embolus. No pulmonary embolus to the left of midline. No right heart strain evident. 2. Small left pleural effusion with areas of airspace consolidation in portions of the lateral and posterior segments of the right lower lobe, likely pneumonia. No wedge-shaped defect to suggest pulmonary infarct appreciable. There is bibasilar atelectasis. 3. Aortic atherosclerosis. Foci coronary artery calcification noted. No thoracic aortic aneurysm or  dissection. 4.  No demonstrable thoracic adenopathy. 5.  Mild gynecomastia bilaterally. Aortic Atherosclerosis (ICD10-I70.0). Critical Value/emergent results were called by telephone at the time of interpretation on 12/18/2017 at 10:46 am to Dr. Berle Mull , who verbally acknowledged these results. Electronically Signed   By: Lowella Grip III M.D.   On: 12/18/2017 10:46   [name] ZJ Contrast  Result Date: 12/27/2017 CLINICAL DATA:  Continued surveillance glioblastoma. Tumor resection 09/30/2016. EXAM: MRI HEAD WITHOUT AND WITH CONTRAST TECHNIQUE: Multiplanar, multiecho pulse sequences of the brain and surrounding structures were obtained without and with intravenous contrast. CONTRAST:  26m MULTIHANCE GADOBENATE DIMEGLUMINE 529 MG/ML IV SOLN COMPARISON:  10/02/2017. FINDINGS: Brain: No acute  stroke, acute hemorrhage, or extra-axial fluid. Mild to moderate ventriculomegaly with focal and confluent subcortical and periventricular white matter signal abnormality, stable. Continued involution of vasogenic edema and mass effect related to subtotal resection of a LEFT frontal glioblastoma. Blood products along the resection site are redemonstrated, but not increased. The resection cavity is considerably smaller, now 11 x 14 mm on image 43 series 12. Enhancement extends posteriorly along the cingulate gyrus and surrounds the cavity, suggesting residual tumor; see for instance series 13, image 17, and series 14, image 16. The craniocaudal extent of enhancement on image 17 coronal is 24 mm, decreased as compared with 32 mm prior. Vascular: Flow voids are maintained. Skull and upper cervical spine: Normal marrow signal. Cervical spondylosis. Sinuses/Orbits: Negative. Other: None. IMPRESSION: Improved appearance status post subtotal LEFT frontal glioblastoma resection. Decreased overall enhancement, smaller resection cavity, and less vasogenic edema. Continued surveillance warranted. Electronically Signed   By: JStaci RighterM.D.   On: 12/27/2017 09:56   Dg Abd 2 Views  Result Date: 12/18/2017 CLINICAL DATA:  Abdominal pain EXAM: ABDOMEN - 2 VIEW COMPARISON:  None. FINDINGS: The bowel gas pattern is normal. There is no evidence of free air. No radio-opaque calculi or other significant radiographic abnormality is seen. IMPRESSION: Negative. Electronically Signed   By: KUlyses JarredM.D.   On: 12/18/2017 05:43    Assessment/Plan 1. [place]lioblastoma ([facility]  2. Seizure ([facility]  Mr. SSchmaleis clinically and radiographically stable following radiation.  Today we extensively discussed treatment options moving forward, including chemotherapy and tumor-treating fields.  We counseled on potential side effects of Temodar including nausea, constipation and cytopenias.    We recommended initiating treatment with  Temozolomide 150 mg/m2, on for five days and off for twenty three days in twenty eight day cycles. The patient will have a complete blood count performed on days 21 and 28 of each cycle, and a comprehensive metabolic panel performed on day 28 of each cycle. Labs may need to be performed more often. Zofran will prescribed for home use for nausea/vomiting.   Chemotherapy should be held for the following:  ANC less than 1,000  Platelets less than 100,000  LFT or creatinine greater than 2x ULN  If clinical concerns/contraindications develop  Based on RT completion date he should start cycle #1 next Monday evening.  He should continue Keppra and Eliquis at established doses.  We recommend he return in 5 weeks with set of labs for review.  All questions were answered. The patient knows to call the clinic with any problems, questions or concerns. No barriers to learning were detected.  The total time spent in the encounter was 40 minutes and more than 50% was on counseling and review of test results   ZVentura Sellers MD Medical Director of [facility]  [place] at [facility] 12/31/17 3:24 PM

## 2018-01-01 ENCOUNTER — Telehealth: Payer: Self-pay | Admitting: Pharmacist

## 2018-01-01 DIAGNOSIS — C719 Malignant neoplasm of brain, unspecified: Secondary | ICD-10-CM

## 2018-01-01 DIAGNOSIS — C711 Malignant neoplasm of frontal lobe: Secondary | ICD-10-CM

## 2018-01-01 NOTE — Telephone Encounter (Signed)
Oral Oncology Pharmacist Encounter  Received notification from MD that patient is to start adjuvant phase of Temodar (temozolomide) for the treatment of glioblastoma multiform, planned duration 6-12 cycles.  Patient had been previously prescribed Temodar to take during radiation concurrent phase. Patient was not able to start the Temodar during radiation due to multiple admissions and question of persistent infection.  Daughter stated to MD that they have a large supply of Temodar capsules at the house already. Patient received 140mg  capsules #30 and 20mg  capsules #30 in April 2019. Current home supply will be used at this time for adjuvant dosing  Temodar 150 mg/m2 x 2.17 m2 = ~325mg  daily, dose rounded to 320mg  (140mg  x 2 + 20mg  x 2)  Patient has enough Temodar on hand for ~ 3 cycles if dose remains at 320mg   Labs from 12/30/17 assessed, OK for treatment  Current medication list in [facility] reviewed, no DDIs with Temodar identified. Current medication list has been updated to reflect adjuvant Temodar dosing  I called and left message for patient's daughter, Christopher Morris, to touch base about starting adjuvant treatment and about continued medication acquisition at completion of home supply.  I left instructions on voicemail for patient to take antinausea medication 30 to 60 minutes prior to Temodar dosing for his 5 days on of each 28-day cycle.  I left contact information for oral oncology clinic if Christopher Morris should have any further questions or need for the office to get new prescriptions to dispensing pharmacy.  We had previously discovered that Temodar prescriptions must be filled through [facility] per insurance requirement. New test claim will be run at local pharmacy once patient needs continued Temodar supply from the pharmacy.  Oral Oncology Clinic will continue to follow.  [name], PharmD, BCPS, BCOP  01/01/2018 9:39 AM Oral Oncology Clinic [phone]

## 2018-01-02 ENCOUNTER — Encounter: Payer: Self-pay | Admitting: Internal Medicine

## 2018-01-02 ENCOUNTER — Telehealth: Payer: Self-pay | Admitting: Internal Medicine

## 2018-01-03 ENCOUNTER — Telehealth: Payer: Self-pay

## 2018-01-03 NOTE — Telephone Encounter (Signed)
Received call from patient daughter requesting a letter from the MD to be faxed to the [place] to remodel the bathroom for safety. Letter was faxed atten: Sherron Flemings, fax [phone] and daughter made aware.

## 2018-01-09 DIAGNOSIS — A4151 Sepsis due to Escherichia coli [E. coli]: Secondary | ICD-10-CM | POA: Diagnosis not present

## 2018-01-09 DIAGNOSIS — J189 Pneumonia, unspecified organism: Secondary | ICD-10-CM | POA: Diagnosis not present

## 2018-01-09 DIAGNOSIS — D630 Anemia in neoplastic disease: Secondary | ICD-10-CM | POA: Diagnosis not present

## 2018-01-09 DIAGNOSIS — R569 Unspecified convulsions: Secondary | ICD-10-CM | POA: Diagnosis not present

## 2018-01-09 DIAGNOSIS — N390 Urinary tract infection, site not specified: Secondary | ICD-10-CM | POA: Diagnosis not present

## 2018-01-09 DIAGNOSIS — C711 Malignant neoplasm of frontal lobe: Secondary | ICD-10-CM | POA: Diagnosis not present

## 2018-01-14 ENCOUNTER — Ambulatory Visit
Admission: RE | Admit: 2018-01-14 | Discharge: 2018-01-14 | Disposition: A | Discharge status: Home / Self Care | Payer: Medicare Other | Source: Ambulatory Visit | Attending: Radiation Oncology | Admitting: Radiation Oncology

## 2018-01-14 ENCOUNTER — Encounter: Payer: Self-pay | Admitting: Radiation Oncology

## 2018-01-14 VITALS — BP 127/63 | HR 93 | Temp 98.2°F | Resp 18 | Ht 76.0 in | Wt 195.4 lb

## 2018-01-14 DIAGNOSIS — D630 Anemia in neoplastic disease: Secondary | ICD-10-CM | POA: Diagnosis not present

## 2018-01-14 DIAGNOSIS — J189 Pneumonia, unspecified organism: Secondary | ICD-10-CM | POA: Diagnosis not present

## 2018-01-14 DIAGNOSIS — Z79899 Other long term (current) drug therapy: Secondary | ICD-10-CM | POA: Insufficient documentation

## 2018-01-14 DIAGNOSIS — Z923 Personal history of irradiation: Secondary | ICD-10-CM | POA: Insufficient documentation

## 2018-01-14 DIAGNOSIS — C719 Malignant neoplasm of brain, unspecified: Secondary | ICD-10-CM

## 2018-01-14 DIAGNOSIS — Z08 Encounter for follow-up examination after completed treatment for malignant neoplasm: Secondary | ICD-10-CM | POA: Insufficient documentation

## 2018-01-14 DIAGNOSIS — Z7901 Long term (current) use of anticoagulants: Secondary | ICD-10-CM | POA: Diagnosis not present

## 2018-01-14 DIAGNOSIS — Z91013 Allergy to seafood: Secondary | ICD-10-CM | POA: Diagnosis not present

## 2018-01-14 DIAGNOSIS — C711 Malignant neoplasm of frontal lobe: Secondary | ICD-10-CM | POA: Insufficient documentation

## 2018-01-14 DIAGNOSIS — A4151 Sepsis due to Escherichia coli [E. coli]: Secondary | ICD-10-CM | POA: Diagnosis not present

## 2018-01-14 DIAGNOSIS — R569 Unspecified convulsions: Secondary | ICD-10-CM | POA: Diagnosis not present

## 2018-01-14 DIAGNOSIS — N390 Urinary tract infection, site not specified: Secondary | ICD-10-CM | POA: Diagnosis not present

## 2018-01-14 NOTE — Addendum Note (Signed)
Encounter addended by: Malena Edman, RN on: 01/14/2018 1:47 PM  Actions taken: Charge Capture section accepted

## 2018-01-14 NOTE — Progress Notes (Signed)
Radiation Oncology         [id number]) [phone] ________________________________  Name: Christopher Morris MRN: [id number]  Date of Service: 01/14/2018 DOB: 1941/04/30  Post Treatment Note  CC: Patient, No Pcp Per  [name], MD  Diagnosis:  Glioblastoma of the left frontal lobe    Interval Since Last Radiation:  5 weeks   10/28/2017 - 12/10/2017: The left frontal lobe was initially treated to 46 Gy in 23 fractions, followed by a 14 Gy boost in 7 fractions to yield a total dose of 60 Gy.  Narrative:  The patient returns today for routine follow-up. The patient tolerated his radiotherapy well and his gait and balance improvement with the help of PT.                           On review of systems, the patient states he's doing well, and reports his skin is still dry but he's noticing improvement. He denies any concerns with confusion, issues of urinary or bowel difficulties. No other complaints are verbalized.  ALLERGIES:  is allergic to pork-derived products and shellfish allergy.  Meds: Current Outpatient Medications  Medication Sig Dispense Refill  . apixaban (ELIQUIS) 5 MG TABS tablet Take 10 mg BID till 12/24/2017, start taking 5 mg BID from 12/25/2017 60 tablet 0  . hydrocortisone cream 1 % Apply 1 application topically 2 (two) times daily as needed for itching.    . levETIRAcetam (KEPPRA) 1000 MG tablet Take 1 tablet (1,000 mg total) by mouth 2 (two) times daily. 30 tablet 1  . SYRINGE-NEEDLE, DISP, 5 ML (SAFETY SYRINGES/NEEDLE) 21G X 1-1/2" 5 ML MISC 1,000 mcg by Does not apply route daily. 50 each 0  . temozolomide (TEMODAR) 140 MG capsule Take 280 mg by mouth daily. Take with 20mg  capsules x 2 for 320mg  total daily dose. Take for 5 days on 23 days off, repeat every 28 days. May take on an empty stomach or at bedtime to decrease N/V.    Marland Kitchen temozolomide (TEMODAR) 20 MG capsule Take 40 mg by mouth daily. Take with 140mg  capsules x 2 for 320mg  total daily dose. Take for 5 days on 23 days  off, repeat every 28 days. May take on an empty stomach or at bedtime to decrease N/V.    Marland Kitchen acetaminophen-codeine (TYLENOL #3) 300-30 MG tablet Take 1 tablet by mouth every 8 (eight) hours as needed for moderate pain or severe pain (cough). (Patient not taking: Reported on 12/31/2017) 15 tablet 0  . cyanocobalamin (,VITAMIN B-12,) 1000 MCG/ML injection Inject 1 mL (1,000 mcg total) into the muscle daily. Take Vitamin B12 1051mcg IM daily x 5 days, then vitamin B12 1050mcg IM weekly, then vitamin B12 1072mcg monthly. (Patient not taking: Reported on 01/14/2018) 10 mL 0  . guaiFENesin (MUCINEX) 600 MG 12 hr tablet Take 1 tablet (600 mg total) by mouth 2 (two) times daily. (Patient not taking: Reported on 12/31/2017) 30 tablet 0   No current facility-administered medications for this encounter.     Physical Findings:  height is 6\' 4"  (1.93 m) and weight is 195 lb 6.4 oz (88.6 kg). His oral temperature is 98.2 F (36.8 C). His blood pressure is 127/63 and his pulse is 93. His respiration is 18 and oxygen saturation is 100%.  Pain Assessment Pain Score: 0-No pain/10 In general this is a well appearing African American male in no acute distress. He's alert and oriented x4 and appropriate throughout the examination. Cardiopulmonary  assessment is negative for acute distress and he exhibits normal effort. His frontal surgical site is intact with mild hyperpigmentation.  Lab Findings: Lab Results  Component Value Date   WBC 9.6 12/30/2017   HGB 9.9 (L) 12/30/2017   HCT 29.9 (L) 12/30/2017   MCV 91.9 12/30/2017   PLT 414 (H) 12/30/2017     Radiographic Findings: Dg Chest 2 View  Result Date: 12/22/2017 CLINICAL DATA:  Initial evaluation for acute shortness of breath. EXAM: CHEST - 2 VIEW COMPARISON:  Prior radiograph from 12/18/2017. FINDINGS: Mild cardiomegaly. Mediastinal silhouette within normal limits. Aortic atherosclerosis. Lungs are hypoinflated. Small layering bilateral pleural effusions.  Associated bibasilar opacities may reflect atelectasis and/or infiltrates, right greater than left. Perihilar vascular congestion without frank pulmonary edema. No pneumothorax. No acute osseus abnormality. IMPRESSION: 1. Low lung volumes with small bilateral pleural effusions. Associated bibasilar opacities may reflect atelectasis and/or infiltrates, right greater than left. 2. Cardiomegaly with perihilar vascular congestion without frank pulmonary edema. Electronically Signed   By: Jeannine Boga M.D.   On: 12/22/2017 04:01   Dg Chest 2 View  Result Date: 12/18/2017 CLINICAL DATA:  Acute onset of shortness of breath. EXAM: CHEST - 2 VIEW COMPARISON:  Chest radiograph performed 11/13/2017 FINDINGS: The lungs are hypoexpanded. Mild bibasilar atelectasis is noted. No definite pleural effusion or pneumothorax is seen. The heart is mildly enlarged. No acute osseous abnormalities are seen. IMPRESSION: Lungs hypoexpanded, with mild bibasilar atelectasis. Mild cardiomegaly. Electronically Signed   By: Garald Balding M.D.   On: 12/18/2017 02:28   Ct Angio Chest Pe W And/or Wo Contrast  Result Date: 12/22/2017 CLINICAL DATA:  Assess interval change in patient's known right-sided pulmonary embolus. Shortness of breath. EXAM: CT ANGIOGRAPHY CHEST WITH CONTRAST TECHNIQUE: Multidetector CT imaging of the chest was performed using the standard protocol during bolus administration of intravenous contrast. Multiplanar CT image reconstructions and MIPs were obtained to evaluate the vascular anatomy. CONTRAST:  159mL ISOVUE-370 IOPAMIDOL (ISOVUE-370) INJECTION 76% COMPARISON:  CTA of the chest performed 12/18/2017, and chest radiograph performed earlier today at 3:46 a.m. FINDINGS: Cardiovascular: The patient's right-sided pulmonary embolus is unchanged from the prior study. However, it is better seen due to less motion artifact on the current study. There is embolus in the right main pulmonary artery, extending distally  into subsegmental branches. The RV/LV ratio is now 1.9, corresponding to significant right heart strain. The heart is normal in size. Scattered calcification is noted along the aortic arch. The great vessels are unremarkable in appearance. Mediastinum/Nodes: Enlarged mediastinal nodes are seen, measuring up to 1.6 cm in short axis at the azygoesophageal recess. No pericardial effusion is identified. The thyroid gland is diminutive and grossly unremarkable. No axillary lymphadenopathy is seen. Lungs/Pleura: Worsening right lower lobe airspace opacification is concerning for pneumonia superimposed on pulmonary infarct. A small right pleural effusion is noted. Minimal left basilar atelectasis is noted. No pneumothorax is seen. No dominant mass is identified. Upper Abdomen: The visualized portions of the liver and the spleen are unremarkable in appearance. The gallbladder is grossly unremarkable. The visualized portions of the pancreas, adrenal glands and kidneys are unremarkable. Mild bilateral perinephric stranding is seen. Musculoskeletal: No acute osseous abnormalities are identified. Mild degenerative change is noted at the lower cervical spine. The visualized musculature is unremarkable in appearance. Review of the MIP images confirms the above findings. IMPRESSION: 1. Right-sided pulmonary embolus is unchanged from the recent prior study. However, it is better seen due to decreased motion artifact on the prior  study. Embolus noted at the right main pulmonary artery, extending distally into subsegmental branches. Positive for acute PE with CT evidence of right heart strain (RV/LV Ratio = 1.9) consistent with at least submassive (intermediate risk) PE. The presence of right heart strain has been associated with an increased risk of morbidity and mortality. Please activate Code PE by paging [phone]. 2. Worsening right lower lobe airspace opacification is concerning for pneumonia superimposed on pulmonary  infarct. Small right pleural effusion noted. 3. Enlarged mediastinal nodes, measuring up to 1.6 cm in short axis at the azygoesophageal recess. This could reflect underlying infection. Critical Value/emergent results were called by telephone at the time of interpretation on 12/22/2017 at 6:10 am to [facility] PA, who verbally acknowledged these results. Electronically Signed   By: Garald Balding M.D.   On: 12/22/2017 06:17   Ct Angio Chest Pe W Or Wo Contrast  Result Date: 12/18/2017 CLINICAL DATA:  Shortness of breath and fever EXAM: CT ANGIOGRAPHY CHEST WITH CONTRAST TECHNIQUE: Multidetector CT imaging of the chest was performed using the standard protocol during bolus administration of intravenous contrast. Multiplanar CT image reconstructions and MIPs were obtained to evaluate the vascular anatomy. CONTRAST:  51mL ISOVUE-370 IOPAMIDOL (ISOVUE-370) INJECTION 76% COMPARISON:  Chest CT September 25, 2017; chest radiograph December 18, 2017 FINDINGS: Cardiovascular: There are multiple pulmonary emboli throughout the right lower lobe pulmonary arterial system. No pulmonary emboli are noted to the left of midline or in either main pulmonary artery. The right ventricle to left ventricle diameter ratio is less than 0.9, not consistent with right heart strain. There is no appreciable thoracic aortic aneurysm or dissection. Visualized great vessels appear unremarkable. There is aortic atherosclerosis. There are occasional foci coronary artery calcification. There is no pericardial effusion or pericardial thickening evident. Mediastinum/Nodes: Thyroid appears normal. There is no appreciable adenopathy. There are scattered subcentimeter lymph nodes which do not meet size criteria for pathologic significance. No esophageal lesions are evident. Lungs/Pleura: There is patchy airspace consolidation in portions of the lateral and posterior segments of the right lower lobe. There is a small right pleural effusion. No pulmonary  infarct is demonstrable. There is bibasilar atelectasis as well. Upper Abdomen: Visualized upper abdominal structures appear normal. Musculoskeletal: There is degenerative change in thoracic spine. There are no blastic or lytic bone lesions. There is a degree of gynecomastia bilaterally. Review of the MIP images confirms the above findings. IMPRESSION: 1. Multiple right lower lobe pulmonary artery branch emboli. No more central pulmonary embolus. No pulmonary embolus to the left of midline. No right heart strain evident. 2. Small left pleural effusion with areas of airspace consolidation in portions of the lateral and posterior segments of the right lower lobe, likely pneumonia. No wedge-shaped defect to suggest pulmonary infarct appreciable. There is bibasilar atelectasis. 3. Aortic atherosclerosis. Foci coronary artery calcification noted. No thoracic aortic aneurysm or dissection. 4.  No demonstrable thoracic adenopathy. 5.  Mild gynecomastia bilaterally. Aortic Atherosclerosis (ICD10-I70.0). Critical Value/emergent results were called by telephone at the time of interpretation on 12/18/2017 at 10:46 am to Dr. Berle Mull , who verbally acknowledged these results. Electronically Signed   By: Lowella Grip III M.D.   On: 12/18/2017 10:46   [name] NO Contrast  Result Date: 12/27/2017 CLINICAL DATA:  Continued surveillance glioblastoma. Tumor resection 09/30/2016. EXAM: MRI HEAD WITHOUT AND WITH CONTRAST TECHNIQUE: Multiplanar, multiecho pulse sequences of the brain and surrounding structures were obtained without and with intravenous contrast. CONTRAST:  14mL MULTIHANCE GADOBENATE DIMEGLUMINE 529  MG/ML IV SOLN COMPARISON:  10/02/2017. FINDINGS: Brain: No acute stroke, acute hemorrhage, or extra-axial fluid. Mild to moderate ventriculomegaly with focal and confluent subcortical and periventricular white matter signal abnormality, stable. Continued involution of vasogenic edema and mass effect related to  subtotal resection of a LEFT frontal glioblastoma. Blood products along the resection site are redemonstrated, but not increased. The resection cavity is considerably smaller, now 11 x 14 mm on image 43 series 12. Enhancement extends posteriorly along the cingulate gyrus and surrounds the cavity, suggesting residual tumor; see for instance series 13, image 17, and series 14, image 16. The craniocaudal extent of enhancement on image 17 coronal is 24 mm, decreased as compared with 32 mm prior. Vascular: Flow voids are maintained. Skull and upper cervical spine: Normal marrow signal. Cervical spondylosis. Sinuses/Orbits: Negative. Other: None. IMPRESSION: Improved appearance status post subtotal LEFT frontal glioblastoma resection. Decreased overall enhancement, smaller resection cavity, and less vasogenic edema. Continued surveillance warranted. Electronically Signed   By: Staci Righter M.D.   On: 12/27/2017 09:56   Dg Abd 2 Views  Result Date: 12/18/2017 CLINICAL DATA:  Abdominal pain EXAM: ABDOMEN - 2 VIEW COMPARISON:  None. FINDINGS: The bowel gas pattern is normal. There is no evidence of free air. No radio-opaque calculi or other significant radiographic abnormality is seen. IMPRESSION: Negative. Electronically Signed   By: Ulyses Jarred M.D.   On: 12/18/2017 05:43    Impression/Plan: 1. Glioblastoma of the left frontal lobe. The patient has begun his maintenance Temodar. We will follow him expectantly in our brain oncology conference and see him in clinic as needed as he will continue close follow up with Dr. Mickeal Skinner.     Carola Rhine, PAC

## 2018-01-15 DIAGNOSIS — R569 Unspecified convulsions: Secondary | ICD-10-CM | POA: Diagnosis not present

## 2018-01-15 DIAGNOSIS — D630 Anemia in neoplastic disease: Secondary | ICD-10-CM | POA: Diagnosis not present

## 2018-01-15 DIAGNOSIS — C711 Malignant neoplasm of frontal lobe: Secondary | ICD-10-CM | POA: Diagnosis not present

## 2018-01-15 DIAGNOSIS — N390 Urinary tract infection, site not specified: Secondary | ICD-10-CM | POA: Diagnosis not present

## 2018-01-15 DIAGNOSIS — A4151 Sepsis due to Escherichia coli [E. coli]: Secondary | ICD-10-CM | POA: Diagnosis not present

## 2018-01-15 DIAGNOSIS — J189 Pneumonia, unspecified organism: Secondary | ICD-10-CM | POA: Diagnosis not present

## 2018-01-19 DIAGNOSIS — I2699 Other pulmonary embolism without acute cor pulmonale: Secondary | ICD-10-CM | POA: Diagnosis not present

## 2018-01-19 DIAGNOSIS — D519 Vitamin B12 deficiency anemia, unspecified: Secondary | ICD-10-CM | POA: Diagnosis not present

## 2018-01-19 DIAGNOSIS — C711 Malignant neoplasm of frontal lobe: Secondary | ICD-10-CM | POA: Diagnosis not present

## 2018-01-19 DIAGNOSIS — G40909 Epilepsy, unspecified, not intractable, without status epilepticus: Secondary | ICD-10-CM | POA: Diagnosis not present

## 2018-01-19 DIAGNOSIS — D630 Anemia in neoplastic disease: Secondary | ICD-10-CM | POA: Diagnosis not present

## 2018-01-19 DIAGNOSIS — I82402 Acute embolism and thrombosis of unspecified deep veins of left lower extremity: Secondary | ICD-10-CM | POA: Diagnosis not present

## 2018-01-20 DIAGNOSIS — D630 Anemia in neoplastic disease: Secondary | ICD-10-CM | POA: Diagnosis not present

## 2018-01-20 DIAGNOSIS — I2699 Other pulmonary embolism without acute cor pulmonale: Secondary | ICD-10-CM | POA: Diagnosis not present

## 2018-01-20 DIAGNOSIS — D519 Vitamin B12 deficiency anemia, unspecified: Secondary | ICD-10-CM | POA: Diagnosis not present

## 2018-01-20 DIAGNOSIS — G40909 Epilepsy, unspecified, not intractable, without status epilepticus: Secondary | ICD-10-CM | POA: Diagnosis not present

## 2018-01-20 DIAGNOSIS — I82402 Acute embolism and thrombosis of unspecified deep veins of left lower extremity: Secondary | ICD-10-CM | POA: Diagnosis not present

## 2018-01-20 DIAGNOSIS — C711 Malignant neoplasm of frontal lobe: Secondary | ICD-10-CM | POA: Diagnosis not present

## 2018-01-21 DIAGNOSIS — C711 Malignant neoplasm of frontal lobe: Secondary | ICD-10-CM | POA: Diagnosis not present

## 2018-01-27 ENCOUNTER — Telehealth: Payer: Self-pay | Admitting: *Deleted

## 2018-01-27 NOTE — Telephone Encounter (Signed)
Patients daughter [name] called questioning if her father could take OTC cold medication.  She states since this past Thursday he has been dealing with a common cold, nose running, drainage.  No concerns for fever or muscle aches.  Advised OTC medication is approved and to continue to monitor for fever and if does not improve by Wednesday or Thursday of this week which would be a week then we would need to have him see his PCP or our [facility] office for further evaluation.   Patient daughter expressed complete understanding.

## 2018-01-30 DIAGNOSIS — G40909 Epilepsy, unspecified, not intractable, without status epilepticus: Secondary | ICD-10-CM | POA: Diagnosis not present

## 2018-01-30 DIAGNOSIS — D630 Anemia in neoplastic disease: Secondary | ICD-10-CM | POA: Diagnosis not present

## 2018-01-30 DIAGNOSIS — C711 Malignant neoplasm of frontal lobe: Secondary | ICD-10-CM | POA: Diagnosis not present

## 2018-01-30 DIAGNOSIS — I82402 Acute embolism and thrombosis of unspecified deep veins of left lower extremity: Secondary | ICD-10-CM | POA: Diagnosis not present

## 2018-01-30 DIAGNOSIS — D519 Vitamin B12 deficiency anemia, unspecified: Secondary | ICD-10-CM | POA: Diagnosis not present

## 2018-01-30 DIAGNOSIS — I2699 Other pulmonary embolism without acute cor pulmonale: Secondary | ICD-10-CM | POA: Diagnosis not present

## 2018-02-04 ENCOUNTER — Telehealth: Payer: Self-pay | Admitting: *Deleted

## 2018-02-04 ENCOUNTER — Other Ambulatory Visit: Payer: Self-pay | Admitting: *Deleted

## 2018-02-04 ENCOUNTER — Other Ambulatory Visit: Payer: Self-pay | Admitting: Internal Medicine

## 2018-02-04 ENCOUNTER — Ambulatory Visit: Payer: Non-veteran care | Admitting: Internal Medicine

## 2018-02-04 ENCOUNTER — Other Ambulatory Visit: Payer: Non-veteran care

## 2018-02-04 NOTE — Telephone Encounter (Signed)
Patient no show for appt today. Patient forgot.  His daughter states that they started the 320 mg Temodar yesterday 02/03/2018.  Advised that he has to have blood counts checked prior to started any Chemo.    Per [place] ok to have CBC and CMP checked tomorrow and ok to proceed with Temodar based off of previous platelet levels.  If there is a concerning result from his labs tomorrow then Dr Mickeal Skinner needs to be notified and directions received from him on how to proceed.    His next cycle lab and md appt scheduled and patients daughter is fully aware that they must have the labs and md appt before taking Cycle 3 of Temodar.

## 2018-02-05 ENCOUNTER — Inpatient Hospital Stay: Payer: Non-veteran care | Attending: Internal Medicine

## 2018-02-05 DIAGNOSIS — C711 Malignant neoplasm of frontal lobe: Secondary | ICD-10-CM | POA: Insufficient documentation

## 2018-02-05 DIAGNOSIS — C719 Malignant neoplasm of brain, unspecified: Secondary | ICD-10-CM

## 2018-02-05 LAB — CBC WITH DIFFERENTIAL (CANCER CENTER ONLY)
Basophils Absolute: 0.1 10*3/uL (ref 0.0–0.1)
Basophils Relative: 1 %
EOS ABS: 0.9 10*3/uL — AB (ref 0.0–0.5)
EOS PCT: 12 %
HCT: 36 % — ABNORMAL LOW (ref 38.4–49.9)
Hemoglobin: 11.7 g/dL — ABNORMAL LOW (ref 13.0–17.1)
LYMPHS ABS: 3.1 10*3/uL (ref 0.9–3.3)
Lymphocytes Relative: 40 %
MCH: 29.6 pg (ref 27.2–33.4)
MCHC: 32.5 g/dL (ref 32.0–36.0)
MCV: 91.1 fL (ref 79.3–98.0)
MONO ABS: 0.5 10*3/uL (ref 0.1–0.9)
MONOS PCT: 7 %
Neutro Abs: 3 10*3/uL (ref 1.5–6.5)
Neutrophils Relative %: 40 %
PLATELETS: 194 10*3/uL (ref 140–400)
RBC: 3.95 MIL/uL — ABNORMAL LOW (ref 4.20–5.82)
RDW: 13.7 % (ref 11.0–14.6)
WBC Count: 7.6 10*3/uL (ref 4.0–10.3)

## 2018-02-05 LAB — CMP (CANCER CENTER ONLY)
ALK PHOS: 92 U/L (ref 38–126)
ALT: 9 U/L (ref 0–44)
AST: 15 U/L (ref 15–41)
Albumin: 4 g/dL (ref 3.5–5.0)
Anion gap: 11 (ref 5–15)
BUN: 12 mg/dL (ref 8–23)
CHLORIDE: 104 mmol/L (ref 98–111)
CO2: 27 mmol/L (ref 22–32)
CREATININE: 0.96 mg/dL (ref 0.61–1.24)
Calcium: 9.7 mg/dL (ref 8.9–10.3)
GFR, Est AFR Am: >60 mL/min (ref >60)
GLUCOSE: 110 mg/dL — AB (ref 70–99)
Potassium: 4.6 mmol/L (ref 3.5–5.1)
Sodium: 142 mmol/L (ref 135–145)
Total Bilirubin: 0.4 mg/dL (ref 0.3–1.2)
Total Protein: 7.7 g/dL (ref 6.5–8.1)

## 2018-02-06 DIAGNOSIS — I82402 Acute embolism and thrombosis of unspecified deep veins of left lower extremity: Secondary | ICD-10-CM | POA: Diagnosis not present

## 2018-02-06 DIAGNOSIS — D630 Anemia in neoplastic disease: Secondary | ICD-10-CM | POA: Diagnosis not present

## 2018-02-06 DIAGNOSIS — I2699 Other pulmonary embolism without acute cor pulmonale: Secondary | ICD-10-CM | POA: Diagnosis not present

## 2018-02-06 DIAGNOSIS — C711 Malignant neoplasm of frontal lobe: Secondary | ICD-10-CM | POA: Diagnosis not present

## 2018-02-06 DIAGNOSIS — D519 Vitamin B12 deficiency anemia, unspecified: Secondary | ICD-10-CM | POA: Diagnosis not present

## 2018-02-06 DIAGNOSIS — G40909 Epilepsy, unspecified, not intractable, without status epilepticus: Secondary | ICD-10-CM | POA: Diagnosis not present

## 2018-02-12 DIAGNOSIS — C711 Malignant neoplasm of frontal lobe: Secondary | ICD-10-CM | POA: Diagnosis not present

## 2018-02-12 DIAGNOSIS — D519 Vitamin B12 deficiency anemia, unspecified: Secondary | ICD-10-CM | POA: Diagnosis not present

## 2018-02-12 DIAGNOSIS — G40909 Epilepsy, unspecified, not intractable, without status epilepticus: Secondary | ICD-10-CM | POA: Diagnosis not present

## 2018-02-12 DIAGNOSIS — I2699 Other pulmonary embolism without acute cor pulmonale: Secondary | ICD-10-CM | POA: Diagnosis not present

## 2018-02-12 DIAGNOSIS — D630 Anemia in neoplastic disease: Secondary | ICD-10-CM | POA: Diagnosis not present

## 2018-02-12 DIAGNOSIS — I82402 Acute embolism and thrombosis of unspecified deep veins of left lower extremity: Secondary | ICD-10-CM | POA: Diagnosis not present

## 2018-02-13 DIAGNOSIS — C711 Malignant neoplasm of frontal lobe: Secondary | ICD-10-CM | POA: Diagnosis not present

## 2018-02-13 DIAGNOSIS — I82402 Acute embolism and thrombosis of unspecified deep veins of left lower extremity: Secondary | ICD-10-CM | POA: Diagnosis not present

## 2018-02-13 DIAGNOSIS — G40909 Epilepsy, unspecified, not intractable, without status epilepticus: Secondary | ICD-10-CM | POA: Diagnosis not present

## 2018-02-13 DIAGNOSIS — D630 Anemia in neoplastic disease: Secondary | ICD-10-CM | POA: Diagnosis not present

## 2018-02-13 DIAGNOSIS — I2699 Other pulmonary embolism without acute cor pulmonale: Secondary | ICD-10-CM | POA: Diagnosis not present

## 2018-02-13 DIAGNOSIS — D519 Vitamin B12 deficiency anemia, unspecified: Secondary | ICD-10-CM | POA: Diagnosis not present

## 2018-02-14 DIAGNOSIS — I2699 Other pulmonary embolism without acute cor pulmonale: Secondary | ICD-10-CM | POA: Diagnosis not present

## 2018-02-14 DIAGNOSIS — D519 Vitamin B12 deficiency anemia, unspecified: Secondary | ICD-10-CM | POA: Diagnosis not present

## 2018-02-14 DIAGNOSIS — C711 Malignant neoplasm of frontal lobe: Secondary | ICD-10-CM | POA: Diagnosis not present

## 2018-02-14 DIAGNOSIS — I82402 Acute embolism and thrombosis of unspecified deep veins of left lower extremity: Secondary | ICD-10-CM | POA: Diagnosis not present

## 2018-02-14 DIAGNOSIS — G40909 Epilepsy, unspecified, not intractable, without status epilepticus: Secondary | ICD-10-CM | POA: Diagnosis not present

## 2018-02-14 DIAGNOSIS — D630 Anemia in neoplastic disease: Secondary | ICD-10-CM | POA: Diagnosis not present

## 2018-03-03 ENCOUNTER — Encounter: Payer: Self-pay | Admitting: Internal Medicine

## 2018-03-03 ENCOUNTER — Telehealth: Payer: Self-pay | Admitting: Internal Medicine

## 2018-03-03 ENCOUNTER — Inpatient Hospital Stay (HOSPITAL_BASED_OUTPATIENT_CLINIC_OR_DEPARTMENT_OTHER): Payer: Medicare Other | Admitting: Internal Medicine

## 2018-03-03 ENCOUNTER — Inpatient Hospital Stay: Payer: Medicare Other | Attending: Internal Medicine

## 2018-03-03 VITALS — BP 126/69 | HR 85 | Temp 97.9°F | Resp 18 | Ht 76.0 in | Wt 195.5 lb

## 2018-03-03 DIAGNOSIS — R569 Unspecified convulsions: Secondary | ICD-10-CM

## 2018-03-03 DIAGNOSIS — Z79899 Other long term (current) drug therapy: Secondary | ICD-10-CM | POA: Insufficient documentation

## 2018-03-03 DIAGNOSIS — C719 Malignant neoplasm of brain, unspecified: Secondary | ICD-10-CM

## 2018-03-03 DIAGNOSIS — C711 Malignant neoplasm of frontal lobe: Secondary | ICD-10-CM | POA: Insufficient documentation

## 2018-03-03 DIAGNOSIS — R5383 Other fatigue: Secondary | ICD-10-CM | POA: Diagnosis not present

## 2018-03-03 LAB — CMP (CANCER CENTER ONLY)
ALBUMIN: 4 g/dL (ref 3.5–5.0)
ALT: 11 U/L (ref 0–44)
AST: 14 U/L — AB (ref 15–41)
Alkaline Phosphatase: 87 U/L (ref 38–126)
Anion gap: 9 (ref 5–15)
BILIRUBIN TOTAL: 0.7 mg/dL (ref 0.3–1.2)
BUN: 10 mg/dL (ref 8–23)
CO2: 27 mmol/L (ref 22–32)
CREATININE: 0.97 mg/dL (ref 0.61–1.24)
Calcium: 9.5 mg/dL (ref 8.9–10.3)
Chloride: 105 mmol/L (ref 98–111)
GFR, Est AFR Am: >60 mL/min (ref >60)
GLUCOSE: 125 mg/dL — AB (ref 70–99)
POTASSIUM: 4 mmol/L (ref 3.5–5.1)
Sodium: 141 mmol/L (ref 135–145)
TOTAL PROTEIN: 7.4 g/dL (ref 6.5–8.1)

## 2018-03-03 LAB — CBC WITH DIFFERENTIAL (CANCER CENTER ONLY)
BASOS ABS: 0 10*3/uL (ref 0.0–0.1)
Basophils Relative: 1 %
EOS ABS: 0.4 10*3/uL (ref 0.0–0.5)
EOS PCT: 6 %
HCT: 37 % — ABNORMAL LOW (ref 38.4–49.9)
Hemoglobin: 12.5 g/dL — ABNORMAL LOW (ref 13.0–17.1)
LYMPHS ABS: 2.8 10*3/uL (ref 0.9–3.3)
LYMPHS PCT: 44 %
MCH: 29.4 pg (ref 27.2–33.4)
MCHC: 33.8 g/dL (ref 32.0–36.0)
MCV: 86.9 fL (ref 79.3–98.0)
MONO ABS: 0.5 10*3/uL (ref 0.1–0.9)
Monocytes Relative: 8 %
Neutro Abs: 2.6 10*3/uL (ref 1.5–6.5)
Neutrophils Relative %: 41 %
PLATELETS: 187 10*3/uL (ref 140–400)
RBC: 4.25 MIL/uL (ref 4.20–5.82)
RDW: 14.3 % (ref 11.0–14.6)
WBC Count: 6.3 10*3/uL (ref 4.0–10.3)

## 2018-03-03 MED ORDER — TEMOZOLOMIDE 140 MG PO CAPS: 280.0000 mg | ORAL_CAPSULE | Freq: Every day | ORAL | 0 refills | Status: DC

## 2018-03-03 MED ORDER — TEMOZOLOMIDE 20 MG PO CAPS: 40.0000 mg | ORAL_CAPSULE | Freq: Every day | ORAL | 0 refills | Status: DC

## 2018-03-03 MED ORDER — ONDANSETRON HCL 8 MG PO TABS: 8.0000 mg | ORAL_TABLET | Freq: Two times a day (BID) | ORAL | 1 refills | Status: DC | PRN

## 2018-03-03 NOTE — Telephone Encounter (Signed)
Appts scheduled AVS/Calendar declined per 8/19 los

## 2018-03-03 NOTE — Progress Notes (Signed)
Christopher Morris at [place], [place] [address] [phone]   Interval Evaluation  Date of Service: 03/03/18 Patient Name: Christopher Morris Patient MRN: [id number] Patient DOB: 12-Feb-1941 Provider: Ventura Sellers, MD  Identifying Statement:  Christopher Morris is a 77 y.o. male with left frontal glioblastoma    Oncologic History:   Frontal glioblastoma multiforme ([place])   09/30/2017 Surgery    Craniotomy, resection by Dr. Sherwood Morris.  Path demonstrates glioblastoma    10/28/2017 -  Radiation Therapy    IMRT with concurrent Temodar    01/07/2018 -  Chemotherapy    The patient had [No matching medication found in this treatment plan]  for chemotherapy treatment.      Biomarkers:  MGMT Methylated.  IDH 1/2 Unknown.  EGFR Amplified  EGFRvIII positive   Interval History:  Christopher Morris presents today for follow up after completing second cycle of adjuvant Temodar.  He does have significant fatigue during the week of treatment at the '150mg'$ /m2 dose level.  He is still walking longer distances with his new walker, but also ambulates independently at home.  Otherwise denies seizures, headaches, new or progressive neurologic deficits.    Medications: Current Outpatient Medications on File Prior to Visit  Medication Sig Dispense Refill  . apixaban (ELIQUIS) 5 MG TABS tablet Take 10 mg BID till 12/24/2017, start taking 5 mg BID from 12/25/2017 60 tablet 0  . levETIRAcetam (KEPPRA) 1000 MG tablet Take 1 tablet (1,000 mg total) by mouth 2 (two) times daily. 30 tablet 1  . temozolomide (TEMODAR) 140 MG capsule Take 280 mg by mouth daily. Take with '20mg'$  capsules x 2 for '320mg'$  total daily dose. Take for 5 days on 23 days off, repeat every 28 days. May take on an empty stomach or at bedtime to decrease N/V.    Marland Kitchen temozolomide (TEMODAR) 20 MG capsule Take 40 mg by mouth daily. Take with '140mg'$  capsules x 2 for '320mg'$  total daily dose. Take for 5 days on  23 days off, repeat every 28 days. May take on an empty stomach or at bedtime to decrease N/V.     No current facility-administered medications on file prior to visit.     Allergies:  Allergies  Allergen Reactions  . Pork-Derived Products     Patient does not eat pork  . Shellfish Allergy     Patient does not eat shellfish   Past Medical History:  Past Medical History:  Diagnosis Date  . Brain cancer ([place])   . Glioblastoma ([place]) 09/25/2017  . Hyperglycemia 09/25/2017  . Seizure ([place]) 09/25/2017   Past Surgical History:  Past Surgical History:  Procedure Laterality Date  . APPLICATION OF CRANIAL NAVIGATION N/A 09/30/2017   Procedure: APPLICATION OF CRANIAL NAVIGATION;  Surgeon: Christopher Gamma, MD;  Location: [place];  Service: Neurosurgery;  Laterality: N/A;  . CRANIOTOMY N/A 09/30/2017   Procedure: CRANIOTOMY FOR RESECTION OF BRAIN TUMOR WITH STEREOTACTIC;  Surgeon: Christopher Gamma, MD;  Location: [facility];  Service: Neurosurgery;  Laterality: N/A;  CRANIOTOMY FOR RESECTION OF BRAIN TUMOR WITH STEREOTACTIC   Social History:  Social History   Socioeconomic History  . Marital status: Divorced    Spouse name: Not on file  . Number of children: Not on file  . Years of education: Not on file  . Highest education level: Not on file  Occupational History  . Not on file  Social Needs  . Financial resource strain: Not on file  .  Food insecurity:    Worry: Not on file    Inability: Not on file  . Transportation needs:    Medical: No    Non-medical: No  Tobacco Use  . Smoking status: Never Smoker  . Smokeless tobacco: Never Used  Substance and Sexual Activity  . Alcohol use: No    Frequency: Never  . Drug use: No  . Sexual activity: Not Currently  Lifestyle  . Physical activity:    Days per week: 5 days    Minutes per session: Not on file  . Stress: Only a little  Relationships  . Social connections:    Talks on phone: Not on file    Gets together: Not on file     Attends religious service: Not on file    Active member of club or organization: Not on file    Attends meetings of clubs or organizations: Not on file    Relationship status: Not on file  . Intimate partner violence:    Fear of current or ex partner: Not on file    Emotionally abused: Not on file    Physically abused: Not on file    Forced sexual activity: Not on file  Other Topics Concern  . Not on file  Social History Narrative  . Not on file   Family History:  Family History  Problem Relation Age of Onset  . Hypertension Daughter   . Stroke Mother   . Cancer Neg Hx     Review of Systems: Constitutional: Denies fevers, chills or abnormal weight loss Eyes: Denies blurriness of vision Ears, nose, mouth, throat, and face: Denies mucositis or sore throat Respiratory: Denies cough, dyspnea or wheezes Cardiovascular: Denies palpitation, chest discomfort or lower extremity swelling Gastrointestinal:  Denies nausea, constipation, diarrhea GU: Denies dysuria or incontinence Skin: Denies abnormal skin rashes Neurological: Per HPI Musculoskeletal: Denies joint pain, back or neck discomfort. No decrease in ROM Behavioral/Psych: Denies anxiety, disturbance in thought content, and mood instability  Physical Exam: Vitals:   03/03/18 1120  BP: 126/69  Pulse: 85  Resp: 18  Temp: 97.9 F (36.6 C)  SpO2: 100%   KPS: 80. General: Alert, cooperative, pleasant, in no acute distress Head: Craniotomy scar noted, dry and intact. EENT: No conjunctival injection or scleral icterus. Oral mucosa moist Lungs: Resp effort normal Cardiac: Regular rate and rhythm Abdomen: Soft, non-distended abdomen Skin: No rashes cyanosis or petechiae. Extremities: No clubbing or edema  Neurologic Exam: Mental Status: Awake, alert, attentive to examiner. Oriented to self and environment. Language has mild impairment in fluency with intact comprehension and repetition.  Cranial Nerves: Visual acuity is  grossly normal. Visual fields are full. Extra-ocular movements intact. No ptosis. Face is symmetric, tongue midline. Motor: Tone and bulk are normal. Right arm and leg 5/5, left side 5/5 power. Reflexes are symmetric, no pathologic reflexes present. Intact finger to nose bilaterally Sensory: Intact to light touch and temperature Gait: Independent but purposeful and wide based   Labs: I have reviewed the data as listed    Component Value Date/Time   NA 142 02/05/2018 1341   NA 136 (A) 10/18/2017   K 4.6 02/05/2018 1341   CL 104 02/05/2018 1341   CO2 27 02/05/2018 1341   GLUCOSE 110 (H) 02/05/2018 1341   BUN 12 02/05/2018 1341   BUN 19 10/18/2017   CREATININE 0.96 02/05/2018 1341   CALCIUM 9.7 02/05/2018 1341   PROT 7.7 02/05/2018 1341   ALBUMIN 4.0 02/05/2018 1341   AST  15 02/05/2018 1341   ALT 9 02/05/2018 1341   ALKPHOS 92 02/05/2018 1341   BILITOT 0.4 02/05/2018 1341   GFRNONAA >60 02/05/2018 1341   GFRAA >60 02/05/2018 1341   Lab Results  Component Value Date   WBC 6.3 03/03/2018   NEUTROABS 2.6 03/03/2018   HGB 12.5 (L) 03/03/2018   HCT 37.0 (L) 03/03/2018   MCV 86.9 03/03/2018   PLT 187 03/03/2018    Assessment/Plan 1. Glioblastoma ([place])  2. Seizure [facility])  Mr. Lewelling is clinically stable today.     We recommended continuing treatment with cycle #3 Temozolomide 150 mg/m2, on for five days and off for twenty three days in twenty eight day cycles. The patient will have a complete blood count performed on days 21 and 28 of each cycle, and a comprehensive metabolic panel performed on day 28 of each cycle. Labs may need to be performed more often. Zofran will prescribed for home use for nausea/vomiting.   Chemotherapy should be held for the following:  ANC less than 1,000  Platelets less than 100,000  LFT or creatinine greater than 2x ULN  If clinical concerns/contraindications develop  He should continue Keppra and Eliquis at established doses.  We recommend  he return in 4 weeks with an MRI brain for review.  All questions were answered. The patient knows to call the clinic with any problems, questions or concerns. No barriers to learning were detected.  The total time spent in the encounter was 25 minutes and more than 50% was on counseling and review of test results   Christopher Sellers, MD Medical Director of Neuro-Oncology [facility] at [place] 03/03/18 11:37 AM

## 2018-03-10 ENCOUNTER — Other Ambulatory Visit: Payer: Self-pay | Admitting: Internal Medicine

## 2018-03-10 DIAGNOSIS — C711 Malignant neoplasm of frontal lobe: Secondary | ICD-10-CM

## 2018-03-10 MED ORDER — TEMOZOLOMIDE 140 MG PO CAPS: 280.0000 mg | ORAL_CAPSULE | Freq: Every day | ORAL | 0 refills | Status: DC

## 2018-03-10 MED ORDER — TEMOZOLOMIDE 20 MG PO CAPS: 40.0000 mg | ORAL_CAPSULE | Freq: Every day | ORAL | 0 refills | Status: DC

## 2018-03-13 ENCOUNTER — Telehealth: Payer: Self-pay | Admitting: *Deleted

## 2018-03-13 ENCOUNTER — Other Ambulatory Visit: Payer: Self-pay | Admitting: Internal Medicine

## 2018-03-13 ENCOUNTER — Telehealth: Payer: Self-pay

## 2018-03-13 NOTE — Telephone Encounter (Signed)
Oral Oncology Patient Advocate Encounter  Dr. Reche Dixon nurse called and made me aware of the patient no longer having prescription insurance and [employer] copay is very high.  [employer] also called me to find out new insurance information. I did a benefit investigation just to be sure during this call and notified [employer] that he has no prescription insurance and I was helping with patient assistance through the manufacturer. [employer] has cancelled the prescription at this time.  I am currently working on a patient assistance application for him so that he can get his Temodar at no cost through the manufacturer.  I called the patients daughter, [name] to get income information and have them come in to sign the form. I had to leave a message.  I will continue to update here during this process.  Annetta North Patient [place] Phone [phone] Fax [phone]

## 2018-03-13 NOTE — Telephone Encounter (Signed)
Patients daughter called regarding no receiving Temodar prescription yet.  Provided confirmation receipt with Pharmacy and their phone number.  She returned call to advise that the speciality pharmacy has not refilled his Rx yet because he recently lost his commercial insurance and was switched to Medicare A & B and no prescription insurance.  The out of pocket cost for them is going to over $3,600.  They are unable to afford this.   Contacted Oral Oncology Pharmacist.  They will reach out to patient and see if they qualify for manufacturer assistance.

## 2018-03-13 NOTE — Telephone Encounter (Signed)
Oral Oncology Patient Advocate Encounter  [name] called back and is coming today to sign application and bring SS benefit letter.  I will put the application for the prescribers signature in Dr. Reche Dixon folder and when I get all of this together I will fax the application today.   [place] Patient [place] Phone [phone] Fax [phone]

## 2018-03-13 NOTE — Telephone Encounter (Signed)
Oral Oncology Patient Advocate Encounter  Documents have been faxed.  Pending reply from [place] Patient [place] Phone [phone] Fax [phone]

## 2018-03-14 NOTE — Telephone Encounter (Signed)
Oral Oncology Patient Advocate Encounter  [employer] called to inform me that Temodar has been approved and will be shipped to the patient in 3-5 business days.  Approved 03/14/18-03/14/19  I called and left a message for [name] (daughter) with the above information.  [place] Patient [place] Phone [phone] Fax [phone]

## 2018-03-21 NOTE — Telephone Encounter (Signed)
Oral Oncology Patient Advocate Encounter  Confirmed with [name] (patients daughter) that the medicine arrived at his home 03/20/18.  She expressed great appreciation  [place] Patient Christopher Morris Phone [phone] Fax [phone]

## 2018-03-26 ENCOUNTER — Other Ambulatory Visit: Payer: Self-pay | Admitting: Radiation Therapy

## 2018-03-27 ENCOUNTER — Ambulatory Visit (HOSPITAL_COMMUNITY)
Admission: RE | Admit: 2018-03-27 | Discharge: 2018-03-27 | Disposition: A | Discharge status: Home / Self Care | Payer: Non-veteran care | Source: Ambulatory Visit | Attending: Internal Medicine | Admitting: Internal Medicine

## 2018-03-27 DIAGNOSIS — C719 Malignant neoplasm of brain, unspecified: Secondary | ICD-10-CM | POA: Diagnosis present

## 2018-03-27 MED ORDER — GADOBUTROL 1 MMOL/ML IV SOLN
9.0000 mL | Freq: Once | INTRAVENOUS | Status: AC | PRN
  Administered 2018-03-27: 9 mL via INTRAVENOUS

## 2018-03-31 ENCOUNTER — Other Ambulatory Visit: Payer: Non-veteran care

## 2018-04-01 NOTE — Progress Notes (Signed)
Brain and Spine Tumor Board Documentation  Daris Harkins was presented by Cecil Cobbs, MD at Brain and Spine Tumor Board on 04/01/2018, which included representatives from neuro oncology, radiation oncology, surgical oncology, genetics, navigation, pathology, radiology.  Amontae was presented as a current patient with history of the following treatments:  .  Additionally, we reviewed previous medical and familial history, history of present illness, and recent lab results along with all available histopathologic and imaging studies. The tumor board considered available treatment options and made the following recommendations:  Additional screening  Tumor progression vs pseudoprogression.  Repeat MRI in 4-6 weeks  Tumor board is a meeting of clinicians from various specialty areas who evaluate and discuss patients for whom a multidisciplinary approach is being considered. Final determinations in the plan of care are those of the provider(s). The responsibility for follow up of recommendations given during tumor board is that of the provider.   Today's extended care, comprehensive team conference, Kahari was not present for the discussion and was not examined.

## 2018-04-02 ENCOUNTER — Inpatient Hospital Stay: Payer: Medicare Other | Attending: Internal Medicine

## 2018-04-02 ENCOUNTER — Inpatient Hospital Stay (HOSPITAL_BASED_OUTPATIENT_CLINIC_OR_DEPARTMENT_OTHER): Payer: Medicare Other | Admitting: Internal Medicine

## 2018-04-02 ENCOUNTER — Telehealth: Payer: Self-pay

## 2018-04-02 VITALS — BP 130/55 | HR 73 | Temp 97.7°F | Resp 18 | Ht 76.0 in | Wt 199.4 lb

## 2018-04-02 DIAGNOSIS — C711 Malignant neoplasm of frontal lobe: Secondary | ICD-10-CM

## 2018-04-02 DIAGNOSIS — Z79899 Other long term (current) drug therapy: Secondary | ICD-10-CM | POA: Insufficient documentation

## 2018-04-02 DIAGNOSIS — C719 Malignant neoplasm of brain, unspecified: Secondary | ICD-10-CM

## 2018-04-02 LAB — CBC WITH DIFFERENTIAL (CANCER CENTER ONLY)
BASOS ABS: 0 10*3/uL (ref 0.0–0.1)
Basophils Relative: 0 %
Eosinophils Absolute: 0.4 10*3/uL (ref 0.0–0.5)
Eosinophils Relative: 6 %
HEMATOCRIT: 37.2 % — AB (ref 38.4–49.9)
Hemoglobin: 12.4 g/dL — ABNORMAL LOW (ref 13.0–17.1)
LYMPHS ABS: 2.8 10*3/uL (ref 0.9–3.3)
LYMPHS PCT: 41 %
MCH: 29 pg (ref 27.2–33.4)
MCHC: 33.3 g/dL (ref 32.0–36.0)
MCV: 86.9 fL (ref 79.3–98.0)
Monocytes Absolute: 0.5 10*3/uL (ref 0.1–0.9)
Monocytes Relative: 7 %
NEUTROS ABS: 3.2 10*3/uL (ref 1.5–6.5)
NEUTROS PCT: 46 %
PLATELETS: 181 10*3/uL (ref 140–400)
RBC: 4.28 MIL/uL (ref 4.20–5.82)
RDW: 13.5 % (ref 11.0–14.6)
WBC: 7 10*3/uL (ref 4.0–10.3)

## 2018-04-02 LAB — CMP (CANCER CENTER ONLY)
ALT: 9 U/L (ref 0–44)
ANION GAP: 8 (ref 5–15)
AST: 12 U/L — ABNORMAL LOW (ref 15–41)
Albumin: 3.9 g/dL (ref 3.5–5.0)
Alkaline Phosphatase: 86 U/L (ref 38–126)
BUN: 11 mg/dL (ref 8–23)
CHLORIDE: 106 mmol/L (ref 98–111)
CO2: 28 mmol/L (ref 22–32)
Calcium: 9.7 mg/dL (ref 8.9–10.3)
Creatinine: 1.02 mg/dL (ref 0.61–1.24)
Glucose, Bld: 137 mg/dL — ABNORMAL HIGH (ref 70–99)
POTASSIUM: 4.3 mmol/L (ref 3.5–5.1)
SODIUM: 142 mmol/L (ref 135–145)
Total Bilirubin: 0.8 mg/dL (ref 0.3–1.2)
Total Protein: 7.1 g/dL (ref 6.5–8.1)

## 2018-04-02 MED ORDER — ONDANSETRON HCL 8 MG PO TABS: 8.0000 mg | ORAL_TABLET | Freq: Two times a day (BID) | ORAL | 1 refills | Status: DC | PRN

## 2018-04-02 NOTE — Progress Notes (Signed)
[facility] [address], [place] [address] [phone]   Interval Evaluation  Date of Service: 04/02/18 Patient Name: Christopher Morris Patient MRN: [id number] Patient DOB: [birth year] Provider: Ventura Sellers, MD  Identifying Statement:  Christopher Morris is a 77 y.o. male with left frontal glioblastoma    Oncologic History:   Frontal glioblastoma multiforme ([place])   09/30/2017 Surgery    Craniotomy, resection by Dr. Sherwood Gambler.  Path demonstrates glioblastoma    10/28/2017 -  Radiation Therapy    IMRT with concurrent Temodar    01/07/2018 -  Chemotherapy    The patient had [No matching medication found in this treatment plan]  for chemotherapy treatment.      Biomarkers:  MGMT Methylated.  IDH 1/2 Unknown.  EGFR Amplified  EGFRvIII positive   Interval History:  Christopher Morris presents today for follow up after completing third cycle of adjuvant Temodar.  He again had significant fatigue during the week of treatment at the 172m/m2 dose level.  Walking with his new walker, but also ambulates independently at home.  Had dry cough which is resolving. Otherwise denies seizures, headaches, new or progressive neurologic deficits.    Medications: Current Outpatient Medications on File Prior to Visit  Medication Sig Dispense Refill  . apixaban (ELIQUIS) 5 MG TABS tablet Take 10 mg BID till 12/24/2017, start taking 5 mg BID from 12/25/2017 60 tablet 0  . levETIRAcetam (KEPPRA) 1000 MG tablet Take 1 tablet (1,000 mg total) by mouth 2 (two) times daily. 30 tablet 1  . ondansetron (ZOFRAN) 8 MG tablet Take 1 tablet (8 mg total) by mouth 2 (two) times daily as needed. Start on the third day after chemotherapy. 30 tablet 1  . temozolomide (TEMODAR) 140 MG capsule Take 280 mg by mouth daily. Take with 232mcapsules x 2 for 32062motal daily dose. Take for 5 days on 23 days off, repeat every 28 days. May take on an empty stomach or at bedtime  to decrease N/V.    . tMarland Kitchenmozolomide (TEMODAR) 140 MG capsule Take 2 capsules (280 mg total) by mouth daily. May take on an empty stomach to decrease nausea & vomiting. 10 capsule 0  . temozolomide (TEMODAR) 140 MG capsule Take 2 capsules (280 mg total) by mouth daily. May take on an empty stomach to decrease nausea & vomiting. 10 capsule 0  . temozolomide (TEMODAR) 20 MG capsule Take 40 mg by mouth daily. Take with 140m43mpsules x 2 for 320mg54mal daily dose. Take for 5 days on 23 days off, repeat every 28 days. May take on an empty stomach or at bedtime to decrease N/V.    . temMarland Kitchenzolomide (TEMODAR) 20 MG capsule Take 2 capsules (40 mg total) by mouth daily. May take on an empty stomach to decrease nausea & vomiting. 10 capsule 0  . temozolomide (TEMODAR) 20 MG capsule Take 2 capsules (40 mg total) by mouth daily. May take on an empty stomach to decrease nausea & vomiting. 10 capsule 0   No current facility-administered medications on file prior to visit.     Allergies:  Allergies  Allergen Reactions  . Pork-Derived Products     Patient does not eat pork  . Shellfish Allergy     Patient does not eat shellfish   Past Medical History:  Past Medical History:  Diagnosis Date  . Brain cancer (HCC) [place] Glioblastoma (HCC) [place]3/2019  . Hyperglycemia 09/25/2017  . Seizure (HCC) [place]3/2019  Past Surgical History:  Past Surgical History:  Procedure Laterality Date  . APPLICATION OF CRANIAL NAVIGATION N/A 09/30/2017   Procedure: APPLICATION OF CRANIAL NAVIGATION;  Surgeon: [name], MD;  Location: [facility];  Service: Neurosurgery;  Laterality: N/A;  . CRANIOTOMY N/A 09/30/2017   Procedure: CRANIOTOMY FOR RESECTION OF BRAIN TUMOR WITH STEREOTACTIC;  Surgeon: [name], MD;  Location: [facility];  Service: Neurosurgery;  Laterality: N/A;  CRANIOTOMY FOR RESECTION OF BRAIN TUMOR WITH STEREOTACTIC   Social History:  Social History   Socioeconomic History  . Marital status: Divorced     Spouse name: Not on file  . Number of children: Not on file  . Years of education: Not on file  . Highest education level: Not on file  Occupational History  . Not on file  Social Needs  . Financial resource strain: Not on file  . Food insecurity:    Worry: Not on file    Inability: Not on file  . Transportation needs:    Medical: No    Non-medical: No  Tobacco Use  . Smoking status: Never Smoker  . Smokeless tobacco: Never Used  Substance and Sexual Activity  . Alcohol use: No    Frequency: Never  . Drug use: No  . Sexual activity: Not Currently  Lifestyle  . Physical activity:    Days per week: 5 days    Minutes per session: Not on file  . Stress: Only a little  Relationships  . Social connections:    Talks on phone: Not on file    Gets together: Not on file    Attends religious service: Not on file    Active member of club or organization: Not on file    Attends meetings of clubs or organizations: Not on file    Relationship status: Not on file  . Intimate partner violence:    Fear of current or ex partner: Not on file    Emotionally abused: Not on file    Physically abused: Not on file    Forced sexual activity: Not on file  Other Topics Concern  . Not on file  Social History Narrative  . Not on file   Family History:  Family History  Problem Relation Age of Onset  . Hypertension Daughter   . Stroke Mother   . Cancer Neg Hx     Review of Systems: Constitutional: Denies fevers, chills or abnormal weight loss Eyes: Denies blurriness of vision Ears, nose, mouth, throat, and face: Denies mucositis or sore throat Respiratory: Denies cough, dyspnea or wheezes Cardiovascular: Denies palpitation, chest discomfort or lower extremity swelling Gastrointestinal:  Denies nausea, constipation, diarrhea GU: Denies dysuria or incontinence Skin: Denies abnormal skin rashes Neurological: Per HPI Musculoskeletal: Denies joint pain, back or neck discomfort. No decrease  in ROM Behavioral/Psych: Denies anxiety, disturbance in thought content, and mood instability  Physical Exam: Vitals:   04/02/18 1117  BP: (!) 130/55  Pulse: 73  Resp: 18  Temp: 97.7 F (36.5 C)  SpO2: 100%   KPS: 80. General: Alert, cooperative, pleasant, in no acute distress Head: Craniotomy scar noted, dry and intact. EENT: No conjunctival injection or scleral icterus. Oral mucosa moist Lungs: Resp effort normal Cardiac: Regular rate and rhythm Abdomen: Soft, non-distended abdomen Skin: No rashes cyanosis or petechiae. Extremities: No clubbing or edema  Neurologic Exam: Mental Status: Awake, alert, attentive to examiner. Oriented to self and environment. Language has mild impairment in fluency with intact comprehension and repetition.  Cranial Nerves:  Visual acuity is grossly normal. Visual fields are full. Extra-ocular movements intact. No ptosis. Face is symmetric, tongue midline. Motor: Tone and bulk are normal. Pronator drift right arm, left side 5/5 power. Reflexes are symmetric, no pathologic reflexes present. Intact finger to nose bilaterally Sensory: Intact to light touch and temperature Gait: Independent but purposeful and wide based   Labs: I have reviewed the data as listed    Component Value Date/Time   NA 142 04/02/2018 1049   NA 136 (A) 10/18/2017   K 4.3 04/02/2018 1049   CL 106 04/02/2018 1049   CO2 28 04/02/2018 1049   GLUCOSE 137 (H) 04/02/2018 1049   BUN 11 04/02/2018 1049   BUN 19 10/18/2017   CREATININE 1.02 04/02/2018 1049   CALCIUM 9.7 04/02/2018 1049   PROT 7.1 04/02/2018 1049   ALBUMIN 3.9 04/02/2018 1049   AST 12 (L) 04/02/2018 1049   ALT 9 04/02/2018 1049   ALKPHOS 86 04/02/2018 1049   BILITOT 0.8 04/02/2018 1049   GFRNONAA >60 04/02/2018 1049   GFRAA >60 04/02/2018 1049   Lab Results  Component Value Date   WBC 7.0 04/02/2018   NEUTROABS 3.2 04/02/2018   HGB 12.4 (L) 04/02/2018   HCT 37.2 (L) 04/02/2018   MCV 86.9 04/02/2018     PLT 181 04/02/2018   Imaging:  [facility] Clinician Interpretation: I have personally reviewed the CNS images as listed.  My interpretation, in the context of the patient's clinical presentation, is treatment effect vs true progression  Mr Christopher Morris Wo Contrast  Result Date: 03/27/2018 CLINICAL DATA:  Glioblastoma. EXAM: MRI HEAD WITHOUT AND WITH CONTRAST TECHNIQUE: Multiplanar, multiecho pulse sequences of the brain and surrounding structures were obtained without and with intravenous contrast. CONTRAST:  9 mL Gadavist COMPARISON:  MRI brain 12/27/2017 and 10/02/2017. FINDINGS: Brain: The patient is status post left frontal craniotomy. Residual enhancement along the medial aspect of the resection cavity demonstrates interval increase in size, now measuring 2.7 x 1.7 x 3.2 cm in maximal dimensions. Surrounding T2 signal changes have increased since the prior study. Susceptibility associated with blood products in the surgical cavity is stable. No new enhancing lesions are present. No acute infarct is present. There is no new hemorrhage. There is some increase in right periventricular white matter changes as well. T2 signal changes extending into the basal ganglia have increased. Dilated perivascular spaces are present in the basal ganglia. The brainstem and cerebellum are normal. The internal auditory canals are within normal limits bilaterally. Vascular: Flow is present in the major intracranial arteries. Skull and upper cervical spine: The skull base is within normal limits. Moderate degenerative changes are again seen in the upper cervical spine. Marrow signal is within limits. Sinuses/Orbits: The paranasal sinuses and mastoid air cells are clear. Globes and orbits are within normal limits. IMPRESSION: 1. Increasing size of enhancing region along the medial margin of the surgical cavity is concerning for tumor recurrence. 2. Increased T2 signal surrounding the surgical cavity. There is diffuse increased T2  signal which is likely related to radiation therapy. The area immediately surrounding the surgical cavity may be treatment related as well. In the setting of GBM, this raises concern for recurrent tumor as well. 3. No acute intracranial abnormality. Electronically Signed   By: San Morelle M.D.   On: 03/27/2018 15:01    Assessment/Plan 1. Glioblastoma ([place])  2. Seizure [facility])  Christopher Morris is clinically stable today.  He has changes on MRI which are consistent with either tumor  progression or pseudoprogression/inflammation from radiation therapy.      We recommended continuing treatment with cycle #4 Temozolomide 150 mg/m2, on for five days and off for twenty three days in twenty eight day cycles. The patient will have a complete blood count performed on days 21 and 28 of each cycle, and a comprehensive metabolic panel performed on day 28 of each cycle. Labs may need to be performed more often. Zofran will prescribed for home use for nausea/vomiting.   Chemotherapy should be held for the following:  ANC less than 1,000  Platelets less than 100,000  LFT or creatinine greater than 2x ULN  If clinical concerns/contraindications develop  He should continue Keppra and Eliquis at established doses.  We recommend he return in 6 weeks with an MRI brain for review.  At that time will more clearly characterize the nature of changes seen on imaging today.  All questions were answered. The patient knows to call the clinic with any problems, questions or concerns. No barriers to learning were detected.  The total time spent in the encounter was 25 minutes and more than 50% was on counseling and review of test results   Ventura Sellers, MD Medical Director of Neuro-Oncology [facility] 04/02/18 11:47 AM

## 2018-04-02 NOTE — Telephone Encounter (Signed)
Printed avs and calender of upcoming appointment. Per 9/18 los 

## 2018-04-04 ENCOUNTER — Telehealth: Payer: Self-pay | Admitting: *Deleted

## 2018-04-04 NOTE — Telephone Encounter (Signed)
Returned [employer] phone call and left a message for her to call [place] back.

## 2018-04-05 ENCOUNTER — Emergency Department (HOSPITAL_COMMUNITY)
Admission: EM | Admit: 2018-04-05 | Discharge: 2018-04-05 | Disposition: A | Discharge status: Home / Self Care | Payer: Non-veteran care | Attending: Emergency Medicine | Admitting: Emergency Medicine

## 2018-04-05 ENCOUNTER — Encounter (HOSPITAL_COMMUNITY): Payer: Self-pay | Admitting: *Deleted

## 2018-04-05 DIAGNOSIS — K625 Hemorrhage of anus and rectum: Secondary | ICD-10-CM

## 2018-04-05 DIAGNOSIS — K6289 Other specified diseases of anus and rectum: Secondary | ICD-10-CM | POA: Diagnosis present

## 2018-04-05 DIAGNOSIS — Z85841 Personal history of malignant neoplasm of brain: Secondary | ICD-10-CM | POA: Insufficient documentation

## 2018-04-05 DIAGNOSIS — Z79899 Other long term (current) drug therapy: Secondary | ICD-10-CM | POA: Insufficient documentation

## 2018-04-05 LAB — CBC WITH DIFFERENTIAL/PLATELET
BASOS PCT: 0 %
Basophils Absolute: 0 10*3/uL (ref 0.0–0.1)
EOS ABS: 0.2 10*3/uL (ref 0.0–0.7)
EOS PCT: 3 %
HCT: 36.1 % — ABNORMAL LOW (ref 39.0–52.0)
HEMOGLOBIN: 12.2 g/dL — AB (ref 13.0–17.0)
Lymphocytes Relative: 32 %
Lymphs Abs: 2.2 10*3/uL (ref 0.7–4.0)
MCH: 29.3 pg (ref 26.0–34.0)
MCHC: 33.8 g/dL (ref 30.0–36.0)
MCV: 86.8 fL (ref 78.0–100.0)
MONOS PCT: 9 %
Monocytes Absolute: 0.6 10*3/uL (ref 0.1–1.0)
NEUTROS PCT: 56 %
Neutro Abs: 3.8 10*3/uL (ref 1.7–7.7)
PLATELETS: 183 10*3/uL (ref 150–400)
RBC: 4.16 MIL/uL — ABNORMAL LOW (ref 4.22–5.81)
RDW: 13.5 % (ref 11.5–15.5)
WBC: 6.8 10*3/uL (ref 4.0–10.5)

## 2018-04-05 LAB — COMPREHENSIVE METABOLIC PANEL
ALK PHOS: 69 U/L (ref 38–126)
ALT: 12 U/L (ref 0–44)
AST: 19 U/L (ref 15–41)
Albumin: 3.9 g/dL (ref 3.5–5.0)
Anion gap: 11 (ref 5–15)
BUN: 13 mg/dL (ref 8–23)
CALCIUM: 9.2 mg/dL (ref 8.9–10.3)
CO2: 25 mmol/L (ref 22–32)
CREATININE: 0.88 mg/dL (ref 0.61–1.24)
Chloride: 107 mmol/L (ref 98–111)
Glucose, Bld: 127 mg/dL — ABNORMAL HIGH (ref 70–99)
Potassium: 4 mmol/L (ref 3.5–5.1)
SODIUM: 143 mmol/L (ref 135–145)
Total Bilirubin: 0.6 mg/dL (ref 0.3–1.2)
Total Protein: 6.7 g/dL (ref 6.5–8.1)

## 2018-04-05 LAB — SAMPLE TO BLOOD BANK

## 2018-04-05 LAB — PROTIME-INR
INR: 1.15
Prothrombin Time: 14.6 seconds (ref 11.4–15.2)

## 2018-04-05 NOTE — ED Triage Notes (Addendum)
Pt complains of blood in stool since midnight last night. Pt complains of pain in his buttocks. Pt has glioblastoma, is receiving chemotherapy.

## 2018-04-05 NOTE — Discharge Instructions (Addendum)
Watch for increased rectal bleeding.  Watch for lightheadedness or dizziness.  Follow-up in 2 to 3 days for hemoglobin recheck.  Return for fevers.

## 2018-04-05 NOTE — ED Provider Notes (Signed)
[facility] DEPT Provider Note   CSN: [id number] Arrival date & time: 04/05/18  1221     History   Chief Complaint Chief Complaint  Patient presents with  . Rectal Bleeding    HPI Christopher Morris is a 77 y.o. male.  HPI Patient presents with rectal bleeding.  On anticoagulation for previous pulmonary embolism and glioblastoma.  Reportedly had a grape size clot in his stool since last night.  Complaining of some rectal pain.  Denies fevers.  No other bleeding.  Had some blood with the stool also on the second bowel movement. Past Medical History:  Diagnosis Date  . Brain cancer ([place])   . Glioblastoma ([place]) 09/25/2017  . Hyperglycemia 09/25/2017  . Seizure ([place]) 09/25/2017    Patient Active Problem List   Diagnosis Date Noted  . Pneumonia 12/22/2017  . HCAP (healthcare-associated pneumonia) 12/22/2017  . Pulmonary embolism ([place]) 12/22/2017  . Tachycardia   . Vitamin B12 deficiency 11/14/2017  . Anemia 11/14/2017  . Fever 11/13/2017  . Falls 11/13/2017  . Dehydration 11/13/2017  . Interstitial pneumonia ([place]) 11/11/2017  . Sepsis ([place]) 11/09/2017  . Bacteremia due to Gram-negative bacteria 10/26/2017  . Sepsis due to Escherichia coli (E. coli) ([place]) 10/26/2017  . UTI (urinary tract infection) 10/25/2017  . Seizure ([place]) 09/25/2017  . Frontal glioblastoma multiforme ([place]) 09/25/2017  . Hyperglycemia 09/25/2017  . BLOOD IN STOOL 07/26/2008  . ERECTILE DYSFUNCTION 07/12/2008    Past Surgical History:  Procedure Laterality Date  . APPLICATION OF CRANIAL NAVIGATION N/A 09/30/2017   Procedure: APPLICATION OF CRANIAL NAVIGATION;  Surgeon: [name], MD;  Location: [facility];  Service: Neurosurgery;  Laterality: N/A;  . CRANIOTOMY N/A 09/30/2017   Procedure: CRANIOTOMY FOR RESECTION OF BRAIN TUMOR WITH STEREOTACTIC;  Surgeon: [name], MD;  Location: [facility];  Service: Neurosurgery;  Laterality: N/A;  CRANIOTOMY FOR RESECTION OF BRAIN  TUMOR WITH STEREOTACTIC        Home Medications    Prior to Admission medications   Medication Sig Start Date End Date Taking? Authorizing Provider  apixaban (ELIQUIS) 5 MG TABS tablet Take 10 mg BID till 12/24/2017, start taking 5 mg BID from 12/25/2017 12/20/17  Yes Lavina Hamman, MD  levETIRAcetam (KEPPRA) 1000 MG tablet Take 1 tablet (1,000 mg total) by mouth 2 (two) times daily. 11/26/17  Yes Vaslow, Acey Lav, MD  ondansetron (ZOFRAN) 8 MG tablet Take 1 tablet (8 mg total) by mouth 2 (two) times daily as needed. Start on the third day after chemotherapy. 03/03/18  Yes Vaslow, Acey Lav, MD  temozolomide (TEMODAR) 140 MG capsule Take 2 capsules (280 mg total) by mouth daily. May take on an empty stomach to decrease nausea & vomiting. 03/03/18  Yes Vaslow, Acey Lav, MD  temozolomide (TEMODAR) 20 MG capsule Take 2 capsules (40 mg total) by mouth daily. May take on an empty stomach to decrease nausea & vomiting. 03/03/18  Yes Vaslow, Acey Lav, MD    Family History Family History  Problem Relation Age of Onset  . Hypertension Daughter   . Stroke Mother   . Cancer Neg Hx     Social History Social History   Tobacco Use  . Smoking status: Never Smoker  . Smokeless tobacco: Never Used  Substance Use Topics  . Alcohol use: No    Frequency: Never  . Drug use: No     Allergies   Pork-derived products and Shellfish allergy   Review of Systems Review of Systems  Constitutional:  Negative for appetite change.  HENT: Negative for congestion.   Respiratory: Negative for shortness of breath.   Gastrointestinal: Positive for blood in stool and rectal pain.  Genitourinary: Negative for flank pain.  Musculoskeletal: Negative for back pain.  Neurological: Negative for weakness.  Hematological: Negative for adenopathy.  Psychiatric/Behavioral: Negative for confusion.     Physical Exam Updated Vital Signs BP 131/74   Pulse 75   Temp 97.6 F (36.4 C) (Oral)   Resp 18   SpO2  100%   Physical Exam  Constitutional: He appears well-developed.  HENT:  Head: Atraumatic.  Eyes: Right eye exhibits no discharge. Left eye exhibits no discharge.  Neck: Neck supple.  Cardiovascular: Normal rate.  Pulmonary/Chest: Effort normal.  Abdominal: Soft.  Genitourinary:  Genitourinary Comments: Large amount of stool at fault.  Some gross blood on finger.  Stool appears brown.  Neurological: He is alert.  Chronic right-sided weakness.  Skin: Skin is warm. Capillary refill takes less than 2 seconds.     ED Treatments / Results  Labs (all labs ordered are listed, but only abnormal results are displayed) Labs Reviewed  COMPREHENSIVE METABOLIC PANEL - Abnormal; Notable for the following components:      Result Value   Glucose, Bld 127 (*)    All other components within normal limits  CBC WITH DIFFERENTIAL/PLATELET - Abnormal; Notable for the following components:   RBC 4.16 (*)    Hemoglobin 12.2 (*)    HCT 36.1 (*)    All other components within normal limits  PROTIME-INR  SAMPLE TO BLOOD BANK    EKG None  Radiology No results found.  Procedures Procedures (including critical care time)  Medications Ordered in ED Medications - No data to display   Initial Impression / Assessment and Plan / ED Course  I have reviewed the triage vital signs and the nursing notes.  Pertinent labs & imaging results that were available during my care of the patient were reviewed by me and considered in my medical decision making (see chart for details).     Patient with rectal bleeding.  On anticoagulation.  However well-appearing and benign exam.  Hemoglobin at baseline.  Discussed with patient and  Final Clinical Impressions(s) / ED Diagnoses   Final diagnoses:  Rectal bleeding    ED Discharge Orders    None       Davonna Belling, MD 04/05/18 [phone]

## 2018-04-07 ENCOUNTER — Other Ambulatory Visit: Payer: Self-pay | Admitting: *Deleted

## 2018-04-07 ENCOUNTER — Encounter: Payer: Self-pay | Admitting: Internal Medicine

## 2018-04-07 DIAGNOSIS — C711 Malignant neoplasm of frontal lobe: Secondary | ICD-10-CM

## 2018-04-14 ENCOUNTER — Other Ambulatory Visit: Payer: Self-pay | Admitting: *Deleted

## 2018-04-14 DIAGNOSIS — C719 Malignant neoplasm of brain, unspecified: Secondary | ICD-10-CM

## 2018-05-15 ENCOUNTER — Other Ambulatory Visit: Payer: Self-pay | Admitting: Radiation Therapy

## 2018-05-16 ENCOUNTER — Ambulatory Visit (HOSPITAL_COMMUNITY)
Admission: RE | Admit: 2018-05-16 | Discharge: 2018-05-16 | Disposition: A | Discharge status: Home / Self Care | Payer: Medicare Other | Source: Ambulatory Visit | Attending: Internal Medicine | Admitting: Internal Medicine

## 2018-05-16 DIAGNOSIS — C711 Malignant neoplasm of frontal lobe: Secondary | ICD-10-CM | POA: Insufficient documentation

## 2018-05-16 DIAGNOSIS — C719 Malignant neoplasm of brain, unspecified: Secondary | ICD-10-CM | POA: Diagnosis not present

## 2018-05-16 MED ORDER — GADOBUTROL 1 MMOL/ML IV SOLN
9.0000 mL | Freq: Once | INTRAVENOUS | Status: AC | PRN
  Administered 2018-05-16: 9 mL via INTRAVENOUS

## 2018-05-19 ENCOUNTER — Other Ambulatory Visit: Payer: Non-veteran care

## 2018-05-20 DIAGNOSIS — C711 Malignant neoplasm of frontal lobe: Secondary | ICD-10-CM | POA: Diagnosis not present

## 2018-05-20 DIAGNOSIS — R030 Elevated blood-pressure reading, without diagnosis of hypertension: Secondary | ICD-10-CM | POA: Diagnosis not present

## 2018-05-20 NOTE — Progress Notes (Signed)
Brain and Spine Tumor Board Documentation  Morio Widen was presented by Cecil Cobbs, MD at Brain and Spine Tumor Board on 05/20/2018, which included representatives from neuro oncology, radiation oncology, surgical oncology, navigation, pathology, radiology.  [place] was presented as a current patient with history of the following treatments:  .  Additionally, we reviewed previous medical and familial history, history of present illness, and recent lab results along with all available histopathologic and imaging studies. The tumor board considered available treatment options and made the following recommendations:  Chemotherapy    Tumor board is a meeting of clinicians from various specialty areas who evaluate and discuss patients for whom a multidisciplinary approach is being considered. Final determinations in the plan of care are those of the provider(s). The responsibility for follow up of recommendations given during tumor board is that of the provider.   Today's extended care, comprehensive team conference, [place] was not present for the discussion and was not examined.

## 2018-05-21 ENCOUNTER — Other Ambulatory Visit: Payer: Self-pay | Admitting: *Deleted

## 2018-05-21 ENCOUNTER — Inpatient Hospital Stay: Payer: Medicare Other | Attending: Internal Medicine

## 2018-05-21 ENCOUNTER — Inpatient Hospital Stay (HOSPITAL_BASED_OUTPATIENT_CLINIC_OR_DEPARTMENT_OTHER): Payer: Medicare Other | Admitting: Internal Medicine

## 2018-05-21 VITALS — BP 131/61 | HR 67 | Temp 97.7°F | Resp 18 | Ht 76.0 in | Wt 199.1 lb

## 2018-05-21 DIAGNOSIS — R569 Unspecified convulsions: Secondary | ICD-10-CM | POA: Diagnosis not present

## 2018-05-21 DIAGNOSIS — C719 Malignant neoplasm of brain, unspecified: Secondary | ICD-10-CM

## 2018-05-21 DIAGNOSIS — C711 Malignant neoplasm of frontal lobe: Secondary | ICD-10-CM

## 2018-05-21 DIAGNOSIS — Z79899 Other long term (current) drug therapy: Secondary | ICD-10-CM | POA: Insufficient documentation

## 2018-05-21 LAB — CBC WITH DIFFERENTIAL (CANCER CENTER ONLY)
Abs Immature Granulocytes: 0 10*3/uL (ref 0.00–0.07)
BASOS ABS: 0 10*3/uL (ref 0.0–0.1)
BASOS PCT: 1 %
EOS ABS: 0.2 10*3/uL (ref 0.0–0.5)
EOS PCT: 3 %
HCT: 38.5 % — ABNORMAL LOW (ref 39.0–52.0)
Hemoglobin: 12.7 g/dL — ABNORMAL LOW (ref 13.0–17.0)
Immature Granulocytes: 0 %
Lymphocytes Relative: 37 %
Lymphs Abs: 2.4 10*3/uL (ref 0.7–4.0)
MCH: 29.2 pg (ref 26.0–34.0)
MCHC: 33 g/dL (ref 30.0–36.0)
MCV: 88.5 fL (ref 80.0–100.0)
MONO ABS: 0.6 10*3/uL (ref 0.1–1.0)
Monocytes Relative: 10 %
Neutro Abs: 3.2 10*3/uL (ref 1.7–7.7)
Neutrophils Relative %: 49 %
PLATELETS: 180 10*3/uL (ref 150–400)
RBC: 4.35 MIL/uL (ref 4.22–5.81)
RDW: 13.2 % (ref 11.5–15.5)
WBC: 6.3 10*3/uL (ref 4.0–10.5)
nRBC: 0 % (ref 0.0–0.2)

## 2018-05-21 LAB — CMP (CANCER CENTER ONLY)
ALT: 8 U/L (ref 0–44)
AST: 13 U/L — AB (ref 15–41)
Albumin: 4 g/dL (ref 3.5–5.0)
Alkaline Phosphatase: 77 U/L (ref 38–126)
Anion gap: 8 (ref 5–15)
BUN: 11 mg/dL (ref 8–23)
CO2: 28 mmol/L (ref 22–32)
CREATININE: 1.06 mg/dL (ref 0.61–1.24)
Calcium: 9.6 mg/dL (ref 8.9–10.3)
Chloride: 106 mmol/L (ref 98–111)
Glucose, Bld: 121 mg/dL — ABNORMAL HIGH (ref 70–99)
Potassium: 4.5 mmol/L (ref 3.5–5.1)
Sodium: 142 mmol/L (ref 135–145)
TOTAL PROTEIN: 7.1 g/dL (ref 6.5–8.1)
Total Bilirubin: 0.8 mg/dL (ref 0.3–1.2)

## 2018-05-21 MED ORDER — TEMOZOLOMIDE 140 MG PO CAPS: 280.0000 mg | ORAL_CAPSULE | Freq: Every day | ORAL | 0 refills | Status: DC

## 2018-05-21 MED ORDER — ONDANSETRON HCL 8 MG PO TABS: 8.0000 mg | ORAL_TABLET | Freq: Two times a day (BID) | ORAL | 1 refills | Status: DC | PRN

## 2018-05-21 MED ORDER — TEMOZOLOMIDE 20 MG PO CAPS: 40.0000 mg | ORAL_CAPSULE | Freq: Every day | ORAL | 0 refills | Status: DC

## 2018-05-21 NOTE — Progress Notes (Signed)
 [facility] [address]  [place], [place] [address] [phone]   Interval Evaluation  Date of Service: 05/21/18 Patient Name: Christopher Morris Patient MRN: [id number] Patient DOB: 11/06/1940 Provider:  K , MD  Identifying Statement:  Christopher Morris is a 77 y.o. male with left frontal glioblastoma    Oncologic History:   Frontal glioblastoma multiforme (HCC)   09/30/2017 Surgery    Craniotomy, resection by Dr. Nudelman.  Path demonstrates glioblastoma    10/28/2017 -  Radiation Therapy    IMRT with concurrent Temodar    01/07/2018 -  Chemotherapy    The patient had [No matching medication found in this treatment plan]  for chemotherapy treatment.      Biomarkers:  MGMT Methylated.  IDH 1/2 Unknown.  EGFR Amplified  EGFRvIII positive   Interval History:  Christopher Morris presents today for follow up after completing fourth cycle of adjuvant Temodar.  Continues to walk with his walker, but also ambulates independently at home. Otherwise denies seizures, headaches, new or progressive neurologic deficits.    Medications: Current Outpatient Medications on File Prior to Visit  Medication Sig Dispense Refill  . apixaban (ELIQUIS) 5 MG TABS tablet Take 10 mg BID till 12/24/2017, start taking 5 mg BID from 12/25/2017 60 tablet 0  . levETIRAcetam (KEPPRA) 1000 MG tablet Take 1 tablet (1,000 mg total) by mouth 2 (two) times daily. 30 tablet 1  . temozolomide (TEMODAR) 140 MG capsule Take 2 capsules (280 mg total) by mouth daily. May take on an empty stomach to decrease nausea & vomiting. 10 capsule 0  . temozolomide (TEMODAR) 20 MG capsule Take 2 capsules (40 mg total) by mouth daily. May take on an empty stomach to decrease nausea & vomiting. 10 capsule 0   No current facility-administered medications on file prior to visit.     Allergies:  Allergies  Allergen Reactions  . Pork-Derived Products     Patient does not eat pork   . Shellfish Allergy     Patient does not eat shellfish   Past Medical History:  Past Medical History:  Diagnosis Date  . Brain cancer (HCC)   . Glioblastoma (HCC) 09/25/2017  . Hyperglycemia 09/25/2017  . Seizure (HCC) 09/25/2017   Past Surgical History:  Past Surgical History:  Procedure Laterality Date  . APPLICATION OF CRANIAL NAVIGATION N/A 09/30/2017   Procedure: APPLICATION OF CRANIAL NAVIGATION;  Surgeon: Nudelman, Robert, MD;  Location: [facility] OR;  Service: Neurosurgery;  Laterality: N/A;  . CRANIOTOMY N/A 09/30/2017   Procedure: CRANIOTOMY FOR RESECTION OF BRAIN TUMOR WITH STEREOTACTIC;  Surgeon: Nudelman, Robert, MD;  Location: [facility] OR;  Service: Neurosurgery;  Laterality: N/A;  CRANIOTOMY FOR RESECTION OF BRAIN TUMOR WITH STEREOTACTIC   Social History:  Social History   Socioeconomic History  . Marital status: Divorced    Spouse name: Not on file  . Number of children: Not on file  . Years of education: Not on file  . Highest education level: Not on file  Occupational History  . Not on file  Social Needs  . Financial resource strain: Not on file  . Food insecurity:    Worry: Not on file    Inability: Not on file  . Transportation needs:    Medical: No    Non-medical: No  Tobacco Use  . Smoking status: Never Smoker  . Smokeless tobacco: Never Used  Substance and Sexual Activity  . Alcohol use: No    Frequency: Never  .   Drug use: No  . Sexual activity: Not Currently  Lifestyle  . Physical activity:    Days per week: 5 days    Minutes per session: Not on file  . Stress: Only a little  Relationships  . Social connections:    Talks on phone: Not on file    Gets together: Not on file    Attends religious service: Not on file    Active member of club or organization: Not on file    Attends meetings of clubs or organizations: Not on file    Relationship status: Not on file  . Intimate partner violence:    Fear of current or ex partner: Not on file    Emotionally  abused: Not on file    Physically abused: Not on file    Forced sexual activity: Not on file  Other Topics Concern  . Not on file  Social History Narrative  . Not on file   Family History:  Family History  Problem Relation Age of Onset  . Hypertension Daughter   . Stroke Mother   . Cancer Neg Hx     Review of Systems: Constitutional: Denies fevers, chills or abnormal weight loss Eyes: Denies blurriness of vision Ears, nose, mouth, throat, and face: Denies mucositis or sore throat Respiratory: Denies cough, dyspnea or wheezes Cardiovascular: Denies palpitation, chest discomfort or lower extremity swelling Gastrointestinal:  Denies nausea, constipation, diarrhea GU: Denies dysuria or incontinence Skin: Denies abnormal skin rashes Neurological: Per HPI Musculoskeletal: Denies joint pain, back or neck discomfort. No decrease in ROM Behavioral/Psych: Denies anxiety, disturbance in thought content, and mood instability  Physical Exam: Vitals:   05/21/18 1127  BP: 131/61  Pulse: 67  Resp: 18  Temp: 97.7 F (36.5 C)  SpO2: 100%   KPS: 80. General: Alert, cooperative, pleasant, in no acute distress Head: Craniotomy scar noted, dry and intact. EENT: No conjunctival injection or scleral icterus. Oral mucosa moist Lungs: Resp effort normal Cardiac: Regular rate and rhythm Abdomen: Soft, non-distended abdomen Skin: No rashes cyanosis or petechiae. Extremities: No clubbing or edema  Neurologic Exam: Mental Status: Awake, alert, attentive to examiner. Oriented to self and environment. Language has mild impairment in fluency with intact comprehension and repetition.  Cranial Nerves: Visual acuity is grossly normal. Visual fields are full. Extra-ocular movements intact. No ptosis. Face is symmetric, tongue midline. Motor: Tone and bulk are normal. Pronator drift right arm, left side 5/5 power. Reflexes are symmetric, no pathologic reflexes present. Intact finger to nose  bilaterally Sensory: Intact to light touch and temperature Gait: Independent but purposeful and wide based   Labs: I have reviewed the data as listed    Component Value Date/Time   NA 143 04/05/2018 1246   NA 136 (A) 10/18/2017   K 4.0 04/05/2018 1246   CL 107 04/05/2018 1246   CO2 25 04/05/2018 1246   GLUCOSE 127 (H) 04/05/2018 1246   BUN 13 04/05/2018 1246   BUN 19 10/18/2017   CREATININE 0.88 04/05/2018 1246   CREATININE 1.02 04/02/2018 1049   CALCIUM 9.2 04/05/2018 1246   PROT 6.7 04/05/2018 1246   ALBUMIN 3.9 04/05/2018 1246   AST 19 04/05/2018 1246   AST 12 (L) 04/02/2018 1049   ALT 12 04/05/2018 1246   ALT 9 04/02/2018 1049   ALKPHOS 69 04/05/2018 1246   BILITOT 0.6 04/05/2018 1246   BILITOT 0.8 04/02/2018 1049   GFRNONAA >60 04/05/2018 1246   GFRNONAA >60 04/02/2018 1049   GFRAA >60 04/05/2018   1246   GFRAA >60 04/02/2018 1049   Lab Results  Component Value Date   WBC 6.3 05/21/2018   NEUTROABS 3.2 05/21/2018   HGB 12.7 (L) 05/21/2018   HCT 38.5 (L) 05/21/2018   MCV 88.5 05/21/2018   PLT 180 05/21/2018   Imaging:  [facility] Clinician Interpretation: I have personally reviewed the CNS images as listed.  My interpretation, in the context of the patient's clinical presentation, is stable disease  Mr Brain W Wo Contrast  Result Date: 05/16/2018 CLINICAL DATA:  Glioblastoma follow-up, status post resection 09/30/2017. EXAM: MRI HEAD WITHOUT AND WITH CONTRAST TECHNIQUE: Multiplanar, multiecho pulse sequences of the brain and surrounding structures were obtained without and with intravenous contrast. CONTRAST:  9 mL Gadavist COMPARISON:  Brain MRI 03/27/2018, 12/27/2017 FINDINGS: BRAIN: There is confluent white matter hyperintensity in a predominantly periventricular distribution. There is a left frontal resection site again demonstrated with surrounding hyperintense T2-weighted signal that extends to the overlying cortex, but is unchanged in extent. There is no midline  shift or other mass effect. There is a small amount of blood products at the resection site but no other abnormality on blood sensitive sequences. There is contrast enhancement at the resection site that measures 2.2 x 3.2 x 1.9 cm (greatest orthogonal dimensions, transverse by AP by cc). Compared to 03/27/2018, the size of the mass is unchanged; but, as previously noted, it has increased since 12/27/2017. There is no acute infarct or acute hemorrhage. No midline shift or other mass effect. VASCULAR: Major intracranial arterial and venous sinus flow voids are normal. SKULL AND UPPER CERVICAL SPINE: Status post left-sided craniotomy. SINUSES/ORBITS: No fluid levels or advanced mucosal thickening. No mastoid or middle ear effusion. The orbits are normal. IMPRESSION: 1. Unchanged size of contrast enhancing component of left frontal lobe tumor surrounding hyperintense T2-weighted signal, compared to 03/27/2018 brain MRI. As previously stated, the size has increased since 12/27/2017. 2. No acute abnormality. 3. No new contrast-enhancing lesions. Electronically Signed   By: Kevin  Herman M.D.   On: 05/16/2018 14:24    Assessment/Plan 1. Glioblastoma (HCC)  2. Seizure (HCC)  Mr. Willet is clinically and radiographically stable today.      We recommended continuing treatment with cycle #5 Temozolomide 150 mg/m2, on for five days and off for twenty three days in twenty eight day cycles. The patient will have a complete blood count performed on days 21 and 28 of each cycle, and a comprehensive metabolic panel performed on day 28 of each cycle. Labs may need to be performed more often. Zofran will prescribed for home use for nausea/vomiting.   Chemotherapy should be held for the following:  ANC less than 1,000  Platelets less than 100,000  LFT or creatinine greater than 2x ULN  If clinical concerns/contraindications develop  He should continue Keppra and Eliquis at established doses.  We recommend he  return in 4 weeks with labs for evaluation.   All questions were answered. The patient knows to call the clinic with any problems, questions or concerns. No barriers to learning were detected.  The total time spent in the encounter was 25 minutes and more than 50% was on counseling and review of test results    K , MD Medical Director of Neuro-Oncology [facility] [place] 05/21/18 11:39 AM 

## 2018-05-27 ENCOUNTER — Telehealth: Payer: Self-pay

## 2018-05-27 ENCOUNTER — Other Ambulatory Visit: Payer: Self-pay | Admitting: Internal Medicine

## 2018-05-27 DIAGNOSIS — C711 Malignant neoplasm of frontal lobe: Secondary | ICD-10-CM

## 2018-05-27 MED ORDER — TEMOZOLOMIDE 140 MG PO CAPS: 280.0000 mg | ORAL_CAPSULE | Freq: Every day | ORAL | 0 refills | Status: DC

## 2018-05-27 MED ORDER — TEMOZOLOMIDE 20 MG PO CAPS: 40.0000 mg | ORAL_CAPSULE | Freq: Every day | ORAL | 0 refills | Status: DC

## 2018-05-27 NOTE — Telephone Encounter (Signed)
Oral Oncology Patient Advocate Encounter  I received a notification from [facility] today that after 07/15/18 they will no longer be accepting Temodar manufacturer applications. Mr. Gaede currently does not have Part B or D insurance. I emailed the call center and got the 340b cash price, 140mg  #10 tablets is $114.60 an20mg  #10 tablets is $16.55. Mr. Bunyard could apply for the [place] if needed. I called the [employer] patient assistance program to get more information and make sure the patient was getting his refill. [employer] said that they will still fill the medicine as long as he has refills, they have the medicine in stock and before his expiration date of 03/14/19. They will not be accepting new applications after 75/17/00. They also need a new prescription, which I sent a staff message to Maudie Mercury, RN and called her, she is working on it. Fax # [phone] [facility] [employer].   I called the patients daughter and had to leave a message for her to return my call.   [place] Patient [place] Phone [phone] Fax [phone]

## 2018-06-02 ENCOUNTER — Other Ambulatory Visit: Payer: Self-pay | Admitting: *Deleted

## 2018-06-02 DIAGNOSIS — C711 Malignant neoplasm of frontal lobe: Secondary | ICD-10-CM

## 2018-06-02 MED ORDER — ONDANSETRON HCL 8 MG PO TABS: 8.0000 mg | ORAL_TABLET | Freq: Two times a day (BID) | ORAL | 1 refills | Status: DC | PRN

## 2018-06-02 MED FILL — ONDANSETRON HCL 8 MG TABLET: 8 | 15 days supply | Qty: 30 | Fill #0

## 2018-06-18 ENCOUNTER — Telehealth: Payer: Self-pay

## 2018-06-18 ENCOUNTER — Inpatient Hospital Stay: Payer: Non-veteran care

## 2018-06-18 ENCOUNTER — Inpatient Hospital Stay: Payer: Non-veteran care | Attending: Internal Medicine | Admitting: Internal Medicine

## 2018-06-18 VITALS — BP 125/56 | HR 71 | Temp 97.8°F | Resp 17 | Ht 76.0 in | Wt 204.0 lb

## 2018-06-18 DIAGNOSIS — Z7901 Long term (current) use of anticoagulants: Secondary | ICD-10-CM | POA: Insufficient documentation

## 2018-06-18 DIAGNOSIS — C719 Malignant neoplasm of brain, unspecified: Secondary | ICD-10-CM

## 2018-06-18 DIAGNOSIS — C711 Malignant neoplasm of frontal lobe: Secondary | ICD-10-CM | POA: Diagnosis not present

## 2018-06-18 DIAGNOSIS — R569 Unspecified convulsions: Secondary | ICD-10-CM | POA: Insufficient documentation

## 2018-06-18 LAB — CBC WITH DIFFERENTIAL (CANCER CENTER ONLY)
Abs Immature Granulocytes: 0.01 10*3/uL (ref 0.00–0.07)
BASOS PCT: 1 %
Basophils Absolute: 0 10*3/uL (ref 0.0–0.1)
EOS ABS: 0.2 10*3/uL (ref 0.0–0.5)
EOS PCT: 3 %
HEMATOCRIT: 37.4 % — AB (ref 39.0–52.0)
Hemoglobin: 12.4 g/dL — ABNORMAL LOW (ref 13.0–17.0)
Immature Granulocytes: 0 %
LYMPHS ABS: 2.2 10*3/uL (ref 0.7–4.0)
Lymphocytes Relative: 34 %
MCH: 29.6 pg (ref 26.0–34.0)
MCHC: 33.2 g/dL (ref 30.0–36.0)
MCV: 89.3 fL (ref 80.0–100.0)
MONOS PCT: 8 %
Monocytes Absolute: 0.5 10*3/uL (ref 0.1–1.0)
NRBC: 0 % (ref 0.0–0.2)
Neutro Abs: 3.5 10*3/uL (ref 1.7–7.7)
Neutrophils Relative %: 54 %
Platelet Count: 196 10*3/uL (ref 150–400)
RBC: 4.19 MIL/uL — ABNORMAL LOW (ref 4.22–5.81)
RDW: 13.3 % (ref 11.5–15.5)
WBC Count: 6.4 10*3/uL (ref 4.0–10.5)

## 2018-06-18 LAB — CMP (CANCER CENTER ONLY)
ALK PHOS: 79 U/L (ref 38–126)
ALT: 8 U/L (ref 0–44)
AST: 11 U/L — ABNORMAL LOW (ref 15–41)
Albumin: 4 g/dL (ref 3.5–5.0)
Anion gap: 8 (ref 5–15)
BILIRUBIN TOTAL: 0.7 mg/dL (ref 0.3–1.2)
BUN: 11 mg/dL (ref 8–23)
CALCIUM: 9.2 mg/dL (ref 8.9–10.3)
CO2: 26 mmol/L (ref 22–32)
CREATININE: 1.08 mg/dL (ref 0.61–1.24)
Chloride: 107 mmol/L (ref 98–111)
GFR, Estimated: >60 mL/min (ref >60)
GLUCOSE: 118 mg/dL — AB (ref 70–99)
Potassium: 3.9 mmol/L (ref 3.5–5.1)
Sodium: 141 mmol/L (ref 135–145)
TOTAL PROTEIN: 6.8 g/dL (ref 6.5–8.1)

## 2018-06-18 MED ORDER — TEMOZOLOMIDE 140 MG PO CAPS: 280.0000 mg | ORAL_CAPSULE | Freq: Every day | ORAL | 0 refills | Status: DC

## 2018-06-18 MED ORDER — TEMOZOLOMIDE 20 MG PO CAPS: 40.0000 mg | ORAL_CAPSULE | Freq: Every day | ORAL | 0 refills | Status: DC

## 2018-06-18 NOTE — Telephone Encounter (Signed)
Printed avs and calender of upcoming appointment. Per 12/4 los 

## 2018-06-18 NOTE — Progress Notes (Signed)
[facility] [place], [place] [address] [phone]   Interval Evaluation  Date of Service: 06/18/18 Patient Name: Christopher Morris Patient MRN: [id number] Patient DOB: 1941/05/04 Provider: Ventura Sellers, MD  Identifying Statement:  Christopher Morris is a 77 y.o. male with left frontal glioblastoma    Oncologic History:   Frontal glioblastoma multiforme ([place])   09/30/2017 Surgery    Craniotomy, resection by Dr. Sherwood Gambler.  Path demonstrates glioblastoma    10/28/2017 -  Radiation Therapy    IMRT with concurrent Temodar    01/07/2018 -  Chemotherapy    The patient had [No matching medication found in this treatment plan]  for chemotherapy treatment.      Biomarkers:  MGMT Methylated.  IDH 1/2 Unknown.  EGFR Amplified  EGFRvIII positive   Interval History:  Christopher Morris presents today for follow up after completing fifth cycle of adjuvant Temodar.  Continues to walk with his walker, but also ambulates independently at home. Otherwise denies seizures, headaches, new or progressive neurologic deficits.    Medications: Current Outpatient Medications on File Prior to Visit  Medication Sig Dispense Refill  . apixaban (ELIQUIS) 5 MG TABS tablet Take 10 mg BID till 12/24/2017, start taking 5 mg BID from 12/25/2017 60 tablet 0  . levETIRAcetam (KEPPRA) 1000 MG tablet Take 1 tablet (1,000 mg total) by mouth 2 (two) times daily. 30 tablet 1  . ondansetron (ZOFRAN) 8 MG tablet Take 1 tablet (8 mg total) by mouth 2 (two) times daily as needed. Start on the third day after chemotherapy. 30 tablet 1  . temozolomide (TEMODAR) 140 MG capsule Take 2 capsules (280 mg total) by mouth daily. May take on an empty stomach to decrease nausea & vomiting. 10 capsule 0  . temozolomide (TEMODAR) 140 MG capsule Take 2 capsules (280 mg total) by mouth daily. May take on an empty stomach to decrease nausea & vomiting. 10 capsule 0  . temozolomide  (TEMODAR) 140 MG capsule Take 2 capsules (280 mg total) by mouth daily. May take on an empty stomach to decrease nausea & vomiting. 10 capsule 0  . temozolomide (TEMODAR) 20 MG capsule Take 2 capsules (40 mg total) by mouth daily. May take on an empty stomach to decrease nausea & vomiting. 10 capsule 0  . temozolomide (TEMODAR) 20 MG capsule Take 2 capsules (40 mg total) by mouth daily. May take on an empty stomach to decrease nausea & vomiting. 10 capsule 0  . temozolomide (TEMODAR) 20 MG capsule Take 2 capsules (40 mg total) by mouth daily. May take on an empty stomach to decrease nausea & vomiting. 10 capsule 0   No current facility-administered medications on file prior to visit.     Allergies:  Allergies  Allergen Reactions  . Pork-Derived Products     Patient does not eat pork  . Shellfish Allergy     Patient does not eat shellfish   Past Medical History:  Past Medical History:  Diagnosis Date  . Brain cancer ([place])   . Glioblastoma ([place]) 09/25/2017  . Hyperglycemia 09/25/2017  . Seizure ([place]) 09/25/2017   Past Surgical History:  Past Surgical History:  Procedure Laterality Date  . APPLICATION OF CRANIAL NAVIGATION N/A 09/30/2017   Procedure: APPLICATION OF CRANIAL NAVIGATION;  Surgeon: [name], MD;  Location: [facility];  Service: Neurosurgery;  Laterality: N/A;  . CRANIOTOMY N/A 09/30/2017   Procedure: CRANIOTOMY FOR RESECTION OF BRAIN TUMOR WITH STEREOTACTIC;  Surgeon: Sherwood Gambler,  [name], MD;  Location: [facility];  Service: Neurosurgery;  Laterality: N/A;  CRANIOTOMY FOR RESECTION OF BRAIN TUMOR WITH STEREOTACTIC   Social History:  Social History   Socioeconomic History  . Marital status: Divorced    Spouse name: Not on file  . Number of children: Not on file  . Years of education: Not on file  . Highest education level: Not on file  Occupational History  . Not on file  Social Needs  . Financial resource strain: Not on file  . Food insecurity:    Worry: Not on file     Inability: Not on file  . Transportation needs:    Medical: No    Non-medical: No  Tobacco Use  . Smoking status: Never Smoker  . Smokeless tobacco: Never Used  Substance and Sexual Activity  . Alcohol use: No    Frequency: Never  . Drug use: No  . Sexual activity: Not Currently  Lifestyle  . Physical activity:    Days per week: 5 days    Minutes per session: Not on file  . Stress: Only a little  Relationships  . Social connections:    Talks on phone: Not on file    Gets together: Not on file    Attends religious service: Not on file    Active member of club or organization: Not on file    Attends meetings of clubs or organizations: Not on file    Relationship status: Not on file  . Intimate partner violence:    Fear of current or ex partner: Not on file    Emotionally abused: Not on file    Physically abused: Not on file    Forced sexual activity: Not on file  Other Topics Concern  . Not on file  Social History Narrative  . Not on file   Family History:  Family History  Problem Relation Age of Onset  . Hypertension Daughter   . Stroke Mother   . Cancer Neg Hx     Review of Systems: Constitutional: Denies fevers, chills or abnormal weight loss Eyes: Denies blurriness of vision Ears, nose, mouth, throat, and face: Denies mucositis or sore throat Respiratory: Denies cough, dyspnea or wheezes Cardiovascular: Denies palpitation, chest discomfort or lower extremity swelling Gastrointestinal:  Denies nausea, constipation, diarrhea GU: Denies dysuria or incontinence Skin: Denies abnormal skin rashes Neurological: Per HPI Musculoskeletal: Denies joint pain, back or neck discomfort. No decrease in ROM Behavioral/Psych: Denies anxiety, disturbance in thought content, and mood instability  Physical Exam: Vitals:   06/18/18 1205  BP: (!) 125/56  Pulse: 71  Resp: 17  Temp: 97.8 F (36.6 C)  SpO2: 99%   KPS: 80. General: Alert, cooperative, pleasant, in no acute  distress Head: Craniotomy scar noted, dry and intact. EENT: No conjunctival injection or scleral icterus. Oral mucosa moist Lungs: Resp effort normal Cardiac: Regular rate and rhythm Abdomen: Soft, non-distended abdomen Skin: No rashes cyanosis or petechiae. Extremities: No clubbing or edema  Neurologic Exam: Mental Status: Awake, alert, attentive to examiner. Oriented to self and environment. Language has mild impairment in fluency with intact comprehension and repetition.  Cranial Nerves: Visual acuity is grossly normal. Visual fields are full. Extra-ocular movements intact. No ptosis. Face is symmetric, tongue midline. Motor: Tone and bulk are normal. Pronator drift right arm, left side 5/5 power. Reflexes are symmetric, no pathologic reflexes present. Intact finger to nose bilaterally Sensory: Intact to light touch and temperature Gait: Independent but purposeful and wide based  Labs: I have reviewed the data as listed    Component Value Date/Time   NA 142 05/21/2018 1111   NA 136 (A) 10/18/2017   K 4.5 05/21/2018 1111   CL 106 05/21/2018 1111   CO2 28 05/21/2018 1111   GLUCOSE 121 (H) 05/21/2018 1111   BUN 11 05/21/2018 1111   BUN 19 10/18/2017   CREATININE 1.06 05/21/2018 1111   CALCIUM 9.6 05/21/2018 1111   PROT 7.1 05/21/2018 1111   ALBUMIN 4.0 05/21/2018 1111   AST 13 (L) 05/21/2018 1111   ALT 8 05/21/2018 1111   ALKPHOS 77 05/21/2018 1111   BILITOT 0.8 05/21/2018 1111   GFRNONAA >60 05/21/2018 1111   GFRAA >60 05/21/2018 1111   Lab Results  Component Value Date   WBC 6.4 06/18/2018   NEUTROABS 3.5 06/18/2018   HGB 12.4 (L) 06/18/2018   HCT 37.4 (L) 06/18/2018   MCV 89.3 06/18/2018   PLT 196 06/18/2018    Assessment/Plan 1. Glioblastoma ([place])  2. Seizure [facility])  Mr. Rodriquez is clinically stable today.  Labs are WNL.    We recommended continuing treatment with cycle #6 Temozolomide 150 mg/m2, on for five days and off for twenty three days in twenty eight  day cycles. The patient will have a complete blood count performed on days 21 and 28 of each cycle, and a comprehensive metabolic panel performed on day 28 of each cycle. Labs may need to be performed more often. Zofran will prescribed for home use for nausea/vomiting.   Chemotherapy should be held for the following:  ANC less than 1,000  Platelets less than 100,000  LFT or creatinine greater than 2x ULN  If clinical concerns/contraindications develop  He should continue Keppra and Eliquis at established doses.  We recommend he return in 5 weeks with an MRI brain for evaluation.  All questions were answered. The patient knows to call the clinic with any problems, questions or concerns. No barriers to learning were detected.  The total time spent in the encounter was 25 minutes and more than 50% was on counseling and review of test results   Ventura Sellers, MD Medical Director of Neuro-Oncology [facility] [place] 06/18/18 11:56 AM

## 2018-07-21 NOTE — Progress Notes (Signed)
FMLA successfully faxed to Jerene Dilling at [phone]. Mailed copy to patient address on file.

## 2018-07-24 ENCOUNTER — Other Ambulatory Visit: Payer: Self-pay | Admitting: Radiation Therapy

## 2018-07-24 ENCOUNTER — Telehealth: Payer: Self-pay | Admitting: Internal Medicine

## 2018-07-24 NOTE — Telephone Encounter (Signed)
ZV PAL - moved 1/15 f/u to 1/6. Left message. Schedule mailed.

## 2018-07-25 ENCOUNTER — Ambulatory Visit (HOSPITAL_COMMUNITY)
Admission: RE | Admit: 2018-07-25 | Discharge: 2018-07-25 | Disposition: A | Discharge status: Home / Self Care | Payer: Medicare Other | Source: Ambulatory Visit | Attending: Internal Medicine | Admitting: Internal Medicine

## 2018-07-25 DIAGNOSIS — C711 Malignant neoplasm of frontal lobe: Secondary | ICD-10-CM | POA: Insufficient documentation

## 2018-07-25 MED ORDER — GADOBUTROL 1 MMOL/ML IV SOLN
10.0000 mL | Freq: Once | INTRAVENOUS | Status: AC | PRN
  Administered 2018-07-25: 10 mL via INTRAVENOUS

## 2018-07-28 ENCOUNTER — Other Ambulatory Visit: Payer: Non-veteran care

## 2018-07-29 NOTE — Progress Notes (Signed)
Brain and Spine Tumor Board Documentation  [name] was presented by Cecil Cobbs, MD at Brain and Spine Tumor Board on 07/29/2018, which included representatives from neuro oncology, radiation oncology, surgical oncology, radiology, pathology, navigation.  Prestyn was presented as a current patient with history of the following treatments:  .  Additionally, we reviewed previous medical and familial history, history of present illness, and recent lab results along with all available histopathologic and imaging studies. The tumor board considered available treatment options and made the following recommendations:  Chemotherapy 5-day TMZ  Tumor board is a meeting of clinicians from various specialty areas who evaluate and discuss patients for whom a multidisciplinary approach is being considered. Final determinations in the plan of care are those of the provider(s). The responsibility for follow up of recommendations given during tumor board is that of the provider.   Today's extended care, comprehensive team conference, Antonis was not present for the discussion and was not examined.

## 2018-07-30 ENCOUNTER — Other Ambulatory Visit: Payer: Non-veteran care

## 2018-07-30 ENCOUNTER — Ambulatory Visit: Payer: Non-veteran care | Admitting: Internal Medicine

## 2018-07-31 ENCOUNTER — Inpatient Hospital Stay (HOSPITAL_BASED_OUTPATIENT_CLINIC_OR_DEPARTMENT_OTHER): Payer: Medicare Other | Admitting: Internal Medicine

## 2018-07-31 ENCOUNTER — Inpatient Hospital Stay: Payer: Medicare Other | Attending: Internal Medicine

## 2018-07-31 ENCOUNTER — Encounter: Payer: Self-pay | Admitting: Internal Medicine

## 2018-07-31 ENCOUNTER — Telehealth: Payer: Self-pay | Admitting: Internal Medicine

## 2018-07-31 VITALS — BP 121/71 | HR 72 | Temp 97.8°F | Resp 17 | Ht 76.0 in | Wt 202.7 lb

## 2018-07-31 DIAGNOSIS — C719 Malignant neoplasm of brain, unspecified: Secondary | ICD-10-CM

## 2018-07-31 DIAGNOSIS — R569 Unspecified convulsions: Secondary | ICD-10-CM | POA: Diagnosis not present

## 2018-07-31 DIAGNOSIS — C711 Malignant neoplasm of frontal lobe: Secondary | ICD-10-CM

## 2018-07-31 LAB — CBC WITH DIFFERENTIAL (CANCER CENTER ONLY)
Abs Immature Granulocytes: 0.01 10*3/uL (ref 0.00–0.07)
Basophils Absolute: 0 10*3/uL (ref 0.0–0.1)
Basophils Relative: 0 %
EOS PCT: 2 %
Eosinophils Absolute: 0.1 10*3/uL (ref 0.0–0.5)
HCT: 38.6 % — ABNORMAL LOW (ref 39.0–52.0)
HEMOGLOBIN: 12.9 g/dL — AB (ref 13.0–17.0)
Immature Granulocytes: 0 %
LYMPHS PCT: 37 %
Lymphs Abs: 2.2 10*3/uL (ref 0.7–4.0)
MCH: 29.6 pg (ref 26.0–34.0)
MCHC: 33.4 g/dL (ref 30.0–36.0)
MCV: 88.5 fL (ref 80.0–100.0)
MONO ABS: 0.5 10*3/uL (ref 0.1–1.0)
Monocytes Relative: 8 %
Neutro Abs: 3.1 10*3/uL (ref 1.7–7.7)
Neutrophils Relative %: 53 %
Platelet Count: 153 10*3/uL (ref 150–400)
RBC: 4.36 MIL/uL (ref 4.22–5.81)
RDW: 12.5 % (ref 11.5–15.5)
WBC Count: 5.9 10*3/uL (ref 4.0–10.5)
nRBC: 0 % (ref 0.0–0.2)

## 2018-07-31 LAB — CMP (CANCER CENTER ONLY)
ALT: 9 U/L (ref 0–44)
AST: 11 U/L — AB (ref 15–41)
Albumin: 4.1 g/dL (ref 3.5–5.0)
Alkaline Phosphatase: 77 U/L (ref 38–126)
Anion gap: 7 (ref 5–15)
BUN: 9 mg/dL (ref 8–23)
CO2: 27 mmol/L (ref 22–32)
Calcium: 9.2 mg/dL (ref 8.9–10.3)
Chloride: 107 mmol/L (ref 98–111)
Creatinine: 0.98 mg/dL (ref 0.61–1.24)
GFR, Est AFR Am: >60 mL/min (ref >60)
GFR, Estimated: >60 mL/min (ref >60)
Glucose, Bld: 129 mg/dL — ABNORMAL HIGH (ref 70–99)
Potassium: 4.4 mmol/L (ref 3.5–5.1)
Sodium: 141 mmol/L (ref 135–145)
Total Bilirubin: 0.9 mg/dL (ref 0.3–1.2)
Total Protein: 7 g/dL (ref 6.5–8.1)

## 2018-07-31 MED ORDER — TEMOZOLOMIDE 20 MG PO CAPS: 40.0000 mg | ORAL_CAPSULE | Freq: Every day | ORAL | 0 refills | Status: DC

## 2018-07-31 MED ORDER — TEMOZOLOMIDE 140 MG PO CAPS: 280.0000 mg | ORAL_CAPSULE | Freq: Every day | ORAL | 0 refills | Status: DC

## 2018-07-31 NOTE — Progress Notes (Signed)
[facility] [place], [place] [address] [phone]   Interval Evaluation  Date of Service: 07/31/18 Patient Name: Christopher Morris Patient MRN: [id number] Patient DOB: Dec 12, 1940 Provider: Ventura Sellers, MD  Identifying Statement:  Christopher Morris is a 78 y.o. male with left frontal glioblastoma    Oncologic History:   Frontal glioblastoma multiforme ([place])   09/30/2017 Surgery    Craniotomy, resection by Dr. Sherwood Gambler.  Path demonstrates glioblastoma    10/28/2017 -  Radiation Therapy    IMRT with concurrent Temodar    01/07/2018 -  Chemotherapy    The patient had [No matching medication found in this treatment plan]  for chemotherapy treatment.      Biomarkers:  MGMT Methylated.  IDH 1/2 Unknown.  EGFR Amplified  EGFRvIII positive   Interval History:  Christopher Morris presents today for follow up after completing sixth cycle of adjuvant Temodar.  He doesn't describe significant fatigue during treatment week.  Continues to walk with his walker, but also ambulates independently at home. Otherwise denies seizures, headaches, new or progressive neurologic deficits.    Medications: Current Outpatient Medications on File Prior to Visit  Medication Sig Dispense Refill  . apixaban (ELIQUIS) 5 MG TABS tablet Take 10 mg BID till 12/24/2017, start taking 5 mg BID from 12/25/2017 60 tablet 0  . levETIRAcetam (KEPPRA) 1000 MG tablet Take 1 tablet (1,000 mg total) by mouth 2 (two) times daily. 30 tablet 1  . ondansetron (ZOFRAN) 8 MG tablet Take 1 tablet (8 mg total) by mouth 2 (two) times daily as needed. Start on the third day after chemotherapy. 30 tablet 1  . temozolomide (TEMODAR) 140 MG capsule Take 2 capsules (280 mg total) by mouth daily. May take on an empty stomach to decrease nausea & vomiting. 10 capsule 0  . temozolomide (TEMODAR) 140 MG capsule Take 2 capsules (280 mg total) by mouth daily. May take on an empty stomach  to decrease nausea & vomiting. 10 capsule 0  . temozolomide (TEMODAR) 140 MG capsule Take 2 capsules (280 mg total) by mouth daily. May take on an empty stomach to decrease nausea & vomiting. 10 capsule 0  . temozolomide (TEMODAR) 140 MG capsule Take 2 capsules (280 mg total) by mouth daily. May take on an empty stomach to decrease nausea & vomiting. 10 capsule 0  . temozolomide (TEMODAR) 20 MG capsule Take 2 capsules (40 mg total) by mouth daily. May take on an empty stomach to decrease nausea & vomiting. 10 capsule 0  . temozolomide (TEMODAR) 20 MG capsule Take 2 capsules (40 mg total) by mouth daily. May take on an empty stomach to decrease nausea & vomiting. 10 capsule 0  . temozolomide (TEMODAR) 20 MG capsule Take 2 capsules (40 mg total) by mouth daily. May take on an empty stomach to decrease nausea & vomiting. 10 capsule 0  . temozolomide (TEMODAR) 20 MG capsule Take 2 capsules (40 mg total) by mouth daily. May take on an empty stomach to decrease nausea & vomiting. 10 capsule 0   No current facility-administered medications on file prior to visit.     Allergies:  Allergies  Allergen Reactions  . Pork-Derived Products     Patient does not eat pork  . Shellfish Allergy     Patient does not eat shellfish   Past Medical History:  Past Medical History:  Diagnosis Date  . Brain cancer ([place])   . Glioblastoma ([place]) 09/25/2017  .  Hyperglycemia 09/25/2017  . Seizure ([place]) 09/25/2017   Past Surgical History:  Past Surgical History:  Procedure Laterality Date  . APPLICATION OF CRANIAL NAVIGATION N/A 09/30/2017   Procedure: APPLICATION OF CRANIAL NAVIGATION;  Surgeon: [name], MD;  Location: [facility];  Service: Neurosurgery;  Laterality: N/A;  . CRANIOTOMY N/A 09/30/2017   Procedure: CRANIOTOMY FOR RESECTION OF BRAIN TUMOR WITH STEREOTACTIC;  Surgeon: [name], MD;  Location: [facility];  Service: Neurosurgery;  Laterality: N/A;  CRANIOTOMY FOR RESECTION OF BRAIN TUMOR WITH  STEREOTACTIC   Social History:  Social History   Socioeconomic History  . Marital status: Divorced    Spouse name: Not on file  . Number of children: Not on file  . Years of education: Not on file  . Highest education level: Not on file  Occupational History  . Not on file  Social Needs  . Financial resource strain: Not on file  . Food insecurity:    Worry: Not on file    Inability: Not on file  . Transportation needs:    Medical: No    Non-medical: No  Tobacco Use  . Smoking status: Never Smoker  . Smokeless tobacco: Never Used  Substance and Sexual Activity  . Alcohol use: No    Frequency: Never  . Drug use: No  . Sexual activity: Not Currently  Lifestyle  . Physical activity:    Days per week: 5 days    Minutes per session: Not on file  . Stress: Only a little  Relationships  . Social connections:    Talks on phone: Not on file    Gets together: Not on file    Attends religious service: Not on file    Active member of club or organization: Not on file    Attends meetings of clubs or organizations: Not on file    Relationship status: Not on file  . Intimate partner violence:    Fear of current or ex partner: Not on file    Emotionally abused: Not on file    Physically abused: Not on file    Forced sexual activity: Not on file  Other Topics Concern  . Not on file  Social History Narrative  . Not on file   Family History:  Family History  Problem Relation Age of Onset  . Hypertension Daughter   . Stroke Mother   . Cancer Neg Hx     Review of Systems: Constitutional: Denies fevers, chills or abnormal weight loss Eyes: Denies blurriness of vision Ears, nose, mouth, throat, and face: Denies mucositis or sore throat Respiratory: Denies cough, dyspnea or wheezes Cardiovascular: Denies palpitation, chest discomfort or lower extremity swelling Gastrointestinal:  Denies nausea, constipation, diarrhea GU: Denies dysuria or incontinence Skin: Denies abnormal  skin rashes Neurological: Per HPI Musculoskeletal: Denies joint pain, back or neck discomfort. No decrease in ROM Behavioral/Psych: Denies anxiety, disturbance in thought content, and mood instability  Physical Exam: Vitals:   07/31/18 1253  BP: 121/71  Pulse: 72  Resp: 17  Temp: 97.8 F (36.6 C)  SpO2: 100%   KPS: 80. General: Alert, cooperative, pleasant, in no acute distress Head: Craniotomy scar noted, dry and intact. EENT: No conjunctival injection or scleral icterus. Oral mucosa moist Lungs: Resp effort normal Cardiac: Regular rate and rhythm Abdomen: Soft, non-distended abdomen Skin: No rashes cyanosis or petechiae. Extremities: No clubbing or edema  Neurologic Exam: Mental Status: Awake, alert, attentive to examiner. Oriented to self and environment. Language has mild impairment in fluency  with intact comprehension and repetition.  Cranial Nerves: Visual acuity is grossly normal. Visual fields are full. Extra-ocular movements intact. No ptosis. Face is symmetric, tongue midline. Motor: Tone and bulk are normal. Pronator drift right arm, left side 5/5 power. Reflexes are symmetric, no pathologic reflexes present. Intact finger to nose bilaterally Sensory: Intact to light touch and temperature Gait: Independent but purposeful and wide based   Labs: I have reviewed the data as listed    Component Value Date/Time   NA 141 06/18/2018 1137   NA 136 (A) 10/18/2017   K 3.9 06/18/2018 1137   CL 107 06/18/2018 1137   CO2 26 06/18/2018 1137   GLUCOSE 118 (H) 06/18/2018 1137   BUN 11 06/18/2018 1137   BUN 19 10/18/2017   CREATININE 1.08 06/18/2018 1137   CALCIUM 9.2 06/18/2018 1137   PROT 6.8 06/18/2018 1137   ALBUMIN 4.0 06/18/2018 1137   AST 11 (L) 06/18/2018 1137   ALT 8 06/18/2018 1137   ALKPHOS 79 06/18/2018 1137   BILITOT 0.7 06/18/2018 1137   GFRNONAA >60 06/18/2018 1137   GFRAA >60 06/18/2018 1137   Lab Results  Component Value Date   WBC 5.9 07/31/2018     NEUTROABS 3.1 07/31/2018   HGB 12.9 (L) 07/31/2018   HCT 38.6 (L) 07/31/2018   MCV 88.5 07/31/2018   PLT 153 07/31/2018   Imaging:  [facility] Clinician Interpretation: I have personally reviewed the CNS images as listed.  My interpretation, in the context of the patient's clinical presentation, is stable disease  [name] Wo Contrast  Result Date: 07/25/2018 CLINICAL DATA:  Frontal glioblastoma.  Continued surveillance. EXAM: MRI HEAD WITHOUT AND WITH CONTRAST TECHNIQUE: Multiplanar, multiecho pulse sequences of the brain and surrounding structures were obtained without and with intravenous contrast. CONTRAST:  Gadavist 10 mL. COMPARISON:  Most recent MR 05/16/2018. FINDINGS: Brain: Slightly improved, with decreased measurable cross-section of enhancing tumor representing neovascularity of a LEFT frontal parasagittal glioblastoma multiforme, 22 x 31 mm as compared with 23 x 32 mm most recent priors. Adjacent prominent T2 and FLAIR hyperintensity not significantly changed. More low level, confluent subcortical and periventricular white matter signal abnormality, also stable. Chronic blood products line the surgical bed as well as adjacent to/within the tumor. Vascular: Normal flow voids. Skull and upper cervical spine: Normal marrow signal. Stable LEFT craniotomy. Sinuses/Orbits: Negative. Other: None. IMPRESSION: Slightly decreased cross-section of enhancing tumor, no other significant interval change. Electronically Signed   By: Staci Righter M.D.   On: 07/25/2018 13:58    Assessment/Plan 1. Glioblastoma ([place])  2. Seizure [facility])  Mr. Prichett is clinically and radiographically stable today.      We recommended continuing treatment with cycle #7 Temozolomide 150 mg/m2, on for five days and off for twenty three days in twenty eight day cycles. The patient will have a complete blood count performed on days 21 and 28 of each cycle, and a comprehensive metabolic panel performed on day 28 of each cycle.  Labs may need to be performed more often. Zofran will prescribed for home use for nausea/vomiting.   Chemotherapy should be held for the following:  ANC less than 1,000  Platelets less than 100,000  LFT or creatinine greater than 2x ULN  If clinical concerns/contraindications develop  He should continue Keppra and Eliquis at established doses.  We recommend he return in 4 weeks with an MRI brain for evaluation.  All questions were answered. The patient knows to call the clinic with any problems,  questions or concerns. No barriers to learning were detected.  The total time spent in the encounter was 25 minutes and more than 50% was on counseling and review of test results   Ventura Sellers, MD Medical Director of Neuro-Oncology [facility] at [place] 07/31/18 12:43 PM

## 2018-07-31 NOTE — Telephone Encounter (Signed)
Printed calendar and avs. °

## 2018-08-28 ENCOUNTER — Inpatient Hospital Stay (HOSPITAL_BASED_OUTPATIENT_CLINIC_OR_DEPARTMENT_OTHER): Payer: Medicare Other | Admitting: Internal Medicine

## 2018-08-28 ENCOUNTER — Inpatient Hospital Stay: Payer: Medicare Other | Attending: Internal Medicine

## 2018-08-28 ENCOUNTER — Telehealth: Payer: Self-pay

## 2018-08-28 ENCOUNTER — Encounter: Payer: Self-pay | Admitting: Internal Medicine

## 2018-08-28 ENCOUNTER — Other Ambulatory Visit: Payer: Self-pay

## 2018-08-28 VITALS — BP 133/65 | HR 89 | Temp 98.2°F | Resp 18 | Ht 76.0 in | Wt 206.1 lb

## 2018-08-28 DIAGNOSIS — C711 Malignant neoplasm of frontal lobe: Secondary | ICD-10-CM | POA: Diagnosis not present

## 2018-08-28 DIAGNOSIS — R569 Unspecified convulsions: Secondary | ICD-10-CM | POA: Insufficient documentation

## 2018-08-28 DIAGNOSIS — C719 Malignant neoplasm of brain, unspecified: Secondary | ICD-10-CM

## 2018-08-28 LAB — CBC WITH DIFFERENTIAL (CANCER CENTER ONLY)
Abs Immature Granulocytes: 0.03 10*3/uL (ref 0.00–0.07)
Basophils Absolute: 0 10*3/uL (ref 0.0–0.1)
Basophils Relative: 1 %
Eosinophils Absolute: 0.2 10*3/uL (ref 0.0–0.5)
Eosinophils Relative: 3 %
HCT: 39.9 % (ref 39.0–52.0)
Hemoglobin: 12.9 g/dL — ABNORMAL LOW (ref 13.0–17.0)
Immature Granulocytes: 1 %
LYMPHS ABS: 1.9 10*3/uL (ref 0.7–4.0)
Lymphocytes Relative: 34 %
MCH: 29.4 pg (ref 26.0–34.0)
MCHC: 32.3 g/dL (ref 30.0–36.0)
MCV: 90.9 fL (ref 80.0–100.0)
Monocytes Absolute: 0.5 10*3/uL (ref 0.1–1.0)
Monocytes Relative: 8 %
Neutro Abs: 3 10*3/uL (ref 1.7–7.7)
Neutrophils Relative %: 53 %
Platelet Count: 184 10*3/uL (ref 150–400)
RBC: 4.39 MIL/uL (ref 4.22–5.81)
RDW: 12.7 % (ref 11.5–15.5)
WBC Count: 5.5 10*3/uL (ref 4.0–10.5)
nRBC: 0 % (ref 0.0–0.2)

## 2018-08-28 LAB — CMP (CANCER CENTER ONLY)
ALBUMIN: 4.1 g/dL (ref 3.5–5.0)
ALT: 10 U/L (ref 0–44)
AST: 12 U/L — AB (ref 15–41)
Alkaline Phosphatase: 75 U/L (ref 38–126)
Anion gap: 10 (ref 5–15)
BUN: 8 mg/dL (ref 8–23)
CO2: 26 mmol/L (ref 22–32)
Calcium: 9.5 mg/dL (ref 8.9–10.3)
Chloride: 106 mmol/L (ref 98–111)
Creatinine: 1.07 mg/dL (ref 0.61–1.24)
GFR, Est AFR Am: >60 mL/min (ref >60)
GFR, Estimated: >60 mL/min (ref >60)
Glucose, Bld: 143 mg/dL — ABNORMAL HIGH (ref 70–99)
POTASSIUM: 4.2 mmol/L (ref 3.5–5.1)
Sodium: 142 mmol/L (ref 135–145)
Total Bilirubin: 1 mg/dL (ref 0.3–1.2)
Total Protein: 6.9 g/dL (ref 6.5–8.1)

## 2018-08-28 MED ORDER — TEMOZOLOMIDE 20 MG PO CAPS: 40.0000 mg | ORAL_CAPSULE | Freq: Every day | ORAL | 0 refills | Status: DC

## 2018-08-28 MED ORDER — TEMOZOLOMIDE 140 MG PO CAPS: 280.0000 mg | ORAL_CAPSULE | Freq: Every day | ORAL | 0 refills | Status: DC

## 2018-08-28 NOTE — Progress Notes (Signed)
[facility] at [place], [place] [address] [phone]   Interval Evaluation  Date of Service: 08/28/18 Patient Name: Christopher Morris Patient MRN: [id number] Patient DOB: [birth year] Provider: Ventura Sellers, MD  Identifying Statement:  Kevonta Phariss is a 78 y.o. male with left frontal glioblastoma    Oncologic History:   Frontal glioblastoma multiforme ([place])   09/30/2017 Surgery    Craniotomy, resection by Dr. Sherwood Gambler.  Path demonstrates glioblastoma    10/28/2017 -  Radiation Therapy    IMRT with concurrent Temodar    01/07/2018 -  Chemotherapy    The patient had [No matching medication found in this treatment plan]  for chemotherapy treatment.      Biomarkers:  MGMT Methylated.  IDH 1/2 Unknown.  EGFR Amplified  EGFRvIII positive   Interval History:  Terryon Pineiro presents today for follow up after completing 7th cycle of adjuvant Temodar.  He doesn't describe significant fatigue during treatment week.  Continues to walk with his walker, but also ambulates independently at home. Otherwise denies seizures, headaches, new or progressive neurologic deficits.    Medications: Current Outpatient Medications on File Prior to Visit  Medication Sig Dispense Refill  . apixaban (ELIQUIS) 5 MG TABS tablet Take 10 mg BID till 12/24/2017, start taking 5 mg BID from 12/25/2017 60 tablet 0  . levETIRAcetam (KEPPRA) 1000 MG tablet Take 1 tablet (1,000 mg total) by mouth 2 (two) times daily. 30 tablet 1  . ondansetron (ZOFRAN) 8 MG tablet Take 1 tablet (8 mg total) by mouth 2 (two) times daily as needed. Start on the third day after chemotherapy. (Patient not taking: Reported on 07/31/2018) 30 tablet 1  . temozolomide (TEMODAR) 140 MG capsule Take 2 capsules (280 mg total) by mouth daily. May take on an empty stomach to decrease nausea & vomiting. (Patient not taking: Reported on 07/31/2018) 10 capsule 0  . temozolomide (TEMODAR) 140  MG capsule Take 2 capsules (280 mg total) by mouth daily. May take on an empty stomach to decrease nausea & vomiting. (Patient not taking: Reported on 07/31/2018) 10 capsule 0  . temozolomide (TEMODAR) 140 MG capsule Take 2 capsules (280 mg total) by mouth daily. May take on an empty stomach to decrease nausea & vomiting. (Patient not taking: Reported on 07/31/2018) 10 capsule 0  . temozolomide (TEMODAR) 140 MG capsule Take 2 capsules (280 mg total) by mouth daily. May take on an empty stomach to decrease nausea & vomiting. (Patient not taking: Reported on 07/31/2018) 10 capsule 0  . temozolomide (TEMODAR) 140 MG capsule Take 2 capsules (280 mg total) by mouth daily. May take on an empty stomach to decrease nausea & vomiting. 10 capsule 0  . temozolomide (TEMODAR) 20 MG capsule Take 2 capsules (40 mg total) by mouth daily. May take on an empty stomach to decrease nausea & vomiting. (Patient not taking: Reported on 07/31/2018) 10 capsule 0  . temozolomide (TEMODAR) 20 MG capsule Take 2 capsules (40 mg total) by mouth daily. May take on an empty stomach to decrease nausea & vomiting. (Patient not taking: Reported on 07/31/2018) 10 capsule 0  . temozolomide (TEMODAR) 20 MG capsule Take 2 capsules (40 mg total) by mouth daily. May take on an empty stomach to decrease nausea & vomiting. (Patient not taking: Reported on 07/31/2018) 10 capsule 0  . temozolomide (TEMODAR) 20 MG capsule Take 2 capsules (40 mg total) by mouth daily. May take on an empty stomach to decrease  nausea & vomiting. (Patient not taking: Reported on 07/31/2018) 10 capsule 0  . temozolomide (TEMODAR) 20 MG capsule Take 2 capsules (40 mg total) by mouth daily. May take on an empty stomach to decrease nausea & vomiting. 10 capsule 0   No current facility-administered medications on file prior to visit.     Allergies:  Allergies  Allergen Reactions  . Pork-Derived Products     Patient does not eat pork  . Shellfish Allergy     Patient does not  eat shellfish   Past Medical History:  Past Medical History:  Diagnosis Date  . Brain cancer ([place])   . Glioblastoma ([place]) 09/25/2017  . Hyperglycemia 09/25/2017  . Seizure ([place]) 09/25/2017   Past Surgical History:  Past Surgical History:  Procedure Laterality Date  . APPLICATION OF CRANIAL NAVIGATION N/A 09/30/2017   Procedure: APPLICATION OF CRANIAL NAVIGATION;  Surgeon: [name], MD;  Location: [facility];  Service: Neurosurgery;  Laterality: N/A;  . CRANIOTOMY N/A 09/30/2017   Procedure: CRANIOTOMY FOR RESECTION OF BRAIN TUMOR WITH STEREOTACTIC;  Surgeon: [name], MD;  Location: [facility];  Service: Neurosurgery;  Laterality: N/A;  CRANIOTOMY FOR RESECTION OF BRAIN TUMOR WITH STEREOTACTIC   Social History:  Social History   Socioeconomic History  . Marital status: Divorced    Spouse name: Not on file  . Number of children: Not on file  . Years of education: Not on file  . Highest education level: Not on file  Occupational History  . Not on file  Social Needs  . Financial resource strain: Not on file  . Food insecurity:    Worry: Not on file    Inability: Not on file  . Transportation needs:    Medical: No    Non-medical: No  Tobacco Use  . Smoking status: Never Smoker  . Smokeless tobacco: Never Used  Substance and Sexual Activity  . Alcohol use: No    Frequency: Never  . Drug use: No  . Sexual activity: Not Currently  Lifestyle  . Physical activity:    Days per week: 5 days    Minutes per session: Not on file  . Stress: Only a little  Relationships  . Social connections:    Talks on phone: Not on file    Gets together: Not on file    Attends religious service: Not on file    Active member of club or organization: Not on file    Attends meetings of clubs or organizations: Not on file    Relationship status: Not on file  . Intimate partner violence:    Fear of current or ex partner: Not on file    Emotionally abused: Not on file    Physically abused:  Not on file    Forced sexual activity: Not on file  Other Topics Concern  . Not on file  Social History Narrative  . Not on file   Family History:  Family History  Problem Relation Age of Onset  . Hypertension Daughter   . Stroke Mother   . Cancer Neg Hx     Review of Systems: Constitutional: Denies fevers, chills or abnormal weight loss Eyes: Denies blurriness of vision Ears, nose, mouth, throat, and face: Denies mucositis or sore throat Respiratory: Denies cough, dyspnea or wheezes Cardiovascular: Denies palpitation, chest discomfort or lower extremity swelling Gastrointestinal:  Denies nausea, constipation, diarrhea GU: Denies dysuria or incontinence Skin: Denies abnormal skin rashes Neurological: Per HPI Musculoskeletal: Denies joint pain, back or neck discomfort.  No decrease in ROM Behavioral/Psych: Denies anxiety, disturbance in thought content, and mood instability  Physical Exam: Vitals:   08/28/18 1231  BP: 133/65  Pulse: 89  Resp: 18  Temp: 98.2 F (36.8 C)  SpO2: 100%   KPS: 80. General: Alert, cooperative, pleasant, in no acute distress Head: Craniotomy scar noted, dry and intact. EENT: No conjunctival injection or scleral icterus. Oral mucosa moist Lungs: Resp effort normal Cardiac: Regular rate and rhythm Abdomen: Soft, non-distended abdomen Skin: No rashes cyanosis or petechiae. Extremities: No clubbing or edema  Neurologic Exam: Mental Status: Awake, alert, attentive to examiner. Oriented to self and environment. Language has mild impairment in fluency with intact comprehension and repetition.  Cranial Nerves: Visual acuity is grossly normal. Visual fields are full. Extra-ocular movements intact. No ptosis. Face is symmetric, tongue midline. Motor: Tone and bulk are normal. Pronator drift right arm, left side 5/5 power. Reflexes are symmetric, no pathologic reflexes present. Intact finger to nose bilaterally Sensory: Intact to light touch and  temperature Gait: Independent but purposeful and wide based   Labs: I have reviewed the data as listed    Component Value Date/Time   NA 142 08/28/2018 1216   NA 136 (A) 10/18/2017   K 4.2 08/28/2018 1216   CL 106 08/28/2018 1216   CO2 26 08/28/2018 1216   GLUCOSE 143 (H) 08/28/2018 1216   BUN 8 08/28/2018 1216   BUN 19 10/18/2017   CREATININE 1.07 08/28/2018 1216   CALCIUM 9.5 08/28/2018 1216   PROT 6.9 08/28/2018 1216   ALBUMIN 4.1 08/28/2018 1216   AST 12 (L) 08/28/2018 1216   ALT 10 08/28/2018 1216   ALKPHOS 75 08/28/2018 1216   BILITOT 1.0 08/28/2018 1216   GFRNONAA >60 08/28/2018 1216   GFRAA >60 08/28/2018 1216   Lab Results  Component Value Date   WBC 5.5 08/28/2018   NEUTROABS 3.0 08/28/2018   HGB 12.9 (L) 08/28/2018   HCT 39.9 08/28/2018   MCV 90.9 08/28/2018   PLT 184 08/28/2018    Assessment/Plan 1. Glioblastoma ([place])  2. Seizure [facility])  Mr. Jue is clinically stable today.  Labs are within normal limits.      We recommended continuing treatment with cycle #8 Temozolomide 150 mg/m2, on for five days and off for twenty three days in twenty eight day cycles. The patient will have a complete blood count performed on days 21 and 28 of each cycle, and a comprehensive metabolic panel performed on day 28 of each cycle. Labs may need to be performed more often. Zofran will prescribed for home use for nausea/vomiting.   Chemotherapy should be held for the following:  ANC less than 1,000  Platelets less than 100,000  LFT or creatinine greater than 2x ULN  If clinical concerns/contraindications develop  He should continue Keppra and Eliquis at established doses.  We recommend he return in 6 weeks with an MRI brain for evaluation.  All questions were answered. The patient knows to call the clinic with any problems, questions or concerns. No barriers to learning were detected.  The total time spent in the encounter was 25 minutes and more than 50% was on  counseling and review of test results   Ventura Sellers, MD Medical Director of Neuro-Oncology [facility] at [place] 08/28/18 12:31 PM

## 2018-08-28 NOTE — Telephone Encounter (Signed)
Printed avs and calender of upcoming appointment. Per 2/13 los 

## 2018-09-08 ENCOUNTER — Telehealth: Payer: Self-pay

## 2018-09-08 NOTE — Telephone Encounter (Signed)
Oral Oncology Patient Advocate Encounter  I received notification that the patients daughter, [name] is concerned because they have not received his Temodar in the mail yet. I called [facility] to follow up and they stated that [facility] needs new prescriptions. I sent a staff message with this information and an expedited fax # to get this done quickly for the patient [phone].  I called [name] to give her this information and had to leave a voicemail.  [place] Patient [place] Phone [phone] Fax [phone] 09/08/2018   4:17 PM

## 2018-09-09 ENCOUNTER — Other Ambulatory Visit: Payer: Self-pay | Admitting: Internal Medicine

## 2018-09-09 DIAGNOSIS — C711 Malignant neoplasm of frontal lobe: Secondary | ICD-10-CM

## 2018-09-09 MED ORDER — TEMOZOLOMIDE 20 MG PO CAPS: 40.0000 mg | ORAL_CAPSULE | Freq: Every day | ORAL | 0 refills | Status: DC

## 2018-09-09 MED ORDER — TEMOZOLOMIDE 140 MG PO CAPS: 280.0000 mg | ORAL_CAPSULE | Freq: Every day | ORAL | 0 refills | Status: DC

## 2018-09-11 ENCOUNTER — Telehealth: Payer: Self-pay | Admitting: *Deleted

## 2018-09-11 NOTE — Telephone Encounter (Signed)
Patients daughter called to confirm they received the medication 2 days ago.  They are planned to start drug 09/15/2018.  She also advised patient fell last night.  He denies hitting his head or causing any injury.

## 2018-09-15 MED FILL — ONDANSETRON HCL 8 MG TABLET: 8 | 15 days supply | Qty: 30 | Fill #1

## 2018-10-01 ENCOUNTER — Other Ambulatory Visit: Payer: Self-pay | Admitting: Radiation Therapy

## 2018-10-03 ENCOUNTER — Other Ambulatory Visit: Payer: Self-pay

## 2018-10-03 ENCOUNTER — Ambulatory Visit (HOSPITAL_COMMUNITY)
Admission: RE | Admit: 2018-10-03 | Discharge: 2018-10-03 | Disposition: A | Discharge status: Home / Self Care | Payer: Medicare Other | Source: Ambulatory Visit | Attending: Internal Medicine | Admitting: Internal Medicine

## 2018-10-03 DIAGNOSIS — C711 Malignant neoplasm of frontal lobe: Secondary | ICD-10-CM

## 2018-10-03 MED ORDER — GADOBUTROL 1 MMOL/ML IV SOLN
10.0000 mL | Freq: Once | INTRAVENOUS | Status: AC | PRN
  Administered 2018-10-03: 9 mL via INTRAVENOUS

## 2018-10-08 ENCOUNTER — Inpatient Hospital Stay (HOSPITAL_BASED_OUTPATIENT_CLINIC_OR_DEPARTMENT_OTHER): Payer: Medicare Other | Admitting: Internal Medicine

## 2018-10-08 ENCOUNTER — Other Ambulatory Visit: Payer: Self-pay

## 2018-10-08 ENCOUNTER — Inpatient Hospital Stay: Payer: Medicare Other | Attending: Internal Medicine

## 2018-10-08 DIAGNOSIS — C719 Malignant neoplasm of brain, unspecified: Secondary | ICD-10-CM

## 2018-10-08 DIAGNOSIS — Z79899 Other long term (current) drug therapy: Secondary | ICD-10-CM

## 2018-10-08 DIAGNOSIS — C711 Malignant neoplasm of frontal lobe: Secondary | ICD-10-CM | POA: Diagnosis not present

## 2018-10-08 LAB — CMP (CANCER CENTER ONLY)
ALBUMIN: 4.2 g/dL (ref 3.5–5.0)
ALT: 11 U/L (ref 0–44)
AST: 13 U/L — AB (ref 15–41)
Alkaline Phosphatase: 80 U/L (ref 38–126)
Anion gap: 9 (ref 5–15)
BUN: 8 mg/dL (ref 8–23)
CO2: 27 mmol/L (ref 22–32)
Calcium: 9.5 mg/dL (ref 8.9–10.3)
Chloride: 105 mmol/L (ref 98–111)
Creatinine: 0.95 mg/dL (ref 0.61–1.24)
GFR, Est AFR Am: >60 mL/min (ref >60)
GFR, Estimated: >60 mL/min (ref >60)
Glucose, Bld: 102 mg/dL — ABNORMAL HIGH (ref 70–99)
Potassium: 4.5 mmol/L (ref 3.5–5.1)
Sodium: 141 mmol/L (ref 135–145)
Total Bilirubin: 1.2 mg/dL (ref 0.3–1.2)
Total Protein: 7.4 g/dL (ref 6.5–8.1)

## 2018-10-08 LAB — CBC WITH DIFFERENTIAL (CANCER CENTER ONLY)
Abs Immature Granulocytes: 0.01 10*3/uL (ref 0.00–0.07)
Basophils Absolute: 0.1 10*3/uL (ref 0.0–0.1)
Basophils Relative: 1 %
Eosinophils Absolute: 0.1 10*3/uL (ref 0.0–0.5)
Eosinophils Relative: 2 %
HCT: 43.2 % (ref 39.0–52.0)
Hemoglobin: 14.1 g/dL (ref 13.0–17.0)
Immature Granulocytes: 0 %
Lymphocytes Relative: 45 %
Lymphs Abs: 2.9 10*3/uL (ref 0.7–4.0)
MCH: 29.3 pg (ref 26.0–34.0)
MCHC: 32.6 g/dL (ref 30.0–36.0)
MCV: 89.6 fL (ref 80.0–100.0)
MONO ABS: 0.6 10*3/uL (ref 0.1–1.0)
Monocytes Relative: 9 %
Neutro Abs: 2.9 10*3/uL (ref 1.7–7.7)
Neutrophils Relative %: 43 %
Platelet Count: 216 10*3/uL (ref 150–400)
RBC: 4.82 MIL/uL (ref 4.22–5.81)
RDW: 12.8 % (ref 11.5–15.5)
WBC Count: 6.6 10*3/uL (ref 4.0–10.5)
nRBC: 0 % (ref 0.0–0.2)

## 2018-10-09 ENCOUNTER — Other Ambulatory Visit: Payer: Medicare Other

## 2018-10-09 ENCOUNTER — Telehealth: Payer: Non-veteran care | Admitting: Internal Medicine

## 2018-10-09 ENCOUNTER — Other Ambulatory Visit: Payer: Self-pay | Admitting: Internal Medicine

## 2018-10-09 ENCOUNTER — Telehealth: Payer: Self-pay | Admitting: Hematology

## 2018-10-09 DIAGNOSIS — C711 Malignant neoplasm of frontal lobe: Secondary | ICD-10-CM

## 2018-10-09 MED ORDER — TEMOZOLOMIDE 20 MG PO CAPS: 40.0000 mg | ORAL_CAPSULE | Freq: Every day | ORAL | 0 refills | Status: DC

## 2018-10-09 MED ORDER — TEMOZOLOMIDE 140 MG PO CAPS: 280.0000 mg | ORAL_CAPSULE | Freq: Every day | ORAL | 0 refills | Status: DC

## 2018-10-09 NOTE — Telephone Encounter (Signed)
No 3/25 los.  

## 2018-10-09 NOTE — Progress Notes (Signed)
I connected with Christopher Morris on 10/09/18 at  2:00 PM EDT by telephone and verified that I am speaking with the correct person using two identifiers.    I discussed the limitations, risks, security and privacy concerns of performing an evaluation and management service by telephone and the avilability of in person appointments.  I also discussed with the patient that there may be a patient responsible charge related to this service.  The patient expressed understanding and agreed to proceed.  History of Present Ilness: No clinical changes, no recent seizures or headaches Observations: Conversant, alert.   Assessment and Plan: MRI brain overall stable with only subtle changes.  Continue with next cycle of 5-day temozolomide  Follow Up Instructions: Labs and visit or phone call in one month  I discussed the assessment and treatment plan with the patient.  The patient was provided an opportunity to ask questions and all were answered.  The patient agreed with the plan and demonstrated understanding of the instructions.    The patient was advised to call back or seek an in-person evaluation if the symptoms worsen or if the condition fails to improve as anticipated.  I provided 5-10 minutes of non-face-to-face time during this enocunter.  Ventura Sellers, MD

## 2018-10-14 ENCOUNTER — Telehealth: Payer: Self-pay | Admitting: Internal Medicine

## 2018-10-14 NOTE — Telephone Encounter (Signed)
Called patient regarding upcoming appointments. Patient did not pick up, left a voicemail. Calender will be mailed.

## 2018-10-14 NOTE — Telephone Encounter (Signed)
Scheduled appt per 3/25 sch message - unable to reach patient - sent letter in the mail.

## 2018-10-24 ENCOUNTER — Other Ambulatory Visit: Payer: Self-pay | Admitting: Internal Medicine

## 2018-10-24 ENCOUNTER — Telehealth: Payer: Self-pay | Admitting: *Deleted

## 2018-10-24 DIAGNOSIS — C711 Malignant neoplasm of frontal lobe: Secondary | ICD-10-CM

## 2018-10-24 IMAGING — DX DG CHEST 2V
2 series · 2 of 2 positions shown · non-contrast
Comparison: May 03, 2016

CLINICAL DATA: Seizure

EXAM:
CHEST - 2 VIEW

[chest lat]
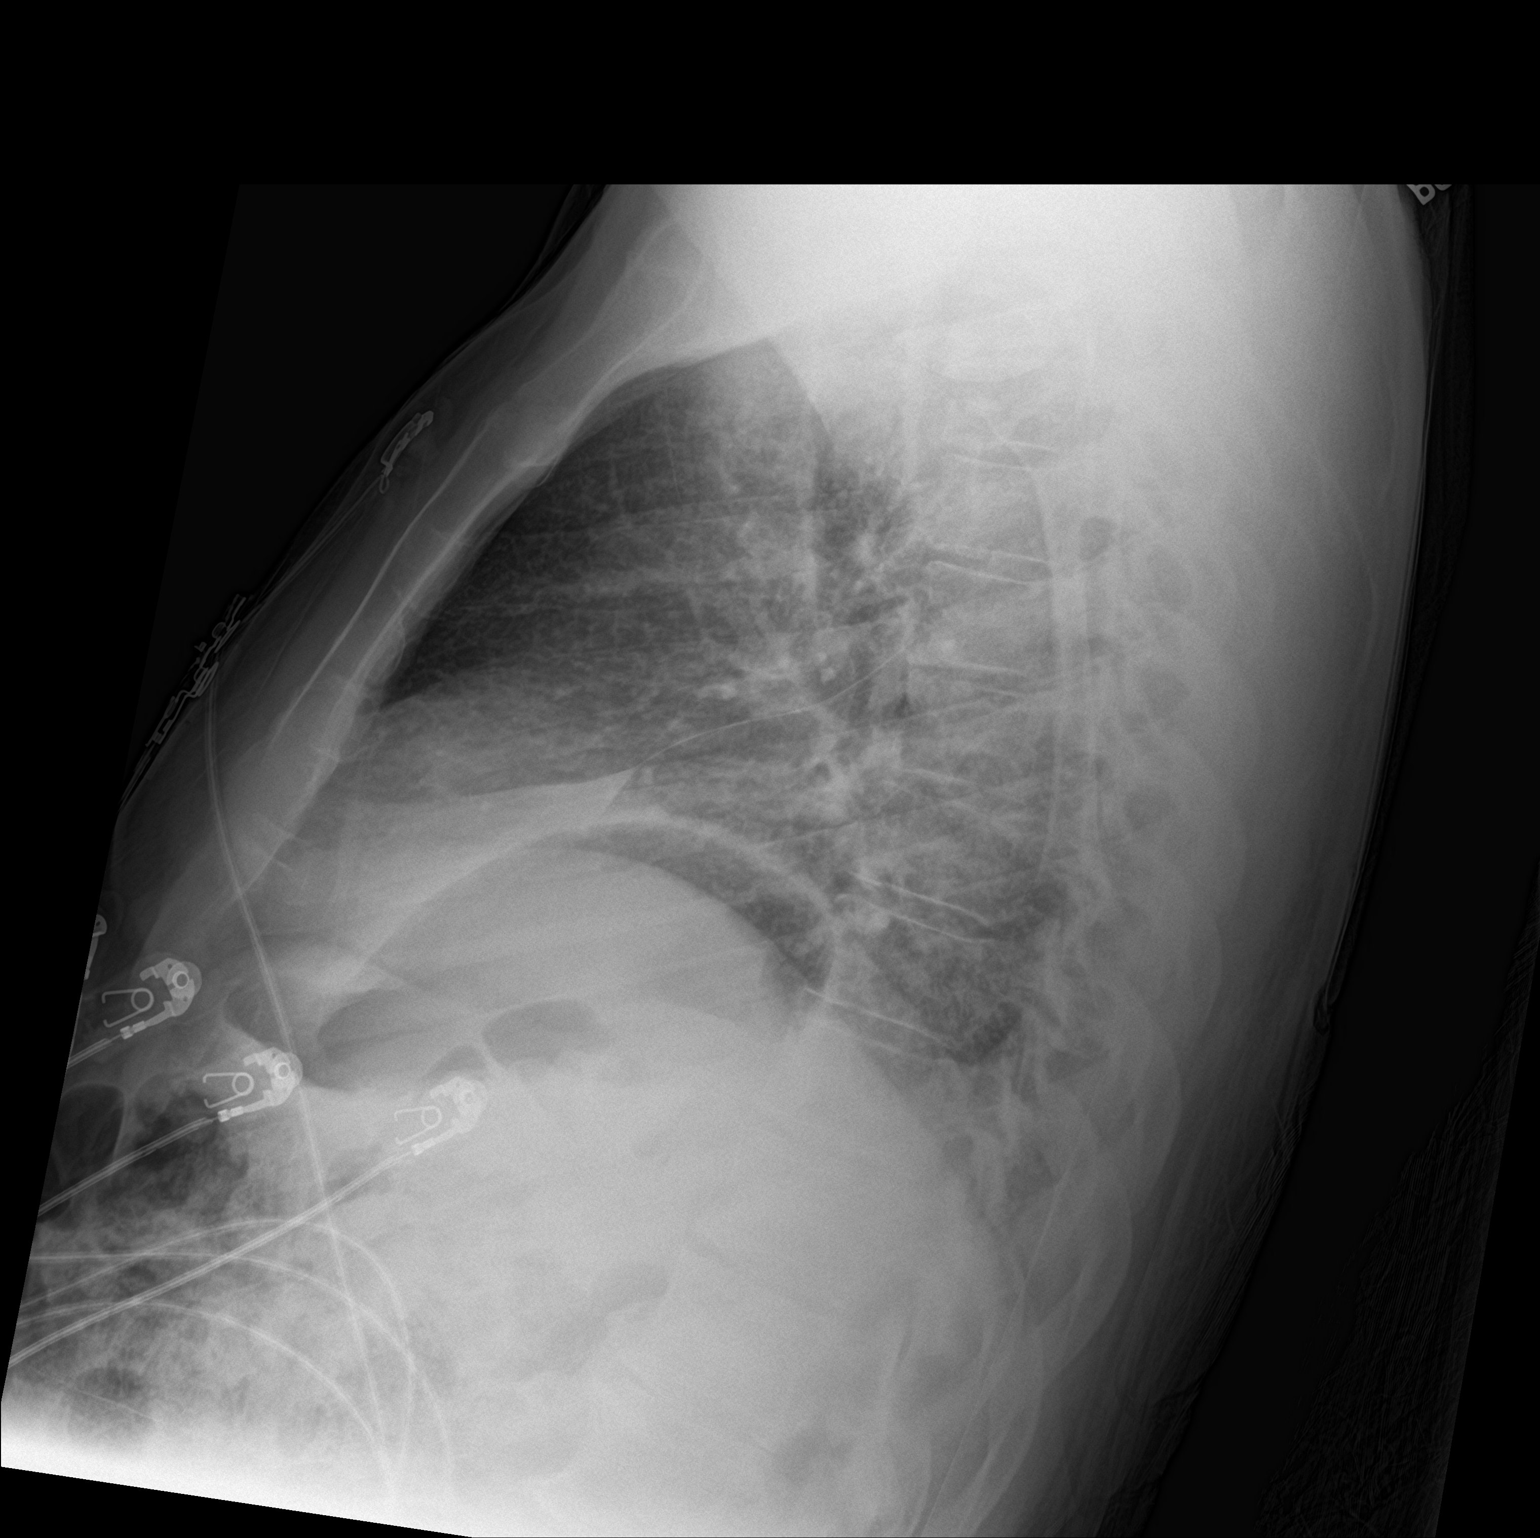

[chest ap]
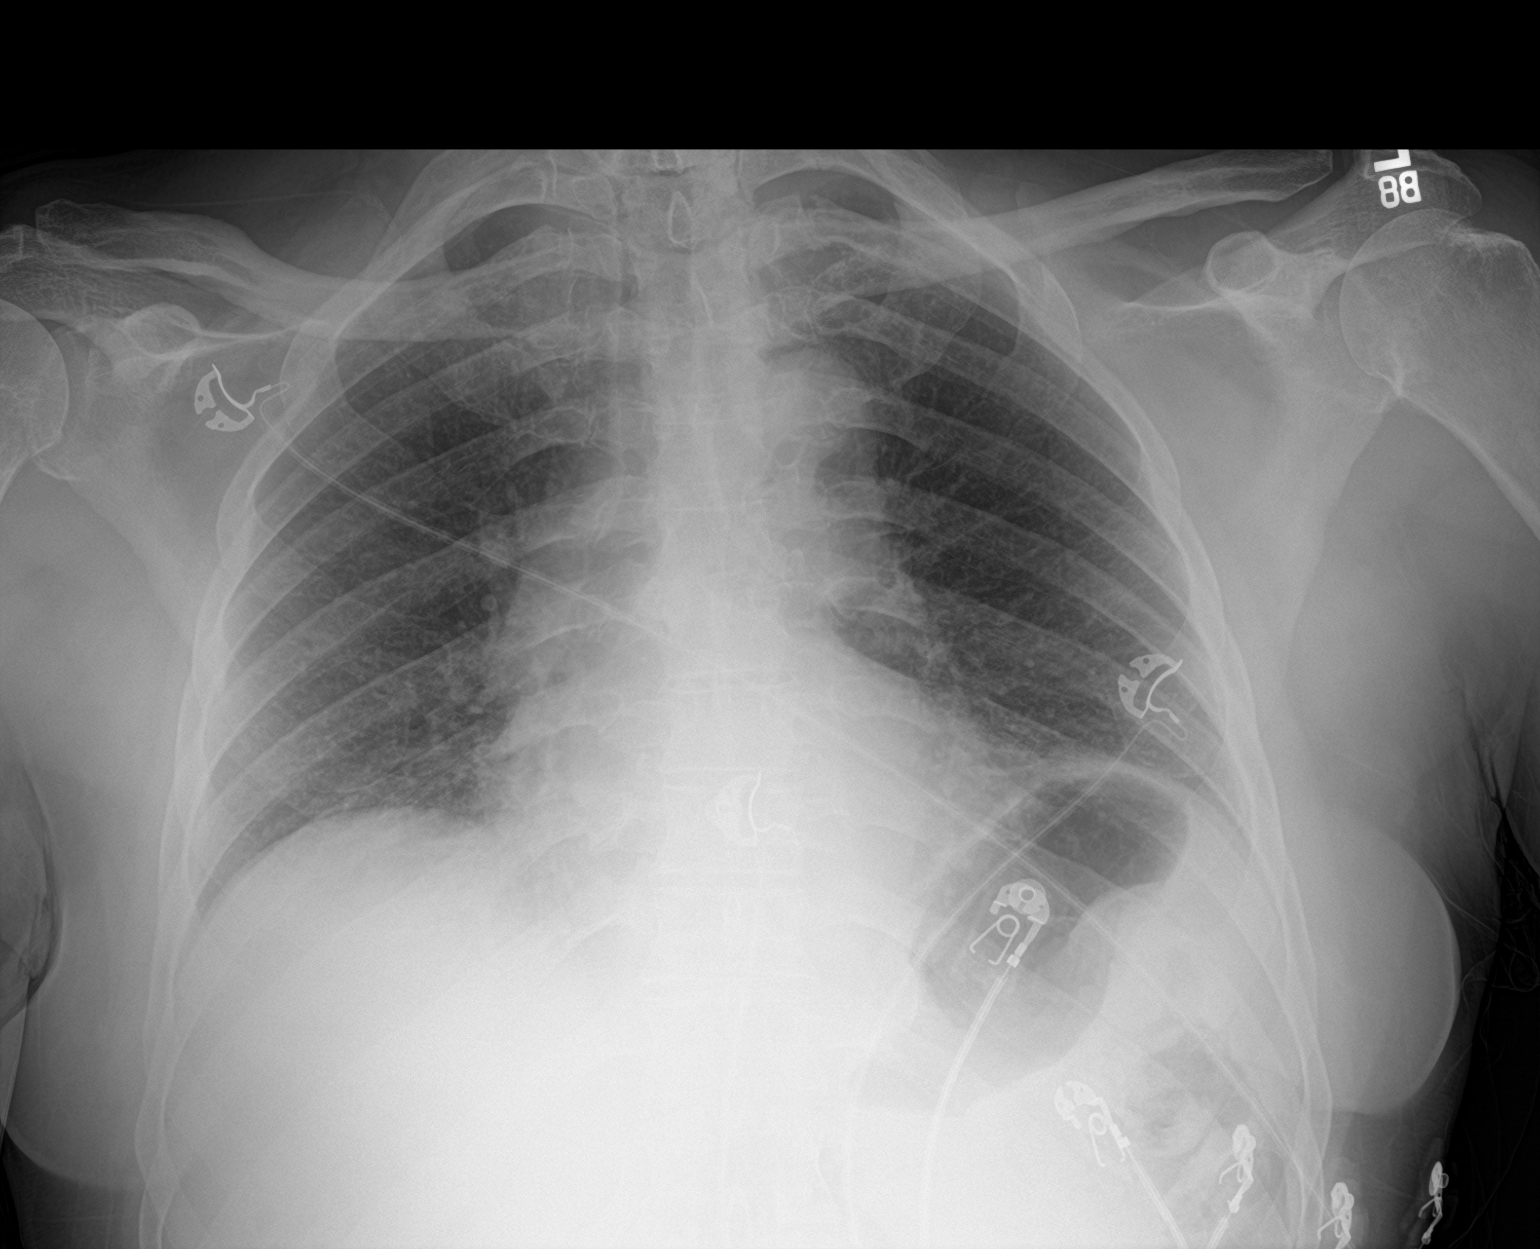

[2 of 2 positions shown; findings below may reference images not displayed]

FINDINGS: There is no edema or consolidation. The heart size and pulmonary
vascularity are normal. No adenopathy. No pneumothorax. No bone
lesions.
IMPRESSION: No edema or consolidation.

## 2018-10-24 IMAGING — CT CT HEAD W/O CM
3 of 4 series · 13 of 47 positions shown, 15 images · non-contrast
Comparison: None.

CLINICAL DATA: Seizure and fall out of bed.

EXAM:
CT HEAD WITHOUT CONTRAST
CT CERVICAL SPINE WITHOUT CONTRAST
TECHNIQUE: Multidetector CT imaging of the head and cervical spine was
performed following the standard protocol without intravenous
contrast. Multiplanar CT image reconstructions of the cervical spine
were also generated.

[Series 3: head wo · axial · 0.42mm/px · z∈[-164,-54]mm · 7 of 30 slices shown, 9 images]
[im 4/30  brain]
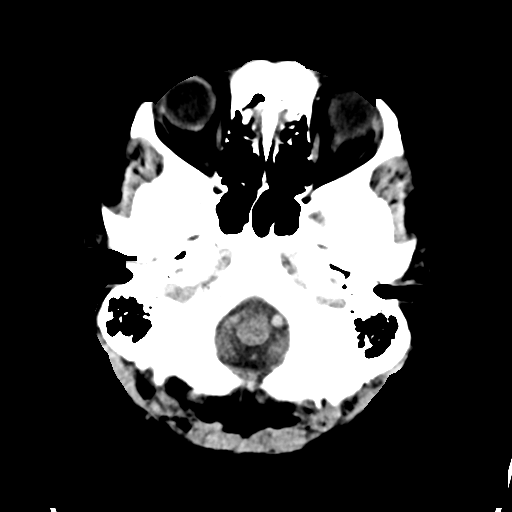
[im 4/30  bone]
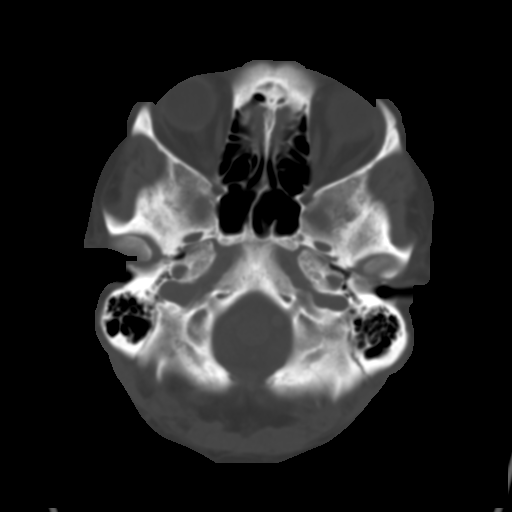
[im 8/30  brain]
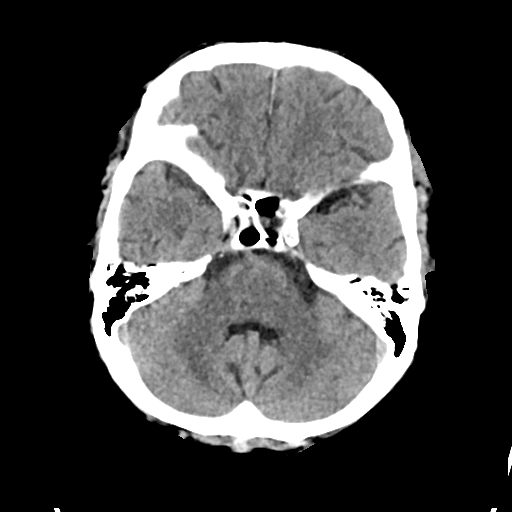
[im 11/30  brain]
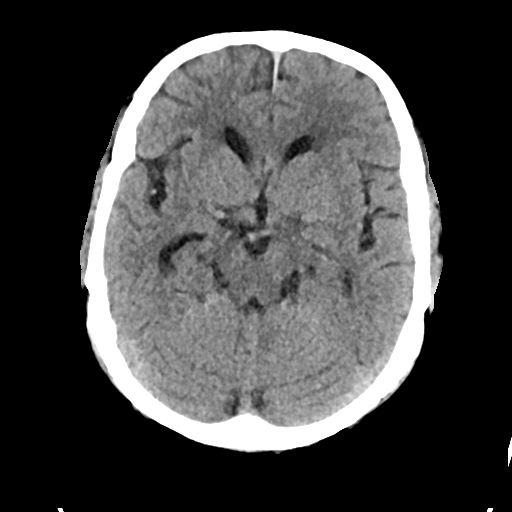
[im 15/30  brain]
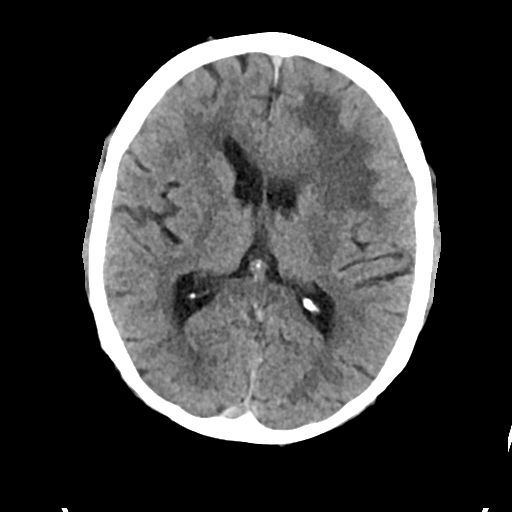
[im 19/30  brain]
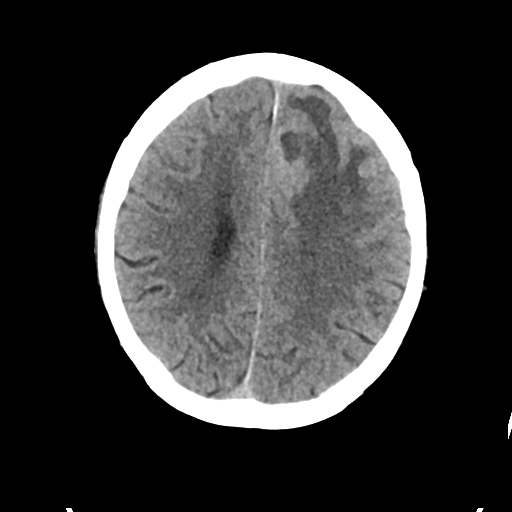
[im 19/30  bone]
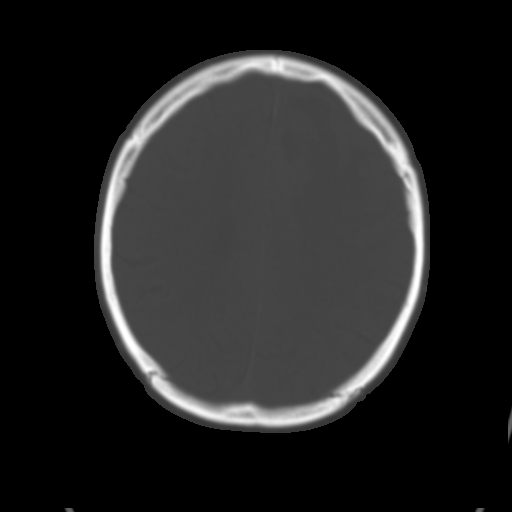
[im 22/30  brain]
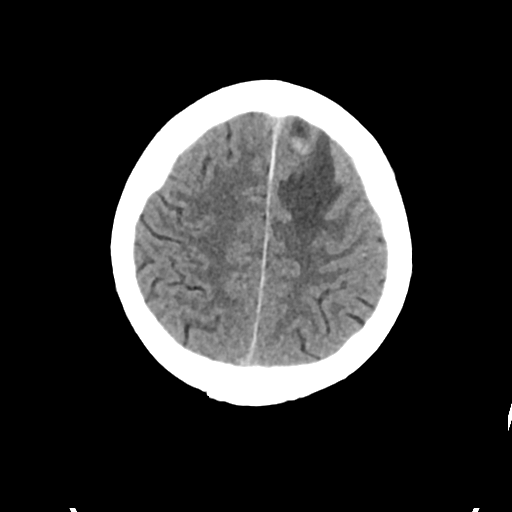
[im 26/30  brain]
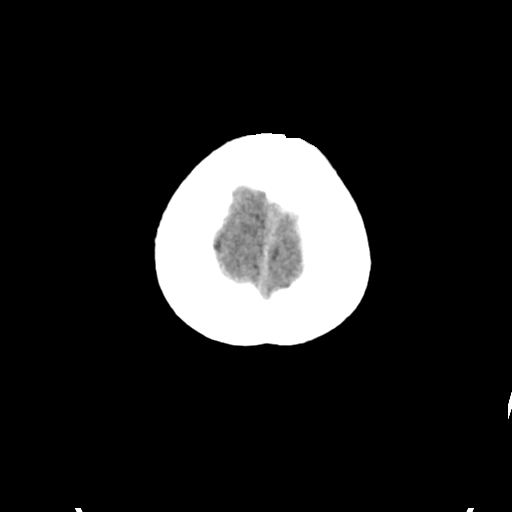

[Series 5: cor soft · coronal · 0.29mm/px · 3 of 67 slices shown]
[im 23/67  brain]
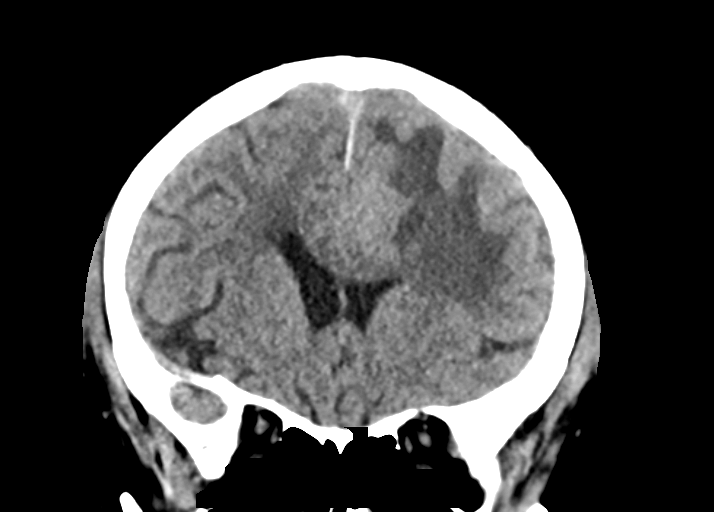
[im 30/67  brain]
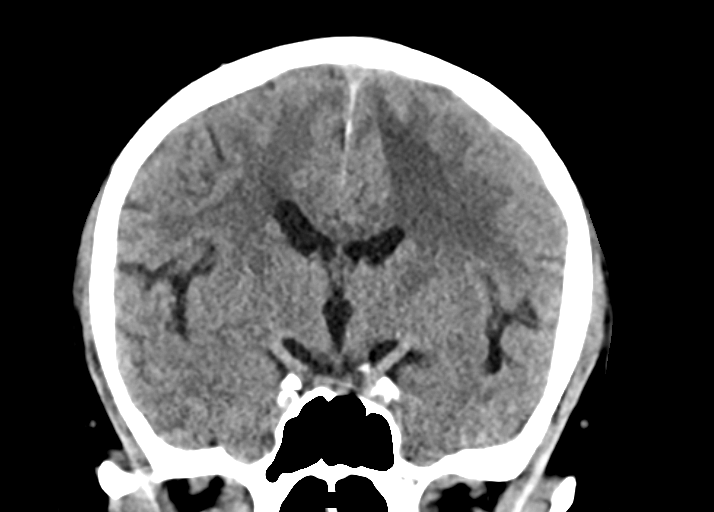
[im 37/67  brain]
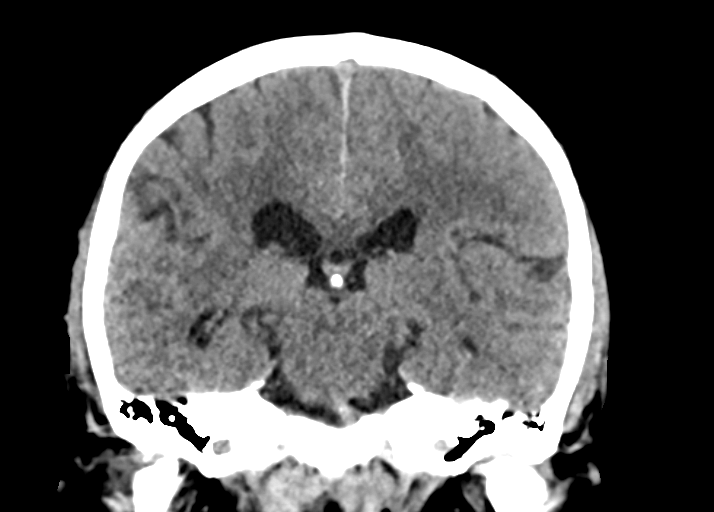

[Series 6: sag soft · sagittal · 0.29mm/px · 3 of 62 slices shown]
[im 21/62  brain]
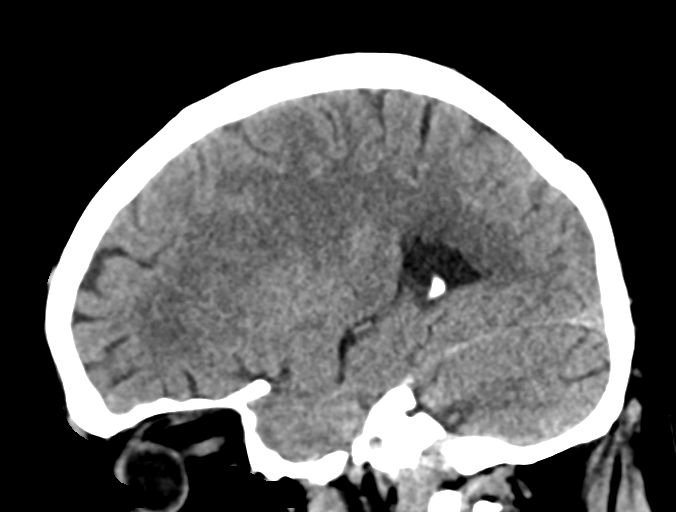
[im 31/62  brain]
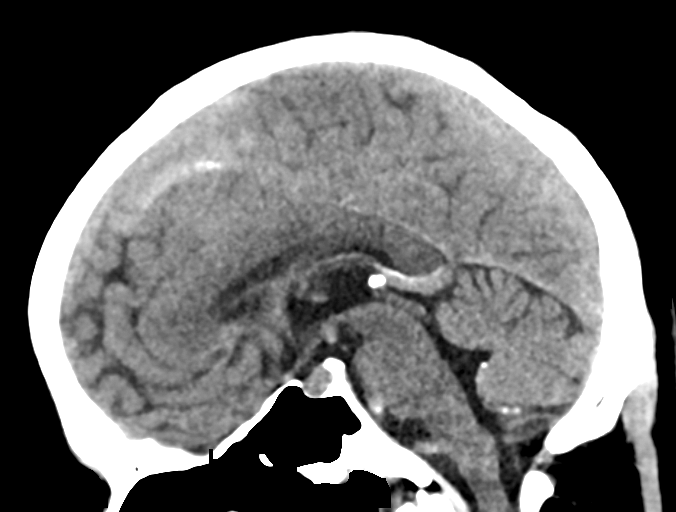
[im 41/62  brain]
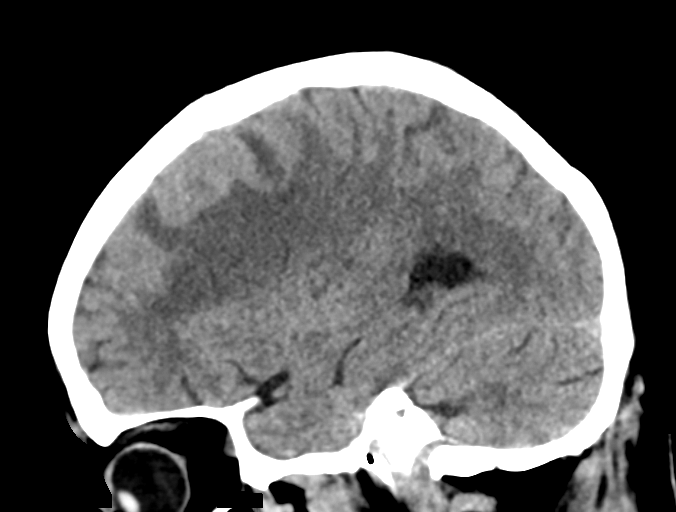

[13 of 47 positions shown; findings below may reference images not displayed]

FINDINGS: CT HEAD FINDINGS

Brain: There is a possible 3.0 cm mass in the left frontal lobe with
prominent surrounding vasogenic edema. Gray-white matter
differentiation is preserved. Small amount of hyperdensity along the
superior aspect of the mass may reflect hemorrhage. There is slight
rightward shift of the anterior falx and effacement of the left
lateral ventricle frontal horn. No hydrocephalus or extra-axial
collection.

Vascular: Atherosclerotic vascular calcification of the carotid
siphons. No hyperdense vessel.

Skull: Normal. Negative for fracture or focal lesion.

Sinuses/Orbits: No acute finding.

Other: None.

CT CERVICAL SPINE FINDINGS

Alignment: Reversal of the normal cervical lordosis. No traumatic
malalignment.

Skull base and vertebrae: No acute fracture. No primary bone lesion
or focal pathologic process.

Soft tissues and spinal canal: No prevertebral fluid or swelling. No
visible canal hematoma.

Disc levels: Moderate to severe degenerative disc disease and facet
uncovertebral hypertrophy throughout the cervical spine. Multilevel
moderate to severe neuroforaminal stenoses.

Upper chest: Negative.

Other: None.
IMPRESSION: 1. Probable 3.0 cm mass in the left frontal lobe with prominent
surrounding vasogenic edema. Small amount of hyperdensity along the
superior aspect of the mass likely reflects a hemorrhagic component.
Findings less likely represent hemorrhagic infarct given relative
preservation of the gray-white differentiation. Recommend
contrast-enhanced MRI of the brain for further evaluation.
2. No acute cervical spine fracture. Moderate to severe degenerative
changes throughout the cervical spine.

Critical Value/emergent results were called by telephone at the time
of interpretation on 09/25/2017 at [DATE] to Dr. MADGE CAMARENA ,
who verbally acknowledged these results.

## 2018-10-24 IMAGING — CT CT CHEST W/ CM
2 of 5 series · 13 of 36 positions shown, 16 images · IV contrast (Omni 300)
Comparison: None

CLINICAL DATA: Brain mass.  Evaluate for primary neoplasm.

EXAM:
CT CHEST, ABDOMEN, AND PELVIS WITH CONTRAST
TECHNIQUE: Multidetector CT imaging of the chest, abdomen and pelvis was
performed following the standard protocol during bolus
administration of intravenous contrast.
CONTRAST:  100mL XSXPZC-WYY IOPAMIDOL (XSXPZC-WYY) INJECTION 61%

[Series 3: cap with 5mm st · axial · 0.82mm/px · z∈[+897,+1452]mm · 10 of 137 slices shown, 13 images]
[im 13/137  mediastinal]
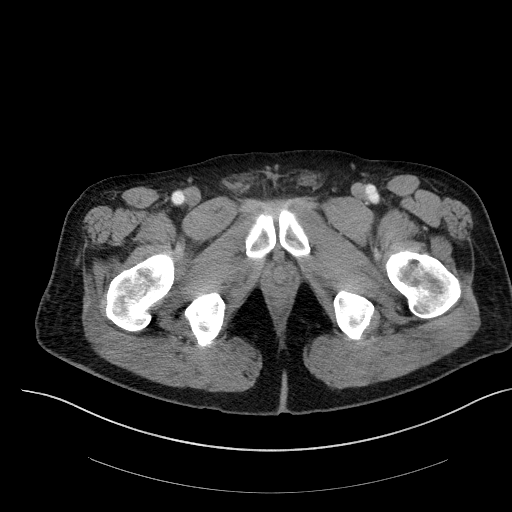
[im 13/137  lung]
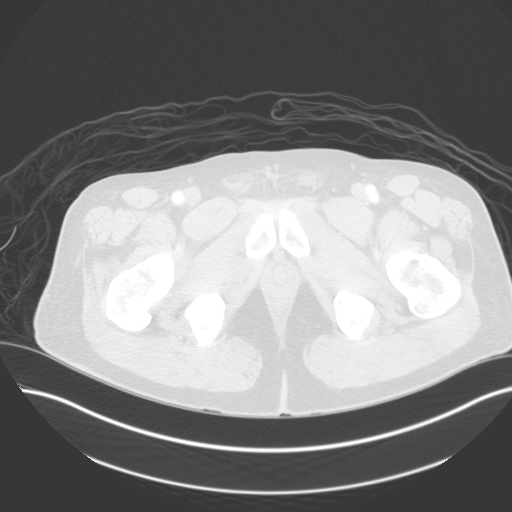
[im 25/137  lung]
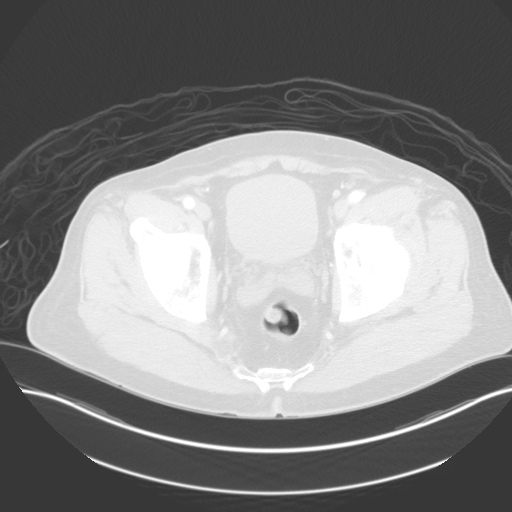
[im 38/137  lung]
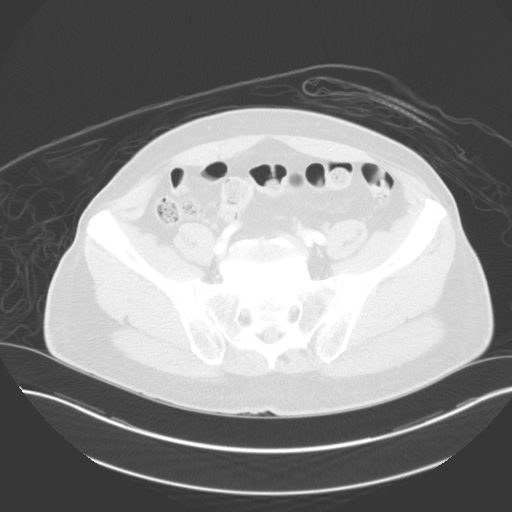
[im 50/137  lung]
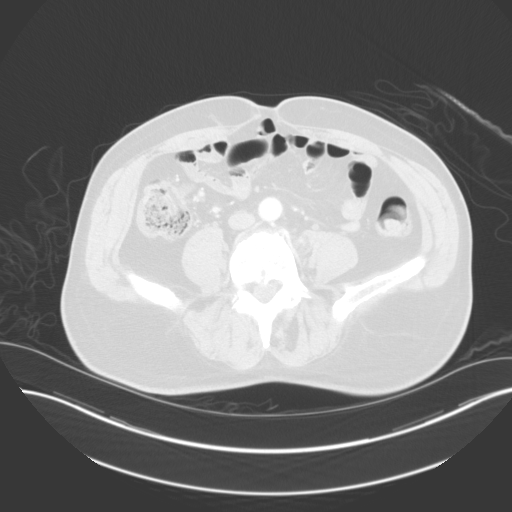
[im 62/137  mediastinal]
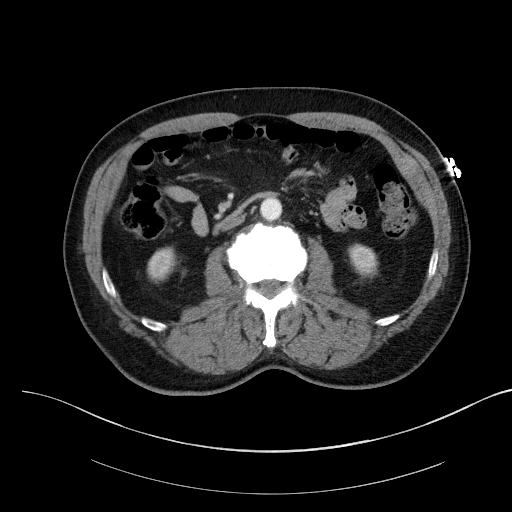
[im 62/137  lung]
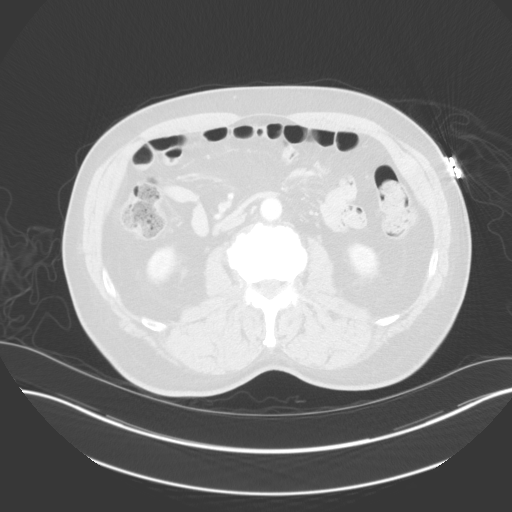
[im 75/137  lung]
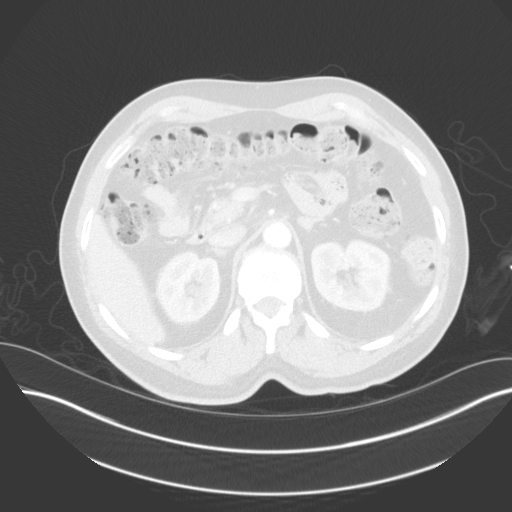
[im 87/137  lung]
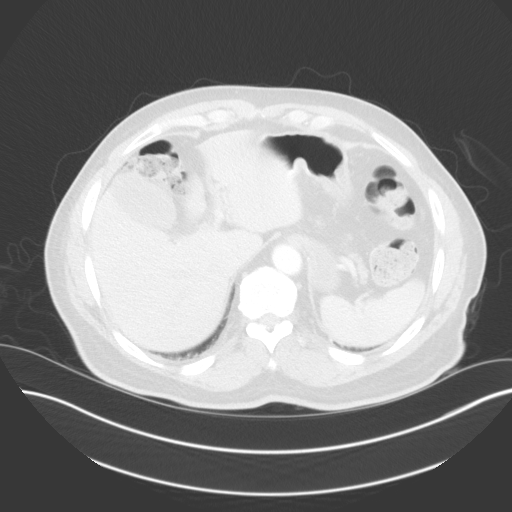
[im 99/137  lung]
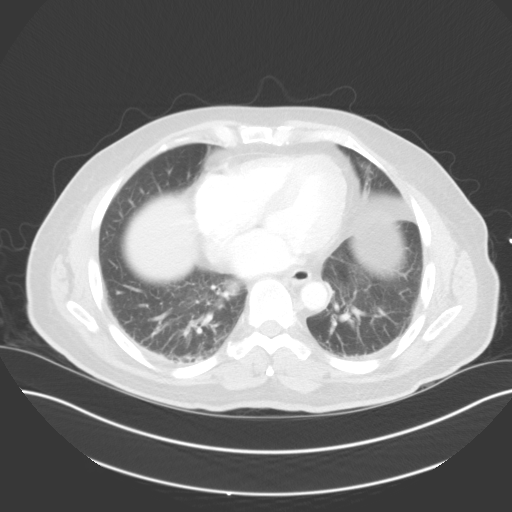
[im 112/137  mediastinal]
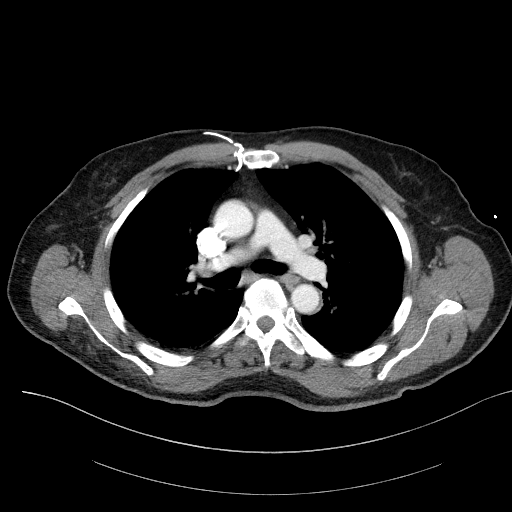
[im 112/137  lung]
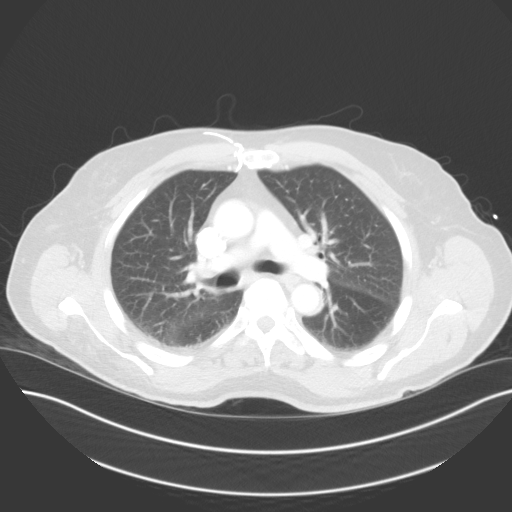
[im 124/137  lung]
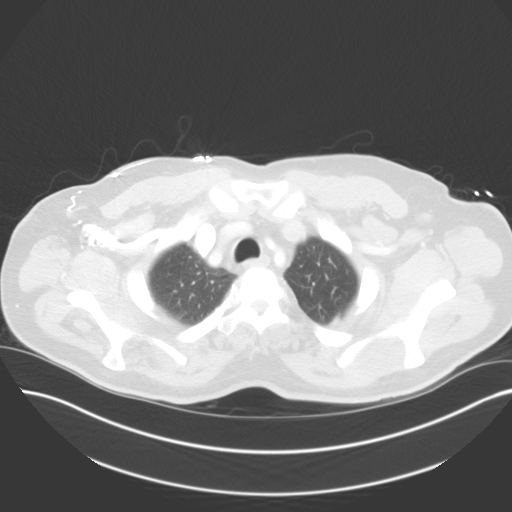

[Series 5: cap with 3mm st cor · coronal · 0.66mm/px · 3 of 144 slices shown]
[im 29/144  lung]
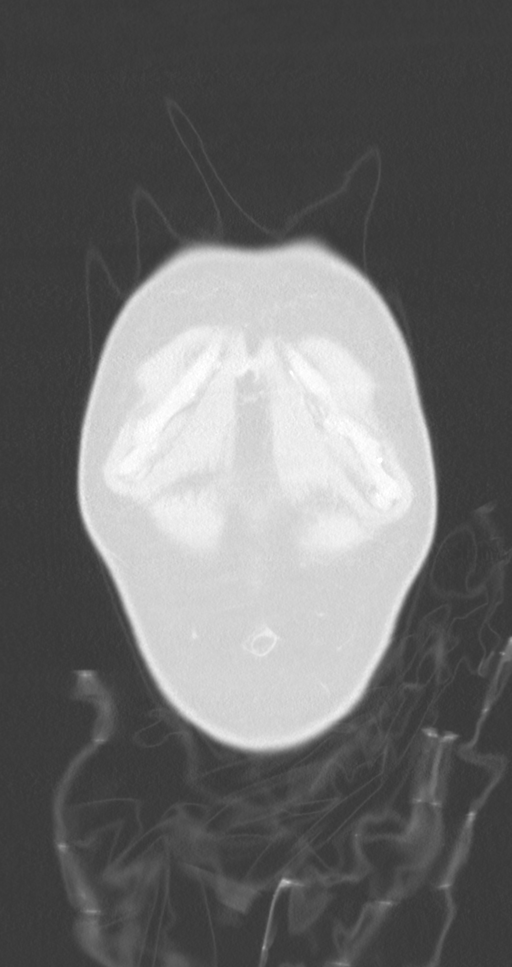
[im 58/144  lung]
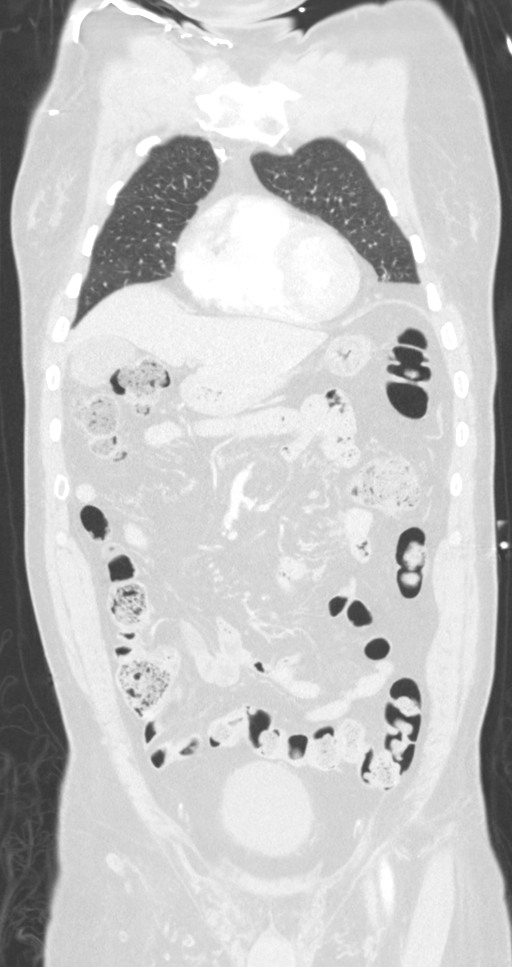
[im 86/144  lung]
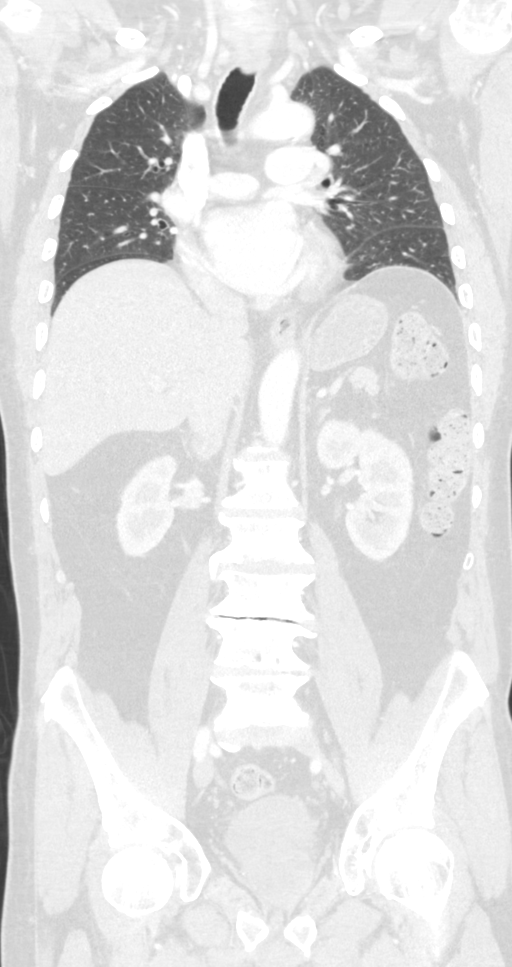

[13 of 36 positions shown; findings below may reference images not displayed]

FINDINGS: CT CHEST FINDINGS

Cardiovascular: The heart size appears within normal limits. Aortic
atherosclerosis. No pericardial effusion.

Mediastinum/Nodes: Normal appearance of the thyroid gland. The
trachea appears patent and is midline. Normal appearance of the
esophagus. No enlarged mediastinal or hilar lymph nodes.

Lungs/Pleura: No pleural effusion. Mild subsegmental atelectasis or
scar noted in the lung bases. No suspicious pulmonary nodule or mass
identified.

Musculoskeletal: No aggressive lytic or sclerotic bone lesions.

CT ABDOMEN PELVIS FINDINGS

Hepatobiliary: No focal liver abnormality is seen. No gallstones,
gallbladder wall thickening, or biliary dilatation.

Pancreas: Unremarkable. No pancreatic ductal dilatation or
surrounding inflammatory changes.

Spleen: Normal in size without focal abnormality.

Adrenals/Urinary Tract: Normal appearance of the adrenal glands. The
kidneys are both unremarkable. Urinary bladder appears normal.

Stomach/Bowel: Stomach normal. The small bowel loops have a normal
course and caliber. The appendix is visualized and appears normal.
Unremarkable appearance of the colon.

Vascular/Lymphatic: Aortic atherosclerosis. No aneurysm. No
abdominal adenopathy. No pelvic or inguinal adenopathy.

Reproductive: Prostate gland is unremarkable.

Other: No free fluid or fluid collections. No peritoneal nodularity.

Musculoskeletal: Degenerative disc disease identified within the
lumbar spine. No aggressive lytic or sclerotic bone lesion.
IMPRESSION: 1. No findings identified to indicate site of primary neoplastic
process to explain brain mass.
2.  Aortic Atherosclerosis ([id number]).

## 2018-10-24 IMAGING — MR MR HEAD WO/W CM
11 of 14 series · 29 of 48 positions shown · IV contrast (multihance)
Comparison: Head CT without contrast 1923 hours today.

CLINICAL DATA: 76-year-old male found down by bed. Possible
seizure, incontinent. Combative.

EXAM:
MRI HEAD WITHOUT AND WITH CONTRAST
TECHNIQUE: Multiplanar, multiecho pulse sequences of the brain and surrounding
structures were obtained without and with intravenous contrast.
CONTRAST:  20mL MULTIHANCE GADOBENATE DIMEGLUMINE 529 MG/ML IV SOLN

[Series 3: DWI · axial · 3.0mm · 0.94mm/px · z∈[-94,+50]mm · 6 of 100 slices shown (1 of 2)]
[im 1/100]
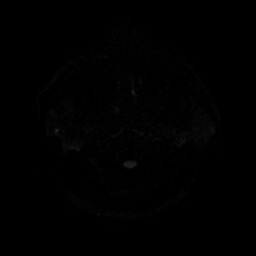
[im 20/100]
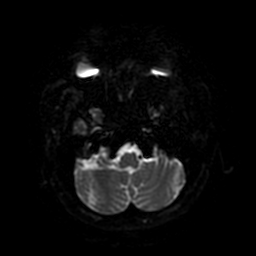
[im 40/100]
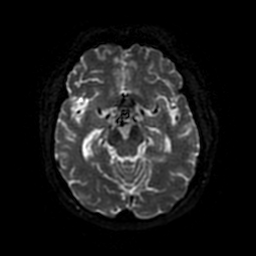
[im 60/100]
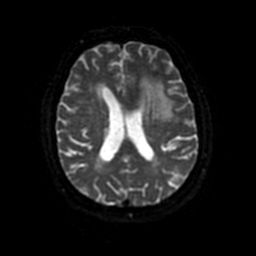
[im 80/100]
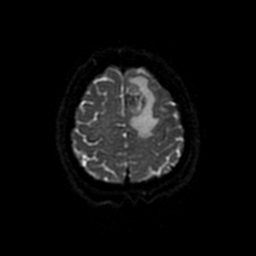
[im 100/100]
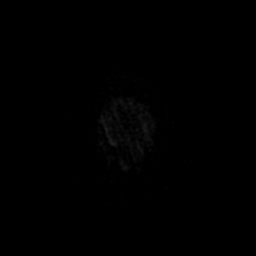

[Series 4: DWI · coronal · 4.0mm · 0.94mm/px · 5 of 72 slices shown (2 of 2)]
[im 1/72]
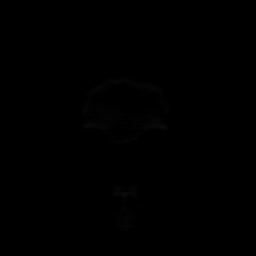
[im 18/72]
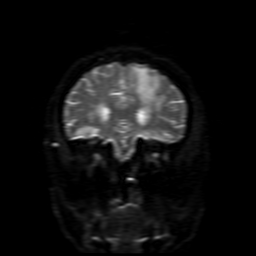
[im 36/72]
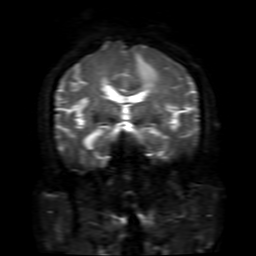
[im 54/72]
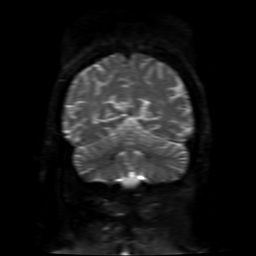
[im 72/72]
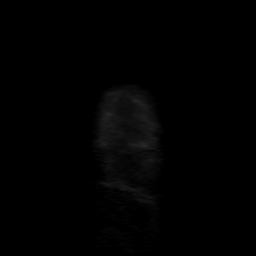

[Series 5: FLAIR · sagittal · 5.0mm · 0.47mm/px · 2 of 25 slices shown (1 of 3)]
[im 1/25]
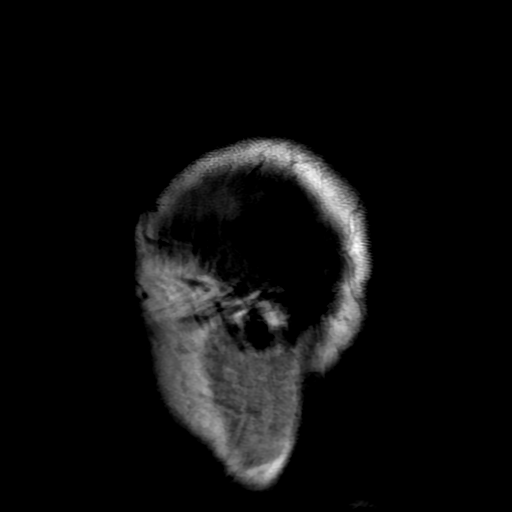
[im 25/25]
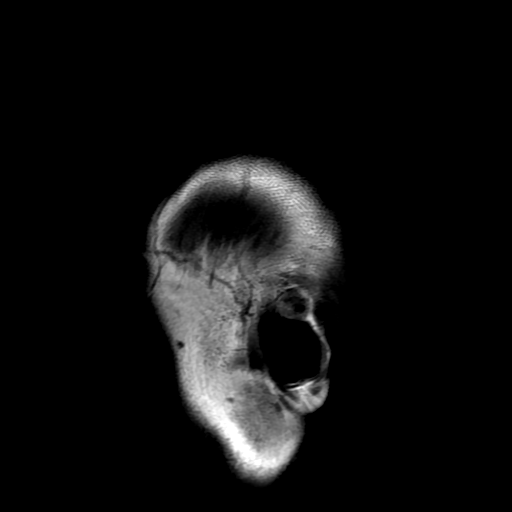

[Series 6: T2 · axial · 5.0mm · 0.47mm/px · z∈[-92,+48]mm · 2 of 25 slices shown]
[im 1/25]
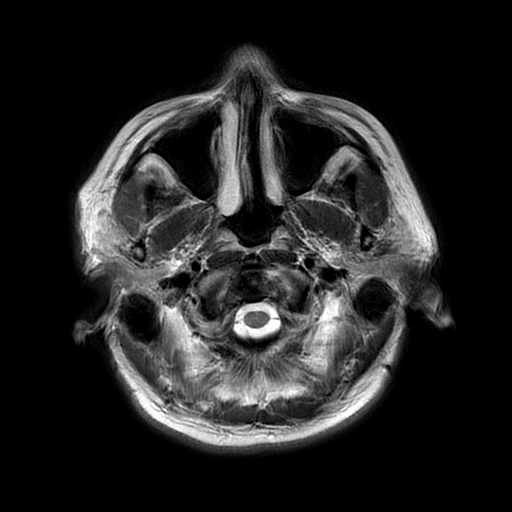
[im 25/25]
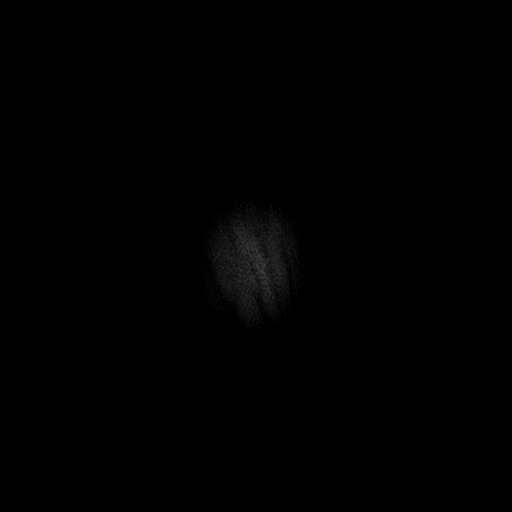

[Series 11: FLAIR · axial · 3.0mm · 0.41mm/px · z∈[-89,+51]mm · 2 of 25 slices shown (2 of 3)]
[im 1/25]
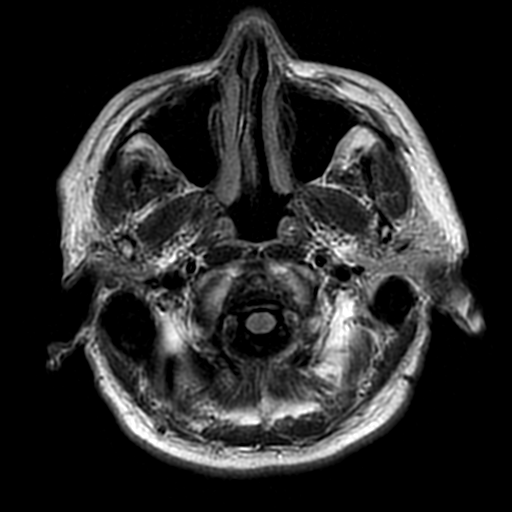
[im 25/25]
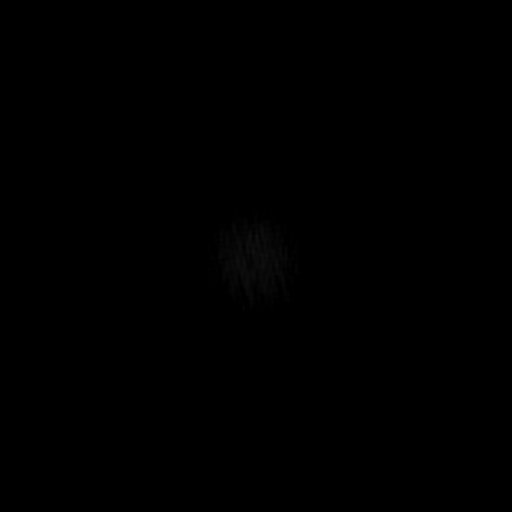

[Series 13: (person_name) · axial · 3.0mm · 0.47mm/px · 1 of 100 slices shown]
[im 1/100]
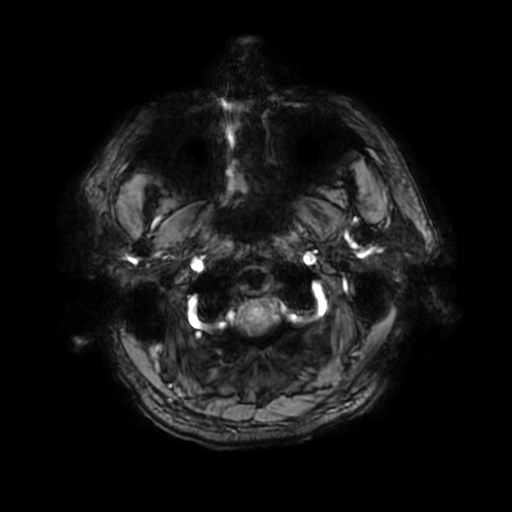

[Series 15: T2 post-contrast · coronal · 5.0mm · 0.39mm/px · 2 of 30 slices shown]
[im 1/30]
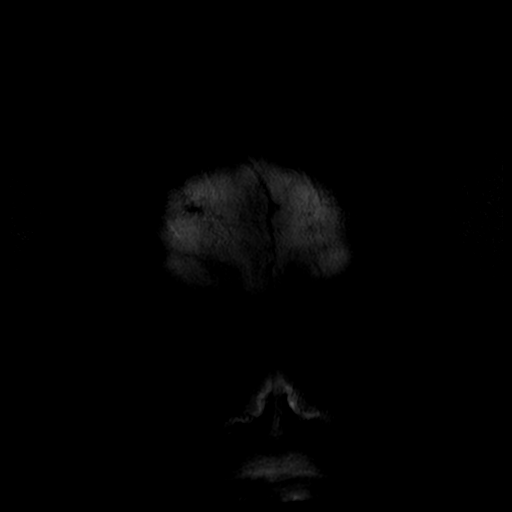
[im 30/30]
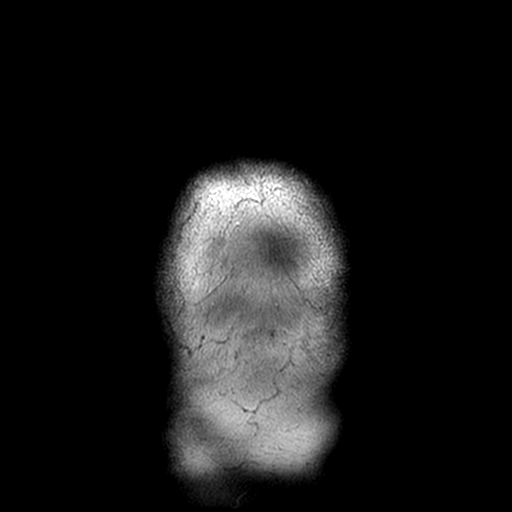

[Series 17: T1 · coronal · 5.0mm · 0.39mm/px · 2 of 30 slices shown]
[im 1/30]
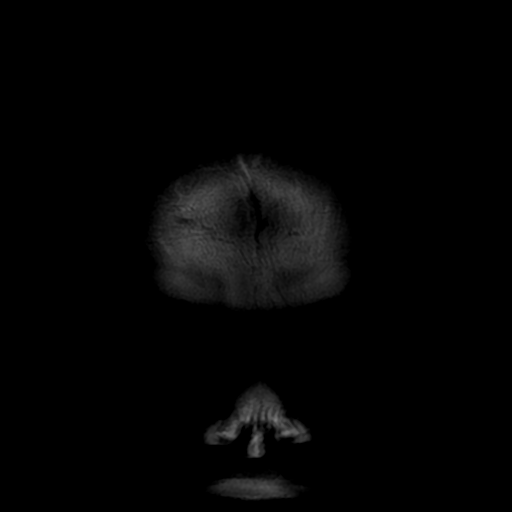
[im 30/30]
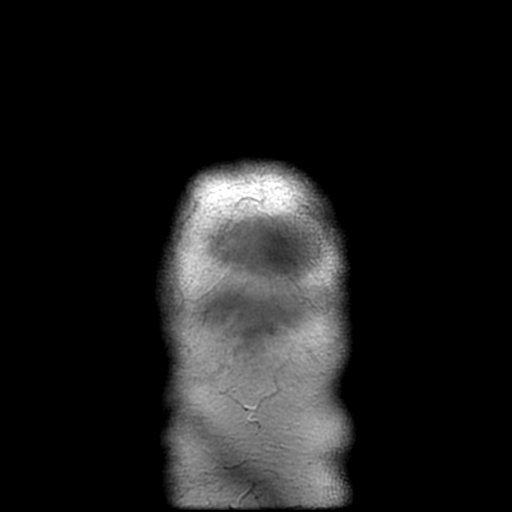

[Series 18: FLAIR · sagittal · 5.0mm · 0.47mm/px · 2 of 25 slices shown (3 of 3)]
[im 1/25]
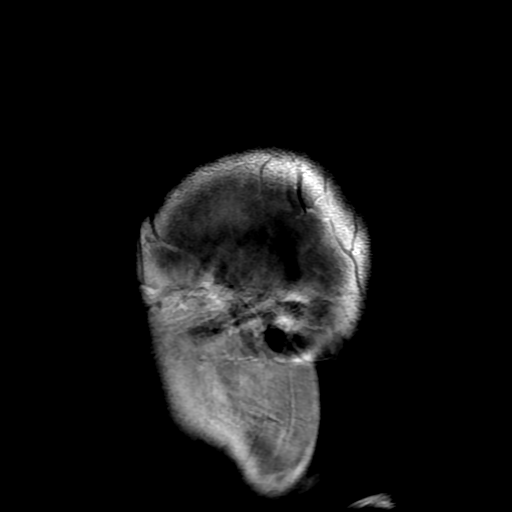
[im 25/25]
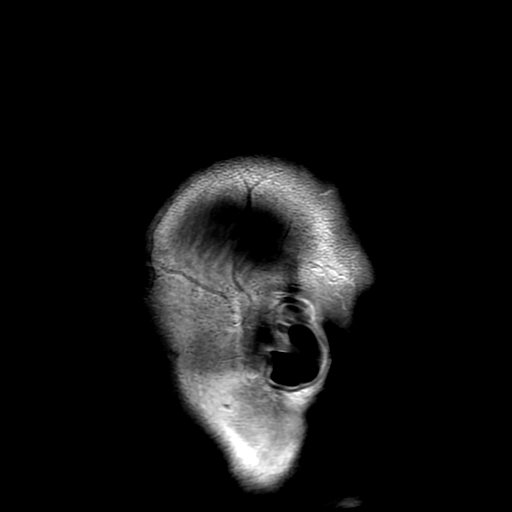

[Series 350: ADC · axial · 3.0mm · 0.94mm/px · z∈[-94,+50]mm · 3 of 50 slices shown (1 of 2)]
[im 1/50]
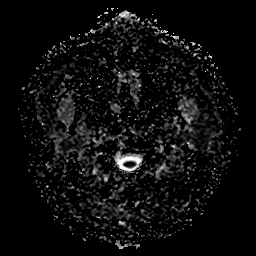
[im 25/50]
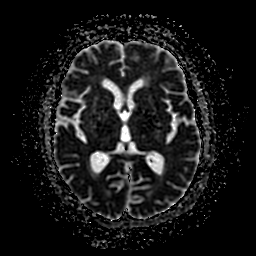
[im 50/50]
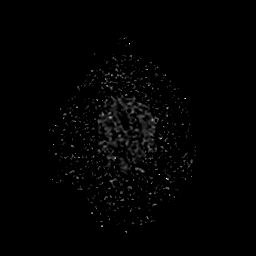

[Series 450: ADC · coronal · 4.0mm · 0.94mm/px · 2 of 36 slices shown (2 of 2)]
[im 1/36]
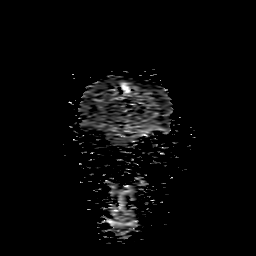
[im 36/36]
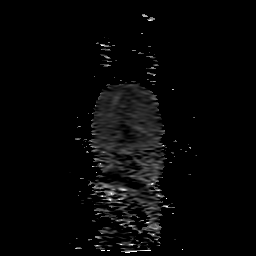

[29 of 48 positions shown; findings below may reference images not displayed]

FINDINGS: Brain: Partially hemorrhagic mass involving the medial left superior
frontal gyrus, and left cingulate gyrus. The mass encompasses 36 x
28 x 39 millimeters (AP by transverse by CC), is heterogeneously T2
hyperintense, and solidly enhancing. The medial margin of the mass
does abut the interhemispheric fissure, but the lesion appears
intra-axial on all sequences. There is a small volume of late
subacute appearing hemorrhage within the central aspect of the mass
(intrinsic T1 hyperintensity on series 14, image 37) with
surrounding hemosiderin. There is associated blood product diffusion
artifact. There is restricted diffusion along the peripheral margins
of the mass suggesting hyper cellularity. There is confluent
surrounding T2 and FLAIR hyperintensity in a pattern suggesting
vasogenic edema. There is mild regional mass effect, mostly on the
anterior body of the left lateral ventricle. There is trace local
rightward midline shift.

There is no larger area of restricted diffusion about the mass. No
diffusion restriction elsewhere.

No ventriculomegaly. No extra-axial or intraventricular blood.
Basilar cisterns are normal.

No other abnormal intracranial enhancement is identified. No
definite dural thickening. No other cerebral edema or mass effect.

Outside of the anterior left frontal lobe there is mild for age
symmetric bilateral nonspecific periventricular white matter T2 and
FLAIR hyperintensity. No cortical encephalomalacia or definite
chronic cerebral blood products.

There is mild for age T2 heterogeneity in the bilateral deep gray
matter nuclei. The brainstem and cerebellum appear normal.
Cervicomedullary junction and pituitary are within normal limits.

Vascular: Major intracranial vascular flow voids are preserved.

Skull and upper cervical spine: Advanced degenerative changes in the
visible cervical spine. Visualized bone marrow signal is within
normal limits. Negative visible cervical spinal cord aside from mild
degenerative stenosis.

Sinuses/Orbits: Negative.

Other: Visible internal auditory structures appear normal. Scalp and
face soft tissues appear negative.
IMPRESSION: 1. Solitary 3.9 cm intra-axial, enhancing, and partially hemorrhagic
mass of the medial left superior frontal gyrus affecting the
adjacent left cingulate gyrus. Surrounding vasogenic edema versus
nonenhancing tumor. Mild regional mass effect.
Top differential considerations are solitary metastasis and
high-grade glioma. CNS lymphoma is less likely.
2. No superimposed infarct or other abnormal brain enhancement.
3. Mild for age signal changes in the cerebral white matter and deep
gray matter compatible with chronic small vessel disease.

## 2018-10-24 IMAGING — CT CT CERVICAL SPINE W/O CM
3 of 4 series · 12 of 33 positions shown, 14 images · non-contrast
Comparison: None.

CLINICAL DATA: Seizure and fall out of bed.

EXAM:
CT HEAD WITHOUT CONTRAST
CT CERVICAL SPINE WITHOUT CONTRAST
TECHNIQUE: Multidetector CT imaging of the head and cervical spine was
performed following the standard protocol without intravenous
contrast. Multiplanar CT image reconstructions of the cervical spine
were also generated.

[Series 6: sag bone · sagittal · 0.26mm/px · 5 of 61 slices shown, 6 images]
[im 21/61  bone]
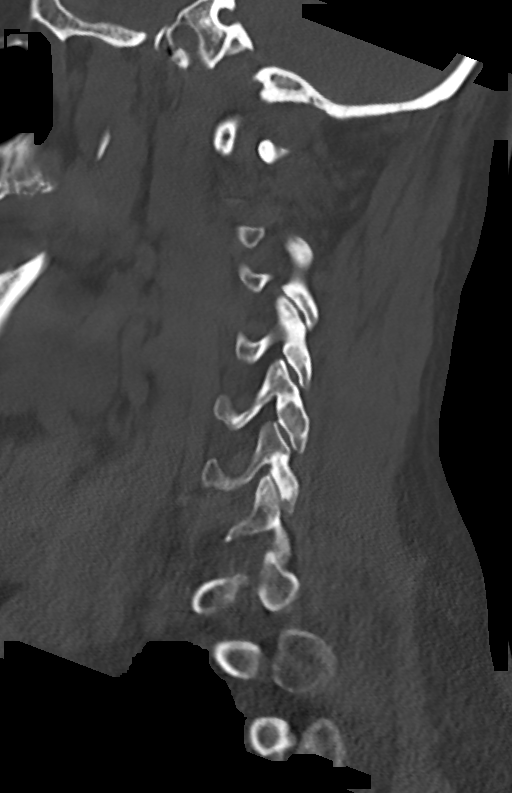
[im 26/61  bone]
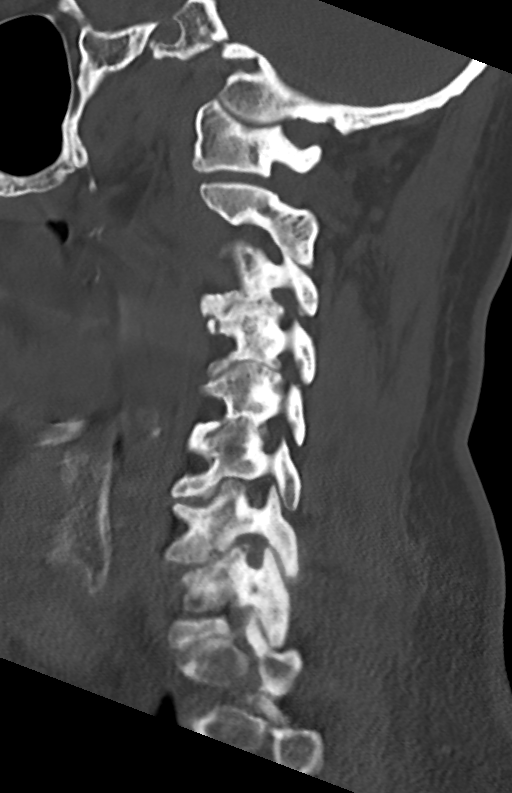
[im 31/61  soft-tissue]
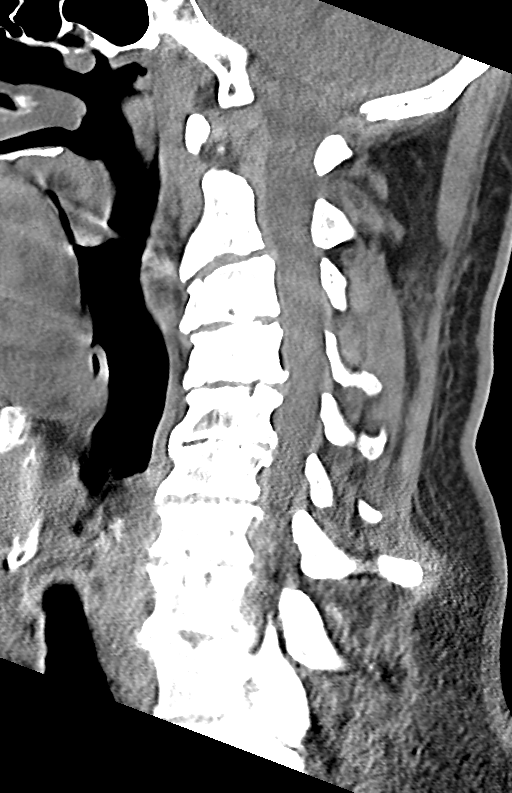
[im 31/61  bone]
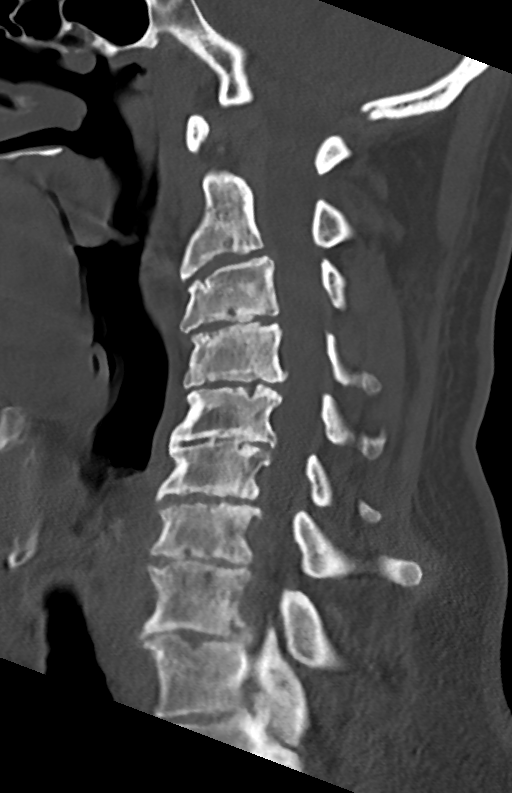
[im 36/61  bone]
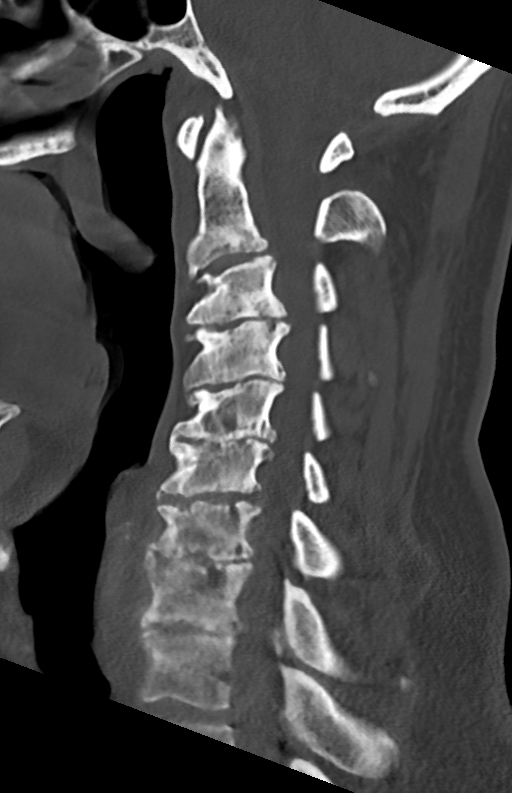
[im 41/61  bone]
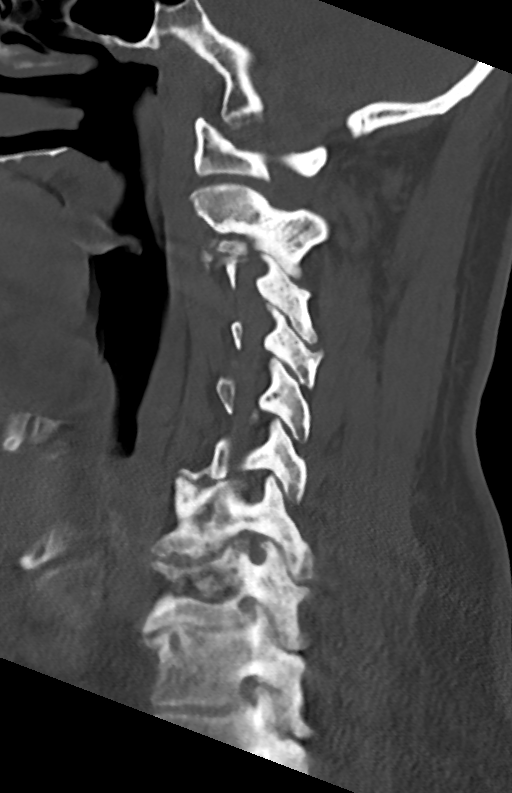

[Series 7: cor bone · coronal · 0.27mm/px · 3 of 61 slices shown]
[im 13/61  bone]
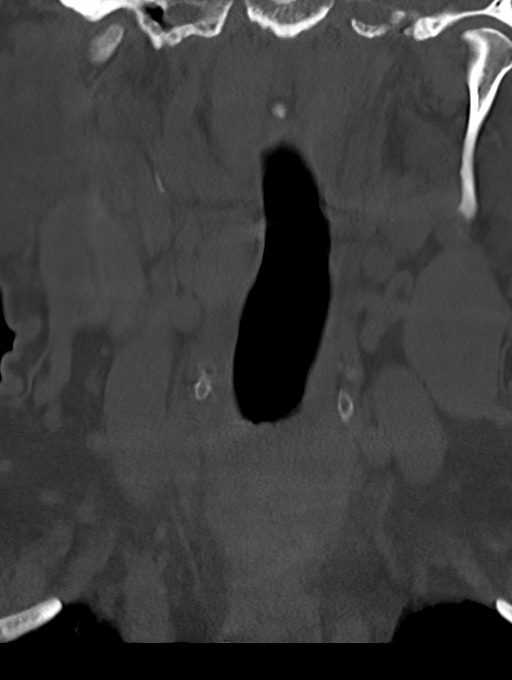
[im 25/61  bone]
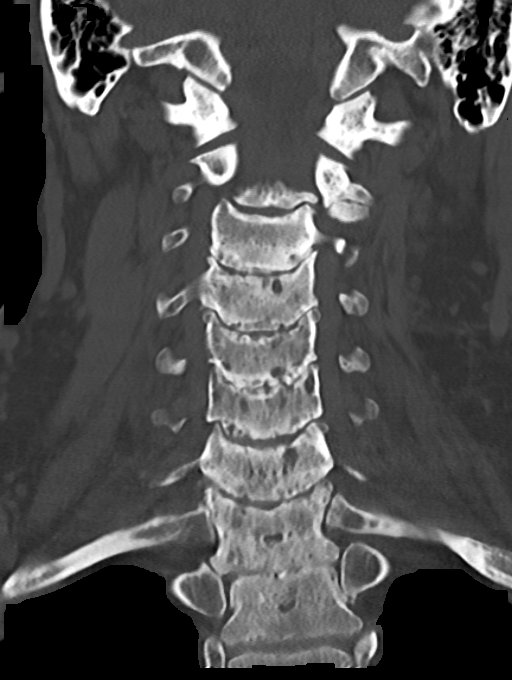
[im 37/61  bone]
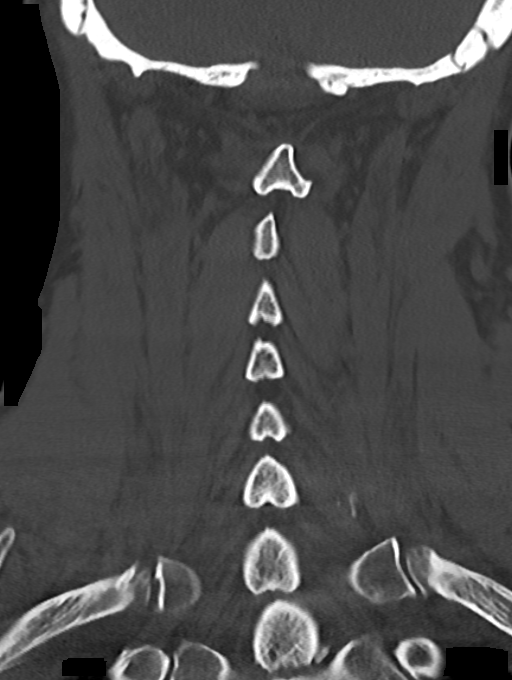

[Series 8: orthogonal axials · axial · 0.24mm/px · z∈[-309,-202]mm · 4 of 88 slices shown, 5 images]
[im 15/88  soft-tissue]
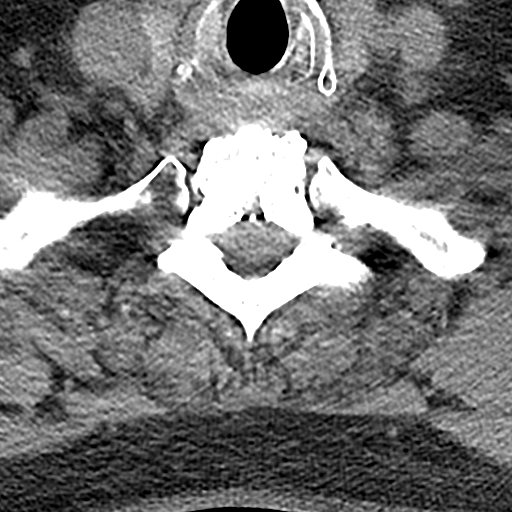
[im 15/88  bone]
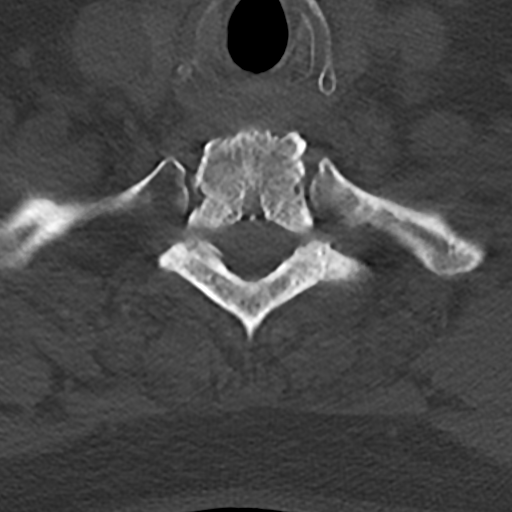
[im 30/88  bone]
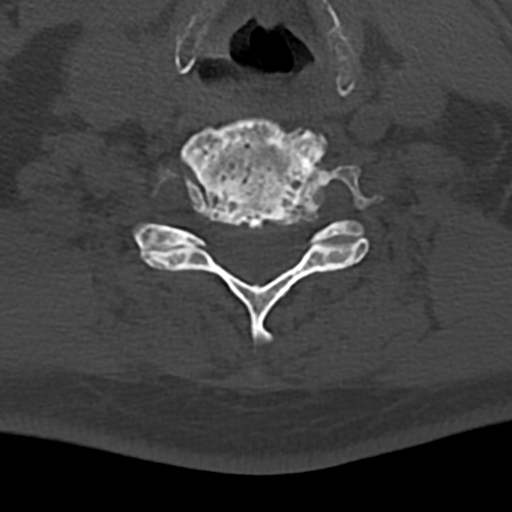
[im 59/88  bone]
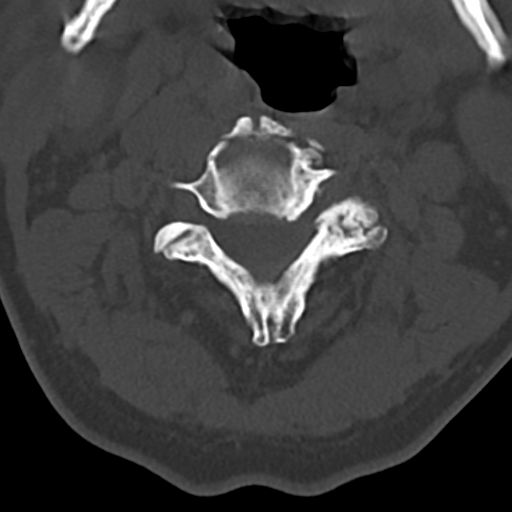
[im 73/88  bone]
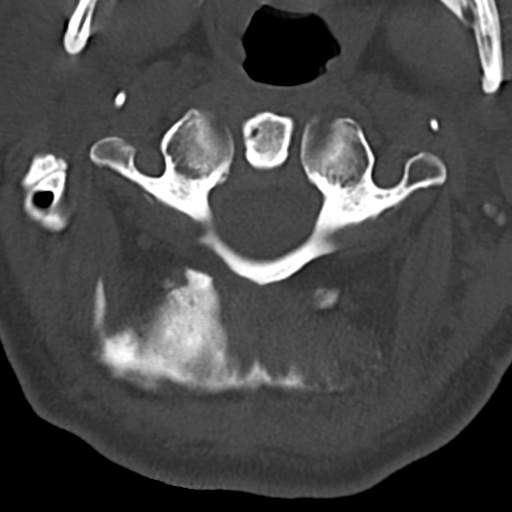

[12 of 33 positions shown; findings below may reference images not displayed]

FINDINGS: CT HEAD FINDINGS

Brain: There is a possible 3.0 cm mass in the left frontal lobe with
prominent surrounding vasogenic edema. Gray-white matter
differentiation is preserved. Small amount of hyperdensity along the
superior aspect of the mass may reflect hemorrhage. There is slight
rightward shift of the anterior falx and effacement of the left
lateral ventricle frontal horn. No hydrocephalus or extra-axial
collection.

Vascular: Atherosclerotic vascular calcification of the carotid
siphons. No hyperdense vessel.

Skull: Normal. Negative for fracture or focal lesion.

Sinuses/Orbits: No acute finding.

Other: None.

CT CERVICAL SPINE FINDINGS

Alignment: Reversal of the normal cervical lordosis. No traumatic
malalignment.

Skull base and vertebrae: No acute fracture. No primary bone lesion
or focal pathologic process.

Soft tissues and spinal canal: No prevertebral fluid or swelling. No
visible canal hematoma.

Disc levels: Moderate to severe degenerative disc disease and facet
uncovertebral hypertrophy throughout the cervical spine. Multilevel
moderate to severe neuroforaminal stenoses.

Upper chest: Negative.

Other: None.
IMPRESSION: 1. Probable 3.0 cm mass in the left frontal lobe with prominent
surrounding vasogenic edema. Small amount of hyperdensity along the
superior aspect of the mass likely reflects a hemorrhagic component.
Findings less likely represent hemorrhagic infarct given relative
preservation of the gray-white differentiation. Recommend
contrast-enhanced MRI of the brain for further evaluation.
2. No acute cervical spine fracture. Moderate to severe degenerative
changes throughout the cervical spine.

Critical Value/emergent results were called by telephone at the time
of interpretation on 09/25/2017 at [DATE] to Dr. MADGE CAMARENA ,
who verbally acknowledged these results.

## 2018-10-24 MED ORDER — TEMOZOLOMIDE 20 MG PO CAPS: 40.0000 mg | ORAL_CAPSULE | Freq: Every day | ORAL | 0 refills | Status: DC

## 2018-10-24 MED ORDER — TEMOZOLOMIDE 140 MG PO CAPS: 280.0000 mg | ORAL_CAPSULE | Freq: Every day | ORAL | 0 refills | Status: DC

## 2018-10-24 NOTE — Telephone Encounter (Signed)
Faxed Rx

## 2018-10-24 NOTE — Telephone Encounter (Signed)
Patients daughter states they still haven't received the Rx from [facility] for Temodar.    Per Margy Clarks, we need to print and manually fax to them.      Will need to adjust upcoming appt dates due delay in getting medication.

## 2018-10-26 IMAGING — CT CT HEAD W/O CM
1 series · 15 of 30 positions shown, 19 images · non-contrast
Comparison: CT and MRI 09/25/2017

CLINICAL DATA: First-time seizure. Intracranial mass lesion. [REDACTED] protocol prior to resection.

EXAM:
CT HEAD WITHOUT CONTRAST
TECHNIQUE: Contiguous axial images were obtained from the base of the skull
through the vertex without intravenous contrast.

[Series 3: axial standard · axial · 0.49mm/px · z∈[-576,-418]mm · 15 of 170 slices shown, 19 images]
[im 6/170  brain]
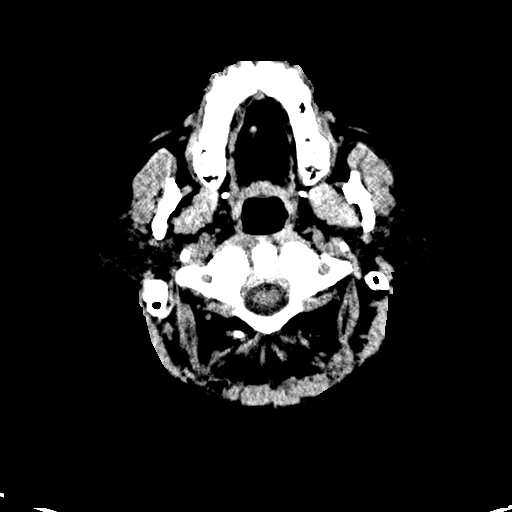
[im 6/170  bone]
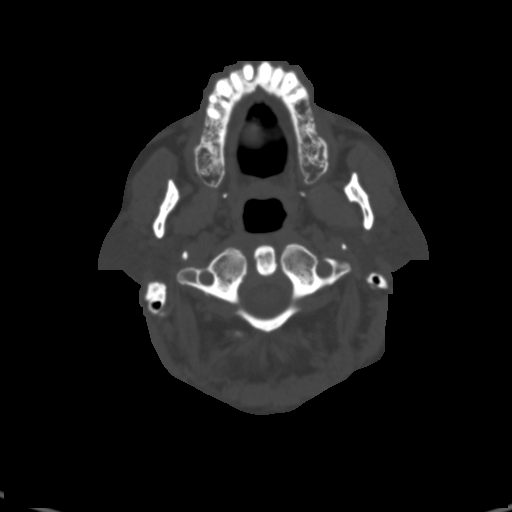
[im 18/170  brain]
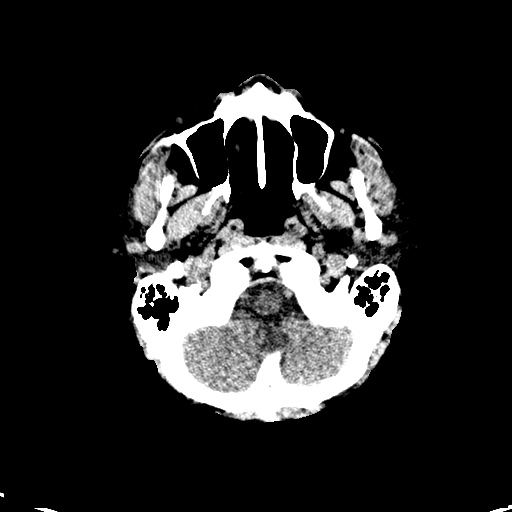
[im 30/170  brain]
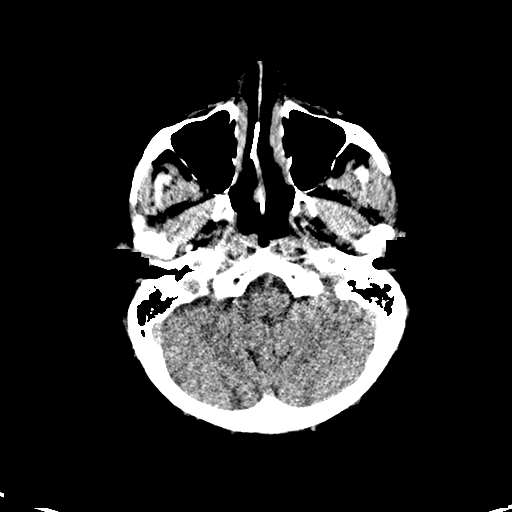
[im 41/170  brain]
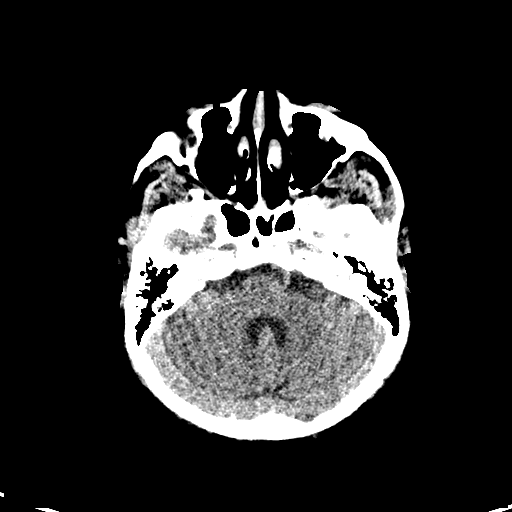
[im 53/170  brain]
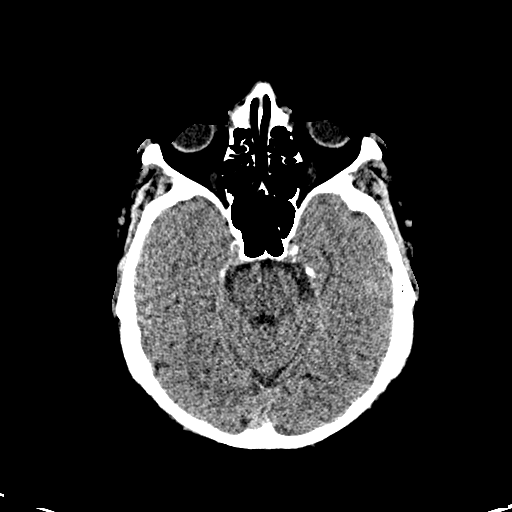
[im 53/170  bone]
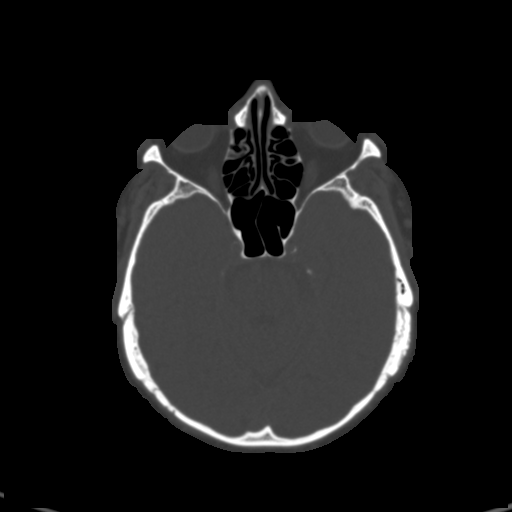
[im 65/170  brain]
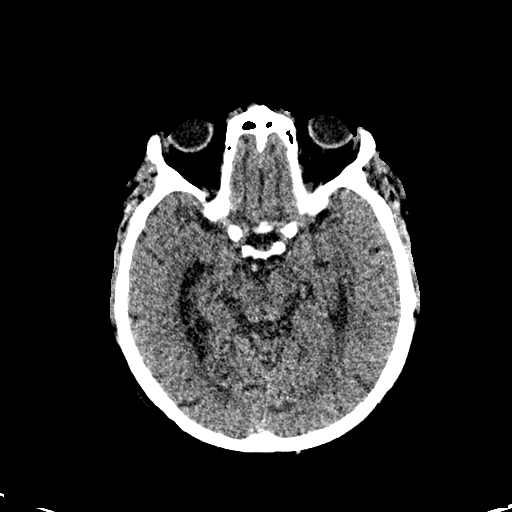
[im 76/170  brain]
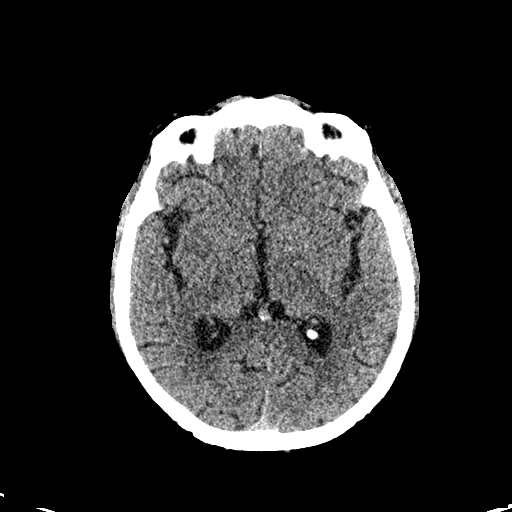
[im 88/170  brain]
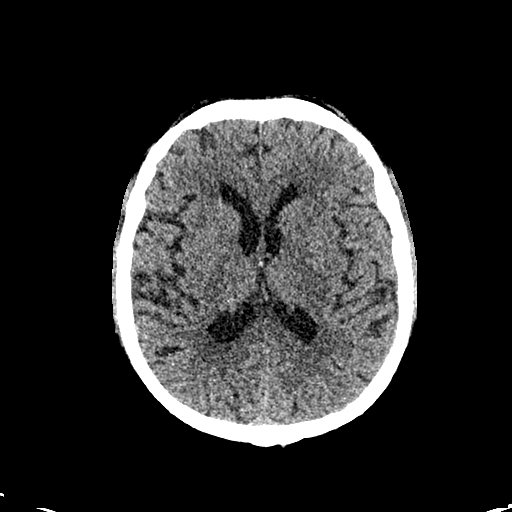
[im 94/170  brain]
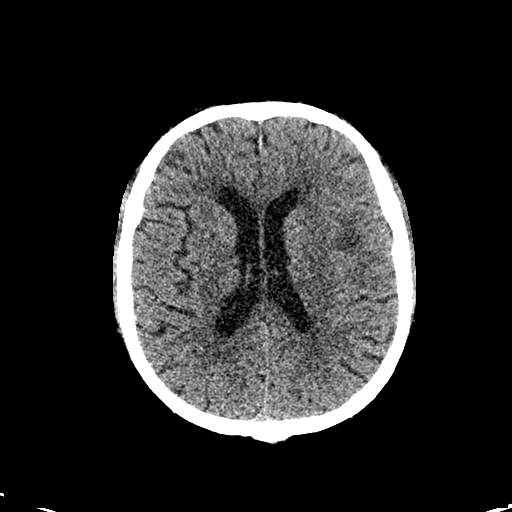
[im 94/170  bone]
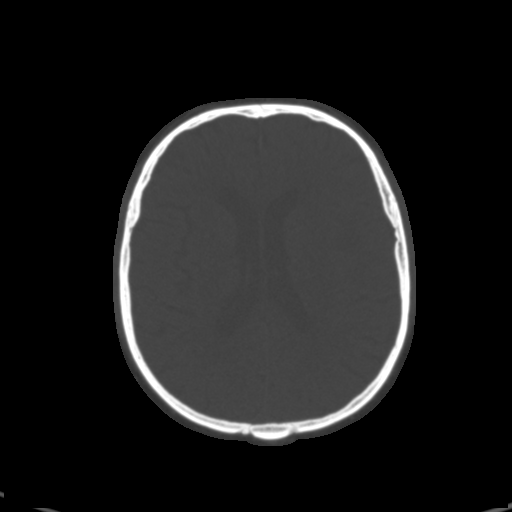
[im 105/170  brain]
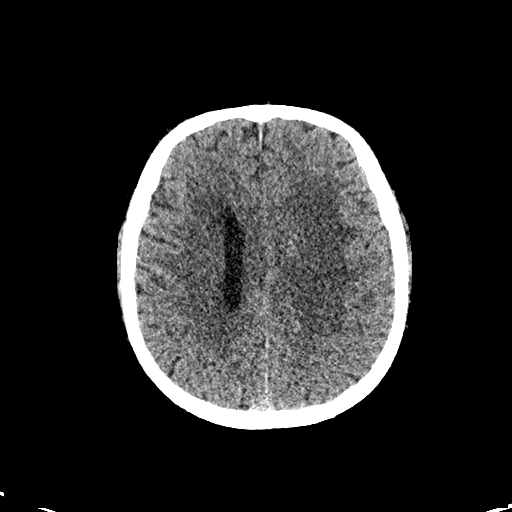
[im 117/170  brain]
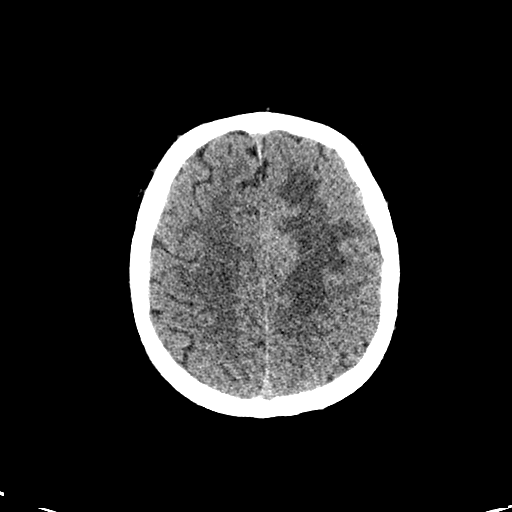
[im 129/170  brain]
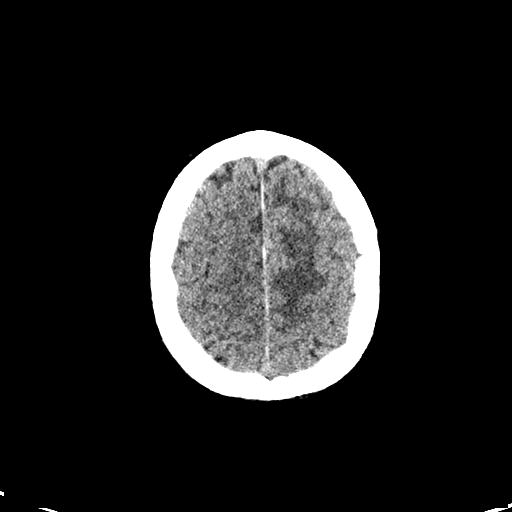
[im 140/170  brain]
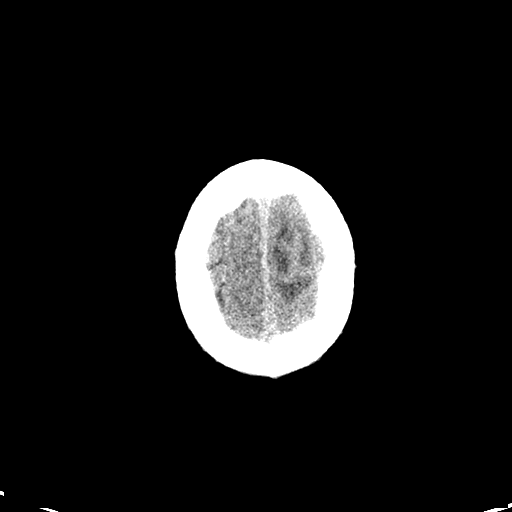
[im 140/170  bone]
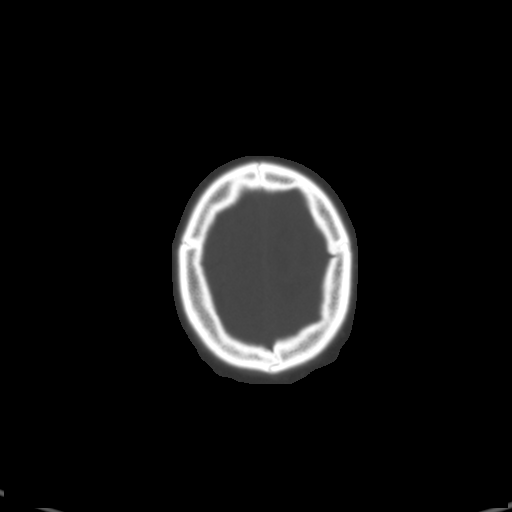
[im 152/170  brain]
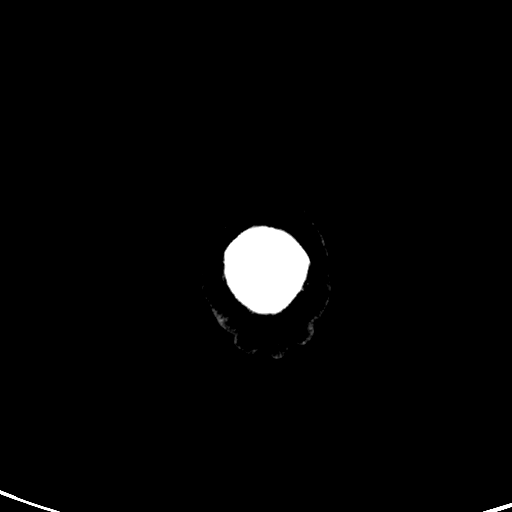
[im 164/170  brain]
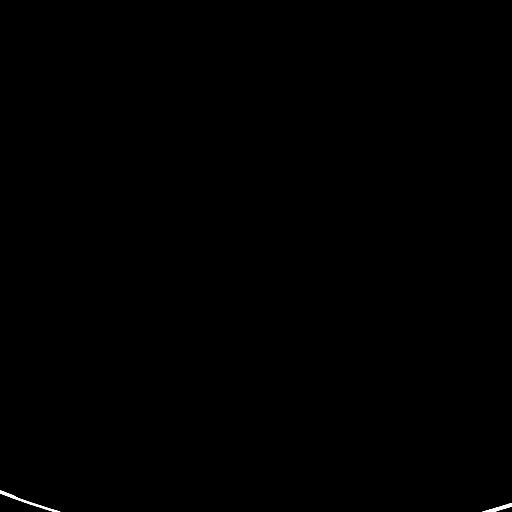

[15 of 30 positions shown; findings below may reference images not displayed]

FINDINGS: Brain: [facility] protocol for operative utilization. Redemonstration
of a necrotic mass in the medial left frontal vertex region with
transverse diameter of 3.5 cm and cephalo caudal extent of 3.5-4 cm.
Vasogenic edema in the left frontal lobe as seen previously. Small
focus of hemorrhage along the superior margin of the tumor. No
evidence of increased bleeding. No second lesion is evident. Mass
effect results an left-to-right shift of 2 mm. No hydrocephalus.

Vascular: There is atherosclerotic calcification of the major
vessels at the base of the brain.

Skull: Negative

Sinuses/Orbits: Clear/normal

Other: None
IMPRESSION: [facility] protocol for operative utilization. Necrotic left posterior
frontal vertex mass 3.5-4 cm in diameter as seen previously. Edema
pattern is the same. Small amount of hemorrhage within the superior
brim of the tumor is the same. Mass-effect is the same, with
left-to-right shift of 2 mm.

## 2018-10-31 IMAGING — MR MR HEAD WO/W CM
10 of 13 series · 33 of 48 positions shown · IV contrast (multihance)
Comparison: Preoperative MRI of the brain 09/25/2017.

CLINICAL DATA: Tumor resection 09/30/2016.

EXAM:
MRI HEAD WITHOUT AND WITH CONTRAST
TECHNIQUE: Multiplanar, multiecho pulse sequences of the brain and surrounding
structures were obtained without and with intravenous contrast.
CONTRAST:  18mL MULTIHANCE GADOBENATE DIMEGLUMINE 529 MG/ML IV SOLN

[Series 3: DWI · axial · 3.0mm · 1.09mm/px · z∈[-22,+110]mm · 8 of 90 slices shown (1 of 4)]
[im 1/90]
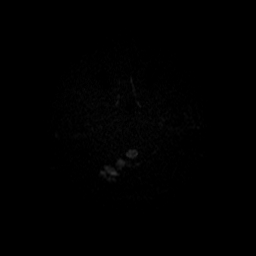
[im 13/90]
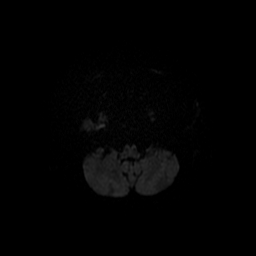
[im 26/90]
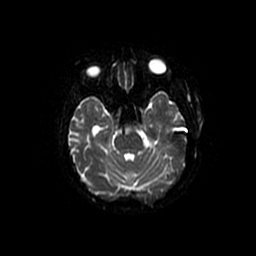
[im 39/90]
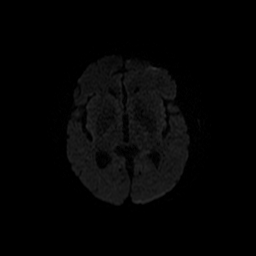
[im 51/90]
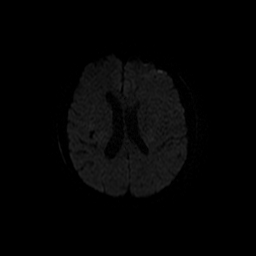
[im 64/90]
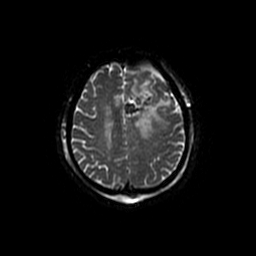
[im 77/90]
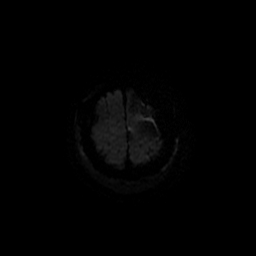
[im 90/90]
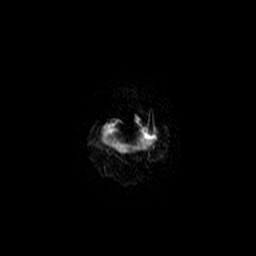

[Series 4: DWI · coronal · 5.0mm · 1.09mm/px · 6 of 66 slices shown (2 of 4)]
[im 1/66]
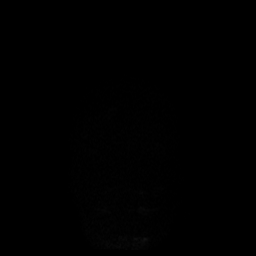
[im 14/66]
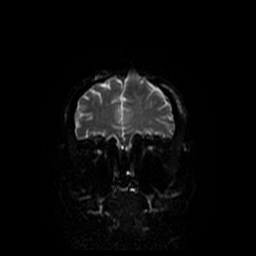
[im 27/66]
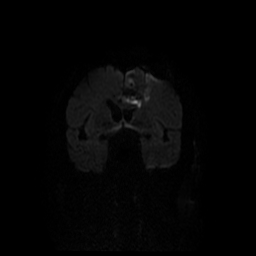
[im 40/66]
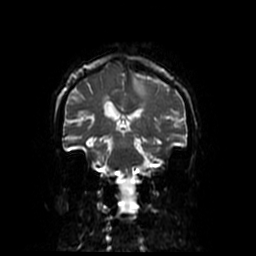
[im 53/66]
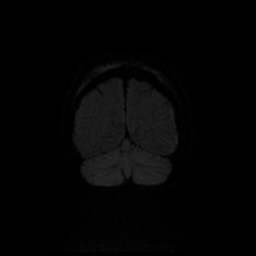
[im 66/66]
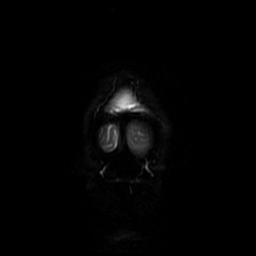

[Series 5: T1 · sagittal · 5.0mm · 0.47mm/px · 2 of 26 slices shown]
[im 1/26]
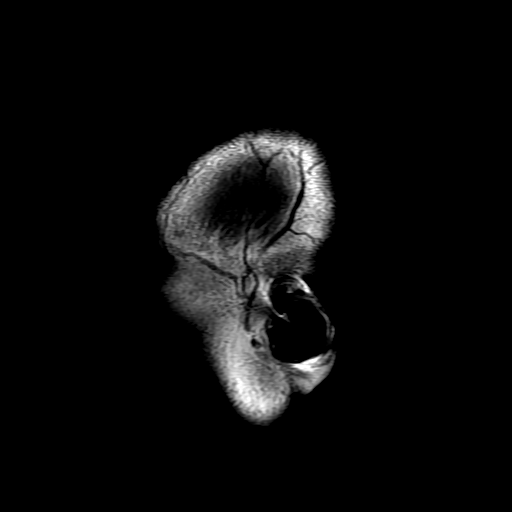
[im 26/26]
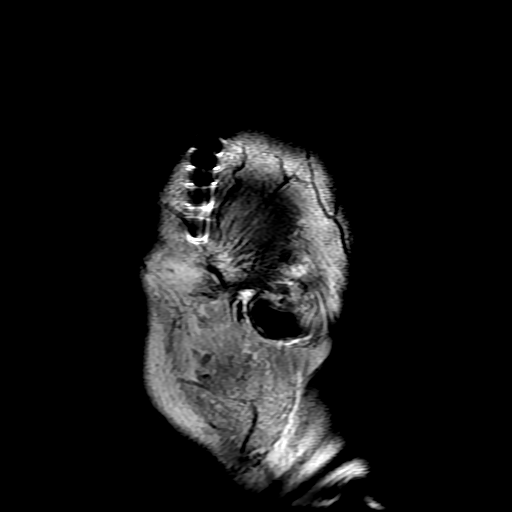

[Series 6: T2 · axial · 5.0mm · 0.45mm/px · z∈[-37,+99]mm · 2 of 24 slices shown]
[im 1/24]
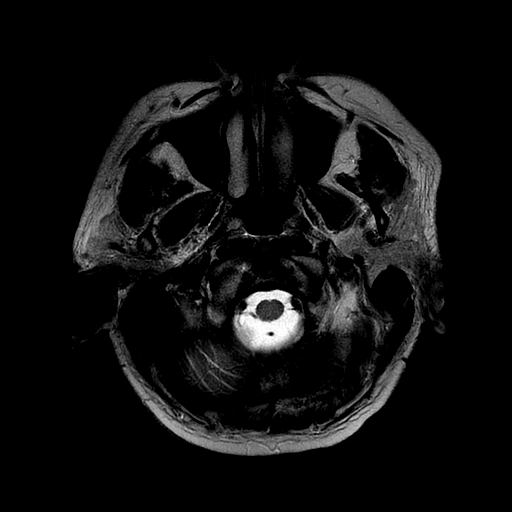
[im 24/24]
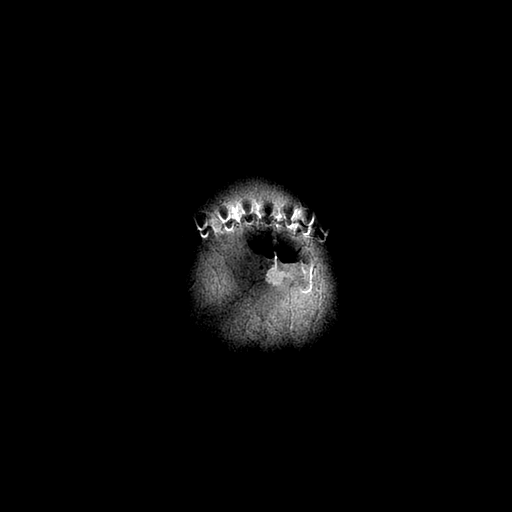

[Series 7: FLAIR · axial · 3.0mm · 0.43mm/px · z∈[-36,+100]mm · 2 of 24 slices shown]
[im 1/24]
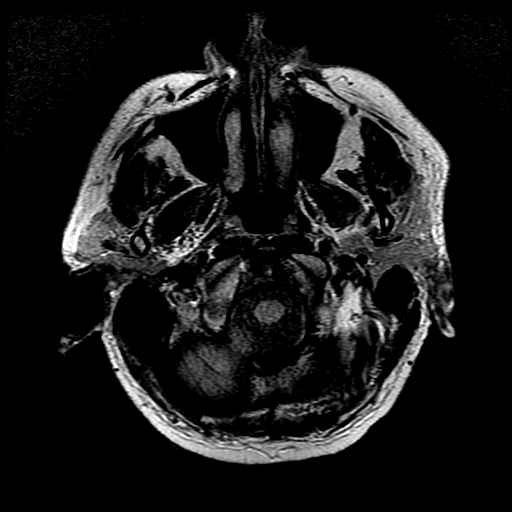
[im 24/24]
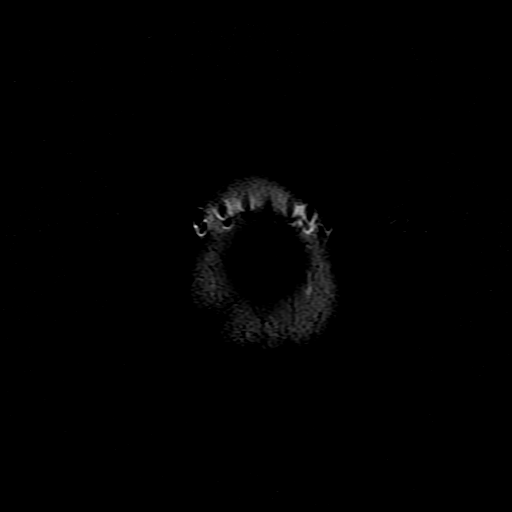

[Series 10: T2 post-contrast · coronal · 5.0mm · 0.39mm/px · 2 of 25 slices shown]
[im 1/25]
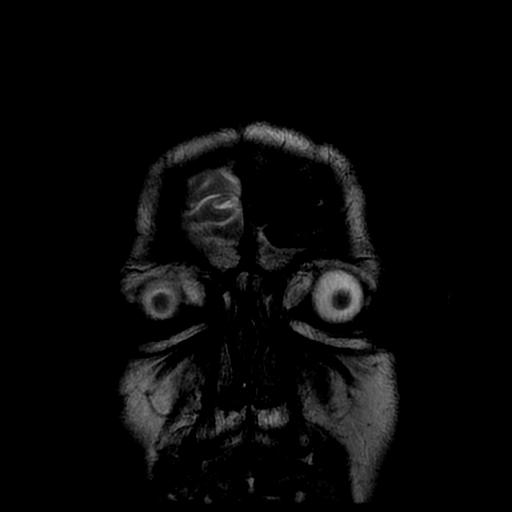
[im 25/25]
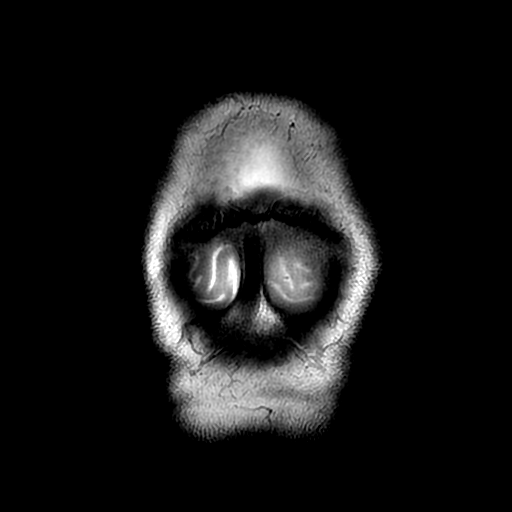

[Series 12: T1 post-contrast · coronal · 5.0mm · 0.39mm/px · 2 of 25 slices shown (1 of 2)]
[im 1/25]
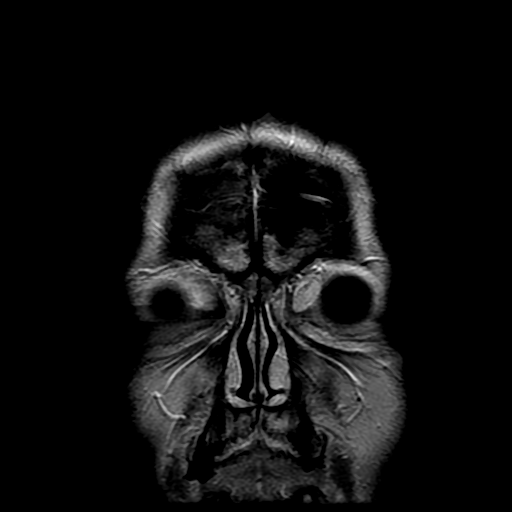
[im 25/25]
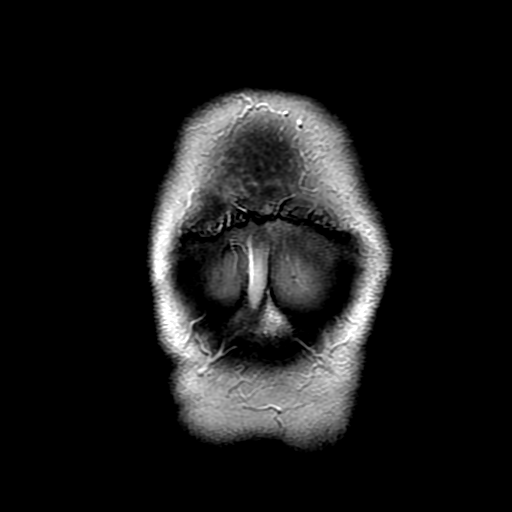

[Series 13: T1 post-contrast · sagittal · 5.0mm · 0.47mm/px · 2 of 26 slices shown (2 of 2)]
[im 1/26]
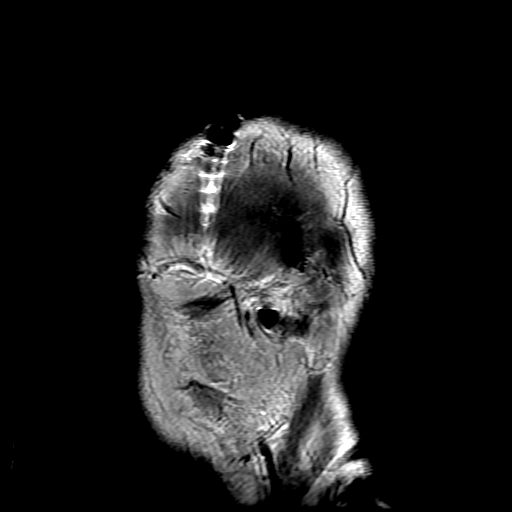
[im 26/26]
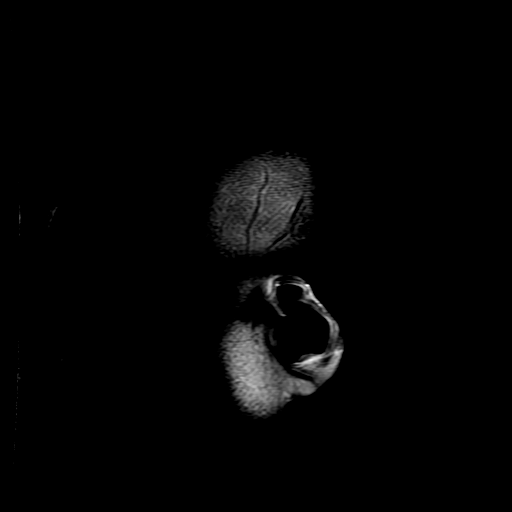

[Series 300: DWI · axial · 3.0mm · 1.09mm/px · z∈[-22,+110]mm · 4 of 45 slices shown (3 of 4)]
[im 1/45]
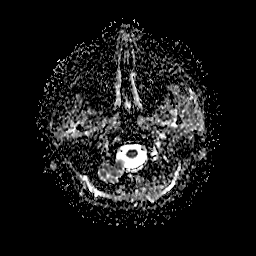
[im 15/45]
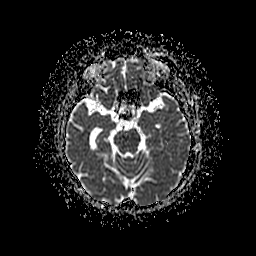
[im 30/45]
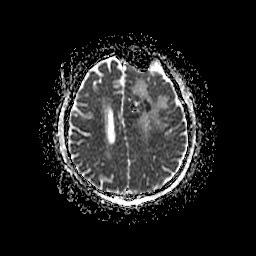
[im 45/45]
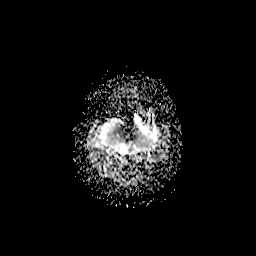

[Series 400: DWI · coronal · 5.0mm · 1.09mm/px · 3 of 33 slices shown (4 of 4)]
[im 1/33]
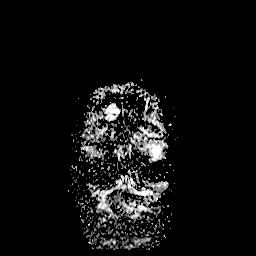
[im 17/33]
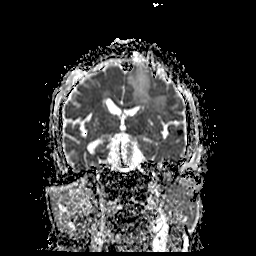
[im 33/33]
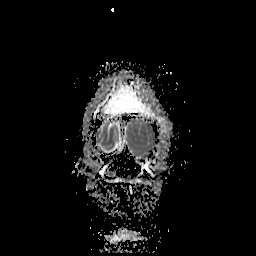

[33 of 48 positions shown; findings below may reference images not displayed]

FINDINGS: Brain: Left frontal craniotomy is noted. The bulk of the tumor has
been resected. There is some residual enhancement along the
posterior margin of the surgical cavity as well as the superomedial
and inferolateral margins. The deep margin demonstrates no
significant enhancement. Posteriorly the area of enhancement extends
obliquely 3.2 cm.

Blood products are evident within the surgical cavity. Extent of
surrounding vasogenic edema is not significantly changed. Mass
effect is significantly relieved. There is still some asymmetric
impact on the frontal horn of the left lateral ventricle. Sulcal
effacement over the convexity is relieved. Minimal midline shift
remains.

Mild atrophy and white matter disease is otherwise stable.

No distal enhancement is present. Expected enhancement along the
dura is noted following surgery.

Vascular: Flow is present in the major intracranial arteries.

Skull and upper cervical spine: The skull base is within normal
limits. Degenerative changes are noted in the upper cervical spine,
most evident at C3-4 were central canal stenosis is present.
Degenerative endplate changes are present. Marrow signal is
otherwise normal.

Sinuses/Orbits: The paranasal sinuses and mastoid air cells are
clear. Globes and orbits are within normal limits.
IMPRESSION: 1. Near complete resection of tumor in the left frontal lobe.
2. Rim of residual enhancement posteriorly along the margin of the
resection cavity with some enhancement superior medial and
inferolateral. The majority of the surgical cavity does not enhance,
suggesting some residual tumor posteriorly as described.
3. Surrounding vasogenic edema is stable with decreased mass effect.
4. Blood products are noted within the resection cavity.
5. Mild atrophy and white matter disease is otherwise stable. No
acute infarct is present.

## 2018-11-12 ENCOUNTER — Telehealth: Payer: Self-pay

## 2018-11-12 NOTE — Telephone Encounter (Signed)
Daughter [name] called and stated that her father has continued to decline. Is having increased diff walking with walker, dragging feet more, running into things, and she is concerned that he is going to fall. She stated he is talking less (one word statements), sleeping more, harder time getting up from chair, appetite still good. She is concerned. These concerns have been sent via message to Dr. Mickeal Skinner.

## 2018-11-13 ENCOUNTER — Ambulatory Visit: Payer: Non-veteran care | Admitting: Internal Medicine

## 2018-11-13 ENCOUNTER — Other Ambulatory Visit: Payer: Self-pay | Admitting: Internal Medicine

## 2018-11-13 ENCOUNTER — Other Ambulatory Visit: Payer: Non-veteran care

## 2018-11-13 ENCOUNTER — Other Ambulatory Visit: Payer: Self-pay | Admitting: *Deleted

## 2018-11-13 DIAGNOSIS — C711 Malignant neoplasm of frontal lobe: Secondary | ICD-10-CM

## 2018-11-17 ENCOUNTER — Telehealth: Payer: Self-pay | Admitting: Internal Medicine

## 2018-11-17 ENCOUNTER — Other Ambulatory Visit: Payer: Self-pay

## 2018-11-17 ENCOUNTER — Encounter: Payer: Self-pay | Admitting: Internal Medicine

## 2018-11-17 ENCOUNTER — Inpatient Hospital Stay: Payer: Medicare Other | Attending: Internal Medicine | Admitting: Internal Medicine

## 2018-11-17 ENCOUNTER — Ambulatory Visit (HOSPITAL_COMMUNITY)
Admission: RE | Admit: 2018-11-17 | Discharge: 2018-11-17 | Disposition: A | Discharge status: Home / Self Care | Payer: Medicare Other | Source: Ambulatory Visit | Attending: Internal Medicine | Admitting: Internal Medicine

## 2018-11-17 VITALS — BP 147/73 | HR 93 | Temp 98.4°F | Resp 18 | Ht 76.0 in | Wt 203.7 lb

## 2018-11-17 DIAGNOSIS — R569 Unspecified convulsions: Secondary | ICD-10-CM

## 2018-11-17 DIAGNOSIS — C711 Malignant neoplasm of frontal lobe: Secondary | ICD-10-CM

## 2018-11-17 DIAGNOSIS — C719 Malignant neoplasm of brain, unspecified: Secondary | ICD-10-CM

## 2018-11-17 DIAGNOSIS — R5383 Other fatigue: Secondary | ICD-10-CM | POA: Diagnosis not present

## 2018-11-17 MED ORDER — DEXAMETHASONE 4 MG PO TABS: 4.0000 mg | ORAL_TABLET | Freq: Two times a day (BID) | ORAL | 0 refills | Status: AC

## 2018-11-17 MED ORDER — GADOBUTROL 1 MMOL/ML IV SOLN
10.0000 mL | Freq: Once | INTRAVENOUS | Status: AC | PRN
  Administered 2018-11-17: 10 mL via INTRAVENOUS

## 2018-11-17 MED ORDER — DEXAMETHASONE 4 MG PO TABS: 4.0000 mg | ORAL_TABLET | Freq: Two times a day (BID) | ORAL | 0 refills | Status: DC

## 2018-11-17 NOTE — Addendum Note (Signed)
Addended by: Ventura Sellers on: 11/17/2018 04:18 PM   Modules accepted: Orders

## 2018-11-17 NOTE — Progress Notes (Signed)
[facility] [address] [place], [place] [address] [phone]   Interval Evaluation  Date of Service: 11/17/18 Patient Name: Christopher Morris Patient MRN: [id number] Patient DOB: 10-09-1940 Provider: Ventura Sellers, MD  Identifying Statement:  Dinnis Rog is a 78 y.o. male with left frontal glioblastoma    Oncologic History:   Frontal glioblastoma multiforme ([place])   09/30/2017 Surgery    Craniotomy, resection by Dr. Sherwood Gambler.  Path demonstrates glioblastoma    10/28/2017 -  Radiation Therapy    IMRT with concurrent Temodar    01/07/2018 -  Chemotherapy    The patient had [No matching medication found in this treatment plan]  for chemotherapy treatment.      Biomarkers:  MGMT Methylated.  IDH 1/2 Unknown.  EGFR Amplified  EGFRvIII positive   Interval History:  Ketih Goodie presents today for follow up after completing 9th cycle of adjuvant Temodar.  He and his daughter describe relative decline this past month.  He has had increaesed fatigue, been less active physically.  Daughter feels his fluency and responsiveness have waned slightly.  Continues to walk with his walker, but also ambulates independently at home. Otherwise denies seizures, headaches.    Medications: Current Outpatient Medications on File Prior to Visit  Medication Sig Dispense Refill   apixaban (ELIQUIS) 5 MG TABS tablet Take 10 mg BID till 12/24/2017, start taking 5 mg BID from 12/25/2017 60 tablet 0   levETIRAcetam (KEPPRA) 1000 MG tablet Take 1 tablet (1,000 mg total) by mouth 2 (two) times daily. 30 tablet 1   ondansetron (ZOFRAN) 8 MG tablet Take 1 tablet (8 mg total) by mouth 2 (two) times daily as needed. Start on the third day after chemotherapy. (Patient not taking: Reported on 07/31/2018) 30 tablet 1   temozolomide (TEMODAR) 140 MG capsule Take 2 capsules (280 mg total) by mouth daily. May take on an empty stomach to decrease nausea & vomiting.  (Patient not taking: Reported on 07/31/2018) 10 capsule 0   temozolomide (TEMODAR) 140 MG capsule Take 2 capsules (280 mg total) by mouth daily. May take on an empty stomach to decrease nausea & vomiting. (Patient not taking: Reported on 07/31/2018) 10 capsule 0   temozolomide (TEMODAR) 140 MG capsule Take 2 capsules (280 mg total) by mouth daily. May take on an empty stomach to decrease nausea & vomiting. (Patient not taking: Reported on 07/31/2018) 10 capsule 0   temozolomide (TEMODAR) 140 MG capsule Take 2 capsules (280 mg total) by mouth daily. May take on an empty stomach to decrease nausea & vomiting. (Patient not taking: Reported on 07/31/2018) 10 capsule 0   temozolomide (TEMODAR) 140 MG capsule Take 2 capsules (280 mg total) by mouth daily. May take on an empty stomach to decrease nausea & vomiting. (Patient not taking: Reported on 08/28/2018) 10 capsule 0   temozolomide (TEMODAR) 140 MG capsule Take 2 capsules (280 mg total) by mouth daily. May take on an empty stomach to decrease nausea & vomiting. (Patient not taking: Reported on 11/17/2018) 10 capsule 0   temozolomide (TEMODAR) 140 MG capsule Take 2 capsules (280 mg total) by mouth daily. May take on an empty stomach to decrease nausea & vomiting. (Patient not taking: Reported on 11/17/2018) 10 capsule 0   temozolomide (TEMODAR) 140 MG capsule Take 2 capsules (280 mg total) by mouth daily. May take on an empty stomach to decrease nausea & vomiting. (Patient not taking: Reported on 11/17/2018) 10 capsule 0  temozolomide (TEMODAR) 140 MG capsule Take 2 capsules (280 mg total) by mouth daily. May take on an empty stomach to decrease nausea & vomiting. (Patient not taking: Reported on 11/17/2018) 10 capsule 0   temozolomide (TEMODAR) 20 MG capsule Take 2 capsules (40 mg total) by mouth daily. May take on an empty stomach to decrease nausea & vomiting. (Patient not taking: Reported on 07/31/2018) 10 capsule 0   temozolomide (TEMODAR) 20 MG capsule  Take 2 capsules (40 mg total) by mouth daily. May take on an empty stomach to decrease nausea & vomiting. (Patient not taking: Reported on 07/31/2018) 10 capsule 0   temozolomide (TEMODAR) 20 MG capsule Take 2 capsules (40 mg total) by mouth daily. May take on an empty stomach to decrease nausea & vomiting. (Patient not taking: Reported on 07/31/2018) 10 capsule 0   temozolomide (TEMODAR) 20 MG capsule Take 2 capsules (40 mg total) by mouth daily. May take on an empty stomach to decrease nausea & vomiting. (Patient not taking: Reported on 07/31/2018) 10 capsule 0   temozolomide (TEMODAR) 20 MG capsule Take 2 capsules (40 mg total) by mouth daily. May take on an empty stomach to decrease nausea & vomiting. (Patient not taking: Reported on 08/28/2018) 10 capsule 0   temozolomide (TEMODAR) 20 MG capsule Take 2 capsules (40 mg total) by mouth daily. May take on an empty stomach to decrease nausea & vomiting. (Patient not taking: Reported on 11/17/2018) 10 capsule 0   temozolomide (TEMODAR) 20 MG capsule Take 2 capsules (40 mg total) by mouth daily. May take on an empty stomach to decrease nausea & vomiting. (Patient not taking: Reported on 11/17/2018) 10 capsule 0   temozolomide (TEMODAR) 20 MG capsule Take 2 capsules (40 mg total) by mouth daily. May take on an empty stomach to decrease nausea & vomiting. (Patient not taking: Reported on 11/17/2018) 10 capsule 0   temozolomide (TEMODAR) 20 MG capsule Take 2 capsules (40 mg total) by mouth daily. May take on an empty stomach to decrease nausea & vomiting. (Patient not taking: Reported on 11/17/2018) 10 capsule 0   No current facility-administered medications on file prior to visit.     Allergies:  Allergies  Allergen Reactions   Pork-Derived Products     Patient does not eat pork   Shellfish Allergy     Patient does not eat shellfish   Past Medical History:  Past Medical History:  Diagnosis Date   Brain cancer ([place])    Glioblastoma ([place]) 09/25/2017    Hyperglycemia 09/25/2017   Seizure ([place]) 09/25/2017   Past Surgical History:  Past Surgical History:  Procedure Laterality Date   APPLICATION OF CRANIAL NAVIGATION N/A 09/30/2017   Procedure: APPLICATION OF CRANIAL NAVIGATION;  Surgeon: [name], MD;  Location: [facility];  Service: Neurosurgery;  Laterality: N/A;   CRANIOTOMY N/A 09/30/2017   Procedure: CRANIOTOMY FOR RESECTION OF BRAIN TUMOR WITH STEREOTACTIC;  Surgeon: [name], MD;  Location: [facility];  Service: Neurosurgery;  Laterality: N/A;  CRANIOTOMY FOR RESECTION OF BRAIN TUMOR WITH STEREOTACTIC   Social History:  Social History   Socioeconomic History   Marital status: Divorced    Spouse name: Not on file   Number of children: Not on file   Years of education: Not on file   Highest education level: Not on file  Occupational History   Not on file  Social Needs   Financial resource strain: Not on file   Food insecurity:    Worry: Not on file  Inability: Not on file   Transportation needs:    Medical: No    Non-medical: No  Tobacco Use   Smoking status: Never Smoker   Smokeless tobacco: Never Used  Substance and Sexual Activity   Alcohol use: No    Frequency: Never   Drug use: No   Sexual activity: Not Currently  Lifestyle   Physical activity:    Days per week: 5 days    Minutes per session: Not on file   Stress: Only a little  Relationships   Social connections:    Talks on phone: Not on file    Gets together: Not on file    Attends religious service: Not on file    Active member of club or organization: Not on file    Attends meetings of clubs or organizations: Not on file    Relationship status: Not on file   Intimate partner violence:    Fear of current or ex partner: Not on file    Emotionally abused: Not on file    Physically abused: Not on file    Forced sexual activity: Not on file  Other Topics Concern   Not on file  Social History Narrative   Not on file    Family History:  Family History  Problem Relation Age of Onset   Hypertension Daughter    Stroke Mother    Cancer Neg Hx     Review of Systems: Constitutional: Denies fevers, chills or abnormal weight loss Eyes: Denies blurriness of vision Ears, nose, mouth, throat, and face: Denies mucositis or sore throat Respiratory: Denies cough, dyspnea or wheezes Cardiovascular: Denies palpitation, chest discomfort or lower extremity swelling Gastrointestinal:  Denies nausea, constipation, diarrhea GU: Denies dysuria or incontinence Skin: Denies abnormal skin rashes Neurological: Per HPI Musculoskeletal: Denies joint pain, back or neck discomfort. No decrease in ROM Behavioral/Psych: Denies anxiety, disturbance in thought content, and mood instability  Physical Exam: Vitals:   11/17/18 1055  BP: (!) 147/73  Pulse: 93  Resp: 18  Temp: 98.4 F (36.9 C)  SpO2: 100%   KPS: 80. General: Alert, cooperative, pleasant, in no acute distress Head: Craniotomy scar noted, dry and intact. EENT: No conjunctival injection or scleral icterus. Oral mucosa moist Lungs: Resp effort normal Cardiac: Regular rate and rhythm Abdomen: Soft, non-distended abdomen Skin: No rashes cyanosis or petechiae. Extremities: No clubbing or edema  Neurologic Exam: Mental Status: Awake, alert, attentive to examiner. Oriented to self and environment. Language has mild impairment in fluency with intact comprehension and repetition.  Cranial Nerves: Visual acuity is grossly normal. Visual fields are full. Extra-ocular movements intact. No ptosis. Face is symmetric, tongue midline. Motor: Tone and bulk are normal. Pronator drift right arm, left side 5/5 power. Reflexes are symmetric, no pathologic reflexes present. Intact finger to nose bilaterally Sensory: Intact to light touch and temperature Gait: Independent but purposeful and wide based   Labs: I have reviewed the data as listed    Component Value  Date/Time   NA 141 10/08/2018 1137   NA 136 (A) 10/18/2017   K 4.5 10/08/2018 1137   CL 105 10/08/2018 1137   CO2 27 10/08/2018 1137   GLUCOSE 102 (H) 10/08/2018 1137   BUN 8 10/08/2018 1137   BUN 19 10/18/2017   CREATININE 0.95 10/08/2018 1137   CALCIUM 9.5 10/08/2018 1137   PROT 7.4 10/08/2018 1137   ALBUMIN 4.2 10/08/2018 1137   AST 13 (L) 10/08/2018 1137   ALT 11 10/08/2018 1137  ALKPHOS 80 10/08/2018 1137   BILITOT 1.2 10/08/2018 1137   GFRNONAA >60 10/08/2018 1137   GFRAA >60 10/08/2018 1137   Lab Results  Component Value Date   WBC 6.6 10/08/2018   NEUTROABS 2.9 10/08/2018   HGB 14.1 10/08/2018   HCT 43.2 10/08/2018   MCV 89.6 10/08/2018   PLT 216 10/08/2018   Imaging:  [facility] Clinician Interpretation: I have personally reviewed the CNS images as listed.  My interpretation, in the context of the patient's clinical presentation, is stable disease  [name] Wo Contrast  Result Date: 11/17/2018 CLINICAL DATA:  Follow-up glioblastoma EXAM: MRI HEAD WITHOUT AND WITH CONTRAST TECHNIQUE: Multiplanar, multiecho pulse sequences of the brain and surrounding structures were obtained without and with intravenous contrast. CONTRAST:  10 cc Gadavist intravenous COMPARISON:  10/03/2018 FINDINGS: Brain: Left frontal glioma status post resection. Irregular enhancement at the level of the resection cavity encompassing the superior margin of the left lateral ventricle. The extent of enhancement is unchanged, measuring up to 3.5 cm maximum on axial slices. This area includes a smaller area (up to 12 mm) of restricted diffusion and T2 hypointensity, this area particularly concerning for enhancing tumor. There is extensive FLAIR hyperintensity in the left more than right cerebrum with mild increase in extent at the level of the left frontal operculum. There is effacement of sulci on the left more consistent with edema and or nonenhancing tumor than small-vessel changes from treatment. The left  lateral ventricle is partially effaced compared to the right, subtly increased from before. No infarct, hydrocephalus, or shift Vascular: Major flow voids are preserved Skull and upper cervical spine: Unremarkable craniotomy changes Sinuses/Orbits: Negative IMPRESSION: 1. Left frontal glioblastoma with unchanged enhancing portion. There is a smaller area within this enhancement that is T2 dark and restricts diffusion, this area in particular is suspicious for enhancing disease. 2. Extensive T2 signal in the left more than right cerebrum with mild increase extent and associated mass effect on the left. Electronically Signed   By: Monte Fantasia M.D.   On: 11/17/2018 10:27    Assessment/Plan 1. Glioblastoma ([place])  2. Seizure [facility])  Mr. Souder has modest clinical changes that are more subjective than objective today.  Tumor bed and enhancement are stable from previous scans.  Noted is a continued gradual increase in volume of T2 signal abnormality (primarily periventricular) without associated increased mass effect.  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      We recommended withholding further temozlomide, given he has completed 9 cycles.  Given symptomatology we have requested serum for analysis; CBC, CMP, B12/folate, Vit D, coritsol and testosterone.   He should continue Keppra and Eliquis at established doses.  We recommend he return in 2 months with an MRI brain for evaluation, or sooner as needed.  All questions were answered. The patient knows to call the clinic with any problems, questions or concerns. No barriers to learning were detected.  The total time spent in the encounter was 40 minutes and more than 50% was on counseling and review of test results   Ventura Sellers, MD Medical Director of Neuro-Oncology [facility] 11/17/18 11:24 AM

## 2018-11-17 NOTE — Telephone Encounter (Signed)
A lab for 2 weeks was already scheduled.  Per 5/4 los, scheduled 9 week f/u.

## 2018-11-18 ENCOUNTER — Other Ambulatory Visit: Payer: Self-pay | Admitting: Internal Medicine

## 2018-11-20 ENCOUNTER — Other Ambulatory Visit: Payer: Self-pay | Admitting: *Deleted

## 2018-11-20 DIAGNOSIS — C719 Malignant neoplasm of brain, unspecified: Secondary | ICD-10-CM

## 2018-11-23 IMAGING — CR DG CHEST 2V
2 series · 2 of 2 positions shown · non-contrast
Comparison: 09/25/2017

CLINICAL DATA: Altered mental status

EXAM:
CHEST - 2 VIEW

[x chest ap]
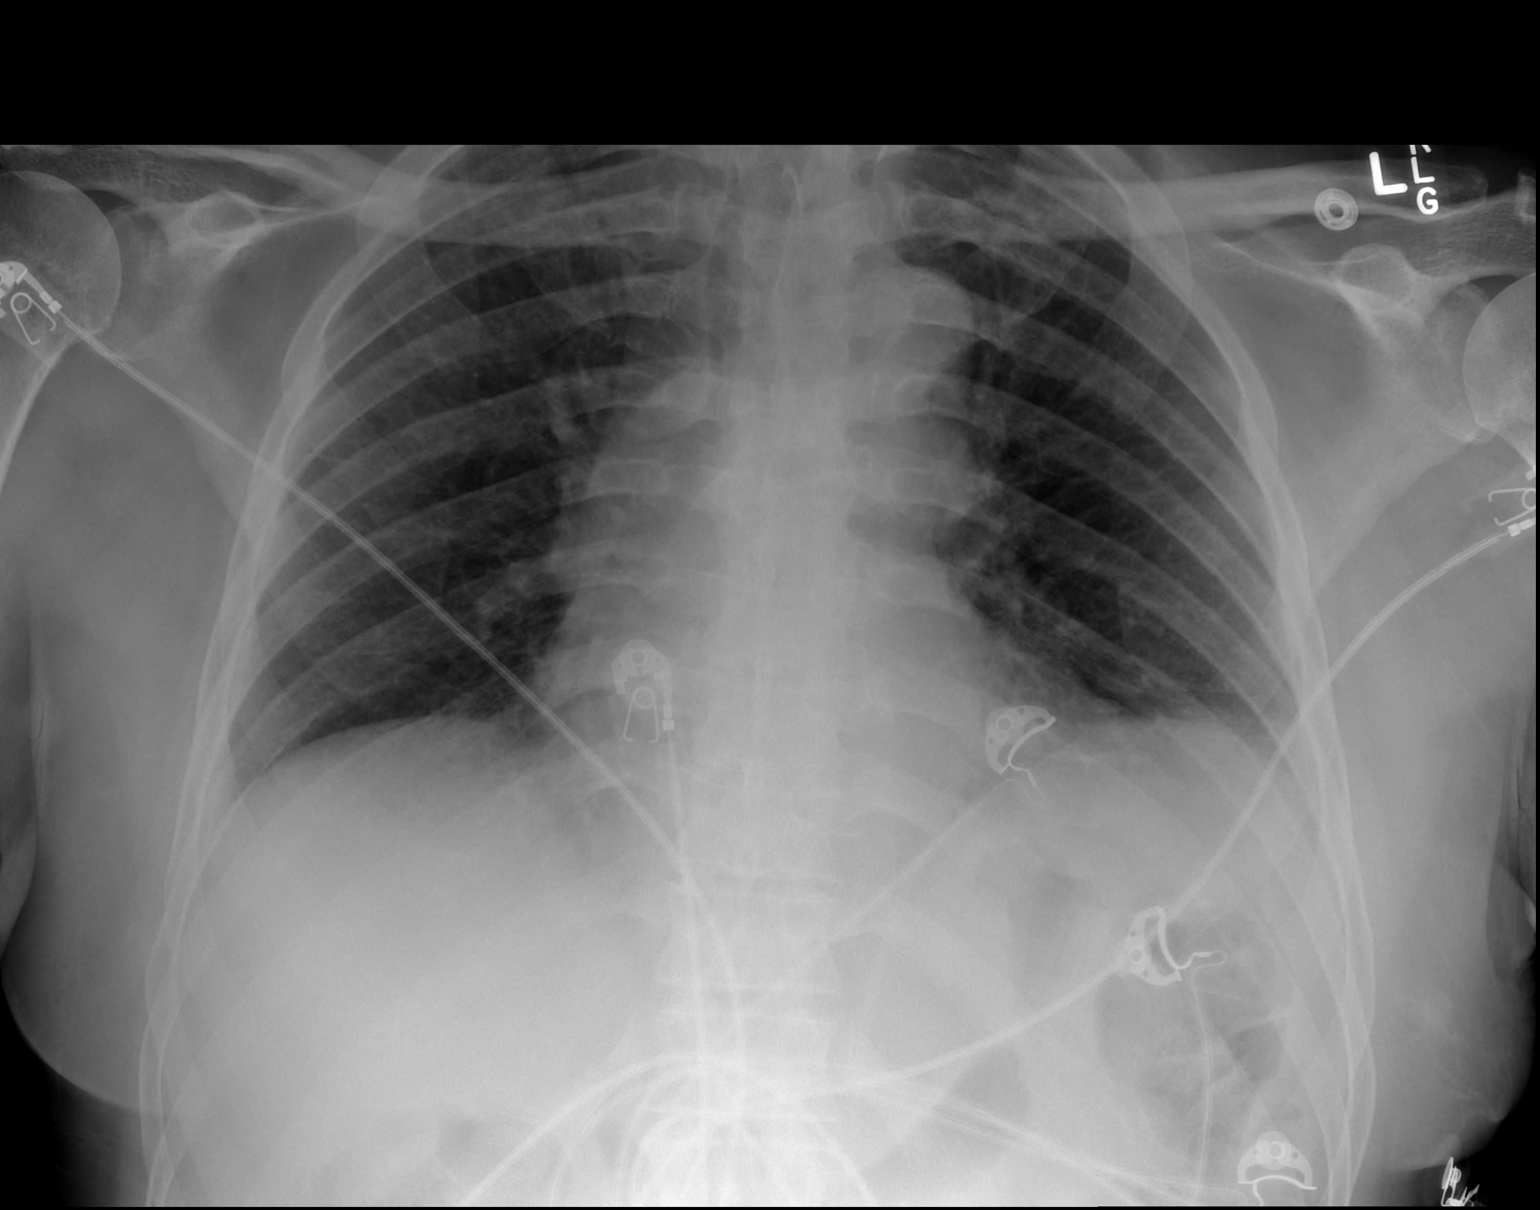

[w chest lat]
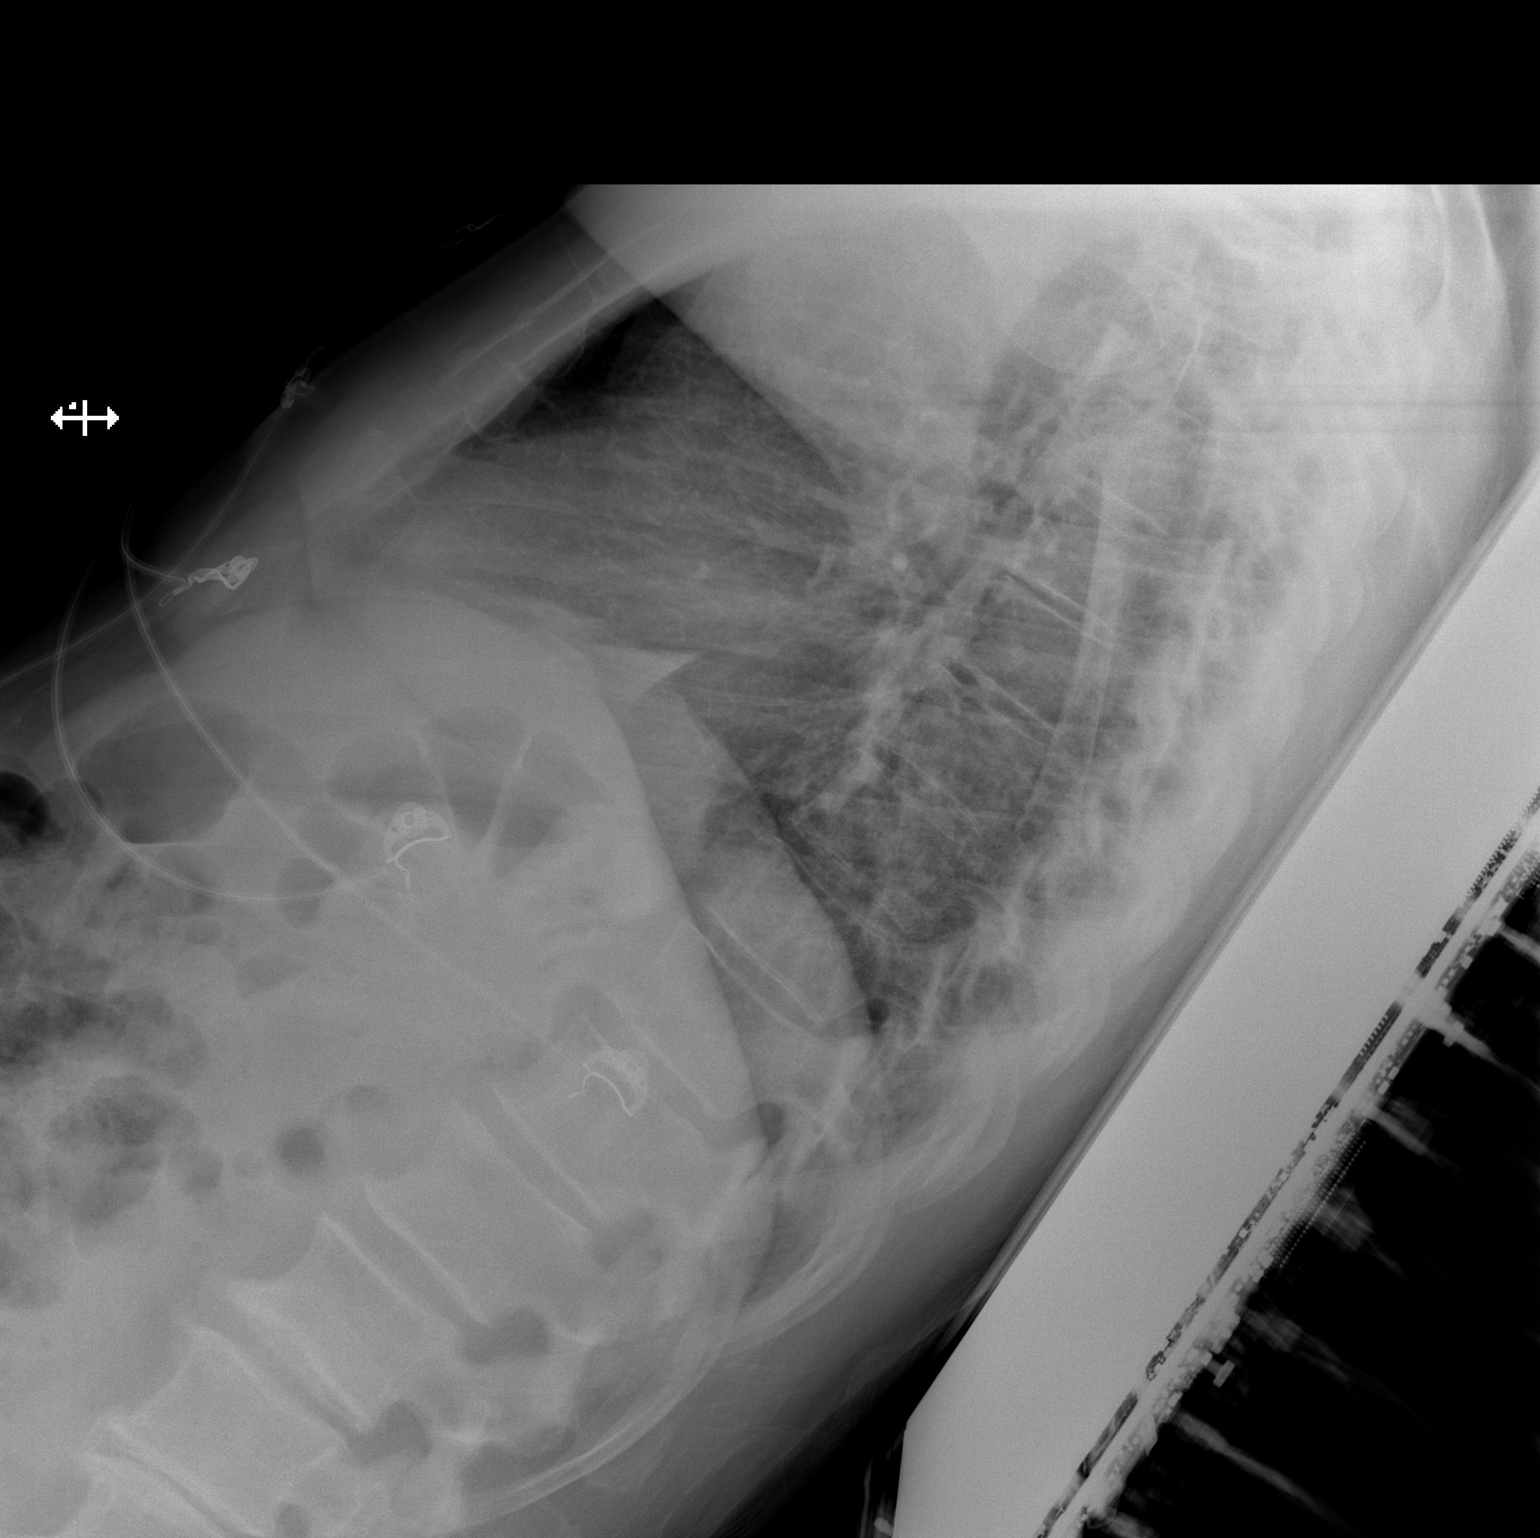

[2 of 2 positions shown; findings below may reference images not displayed]

FINDINGS: Cardiac shadow is within normal limits. The lungs are well aerated
bilaterally. Very minimal left basilar atelectasis is seen. No bony
abnormality is noted.
IMPRESSION: Minimal left basilar atelectasis.

## 2018-11-23 IMAGING — CT CT HEAD W/O CM
3 series · 16 of 47 positions shown, 19 images · non-contrast
Comparison: 09/27/2017, 10/02/2017

CLINICAL DATA: Status post glioblastoma resection with difficulty
speaking

EXAM:
CT HEAD WITHOUT CONTRAST
TECHNIQUE: Contiguous axial images were obtained from the base of the skull
through the vertex without intravenous contrast.

[Series 2: head wo · axial · 0.47mm/px · z∈[-70,+55]mm · 10 of 30 slices shown, 13 images]
[im 3/30  brain]
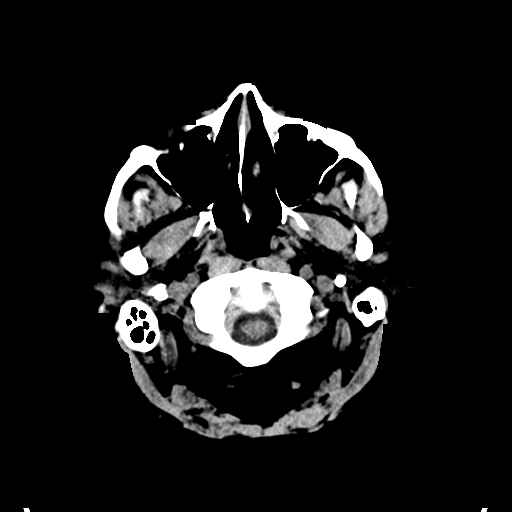
[im 3/30  bone]
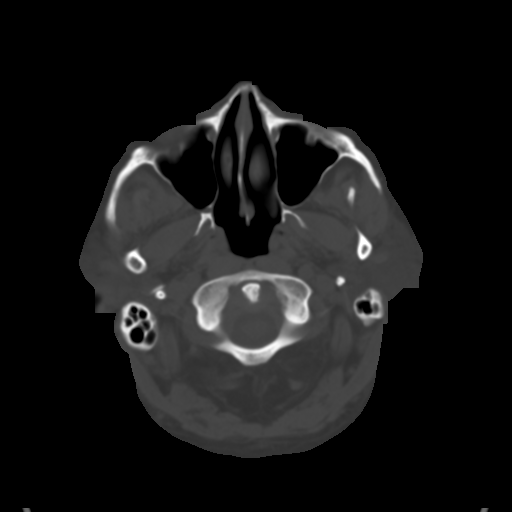
[im 6/30  brain]
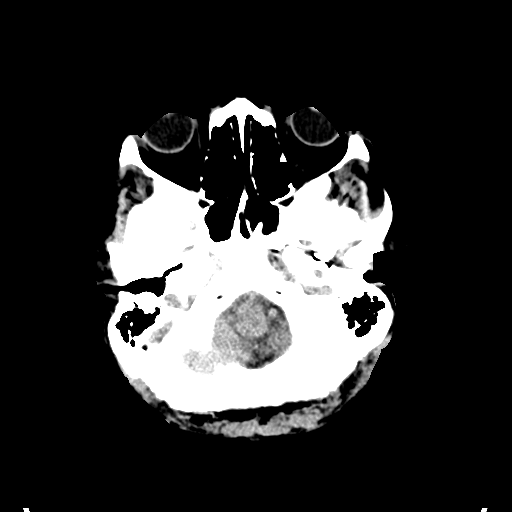
[im 9/30  brain]
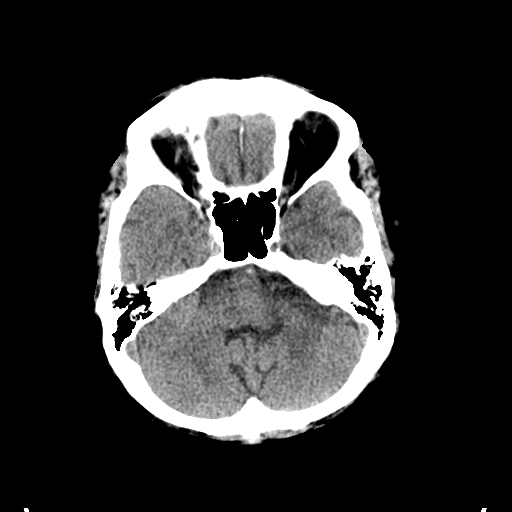
[im 11/30  brain]
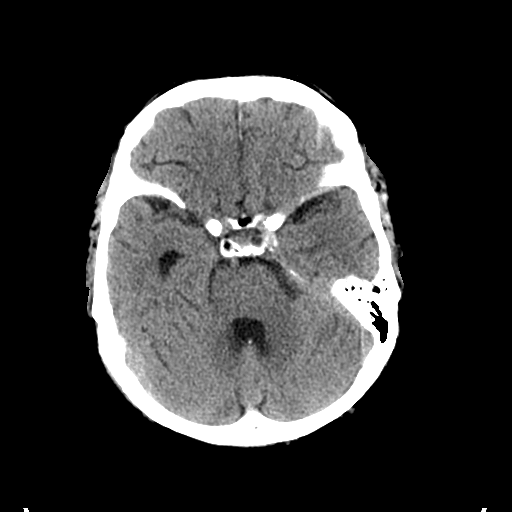
[im 14/30  brain]
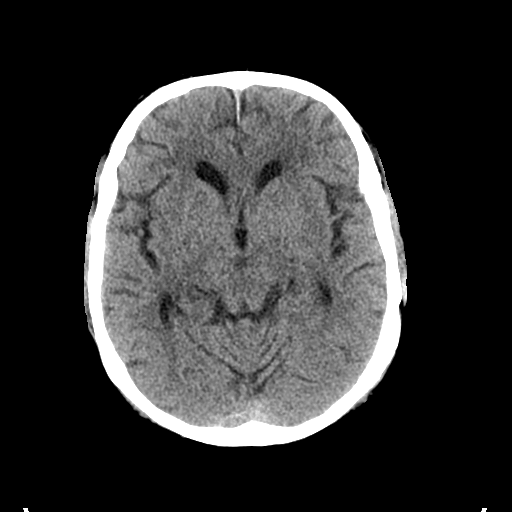
[im 14/30  bone]
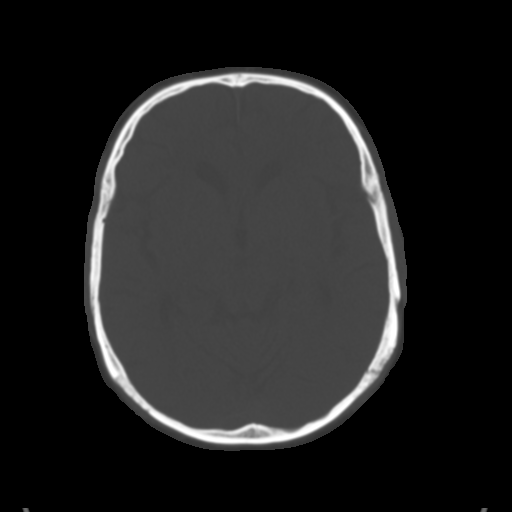
[im 17/30  brain]
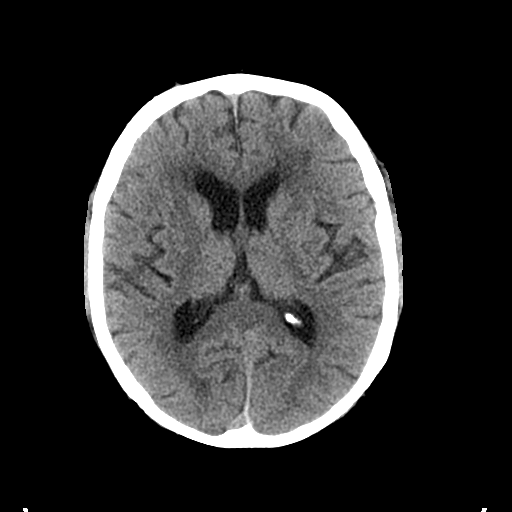
[im 20/30  brain]
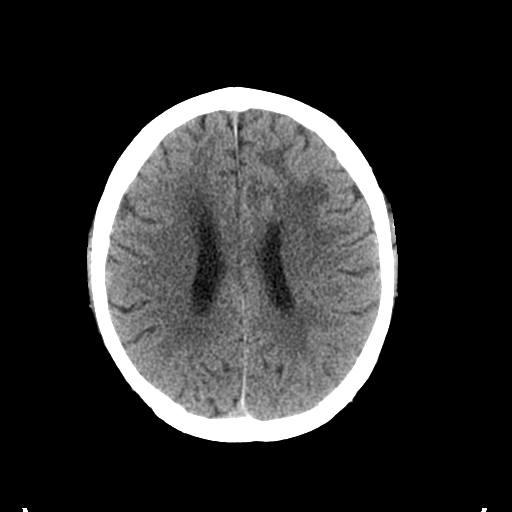
[im 23/30  brain]
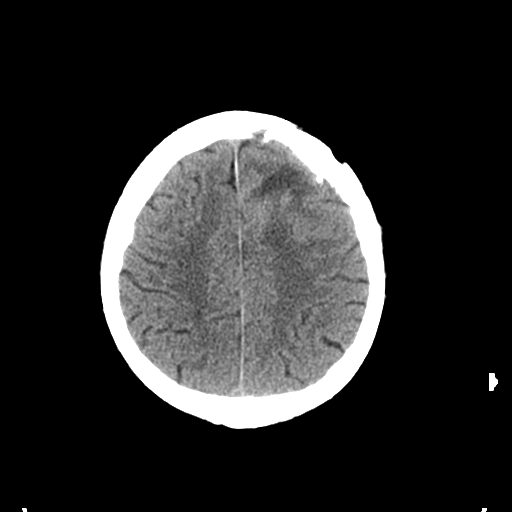
[im 25/30  brain]
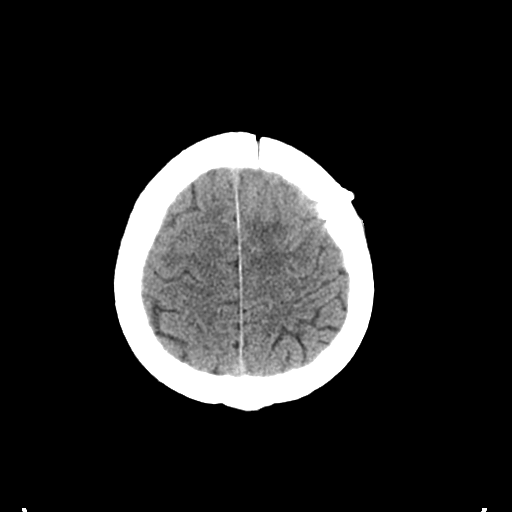
[im 25/30  bone]
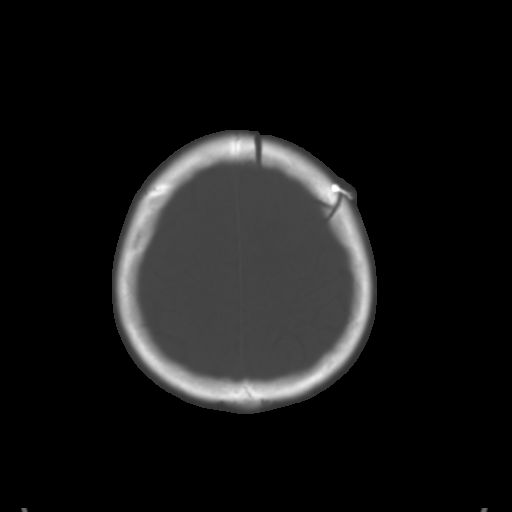
[im 28/30  brain]
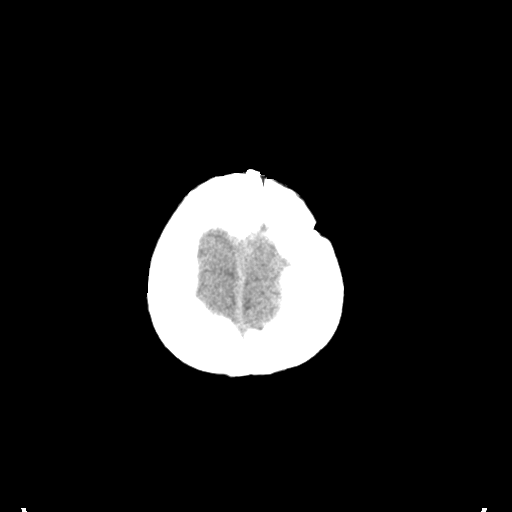

[Series 4: coronal soft tissue · coronal · 0.29mm/px · 3 of 60 slices shown]
[im 20/60  brain]
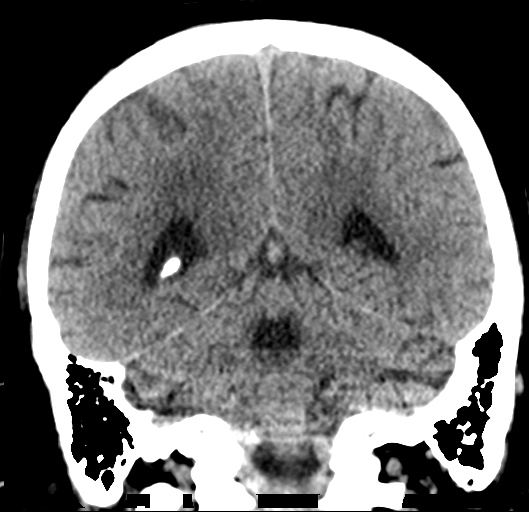
[im 27/60  brain]
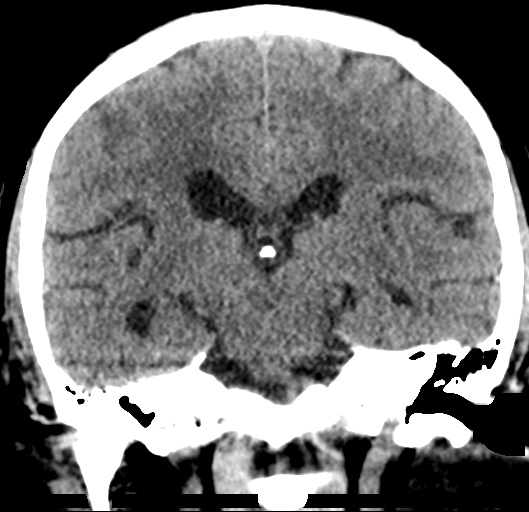
[im 33/60  brain]
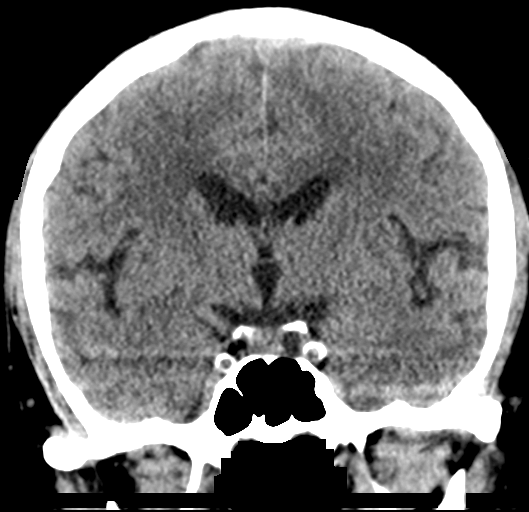

[Series 5: sagittal soft tissue · sagittal · 0.29mm/px · 3 of 52 slices shown]
[im 18/52  brain]
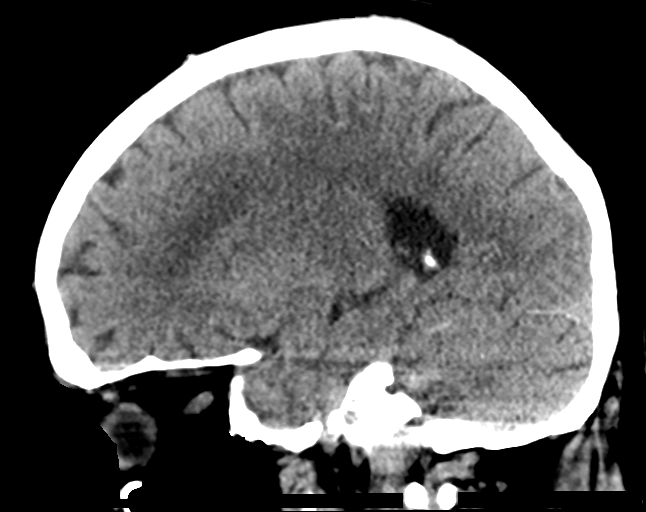
[im 26/52  brain]
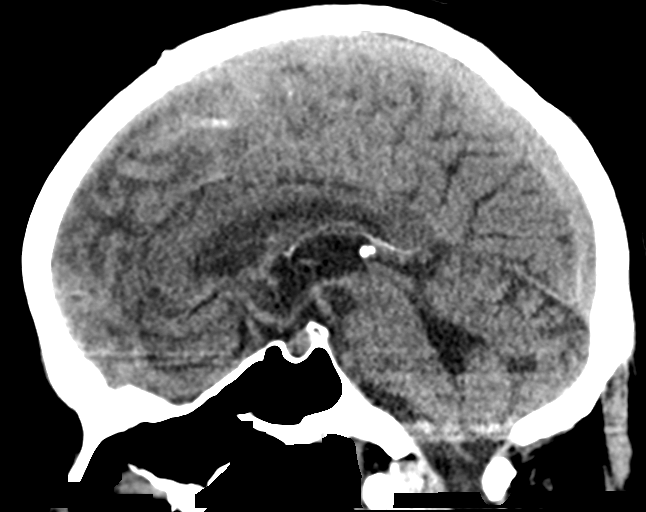
[im 35/52  brain]
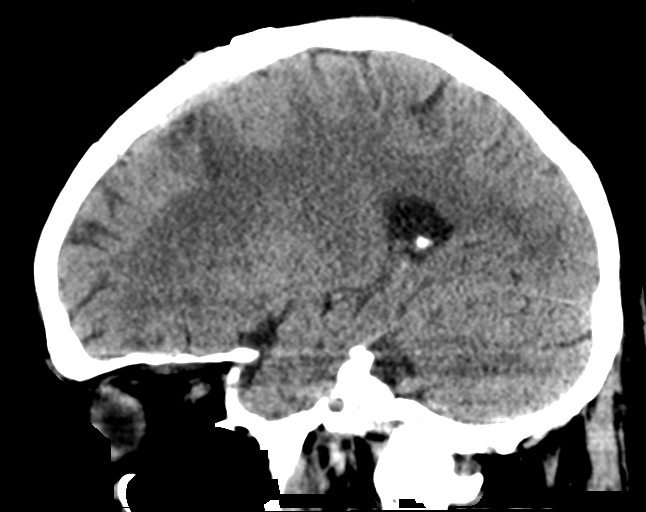

[16 of 47 positions shown; findings below may reference images not displayed]

FINDINGS: Brain: Postsurgical changes are noted in the left frontal lobe
consistent with the recent glioblastoma resection. Some residual
centrally necrotic neoplasm remains measuring approximately 2.3 x
2.3 cm. The overall appearance is stable when compared with the
prior MRI. No findings to suggest acute hemorrhage or acute infarct
are noted. Mild residual edematous changes are seen. No significant
midline shift is noted.

Vascular: No hyperdense vessel or unexpected calcification.

Skull: Postsurgical changes in the left frontal region.

Sinuses/Orbits: No acute finding.

Other: None.
IMPRESSION: Significant reduction in size in left frontal neoplasm when compared
with the preoperative exam. The overall appearance is stable when
compared with the recent MRI. No acute abnormality is noted.

## 2018-12-01 ENCOUNTER — Ambulatory Visit: Payer: Non-veteran care | Admitting: Internal Medicine

## 2018-12-01 ENCOUNTER — Other Ambulatory Visit: Payer: Non-veteran care

## 2018-12-08 IMAGING — CR DG CHEST 2V
2 series · 2 of 2 positions shown · non-contrast
Comparison: October 25, 2017

CLINICAL DATA: Brain cancer.  Fever.

EXAM:
CHEST - 2 VIEW

[w chest pa]
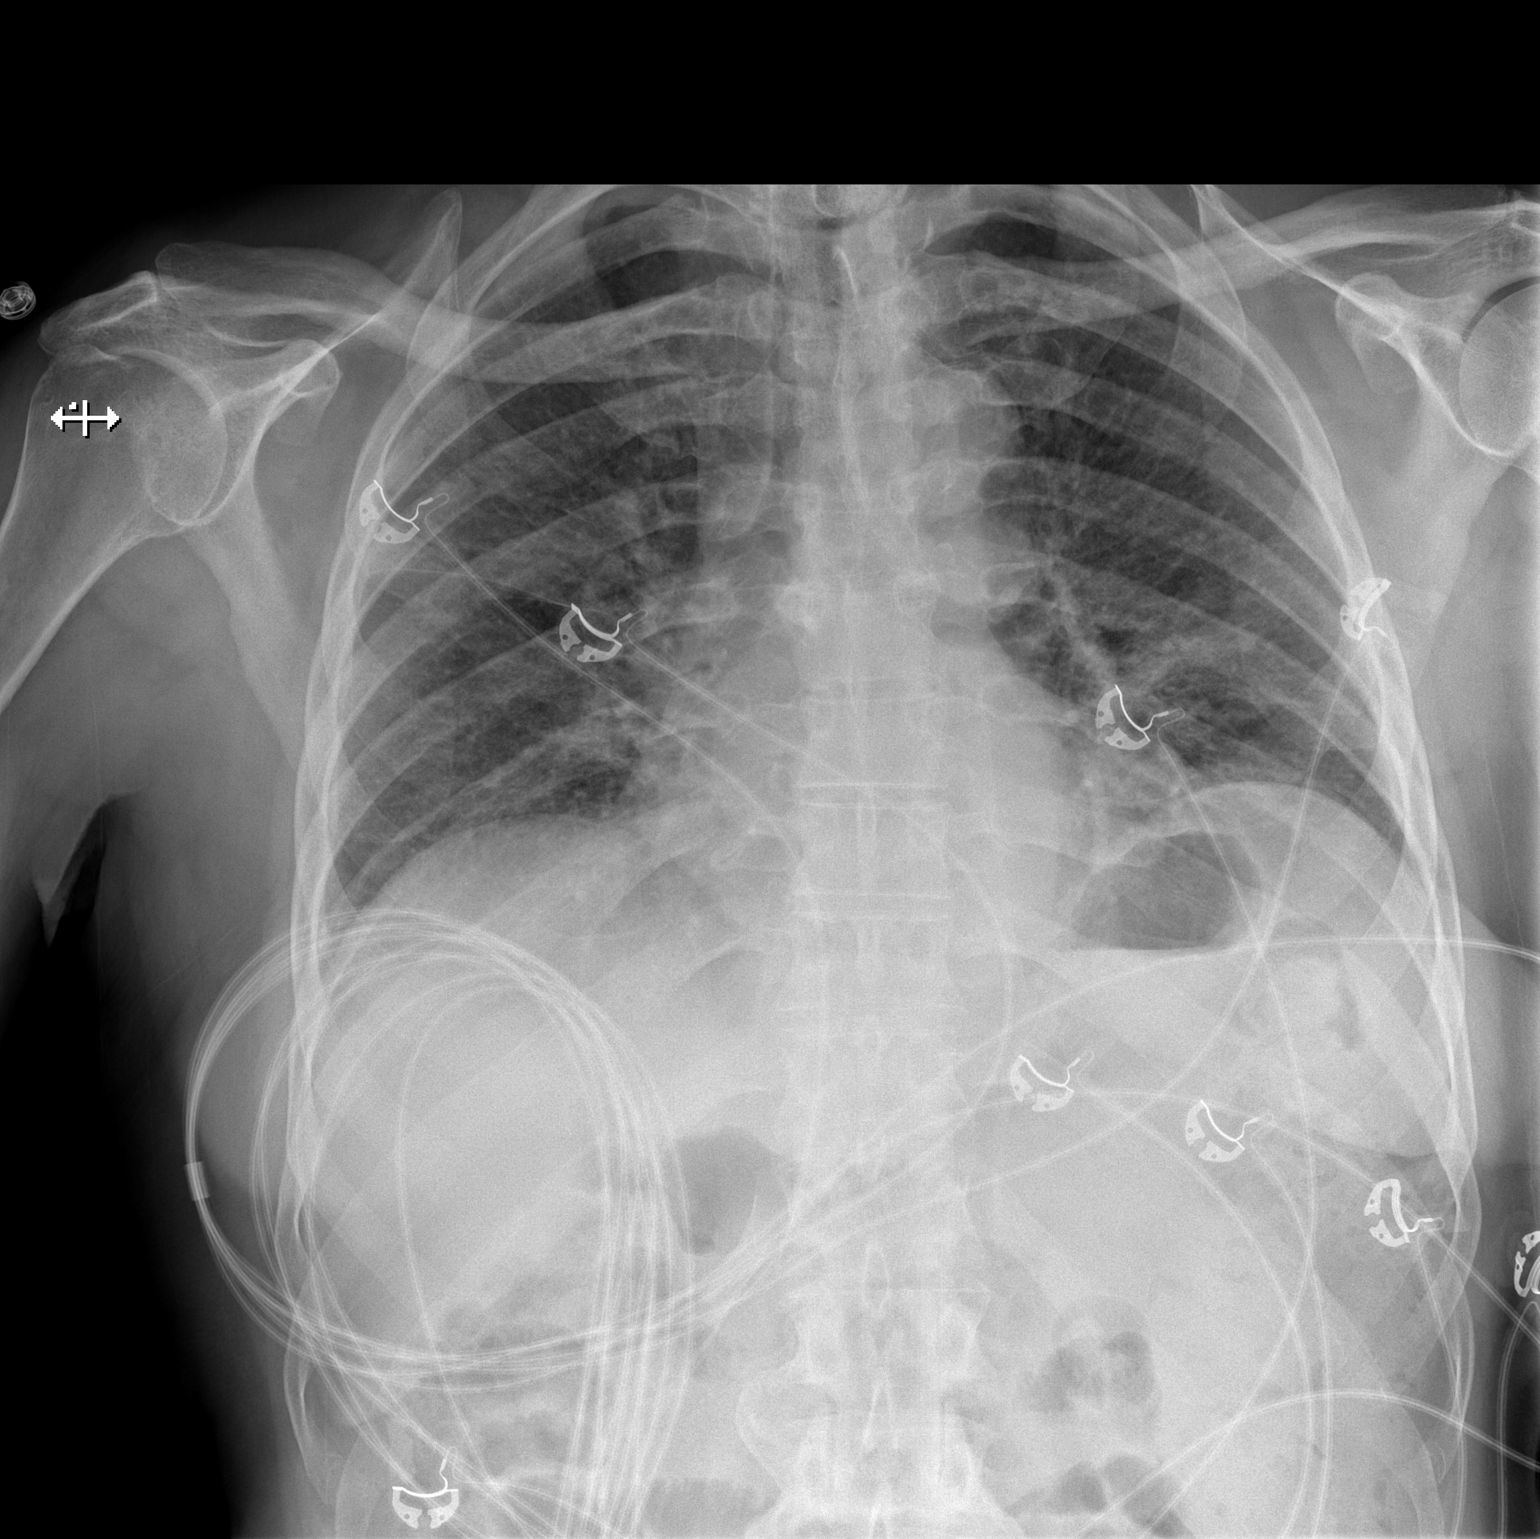

[w chest lat]
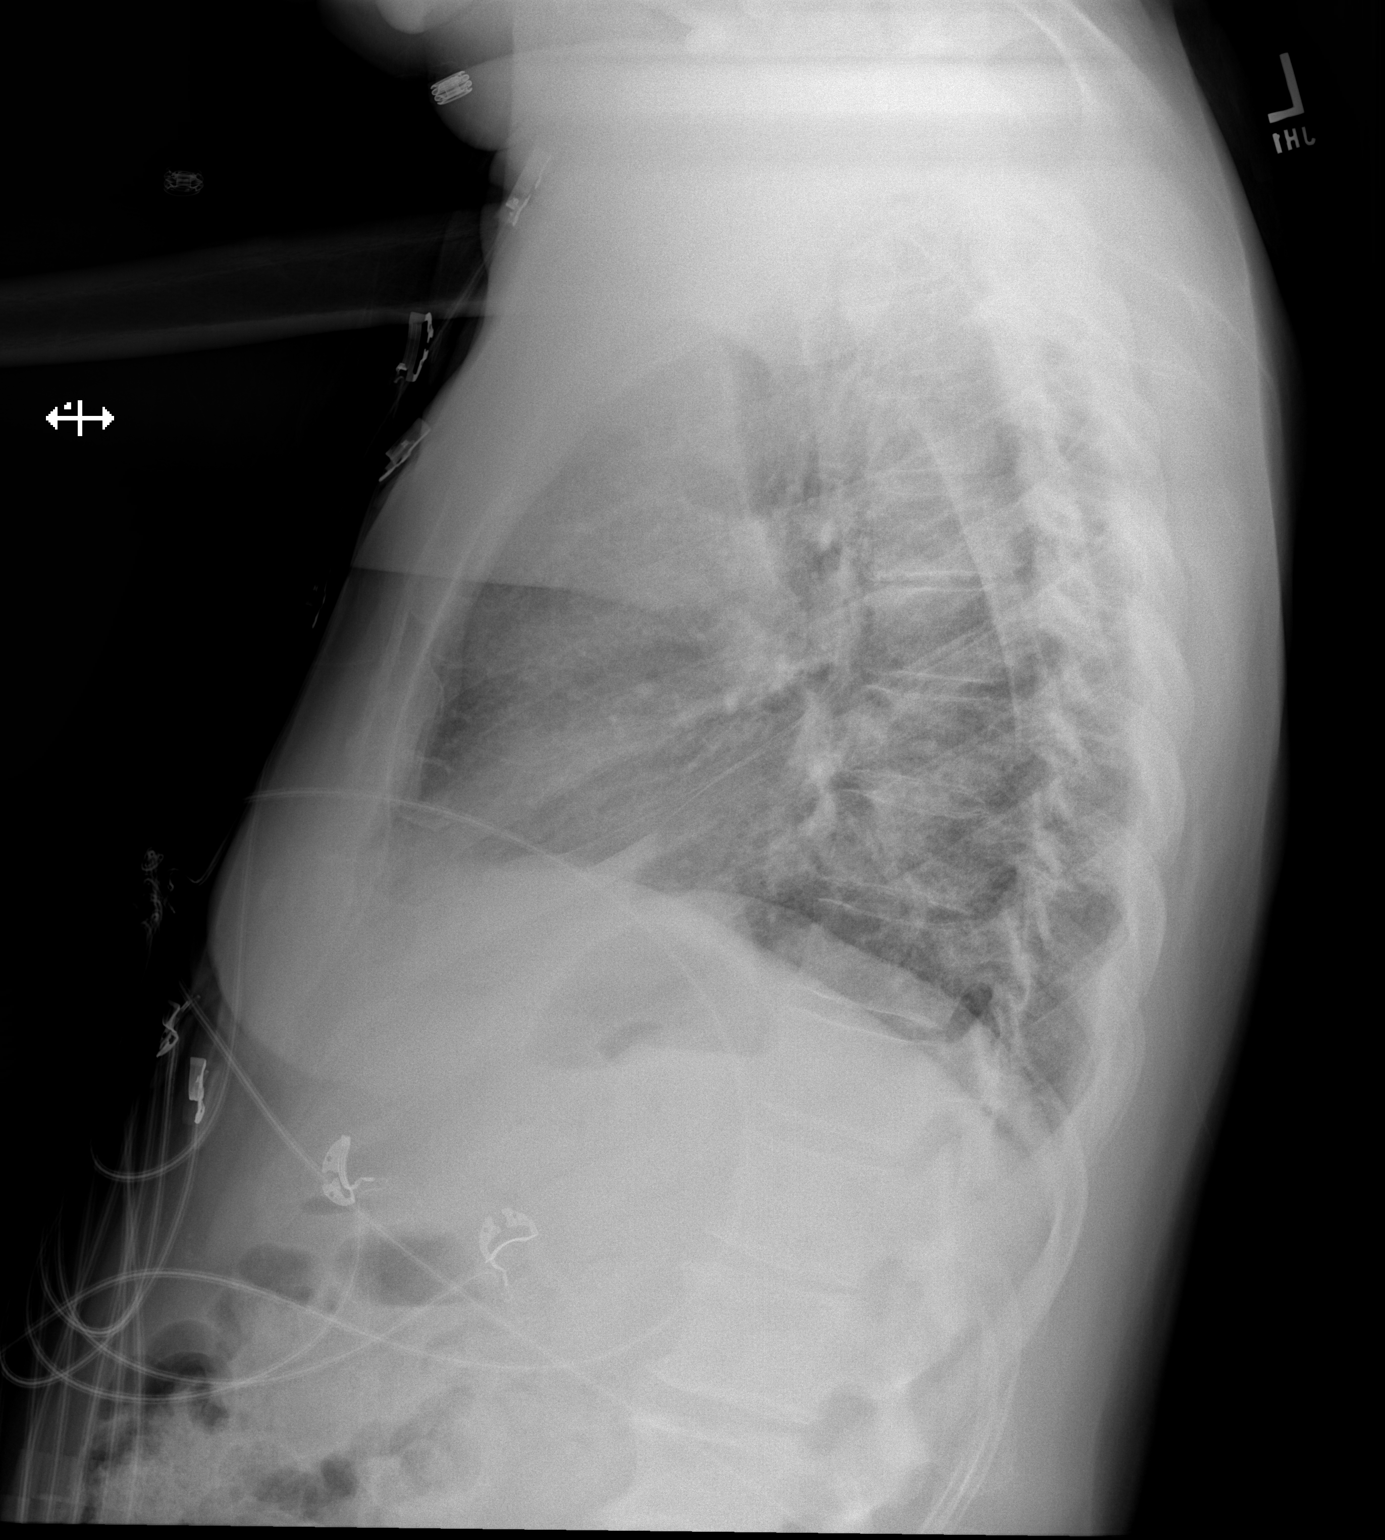

[2 of 2 positions shown; findings below may reference images not displayed]

FINDINGS: Increased interstitial markings in the lungs. The heart size
borderline. The hila and mediastinum are normal. No pulmonary
nodules or masses. No focal infiltrates.
IMPRESSION: Increasing interstitial opacities in the lungs could represent
atypical infection or edema.

## 2018-12-10 IMAGING — DX DG CHEST 2V
2 series · 2 of 2 positions shown · non-contrast
Comparison: PA and lateral chest x-ray November 09, 2017

CLINICAL DATA: Followup interstitial opacities.  Persistent fever.

EXAM:
CHEST - 2 VIEW

[chest pa]
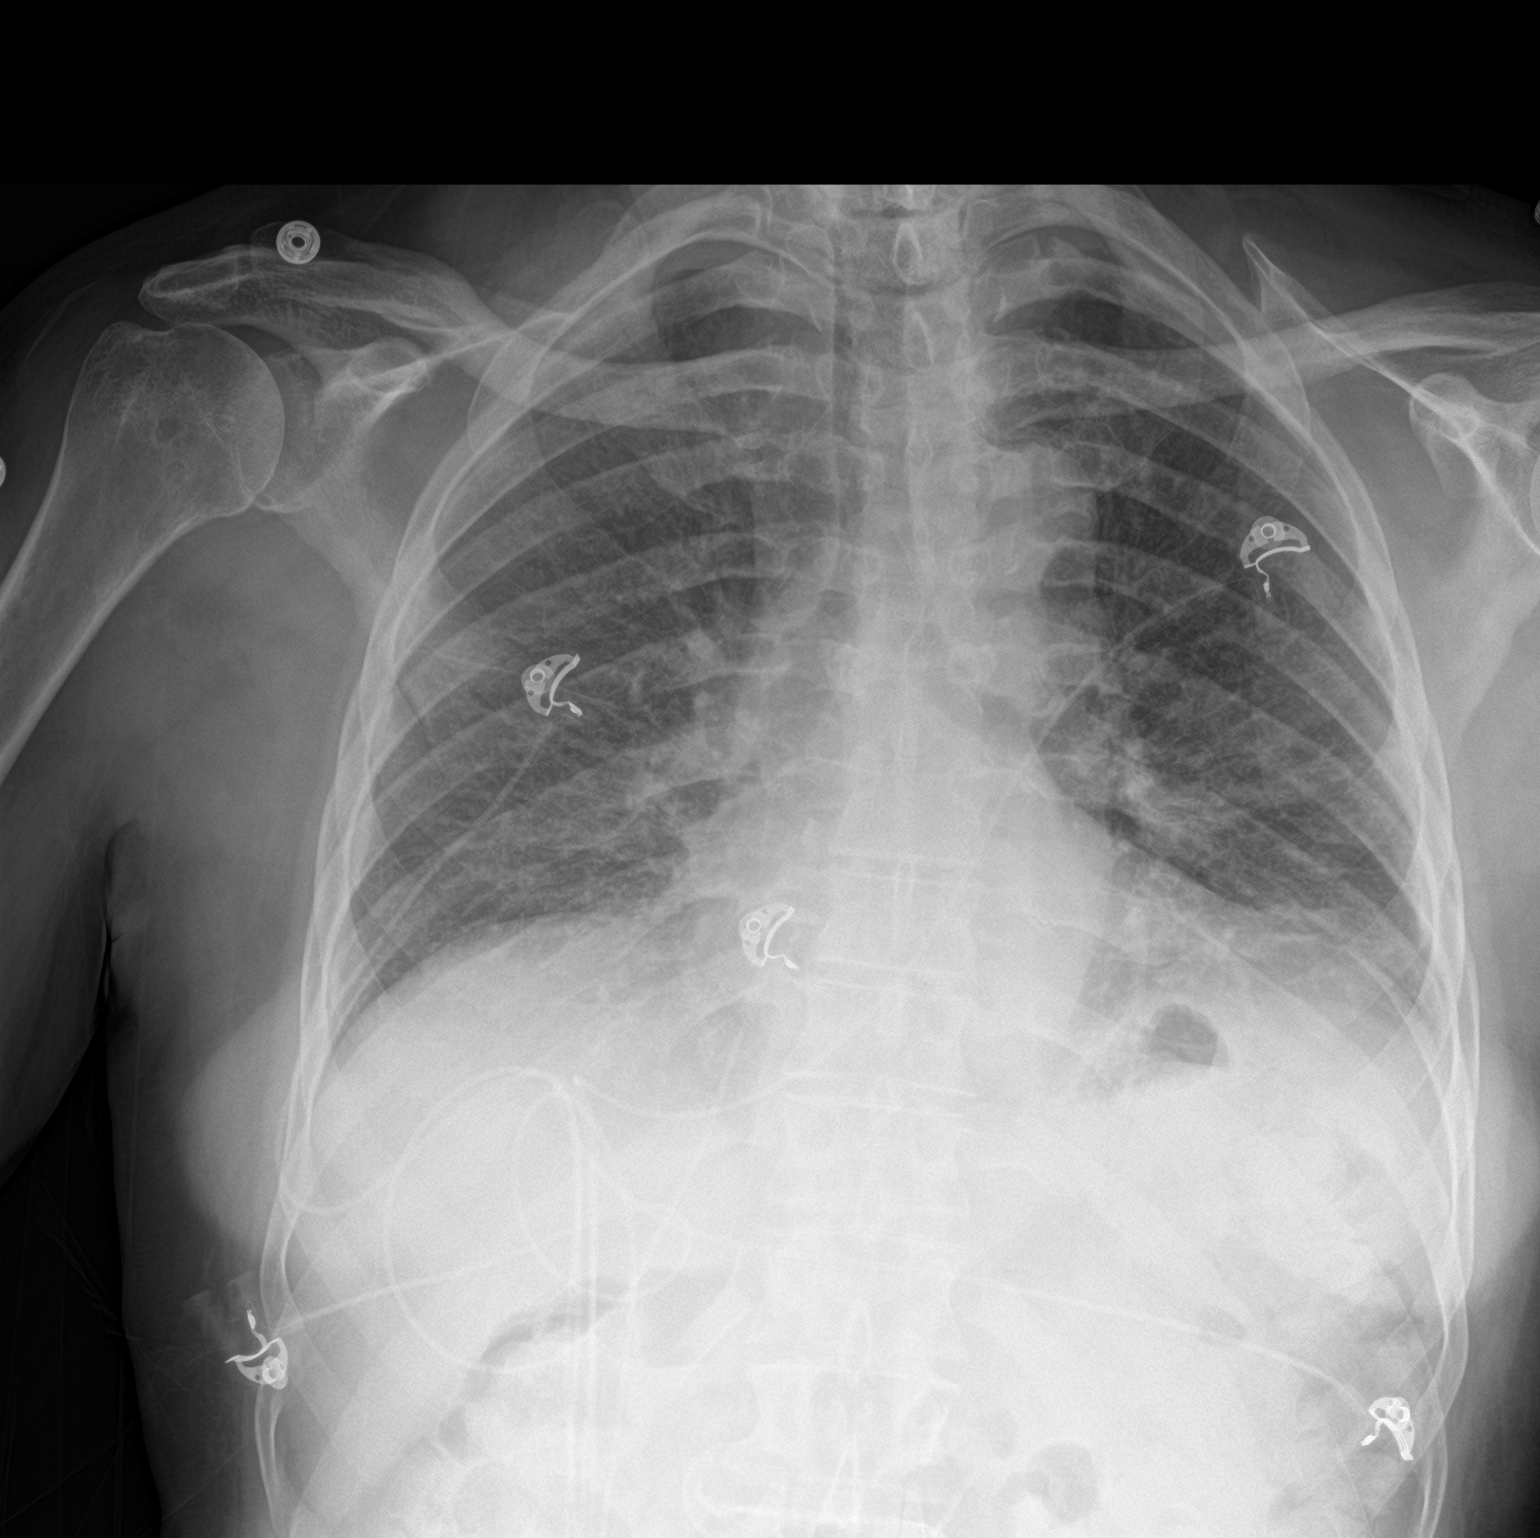

[chest lat]
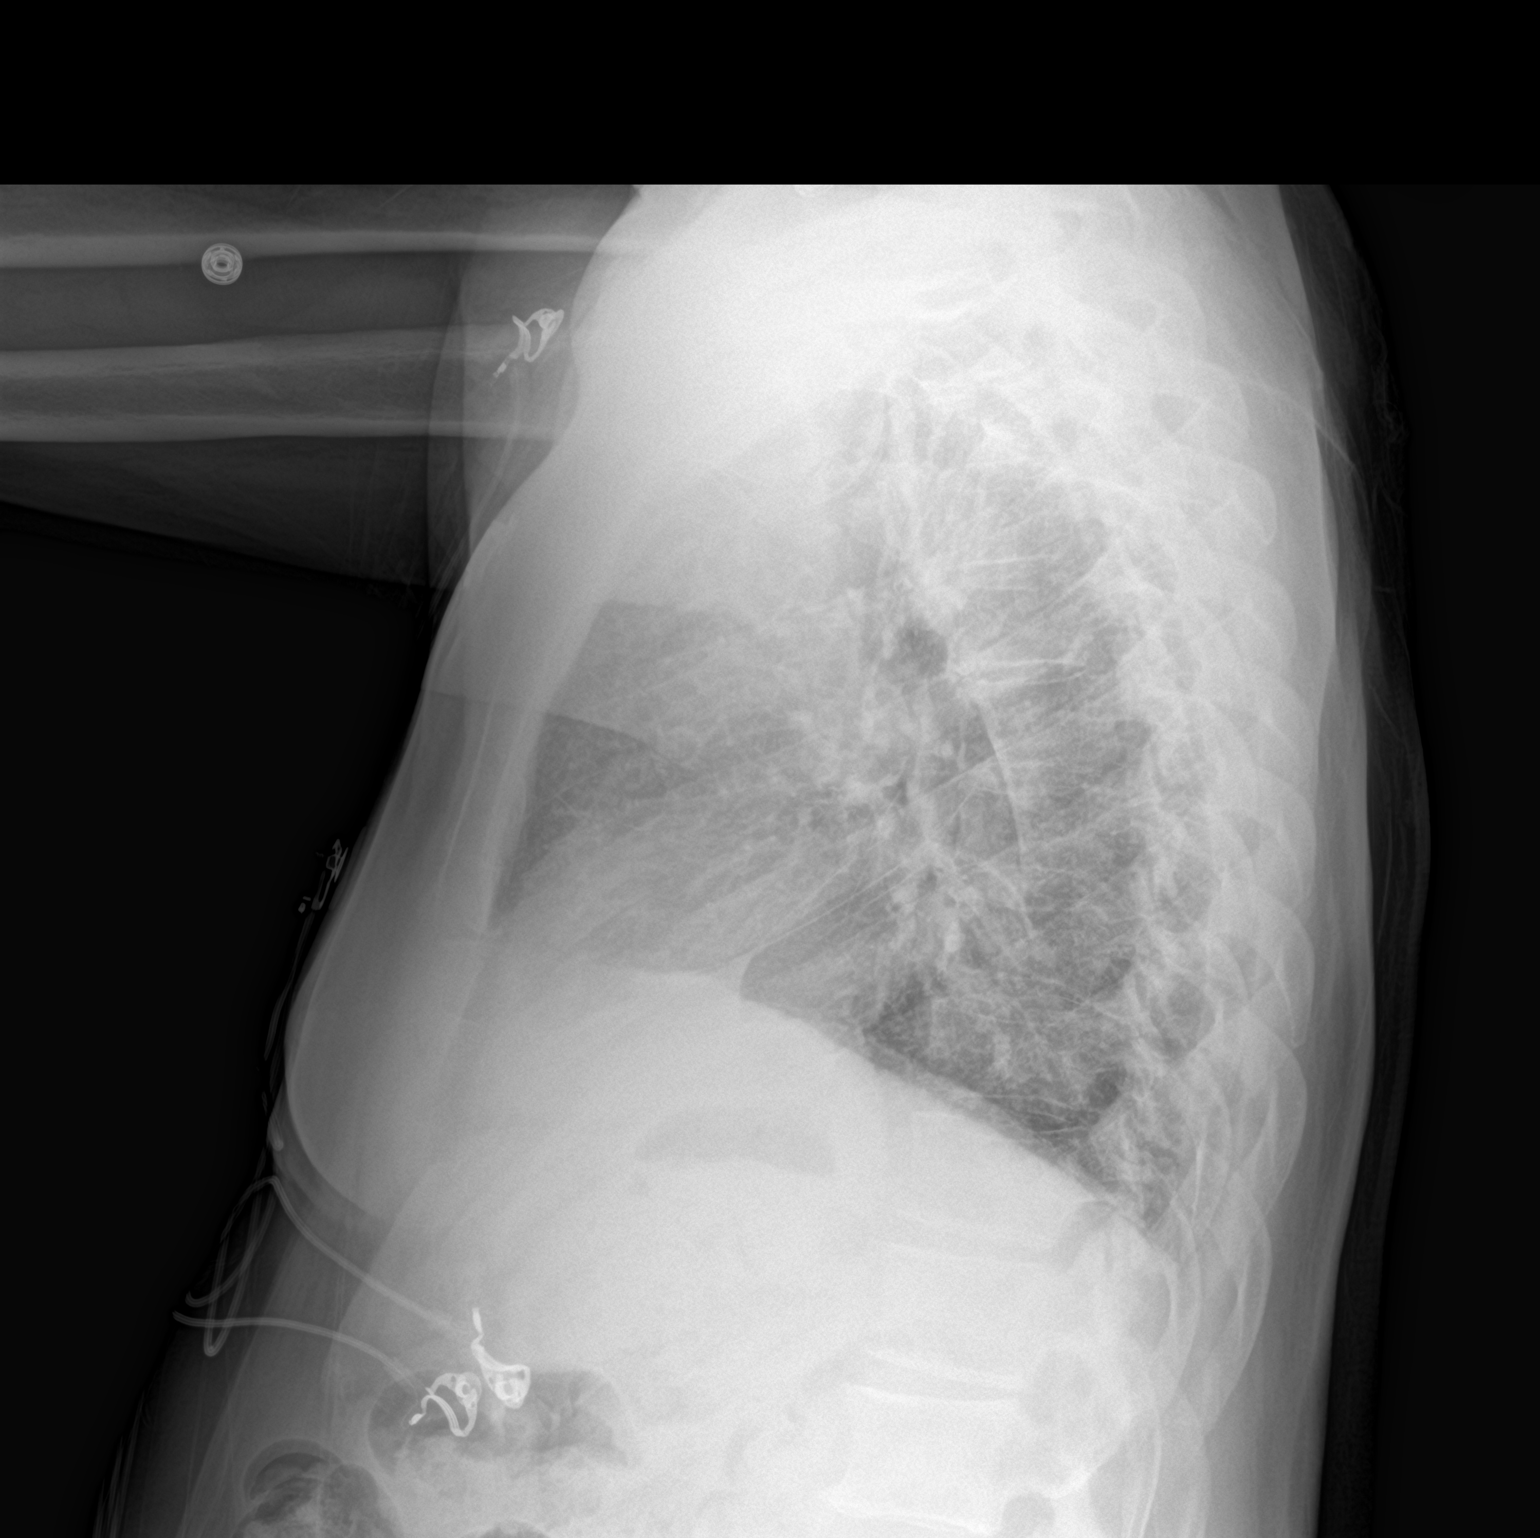

[2 of 2 positions shown; findings below may reference images not displayed]

FINDINGS: The lungs are adequately inflated. The interstitial markings are
coarse but stable. There is no pleural effusion. The heart and
pulmonary vascularity are normal. The mediastinum is normal in
width. There is calcification in the wall of the aortic arch. The
trachea is midline. The bony thorax exhibits no acute abnormality.
IMPRESSION: Persistent interstitial changes bilaterally little change since the
study [REDACTED] but much more conspicuous than on the study [DATE].. This could reflect interstitial pneumonia or
atypical pulmonary edema though I favor the former. There is no
pleural effusion.

Thoracic aortic atherosclerosis.

## 2018-12-12 IMAGING — CR DG CHEST 2V
2 series · 2 of 2 positions shown · non-contrast
Comparison: 11/11/2017

CLINICAL DATA: Fevers

EXAM:
CHEST - 2 VIEW

[w chest lat]
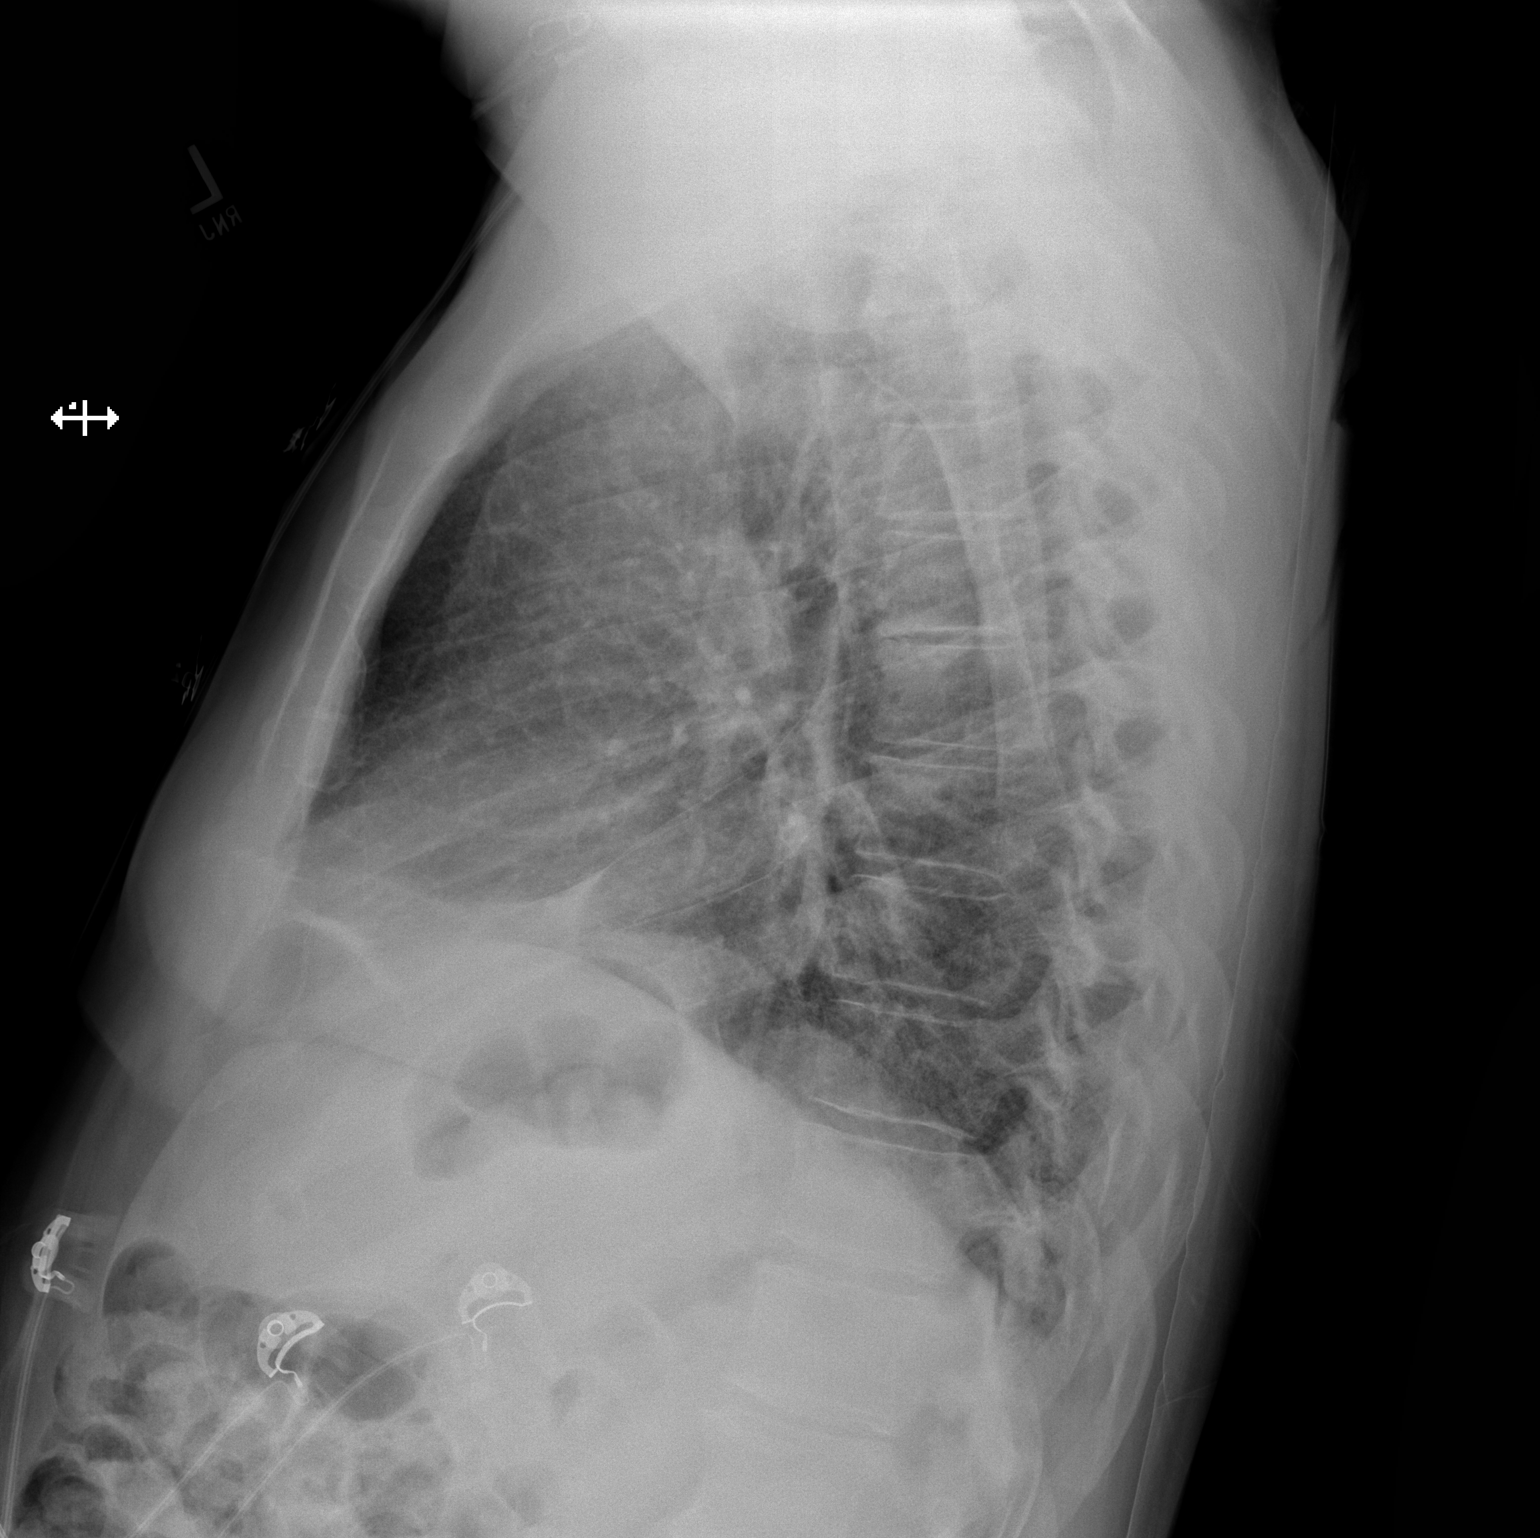

[x chest ap]
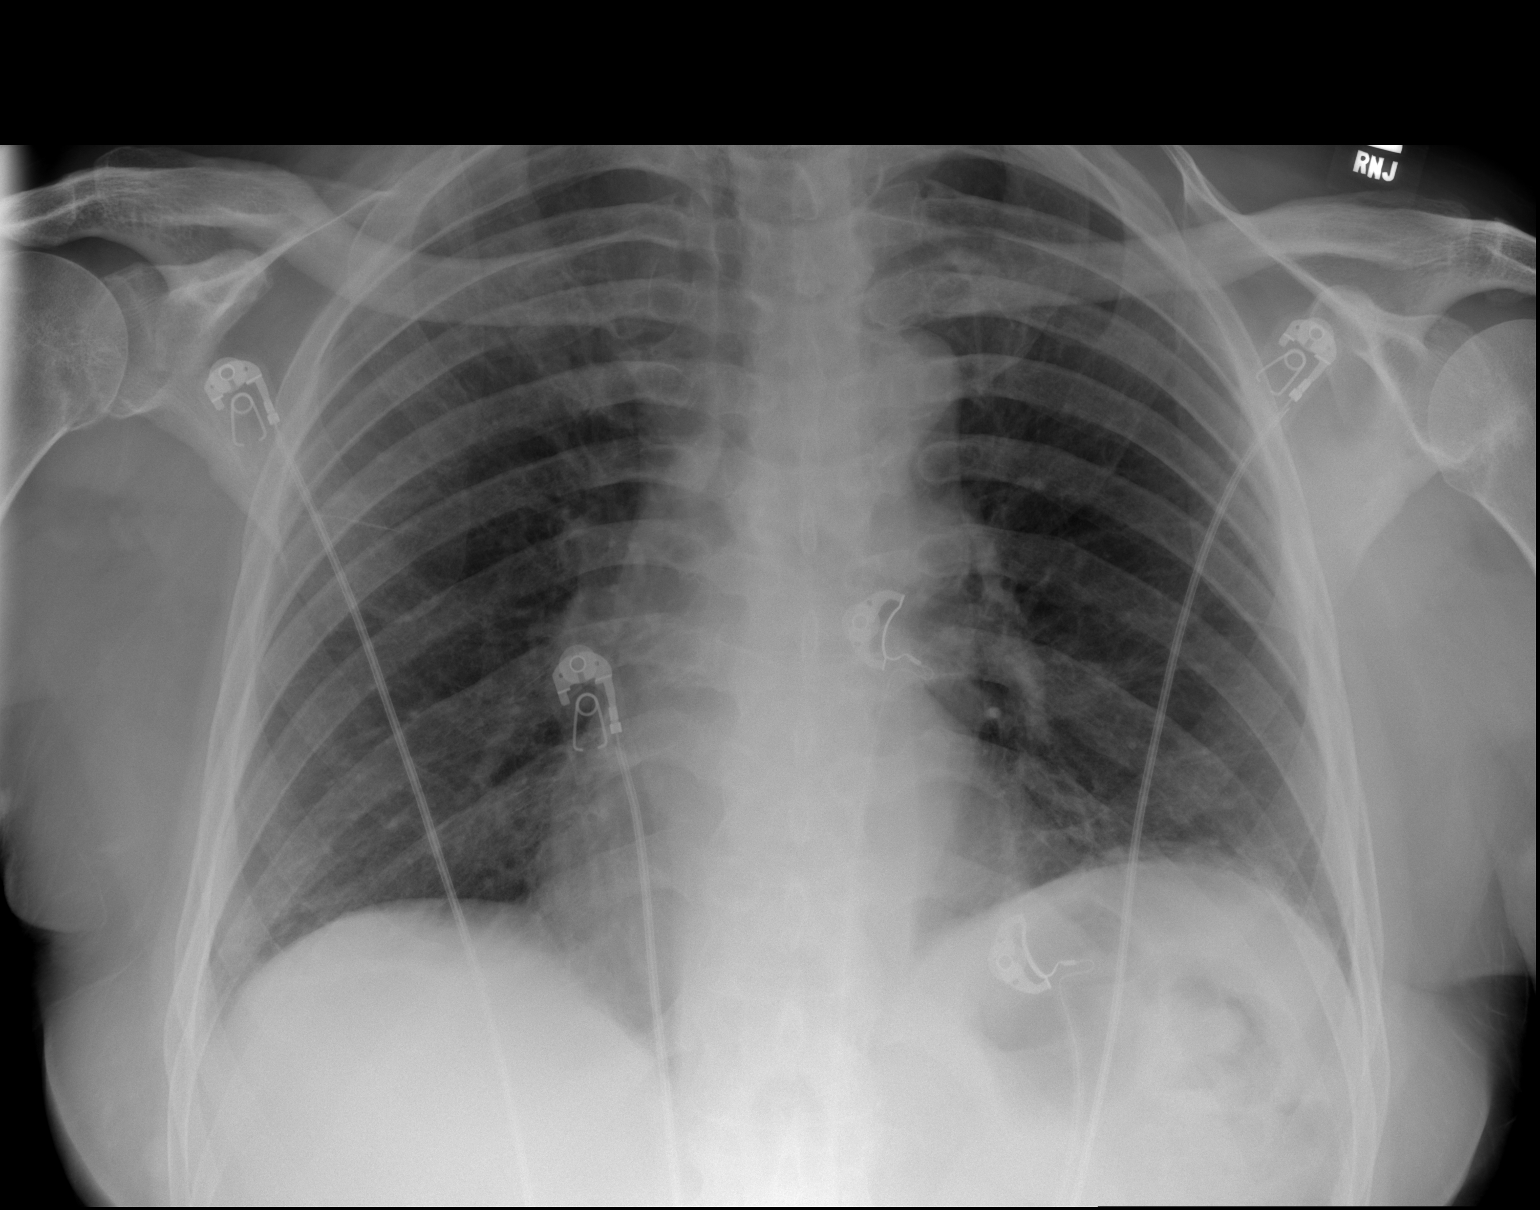

[2 of 2 positions shown; findings below may reference images not displayed]

FINDINGS: Cardiac shadow is stable. The lungs are well aerated bilaterally
without focal infiltrate. Improved aeration in the bases is noted
bilaterally. No sizable effusion is seen. No bony abnormality is
noted.
IMPRESSION: Resolution of previously seen interstitial changes. No focal acute
abnormality is noted.

## 2018-12-12 IMAGING — CT CT HEAD W/O CM
3 series · 15 of 46 positions shown, 18 images · non-contrast
Comparison: 10/25/2017 CT head.  10/02/2017 MRI head.

CLINICAL DATA: 77 y/o M; fever and weakness. History of
glioblastoma.

EXAM:
CT HEAD WITHOUT CONTRAST
TECHNIQUE: Contiguous axial images were obtained from the base of the skull
through the vertex without intravenous contrast.

[Series 2: head wo · axial · 0.47mm/px · z∈[-55,+65]mm · 9 of 29 slices shown, 12 images]
[im 3/29  brain]
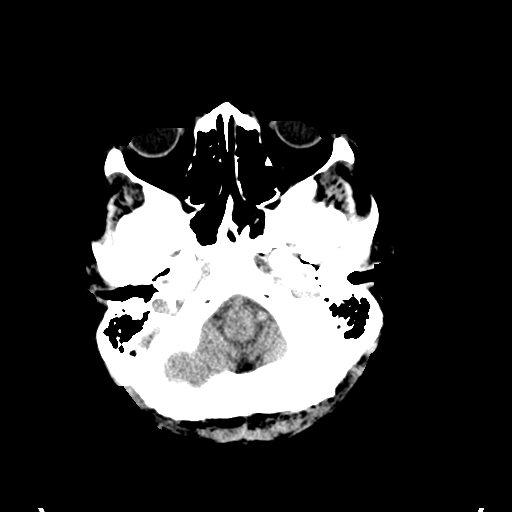
[im 3/29  bone]
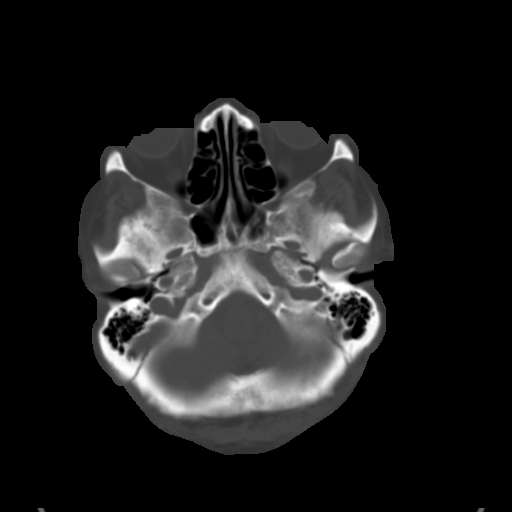
[im 6/29  brain]
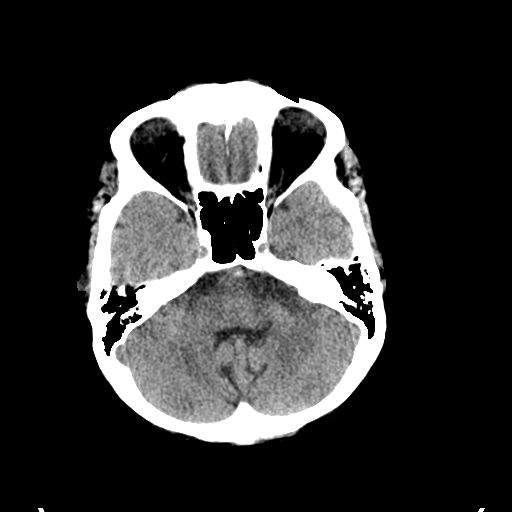
[im 9/29  brain]
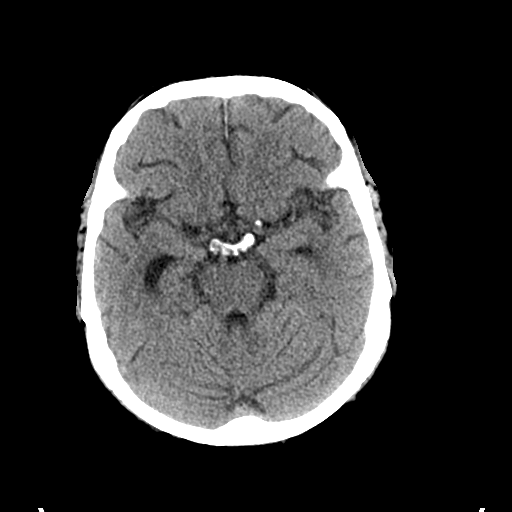
[im 12/29  brain]
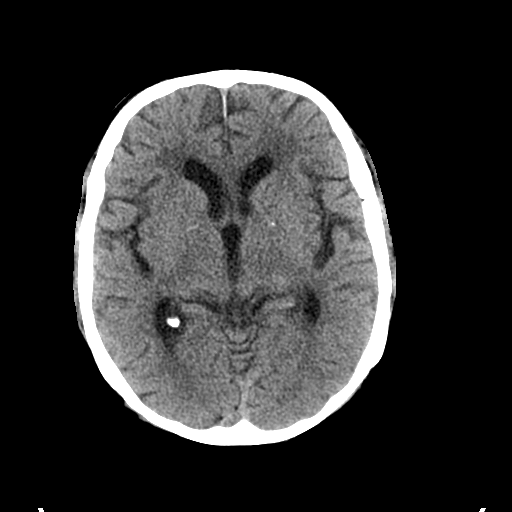
[im 15/29  brain]
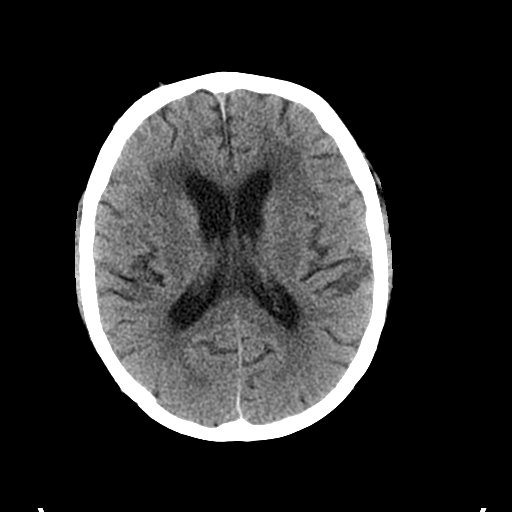
[im 15/29  bone]
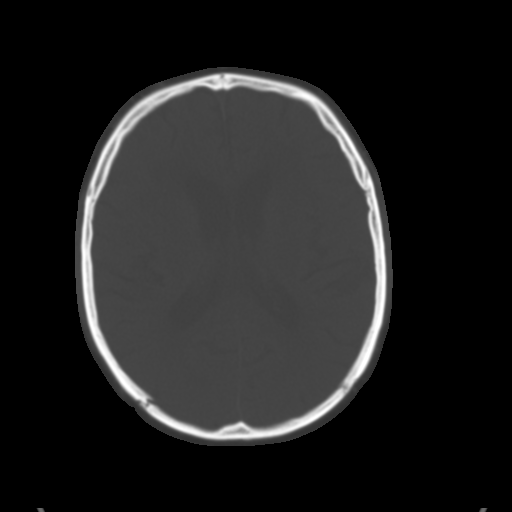
[im 18/29  brain]
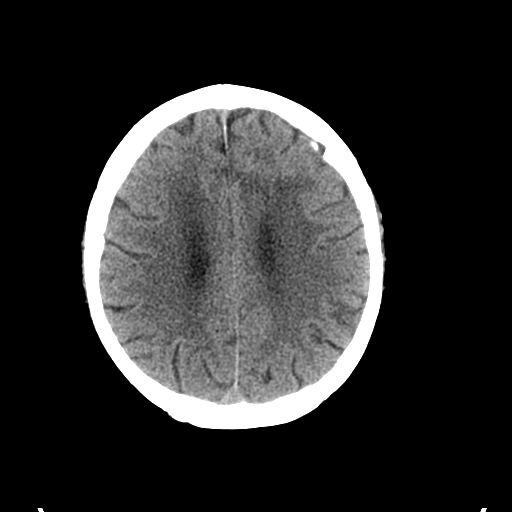
[im 21/29  brain]
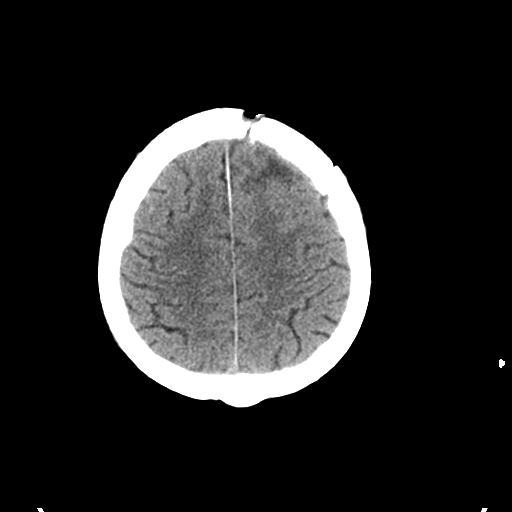
[im 24/29  brain]
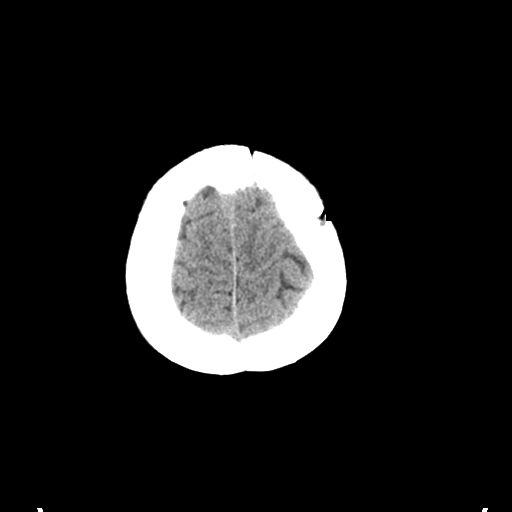
[im 27/29  brain]
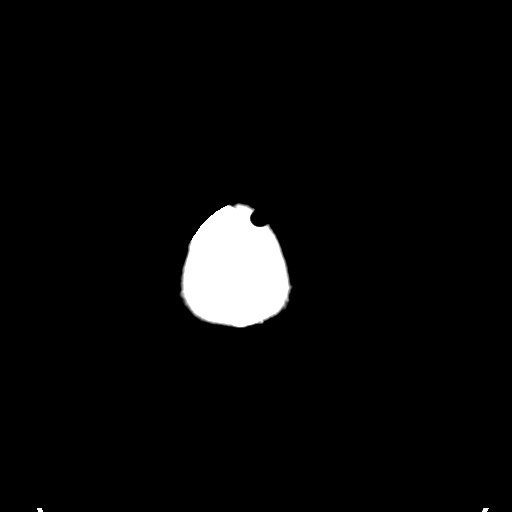
[im 27/29  bone]
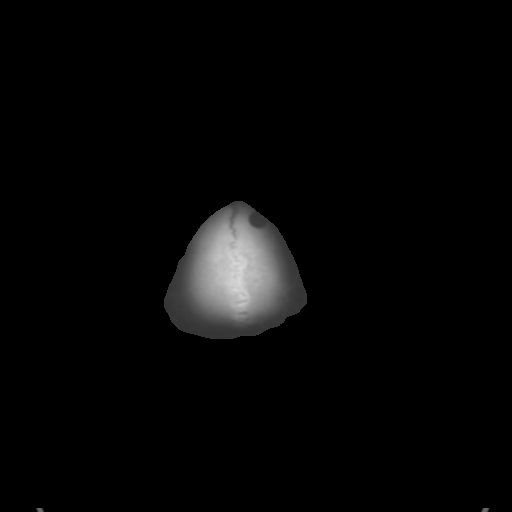

[Series 4: coronal soft tissue · coronal · 0.28mm/px · 3 of 63 slices shown]
[im 21/63  brain]
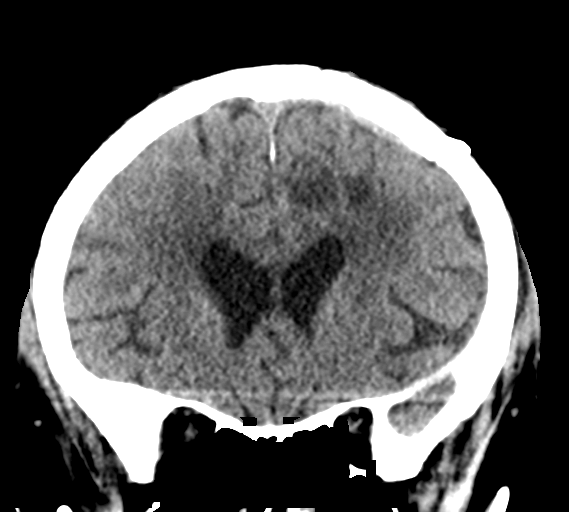
[im 28/63  brain]
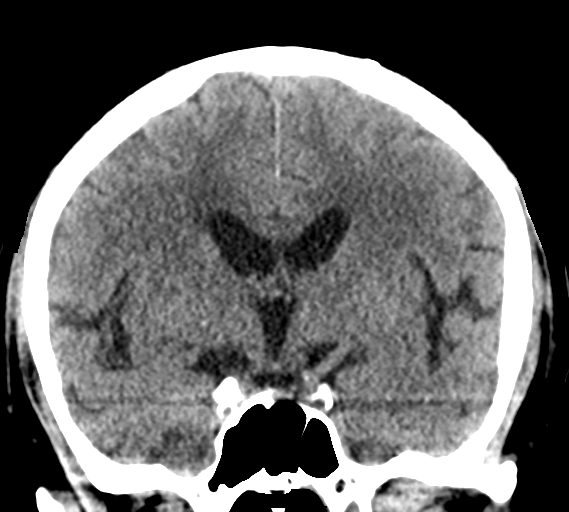
[im 35/63  brain]
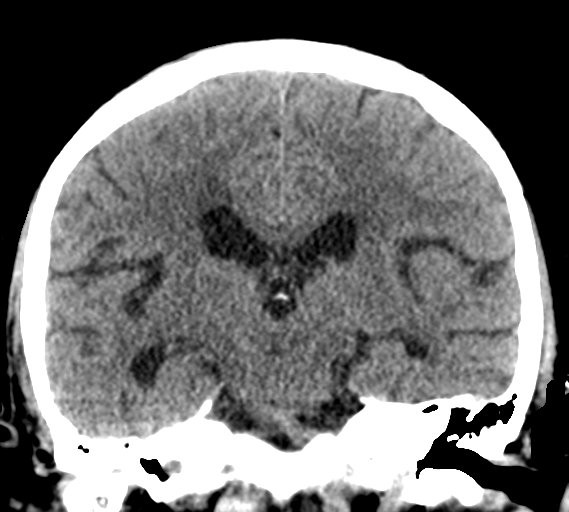

[Series 5: sagittal soft tissue · sagittal · 0.28mm/px · 3 of 54 slices shown]
[im 18/54  brain]
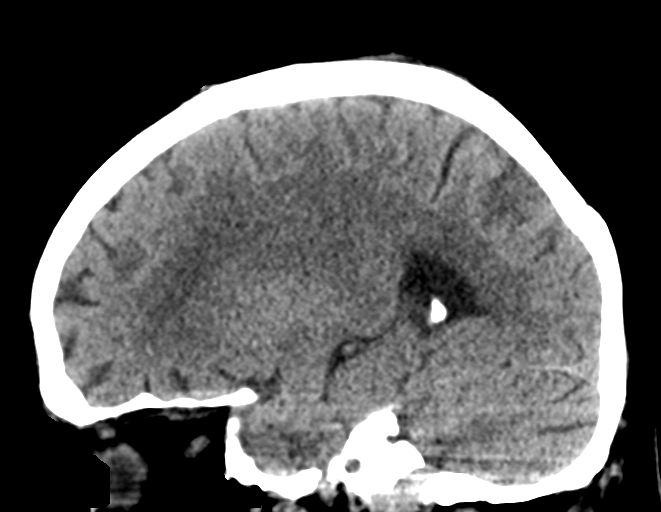
[im 27/54  brain]
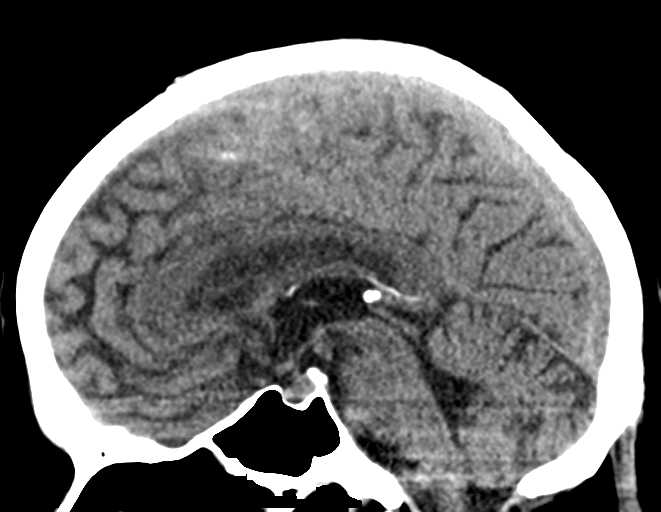
[im 36/54  brain]
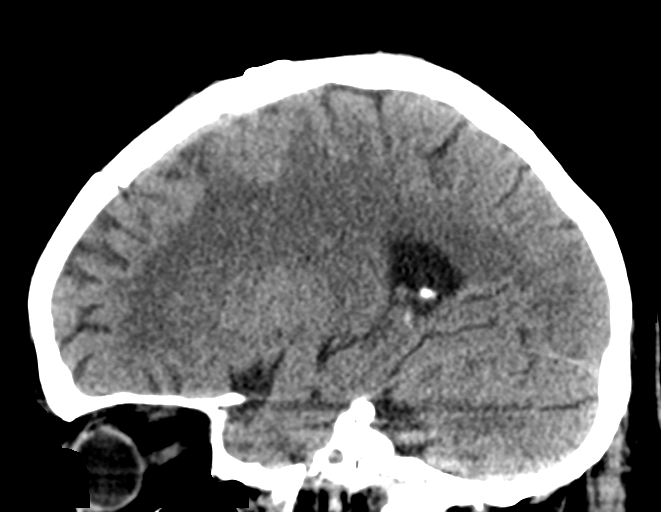

[15 of 46 positions shown; findings below may reference images not displayed]

FINDINGS: Brain: Left paramedian frontal lobe resection cavity, surrounding
edema, and associated focal mass effect is stable. Associated mass
effect no evidence for stroke, hemorrhage, or new focal mass effect
in the brain. No hydrocephalus or herniation.

Vascular: No hyperdense vessel or unexpected calcification.

Skull: Stable chronic postsurgical changes related to left frontal
craniotomy.

Sinuses/Orbits: No acute finding.

Other: None.
IMPRESSION: 1. Stable left paramedian frontal lobe resection cavity and left
frontal craniotomy postsurgical changes given differences in
technique.
2. No acute intracranial abnormality identified.

By: Giuliano Im M.D.

## 2018-12-29 ENCOUNTER — Observation Stay (HOSPITAL_COMMUNITY): Payer: No Typology Code available for payment source

## 2018-12-29 ENCOUNTER — Observation Stay (HOSPITAL_COMMUNITY)
Admission: EM | Admit: 2018-12-29 | Discharge: 2018-12-30 | Disposition: A | Discharge status: Home / Self Care | Payer: No Typology Code available for payment source | Attending: Internal Medicine | Admitting: Internal Medicine

## 2018-12-29 ENCOUNTER — Emergency Department (HOSPITAL_COMMUNITY): Payer: No Typology Code available for payment source

## 2018-12-29 ENCOUNTER — Encounter (HOSPITAL_COMMUNITY): Payer: Self-pay | Admitting: Emergency Medicine

## 2018-12-29 DIAGNOSIS — Z86711 Personal history of pulmonary embolism: Secondary | ICD-10-CM | POA: Diagnosis not present

## 2018-12-29 DIAGNOSIS — G9341 Metabolic encephalopathy: Secondary | ICD-10-CM | POA: Diagnosis not present

## 2018-12-29 DIAGNOSIS — R4182 Altered mental status, unspecified: Secondary | ICD-10-CM | POA: Diagnosis present

## 2018-12-29 DIAGNOSIS — R569 Unspecified convulsions: Secondary | ICD-10-CM | POA: Insufficient documentation

## 2018-12-29 DIAGNOSIS — I2699 Other pulmonary embolism without acute cor pulmonale: Secondary | ICD-10-CM

## 2018-12-29 DIAGNOSIS — R404 Transient alteration of awareness: Secondary | ICD-10-CM

## 2018-12-29 DIAGNOSIS — R4701 Aphasia: Secondary | ICD-10-CM | POA: Diagnosis not present

## 2018-12-29 DIAGNOSIS — C711 Malignant neoplasm of frontal lobe: Secondary | ICD-10-CM | POA: Diagnosis not present

## 2018-12-29 DIAGNOSIS — Z7901 Long term (current) use of anticoagulants: Secondary | ICD-10-CM | POA: Insufficient documentation

## 2018-12-29 DIAGNOSIS — Z79899 Other long term (current) drug therapy: Secondary | ICD-10-CM | POA: Diagnosis not present

## 2018-12-29 DIAGNOSIS — Z20828 Contact with and (suspected) exposure to other viral communicable diseases: Secondary | ICD-10-CM | POA: Diagnosis not present

## 2018-12-29 DIAGNOSIS — Z85841 Personal history of malignant neoplasm of brain: Secondary | ICD-10-CM | POA: Insufficient documentation

## 2018-12-29 LAB — CBC
HCT: 43 % (ref 39.0–52.0)
Hemoglobin: 14.3 g/dL (ref 13.0–17.0)
MCH: 29.8 pg (ref 26.0–34.0)
MCHC: 33.3 g/dL (ref 30.0–36.0)
MCV: 89.6 fL (ref 80.0–100.0)
Platelets: 195 10*3/uL (ref 150–400)
RBC: 4.8 MIL/uL (ref 4.22–5.81)
RDW: 12.5 % (ref 11.5–15.5)
WBC: 7.5 10*3/uL (ref 4.0–10.5)
nRBC: 0 % (ref 0.0–0.2)

## 2018-12-29 LAB — COMPREHENSIVE METABOLIC PANEL
ALT: 12 U/L (ref 0–44)
AST: 20 U/L (ref 15–41)
Albumin: 4.4 g/dL (ref 3.5–5.0)
Alkaline Phosphatase: 81 U/L (ref 38–126)
Anion gap: 12 (ref 5–15)
BUN: 10 mg/dL (ref 8–23)
CO2: 24 mmol/L (ref 22–32)
Calcium: 9.7 mg/dL (ref 8.9–10.3)
Chloride: 104 mmol/L (ref 98–111)
Creatinine, Ser: 1.11 mg/dL (ref 0.61–1.24)
GFR calc Af Amer: >60 mL/min (ref >60)
GFR calc non Af Amer: >60 mL/min (ref >60)
Glucose, Bld: 122 mg/dL — ABNORMAL HIGH (ref 70–99)
Potassium: 3.9 mmol/L (ref 3.5–5.1)
Sodium: 140 mmol/L (ref 135–145)
Total Bilirubin: 0.9 mg/dL (ref 0.3–1.2)
Total Protein: 7.1 g/dL (ref 6.5–8.1)

## 2018-12-29 LAB — I-STAT CHEM 8, ED
BUN: 12 mg/dL (ref 8–23)
Calcium, Ion: 1.12 mmol/L — ABNORMAL LOW (ref 1.15–1.40)
Chloride: 105 mmol/L (ref 98–111)
Creatinine, Ser: 1 mg/dL (ref 0.61–1.24)
Glucose, Bld: 117 mg/dL — ABNORMAL HIGH (ref 70–99)
HCT: 43 % (ref 39.0–52.0)
Hemoglobin: 14.6 g/dL (ref 13.0–17.0)
Potassium: 3.8 mmol/L (ref 3.5–5.1)
Sodium: 139 mmol/L (ref 135–145)
TCO2: 25 mmol/L (ref 22–32)

## 2018-12-29 LAB — DIFFERENTIAL
Abs Immature Granulocytes: 0.02 10*3/uL (ref 0.00–0.07)
Basophils Absolute: 0 10*3/uL (ref 0.0–0.1)
Basophils Relative: 1 %
Eosinophils Absolute: 0.1 10*3/uL (ref 0.0–0.5)
Eosinophils Relative: 1 %
Immature Granulocytes: 0 %
Lymphocytes Relative: 48 %
Lymphs Abs: 3.7 10*3/uL (ref 0.7–4.0)
Monocytes Absolute: 0.6 10*3/uL (ref 0.1–1.0)
Monocytes Relative: 9 %
Neutro Abs: 3.1 10*3/uL (ref 1.7–7.7)
Neutrophils Relative %: 41 %

## 2018-12-29 LAB — I-STAT CREATININE, ED: Creatinine, Ser: 1 mg/dL (ref 0.61–1.24)

## 2018-12-29 LAB — SARS CORONAVIRUS 2: SARS Coronavirus 2: NOT DETECTED

## 2018-12-29 LAB — APTT: aPTT: 29 seconds (ref 24–36)

## 2018-12-29 LAB — PROTIME-INR
INR: 1.1 (ref 0.8–1.2)
Prothrombin Time: 14.4 seconds (ref 11.4–15.2)

## 2018-12-29 LAB — CBG MONITORING, ED: Glucose-Capillary: 111 mg/dL — ABNORMAL HIGH (ref 70–99)

## 2018-12-29 MED ORDER — LEVETIRACETAM 500 MG PO TABS: 1000.0000 mg | ORAL_TABLET | Freq: Two times a day (BID) | ORAL | Status: DC

## 2018-12-29 MED ORDER — APIXABAN 5 MG PO TABS
5.0000 mg | ORAL_TABLET | Freq: Two times a day (BID) | ORAL | Status: DC
  Administered 2018-12-29 – 2018-12-30 (×3): 5 mg via ORAL
  Filled 2018-12-29 (×3): qty 1

## 2018-12-29 MED ORDER — STROKE: EARLY STAGES OF RECOVERY BOOK
Freq: Once | Status: AC
  Administered 2018-12-29: 12:00:00
  Filled 2018-12-29: qty 1

## 2018-12-29 MED ORDER — SENNOSIDES-DOCUSATE SODIUM 8.6-50 MG PO TABS: 1.0000 | ORAL_TABLET | Freq: Every evening | ORAL | Status: DC | PRN

## 2018-12-29 MED ORDER — IOHEXOL 350 MG/ML SOLN
115.0000 mL | Freq: Once | INTRAVENOUS | Status: AC | PRN
  Administered 2018-12-29: 04:00:00 115 mL via INTRAVENOUS

## 2018-12-29 MED ORDER — LEVETIRACETAM ER 500 MG PO TB24
1000.0000 mg | ORAL_TABLET | Freq: Two times a day (BID) | ORAL | Status: DC
  Administered 2018-12-29 (×2): 1000 mg via ORAL
  Filled 2018-12-29 (×3): qty 2

## 2018-12-29 MED ORDER — ACETAMINOPHEN 160 MG/5ML PO SOLN: 650.0000 mg | ORAL | Status: DC | PRN

## 2018-12-29 MED ORDER — GADOBUTROL 1 MMOL/ML IV SOLN
9.0000 mL | Freq: Once | INTRAVENOUS | Status: AC | PRN
  Administered 2018-12-29: 9 mL via INTRAVENOUS

## 2018-12-29 MED ORDER — ACETAMINOPHEN 325 MG PO TABS: 650.0000 mg | ORAL_TABLET | ORAL | Status: DC | PRN

## 2018-12-29 MED ORDER — SODIUM CHLORIDE 0.9 % IV SOLN
2000.0000 mg | INTRAVENOUS | Status: AC
  Administered 2018-12-29: 2000 mg via INTRAVENOUS
  Filled 2018-12-29: qty 20

## 2018-12-29 MED ORDER — SODIUM CHLORIDE 0.9% FLUSH: 3.0000 mL | Freq: Once | INTRAVENOUS | Status: DC

## 2018-12-29 MED ORDER — LORAZEPAM 2 MG/ML IJ SOLN: 1.0000 mg | INTRAMUSCULAR | Status: DC | PRN

## 2018-12-29 MED ORDER — ACETAMINOPHEN 650 MG RE SUPP: 650.0000 mg | RECTAL | Status: DC | PRN

## 2018-12-29 NOTE — Evaluation (Signed)
Speech Language Pathology Evaluation Patient Details Name: Christopher Morris MRN: [id number] DOB: April 21, 1941 Today's Date: 12/29/2018 Time: 3220-2542 SLP Time Calculation (min) (ACUTE ONLY): 25 min  Problem List:  Patient Active Problem List   Diagnosis Date Noted  . Acute metabolic encephalopathy 70/62/3762  . Aphasia 12/29/2018  . Pneumonia 12/22/2017  . HCAP (healthcare-associated pneumonia) 12/22/2017  . Pulmonary embolism ([place]) 12/22/2017  . Tachycardia   . Vitamin B12 deficiency 11/14/2017  . Anemia 11/14/2017  . Fever 11/13/2017  . Dehydration 11/13/2017  . Interstitial pneumonia ([place]) 11/11/2017  . Sepsis ([place]) 11/09/2017  . Bacteremia due to Gram-negative bacteria 10/26/2017  . Sepsis due to Escherichia coli (E. coli) ([place]) 10/26/2017  . UTI (urinary tract infection) 10/25/2017  . Seizure ([place]) 09/25/2017  . Frontal glioblastoma multiforme ([place]) 09/25/2017  . Hyperglycemia 09/25/2017  . BLOOD IN STOOL 07/26/2008  . ERECTILE DYSFUNCTION 07/12/2008   Past Medical History:  Past Medical History:  Diagnosis Date  . Brain cancer ([place])   . Glioblastoma ([place]) 09/25/2017  . Hyperglycemia 09/25/2017  . Seizure ([place]) 09/25/2017   Past Surgical History:  Past Surgical History:  Procedure Laterality Date  . APPLICATION OF CRANIAL NAVIGATION N/A 09/30/2017   Procedure: APPLICATION OF CRANIAL NAVIGATION;  Surgeon: [name], MD;  Location: [facility];  Service: Neurosurgery;  Laterality: N/A;  . CRANIOTOMY N/A 09/30/2017   Procedure: CRANIOTOMY FOR RESECTION OF BRAIN TUMOR WITH STEREOTACTIC;  Surgeon: [name], MD;  Location: [facility];  Service: Neurosurgery;  Laterality: N/A;  CRANIOTOMY FOR RESECTION OF BRAIN TUMOR WITH STEREOTACTIC   HPI:  Patient is an 78 y.o. male with PMH: brain glioblastoma, s/p craniotomy with tumor resection, radiation and chemotherapy, PE on Eliquis, seizure, on Keppra, who presented with AMS, aphasia, right facial droop and right UE rigidity. At  baseline, patient is partially dependent for most of his ADL's per daughter's report. MR brain did not reveal any acute infarct or other acute intracranial abnormality; unchanged irregularly enhancing lesion in the left frontal lobe with unchanged extensive white matter T2 signal abnormality which could reflect a combination of edema and nonenhancing tumor.   Assessment / Plan / Recommendation Clinical Impression  Patient presents with a mild-moderate mixed receptive-expressive aphasia. He also exhibits ideational and motor apraxia including oral motor apraxia. Specific deficits included: divergent naming, confrontational naming, attention, awareness to deficits, basic level functional problem solving, verbal expression at phrase and sentence levels when describing/responding to open-ended questions and phrase and sentence level reading. He will benefit from skilled speech-language therapy to improve his ability to communicate wants/needs/thoughts, safely and effectively perform functional tasks and to follow basic level instructions/directions.    SLP Assessment  SLP Recommendation/Assessment: Patient needs continued Speech Lanaguage Pathology Services SLP Visit Diagnosis: Aphasia (R47.01)    Follow Up Recommendations  Inpatient Rehab;Skilled Nursing facility;24 hour supervision/assistance    Frequency and Duration min 2x/week  1 week      SLP Evaluation Cognition  Overall Cognitive Status: No family/caregiver present to determine baseline cognitive functioning Arousal/Alertness: Awake/alert Orientation Level: Disoriented to person;Oriented to place;Oriented to situation;Disoriented to time(difficult to differentiate between disorientation to time versus impact of aphasia on ability to name dates) Attention: Sustained;Selective Sustained Attention: Appears intact Selective Attention: Impaired Selective Attention Impairment: Verbal basic;Verbal complex Awareness: Impaired Awareness  Impairment: Intellectual impairment Problem Solving: Impaired Problem Solving Impairment: Functional basic;Verbal basic(impact from aphasia, apraxia) Executive Function: Self Monitoring Self Monitoring: Impaired Self Monitoring Impairment: Verbal basic;Functional basic Behaviors: Perseveration Safety/Judgment: Impaired       Comprehension  Auditory Comprehension Overall Auditory Comprehension: Impaired Yes/No Questions: Impaired Basic Biographical Questions: 76-100% accurate Basic Immediate Environment Questions: 50-74% accurate Commands: Impaired One Step Basic Commands: 75-100% accurate Two Step Basic Commands: 50-74% accurate Conversation: Simple Interfering Components: Attention;Processing speed;Motor planning EffectiveTechniques: Extra processing time;Repetition;Visual/Gestural cues Reading Comprehension Reading Status: Impaired(WFL for word and 2-3 word phrase only) Word level: Within functional limits Sentence Level: Impaired Paragraph Level: Not tested Functional Environmental (signs, name badge): Within functional limits Interfering Components: Attention;Visual acuity;Visual scanning;Processing time    Expression Expression Primary Mode of Expression: Verbal Verbal Expression Overall Verbal Expression: Impaired Initiation: No impairment Level of Generative/Spontaneous Verbalization: Word;Phrase Repetition: No impairment Naming: Impairment Responsive: 76-100% accurate Confrontation: Impaired Convergent: Not tested Divergent: 0-24% accurate Verbal Errors: Not aware of errors;Perseveration Pragmatics: Impairment Impairments: Abnormal affect;Interpretation of nonverbal communication;Monotone Interfering Components: Attention Effective Techniques: Semantic cues;Open ended questions Non-Verbal Means of Communication: Not applicable   Oral / Motor  Oral Motor/Sensory Function Overall Oral Motor/Sensory Function: Within functional limits Motor Speech Overall Motor  Speech: Impaired Respiration: Within functional limits Phonation: Normal Resonance: Within functional limits Articulation: Within functional limitis Intelligibility: Intelligible Motor Planning: Impaired Level of Impairment: Sentence Motor Speech Errors: Unaware Interfering Components: Premorbid status Effective Techniques: Slow rate   GO                    Dannial Monarch 12/29/2018, 7:31 PM    Sonia Baller, MA, CCC-SLP Speech Therapy [facility] Acute Rehab Pager: [phone]

## 2018-12-29 NOTE — Evaluation (Addendum)
Physical Therapy Evaluation Patient Details Name: Christopher Morris MRN: [id number] DOB: [birth year] Today's Date: 12/29/2018   History of Present Illness  Patient is a 78 y/o male who presents with acute onset of confusion and RUE posturing without volitional RUE movement. DD: stroke vs seizure. Head CT-unremarkable. Brain MRI- Unchanged irregularly enhancing lesion in the left frontal lobe with unchanged extensive white matter T2 signal abnormality could reflect a combination of edema and nonenhancing tumor. PMH includes glioblastoma s/p crani 09/2017 and radiation, acute PE, DVT, seizure.  Clinical Impression  Patient presents with right inattention, apraxia, impaired motor planning and impaired mobility s/p above.  Not sure the accuracy of pt's PLOF/history as pt with hx of glioblastoma. Pt reports being Mod I with ADLs but daughter does all IADls. Today, pt tolerated transfers and gait training with min guard-Min A for balance/safety. Pt running into things on right side on a few occasions. Noted to have marked cognitive deficits- difficult navigating hallway, reading signs etc. Also noted to have some mild apraxia. Difficulty following multi step commands consistently. Pt also HOH. Pt lives with daughter per report. Will need to see if daughter can provide necessary assist/supervision at home as well as determine pt's cognitive baseline. Recommend CIR vs HHPT pending pt's cognitive baseline.Will follow acutely to maximize independence and mobility prior to return home.     Follow Up Recommendations Home health PT;Supervision for mobility/OOB;Supervision/Assistance - 24 hour    Equipment Recommendations  None recommended by PT    Recommendations for Other Services       Precautions / Restrictions Precautions Precautions: Fall Precaution Comments: right inattention; apraxia Restrictions Weight Bearing Restrictions: No      Mobility  Bed Mobility Overal bed mobility: Needs Assistance Bed  Mobility: Supine to Sit;Sit to Supine     Supine to sit: Min guard;HOB elevated Sit to supine: Supervision   General bed mobility comments: Increased time as pt forgetting about RLE when trying to get to EOB. Able to return to supine with increased effort to bring LEs into bed.  Transfers Overall transfer level: Needs assistance Equipment used: 4-wheeled walker Transfers: Sit to/from Stand Sit to Stand: Min guard         General transfer comment: Min guard for safety as pt slightly impulsive with movement and difficulty following commands consistently. Stood from [employer].  Ambulation/Gait Ambulation/Gait assistance: Min guard Gait Distance (Feet): 150 Feet Assistive device: 4-wheeled walker Gait Pattern/deviations: Step-through pattern;Trunk flexed;Wide base of support     General Gait Details: Slow, mostly steady gait with rollator for support; running into a few things on right side. Not able to navigate hallway to find room or read sign correctly, needing cues. Not sure what is baseline?  Stairs            Wheelchair Mobility    Modified Rankin (Stroke Patients Only) Modified Rankin (Stroke Patients Only) Pre-Morbid Rankin Score: Moderately severe disability Modified Rankin: Moderately severe disability     Balance Overall balance assessment: Needs assistance Sitting-balance support: Feet supported;No upper extremity supported Sitting balance-Leahy Scale: Good     Standing balance support: During functional activity Standing balance-Leahy Scale: Fair Standing balance comment: Does better with UE support in standing.                             Pertinent Vitals/Pain Pain Assessment: No/denies pain    Home Living Family/patient expects to be discharged to:: Private residence Living Arrangements: Children Available  Help at Discharge: Family;Available PRN/intermittently Type of Home: House Home Access: Stairs to enter   Entrance Stairs-Number  of Steps: 1 Home Layout: One level Home Equipment: Walker - 4 wheels      Prior Function Level of Independence: Needs assistance   Gait / Transfers Assistance Needed: Uses rollator for ambulation  ADL's / Homemaking Assistance Needed: Does own ADLs and daughter does IADls.        Hand Dominance        Extremity/Trunk Assessment   Upper Extremity Assessment Upper Extremity Assessment: Defer to OT evaluation    Lower Extremity Assessment Lower Extremity Assessment: Overall WFL for tasks assessed(Reports sensation WFLs.)       Communication   Communication: Expressive difficulties  Cognition Arousal/Alertness: Awake/alert Behavior During Therapy: WFL for tasks assessed/performed Overall Cognitive Status: Impaired/Different from baseline Area of Impairment: Orientation;Attention;Following commands;Safety/judgement;Awareness;Problem solving                 Orientation Level: Situation;Time Current Attention Level: Sustained   Following Commands: Follows one step commands inconsistently;Follows one step commands with increased time;Follows multi-step commands inconsistently Safety/Judgement: Decreased awareness of safety;Decreased awareness of deficits Awareness: Intellectual Problem Solving: Difficulty sequencing;Requires verbal cues;Requires tactile cues;Slow processing General Comments: Decreased awareness of right side and right environment. Diffficulty dual tasking needing to stop and perform tasks. Apraxic. Able to name objects correctly with increased time. Not able to read clock with correct time. Not sure of baseline cognition? Able to state months of year with cues. Difficulty navigating hallway and reading signs.      General Comments General comments (skin integrity, edema, etc.): VSS throughout. Of note, pt HOH.    Exercises     Assessment/Plan    PT Assessment Patient needs continued PT services  PT Problem List Decreased mobility;Decreased safety  awareness;Decreased cognition       PT Treatment Interventions Therapeutic activities;Cognitive remediation;Gait training;Therapeutic exercise;Stair training;Balance training;Functional mobility training;Neuromuscular re-education;Patient/family education    PT Goals (Current goals can be found in the Care Plan section)  Acute Rehab PT Goals Patient Stated Goal: none stated PT Goal Formulation: With patient Time For Goal Achievement: 01/12/19 Potential to Achieve Goals: Fair    Frequency Min 4X/week   Barriers to discharge   not sure of level of support at home    Co-evaluation               AM-PAC PT "6 Clicks" Mobility  Outcome Measure Help needed turning from your back to your side while in a flat bed without using bedrails?: A Little Help needed moving from lying on your back to sitting on the side of a flat bed without using bedrails?: A Little Help needed moving to and from a bed to a chair (including a wheelchair)?: A Little Help needed standing up from a chair using your arms (e.g., wheelchair or bedside chair)?: A Little Help needed to walk in hospital room?: A Little Help needed climbing 3-5 steps with a railing? : A Little 6 Click Score: 18    End of Session Equipment Utilized During Treatment: Gait belt Activity Tolerance: Patient tolerated treatment well Patient left: in bed;with call bell/phone within reach;with bed alarm set Nurse Communication: Mobility status PT Visit Diagnosis: Difficulty in walking, not elsewhere classified (R26.2);Apraxia (R48.2)    Time: 5329-9242 PT Time Calculation (min) (ACUTE ONLY): 21 min   Charges:   PT Evaluation $PT Eval Moderate Complexity: 1 Mod          Wray Kearns, PT, DPT  Acute Rehabilitation Services Pager [phone] Office [place] 12/29/2018, 12:20 PM

## 2018-12-29 NOTE — Plan of Care (Signed)
progressing 

## 2018-12-29 NOTE — H&P (Signed)
History and Physical    Christopher Morris XBW:[id number] DOB: [birth year] DOA: 12/29/2018  Referring MD/NP/PA:   PCP: [facility]   Patient coming from:  The patient is coming from home.  At baseline, pt is partially dependent for most of ADL.        Chief Complaint: AMS, aphasia, right facial droop, right upper arm rigidity  HPI: Christopher Morris is a 78 y.o. male with medical history significant of brain glioblastoma (s/p Craniotomy with tumor resection, radiation and chemotherapy), PE on Eliquis, seizure on Keppra, who presents with altered mental status, aphasia, right facial droop and right upper arm rigidity.  Patient has difficulty speaking and altered mental status, cannot provide accurate medical history.  I called her daughter, who helped collecting the medical history.   Per his daughter, patient was noted to be confused and fell in bathroom at about 3:00 AM  He has difficulty speaking and was noted to have right facial droop.  Does not seem to have vision loss or hearing loss.  His right arm was rigid and posturing. Pt seems to have had tremor-like movements. Per her daughter, patient does not seem to have chest pain, shortness of breath, cough, fever or chills.  No nausea vomiting or diarrhea or abdominal pain.  ED Course: pt was found to have WBC 7.5, INR 1.01, PTT 29, electrolytes renal function okay, temperature normal, blood pressure 131/75, heart rate 92, oxygen saturation 94 to 99% on room air, CT head showed no acute issues.  Pending CT angiogram and brain perfusion test.  Patient is placed on telemetry bed for observation.  Neurology, Dr. Cheral Marker was consulted.  Review of Systems: Could not be reviewed accurately due to altered mental status  Allergy:  Allergies  Allergen Reactions   Pork-Derived Products     Patient does not eat pork   Shellfish Allergy     Patient does not eat shellfish    Past Medical History:  Diagnosis Date   Brain cancer ([place])     Glioblastoma ([place]) 09/25/2017   Hyperglycemia 09/25/2017   Seizure ([place]) 09/25/2017    Past Surgical History:  Procedure Laterality Date   APPLICATION OF CRANIAL NAVIGATION N/A 09/30/2017   Procedure: APPLICATION OF CRANIAL NAVIGATION;  Surgeon: [name], MD;  Location: [facility];  Service: Neurosurgery;  Laterality: N/A;   CRANIOTOMY N/A 09/30/2017   Procedure: CRANIOTOMY FOR RESECTION OF BRAIN TUMOR WITH STEREOTACTIC;  Surgeon: [name], MD;  Location: [place];  Service: Neurosurgery;  Laterality: N/A;  CRANIOTOMY FOR RESECTION OF BRAIN TUMOR WITH STEREOTACTIC    Social History:  reports that he has never smoked. He has never used smokeless tobacco. He reports that he does not drink alcohol or use drugs.  Family History:  Family History  Problem Relation Age of Onset   Hypertension Daughter    Stroke Mother    Cancer Neg Hx      Prior to Admission medications   Medication Sig Start Date End Date Taking? Authorizing Provider  apixaban (ELIQUIS) 5 MG TABS tablet Take 10 mg BID till 12/24/2017, start taking 5 mg BID from 12/25/2017 12/20/17   Lavina Hamman, MD  levETIRAcetam (KEPPRA) 1000 MG tablet Take 1 tablet (1,000 mg total) by mouth 2 (two) times daily. 11/26/17   Vaslow, Acey Lav, MD  ondansetron (ZOFRAN) 8 MG tablet Take 1 tablet (8 mg total) by mouth 2 (two) times daily as needed. Start on the third day after chemotherapy. Patient not taking: Reported on 07/31/2018 06/02/18  Ventura Sellers, MD  temozolomide (TEMODAR) 140 MG capsule Take 2 capsules (280 mg total) by mouth daily. May take on an empty stomach to decrease nausea & vomiting. Patient not taking: Reported on 07/31/2018 03/03/18   Ventura Sellers, MD  temozolomide (TEMODAR) 140 MG capsule Take 2 capsules (280 mg total) by mouth daily. May take on an empty stomach to decrease nausea & vomiting. Patient not taking: Reported on 07/31/2018 05/21/18   Ventura Sellers, MD  temozolomide (TEMODAR) 140 MG  capsule Take 2 capsules (280 mg total) by mouth daily. May take on an empty stomach to decrease nausea & vomiting. Patient not taking: Reported on 07/31/2018 05/27/18   Ventura Sellers, MD  temozolomide (TEMODAR) 140 MG capsule Take 2 capsules (280 mg total) by mouth daily. May take on an empty stomach to decrease nausea & vomiting. Patient not taking: Reported on 07/31/2018 06/18/18   Ventura Sellers, MD  temozolomide (TEMODAR) 140 MG capsule Take 2 capsules (280 mg total) by mouth daily. May take on an empty stomach to decrease nausea & vomiting. Patient not taking: Reported on 08/28/2018 07/31/18   Ventura Sellers, MD  temozolomide (TEMODAR) 140 MG capsule Take 2 capsules (280 mg total) by mouth daily. May take on an empty stomach to decrease nausea & vomiting. Patient not taking: Reported on 11/17/2018 08/28/18   Ventura Sellers, MD  temozolomide (TEMODAR) 140 MG capsule Take 2 capsules (280 mg total) by mouth daily. May take on an empty stomach to decrease nausea & vomiting. Patient not taking: Reported on 11/17/2018 09/09/18   Ventura Sellers, MD  temozolomide (TEMODAR) 140 MG capsule Take 2 capsules (280 mg total) by mouth daily. May take on an empty stomach to decrease nausea & vomiting. Patient not taking: Reported on 11/17/2018 10/09/18   Ventura Sellers, MD  temozolomide (TEMODAR) 140 MG capsule Take 2 capsules (280 mg total) by mouth daily. May take on an empty stomach to decrease nausea & vomiting. Patient not taking: Reported on 11/17/2018 10/24/18   Ventura Sellers, MD  temozolomide (TEMODAR) 20 MG capsule Take 2 capsules (40 mg total) by mouth daily. May take on an empty stomach to decrease nausea & vomiting. Patient not taking: Reported on 07/31/2018 03/03/18   Ventura Sellers, MD  temozolomide (TEMODAR) 20 MG capsule Take 2 capsules (40 mg total) by mouth daily. May take on an empty stomach to decrease nausea & vomiting. Patient not taking: Reported on 07/31/2018 05/21/18   Ventura Sellers, MD  temozolomide (TEMODAR) 20 MG capsule Take 2 capsules (40 mg total) by mouth daily. May take on an empty stomach to decrease nausea & vomiting. Patient not taking: Reported on 07/31/2018 05/27/18   Ventura Sellers, MD  temozolomide (TEMODAR) 20 MG capsule Take 2 capsules (40 mg total) by mouth daily. May take on an empty stomach to decrease nausea & vomiting. Patient not taking: Reported on 07/31/2018 06/18/18   Ventura Sellers, MD  temozolomide (TEMODAR) 20 MG capsule Take 2 capsules (40 mg total) by mouth daily. May take on an empty stomach to decrease nausea & vomiting. Patient not taking: Reported on 08/28/2018 07/31/18   Ventura Sellers, MD  temozolomide (TEMODAR) 20 MG capsule Take 2 capsules (40 mg total) by mouth daily. May take on an empty stomach to decrease nausea & vomiting. Patient not taking: Reported on 11/17/2018 08/28/18   Ventura Sellers, MD  temozolomide (TEMODAR) 20 MG capsule Take 2 capsules (  40 mg total) by mouth daily. May take on an empty stomach to decrease nausea & vomiting. Patient not taking: Reported on 11/17/2018 09/09/18   Ventura Sellers, MD  temozolomide (TEMODAR) 20 MG capsule Take 2 capsules (40 mg total) by mouth daily. May take on an empty stomach to decrease nausea & vomiting. Patient not taking: Reported on 11/17/2018 10/09/18   Ventura Sellers, MD  temozolomide (TEMODAR) 20 MG capsule Take 2 capsules (40 mg total) by mouth daily. May take on an empty stomach to decrease nausea & vomiting. Patient not taking: Reported on 11/17/2018 10/24/18   Ventura Sellers, MD    Physical Exam: Vitals:   12/29/18 0500 12/29/18 0515 12/29/18 0530 12/29/18 0545  BP: 138/67 135/70 127/65 124/66  Pulse: 85 82 81 83  Resp: 19 17 15 15   Temp:      TempSrc:      SpO2: 96% 95% 93% 94%  Weight:      Height:       General: Not in acute distress HEENT:       Eyes: PERRL, EOMI, no scleral icterus.       ENT: No discharge from the ears and nose, no pharynx  injection, no tonsillar enlargement.        Neck: No JVD, no bruit, no mass felt. Heme: No neck lymph node enlargement. Cardiac: S1/S2, RRR, No murmurs, No gallops or rubs. Respiratory: No rales, wheezing, rhonchi or rubs. GI: Soft, nondistended, nontender, no organomegaly, BS present. GU: No hematuria Ext: No pitting leg edema bilaterally. 2+DP/PT pulse bilaterally. Musculoskeletal: No joint deformities, No joint redness or warmth, no limitation of ROM in spin. Skin: No rashes.  Neuro: confused, not oriented X3, has difficult speaking. Cranial nerves II-XII grossly intact. Right arm is rigid. Muscle strength 5/5 in other extremities. Negative Babinski's sign. Psych: Patient is not psychotic, no suicidal or hemocidal ideation.  Labs on Admission: I have personally reviewed following labs and imaging studies  CBC: Recent Labs  Lab 12/29/18 0335 12/29/18 0346  WBC 7.5  --   NEUTROABS 3.1  --   HGB 14.3 14.6  HCT 43.0 43.0  MCV 89.6  --   PLT 195  --    Basic Metabolic Panel: Recent Labs  Lab 12/29/18 0335 12/29/18 0346 12/29/18 0356  NA 140 139  --   K 3.9 3.8  --   CL 104 105  --   CO2 24  --   --   GLUCOSE 122* 117*  --   BUN 10 12  --   CREATININE 1.11 1.00 1.00  CALCIUM 9.7  --   --    GFR: Estimated Creatinine Clearance: 68.4 mL/min (by C-G formula based on SCr of 1 mg/dL). Liver Function Tests: Recent Labs  Lab 12/29/18 0335  AST 20  ALT 12  ALKPHOS 81  BILITOT 0.9  PROT 7.1  ALBUMIN 4.4   No results for input(s): LIPASE, AMYLASE in the last 168 hours. No results for input(s): AMMONIA in the last 168 hours. Coagulation Profile: Recent Labs  Lab 12/29/18 0335  INR 1.1   Cardiac Enzymes: No results for input(s): CKTOTAL, CKMB, CKMBINDEX, TROPONINI in the last 168 hours. BNP (last 3 results) No results for input(s): PROBNP in the last 8760 hours. HbA1C: No results for input(s): HGBA1C in the last 72 hours. CBG: Recent Labs  Lab 12/29/18 0332   GLUCAP 111*   Lipid Profile: No results for input(s): CHOL, HDL, LDLCALC, TRIG, CHOLHDL, LDLDIRECT in the  last 72 hours. Thyroid Function Tests: No results for input(s): TSH, T4TOTAL, FREET4, T3FREE, THYROIDAB in the last 72 hours. Anemia Panel: No results for input(s): VITAMINB12, FOLATE, FERRITIN, TIBC, IRON, RETICCTPCT in the last 72 hours. Urine analysis:    Component Value Date/Time   COLORURINE YELLOW 12/18/2017 0227   APPEARANCEUR CLEAR 12/18/2017 0227   LABSPEC 1.012 12/18/2017 0227   PHURINE 5.0 12/18/2017 0227   GLUCOSEU NEGATIVE 12/18/2017 0227   HGBUR NEGATIVE 12/18/2017 0227   BILIRUBINUR NEGATIVE 12/18/2017 0227   KETONESUR NEGATIVE 12/18/2017 0227   PROTEINUR NEGATIVE 12/18/2017 0227   NITRITE NEGATIVE 12/18/2017 0227   LEUKOCYTESUR NEGATIVE 12/18/2017 0227   Sepsis Labs: @LABRCNTIP (procalcitonin:4,lacticidven:4) )No results found for this or any previous visit (from the past 240 hour(s)).   Radiological Exams on Admission: Ct Code Stroke Cta Head W/wo Contrast  Result Date: 12/29/2018 CLINICAL DATA:  Right facial droop.  History of glioblastoma. EXAM: CT ANGIOGRAPHY HEAD AND NECK CT PERFUSION BRAIN TECHNIQUE: Multidetector CT imaging of the head and neck was performed using the standard protocol during bolus administration of intravenous contrast. Multiplanar CT image reconstructions and MIPs were obtained to evaluate the vascular anatomy. Carotid stenosis measurements (when applicable) are obtained utilizing NASCET criteria, using the distal internal carotid diameter as the denominator. Multiphase CT imaging of the brain was performed following IV bolus contrast injection. Subsequent parametric perfusion maps were calculated using RAPID software. CONTRAST:  175mL OMNIPAQUE IOHEXOL 350 MG/ML SOLN COMPARISON:  Head CT 12/29/2018 and MRI 11/17/2018. FINDINGS: CTA NECK FINDINGS Aortic arch: Standard 3 vessel aortic arch with mild atherosclerotic plaque. Widely patent arch  vessel origins. Right carotid system: Patent without evidence of stenosis or dissection. Minimal atherosclerotic plaque in the carotid bulb. Retropharyngeal course of the distal common and proximal internal carotid arteries. Left carotid system: Patent without evidence of stenosis or dissection. Partially retropharyngeal course of the distal common and proximal external carotid arteries. Vertebral arteries: Patent and codominant without evidence of stenosis or dissection. Skeleton: Advanced diffuse cervical disc degeneration. Other neck: No evidence of cervical lymphadenopathy or mass. Upper chest: Clear lung apices. Review of the MIP images confirms the above findings CTA HEAD FINDINGS Anterior circulation: The internal carotid arteries are patent from skull base to carotid termini with nonstenotic plaque more notable on the left. ACAs and MCAs are patent with mild branch vessel irregularity but no evidence of proximal branch occlusion or significant proximal stenosis. No aneurysm is identified. Posterior circulation: The intracranial vertebral arteries are patent to the basilar without significant stenosis. Patent right PICA, left AICA, and bilateral SCA origins are identified. The basilar artery is widely patent. Posterior communicating arteries are diminutive or absent. PCAs are patent with moderate P2 stenoses bilaterally. No aneurysm is identified. Venous sinuses: As permitted by contrast timing, patent. Anatomic variants: None. Review of the MIP images confirms the above findings CT Brain Perfusion Findings: ASPECTS: 10 CBF (<30%) Volume: 71mL Perfusion (Tmax>6.0s) volume: 91mL Mismatch Volume: 60mL Infarction Location:Left cerebral hemisphere primarily involving frontal and parietal lobes in the MCA territory as well as possibly some ACA involvement. This perfusion abnormality is predominantly within subcortical white matter with scattered areas of cortical involvement and partially corresponds to the  patient's known glioblastoma and white matter changes on MRI. IMPRESSION: 1. No emergent large vessel occlusion. 2. Moderate bilateral P2 stenoses. 3. No significant proximal stenosis in the anterior intracranial circulation. 4. Widely patent cervical carotid and vertebral arteries. 5. Altered perfusion in the left cerebral hemisphere particularly involving frontoparietal subcortical white matter.  While this may reflect an acute infarct with small amount of penumbra, the patient's known underlying glioblastoma and edema/post treatment changes may also be contributing to the altered perfusion. 6.  Aortic Atherosclerosis (ICD10-I70.0). These results were called by telephone at the time of interpretation on 12/29/2018 at 4:20 am to Dr. Kerney Elbe , who verbally acknowledged these results. Electronically Signed   By: Logan Bores M.D.   On: 12/29/2018 04:29   Ct Code Stroke Cta Neck W/wo Contrast  Result Date: 12/29/2018 CLINICAL DATA:  Right facial droop.  History of glioblastoma. EXAM: CT ANGIOGRAPHY HEAD AND NECK CT PERFUSION BRAIN TECHNIQUE: Multidetector CT imaging of the head and neck was performed using the standard protocol during bolus administration of intravenous contrast. Multiplanar CT image reconstructions and MIPs were obtained to evaluate the vascular anatomy. Carotid stenosis measurements (when applicable) are obtained utilizing NASCET criteria, using the distal internal carotid diameter as the denominator. Multiphase CT imaging of the brain was performed following IV bolus contrast injection. Subsequent parametric perfusion maps were calculated using RAPID software. CONTRAST:  124mL OMNIPAQUE IOHEXOL 350 MG/ML SOLN COMPARISON:  Head CT 12/29/2018 and MRI 11/17/2018. FINDINGS: CTA NECK FINDINGS Aortic arch: Standard 3 vessel aortic arch with mild atherosclerotic plaque. Widely patent arch vessel origins. Right carotid system: Patent without evidence of stenosis or dissection. Minimal  atherosclerotic plaque in the carotid bulb. Retropharyngeal course of the distal common and proximal internal carotid arteries. Left carotid system: Patent without evidence of stenosis or dissection. Partially retropharyngeal course of the distal common and proximal external carotid arteries. Vertebral arteries: Patent and codominant without evidence of stenosis or dissection. Skeleton: Advanced diffuse cervical disc degeneration. Other neck: No evidence of cervical lymphadenopathy or mass. Upper chest: Clear lung apices. Review of the MIP images confirms the above findings CTA HEAD FINDINGS Anterior circulation: The internal carotid arteries are patent from skull base to carotid termini with nonstenotic plaque more notable on the left. ACAs and MCAs are patent with mild branch vessel irregularity but no evidence of proximal branch occlusion or significant proximal stenosis. No aneurysm is identified. Posterior circulation: The intracranial vertebral arteries are patent to the basilar without significant stenosis. Patent right PICA, left AICA, and bilateral SCA origins are identified. The basilar artery is widely patent. Posterior communicating arteries are diminutive or absent. PCAs are patent with moderate P2 stenoses bilaterally. No aneurysm is identified. Venous sinuses: As permitted by contrast timing, patent. Anatomic variants: None. Review of the MIP images confirms the above findings CT Brain Perfusion Findings: ASPECTS: 10 CBF (<30%) Volume: 54mL Perfusion (Tmax>6.0s) volume: 71mL Mismatch Volume: 2mL Infarction Location:Left cerebral hemisphere primarily involving frontal and parietal lobes in the MCA territory as well as possibly some ACA involvement. This perfusion abnormality is predominantly within subcortical white matter with scattered areas of cortical involvement and partially corresponds to the patient's known glioblastoma and white matter changes on MRI. IMPRESSION: 1. No emergent large vessel  occlusion. 2. Moderate bilateral P2 stenoses. 3. No significant proximal stenosis in the anterior intracranial circulation. 4. Widely patent cervical carotid and vertebral arteries. 5. Altered perfusion in the left cerebral hemisphere particularly involving frontoparietal subcortical white matter. While this may reflect an acute infarct with small amount of penumbra, the patient's known underlying glioblastoma and edema/post treatment changes may also be contributing to the altered perfusion. 6.  Aortic Atherosclerosis (ICD10-I70.0). These results were called by telephone at the time of interpretation on 12/29/2018 at 4:20 am to Dr. Kerney Elbe , who verbally acknowledged these results.  Electronically Signed   By: Logan Bores M.D.   On: 12/29/2018 04:29   [name] AL Contrast  Result Date: 12/29/2018 CLINICAL DATA:  Fall with altered mental status, aphasia, and right upper extremity posturing. History of glioblastoma. EXAM: MRI HEAD WITHOUT AND WITH CONTRAST TECHNIQUE: Multiplanar, multiecho pulse sequences of the brain and surrounding structures were obtained without and with intravenous contrast. CONTRAST:  9 mL Gadavist COMPARISON:  Head CT, CTA, and CTP 12/29/2018.  Head MRI 11/17/2018. FINDINGS: Brain: There is no acute infarct, midline shift, or extra-axial fluid collection. There is moderate cerebral atrophy. Sequelae of left frontal craniotomy and tumor resection are again identified with chronic blood products at the left frontal resection site. Extensive nonenhancing T2 hyperintensity involving the left greater than right cerebral white matter is unchanged with mild left-sided sulcal effacement and mild mass effect on the left lateral ventricle again noted. Irregular enhancement at the left frontal resection site extending to the superior margin of the left lateral ventricle has not significantly changed in extent, with a maximal diameter of 3.4 cm on axial images. A small focus of restricted  diffusion along the posterior aspect of the enhancement is stable to slightly decreased. No abnormal enhancement is identified remote from the resection site. Vascular: Major intracranial vascular flow voids are preserved. Skull and upper cervical spine: Right frontal craniotomy. No suspicious marrow lesion. Advanced upper cervical disc and facet degeneration. Sinuses/Orbits: Unremarkable orbits. Paranasal sinuses and mastoid air cells are clear. Other: Unchanged 7 mm cystic focus in the left parotid gland. IMPRESSION: 1. No acute infarct or other acute intracranial abnormality. 2. Unchanged irregularly enhancing lesion in the left frontal lobe with unchanged extensive white matter T2 signal abnormality which could reflect a combination of edema and nonenhancing tumor. Electronically Signed   By: Logan Bores M.D.   On: 12/29/2018 07:26   Ct Code Stroke Cta Cerebral Perfusion W/wo Contrast  Result Date: 12/29/2018 CLINICAL DATA:  Right facial droop.  History of glioblastoma. EXAM: CT ANGIOGRAPHY HEAD AND NECK CT PERFUSION BRAIN TECHNIQUE: Multidetector CT imaging of the head and neck was performed using the standard protocol during bolus administration of intravenous contrast. Multiplanar CT image reconstructions and MIPs were obtained to evaluate the vascular anatomy. Carotid stenosis measurements (when applicable) are obtained utilizing NASCET criteria, using the distal internal carotid diameter as the denominator. Multiphase CT imaging of the brain was performed following IV bolus contrast injection. Subsequent parametric perfusion maps were calculated using RAPID software. CONTRAST:  130mL OMNIPAQUE IOHEXOL 350 MG/ML SOLN COMPARISON:  Head CT 12/29/2018 and MRI 11/17/2018. FINDINGS: CTA NECK FINDINGS Aortic arch: Standard 3 vessel aortic arch with mild atherosclerotic plaque. Widely patent arch vessel origins. Right carotid system: Patent without evidence of stenosis or dissection. Minimal atherosclerotic  plaque in the carotid bulb. Retropharyngeal course of the distal common and proximal internal carotid arteries. Left carotid system: Patent without evidence of stenosis or dissection. Partially retropharyngeal course of the distal common and proximal external carotid arteries. Vertebral arteries: Patent and codominant without evidence of stenosis or dissection. Skeleton: Advanced diffuse cervical disc degeneration. Other neck: No evidence of cervical lymphadenopathy or mass. Upper chest: Clear lung apices. Review of the MIP images confirms the above findings CTA HEAD FINDINGS Anterior circulation: The internal carotid arteries are patent from skull base to carotid termini with nonstenotic plaque more notable on the left. ACAs and MCAs are patent with mild branch vessel irregularity but no evidence of proximal branch occlusion or significant proximal stenosis. No  aneurysm is identified. Posterior circulation: The intracranial vertebral arteries are patent to the basilar without significant stenosis. Patent right PICA, left AICA, and bilateral SCA origins are identified. The basilar artery is widely patent. Posterior communicating arteries are diminutive or absent. PCAs are patent with moderate P2 stenoses bilaterally. No aneurysm is identified. Venous sinuses: As permitted by contrast timing, patent. Anatomic variants: None. Review of the MIP images confirms the above findings CT Brain Perfusion Findings: ASPECTS: 10 CBF (<30%) Volume: 20mL Perfusion (Tmax>6.0s) volume: 76mL Mismatch Volume: 73mL Infarction Location:Left cerebral hemisphere primarily involving frontal and parietal lobes in the MCA territory as well as possibly some ACA involvement. This perfusion abnormality is predominantly within subcortical white matter with scattered areas of cortical involvement and partially corresponds to the patient's known glioblastoma and white matter changes on MRI. IMPRESSION: 1. No emergent large vessel occlusion. 2.  Moderate bilateral P2 stenoses. 3. No significant proximal stenosis in the anterior intracranial circulation. 4. Widely patent cervical carotid and vertebral arteries. 5. Altered perfusion in the left cerebral hemisphere particularly involving frontoparietal subcortical white matter. While this may reflect an acute infarct with small amount of penumbra, the patient's known underlying glioblastoma and edema/post treatment changes may also be contributing to the altered perfusion. 6.  Aortic Atherosclerosis (ICD10-I70.0). These results were called by telephone at the time of interpretation on 12/29/2018 at 4:20 am to Dr. Kerney Elbe , who verbally acknowledged these results. Electronically Signed   By: Logan Bores M.D.   On: 12/29/2018 04:29   Ct Head Code Stroke Wo Contrast  Result Date: 12/29/2018 CLINICAL DATA:  Code stroke. 78 y/o M; altered mental status and right-sided facial droop as well as right arm contracture. Possible stroke. History of glioblastoma post resection. EXAM: CT HEAD WITHOUT CONTRAST TECHNIQUE: Contiguous axial images were obtained from the base of the skull through the vertex without intravenous contrast. COMPARISON:  11/17/2018 MRI of the head.  11/13/2017 CT of the head. FINDINGS: Brain: No acute stroke, hemorrhage, hydrocephalus, extra-axial collection, or herniation. Diffuse hypoattenuation white matter in the left-greater-than-right cerebrum is stable in distribution in comparison with T2 signal abnormality on the prior MRI of the brain given differences in technique. There is stable mass effect in the left anterior frontal lobe corresponding to the region of resected glioblastoma. Suboptimal assessment of the tumor in the absence of contrast. Vascular: No hyperdense vessel or unexpected calcification. Skull: Stable postsurgical changes related to left frontal craniotomy. Sinuses/Orbits: No acute finding. Other: None. ASPECTS [facility] Stroke Program Early CT Score) - Ganglionic level  infarction (caudate, lentiform nuclei, internal capsule, insula, M1-M3 cortex): 7 - Supraganglionic infarction (M4-M6 cortex): 3 Total score (0-10 with 10 being normal): 10 IMPRESSION: 1. No acute stroke or hemorrhage identified. 2. ASPECTS is 10 3. extensive white matter changes, mass effect, and postsurgical changes of the left frontal lobe related to history of glioblastoma and resection are grossly stable from prior MRI given differences in technique. These results were communicated to Dr. Cheral Marker at 3:48 amon 6/15/2020by text page via the [facility] messaging system. Electronically Signed   By: Kristine Garbe M.D.   On: 12/29/2018 03:51     EKG: Independently reviewed.  Sinus rhythm, QTC 441, early R wave progression, no ischemic change.  Assessment/Plan Principal Problem:   Aphasia Active Problems:   Seizure (HCC)   Frontal glioblastoma multiforme (HCC)   Pulmonary embolism (HCC)   Acute metabolic encephalopathy   Aphasia and acute metabolic encephalopathy: pt has AMS, aphasia, right facial droop and right  upper arm rigidity.  Also had possible seizure-like activity per report.  Etiology is not clear, differential diagnosis mainly includes seizure versus stroke.  Neurology, Dr. Cheral Marker was consulted, who recommended load patient with Keppra by IV and get brain MRI with and without contrast.  - Placed on telemetry bed for observation - Highly appreciate Dr. Yvetta Coder recommendations. - Patient was loaded with 2 g of Keppra ED, will continue 100 mg twice daily by IV now - Follow-up MRI for brain with and without contrast - swallowing study - Frequent neuro check - EEG  Seizure -Seizure precaution -When necessary Ativan for seizure -Continue Home medications: keppra   Frontal glioblastoma multiforme [facility]): s/p of craniotomy resection, chemo and radiation therapy. -Follow-up with oncologist, -Follow-up MRI of brain  Pulmonary embolism ([place]): -continue eliquis now -if pt  fails swallowing study-->will need to switch to IV heparin     DVT ppx: on Eliquis Code Status: Full code per her daughter Family Communication: Yes, patient's  Daughter by phone Disposition Plan:  Anticipate discharge back to previous home environment Consults called:  Dr. Cheral Marker Admission status: Obs / tele          Date of Service 12/29/2018    [facility]   If 7PM-7AM, please contact night-coverage [email or web address] Password [facility] 12/29/2018, 7:52 AM

## 2018-12-29 NOTE — ED Triage Notes (Signed)
Patient arrived with EMS from home as a code stroke status , LSN 41 , family reported confusion with right facial droop , aphasia and right arm flexed /contracted . Seen by neurologist at arrival .Transported to CT scan with RN,rapid response nurse , neurologist and pharmacist .

## 2018-12-29 NOTE — ED Notes (Signed)
Attempted to call report

## 2018-12-29 NOTE — Code Documentation (Addendum)
Responded to Code stroke called at [facility] for AMS and R sided deficits. Pt arrived at 0325.  LSN-0050. CBG-11. NIH-20. Ct head negative. CTA/CTP done, no LVO. TPA not given-pt on eliquis. Plan to admit to medical team.

## 2018-12-29 NOTE — Progress Notes (Signed)
Patient arrived to 3w-34

## 2018-12-29 NOTE — Consult Note (Signed)
NEURO HOSPITALIST CONSULT NOTE   Requestig physician: Dr. Stark Jock  Reason for Consult: Acute onset of confusion and RUE posturing without volitional RUE movement.   History obtained from:  EMS and Chart     HPI:                                                                                                                                          Christopher Morris is an 78 y.o. male with a history of seizure diagnosed in March of 2019, on Keppra 1000 mg BID, left frontal glioblastoma s/p craniotomy with tumor resection in March of 2019, history of PE on Eliqus, who presents with acute onset of confusion and RUE posturing without volitional RUE. He was at home watching TV with his son when at 12:50 AM he got up to use the bathroom. Son heard him fall in the bathroom and went to check on him, noting that his LUE was not moving normally, was flexed and stiff and with some tremor-like movements. Patient was also acutely confused. EMS was called and Code Stroke was called in the field for "right deficit". On arrival the patient was aphasic with RUE rigidity and posturing that waxed and waned and also changed from extensor to flexor posturing during the exam.   Past Medical History:  Diagnosis Date  . Brain cancer ([place])   . Glioblastoma ([place]) 09/25/2017  . Hyperglycemia 09/25/2017  . Seizure ([place]) 09/25/2017    Past Surgical History:  Procedure Laterality Date  . APPLICATION OF CRANIAL NAVIGATION N/A 09/30/2017   Procedure: APPLICATION OF CRANIAL NAVIGATION;  Surgeon: [name], MD;  Location: [place];  Service: Neurosurgery;  Laterality: N/A;  . CRANIOTOMY N/A 09/30/2017   Procedure: CRANIOTOMY FOR RESECTION OF BRAIN TUMOR WITH STEREOTACTIC;  Surgeon: [name], MD;  Location: [place];  Service: Neurosurgery;  Laterality: N/A;  CRANIOTOMY FOR RESECTION OF BRAIN TUMOR WITH STEREOTACTIC    Family History  Problem Relation Age of Onset  . Hypertension Daughter   . Stroke  Mother   . Cancer Neg Hx               Social History:  reports that he has never smoked. He has never used smokeless tobacco. He reports that he does not drink alcohol or use drugs.  Allergies  Allergen Reactions  . Pork-Derived Products     Patient does not eat pork  . Shellfish Allergy     Patient does not eat shellfish    HOME MEDICATIONS:  Temozolamide Keppra 1000 mg BID Zofran Eliquis  ROS:                                                                                                                                       Unable to obtain due to aphasia.    There were no vitals taken for this visit.   General Examination:                                                                                                       Physical Exam  HEENT-  Normocephalic Lungs- Respirations unlabored Extremities- No edema   Neurological Examination Mental Status: Awake and alert. Dense expressive and receptive aphasia with fragmentary non-grammatical responses to questions. Unable to follow any verbal commands except for raising his LLE. Will make eye contact briefly.  Cranial Nerves: II: No blink to threat on right. Blink to threat is present on the left.  III,IV, VI: EOMI to left and right when tracking examiner's face. No nystagmus. No ptosis.  V,VII: Mild decreased tone right side of face. Unable to formally test sensation, but does not grimace to brow ridge pressure bilaterally.  VIII: Hearing intact to voice IX,X: Unable to visualize palate XI: Head at midline XII: Does not follow commands for tongue protrusion Motor: RUE with increased flexor tone. During exam there is extensor and then flexor posturing, the latter accompanied by brief episode of tremulousness. Does not move RUE to command or noxious LUE: 5/5 resistance in flexion and extension; 5/5  grip RLE: Weak withdrawal to plantar stimulation LLE: Elevates above bed to command and resists examiner with 5/5 strength Sensory: Does not move either upper ext to pinch. Reacts to plantar stimulation BLE.  Deep Tendon Reflexes: 2+ bilateral upper extremities and patellae Plantars: Right: Upgoing   Left: downgoing Cerebellar/Gait: Unable to assess due to aphasia.     Lab Results: Basic Metabolic Panel: No results for input(s): NA, K, CL, CO2, GLUCOSE, BUN, CREATININE, CALCIUM, MG, PHOS in the last 168 hours.  CBC: No results for input(s): WBC, NEUTROABS, HGB, HCT, MCV, PLT in the last 168 hours.  Cardiac Enzymes: No results for input(s): CKTOTAL, CKMB, CKMBINDEX, TROPONINI in the last 168 hours.  Lipid Panel: No results for input(s): CHOL, TRIG, HDL, CHOLHDL, VLDL, LDLCALC in the last 168 hours.  Imaging: No results found.   Assessment: 78 year old male with history of brain tumor and intermittent "drawing up" of RUE, no listed diagnosis of seizure in Epic,  who presents after a fall at home followed by involuntary flexor posturing of RUE with tremor and AMS. Code Stroke called in the field. On Eliquis.  1. Flexor posturing of RUE with increased tone noted on arrival to the ED, together with waxing and waning ability to follow commands. He has receptive and expressive aphasia on exam.  2. DDx for presentation includes acute stroke, subtle partial complex seizures and ICH 3. Not a tPA candidate due to anticoagulation with DOAC 4. STAT CT head preliminary read reveals no ICH. There is evidence for prior left anterior frontal craniectomy with left frontal lobe resected versus unresected mass and adjacent white matter edema versus gliosis.  5. CTA head/neck: No LVO. Moderate bilateral P2 stenoses.  6. CTP: Altered perfusion in the left cerebral hemisphere particularly involving frontoparietal subcortical white matter. While this may reflect an acute infarct with small amount of penumbra,  the patient's known underlying glioblastoma and edema/post treatment changes may also be contributing to the altered perfusion.   Recommendations: 1. Loading Keppra 2000 mg IV x 1 now 2. Continue home dose of Keppra at 1000 mg BID 3. EEG in AM 4. Frequent neuro checks 5. MRI brain with and without contrast 6. Seizure precautions.    Electronically signed: Dr. Kerney Elbe 12/29/2018, 3:35 AM

## 2018-12-29 NOTE — Procedures (Signed)
  [place] A. Merlene Laughter, MD     [email or web address]           HISTORY: This is an 78 year old male who presents with acute onset of aphasia, alteration of consciousness, facial droop and shaking of the right side worrisome for seizures.  MEDICATIONS:  Current Facility-Administered Medications:  .  acetaminophen (TYLENOL) tablet 650 mg, 650 mg, Oral, Q4H PRN **OR** acetaminophen (TYLENOL) solution 650 mg, 650 mg, Per Tube, Q4H PRN **OR** acetaminophen (TYLENOL) suppository 650 mg, 650 mg, Rectal, Q4H PRN, Ivor Costa, MD .  apixaban (ELIQUIS) tablet 5 mg, 5 mg, Oral, BID, Ivor Costa, MD, 5 mg at 12/29/18 1146 .  levETIRAcetam (KEPPRA XR) 24 hr tablet 1,000 mg, 1,000 mg, Oral, BID, Ivor Costa, MD, 1,000 mg at 12/29/18 1146 .  LORazepam (ATIVAN) injection 1 mg, 1 mg, Intravenous, Q2H PRN, Ivor Costa, MD .  senna-docusate (Senokot-S) tablet 1 tablet, 1 tablet, Oral, QHS PRN, Ivor Costa, MD .  sodium chloride flush (NS) 0.9 % injection 3 mL, 3 mL, Intravenous, Once, Ivor Costa, MD     ANALYSIS: A 16 channel recording using standard 10 20 measurements is conducted for 28 minutes.   There is a well-formed posterior dominant rhythm of 7 hertz which attenuates with eye-opening.  There is beta activity observed in the frontal areas.  Awake and drowsy architecture of observed.    There is occasional frontal central slowing noted bilaterally. There is normal myogenic interference noted throughout the recording.  Photic stimulation is conducted without abnormal changes in the background activity.  There is no focal or lateralized slowing.  There is no epileptiform activity is observed.   IMPRESSION: 1.   This recording shows mild global slowing indicating a mild global encephalopathy.  However, no epileptiform activities are observed.      Brittanny Levenhagen A. Merlene Laughter, M.D.  Diplomate, Tax adviser of Psychiatry and Neurology ( Neurology).

## 2018-12-29 NOTE — ED Notes (Signed)
Patient transported to MRI 

## 2018-12-29 NOTE — Progress Notes (Signed)
78 yo M presenting with AMS. See this morning's H&P for full details. Awaiting EEG results, final neuro recs. PT/SLP consult pending. Continue as per this AM's H&P. Not sure that he is base at baseline as he is still somewhat confused. Neurological workup ongoing.   Aphasia and acute metabolic encephalopathy:      - pt presents AMS, aphasia, right facial droop and right upper arm rigidity.       - Also had possible seizure-like activity per report.     - Neurology, Dr. Cheral Marker was consulted, who recommended load patient with Keppra by IV and get brain MRI with and without contrast.     - MR Brain w/o acute infarct     - EEG result pending     - SLP eval pending     - PT eval pending     - OT recs noted  Seizure     - Seizure precaution     - When necessary Ativan for seizure     - Continue Home medications: keppra   Frontal glioblastoma multiforme ([place]):      - s/p of craniotomy resection, chemo and radiation therapy.     - outpt Follow-up with oncologist     - MR brain results noted  Pulmonary embolism ([place]):     - continue eliquis now     - if pt fails swallowing study-->will need to switch to IV heparin  General: 78 y.o. male resting in bed in NAD Cardiovascular: RRR, +S1, S2, no m/g/r, equal pulses throughout Respiratory: CTABL, no w/r/r, normal WOB GI: BS+, NDNT, no masses noted, no organomegaly noted MSK: No e/c/c Skin: No rashes, bruises, ulcerations noted Neuro: Alert to name, right arm is still somewhat rigid and he is having difficulty following commands (?confusion)  .Jonnie Finner, DO

## 2018-12-29 NOTE — ED Provider Notes (Signed)
[facility] EMERGENCY DEPARTMENT Provider Note   CSN: [id number] Arrival date & time: 12/29/18  0017  An emergency department physician performed an initial assessment on this suspected stroke patient at 0329.  History   Chief Complaint No chief complaint on file.   HPI Christopher Morris is a 78 y.o. male.     Patient is an 78 year old male with past medical history of glioblastoma status post resection.  He presents today for evaluation of fall.  According to EMS, the patient was ambulating this evening when he fell to the ground, then had difficulty moving his right side.  Patient was transported here as a code stroke.  He adds little additional history secondary to mental status.  The history is provided by the patient.    Past Medical History:  Diagnosis Date   Brain cancer ([place])    Glioblastoma ([place]) 09/25/2017   Hyperglycemia 09/25/2017   Seizure ([place]) 09/25/2017    Patient Active Problem List   Diagnosis Date Noted   Pneumonia 12/22/2017   HCAP (healthcare-associated pneumonia) 12/22/2017   Pulmonary embolism ([place]) 12/22/2017   Tachycardia    Vitamin B12 deficiency 11/14/2017   Anemia 11/14/2017   Fever 11/13/2017   Falls 11/13/2017   Dehydration 11/13/2017   Interstitial pneumonia ([place]) 11/11/2017   Sepsis ([place]) 11/09/2017   Bacteremia due to Gram-negative bacteria 10/26/2017   Sepsis due to Escherichia coli (E. coli) ([place]) 10/26/2017   UTI (urinary tract infection) 10/25/2017   Seizure ([place]) 09/25/2017   Frontal glioblastoma multiforme ([place]) 09/25/2017   Hyperglycemia 09/25/2017   BLOOD IN STOOL 07/26/2008   ERECTILE DYSFUNCTION 07/12/2008    Past Surgical History:  Procedure Laterality Date   APPLICATION OF CRANIAL NAVIGATION N/A 09/30/2017   Procedure: APPLICATION OF CRANIAL NAVIGATION;  Surgeon: [name], MD;  Location: [facility];  Service: Neurosurgery;  Laterality: N/A;   CRANIOTOMY N/A 09/30/2017    Procedure: CRANIOTOMY FOR RESECTION OF BRAIN TUMOR WITH STEREOTACTIC;  Surgeon: [name], MD;  Location: [facility];  Service: Neurosurgery;  Laterality: N/A;  CRANIOTOMY FOR RESECTION OF BRAIN TUMOR WITH STEREOTACTIC        Home Medications    Prior to Admission medications   Medication Sig Start Date End Date Taking? Authorizing Provider  apixaban (ELIQUIS) 5 MG TABS tablet Take 10 mg BID till 12/24/2017, start taking 5 mg BID from 12/25/2017 12/20/17   Lavina Hamman, MD  levETIRAcetam (KEPPRA) 1000 MG tablet Take 1 tablet (1,000 mg total) by mouth 2 (two) times daily. 11/26/17   Vaslow, Acey Lav, MD  ondansetron (ZOFRAN) 8 MG tablet Take 1 tablet (8 mg total) by mouth 2 (two) times daily as needed. Start on the third day after chemotherapy. Patient not taking: Reported on 07/31/2018 06/02/18   Ventura Sellers, MD  temozolomide (TEMODAR) 140 MG capsule Take 2 capsules (280 mg total) by mouth daily. May take on an empty stomach to decrease nausea & vomiting. Patient not taking: Reported on 07/31/2018 03/03/18   Ventura Sellers, MD  temozolomide (TEMODAR) 140 MG capsule Take 2 capsules (280 mg total) by mouth daily. May take on an empty stomach to decrease nausea & vomiting. Patient not taking: Reported on 07/31/2018 05/21/18   Ventura Sellers, MD  temozolomide (TEMODAR) 140 MG capsule Take 2 capsules (280 mg total) by mouth daily. May take on an empty stomach to decrease nausea & vomiting. Patient not taking: Reported on 07/31/2018 05/27/18   Ventura Sellers, MD  temozolomide [facility]) 140  MG capsule Take 2 capsules (280 mg total) by mouth daily. May take on an empty stomach to decrease nausea & vomiting. Patient not taking: Reported on 07/31/2018 06/18/18   Ventura Sellers, MD  temozolomide (TEMODAR) 140 MG capsule Take 2 capsules (280 mg total) by mouth daily. May take on an empty stomach to decrease nausea & vomiting. Patient not taking: Reported on 08/28/2018 07/31/18   Ventura Sellers, MD  temozolomide (TEMODAR) 140 MG capsule Take 2 capsules (280 mg total) by mouth daily. May take on an empty stomach to decrease nausea & vomiting. Patient not taking: Reported on 11/17/2018 08/28/18   Ventura Sellers, MD  temozolomide (TEMODAR) 140 MG capsule Take 2 capsules (280 mg total) by mouth daily. May take on an empty stomach to decrease nausea & vomiting. Patient not taking: Reported on 11/17/2018 09/09/18   Ventura Sellers, MD  temozolomide (TEMODAR) 140 MG capsule Take 2 capsules (280 mg total) by mouth daily. May take on an empty stomach to decrease nausea & vomiting. Patient not taking: Reported on 11/17/2018 10/09/18   Ventura Sellers, MD  temozolomide (TEMODAR) 140 MG capsule Take 2 capsules (280 mg total) by mouth daily. May take on an empty stomach to decrease nausea & vomiting. Patient not taking: Reported on 11/17/2018 10/24/18   Ventura Sellers, MD  temozolomide (TEMODAR) 20 MG capsule Take 2 capsules (40 mg total) by mouth daily. May take on an empty stomach to decrease nausea & vomiting. Patient not taking: Reported on 07/31/2018 03/03/18   Ventura Sellers, MD  temozolomide (TEMODAR) 20 MG capsule Take 2 capsules (40 mg total) by mouth daily. May take on an empty stomach to decrease nausea & vomiting. Patient not taking: Reported on 07/31/2018 05/21/18   Ventura Sellers, MD  temozolomide (TEMODAR) 20 MG capsule Take 2 capsules (40 mg total) by mouth daily. May take on an empty stomach to decrease nausea & vomiting. Patient not taking: Reported on 07/31/2018 05/27/18   Ventura Sellers, MD  temozolomide (TEMODAR) 20 MG capsule Take 2 capsules (40 mg total) by mouth daily. May take on an empty stomach to decrease nausea & vomiting. Patient not taking: Reported on 07/31/2018 06/18/18   Ventura Sellers, MD  temozolomide (TEMODAR) 20 MG capsule Take 2 capsules (40 mg total) by mouth daily. May take on an empty stomach to decrease nausea & vomiting. Patient not taking: Reported on  08/28/2018 07/31/18   Ventura Sellers, MD  temozolomide (TEMODAR) 20 MG capsule Take 2 capsules (40 mg total) by mouth daily. May take on an empty stomach to decrease nausea & vomiting. Patient not taking: Reported on 11/17/2018 08/28/18   Ventura Sellers, MD  temozolomide (TEMODAR) 20 MG capsule Take 2 capsules (40 mg total) by mouth daily. May take on an empty stomach to decrease nausea & vomiting. Patient not taking: Reported on 11/17/2018 09/09/18   Ventura Sellers, MD  temozolomide (TEMODAR) 20 MG capsule Take 2 capsules (40 mg total) by mouth daily. May take on an empty stomach to decrease nausea & vomiting. Patient not taking: Reported on 11/17/2018 10/09/18   Ventura Sellers, MD  temozolomide (TEMODAR) 20 MG capsule Take 2 capsules (40 mg total) by mouth daily. May take on an empty stomach to decrease nausea & vomiting. Patient not taking: Reported on 11/17/2018 10/24/18   Ventura Sellers, MD    Family History Family History  Problem Relation Age of Onset   Hypertension Daughter  Stroke Mother    Cancer Neg Hx     Social History Social History   Tobacco Use   Smoking status: Never Smoker   Smokeless tobacco: Never Used  Substance Use Topics   Alcohol use: No    Frequency: Never   Drug use: No     Allergies   Pork-derived products and Shellfish allergy   Review of Systems Review of Systems  Unable to perform ROS: Mental status change     Physical Exam Updated Vital Signs Ht 6\' 4"  (1.93 m)    Wt 92.2 kg    BMI 24.74 kg/m   Physical Exam Vitals signs and nursing note reviewed.  Constitutional:      General: He is not in acute distress.    Appearance: He is well-developed. He is not diaphoretic.  HENT:     Head: Normocephalic and atraumatic.  Eyes:     Extraocular Movements: Extraocular movements intact.     Pupils: Pupils are equal, round, and reactive to light.  Neck:     Musculoskeletal: Normal range of motion and neck supple.  Cardiovascular:      Rate and Rhythm: Normal rate and regular rhythm.     Heart sounds: No murmur. No friction rub.  Pulmonary:     Effort: Pulmonary effort is normal. No respiratory distress.     Breath sounds: Normal breath sounds. No wheezing or rales.  Abdominal:     General: Bowel sounds are normal. There is no distension.     Palpations: Abdomen is soft.     Tenderness: There is no abdominal tenderness.  Musculoskeletal: Normal range of motion.  Skin:    General: Skin is warm and dry.  Neurological:     Mental Status: He is alert.     Coordination: Coordination normal.     Comments: Patient is awake and alert.  He has difficulty speaking and responding to commands.  His right arm is held in flexion.  Cranial nerves appear intact.  Neurologic exam is limited secondary to acuity of condition.      ED Treatments / Results  Labs (all labs ordered are listed, but only abnormal results are displayed) Labs Reviewed  I-STAT CHEM 8, ED - Abnormal; Notable for the following components:      Result Value   Glucose, Bld 117 (*)    Calcium, Ion 1.12 (*)    All other components within normal limits  CBG MONITORING, ED - Abnormal; Notable for the following components:   Glucose-Capillary 111 (*)    All other components within normal limits  PROTIME-INR  APTT  CBC  DIFFERENTIAL  COMPREHENSIVE METABOLIC PANEL  I-STAT CREATININE, ED    EKG    Radiology Ct Head Code Stroke Wo Contrast  Result Date: 12/29/2018 CLINICAL DATA:  Code stroke. 78 y/o M; altered mental status and right-sided facial droop as well as right arm contracture. Possible stroke. History of glioblastoma post resection. EXAM: CT HEAD WITHOUT CONTRAST TECHNIQUE: Contiguous axial images were obtained from the base of the skull through the vertex without intravenous contrast. COMPARISON:  11/17/2018 MRI of the head.  11/13/2017 CT of the head. FINDINGS: Brain: No acute stroke, hemorrhage, hydrocephalus, extra-axial collection, or  herniation. Diffuse hypoattenuation white matter in the left-greater-than-right cerebrum is stable in distribution in comparison with T2 signal abnormality on the prior MRI of the brain given differences in technique. There is stable mass effect in the left anterior frontal lobe corresponding to the region of resected glioblastoma. Suboptimal assessment  of the tumor in the absence of contrast. Vascular: No hyperdense vessel or unexpected calcification. Skull: Stable postsurgical changes related to left frontal craniotomy. Sinuses/Orbits: No acute finding. Other: None. ASPECTS [facility] Stroke Program Early CT Score) - Ganglionic level infarction (caudate, lentiform nuclei, internal capsule, insula, M1-M3 cortex): 7 - Supraganglionic infarction (M4-M6 cortex): 3 Total score (0-10 with 10 being normal): 10 IMPRESSION: 1. No acute stroke or hemorrhage identified. 2. ASPECTS is 10 3. extensive white matter changes, mass effect, and postsurgical changes of the left frontal lobe related to history of glioblastoma and resection are grossly stable from prior MRI given differences in technique. These results were communicated to Dr. Cheral Marker at 3:48 amon 6/15/2020by text page via the [facility] messaging system. Electronically Signed   By: Kristine Garbe M.D.   On: 12/29/2018 03:51    Procedures Procedures (including critical care time)  Medications Ordered in ED Medications  sodium chloride flush (NS) 0.9 % injection 3 mL (has no administration in time range)  levETIRAcetam (KEPPRA) 2,000 mg in sodium chloride 0.9 % 100 mL IVPB (has no administration in time range)  iohexol (OMNIPAQUE) 350 MG/ML injection 115 mL (115 mLs Intravenous Contrast Given 12/29/18 0348)     Initial Impression / Assessment and Plan / ED Course  I have reviewed the triage vital signs and the nursing notes.  Pertinent labs & imaging results that were available during my care of the patient were reviewed by me and considered in my  medical decision making (see chart for details).  Patient is an 78 year old male with history of glioblastoma status post resection presenting with fall and possible stroke.  Patient was exhibiting right-sided weakness and confusion upon EMS arrival.  Patient was brought here as a code stroke.  He arrived here with his right arm held in flexion and appeared disoriented.  He was aphasic.  Patient was seen immediately by Dr. Cheral Marker and myself, then sent to radiology for imaging studies of his brain.  CT, CT angio, and perfusion scan were performed.  The perfusion scan showed findings likely related to his prior surgery, but no definite deficit.  Patient given a dose of Keppra here in the ER.  He remains somewhat confused, but his arm flexion appears to be less pronounced.    Patient's episode this evening felt to be related to seizure and patient may be in a postictal state.  Patient will be admitted to the hospitalist service for evaluation and observation.  CRITICAL CARE Performed by: Veryl Speak Total critical care time: 35 minutes Critical care time was exclusive of separately billable procedures and treating other patients. Critical care was necessary to treat or prevent imminent or life-threatening deterioration. Critical care was time spent personally by me on the following activities: development of treatment plan with patient and/or surrogate as well as nursing, discussions with consultants, evaluation of patient's response to treatment, examination of patient, obtaining history from patient or surrogate, ordering and performing treatments and interventions, ordering and review of laboratory studies, ordering and review of radiographic studies, pulse oximetry and re-evaluation of patient's condition.   Final Clinical Impressions(s) / ED Diagnoses   Final diagnoses:  None    ED Discharge Orders    None       Veryl Speak, MD 12/29/18 [phone]

## 2018-12-29 NOTE — ED Notes (Signed)
Christopher Morris pts daughter @ [phone] please update

## 2018-12-29 NOTE — ED Notes (Signed)
ED TO INPATIENT HANDOFF REPORT  ED Nurse Name and Phone #:  Clydene Laming [phone]  S Name/Age/Gender Christopher Morris 78 y.o. male Room/Bed: 017C/017C  Code Status : FULL  Home/SNF/Other : HOME   Triage Complete: Triage complete  Chief Complaint code stroke  Triage Note Patient arrived with EMS from home as a code stroke status , LSN [id number] , family reported confusion with right facial droop , aphasia and right arm flexed /contracted . Seen by neurologist at arrival .Transported to CT scan with RN,rapid response nurse , neurologist and pharmacist .   Allergies Allergies  Allergen Reactions  . Pork-Derived Products     Patient does not eat pork  . Shellfish Allergy     Patient does not eat shellfish    Level of Care/Admitting Diagnosis ED Disposition    ED Disposition Condition [facility] Area: [place] [[id number]]  Level of Care: Telemetry Medical [104]  I expect the patient will be discharged within 24 hours: No (not a candidate for 5C-Observation unit)  Covid Evaluation: Screening Protocol (No Symptoms)  Diagnosis: Aphasia Marvelous.[place].3.ICD-9-CM]  Admitting Physician: Ivor Costa [[id number]]  Attending Physician: Ivor Costa [[id number]]  PT Class (Do Not Modify): Observation [104]  PT Acc Code (Do Not Modify): Observation [10022]       B Medical/Surgery History Past Medical History:  Diagnosis Date  . Brain cancer ([place])   . Glioblastoma ([place]) 09/25/2017  . Hyperglycemia 09/25/2017  . Seizure ([place]) 09/25/2017   Past Surgical History:  Procedure Laterality Date  . APPLICATION OF CRANIAL NAVIGATION N/A 09/30/2017   Procedure: APPLICATION OF CRANIAL NAVIGATION;  Surgeon: [name], MD;  Location: [facility];  Service: Neurosurgery;  Laterality: N/A;  . CRANIOTOMY N/A 09/30/2017   Procedure: CRANIOTOMY FOR RESECTION OF BRAIN TUMOR WITH STEREOTACTIC;  Surgeon: [name], MD;  Location: [facility];  Service: Neurosurgery;  Laterality: N/A;  CRANIOTOMY FOR  RESECTION OF BRAIN TUMOR WITH STEREOTACTIC     A IV Location/Drains/Wounds Patient Lines/Drains/Airways Status   Active Line/Drains/Airways    Name:   Placement date:   Placement time:   Site:   Days:   Peripheral IV 12/29/18 Left Antecubital   12/29/18    -    Antecubital   less than 1   Peripheral IV 12/29/18 Right Hand   12/29/18    -    Hand   less than 1          Intake/Output Last 24 hours No intake or output data in the 24 hours ending 12/29/18 0536  Labs/Imaging Results for orders placed or performed during the hospital encounter of 12/29/18 (from the past 48 hour(s))  CBG monitoring, ED     Status: Abnormal   Collection Time: 12/29/18  3:32 AM  Result Value Ref Range   Glucose-Capillary 111 (H) 70 - 99 mg/dL  Protime-INR     Status: None   Collection Time: 12/29/18  3:35 AM  Result Value Ref Range   Prothrombin Time 14.4 11.4 - 15.2 seconds   INR 1.1 0.8 - 1.2    Comment: (NOTE) INR goal varies based on device and disease states. Performed at [facility], [place] [address], [place], [place] [address]   APTT     Status: None   Collection Time: 12/29/18  3:35 AM  Result Value Ref Range   aPTT 29 24 - 36 seconds    Comment: Performed at [facility] [address],  [place], [place] [address]  CBC     Status: None   Collection Time: 12/29/18  3:35 AM  Result Value Ref Range   WBC 7.5 4.0 - 10.5 K/uL   RBC 4.80 4.22 - 5.81 MIL/uL   Hemoglobin 14.3 13.0 - 17.0 g/dL   HCT 43.0 39.0 - 52.0 %   MCV 89.6 80.0 - 100.0 fL   MCH 29.8 26.0 - 34.0 pg   MCHC 33.3 30.0 - 36.0 g/dL   RDW 12.5 11.5 - 15.5 %   Platelets 195 150 - 400 K/uL   nRBC 0.0 0.0 - 0.2 %    Comment: Performed at [facility], [place] [address], [place], [place] [address]  Differential     Status: None   Collection Time: 12/29/18  3:35 AM  Result Value Ref Range   Neutrophils Relative % 41 %   Neutro Abs 3.1 1.7 - 7.7 K/uL   Lymphocytes Relative 48 %   Lymphs Abs 3.7 0.7 - 4.0 K/uL    Monocytes Relative 9 %   Monocytes Absolute 0.6 0.1 - 1.0 K/uL   Eosinophils Relative 1 %   Eosinophils Absolute 0.1 0.0 - 0.5 K/uL   Basophils Relative 1 %   Basophils Absolute 0.0 0.0 - 0.1 K/uL   Immature Granulocytes 0 %   Abs Immature Granulocytes 0.02 0.00 - 0.07 K/uL    Comment: Performed at [facility], [place] [address], [place], [place] [address]  Comprehensive metabolic panel     Status: Abnormal   Collection Time: 12/29/18  3:35 AM  Result Value Ref Range   Sodium 140 135 - 145 mmol/L   Potassium 3.9 3.5 - 5.1 mmol/L   Chloride 104 98 - 111 mmol/L   CO2 24 22 - 32 mmol/L   Glucose, Bld 122 (H) 70 - 99 mg/dL   BUN 10 8 - 23 mg/dL   Creatinine, Ser 1.11 0.61 - 1.24 mg/dL   Calcium 9.7 8.9 - 10.3 mg/dL   Total Protein 7.1 6.5 - 8.1 g/dL   Albumin 4.4 3.5 - 5.0 g/dL   AST 20 15 - 41 U/L   ALT 12 0 - 44 U/L   Alkaline Phosphatase 81 38 - 126 U/L   Total Bilirubin 0.9 0.3 - 1.2 mg/dL   GFR calc non Af Amer >60 >60 mL/min   GFR calc Af Amer >60 >60 mL/min   Anion gap 12 5 - 15    Comment: Performed at [facility], [place] [address], [place], [place] [address]  I-stat chem 8, ED     Status: Abnormal   Collection Time: 12/29/18  3:46 AM  Result Value Ref Range   Sodium 139 135 - 145 mmol/L   Potassium 3.8 3.5 - 5.1 mmol/L   Chloride 105 98 - 111 mmol/L   BUN 12 8 - 23 mg/dL   Creatinine, Ser 1.00 0.61 - 1.24 mg/dL   Glucose, Bld 117 (H) 70 - 99 mg/dL   Calcium, Ion 1.12 (L) 1.15 - 1.40 mmol/L   TCO2 25 22 - 32 mmol/L   Hemoglobin 14.6 13.0 - 17.0 g/dL   HCT 43.0 39.0 - 52.0 %  I-stat Creatinine, ED     Status: None   Collection Time: 12/29/18  3:56 AM  Result Value Ref Range   Creatinine, Ser 1.00 0.61 - 1.24 mg/dL   Ct Code Stroke Cta Head W/wo Contrast  Result Date: 12/29/2018 CLINICAL DATA:  Right facial droop.  History of  glioblastoma. EXAM: CT ANGIOGRAPHY HEAD AND NECK CT PERFUSION BRAIN TECHNIQUE: Multidetector CT imaging of the head and neck was  performed using the standard protocol during bolus administration of intravenous contrast. Multiplanar CT image reconstructions and MIPs were obtained to evaluate the vascular anatomy. Carotid stenosis measurements (when applicable) are obtained utilizing NASCET criteria, using the distal internal carotid diameter as the denominator. Multiphase CT imaging of the brain was performed following IV bolus contrast injection. Subsequent parametric perfusion maps were calculated using RAPID software. CONTRAST:  179mL OMNIPAQUE IOHEXOL 350 MG/ML SOLN COMPARISON:  Head CT 12/29/2018 and MRI 11/17/2018. FINDINGS: CTA NECK FINDINGS Aortic arch: Standard 3 vessel aortic arch with mild atherosclerotic plaque. Widely patent arch vessel origins. Right carotid system: Patent without evidence of stenosis or dissection. Minimal atherosclerotic plaque in the carotid bulb. Retropharyngeal course of the distal common and proximal internal carotid arteries. Left carotid system: Patent without evidence of stenosis or dissection. Partially retropharyngeal course of the distal common and proximal external carotid arteries. Vertebral arteries: Patent and codominant without evidence of stenosis or dissection. Skeleton: Advanced diffuse cervical disc degeneration. Other neck: No evidence of cervical lymphadenopathy or mass. Upper chest: Clear lung apices. Review of the MIP images confirms the above findings CTA HEAD FINDINGS Anterior circulation: The internal carotid arteries are patent from skull base to carotid termini with nonstenotic plaque more notable on the left. ACAs and MCAs are patent with mild branch vessel irregularity but no evidence of proximal branch occlusion or significant proximal stenosis. No aneurysm is identified. Posterior circulation: The intracranial vertebral arteries are patent to the basilar without significant stenosis. Patent right PICA, left AICA, and bilateral SCA origins are identified. The basilar artery is  widely patent. Posterior communicating arteries are diminutive or absent. PCAs are patent with moderate P2 stenoses bilaterally. No aneurysm is identified. Venous sinuses: As permitted by contrast timing, patent. Anatomic variants: None. Review of the MIP images confirms the above findings CT Brain Perfusion Findings: ASPECTS: 10 CBF (<30%) Volume: 59mL Perfusion (Tmax>6.0s) volume: 30mL Mismatch Volume: 35mL Infarction Location:Left cerebral hemisphere primarily involving frontal and parietal lobes in the MCA territory as well as possibly some ACA involvement. This perfusion abnormality is predominantly within subcortical white matter with scattered areas of cortical involvement and partially corresponds to the patient's known glioblastoma and white matter changes on MRI. IMPRESSION: 1. No emergent large vessel occlusion. 2. Moderate bilateral P2 stenoses. 3. No significant proximal stenosis in the anterior intracranial circulation. 4. Widely patent cervical carotid and vertebral arteries. 5. Altered perfusion in the left cerebral hemisphere particularly involving frontoparietal subcortical white matter. While this may reflect an acute infarct with small amount of penumbra, the patient's known underlying glioblastoma and edema/post treatment changes may also be contributing to the altered perfusion. 6.  Aortic Atherosclerosis (ICD10-I70.0). These results were called by telephone at the time of interpretation on 12/29/2018 at 4:20 am to Dr. Kerney Elbe , who verbally acknowledged these results. Electronically Signed   By: Logan Bores M.D.   On: 12/29/2018 04:29   Ct Code Stroke Cta Neck W/wo Contrast  Result Date: 12/29/2018 CLINICAL DATA:  Right facial droop.  History of glioblastoma. EXAM: CT ANGIOGRAPHY HEAD AND NECK CT PERFUSION BRAIN TECHNIQUE: Multidetector CT imaging of the head and neck was performed using the standard protocol during bolus administration of intravenous contrast. Multiplanar CT image  reconstructions and MIPs were obtained to evaluate the vascular anatomy. Carotid stenosis measurements (when applicable) are obtained utilizing NASCET criteria, using the distal internal carotid  diameter as the denominator. Multiphase CT imaging of the brain was performed following IV bolus contrast injection. Subsequent parametric perfusion maps were calculated using RAPID software. CONTRAST:  159mL OMNIPAQUE IOHEXOL 350 MG/ML SOLN COMPARISON:  Head CT 12/29/2018 and MRI 11/17/2018. FINDINGS: CTA NECK FINDINGS Aortic arch: Standard 3 vessel aortic arch with mild atherosclerotic plaque. Widely patent arch vessel origins. Right carotid system: Patent without evidence of stenosis or dissection. Minimal atherosclerotic plaque in the carotid bulb. Retropharyngeal course of the distal common and proximal internal carotid arteries. Left carotid system: Patent without evidence of stenosis or dissection. Partially retropharyngeal course of the distal common and proximal external carotid arteries. Vertebral arteries: Patent and codominant without evidence of stenosis or dissection. Skeleton: Advanced diffuse cervical disc degeneration. Other neck: No evidence of cervical lymphadenopathy or mass. Upper chest: Clear lung apices. Review of the MIP images confirms the above findings CTA HEAD FINDINGS Anterior circulation: The internal carotid arteries are patent from skull base to carotid termini with nonstenotic plaque more notable on the left. ACAs and MCAs are patent with mild branch vessel irregularity but no evidence of proximal branch occlusion or significant proximal stenosis. No aneurysm is identified. Posterior circulation: The intracranial vertebral arteries are patent to the basilar without significant stenosis. Patent right PICA, left AICA, and bilateral SCA origins are identified. The basilar artery is widely patent. Posterior communicating arteries are diminutive or absent. PCAs are patent with moderate P2 stenoses  bilaterally. No aneurysm is identified. Venous sinuses: As permitted by contrast timing, patent. Anatomic variants: None. Review of the MIP images confirms the above findings CT Brain Perfusion Findings: ASPECTS: 10 CBF (<30%) Volume: 66mL Perfusion (Tmax>6.0s) volume: 15mL Mismatch Volume: 21mL Infarction Location:Left cerebral hemisphere primarily involving frontal and parietal lobes in the MCA territory as well as possibly some ACA involvement. This perfusion abnormality is predominantly within subcortical white matter with scattered areas of cortical involvement and partially corresponds to the patient's known glioblastoma and white matter changes on MRI. IMPRESSION: 1. No emergent large vessel occlusion. 2. Moderate bilateral P2 stenoses. 3. No significant proximal stenosis in the anterior intracranial circulation. 4. Widely patent cervical carotid and vertebral arteries. 5. Altered perfusion in the left cerebral hemisphere particularly involving frontoparietal subcortical white matter. While this may reflect an acute infarct with small amount of penumbra, the patient's known underlying glioblastoma and edema/post treatment changes may also be contributing to the altered perfusion. 6.  Aortic Atherosclerosis (ICD10-I70.0). These results were called by telephone at the time of interpretation on 12/29/2018 at 4:20 am to Dr. Kerney Elbe , who verbally acknowledged these results. Electronically Signed   By: Logan Bores M.D.   On: 12/29/2018 04:29   Ct Code Stroke Cta Cerebral Perfusion W/wo Contrast  Result Date: 12/29/2018 CLINICAL DATA:  Right facial droop.  History of glioblastoma. EXAM: CT ANGIOGRAPHY HEAD AND NECK CT PERFUSION BRAIN TECHNIQUE: Multidetector CT imaging of the head and neck was performed using the standard protocol during bolus administration of intravenous contrast. Multiplanar CT image reconstructions and MIPs were obtained to evaluate the vascular anatomy. Carotid stenosis measurements  (when applicable) are obtained utilizing NASCET criteria, using the distal internal carotid diameter as the denominator. Multiphase CT imaging of the brain was performed following IV bolus contrast injection. Subsequent parametric perfusion maps were calculated using RAPID software. CONTRAST:  144mL OMNIPAQUE IOHEXOL 350 MG/ML SOLN COMPARISON:  Head CT 12/29/2018 and MRI 11/17/2018. FINDINGS: CTA NECK FINDINGS Aortic arch: Standard 3 vessel aortic arch with mild atherosclerotic plaque. Widely patent  arch vessel origins. Right carotid system: Patent without evidence of stenosis or dissection. Minimal atherosclerotic plaque in the carotid bulb. Retropharyngeal course of the distal common and proximal internal carotid arteries. Left carotid system: Patent without evidence of stenosis or dissection. Partially retropharyngeal course of the distal common and proximal external carotid arteries. Vertebral arteries: Patent and codominant without evidence of stenosis or dissection. Skeleton: Advanced diffuse cervical disc degeneration. Other neck: No evidence of cervical lymphadenopathy or mass. Upper chest: Clear lung apices. Review of the MIP images confirms the above findings CTA HEAD FINDINGS Anterior circulation: The internal carotid arteries are patent from skull base to carotid termini with nonstenotic plaque more notable on the left. ACAs and MCAs are patent with mild branch vessel irregularity but no evidence of proximal branch occlusion or significant proximal stenosis. No aneurysm is identified. Posterior circulation: The intracranial vertebral arteries are patent to the basilar without significant stenosis. Patent right PICA, left AICA, and bilateral SCA origins are identified. The basilar artery is widely patent. Posterior communicating arteries are diminutive or absent. PCAs are patent with moderate P2 stenoses bilaterally. No aneurysm is identified. Venous sinuses: As permitted by contrast timing, patent.  Anatomic variants: None. Review of the MIP images confirms the above findings CT Brain Perfusion Findings: ASPECTS: 10 CBF (<30%) Volume: 90mL Perfusion (Tmax>6.0s) volume: 19mL Mismatch Volume: 78mL Infarction Location:Left cerebral hemisphere primarily involving frontal and parietal lobes in the MCA territory as well as possibly some ACA involvement. This perfusion abnormality is predominantly within subcortical white matter with scattered areas of cortical involvement and partially corresponds to the patient's known glioblastoma and white matter changes on MRI. IMPRESSION: 1. No emergent large vessel occlusion. 2. Moderate bilateral P2 stenoses. 3. No significant proximal stenosis in the anterior intracranial circulation. 4. Widely patent cervical carotid and vertebral arteries. 5. Altered perfusion in the left cerebral hemisphere particularly involving frontoparietal subcortical white matter. While this may reflect an acute infarct with small amount of penumbra, the patient's known underlying glioblastoma and edema/post treatment changes may also be contributing to the altered perfusion. 6.  Aortic Atherosclerosis (ICD10-I70.0). These results were called by telephone at the time of interpretation on 12/29/2018 at 4:20 am to Dr. Kerney Elbe , who verbally acknowledged these results. Electronically Signed   By: Logan Bores M.D.   On: 12/29/2018 04:29   Ct Head Code Stroke Wo Contrast  Result Date: 12/29/2018 CLINICAL DATA:  Code stroke. 78 y/o M; altered mental status and right-sided facial droop as well as right arm contracture. Possible stroke. History of glioblastoma post resection. EXAM: CT HEAD WITHOUT CONTRAST TECHNIQUE: Contiguous axial images were obtained from the base of the skull through the vertex without intravenous contrast. COMPARISON:  11/17/2018 MRI of the head.  11/13/2017 CT of the head. FINDINGS: Brain: No acute stroke, hemorrhage, hydrocephalus, extra-axial collection, or herniation.  Diffuse hypoattenuation white matter in the left-greater-than-right cerebrum is stable in distribution in comparison with T2 signal abnormality on the prior MRI of the brain given differences in technique. There is stable mass effect in the left anterior frontal lobe corresponding to the region of resected glioblastoma. Suboptimal assessment of the tumor in the absence of contrast. Vascular: No hyperdense vessel or unexpected calcification. Skull: Stable postsurgical changes related to left frontal craniotomy. Sinuses/Orbits: No acute finding. Other: None. ASPECTS [facility] Stroke Program Early CT Score) - Ganglionic level infarction (caudate, lentiform nuclei, internal capsule, insula, M1-M3 cortex): 7 - Supraganglionic infarction (M4-M6 cortex): 3 Total score (0-10 with 10 being normal): 10  IMPRESSION: 1. No acute stroke or hemorrhage identified. 2. ASPECTS is 10 3. extensive white matter changes, mass effect, and postsurgical changes of the left frontal lobe related to history of glioblastoma and resection are grossly stable from prior MRI given differences in technique. These results were communicated to Dr. Cheral Marker at 3:48 amon 6/15/2020by text page via the [facility] messaging system. Electronically Signed   By: Kristine Garbe M.D.   On: 12/29/2018 03:51    Pending Labs Unresulted Labs (From admission, onward)    Start     Ordered   12/29/18 0522  SARS Coronavirus 2 (CEPHEID - Performed in [facility] hospital lab), Hosp Order  (Asymptomatic Patients Labs)  Once,   STAT    Question:  Rule Out  Answer:  Yes   12/29/18 0521          Vitals/Pain Today's Vitals   12/29/18 0431 12/29/18 0445 12/29/18 0500 12/29/18 0522  BP: 131/75 125/74 138/67   Pulse: 72 84 85   Resp: 20 17 19    Temp:      TempSrc:      SpO2: 94% 94% 96%   Weight:      Height:      PainSc: 0-No pain   Asleep    Isolation Precautions No active isolations  Medications Medications  sodium chloride flush (NS) 0.9  % injection 3 mL (3 mLs Intravenous Not Given 12/29/18 0413)  levETIRAcetam (KEPPRA) 2,000 mg in sodium chloride 0.9 % 100 mL IVPB (0 mg Intravenous Stopped 12/29/18 0413)  iohexol (OMNIPAQUE) 350 MG/ML injection 115 mL (115 mLs Intravenous Contrast Given 12/29/18 0348)    Mobility walks with device High fall risk   Focused Assessments PATIENT IS DISORIENTED X4   R Recommendations: See Admitting Provider Note  Report given to:   Additional Notes:

## 2018-12-29 NOTE — Evaluation (Signed)
Clinical/Bedside Swallow Evaluation Patient Details  Name: Christopher Morris MRN: [id number] Date of Birth: [birth year]  Today's Date: 12/29/2018 Time: SLP Start Time (ACUTE ONLY): 0845 SLP Stop Time (ACUTE ONLY): 0900 SLP Time Calculation (min) (ACUTE ONLY): 15 min  Past Medical History:  Past Medical History:  Diagnosis Date  . Brain cancer ([place])   . Glioblastoma ([place]) 09/25/2017  . Hyperglycemia 09/25/2017  . Seizure ([place]) 09/25/2017   Past Surgical History:  Past Surgical History:  Procedure Laterality Date  . APPLICATION OF CRANIAL NAVIGATION N/A 09/30/2017   Procedure: APPLICATION OF CRANIAL NAVIGATION;  Surgeon: [name], MD;  Location: [place];  Service: Neurosurgery;  Laterality: N/A;  . CRANIOTOMY N/A 09/30/2017   Procedure: CRANIOTOMY FOR RESECTION OF BRAIN TUMOR WITH STEREOTACTIC;  Surgeon: [name], MD;  Location: [place];  Service: Neurosurgery;  Laterality: N/A;  CRANIOTOMY FOR RESECTION OF BRAIN TUMOR WITH STEREOTACTIC   HPI:  Patient is an 78 y.o. male with PMH: brain glioblastoma, s/p craniotomy with tumor resection, radiation and chemotherapy, PE on Eliquis, seizure, on Keppra, who presented with AMS, aphasia, right facial droop and right UE rigidity. At baseline, patient is partially dependent for most of his ADL's per daughter's report. MR brain did not reveal any acute infarct or other acute intracranial abnormality; unchanged irregularly enhancing lesion in the left frontal lobe with unchanged extensive white matter T2 signal abnormality which could reflect a combination of edema and nonenhancing tumor.   Assessment / Plan / Recommendation Clinical Impression  Patient presents with an oropharyngeal swallow that is WFL-WNL without any overt s/s of penetration or aspiration and without difficulty with mastication or oral control, manipulation and transit of tested boluses (thin liquids, puree solids, regular solids). He will benefit from assistance with meals  secondary to ideational and motor apraxia. SLP Visit Diagnosis: Dysphagia, unspecified (R13.10)    Aspiration Risk  No limitations    Diet Recommendation Thin liquid;Regular   Liquid Administration via: Cup;Straw Medication Administration: Whole meds with liquid Supervision: Staff to assist with self feeding;Full supervision/cueing for compensatory strategies Compensations: Minimize environmental distractions;Slow rate;Small sips/bites Postural Changes: Seated upright at 90 degrees    Other  Recommendations Oral Care Recommendations: Oral care BID   Follow up Recommendations None      Frequency and Duration   N/A         Prognosis   N/A     Swallow Study   General Date of Onset: 12/29/18 HPI: Patient is an 78 y.o. male with PMH: brain glioblastoma, s/p craniotomy with tumor resection, radiation and chemotherapy, PE on Eliquis, seizure, on Keppra, who presented with AMS, aphasia, right facial droop and right UE rigidity. At baseline, patient is partially dependent for most of his ADL's per daughter's report. MR brain did not reveal any acute infarct or other acute intracranial abnormality; unchanged irregularly enhancing lesion in the left frontal lobe with unchanged extensive white matter T2 signal abnormality which could reflect a combination of edema and nonenhancing tumor. Type of Study: Bedside Swallow Evaluation Previous Swallow Assessment: N/A Diet Prior to this Study: NPO Temperature Spikes Noted: No Respiratory Status: Room air History of Recent Intubation: No Behavior/Cognition: Alert;Cooperative;Pleasant mood Oral Cavity Assessment: Within Functional Limits Oral Care Completed by SLP: Yes Oral Cavity - Dentition: Adequate natural dentition Vision: Functional for self-feeding Self-Feeding Abilities: Able to feed self;Needs set up Patient Positioning: Upright in bed Baseline Vocal Quality: Normal Volitional Cough: Cognitively unable to elicit Volitional Swallow:  Unable to elicit    Oral/Motor/Sensory Function  Overall Oral Motor/Sensory Function: Within functional limits   Ice Chips     Thin Liquid Thin Liquid: Within functional limits Presentation: Self Fed;Straw Other Comments: No overt s/s of penetration or aspiration observed    Nectar Thick     Honey Thick     Puree Puree: Within functional limits Other Comments: Patient with ideational apraxia, trying to drink from spoon like a straw, unable to utilize spoon to eat applesauce   Solid     Solid: Within functional limits      Dannial Monarch 12/29/2018,7:11 PM   Sonia Baller, MA, [facility] Pager: [phone]

## 2018-12-29 NOTE — ED Notes (Addendum)
Patient received Keppra 2 grams IV while at CT scan .

## 2018-12-29 NOTE — Progress Notes (Signed)
EEG Complete  Results Pending 

## 2018-12-29 NOTE — Evaluation (Signed)
Occupational Therapy Evaluation Patient Details Name: Christopher Morris MRN: [id number] DOB: 26-May-1941 Today's Date: 12/29/2018    History of Present Illness Patient is a 78 y/o male who presents with acute onset of confusion and RUE posturing without volitional RUE movement. DD: stroke vs seizure. Head CT-unremarkable. Brain MRI- Unchanged irregularly enhancing lesion in the left frontal lobe with unchanged extensive white matter T2 signal abnormality could reflect a combination of edema and nonenhancing tumor. PMH includes glioblastoma s/p crani 09/2017 and radiation, acute PE, DVT, seizure.   Clinical Impression   Pt admitted with above diagnoses, with motor planning deficits, decreased activity tolerance, and decreased cognition limiting ability to engage in BADL at desired level of ind. Pt is poor historian, stating he is here due to tumor (previous, not current) he is also not able to provide detailed home set up. Unsure of pts cognition at baseline, will need follow up with family to confirm. Expressive communication also noted as a difficulty with pt. Per chart review, pt somewhat dependent for BADL. He states he can shower and dress ind using shower seat, needing assist for IADL. Throughout session, noted motor planning deficits. Pt using inappropriate utensils in hand and showing R inattention throughout session, needing VC/TC to correct. When feeding, pt picking up entire pancake in R hand and unaware, while eating sausage with L hand. When given utensils, pt unable to coordinate and apply appropriate usage, setting them back down and using hands. Pt remained unaware of pancake in R hand until cued otherwise. Proprioception tested and not intact. Pt often uses LUE to support lifting RUE (suspect mobility issues with this extremity at baseline due to this action). Pt needing min A with functional mobility to manage RW in environment and not bump into objects around. Recommend pt receive HHOT vs CIR  at d/c after follow up with family for baseline and their ability to provide assist. Will continue to follow per POC.      Follow Up Recommendations  Home health OT;CIR;Other (comment)(vs pending care family can provide at baseline)    Equipment Recommendations       Recommendations for Other Services       Precautions / Restrictions Precautions Precautions: Fall Precaution Comments: right inattention; apraxia Restrictions Weight Bearing Restrictions: No      Mobility Bed Mobility Overal bed mobility: Needs Assistance Bed Mobility: Supine to Sit;Sit to Supine     Supine to sit: Min guard;HOB elevated Sit to supine: Supervision   General bed mobility comments: increased time to manage and coordinate RLE  Transfers Overall transfer level: Needs assistance Equipment used: Rolling walker (2 wheeled) Transfers: Sit to/from Stand Sit to Stand: Min guard;Min assist         General transfer comment: min guard- min A considering slight impulsivity and difficulty managing RLE/RUE. Pt not following commands consistently    Balance Overall balance assessment: Needs assistance Sitting-balance support: Feet supported;No upper extremity supported Sitting balance-Leahy Scale: Good     Standing balance support: During functional activity;Bilateral upper extremity supported Standing balance-Leahy Scale: Fair Standing balance comment: more stable with external support                           ADL either performed or assessed with clinical judgement   ADL Overall ADL's : Needs assistance/impaired Eating/Feeding: Minimal assistance;Cueing for sequencing;Sitting Eating/Feeding Details (indicate cue type and reason): cues to manage apraxia; utensil manipulation- holds food in R hand without utensil unless cued Grooming:  Minimal assistance;Cueing for sequencing;Standing Grooming Details (indicate cue type and reason): to manage apraxia symptoms standing Upper Body  Bathing: Minimal assistance;Sitting;Cueing for sequencing   Lower Body Bathing: Cueing for sequencing;Sit to/from stand;Cueing for safety;Sitting/lateral leans;Moderate assistance   Upper Body Dressing : Minimal assistance;Sitting;Cueing for sequencing Upper Body Dressing Details (indicate cue type and reason): cueing needed to manage RUE to don gown Lower Body Dressing: Moderate assistance;Sit to/from stand;Sitting/lateral leans Lower Body Dressing Details (indicate cue type and reason): to don socks sitting EOB; able to don RLE at mod A, needing assist for LLE Toilet Transfer: Minimal assistance;Cueing for sequencing;Ambulation;RW   Toileting- Clothing Manipulation and Hygiene: Set up;Sit to/from stand;Sitting/lateral lean   Tub/ Shower Transfer: Minimal assistance;Ambulation;Shower seat   Functional mobility during ADLs: Minimal assistance;Rolling walker General ADL Comments: pt needing min A to navigate environment with RW considering R inattention and apraxia     Vision Baseline Vision/History: No visual deficits Additional Comments: reports no vision issues, none noted in session     Perception     Praxis      Pertinent Vitals/Pain Pain Assessment: No/denies pain     Hand Dominance     Extremity/Trunk Assessment Upper Extremity Assessment Upper Extremity Assessment: RUE deficits/detail RUE Deficits / Details: apraxia; proprioception not intact RUE Coordination: decreased fine motor;decreased gross motor   Lower Extremity Assessment Lower Extremity Assessment: Defer to PT evaluation       Communication Communication Communication: Expressive difficulties   Cognition Arousal/Alertness: Awake/alert Behavior During Therapy: WFL for tasks assessed/performed Overall Cognitive Status: Impaired/Different from baseline Area of Impairment: Orientation;Attention;Following commands;Safety/judgement;Awareness;Problem solving                 Orientation Level:  Situation;Time Current Attention Level: Sustained   Following Commands: Follows one step commands inconsistently;Follows one step commands with increased time;Follows multi-step commands inconsistently Safety/Judgement: Decreased awareness of safety;Decreased awareness of deficits Awareness: Intellectual Problem Solving: Difficulty sequencing;Requires verbal cues;Requires tactile cues;Slow processing General Comments: Unsure of baseline, decreased awareness of R side and R environment. Difficulty splitting attention. Poor historian, thinks he is here for tumor. Needing short close ended questions   General Comments  VSS throughout. Of note, pt HOH.    Exercises     Shoulder Instructions      Home Living Family/patient expects to be discharged to:: Private residence Living Arrangements: Children Available Help at Discharge: Family;Available PRN/intermittently Type of Home: House Home Access: Stairs to enter [employer] of Steps: 1   Home Layout: One level     Bathroom Shower/Tub: Occupational psychologist: Standard     Home Equipment: Environmental consultant - 4 wheels;Shower seat          Prior Functioning/Environment Level of Independence: Needs assistance  Gait / Transfers Assistance Needed: Uses rollator for functional mobility ADL's / Homemaking Assistance Needed: reports that he does ADLs with shower seat, children help with IADL            OT Problem List: Decreased strength;Decreased knowledge of use of DME or AE;Decreased coordination;Decreased activity tolerance;Decreased cognition;Impaired UE functional use;Impaired balance (sitting and/or standing);Decreased safety awareness      OT Treatment/Interventions: Self-care/ADL training;Therapeutic exercise;Patient/family education;Balance training;Neuromuscular education;Energy conservation;Therapeutic activities;DME and/or AE instruction;Cognitive remediation/compensation    OT Goals(Current goals can be  found in the care plan section) Acute Rehab OT Goals Patient Stated Goal: to get changed OT Goal Formulation: With patient Time For Goal Achievement: 01/12/19 Potential to Achieve Goals: Good  OT Frequency: Min 2X/week   Barriers to  D/C:            Co-evaluation              AM-PAC OT "6 Clicks" Daily Activity     Outcome Measure Help from another person eating meals?: A Little Help from another person taking care of personal grooming?: A Little Help from another person toileting, which includes using toliet, bedpan, or urinal?: A Little Help from another person bathing (including washing, rinsing, drying)?: A Lot Help from another person to put on and taking off regular upper body clothing?: A Little Help from another person to put on and taking off regular lower body clothing?: A Lot 6 Click Score: 16   End of Session Equipment Utilized During Treatment: Gait belt;Rolling walker Nurse Communication: Mobility status  Activity Tolerance: Patient tolerated treatment well Patient left: in chair;with call bell/phone within reach  OT Visit Diagnosis: Unsteadiness on feet (R26.81);Other abnormalities of gait and mobility (R26.89);Other symptoms and signs involving cognitive function;Other (comment)(motor planning)                Time: 9030-0923 OT Time Calculation (min): 32 min Charges:  OT General Charges $OT Visit: 1 Visit OT Evaluation $OT Eval Moderate Complexity: 1 Mod OT Treatments $Self Care/Home Management : 8-22 mins  Zenovia Jarred, MSOT, OTR/L Behavioral Health OT/ Acute Relief OT [employer] Office: [place] 12/29/2018, 1:36 PM

## 2018-12-30 DIAGNOSIS — R569 Unspecified convulsions: Secondary | ICD-10-CM | POA: Diagnosis not present

## 2018-12-30 DIAGNOSIS — R4701 Aphasia: Secondary | ICD-10-CM | POA: Diagnosis not present

## 2018-12-30 LAB — CBC WITH DIFFERENTIAL/PLATELET
Abs Immature Granulocytes: 0.01 10*3/uL (ref 0.00–0.07)
Basophils Absolute: 0 10*3/uL (ref 0.0–0.1)
Basophils Relative: 1 %
Eosinophils Absolute: 0.1 10*3/uL (ref 0.0–0.5)
Eosinophils Relative: 1 %
HCT: 37.9 % — ABNORMAL LOW (ref 39.0–52.0)
Hemoglobin: 12.7 g/dL — ABNORMAL LOW (ref 13.0–17.0)
Immature Granulocytes: 0 %
Lymphocytes Relative: 31 %
Lymphs Abs: 1.6 10*3/uL (ref 0.7–4.0)
MCH: 29.5 pg (ref 26.0–34.0)
MCHC: 33.5 g/dL (ref 30.0–36.0)
MCV: 87.9 fL (ref 80.0–100.0)
Monocytes Absolute: 0.6 10*3/uL (ref 0.1–1.0)
Monocytes Relative: 12 %
Neutro Abs: 2.8 10*3/uL (ref 1.7–7.7)
Neutrophils Relative %: 55 %
Platelets: 163 10*3/uL (ref 150–400)
RBC: 4.31 MIL/uL (ref 4.22–5.81)
RDW: 12.7 % (ref 11.5–15.5)
WBC: 5.1 10*3/uL (ref 4.0–10.5)
nRBC: 0 % (ref 0.0–0.2)

## 2018-12-30 LAB — RENAL FUNCTION PANEL
Albumin: 3.5 g/dL (ref 3.5–5.0)
Anion gap: 9 (ref 5–15)
BUN: 6 mg/dL — ABNORMAL LOW (ref 8–23)
CO2: 25 mmol/L (ref 22–32)
Calcium: 8.9 mg/dL (ref 8.9–10.3)
Chloride: 104 mmol/L (ref 98–111)
Creatinine, Ser: 0.86 mg/dL (ref 0.61–1.24)
GFR calc Af Amer: >60 mL/min (ref >60)
GFR calc non Af Amer: >60 mL/min (ref >60)
Glucose, Bld: 112 mg/dL — ABNORMAL HIGH (ref 70–99)
Phosphorus: 3.8 mg/dL (ref 2.5–4.6)
Potassium: 3.4 mmol/L — ABNORMAL LOW (ref 3.5–5.1)
Sodium: 138 mmol/L (ref 135–145)

## 2018-12-30 LAB — MAGNESIUM: Magnesium: 1.9 mg/dL (ref 1.7–2.4)

## 2018-12-30 MED ORDER — LEVETIRACETAM 750 MG PO TABS
1250.0000 mg | ORAL_TABLET | Freq: Two times a day (BID) | ORAL | Status: DC
  Administered 2018-12-30: 1250 mg via ORAL
  Filled 2018-12-30: qty 1

## 2018-12-30 MED ORDER — LEVETIRACETAM 250 MG PO TABS: 1250.0000 mg | ORAL_TABLET | Freq: Two times a day (BID) | ORAL | 2 refills | Status: DC

## 2018-12-30 MED FILL — levETIRAcetam 250 MG TABS: 250 | 30 days supply | Qty: 300 | Fill #0

## 2018-12-30 NOTE — TOC Transition Note (Signed)
Transition of Care [facility]) - CM/SW Discharge Note   Patient Details  Name: Christopher Morris MRN: [id number] Date of Birth: 1940/08/18  Transition of Care [facility] CM/SW Contact:  Pollie Friar, RN Phone Number: 12/30/2018, 11:46 AM   Clinical Narrative:    Pt discharging home. Daughter to provide transportation home. Pt has his eliquis with his belongings.    Final next level of care: Home w Home Health Services Barriers to Discharge: No Barriers Identified   Patient Goals and CMS Choice   CMS Medicare.gov Compare Post Acute Care list provided to:: Patient Represenative (must comment)(daughter) Choice offered to / list presented to : Adult Children  Discharge Placement                       Discharge Plan and Services   Discharge Planning Services: CM Consult Post Acute Care Choice: Home Health                    HH Arranged: PT, OT, Speech Therapy [place]: [facility] [employer] Date [place]: 12/30/18 Time [place]: 1140 Representative spoke with at [place]: [place] ([place]) Interventions     Readmission Risk Interventions No flowsheet data found.

## 2018-12-30 NOTE — Discharge Summary (Signed)
. Physician Discharge Summary  Christopher Morris ELF:[id number] DOB: 1941-07-04 DOA: 12/29/2018  [facility]  Admit date: 12/29/2018 Discharge date: 12/30/2018  Admitted From: Home Disposition:  Discharged to home with [facility]  Recommendations for Outpatient Follow-up:  1. Follow up with PCP in 1-2 weeks 2. Please obtain BMP/CBC in one week  Discharge Condition: Stable  CODE STATUS: FULL   Brief/Interim Summary: Christopher Morris is a 78 y.o. male with medical history significant of brain glioblastoma (s/p Craniotomy with tumor resection, radiation and chemotherapy), PE on Eliquis, seizure on Keppra, who presents with altered mental status, aphasia, right facial droop and right upper arm rigidity.  Patient has difficulty speaking and altered mental status, cannot provide accurate medical history.  I called her daughter, who helped collecting the medical history.   Per his daughter, patient was noted to be confused and fell in bathroom at about 3:00 AM  He has difficulty speaking and was noted to have right facial droop.  Does not seem to have vision loss or hearing loss.  His right arm was rigid and posturing. Pt seems to have had tremor-like movements. Per her daughter, patient does not seem to have chest pain, shortness of breath, cough, fever or chills.  No nausea vomiting or diarrhea or abdominal pain.  Discharge Diagnoses:  Principal Problem:   Aphasia Active Problems:   Seizure ([place])   Frontal glioblastoma multiforme (HCC)   Pulmonary embolism (HCC)   Acute metabolic encephalopathy  Aphasia and acute metabolic encephalopathy:      - pt presents AMS, aphasia, right facial droop and right upper arm rigidity.       - Also had possible seizure-like activity per report.     - Neurology, Dr. Cheral Marker was consulted, who recommended load patient with Keppra by IV and get brain MRI with and without contrast.     - MR Brain w/o acute infarct     - EEG: mild global slowing and  mild global encephalopathy      - SLP eval: He will benefit from skilled speech-language therapy to improve his ability to communicate wants/needs/thoughts, safely and effectively perform functional tasks and to follow basic level instructions/directions.     - PT/OT recs SNF vs home w/ 24-hr supervision     - per neuro: His current presentation is most likely consistent with seizure emanating from the left cerebral hemisphere, because he was exhibiting dense aphasia and right-sided weakness on arrival which has since improved-likely Todd's paralysis at the time; increase his Keppra to 1250 twice daily; He should follow-up with Dr. Mickeal Skinner in clinic in 4 to 6 weeks after discharge.  Seizure     - Seizure precaution     - When necessary Ativan for seizure     - Continue Home medications: keppra; increase to 1250mg  BID.  Frontal glioblastoma multiforme [facility]):      - s/p of craniotomy resection, chemo and radiation therapy.     - outpt Follow-up with oncologist     - MRI of the brain with and without contrast shows no acute infarct.  It showed unchanged irregularly enhancing lesion in the left frontal lobe with unchanged extensive T2 signal change in the white matter, abnormality which could reflect combination of edema and nonenhancing tumor.  Pulmonary embolism ([place]):     - continue eliquis now  Discharge Instructions   Allergies as of 12/30/2018      Reactions   Pork-derived Products    Patient does not eat pork  Shellfish Allergy    Patient does not eat shellfish      Medication List    STOP taking these medications   ondansetron 8 MG tablet Commonly known as: Zofran   temozolomide 140 MG capsule Commonly known as: TEMODAR   temozolomide 20 MG capsule Commonly known as: TEMODAR     TAKE these medications   apixaban 5 MG Tabs tablet Commonly known as: ELIQUIS Take 10 mg BID till 12/24/2017, start taking 5 mg BID from 12/25/2017 What changed:   how much to  take  how to take this  when to take this  additional instructions   levETIRAcetam 250 MG tablet Commonly known as: KEPPRA Take 5 tablets (1,250 mg total) by mouth 2 (two) times daily. What changed:   medication strength  how much to take   vitamin B-12 500 MCG tablet Commonly known as: CYANOCOBALAMIN Take 500 mcg by mouth daily.       Allergies  Allergen Reactions  . Pork-Derived Products     Patient does not eat pork  . Shellfish Allergy     Patient does not eat shellfish    Consultations:  Neurology   Procedures/Studies: Ct Code Stroke Cta Head W/wo Contrast  Result Date: 12/29/2018 CLINICAL DATA:  Right facial droop.  History of glioblastoma. EXAM: CT ANGIOGRAPHY HEAD AND NECK CT PERFUSION BRAIN TECHNIQUE: Multidetector CT imaging of the head and neck was performed using the standard protocol during bolus administration of intravenous contrast. Multiplanar CT image reconstructions and MIPs were obtained to evaluate the vascular anatomy. Carotid stenosis measurements (when applicable) are obtained utilizing NASCET criteria, using the distal internal carotid diameter as the denominator. Multiphase CT imaging of the brain was performed following IV bolus contrast injection. Subsequent parametric perfusion maps were calculated using RAPID software. CONTRAST:  161mL OMNIPAQUE IOHEXOL 350 MG/ML SOLN COMPARISON:  Head CT 12/29/2018 and MRI 11/17/2018. FINDINGS: CTA NECK FINDINGS Aortic arch: Standard 3 vessel aortic arch with mild atherosclerotic plaque. Widely patent arch vessel origins. Right carotid system: Patent without evidence of stenosis or dissection. Minimal atherosclerotic plaque in the carotid bulb. Retropharyngeal course of the distal common and proximal internal carotid arteries. Left carotid system: Patent without evidence of stenosis or dissection. Partially retropharyngeal course of the distal common and proximal external carotid arteries. Vertebral arteries:  Patent and codominant without evidence of stenosis or dissection. Skeleton: Advanced diffuse cervical disc degeneration. Other neck: No evidence of cervical lymphadenopathy or mass. Upper chest: Clear lung apices. Review of the MIP images confirms the above findings CTA HEAD FINDINGS Anterior circulation: The internal carotid arteries are patent from skull base to carotid termini with nonstenotic plaque more notable on the left. ACAs and MCAs are patent with mild branch vessel irregularity but no evidence of proximal branch occlusion or significant proximal stenosis. No aneurysm is identified. Posterior circulation: The intracranial vertebral arteries are patent to the basilar without significant stenosis. Patent right PICA, left AICA, and bilateral SCA origins are identified. The basilar artery is widely patent. Posterior communicating arteries are diminutive or absent. PCAs are patent with moderate P2 stenoses bilaterally. No aneurysm is identified. Venous sinuses: As permitted by contrast timing, patent. Anatomic variants: None. Review of the MIP images confirms the above findings CT Brain Perfusion Findings: ASPECTS: 10 CBF (<30%) Volume: 36mL Perfusion (Tmax>6.0s) volume: 73mL Mismatch Volume: 79mL Infarction Location:Left cerebral hemisphere primarily involving frontal and parietal lobes in the MCA territory as well as possibly some ACA involvement. This perfusion abnormality is predominantly within subcortical white  matter with scattered areas of cortical involvement and partially corresponds to the patient's known glioblastoma and white matter changes on MRI. IMPRESSION: 1. No emergent large vessel occlusion. 2. Moderate bilateral P2 stenoses. 3. No significant proximal stenosis in the anterior intracranial circulation. 4. Widely patent cervical carotid and vertebral arteries. 5. Altered perfusion in the left cerebral hemisphere particularly involving frontoparietal subcortical white matter. While this may  reflect an acute infarct with small amount of penumbra, the patient's known underlying glioblastoma and edema/post treatment changes may also be contributing to the altered perfusion. 6.  Aortic Atherosclerosis (ICD10-I70.0). These results were called by telephone at the time of interpretation on 12/29/2018 at 4:20 am to Dr. Kerney Elbe , who verbally acknowledged these results. Electronically Signed   By: Logan Bores M.D.   On: 12/29/2018 04:29   Ct Code Stroke Cta Neck W/wo Contrast  Result Date: 12/29/2018 CLINICAL DATA:  Right facial droop.  History of glioblastoma. EXAM: CT ANGIOGRAPHY HEAD AND NECK CT PERFUSION BRAIN TECHNIQUE: Multidetector CT imaging of the head and neck was performed using the standard protocol during bolus administration of intravenous contrast. Multiplanar CT image reconstructions and MIPs were obtained to evaluate the vascular anatomy. Carotid stenosis measurements (when applicable) are obtained utilizing NASCET criteria, using the distal internal carotid diameter as the denominator. Multiphase CT imaging of the brain was performed following IV bolus contrast injection. Subsequent parametric perfusion maps were calculated using RAPID software. CONTRAST:  136mL OMNIPAQUE IOHEXOL 350 MG/ML SOLN COMPARISON:  Head CT 12/29/2018 and MRI 11/17/2018. FINDINGS: CTA NECK FINDINGS Aortic arch: Standard 3 vessel aortic arch with mild atherosclerotic plaque. Widely patent arch vessel origins. Right carotid system: Patent without evidence of stenosis or dissection. Minimal atherosclerotic plaque in the carotid bulb. Retropharyngeal course of the distal common and proximal internal carotid arteries. Left carotid system: Patent without evidence of stenosis or dissection. Partially retropharyngeal course of the distal common and proximal external carotid arteries. Vertebral arteries: Patent and codominant without evidence of stenosis or dissection. Skeleton: Advanced diffuse cervical disc  degeneration. Other neck: No evidence of cervical lymphadenopathy or mass. Upper chest: Clear lung apices. Review of the MIP images confirms the above findings CTA HEAD FINDINGS Anterior circulation: The internal carotid arteries are patent from skull base to carotid termini with nonstenotic plaque more notable on the left. ACAs and MCAs are patent with mild branch vessel irregularity but no evidence of proximal branch occlusion or significant proximal stenosis. No aneurysm is identified. Posterior circulation: The intracranial vertebral arteries are patent to the basilar without significant stenosis. Patent right PICA, left AICA, and bilateral SCA origins are identified. The basilar artery is widely patent. Posterior communicating arteries are diminutive or absent. PCAs are patent with moderate P2 stenoses bilaterally. No aneurysm is identified. Venous sinuses: As permitted by contrast timing, patent. Anatomic variants: None. Review of the MIP images confirms the above findings CT Brain Perfusion Findings: ASPECTS: 10 CBF (<30%) Volume: 22mL Perfusion (Tmax>6.0s) volume: 34mL Mismatch Volume: 4mL Infarction Location:Left cerebral hemisphere primarily involving frontal and parietal lobes in the MCA territory as well as possibly some ACA involvement. This perfusion abnormality is predominantly within subcortical white matter with scattered areas of cortical involvement and partially corresponds to the patient's known glioblastoma and white matter changes on MRI. IMPRESSION: 1. No emergent large vessel occlusion. 2. Moderate bilateral P2 stenoses. 3. No significant proximal stenosis in the anterior intracranial circulation. 4. Widely patent cervical carotid and vertebral arteries. 5. Altered perfusion in the left cerebral hemisphere particularly  involving frontoparietal subcortical white matter. While this may reflect an acute infarct with small amount of penumbra, the patient's known underlying glioblastoma and  edema/post treatment changes may also be contributing to the altered perfusion. 6.  Aortic Atherosclerosis (ICD10-I70.0). These results were called by telephone at the time of interpretation on 12/29/2018 at 4:20 am to Dr. Kerney Elbe , who verbally acknowledged these results. Electronically Signed   By: Logan Bores M.D.   On: 12/29/2018 04:29   [name] JA Contrast  Result Date: 12/29/2018 CLINICAL DATA:  Fall with altered mental status, aphasia, and right upper extremity posturing. History of glioblastoma. EXAM: MRI HEAD WITHOUT AND WITH CONTRAST TECHNIQUE: Multiplanar, multiecho pulse sequences of the brain and surrounding structures were obtained without and with intravenous contrast. CONTRAST:  9 mL Gadavist COMPARISON:  Head CT, CTA, and CTP 12/29/2018.  Head MRI 11/17/2018. FINDINGS: Brain: There is no acute infarct, midline shift, or extra-axial fluid collection. There is moderate cerebral atrophy. Sequelae of left frontal craniotomy and tumor resection are again identified with chronic blood products at the left frontal resection site. Extensive nonenhancing T2 hyperintensity involving the left greater than right cerebral white matter is unchanged with mild left-sided sulcal effacement and mild mass effect on the left lateral ventricle again noted. Irregular enhancement at the left frontal resection site extending to the superior margin of the left lateral ventricle has not significantly changed in extent, with a maximal diameter of 3.4 cm on axial images. A small focus of restricted diffusion along the posterior aspect of the enhancement is stable to slightly decreased. No abnormal enhancement is identified remote from the resection site. Vascular: Major intracranial vascular flow voids are preserved. Skull and upper cervical spine: Right frontal craniotomy. No suspicious marrow lesion. Advanced upper cervical disc and facet degeneration. Sinuses/Orbits: Unremarkable orbits. Paranasal sinuses and  mastoid air cells are clear. Other: Unchanged 7 mm cystic focus in the left parotid gland. IMPRESSION: 1. No acute infarct or other acute intracranial abnormality. 2. Unchanged irregularly enhancing lesion in the left frontal lobe with unchanged extensive white matter T2 signal abnormality which could reflect a combination of edema and nonenhancing tumor. Electronically Signed   By: Logan Bores M.D.   On: 12/29/2018 07:26   Ct Code Stroke Cta Cerebral Perfusion W/wo Contrast  Result Date: 12/29/2018 CLINICAL DATA:  Right facial droop.  History of glioblastoma. EXAM: CT ANGIOGRAPHY HEAD AND NECK CT PERFUSION BRAIN TECHNIQUE: Multidetector CT imaging of the head and neck was performed using the standard protocol during bolus administration of intravenous contrast. Multiplanar CT image reconstructions and MIPs were obtained to evaluate the vascular anatomy. Carotid stenosis measurements (when applicable) are obtained utilizing NASCET criteria, using the distal internal carotid diameter as the denominator. Multiphase CT imaging of the brain was performed following IV bolus contrast injection. Subsequent parametric perfusion maps were calculated using RAPID software. CONTRAST:  154mL OMNIPAQUE IOHEXOL 350 MG/ML SOLN COMPARISON:  Head CT 12/29/2018 and MRI 11/17/2018. FINDINGS: CTA NECK FINDINGS Aortic arch: Standard 3 vessel aortic arch with mild atherosclerotic plaque. Widely patent arch vessel origins. Right carotid system: Patent without evidence of stenosis or dissection. Minimal atherosclerotic plaque in the carotid bulb. Retropharyngeal course of the distal common and proximal internal carotid arteries. Left carotid system: Patent without evidence of stenosis or dissection. Partially retropharyngeal course of the distal common and proximal external carotid arteries. Vertebral arteries: Patent and codominant without evidence of stenosis or dissection. Skeleton: Advanced diffuse cervical disc degeneration. Other  neck: No evidence  of cervical lymphadenopathy or mass. Upper chest: Clear lung apices. Review of the MIP images confirms the above findings CTA HEAD FINDINGS Anterior circulation: The internal carotid arteries are patent from skull base to carotid termini with nonstenotic plaque more notable on the left. ACAs and MCAs are patent with mild branch vessel irregularity but no evidence of proximal branch occlusion or significant proximal stenosis. No aneurysm is identified. Posterior circulation: The intracranial vertebral arteries are patent to the basilar without significant stenosis. Patent right PICA, left AICA, and bilateral SCA origins are identified. The basilar artery is widely patent. Posterior communicating arteries are diminutive or absent. PCAs are patent with moderate P2 stenoses bilaterally. No aneurysm is identified. Venous sinuses: As permitted by contrast timing, patent. Anatomic variants: None. Review of the MIP images confirms the above findings CT Brain Perfusion Findings: ASPECTS: 10 CBF (<30%) Volume: 51mL Perfusion (Tmax>6.0s) volume: 82mL Mismatch Volume: 8mL Infarction Location:Left cerebral hemisphere primarily involving frontal and parietal lobes in the MCA territory as well as possibly some ACA involvement. This perfusion abnormality is predominantly within subcortical white matter with scattered areas of cortical involvement and partially corresponds to the patient's known glioblastoma and white matter changes on MRI. IMPRESSION: 1. No emergent large vessel occlusion. 2. Moderate bilateral P2 stenoses. 3. No significant proximal stenosis in the anterior intracranial circulation. 4. Widely patent cervical carotid and vertebral arteries. 5. Altered perfusion in the left cerebral hemisphere particularly involving frontoparietal subcortical white matter. While this may reflect an acute infarct with small amount of penumbra, the patient's known underlying glioblastoma and edema/post treatment  changes may also be contributing to the altered perfusion. 6.  Aortic Atherosclerosis (ICD10-I70.0). These results were called by telephone at the time of interpretation on 12/29/2018 at 4:20 am to Dr. Kerney Elbe , who verbally acknowledged these results. Electronically Signed   By: Logan Bores M.D.   On: 12/29/2018 04:29   Ct Head Code Stroke Wo Contrast  Result Date: 12/29/2018 CLINICAL DATA:  Code stroke. 78 y/o M; altered mental status and right-sided facial droop as well as right arm contracture. Possible stroke. History of glioblastoma post resection. EXAM: CT HEAD WITHOUT CONTRAST TECHNIQUE: Contiguous axial images were obtained from the base of the skull through the vertex without intravenous contrast. COMPARISON:  11/17/2018 MRI of the head.  11/13/2017 CT of the head. FINDINGS: Brain: No acute stroke, hemorrhage, hydrocephalus, extra-axial collection, or herniation. Diffuse hypoattenuation white matter in the left-greater-than-right cerebrum is stable in distribution in comparison with T2 signal abnormality on the prior MRI of the brain given differences in technique. There is stable mass effect in the left anterior frontal lobe corresponding to the region of resected glioblastoma. Suboptimal assessment of the tumor in the absence of contrast. Vascular: No hyperdense vessel or unexpected calcification. Skull: Stable postsurgical changes related to left frontal craniotomy. Sinuses/Orbits: No acute finding. Other: None. ASPECTS [facility] Stroke Program Early CT Score) - Ganglionic level infarction (caudate, lentiform nuclei, internal capsule, insula, M1-M3 cortex): 7 - Supraganglionic infarction (M4-M6 cortex): 3 Total score (0-10 with 10 being normal): 10 IMPRESSION: 1. No acute stroke or hemorrhage identified. 2. ASPECTS is 10 3. extensive white matter changes, mass effect, and postsurgical changes of the left frontal lobe related to history of glioblastoma and resection are grossly stable from prior  MRI given differences in technique. These results were communicated to Dr. Cheral Marker at 3:48 amon 6/15/2020by text page via the [facility] messaging system. Electronically Signed   By: Kristine Garbe M.D.   On:  12/29/2018 03:51     Subjective: "It's ok."  Discharge Exam: Vitals:   12/30/18 0407 12/30/18 0840  BP: 116/73 116/67  Pulse: 67 71  Resp: 15 19  Temp: (!) 97.5 F (36.4 C) 98.4 F (36.9 C)  SpO2: 99% 97%   Vitals:   12/29/18 2343 12/30/18 0005 12/30/18 0407 12/30/18 0840  BP: 119/78 122/73 116/73 116/67  Pulse: 68 67 67 71  Resp:  14 15 19   Temp: 98.5 F (36.9 C) 98.9 F (37.2 C) (!) 97.5 F (36.4 C) 98.4 F (36.9 C)  TempSrc: Oral Oral Oral Oral  SpO2: 94% 96% 99% 97%  Weight:      Height:        General: 78 y.o. male resting in bed in NAD Cardiovascular: RRR, +S1, S2, no m/g/r, equal pulses throughout Respiratory: CTABL, no w/r/r, normal WOB GI: BS+, NDNT, no masses noted, no organomegaly noted MSK: No e/c/c Skin: No rashes, bruises, ulcerations noted Neuro: A&O x 3, no focal deficits    The results of significant diagnostics from this hospitalization (including imaging, microbiology, ancillary and laboratory) are listed below for reference.     Microbiology: Recent Results (from the past 240 hour(s))  SARS Coronavirus 2     Status: None   Collection Time: 12/29/18  5:28 AM  Result Value Ref Range Status   SARS Coronavirus 2 NOT DETECTED NOT DETECTED Final    Comment: (NOTE) SARS-CoV-2 target nucleic acids are NOT DETECTED. The SARS-CoV-2 RNA is generally detectable in upper and lower respiratory specimens during the acute phase of infection.  Negative  results do not preclude SARS-CoV-2 infection, do not rule out co-infections with other pathogens, and should not be used as the sole basis for treatment or other patient management decisions.  Negative results must be combined with clinical observations, patient history, and epidemiological  information. The expected result is Not Detected. Fact Sheet for Patients: http://www.biofiredefense.com/wp-content/uploads/2020/03/BIOFIRE-COVID -19-patients.pdf Fact Sheet for Healthcare Providers: http://www.biofiredefense.com/wp-content/uploads/2020/03/BIOFIRE-COVID -19-hcp.pdf This test is not yet approved or cleared by the [place] and  has been authorized for detection and/or diagnosis of SARS-CoV-2 by FDA under an Emergency Use Authorization (EUA).  This EUA will remain in effec t (meaning this test can be used) for the duration of  the COVID-19 declaration under Section 564(b)(1) of the Act, 21 U.S.C. section 360bbb-3(b)(1), unless the authorization is terminated or revoked sooner. Performed at [facility], [place] [address], [place], [place] [address]      Labs: BNP (last 3 results) No results for input(s): BNP in the last 8760 hours. Basic Metabolic Panel: Recent Labs  Lab 12/29/18 0335 12/29/18 0346 12/29/18 0356 12/30/18 0452  NA 140 139  --  138  K 3.9 3.8  --  3.4*  CL 104 105  --  104  CO2 24  --   --  25  GLUCOSE 122* 117*  --  112*  BUN 10 12  --  6*  CREATININE 1.11 1.00 1.00 0.86  CALCIUM 9.7  --   --  8.9  MG  --   --   --  1.9  PHOS  --   --   --  3.8   Liver Function Tests: Recent Labs  Lab 12/29/18 0335 12/30/18 0452  AST 20  --   ALT 12  --   ALKPHOS 81  --   BILITOT 0.9  --   PROT 7.1  --   ALBUMIN 4.4 3.5   No results for input(s):  LIPASE, AMYLASE in the last 168 hours. No results for input(s): AMMONIA in the last 168 hours. CBC: Recent Labs  Lab 12/29/18 0335 12/29/18 0346 12/30/18 0452  WBC 7.5  --  5.1  NEUTROABS 3.1  --  2.8  HGB 14.3 14.6 12.7*  HCT 43.0 43.0 37.9*  MCV 89.6  --  87.9  PLT 195  --  163   Cardiac Enzymes: No results for input(s): CKTOTAL, CKMB, CKMBINDEX, TROPONINI in the last 168 hours. BNP: Invalid input(s): POCBNP CBG: Recent Labs  Lab 12/29/18 0332  GLUCAP 111*   D-Dimer No  results for input(s): DDIMER in the last 72 hours. Hgb A1c No results for input(s): HGBA1C in the last 72 hours. Lipid Profile No results for input(s): CHOL, HDL, LDLCALC, TRIG, CHOLHDL, LDLDIRECT in the last 72 hours. Thyroid function studies No results for input(s): TSH, T4TOTAL, T3FREE, THYROIDAB in the last 72 hours.  Invalid input(s): FREET3 Anemia work up No results for input(s): VITAMINB12, FOLATE, FERRITIN, TIBC, IRON, RETICCTPCT in the last 72 hours. Urinalysis    Component Value Date/Time   COLORURINE YELLOW 12/18/2017 0227   APPEARANCEUR CLEAR 12/18/2017 0227   LABSPEC 1.012 12/18/2017 0227   PHURINE 5.0 12/18/2017 0227   GLUCOSEU NEGATIVE 12/18/2017 0227   HGBUR NEGATIVE 12/18/2017 0227   BILIRUBINUR NEGATIVE 12/18/2017 0227   KETONESUR NEGATIVE 12/18/2017 0227   PROTEINUR NEGATIVE 12/18/2017 0227   NITRITE NEGATIVE 12/18/2017 0227   LEUKOCYTESUR NEGATIVE 12/18/2017 0227   Sepsis Labs Invalid input(s): PROCALCITONIN,  WBC,  LACTICIDVEN Microbiology Recent Results (from the past 240 hour(s))  SARS Coronavirus 2     Status: None   Collection Time: 12/29/18  5:28 AM  Result Value Ref Range Status   SARS Coronavirus 2 NOT DETECTED NOT DETECTED Final    Comment: (NOTE) SARS-CoV-2 target nucleic acids are NOT DETECTED. The SARS-CoV-2 RNA is generally detectable in upper and lower respiratory specimens during the acute phase of infection.  Negative  results do not preclude SARS-CoV-2 infection, do not rule out co-infections with other pathogens, and should not be used as the sole basis for treatment or other patient management decisions.  Negative results must be combined with clinical observations, patient history, and epidemiological information. The expected result is Not Detected. Fact Sheet for Patients: http://www.biofiredefense.com/wp-content/uploads/2020/03/BIOFIRE-COVID -19-patients.pdf Fact Sheet for Healthcare  Providers: http://www.biofiredefense.com/wp-content/uploads/2020/03/BIOFIRE-COVID -19-hcp.pdf This test is not yet approved or cleared by the [place] and  has been authorized for detection and/or diagnosis of SARS-CoV-2 by FDA under an Emergency Use Authorization (EUA).  This EUA will remain in effec t (meaning this test can be used) for the duration of  the COVID-19 declaration under Section 564(b)(1) of the Act, 21 U.S.C. section 360bbb-3(b)(1), unless the authorization is terminated or revoked sooner. Performed at [facility], [place] [address] [place], [place] [address]      Time coordinating discharge: 35 minutes  SIGNED:   Jonnie Finner, DO  [facility] 12/30/2018, 11:06 AM Pager   If 7PM-7AM, please contact night-coverage [email or web address] Password [email or web address]

## 2018-12-30 NOTE — Plan of Care (Signed)
Patient adequate for discharge.

## 2018-12-30 NOTE — Progress Notes (Signed)
Occupational Therapy Treatment Patient Details Name: Christopher Morris MRN: [id number] DOB: 19-Dec-1940 Today's Date: 12/30/2018    History of present illness Patient is a 78 y/o male who presents with acute onset of confusion and RUE posturing without volitional RUE movement. DD: stroke vs seizure. Head CT-unremarkable. Brain MRI- Unchanged irregularly enhancing lesion in the left frontal lobe with unchanged extensive white matter T2 signal abnormality could reflect a combination of edema and nonenhancing tumor. PMH includes glioblastoma s/p crani 09/2017 and radiation, acute PE, DVT, seizure.   OT comments  Pt using R UE this session for functional task but demonstrates R inattention with transfers. Pt walking into doorways, chairs and even staff in the hall with RW. Pt lacks awareness to correcting R inattention. Pt able to correct with min cues. Pt transferred min to min guard (A) throughout session with mod cues.    Follow Up Recommendations  Home health OT    Equipment Recommendations  Other (comment)(RW)    Recommendations for Other Services      Precautions / Restrictions Precautions Precautions: Fall Precaution Comments: R inattention/ apraxia       Mobility Bed Mobility Overal bed mobility: Needs Assistance Bed Mobility: Supine to Sit     Supine to sit: Supervision     General bed mobility comments: pt able to follow command and progress to EOB with hob 30 degrees no rail  Transfers Overall transfer level: Needs assistance Equipment used: Rolling walker (2 wheeled) Transfers: Sit to/from Stand Sit to Stand: Supervision              Balance             Standing balance-Leahy Scale: Fair Standing balance comment: leaning against sink surface                           ADL either performed or assessed with clinical judgement   ADL Overall ADL's : Needs assistance/impaired     Grooming: Min guard;Standing Grooming Details (indicate cue type  and reason): able to retrieve objects on right side with verbal cues and completed two step command                 Toilet Transfer: Minimal assistance;RW           Functional mobility during ADLs: Min guard;Rolling walker General ADL Comments: pt bumping into objects on R side with decr awareness in the moment but when directly asked which side appears to be harder pt able to verbalized "right side" pt however does not make self corrections for R side errors.      Vision       Perception     Praxis      Cognition Arousal/Alertness: Awake/alert Behavior During Therapy: WFL for tasks assessed/performed Overall Cognitive Status: No family/caregiver present to determine baseline cognitive functioning                                          Exercises     Shoulder Instructions       General Comments      Pertinent Vitals/ Pain       Pain Assessment: No/denies pain  Home Living  Prior Functioning/Environment              Frequency  Min 2X/week        Progress Toward Goals  OT Goals(current goals can now be found in the care plan section)  Progress towards OT goals: Progressing toward goals  Acute Rehab OT Goals Patient Stated Goal: to get changed OT Goal Formulation: With patient Time For Goal Achievement: 01/12/19 Potential to Achieve Goals: Good ADL Goals Pt Will Perform Grooming: with min guard assist;standing Pt Will Perform Upper Body Bathing: with min guard assist;sitting Pt Will Perform Lower Body Bathing: with min assist;sit to/from stand;sitting/lateral leans Pt Will Perform Upper Body Dressing: sitting Pt Will Perform Lower Body Dressing: with min assist;sitting/lateral leans;sit to/from stand Pt Will Transfer to Toilet: with min guard assist;regular height toilet Pt Will Perform Tub/Shower Transfer: with min guard assist;ambulating;shower seat;rolling  walker Additional ADL Goal #1: Pt will engage RUE in ADL tasks purposefully 3/5 attempts  Plan Discharge plan remains appropriate    Co-evaluation                 AM-PAC OT "6 Clicks" Daily Activity     Outcome Measure   Help from another person eating meals?: A Little Help from another person taking care of personal grooming?: A Little Help from another person toileting, which includes using toliet, bedpan, or urinal?: A Little Help from another person bathing (including washing, rinsing, drying)?: A Lot Help from another person to put on and taking off regular upper body clothing?: A Little Help from another person to put on and taking off regular lower body clothing?: A Lot 6 Click Score: 16    End of Session Equipment Utilized During Treatment: Gait belt;Rolling walker  OT Visit Diagnosis: Unsteadiness on feet (R26.81);Other abnormalities of gait and mobility (R26.89);Other symptoms and signs involving cognitive function;Other (comment)   Activity Tolerance Patient tolerated treatment well   Patient Left in bed;with bed alarm set;with call bell/phone within reach   Nurse Communication Mobility status;Precautions        Time: 1040-1100 OT Time Calculation (min): 20 min  Charges: OT General Charges $OT Visit: 1 Visit OT Treatments $Self Care/Home Management : 8-22 mins   Jeri Modena, OTR/L  Acute Rehabilitation Services Pager: [phone] Office: [phone] .    Jeri Modena 12/30/2018, 1:38 PM

## 2018-12-30 NOTE — Progress Notes (Signed)
Neurology Progress Note   S:// Patient seen and examined. No acute events overnight.  No seizures.  O:// Current vital signs: BP 116/73 (BP Location: Left Arm)   Pulse 67   Temp (!) 97.5 F (36.4 C) (Oral)   Resp 15   Ht 5\' 11"  (1.803 m)   Wt 92.2 kg   SpO2 99%   BMI 28.35 kg/m  Vital signs in last 24 hours: Temp:  [97.5 F (36.4 C)-100 F (37.8 C)] 97.5 F (36.4 C) (06/16 0407) Pulse Rate:  [65-88] 67 (06/16 0407) Resp:  [14-18] 15 (06/16 0407) BP: (107-134)/(64-78) 116/73 (06/16 0407) SpO2:  [94 %-99 %] 99 % (06/16 0407) General: Patient was sleeping in bed, easily awoke to voice, in no distress. HEENT: Normocephalic atraumatic Lungs: Clear to auscultation Extremities: No edema Neurological examination Mental status: Patient is awake alert.  He has some difficulty following commands and inconsistently follows commands.  He is able to inconsistently mimic as well.  He is able to name very simple objects such as pen and flashlight but is unable to repeat sentences. Cranial nerves: Pupils equal round reactive light, extraocular movements intact, visual fields full to confrontation-was able to count fingers without extinction on both sides, no facial asymmetry, palate midline, tongue midline.  Seems to have mildly reduced auditory acuity. Motor exam: Right upper extremity is 4/5 with vertical drift, right lower extremity also has 4/5 strength.  Left side is 5/5. Sensory exam: Intact to light touch bilaterally Coordination: Finger-nose-finger testing intact bilaterally. Gait testing deferred at this time.  Medications  Current Facility-Administered Medications:  .  acetaminophen (TYLENOL) tablet 650 mg, 650 mg, Oral, Q4H PRN **OR** acetaminophen (TYLENOL) solution 650 mg, 650 mg, Per Tube, Q4H PRN **OR** acetaminophen (TYLENOL) suppository 650 mg, 650 mg, Rectal, Q4H PRN, Ivor Costa, MD .  apixaban Arne Cleveland) tablet 5 mg, 5 mg, Oral, BID, Ivor Costa, MD, 5 mg at 12/29/18  2111 .  levETIRAcetam (KEPPRA XR) 24 hr tablet 1,000 mg, 1,000 mg, Oral, BID, Ivor Costa, MD, 1,000 mg at 12/29/18 2111 .  LORazepam (ATIVAN) injection 1 mg, 1 mg, Intravenous, Q2H PRN, Ivor Costa, MD .  senna-docusate (Senokot-S) tablet 1 tablet, 1 tablet, Oral, QHS PRN, Ivor Costa, MD .  sodium chloride flush (NS) 0.9 % injection 3 mL, 3 mL, Intravenous, Once, Ivor Costa, MD Labs CBC    Component Value Date/Time   WBC 5.1 12/30/2018 0452   RBC 4.31 12/30/2018 0452   HGB 12.7 (L) 12/30/2018 0452   HGB 14.1 10/08/2018 1137   HCT 37.9 (L) 12/30/2018 0452   PLT 163 12/30/2018 0452   PLT 216 10/08/2018 1137   MCV 87.9 12/30/2018 0452   MCH 29.5 12/30/2018 0452   MCHC 33.5 12/30/2018 0452   RDW 12.7 12/30/2018 0452   LYMPHSABS 1.6 12/30/2018 0452   MONOABS 0.6 12/30/2018 0452   EOSABS 0.1 12/30/2018 0452   BASOSABS 0.0 12/30/2018 0452    CMP     Component Value Date/Time   NA 138 12/30/2018 0452   NA 136 (A) 10/18/2017   K 3.4 (L) 12/30/2018 0452   CL 104 12/30/2018 0452   CO2 25 12/30/2018 0452   GLUCOSE 112 (H) 12/30/2018 0452   BUN 6 (L) 12/30/2018 0452   BUN 19 10/18/2017   CREATININE 0.86 12/30/2018 0452   CREATININE 0.95 10/08/2018 1137   CALCIUM 8.9 12/30/2018 0452   PROT 7.1 12/29/2018 0335   ALBUMIN 3.5 12/30/2018 0452   AST 20 12/29/2018 0335   AST  13 (L) 10/08/2018 1137   ALT 12 12/29/2018 0335   ALT 11 10/08/2018 1137   ALKPHOS 81 12/29/2018 0335   BILITOT 0.9 12/29/2018 0335   BILITOT 1.2 10/08/2018 1137   GFRNONAA >60 12/30/2018 0452   GFRNONAA >60 10/08/2018 1137   GFRAA >60 12/30/2018 0452   GFRAA >60 10/08/2018 1137   Spot EEG yesterday showed mild global slowing and mild global encephalopathy.  Imaging I have reviewed images in epic and the results pertinent to this consultation are: MRI of the brain with and without contrast shows no acute infarct.  It showed unchanged irregularly enhancing lesion in the left frontal lobe with unchanged  extensive T2 signal change in the white matter, abnormality which could reflect combination of edema and nonenhancing tumor.  Assessment: 78 year old man with past medical history of a resected left-sided brain tumor brought in with concerns for altered mental status, flexor posturing of the right upper extremity with tremor concerning for seizure.  A code stroke was called out in the field.  Patient was examined and this was determined to be a possible seizure rather than a stroke.  He was on Eliquis hence no TPA was offered irrespective. 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 I communicated with his neuro-oncologist, Dr. Mickeal Skinner, who said that he has had breakthrough seizures in the past and he gets excessively decompensated with them.  At baseline, he does have some dysphasia -and I presume that that is where he is today on my exam.  Impression: Breakthrough seizures in the setting of brain tumor (GBM) Stable postoperative changes from GBM resection Known PE  Recommendations: He was on Keppra 1 g twice daily at home.  I would increase his Keppra to 1250 twice daily. Maintain seizure precautions-listed in detail below. Continue management of PE per primary team as you are with anticoagulation. He should follow-up with Dr. Mickeal Skinner in clinic in 4 to 6 weeks after discharge. Should any seizure activity recur, and be prolonged, he should be brought back to the emergency room for evaluation.   --- Per [facility] statutes, patients with seizures are not allowed to drive until they have been seizure-free for six months.   Use caution when using heavy equipment or power tools. Avoid working on ladders or at heights. Take showers instead of baths. Ensure the water temperature is not too high  on the home water heater. Do not go swimming alone. Do not lock yourself in a room alone (i.e. bathroom). When caring for infants or small children, sit down when holding, feeding, or changing them to minimize risk of injury to the child in the event you have a seizure. Maintain good sleep hygiene. Avoid alcohol.   If patient has another seizure, call 911 and bring them back to the ED if: A. The seizure lasts longer than 5 minutes.  B. The patient doesn't wake shortly after the seizure or has new problems such as difficulty seeing, speaking or moving following the seizure C. The patient was injured during the seizure D. The patient has a temperature over 102 F (39C) E. The patient vomited during the seizure and now is having trouble breathing ---  -- Amie Portland, MD [facility] Pager: [phone] If 7pm to 7am, please call on call as listed on AMION.

## 2018-12-30 NOTE — TOC Initial Note (Signed)
Transition of Care [facility]) - Initial/Assessment Note    Patient Details  Name: Christopher Morris MRN: [id number] Date of Birth: 14-Jun-1941  Transition of Care [facility]) CM/SW Contact:    Pollie Friar, RN Phone Number: 12/30/2018, 11:46 AM  Clinical Narrative:                   Expected Discharge Plan: [place] Services Barriers to Discharge: No Barriers Identified   Patient Goals and CMS Choice   CMS Medicare.gov Compare Post Acute Care list provided to:: Patient Represenative (must comment)(daughter) Choice offered to / list presented to : Adult Children  Expected Discharge Plan and Services Expected Discharge Plan: [place]   Discharge Planning Services: CM Consult Post Acute Care Choice: Home Health   Expected Discharge Date: 12/30/18                         HH Arranged: PT, OT, Speech Therapy HH Agency: [facility] (formerly [employer]) Date [place]: 12/30/18 Time [place]: 25 Representative spoke with at [place]: [place] Arrangements/Services   Lives with:: Adult Children Patient language and need for interpreter reviewed:: Yes(no needs) Do you feel safe going back to the place where you live?: Yes      Need for Family Participation in Patient Care: Yes (Comment)(24 hour supervision recommended) Care giver support system in place?: Yes (comment)(daughter states she can provide 24 hour supervision and the assistance he needs) Current home services: DME(walker and shower seat) Criminal Activity/Legal Involvement Pertinent to Current Situation/Hospitalization: No - Comment as needed  Activities of Daily Living      Permission Sought/Granted                  Emotional Assessment Appearance:: Appears stated age Attitude/Demeanor/Rapport: Engaged Affect (typically observed): Accepting Orientation: : Oriented to Self, Oriented to Place, Oriented to Situation   Psych  Involvement: No (comment)  Admission diagnosis:  Altered level of consciousness [R40.4] Patient Active Problem List   Diagnosis Date Noted  . Acute metabolic encephalopathy 67/34/1937  . Aphasia 12/29/2018  . Pneumonia 12/22/2017  . HCAP (healthcare-associated pneumonia) 12/22/2017  . Pulmonary embolism ([place]) 12/22/2017  . Tachycardia   . Vitamin B12 deficiency 11/14/2017  . Anemia 11/14/2017  . Fever 11/13/2017  . Dehydration 11/13/2017  . Interstitial pneumonia ([place]) 11/11/2017  . Sepsis ([place]) 11/09/2017  . Bacteremia due to Gram-negative bacteria 10/26/2017  . Sepsis due to Escherichia coli (E. coli) ([place]) 10/26/2017  . UTI (urinary tract infection) 10/25/2017  . Seizure ([place]) 09/25/2017  . Frontal glioblastoma multiforme ([place]) 09/25/2017  . Hyperglycemia 09/25/2017  . BLOOD IN STOOL 07/26/2008  . ERECTILE DYSFUNCTION 07/12/2008   PCP:  [facility]   [place], [place] [address] [place] [address] Phone: [phone] Fax: [phone]     Social Determinants of Health (SDOH) Interventions    Readmission Risk Interventions No flowsheet data found.

## 2018-12-30 NOTE — Progress Notes (Signed)
Patient home medication ELIQUIS verified with patient in personal belonging bag in patient room and will be sent back home with patient

## 2018-12-30 NOTE — Care Management Obs Status (Signed)
[place] NOTIFICATION   Patient Details  Name: Christopher Morris MRN: [id number] Date of Birth: 06-16-1941   Medicare Observation Status Notification Given:  Yes    Pollie Friar, RN 12/30/2018, 11:37 AM

## 2019-01-05 ENCOUNTER — Telehealth: Payer: Self-pay | Admitting: *Deleted

## 2019-01-05 ENCOUNTER — Other Ambulatory Visit: Payer: Self-pay | Admitting: Radiation Therapy

## 2019-01-05 NOTE — Telephone Encounter (Signed)
"  [place] [place] [facility] [phone]) asking if patient's MRI Brain schedule Monday is needed.  MRI performed performed in ED 12-29-2018." Verbal order received and read back from Dr. Mickeal Skinner for [name].  He does not need MRI with MRI done last week in ED.  Order given to [facility] with MRI at this time.

## 2019-01-12 ENCOUNTER — Ambulatory Visit (HOSPITAL_COMMUNITY): Admission: RE | Admit: 2019-01-12 | Payer: Medicare Other | Source: Ambulatory Visit

## 2019-01-16 IMAGING — CR DG ABDOMEN 2V
3 series · 3 of 3 positions shown · non-contrast
Comparison: None.

CLINICAL DATA: Abdominal pain

EXAM:
ABDOMEN - 2 VIEW

[w abdomen decub (1 of 2)]
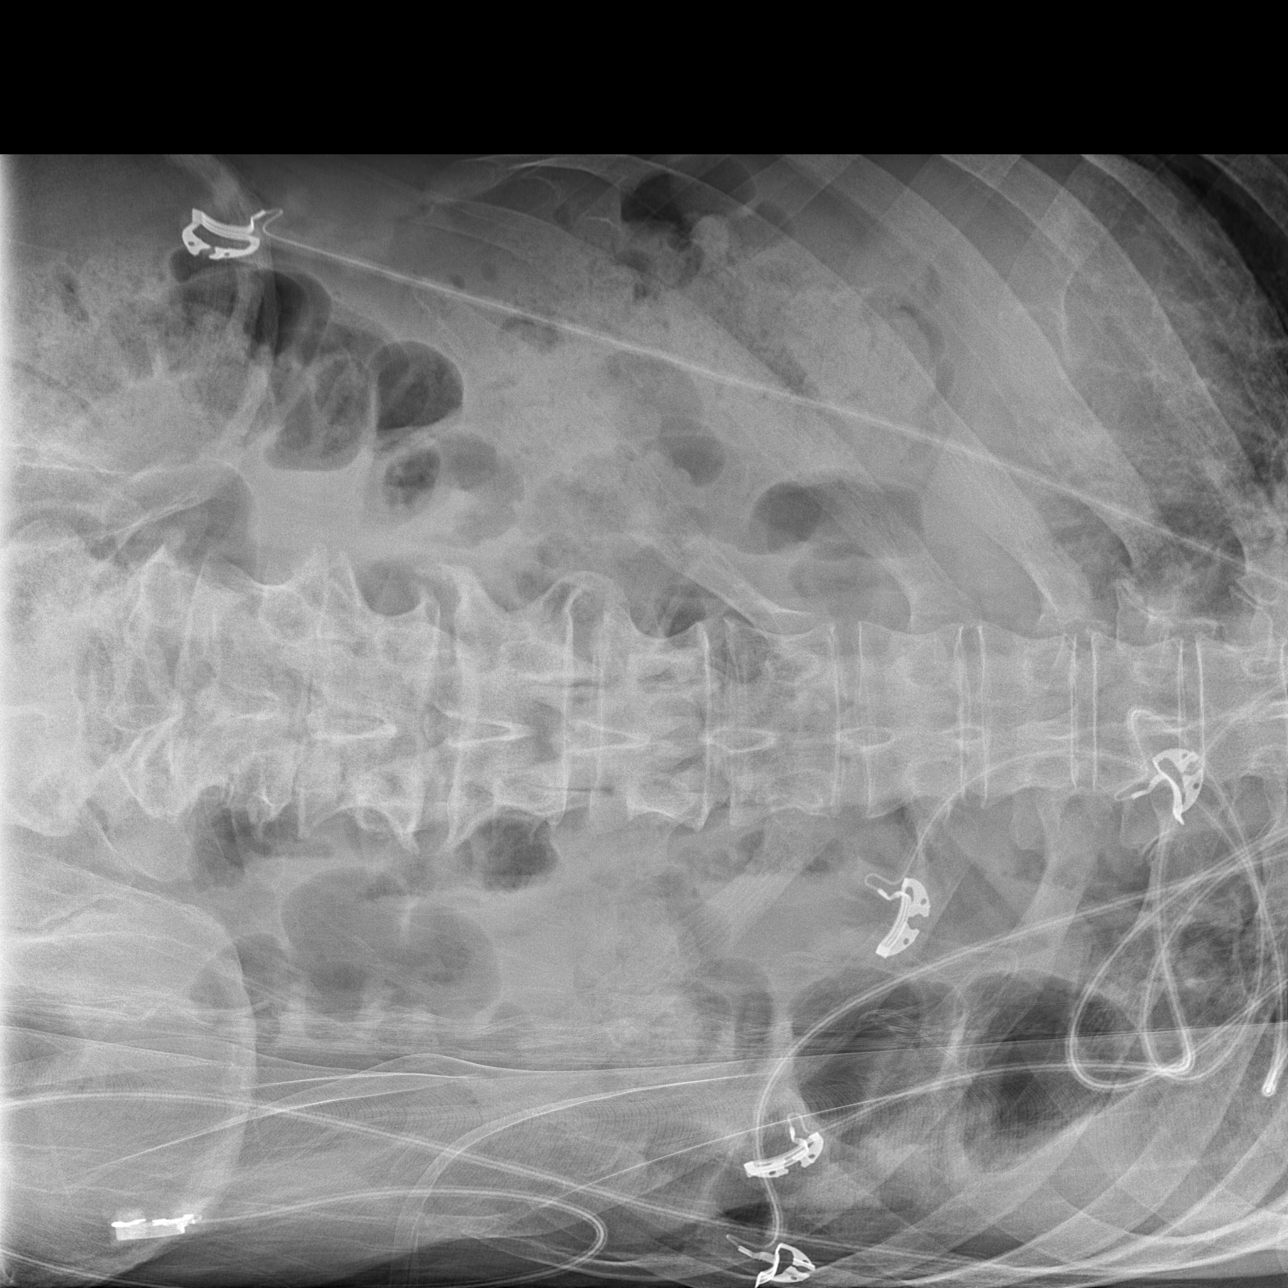

[w abdomen decub (2 of 2)]
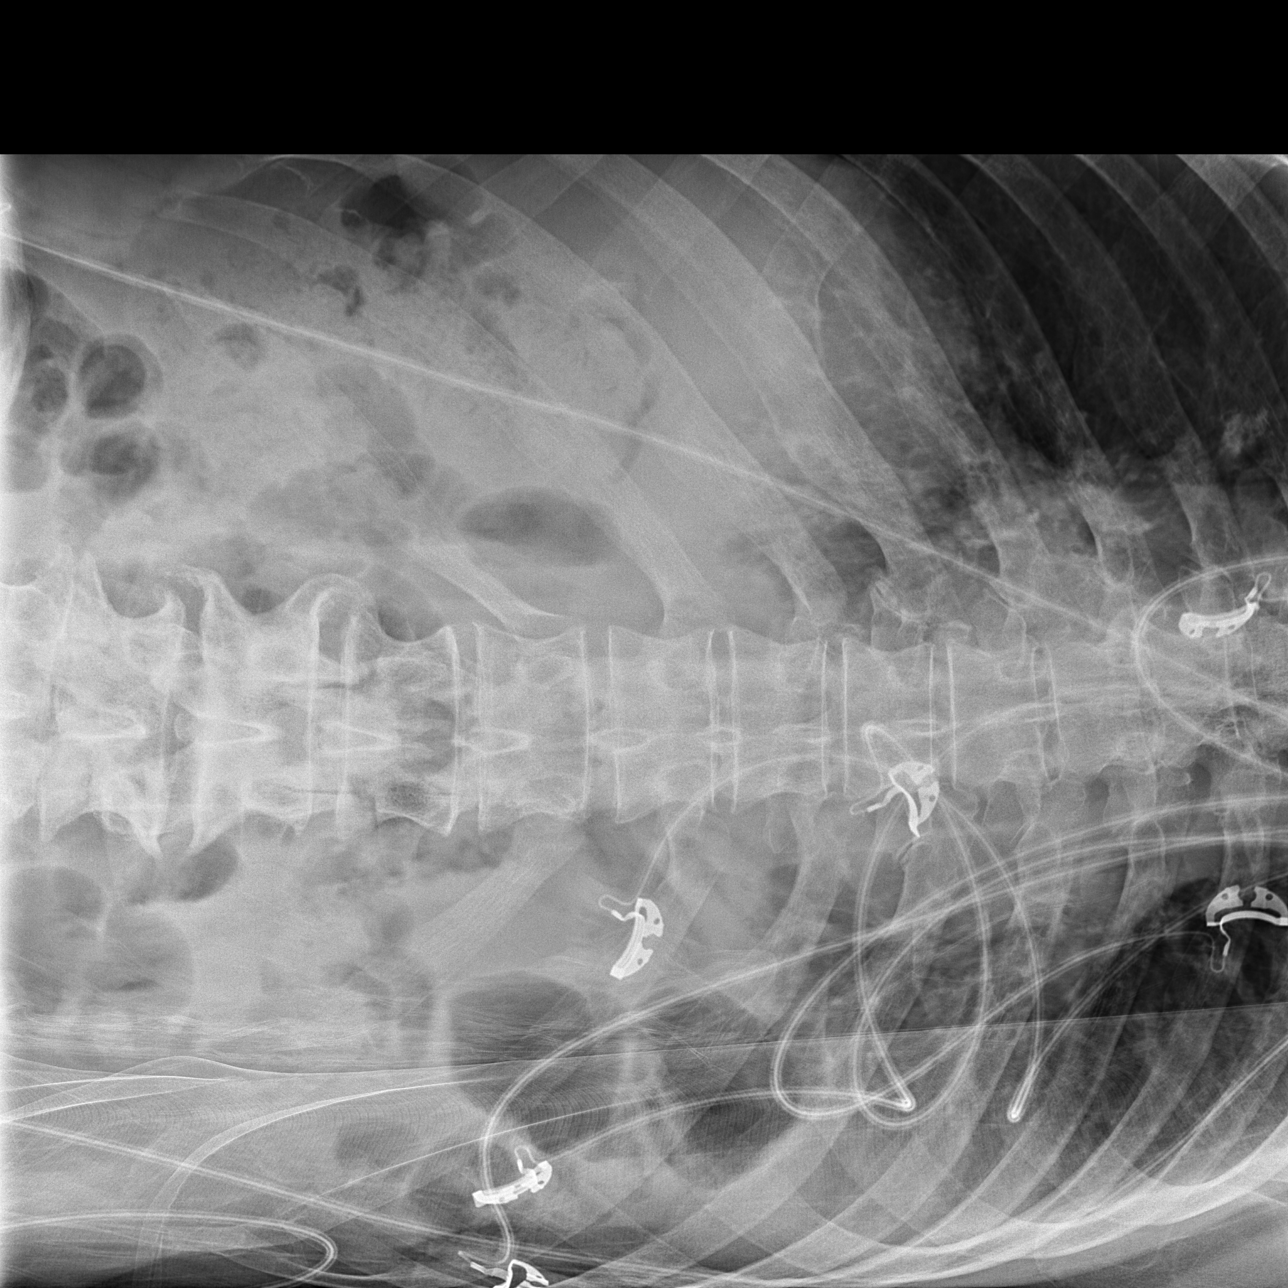

[x abdomen supine]
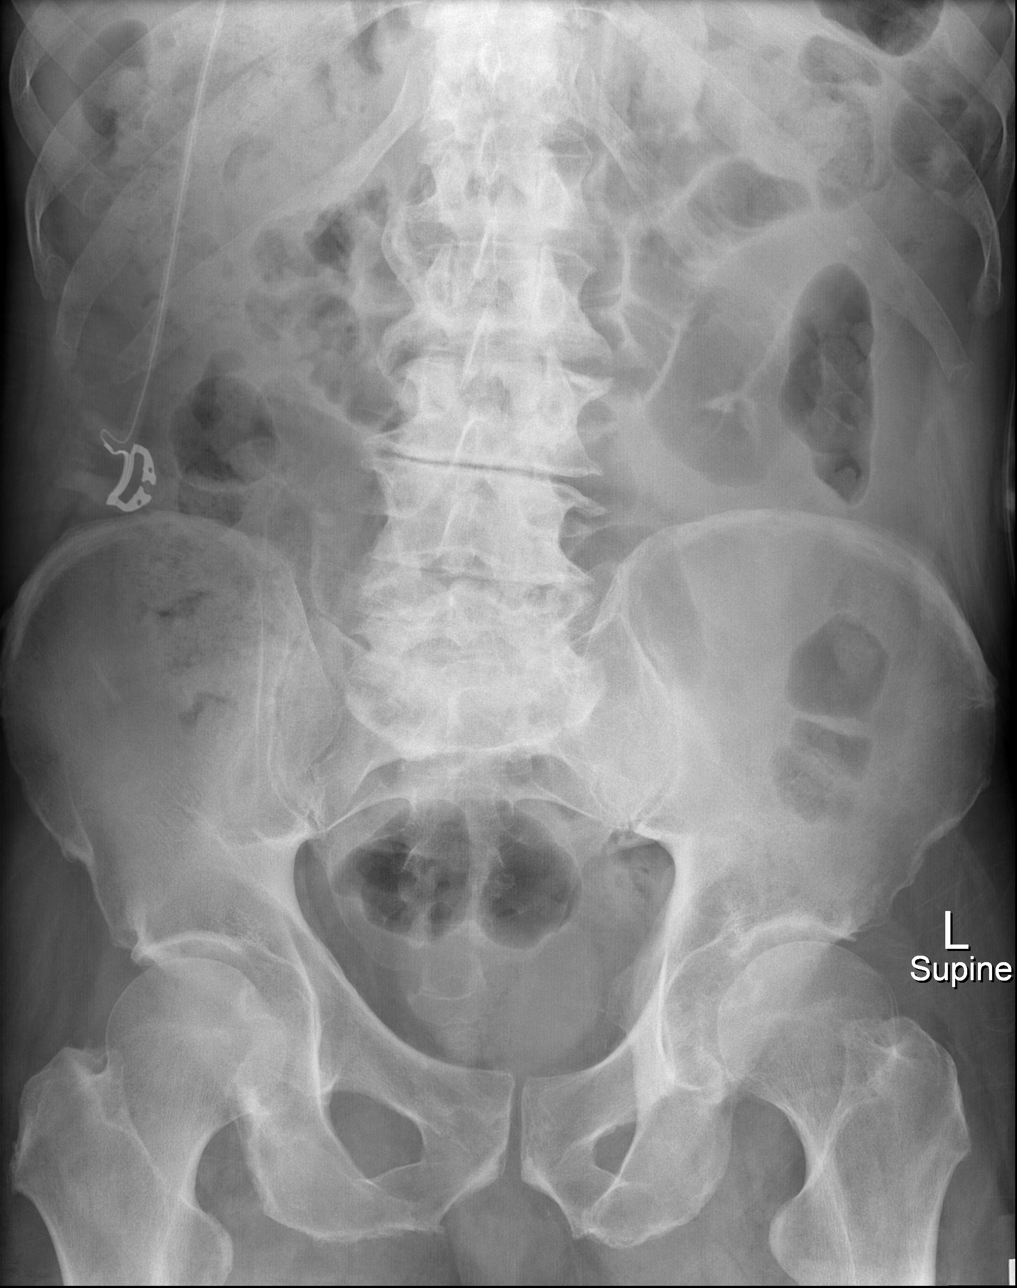

[3 of 3 positions shown; findings below may reference images not displayed]

FINDINGS: The bowel gas pattern is normal. There is no evidence of free air.
No radio-opaque calculi or other significant radiographic
abnormality is seen.
IMPRESSION: Negative.

## 2019-01-16 IMAGING — CT CT ANGIO CHEST
2 of 7 series · 17 of 46 positions shown · IV contrast (ISOVUE 370)
Comparison: Chest CT September 25, 2017; chest radiograph December 18, 2017

CLINICAL DATA: Shortness of breath and fever

EXAM:
CT ANGIOGRAPHY CHEST WITH CONTRAST
TECHNIQUE: Multidetector CT imaging of the chest was performed using the
standard protocol during bolus administration of intravenous
contrast. Multiplanar CT image reconstructions and MIPs were
obtained to evaluate the vascular anatomy.
CONTRAST:  80mL [id number] IOPAMIDOL ([id number]) INJECTION 76%

[Series 5: thins · axial · 0.71mm/px · z∈[-338,-97]mm · 15 of 275 slices shown]
[im 17/275  lung]
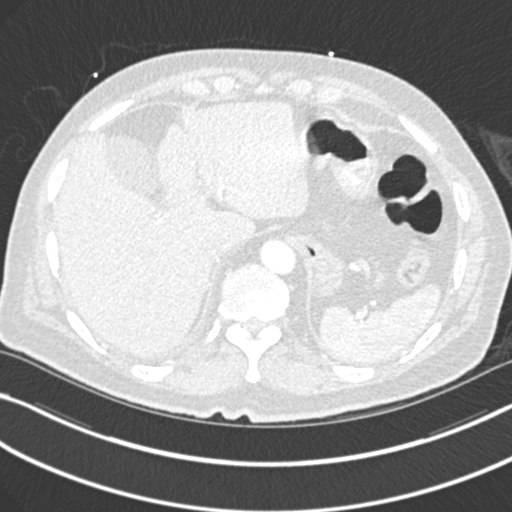
[im 33/275  soft-tissue]
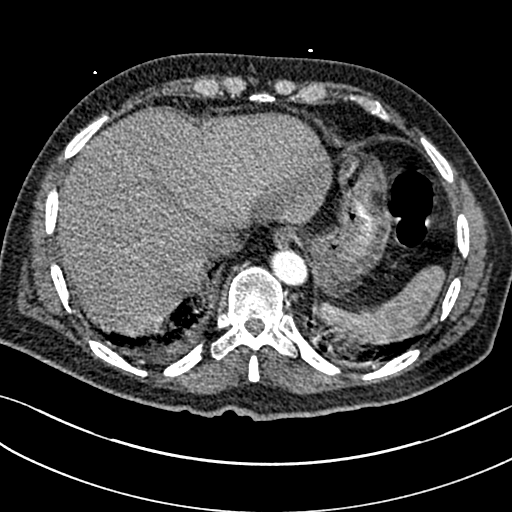
[im 49/275  lung]
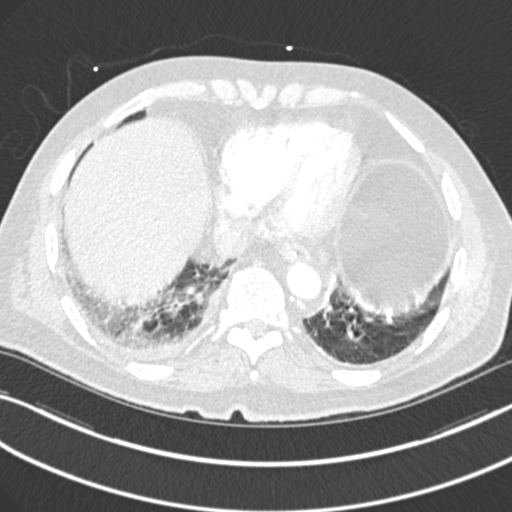
[im 65/275  soft-tissue]
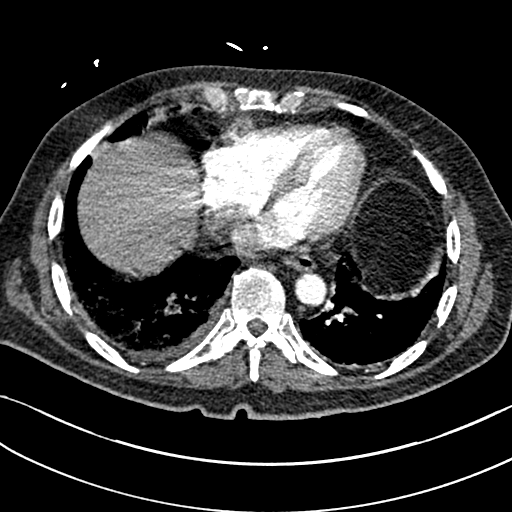
[im 81/275  lung]
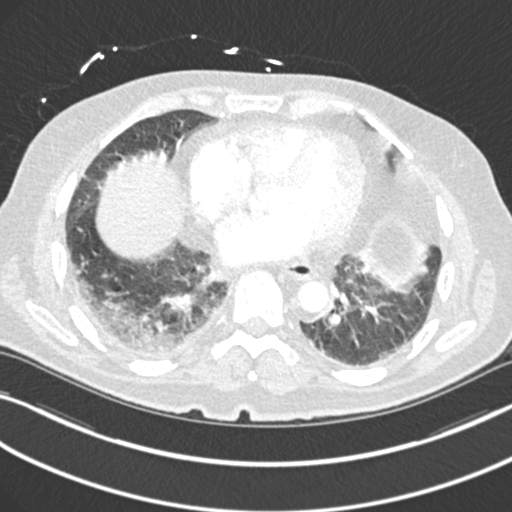
[im 97/275  soft-tissue]
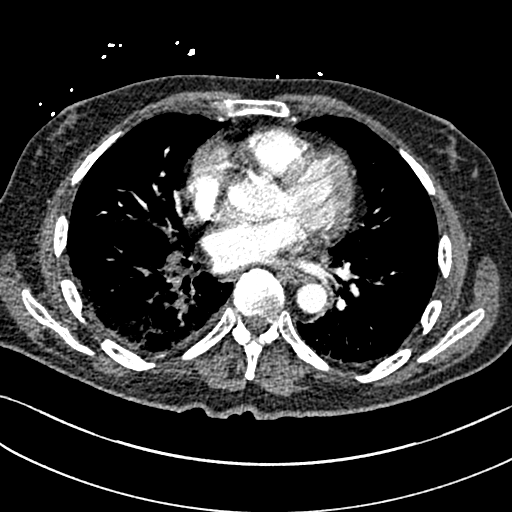
[im 113/275  lung]
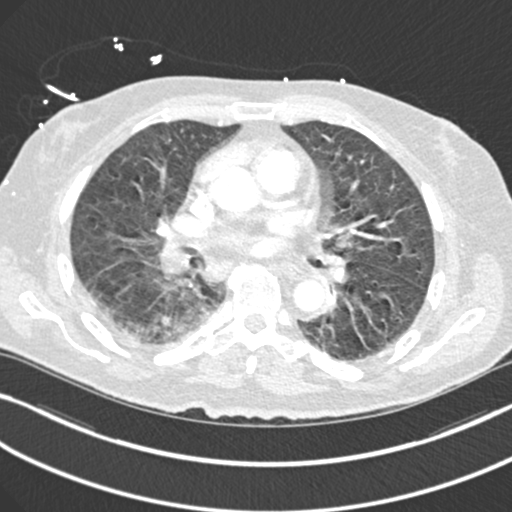
[im 146/275  soft-tissue]
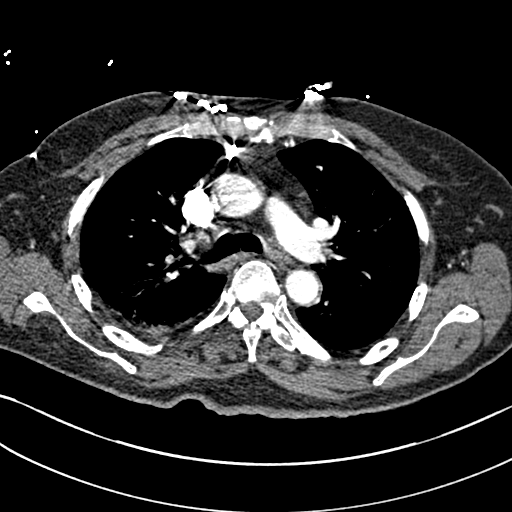
[im 162/275  lung]
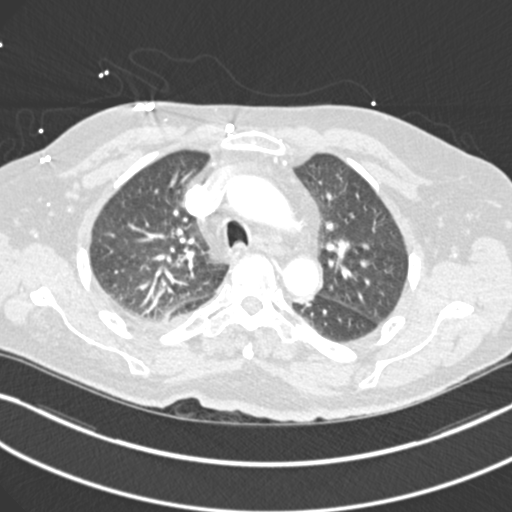
[im 178/275  soft-tissue]
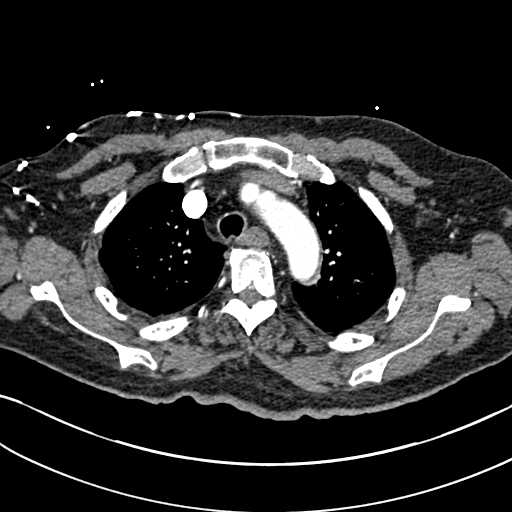
[im 194/275  lung]
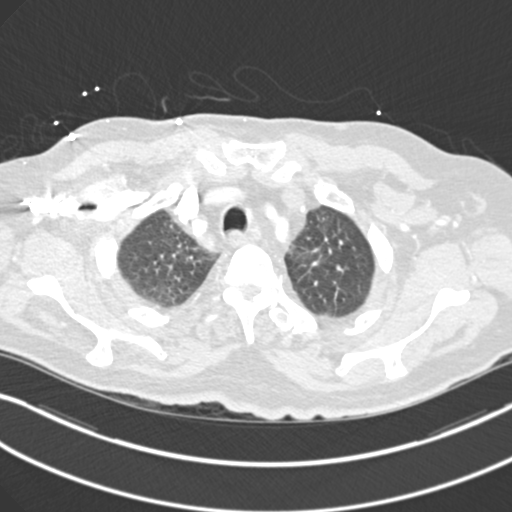
[im 210/275  soft-tissue]
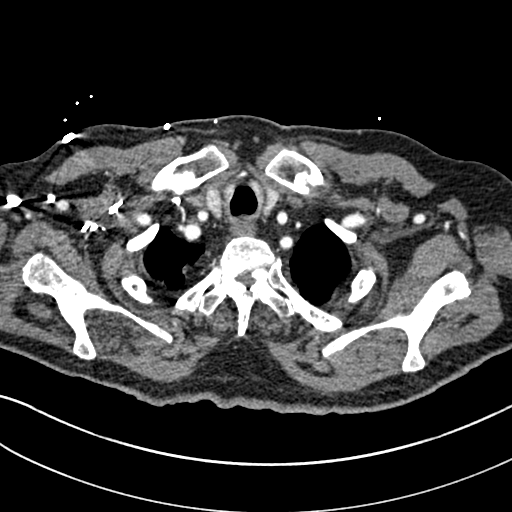
[im 226/275  lung]
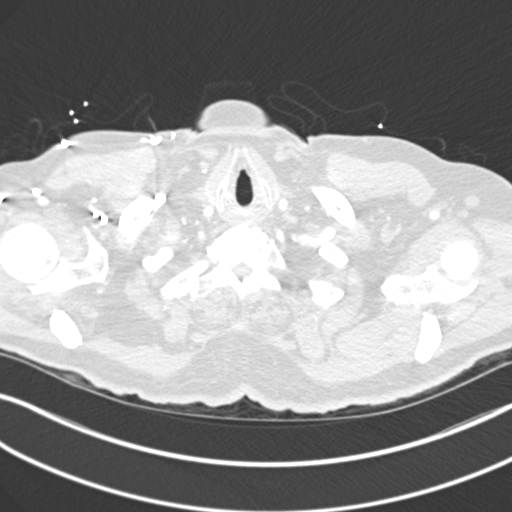
[im 242/275  soft-tissue]
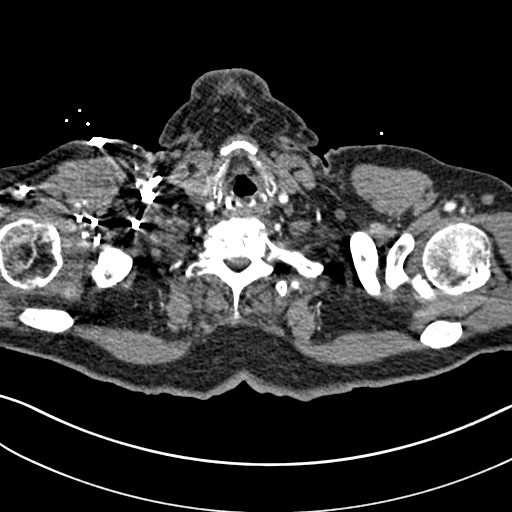
[im 258/275  lung]
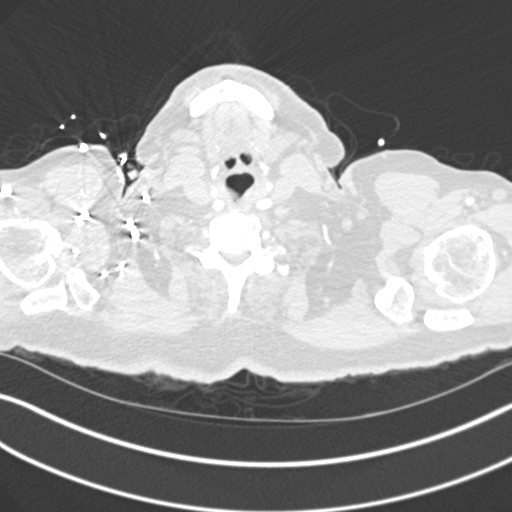

[Series 6: coronal mpr · coronal · 0.54mm/px · 2 of 88 slices shown]
[im 30/88  soft-tissue]
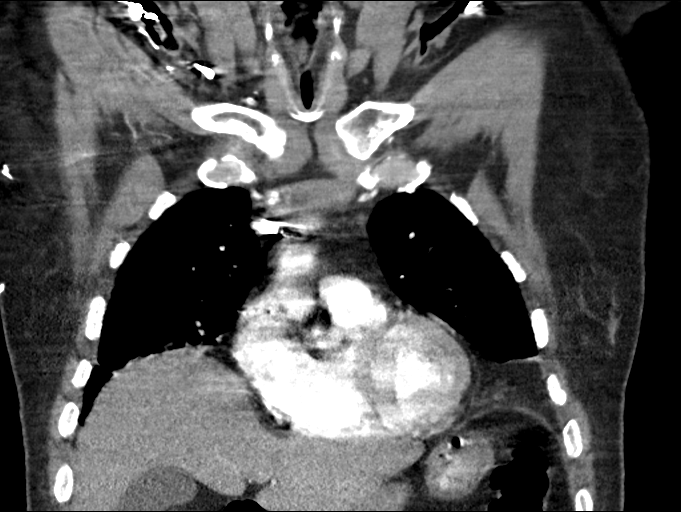
[im 59/88  soft-tissue]
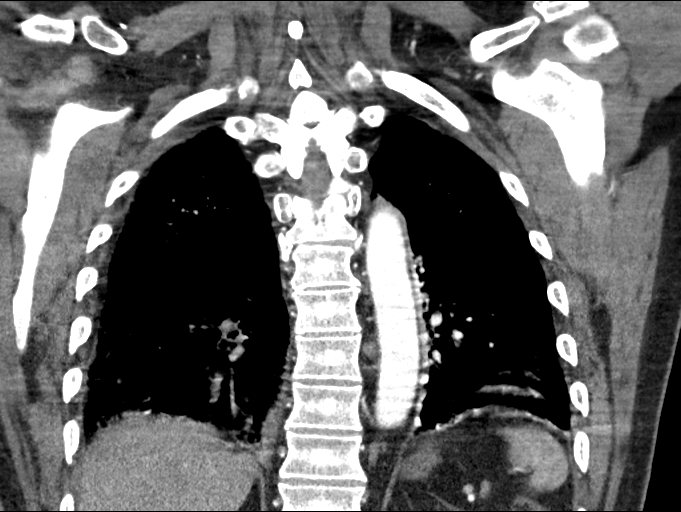

[17 of 46 positions shown; findings below may reference images not displayed]

FINDINGS: Cardiovascular: There are multiple pulmonary emboli throughout the
right lower lobe pulmonary arterial system. No pulmonary emboli are
noted to the left of midline or in either main pulmonary artery. The
right ventricle to left ventricle diameter ratio is less than 0.9,
not consistent with right heart strain.

There is no appreciable thoracic aortic aneurysm or dissection.
Visualized great vessels appear unremarkable. There is aortic
atherosclerosis. There are occasional foci coronary artery
calcification. There is no pericardial effusion or pericardial
thickening evident.

Mediastinum/Nodes: Thyroid appears normal. There is no appreciable
adenopathy. There are scattered subcentimeter lymph nodes which do
not meet size criteria for pathologic significance. No esophageal
lesions are evident.

Lungs/Pleura: There is patchy airspace consolidation in portions of
the lateral and posterior segments of the right lower lobe. There is
a small right pleural effusion. No pulmonary infarct is
demonstrable. There is bibasilar atelectasis as well.

Upper Abdomen: Visualized upper abdominal structures appear normal.

Musculoskeletal: There is degenerative change in thoracic spine.
There are no blastic or lytic bone lesions. There is a degree of
gynecomastia bilaterally.

Review of the MIP images confirms the above findings.
IMPRESSION: 1. Multiple right lower lobe pulmonary artery branch emboli. No more
central pulmonary embolus. No pulmonary embolus to the left of
midline. No right heart strain evident.

2. Small left pleural effusion with areas of airspace consolidation
in portions of the lateral and posterior segments of the right lower
lobe, likely pneumonia. No wedge-shaped defect to suggest pulmonary
infarct appreciable. There is bibasilar atelectasis.

3. Aortic atherosclerosis. Foci coronary artery calcification noted.
No thoracic aortic aneurysm or dissection.

4.  No demonstrable thoracic adenopathy.

5.  Mild gynecomastia bilaterally.

Aortic Atherosclerosis ([id number]).

Critical Value/emergent results were called by telephone at the time
of interpretation on 12/18/2017 at [DATE] to Dr. TAAJ GONG , who
verbally acknowledged these results.

## 2019-01-16 IMAGING — CR DG CHEST 2V
2 series · 2 of 2 positions shown · non-contrast
Comparison: Chest radiograph performed 11/13/2017

CLINICAL DATA: Acute onset of shortness of breath.

EXAM:
CHEST - 2 VIEW

[w chest lat]
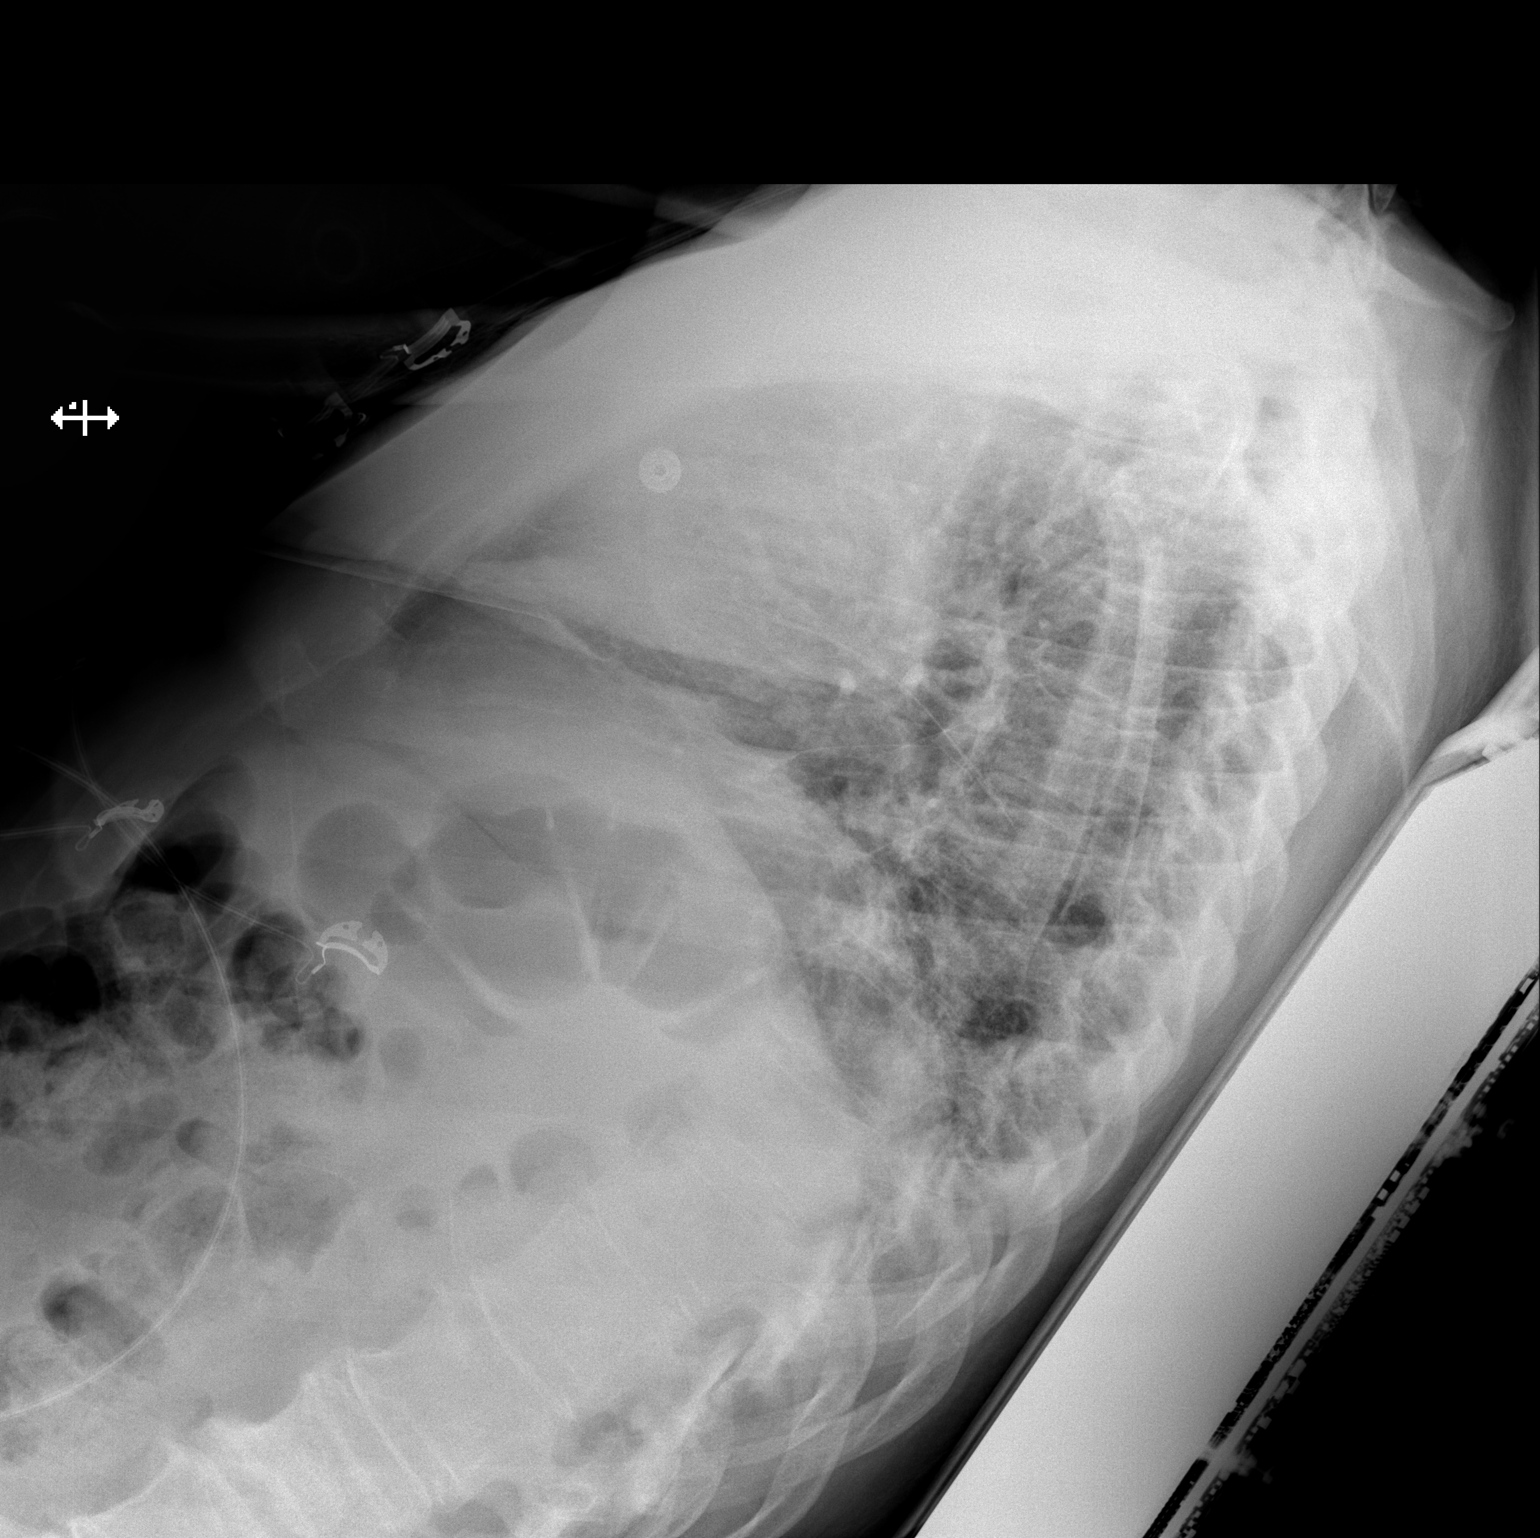

[x chest ap]
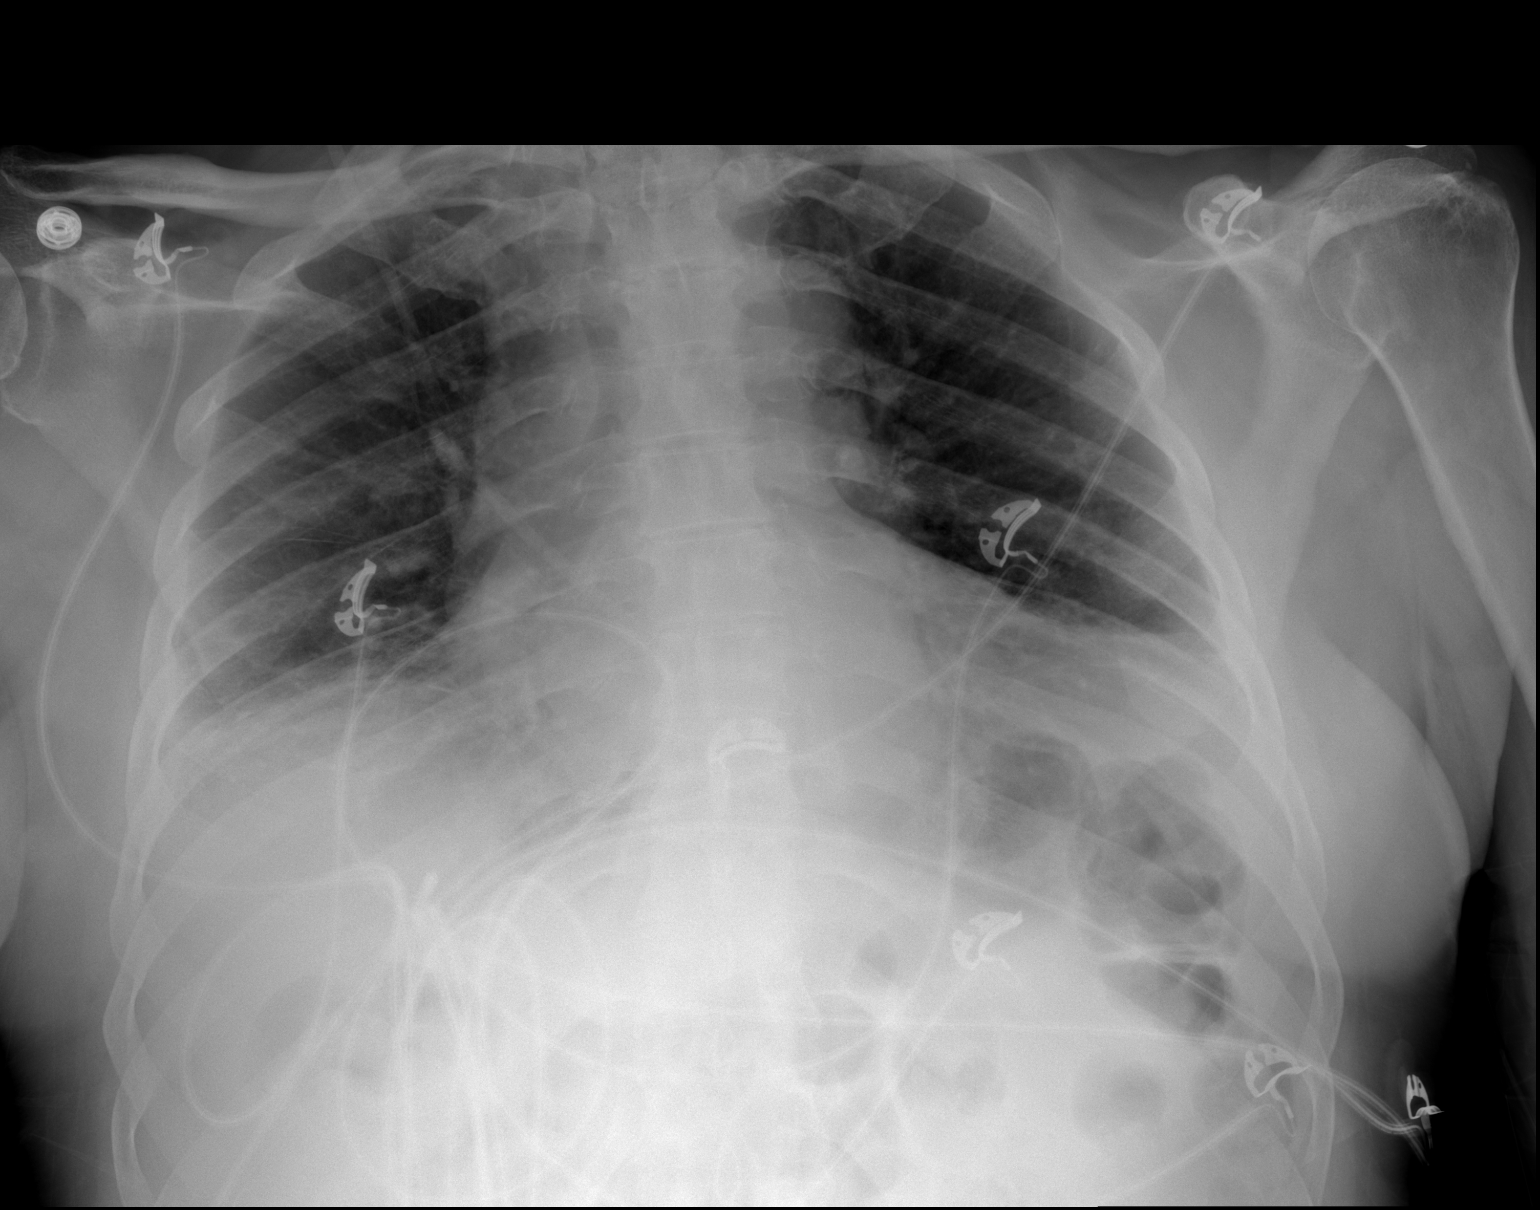

[2 of 2 positions shown; findings below may reference images not displayed]

FINDINGS: The lungs are hypoexpanded. Mild bibasilar atelectasis is noted. No
definite pleural effusion or pneumothorax is seen.

The heart is mildly enlarged. No acute osseous abnormalities are
seen.
IMPRESSION: Lungs hypoexpanded, with mild bibasilar atelectasis. Mild
cardiomegaly.

## 2019-01-19 ENCOUNTER — Telehealth: Payer: Self-pay | Admitting: Internal Medicine

## 2019-01-19 ENCOUNTER — Other Ambulatory Visit: Payer: Self-pay

## 2019-01-19 ENCOUNTER — Encounter: Payer: Self-pay | Admitting: Internal Medicine

## 2019-01-19 ENCOUNTER — Inpatient Hospital Stay: Payer: Medicare Other | Attending: Internal Medicine | Admitting: Internal Medicine

## 2019-01-19 ENCOUNTER — Inpatient Hospital Stay: Payer: Medicare Other

## 2019-01-19 VITALS — BP 145/77 | HR 69 | Temp 97.8°F | Resp 16 | Wt 209.3 lb

## 2019-01-19 DIAGNOSIS — R5383 Other fatigue: Secondary | ICD-10-CM | POA: Insufficient documentation

## 2019-01-19 DIAGNOSIS — C711 Malignant neoplasm of frontal lobe: Secondary | ICD-10-CM | POA: Insufficient documentation

## 2019-01-19 NOTE — Telephone Encounter (Signed)
Scheduled appt per 7/6 los. Printed and mailed calendar. °

## 2019-01-19 NOTE — Progress Notes (Signed)
[facility] [place], [place] [address] [phone]   Interval Evaluation  Date of Service: 01/19/19 Patient Name: Christopher Morris Patient MRN: [id number] Patient DOB: 07/15/1941 Provider: Ventura Sellers, MD  Identifying Statement:  [name] is a 78 y.o. male with left frontal glioblastoma    Oncologic History: Oncology History  Frontal glioblastoma multiforme ([place])  09/30/2017 Surgery   Craniotomy, resection by Dr. Sherwood Gambler.  Path demonstrates glioblastoma   10/28/2017 -  Radiation Therapy   IMRT with concurrent Temodar   01/07/2018 -  Chemotherapy   The patient had [No matching medication found in this treatment plan]  for chemotherapy treatment.      Biomarkers:  MGMT Methylated.  IDH 1/2 Unknown.  EGFR Amplified  EGFRvIII positive   Interval History:  Christopher Morris presents today for follow up after recent hospitalization.  He and his daughter describe return to baseline after admission for breakthrough seizures.  He also decribes stable fatigue.  Continues to walk with his walker, but also ambulates independently at home. Otherwise denies additional seizures, headaches.    Medications: Current Outpatient Medications on File Prior to Visit  Medication Sig Dispense Refill   apixaban (ELIQUIS) 5 MG TABS tablet Take 10 mg BID till 12/24/2017, start taking 5 mg BID from 12/25/2017 (Patient taking differently: Take 5 mg by mouth 2 (two) times daily. ) 60 tablet 0   levETIRAcetam (KEPPRA) 250 MG tablet Take 5 tablets (1,250 mg total) by mouth 2 (two) times daily. 300 tablet 2   vitamin B-12 (CYANOCOBALAMIN) 500 MCG tablet Take 500 mcg by mouth daily.     No current facility-administered medications on file prior to visit.     Allergies:  Allergies  Allergen Reactions   Pork-Derived Products     Patient does not eat pork   Shellfish Allergy     Patient does not eat shellfish   Past Medical History:   Past Medical History:  Diagnosis Date   Brain cancer ([place])    Glioblastoma ([place]) 09/25/2017   Hyperglycemia 09/25/2017   Seizure ([place]) 09/25/2017   Past Surgical History:  Past Surgical History:  Procedure Laterality Date   APPLICATION OF CRANIAL NAVIGATION N/A 09/30/2017   Procedure: APPLICATION OF CRANIAL NAVIGATION;  Surgeon: [name], MD;  Location: [facility];  Service: Neurosurgery;  Laterality: N/A;   CRANIOTOMY N/A 09/30/2017   Procedure: CRANIOTOMY FOR RESECTION OF BRAIN TUMOR WITH STEREOTACTIC;  Surgeon: [name], MD;  Location: [facility];  Service: Neurosurgery;  Laterality: N/A;  CRANIOTOMY FOR RESECTION OF BRAIN TUMOR WITH STEREOTACTIC   Social History:  Social History   Socioeconomic History   Marital status: Divorced    Spouse name: Not on file   Number of children: Not on file   Years of education: Not on file   Highest education level: Not on file  Occupational History   Not on file  Social Needs   Financial resource strain: Not on file   Food insecurity    Worry: Not on file    Inability: Not on file   Transportation needs    Medical: No    Non-medical: No  Tobacco Use   Smoking status: Never Smoker   Smokeless tobacco: Never Used  Substance and Sexual Activity   Alcohol use: No    Frequency: Never   Drug use: No   Sexual activity: Not Currently  Lifestyle   Physical activity    Days per week: 5 days  Minutes per session: Not on file   Stress: Only a little  Relationships   Press photographer on phone: Not on file    Gets together: Not on file    Attends religious service: Not on file    Active member of club or organization: Not on file    Attends meetings of clubs or organizations: Not on file    Relationship status: Not on file   Intimate partner violence    Fear of current or ex partner: Not on file    Emotionally abused: Not on file    Physically abused: Not on file    Forced sexual activity: Not  on file  Other Topics Concern   Not on file  Social History Narrative   Not on file   Family History:  Family History  Problem Relation Age of Onset   Hypertension Daughter    Stroke Mother    Cancer Neg Hx     Review of Systems: Constitutional: Denies fevers, chills or abnormal weight loss Eyes: Denies blurriness of vision Ears, nose, mouth, throat, and face: Denies mucositis or sore throat Respiratory: Denies cough, dyspnea or wheezes Cardiovascular: Denies palpitation, chest discomfort or lower extremity swelling Gastrointestinal:  Denies nausea, constipation, diarrhea GU: Denies dysuria or incontinence Skin: Denies abnormal skin rashes Neurological: Per HPI Musculoskeletal: Denies joint pain, back or neck discomfort. No decrease in ROM Behavioral/Psych: Denies anxiety, disturbance in thought content, and mood instability  Physical Exam: Vitals:   01/19/19 0930  BP: (!) 145/77  Pulse: 69  Resp: 16  Temp: 97.8 F (36.6 C)  SpO2: 100%   KPS: 80. General: Alert, cooperative, pleasant, in no acute distress Head: Craniotomy scar noted, dry and intact. EENT: No conjunctival injection or scleral icterus. Oral mucosa moist Lungs: Resp effort normal Cardiac: Regular rate and rhythm Abdomen: Soft, non-distended abdomen Skin: No rashes cyanosis or petechiae. Extremities: No clubbing or edema  Neurologic Exam: Mental Status: Awake, alert, attentive to examiner. Oriented to self and environment. Language has mild impairment in fluency with intact comprehension and repetition.  Cranial Nerves: Visual acuity is grossly normal. Visual fields are full. Extra-ocular movements intact. No ptosis. Face is symmetric, tongue midline. Motor: Tone and bulk are normal. Pronator drift right arm, left side 5/5 power. Reflexes are symmetric, no pathologic reflexes present. Intact finger to nose bilaterally Sensory: Intact to light touch and temperature Gait: Independent but purposeful  and wide based   Labs: I have reviewed the data as listed    Component Value Date/Time   NA 138 12/30/2018 0452   NA 136 (A) 10/18/2017   K 3.4 (L) 12/30/2018 0452   CL 104 12/30/2018 0452   CO2 25 12/30/2018 0452   GLUCOSE 112 (H) 12/30/2018 0452   BUN 6 (L) 12/30/2018 0452   BUN 19 10/18/2017   CREATININE 0.86 12/30/2018 0452   CREATININE 0.95 10/08/2018 1137   CALCIUM 8.9 12/30/2018 0452   PROT 7.1 12/29/2018 0335   ALBUMIN 3.5 12/30/2018 0452   AST 20 12/29/2018 0335   AST 13 (L) 10/08/2018 1137   ALT 12 12/29/2018 0335   ALT 11 10/08/2018 1137   ALKPHOS 81 12/29/2018 0335   BILITOT 0.9 12/29/2018 0335   BILITOT 1.2 10/08/2018 1137   GFRNONAA >60 12/30/2018 0452   GFRNONAA >60 10/08/2018 1137   GFRAA >60 12/30/2018 0452   GFRAA >60 10/08/2018 1137   Lab Results  Component Value Date   WBC 5.1 12/30/2018   NEUTROABS  2.8 12/30/2018   HGB 12.7 (L) 12/30/2018   HCT 37.9 (L) 12/30/2018   MCV 87.9 12/30/2018   PLT 163 12/30/2018   Imaging:  [facility] Clinician Interpretation: I have personally reviewed the CNS images as listed.  My interpretation, in the context of the patient's clinical presentation, is stable disease  Ct Code Stroke Cta Head W/wo Contrast  Result Date: 12/29/2018 CLINICAL DATA:  Right facial droop.  History of glioblastoma. EXAM: CT ANGIOGRAPHY HEAD AND NECK CT PERFUSION BRAIN TECHNIQUE: Multidetector CT imaging of the head and neck was performed using the standard protocol during bolus administration of intravenous contrast. Multiplanar CT image reconstructions and MIPs were obtained to evaluate the vascular anatomy. Carotid stenosis measurements (when applicable) are obtained utilizing NASCET criteria, using the distal internal carotid diameter as the denominator. Multiphase CT imaging of the brain was performed following IV bolus contrast injection. Subsequent parametric perfusion maps were calculated using RAPID software. CONTRAST:  140m OMNIPAQUE  IOHEXOL 350 MG/ML SOLN COMPARISON:  Head CT 12/29/2018 and MRI 11/17/2018. FINDINGS: CTA NECK FINDINGS Aortic arch: Standard 3 vessel aortic arch with mild atherosclerotic plaque. Widely patent arch vessel origins. Right carotid system: Patent without evidence of stenosis or dissection. Minimal atherosclerotic plaque in the carotid bulb. Retropharyngeal course of the distal common and proximal internal carotid arteries. Left carotid system: Patent without evidence of stenosis or dissection. Partially retropharyngeal course of the distal common and proximal external carotid arteries. Vertebral arteries: Patent and codominant without evidence of stenosis or dissection. Skeleton: Advanced diffuse cervical disc degeneration. Other neck: No evidence of cervical lymphadenopathy or mass. Upper chest: Clear lung apices. Review of the MIP images confirms the above findings CTA HEAD FINDINGS Anterior circulation: The internal carotid arteries are patent from skull base to carotid termini with nonstenotic plaque more notable on the left. ACAs and MCAs are patent with mild branch vessel irregularity but no evidence of proximal branch occlusion or significant proximal stenosis. No aneurysm is identified. Posterior circulation: The intracranial vertebral arteries are patent to the basilar without significant stenosis. Patent right PICA, left AICA, and bilateral SCA origins are identified. The basilar artery is widely patent. Posterior communicating arteries are diminutive or absent. PCAs are patent with moderate P2 stenoses bilaterally. No aneurysm is identified. Venous sinuses: As permitted by contrast timing, patent. Anatomic variants: None. Review of the MIP images confirms the above findings CT Brain Perfusion Findings: ASPECTS: 10 CBF (<30%) Volume: 482mPerfusion (Tmax>6.0s) volume: 5945mismatch Volume: 59m13mfarction Location:Left cerebral hemisphere primarily involving frontal and parietal lobes in the MCA territory as  well as possibly some ACA involvement. This perfusion abnormality is predominantly within subcortical white matter with scattered areas of cortical involvement and partially corresponds to the patient's known glioblastoma and white matter changes on MRI. IMPRESSION: 1. No emergent large vessel occlusion. 2. Moderate bilateral P2 stenoses. 3. No significant proximal stenosis in the anterior intracranial circulation. 4. Widely patent cervical carotid and vertebral arteries. 5. Altered perfusion in the left cerebral hemisphere particularly involving frontoparietal subcortical white matter. While this may reflect an acute infarct with small amount of penumbra, the patient's known underlying glioblastoma and edema/post treatment changes may also be contributing to the altered perfusion. 6.  Aortic Atherosclerosis (ICD10-I70.0). These results were called by telephone at the time of interpretation on 12/29/2018 at 4:20 am to Dr. ERICKerney Elbeho verbally acknowledged these results. Electronically Signed   By: AlleLogan Bores.   On: 12/29/2018 04:29   Ct Code Stroke Cta Neck  W/wo Contrast  Result Date: 12/29/2018 CLINICAL DATA:  Right facial droop.  History of glioblastoma. EXAM: CT ANGIOGRAPHY HEAD AND NECK CT PERFUSION BRAIN TECHNIQUE: Multidetector CT imaging of the head and neck was performed using the standard protocol during bolus administration of intravenous contrast. Multiplanar CT image reconstructions and MIPs were obtained to evaluate the vascular anatomy. Carotid stenosis measurements (when applicable) are obtained utilizing NASCET criteria, using the distal internal carotid diameter as the denominator. Multiphase CT imaging of the brain was performed following IV bolus contrast injection. Subsequent parametric perfusion maps were calculated using RAPID software. CONTRAST:  115m OMNIPAQUE IOHEXOL 350 MG/ML SOLN COMPARISON:  Head CT 12/29/2018 and MRI 11/17/2018. FINDINGS: CTA NECK FINDINGS Aortic arch:  Standard 3 vessel aortic arch with mild atherosclerotic plaque. Widely patent arch vessel origins. Right carotid system: Patent without evidence of stenosis or dissection. Minimal atherosclerotic plaque in the carotid bulb. Retropharyngeal course of the distal common and proximal internal carotid arteries. Left carotid system: Patent without evidence of stenosis or dissection. Partially retropharyngeal course of the distal common and proximal external carotid arteries. Vertebral arteries: Patent and codominant without evidence of stenosis or dissection. Skeleton: Advanced diffuse cervical disc degeneration. Other neck: No evidence of cervical lymphadenopathy or mass. Upper chest: Clear lung apices. Review of the MIP images confirms the above findings CTA HEAD FINDINGS Anterior circulation: The internal carotid arteries are patent from skull base to carotid termini with nonstenotic plaque more notable on the left. ACAs and MCAs are patent with mild branch vessel irregularity but no evidence of proximal branch occlusion or significant proximal stenosis. No aneurysm is identified. Posterior circulation: The intracranial vertebral arteries are patent to the basilar without significant stenosis. Patent right PICA, left AICA, and bilateral SCA origins are identified. The basilar artery is widely patent. Posterior communicating arteries are diminutive or absent. PCAs are patent with moderate P2 stenoses bilaterally. No aneurysm is identified. Venous sinuses: As permitted by contrast timing, patent. Anatomic variants: None. Review of the MIP images confirms the above findings CT Brain Perfusion Findings: ASPECTS: 10 CBF (<30%) Volume: 458mPerfusion (Tmax>6.0s) volume: 596mismatch Volume: 48m44mfarction Location:Left cerebral hemisphere primarily involving frontal and parietal lobes in the MCA territory as well as possibly some ACA involvement. This perfusion abnormality is predominantly within subcortical white matter  with scattered areas of cortical involvement and partially corresponds to the patient's known glioblastoma and white matter changes on MRI. IMPRESSION: 1. No emergent large vessel occlusion. 2. Moderate bilateral P2 stenoses. 3. No significant proximal stenosis in the anterior intracranial circulation. 4. Widely patent cervical carotid and vertebral arteries. 5. Altered perfusion in the left cerebral hemisphere particularly involving frontoparietal subcortical white matter. While this may reflect an acute infarct with small amount of penumbra, the patient's known underlying glioblastoma and edema/post treatment changes may also be contributing to the altered perfusion. 6.  Aortic Atherosclerosis (ICD10-I70.0). These results were called by telephone at the time of interpretation on 12/29/2018 at 4:20 am to Dr. ERICKerney Elbeho verbally acknowledged these results. Electronically Signed   By: AlleLogan Bores.   On: 12/29/2018 04:29   Mr BraiJeri CosCYTtrast  Result Date: 12/29/2018 CLINICAL DATA:  Fall with altered mental status, aphasia, and right upper extremity posturing. History of glioblastoma. EXAM: MRI HEAD WITHOUT AND WITH CONTRAST TECHNIQUE: Multiplanar, multiecho pulse sequences of the brain and surrounding structures were obtained without and with intravenous contrast. CONTRAST:  9 mL Gadavist COMPARISON:  Head CT, CTA, and CTP 12/29/2018.  Head MRI 11/17/2018. FINDINGS: Brain: There is no acute infarct, midline shift, or extra-axial fluid collection. There is moderate cerebral atrophy. Sequelae of left frontal craniotomy and tumor resection are again identified with chronic blood products at the left frontal resection site. Extensive nonenhancing T2 hyperintensity involving the left greater than right cerebral white matter is unchanged with mild left-sided sulcal effacement and mild mass effect on the left lateral ventricle again noted. Irregular enhancement at the left frontal resection site extending  to the superior margin of the left lateral ventricle has not significantly changed in extent, with a maximal diameter of 3.4 cm on axial images. A small focus of restricted diffusion along the posterior aspect of the enhancement is stable to slightly decreased. No abnormal enhancement is identified remote from the resection site. Vascular: Major intracranial vascular flow voids are preserved. Skull and upper cervical spine: Right frontal craniotomy. No suspicious marrow lesion. Advanced upper cervical disc and facet degeneration. Sinuses/Orbits: Unremarkable orbits. Paranasal sinuses and mastoid air cells are clear. Other: Unchanged 7 mm cystic focus in the left parotid gland. IMPRESSION: 1. No acute infarct or other acute intracranial abnormality. 2. Unchanged irregularly enhancing lesion in the left frontal lobe with unchanged extensive white matter T2 signal abnormality which could reflect a combination of edema and nonenhancing tumor. Electronically Signed   By: Logan Bores M.D.   On: 12/29/2018 07:26   Ct Code Stroke Cta Cerebral Perfusion W/wo Contrast  Result Date: 12/29/2018 CLINICAL DATA:  Right facial droop.  History of glioblastoma. EXAM: CT ANGIOGRAPHY HEAD AND NECK CT PERFUSION BRAIN TECHNIQUE: Multidetector CT imaging of the head and neck was performed using the standard protocol during bolus administration of intravenous contrast. Multiplanar CT image reconstructions and MIPs were obtained to evaluate the vascular anatomy. Carotid stenosis measurements (when applicable) are obtained utilizing NASCET criteria, using the distal internal carotid diameter as the denominator. Multiphase CT imaging of the brain was performed following IV bolus contrast injection. Subsequent parametric perfusion maps were calculated using RAPID software. CONTRAST:  167m OMNIPAQUE IOHEXOL 350 MG/ML SOLN COMPARISON:  Head CT 12/29/2018 and MRI 11/17/2018. FINDINGS: CTA NECK FINDINGS Aortic arch: Standard 3 vessel aortic  arch with mild atherosclerotic plaque. Widely patent arch vessel origins. Right carotid system: Patent without evidence of stenosis or dissection. Minimal atherosclerotic plaque in the carotid bulb. Retropharyngeal course of the distal common and proximal internal carotid arteries. Left carotid system: Patent without evidence of stenosis or dissection. Partially retropharyngeal course of the distal common and proximal external carotid arteries. Vertebral arteries: Patent and codominant without evidence of stenosis or dissection. Skeleton: Advanced diffuse cervical disc degeneration. Other neck: No evidence of cervical lymphadenopathy or mass. Upper chest: Clear lung apices. Review of the MIP images confirms the above findings CTA HEAD FINDINGS Anterior circulation: The internal carotid arteries are patent from skull base to carotid termini with nonstenotic plaque more notable on the left. ACAs and MCAs are patent with mild branch vessel irregularity but no evidence of proximal branch occlusion or significant proximal stenosis. No aneurysm is identified. Posterior circulation: The intracranial vertebral arteries are patent to the basilar without significant stenosis. Patent right PICA, left AICA, and bilateral SCA origins are identified. The basilar artery is widely patent. Posterior communicating arteries are diminutive or absent. PCAs are patent with moderate P2 stenoses bilaterally. No aneurysm is identified. Venous sinuses: As permitted by contrast timing, patent. Anatomic variants: None. Review of the MIP images confirms the above findings CT Brain Perfusion Findings: ASPECTS: 10 CBF (<30%)  Volume: 73m Perfusion (Tmax>6.0s) volume: 574mMismatch Volume: 1838mnfarction Location:Left cerebral hemisphere primarily involving frontal and parietal lobes in the MCA territory as well as possibly some ACA involvement. This perfusion abnormality is predominantly within subcortical white matter with scattered areas of  cortical involvement and partially corresponds to the patient's known glioblastoma and white matter changes on MRI. IMPRESSION: 1. No emergent large vessel occlusion. 2. Moderate bilateral P2 stenoses. 3. No significant proximal stenosis in the anterior intracranial circulation. 4. Widely patent cervical carotid and vertebral arteries. 5. Altered perfusion in the left cerebral hemisphere particularly involving frontoparietal subcortical white matter. While this may reflect an acute infarct with small amount of penumbra, the patient's known underlying glioblastoma and edema/post treatment changes may also be contributing to the altered perfusion. 6.  Aortic Atherosclerosis (ICD10-I70.0). These results were called by telephone at the time of interpretation on 12/29/2018 at 4:20 am to Dr. ERIKerney Elbewho verbally acknowledged these results. Electronically Signed   By: AllLogan BoresD.   On: 12/29/2018 04:29   Ct Head Code Stroke Wo Contrast  Result Date: 12/29/2018 CLINICAL DATA:  Code stroke. [age] M; altered mental status and right-sided facial droop as well as right arm contracture. Possible stroke. History of glioblastoma post resection. EXAM: CT HEAD WITHOUT CONTRAST TECHNIQUE: Contiguous axial images were obtained from the base of the skull through the vertex without intravenous contrast. COMPARISON:  11/17/2018 MRI of the head.  11/13/2017 CT of the head. FINDINGS: Brain: No acute stroke, hemorrhage, hydrocephalus, extra-axial collection, or herniation. Diffuse hypoattenuation white matter in the left-greater-than-right cerebrum is stable in distribution in comparison with T2 signal abnormality on the prior MRI of the brain given differences in technique. There is stable mass effect in the left anterior frontal lobe corresponding to the region of resected glioblastoma. Suboptimal assessment of the tumor in the absence of contrast. Vascular: No hyperdense vessel or unexpected calcification. Skull: Stable  postsurgical changes related to left frontal craniotomy. Sinuses/Orbits: No acute finding. Other: None. ASPECTS ([facility] Early CT Score) - Ganglionic level infarction (caudate, lentiform nuclei, internal capsule, insula, M1-M3 cortex): 7 - Supraganglionic infarction (M4-M6 cortex): 3 Total score (0-10 with 10 being normal): 10 IMPRESSION: 1. No acute stroke or hemorrhage identified. 2. ASPECTS is 10 3. extensive white matter changes, mass effect, and postsurgical changes of the left frontal lobe related to history of glioblastoma and resection are grossly stable from prior MRI given differences in technique. These results were communicated to Dr. LinCheral Marker 3:48 amon 6/15/2020by text page via the [facility] Campusssaging system. Electronically Signed   By: LanKristine GarbeD.   On: 12/29/2018 03:51    Assessment/Plan 1. Glioblastoma ([place]CC[place]2. Seizure ([facility]Mr. SimDorvil clinically and radiographically stable today.    We will continue to follow him with serial imaging after completing 9 cycles of 5-day TMZ.  He should continue Keppra 1250m40mD, and Eliquis.  We recommend he return in 2 months with an MRI brain for evaluation, or sooner as needed.  All questions were answered. The patient knows to call the clinic with any problems, questions or concerns. No barriers to learning were detected.  The total time spent in the encounter was 25 minutes and more than 50% was on counseling and review of test results   ZachVentura Sellers Medical Director of Neuro-Oncology [facility] [place]06/20 9:26 AM

## 2019-01-20 ENCOUNTER — Other Ambulatory Visit: Payer: Self-pay | Admitting: *Deleted

## 2019-01-20 DIAGNOSIS — C711 Malignant neoplasm of frontal lobe: Secondary | ICD-10-CM

## 2019-01-20 IMAGING — CT CT ANGIO CHEST
3 of 7 series · 17 of 36 positions shown · IV contrast (ISOVUE)
Comparison: CTA of the chest performed 12/18/2017, and chest
radiograph performed earlier today at [DATE] a.m.

CLINICAL DATA: Assess interval change in patient's known
right-sided pulmonary embolus. Shortness of breath.

EXAM:
CT ANGIOGRAPHY CHEST WITH CONTRAST
TECHNIQUE: Multidetector CT imaging of the chest was performed using the
standard protocol during bolus administration of intravenous
contrast. Multiplanar CT image reconstructions and MIPs were
obtained to evaluate the vascular anatomy.
CONTRAST:  100mL MOQ60M-GCV IOPAMIDOL (MOQ60M-GCV) INJECTION 76%

[Series 5: thins · axial · 0.75mm/px · z∈[+1452,+1706]mm · 14 of 294 slices shown]
[im 20/294  lung]
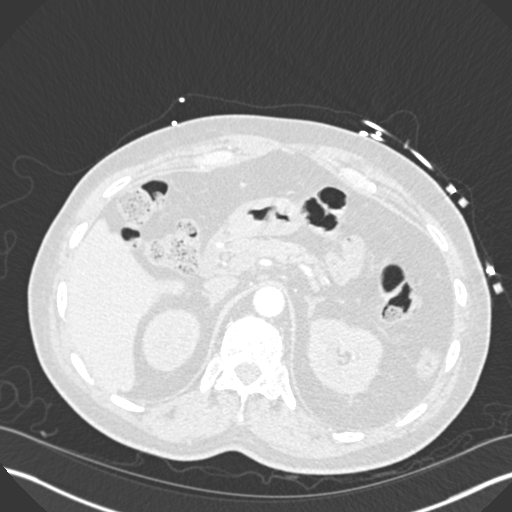
[im 40/294  mediastinal]
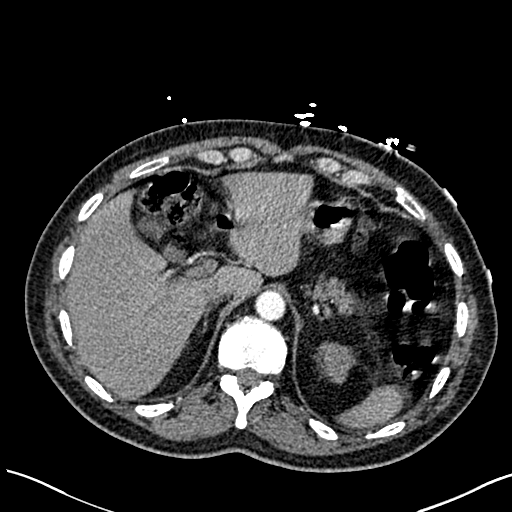
[im 59/294  lung]
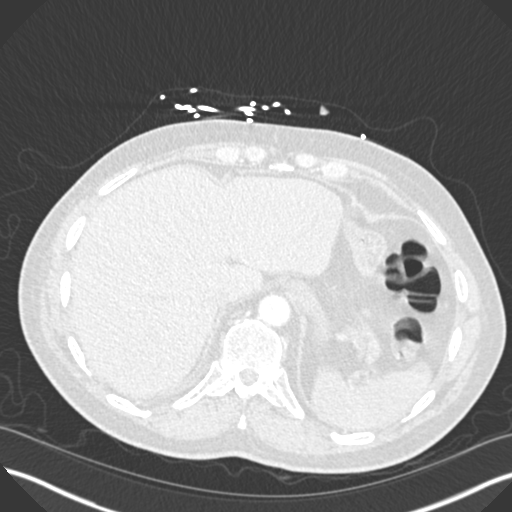
[im 79/294  mediastinal]
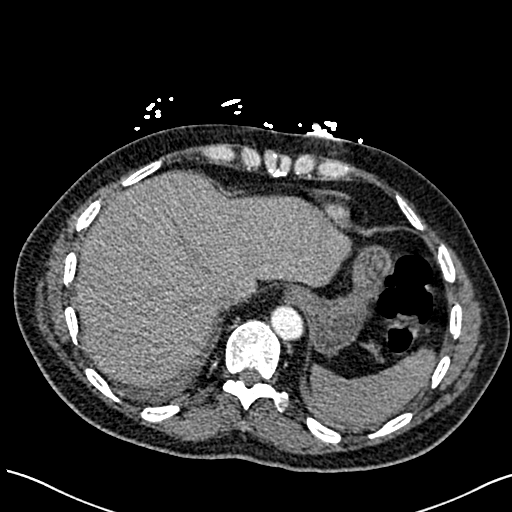
[im 98/294  lung]
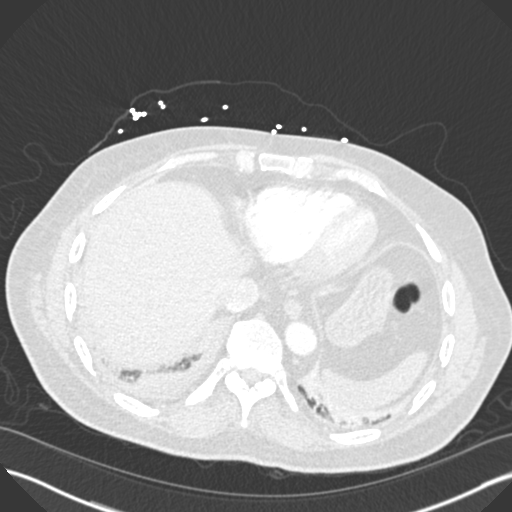
[im 118/294  mediastinal]
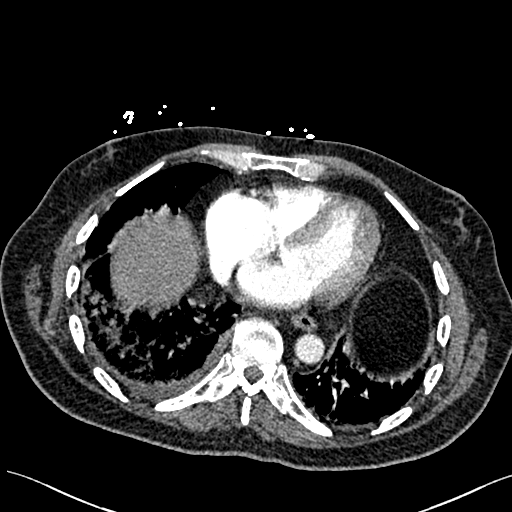
[im 137/294  lung]
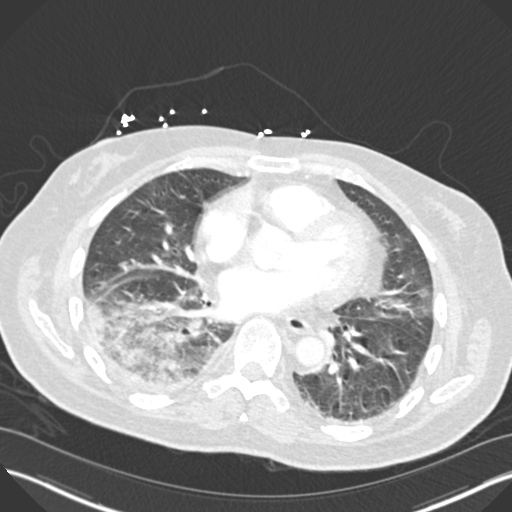
[im 157/294  mediastinal]
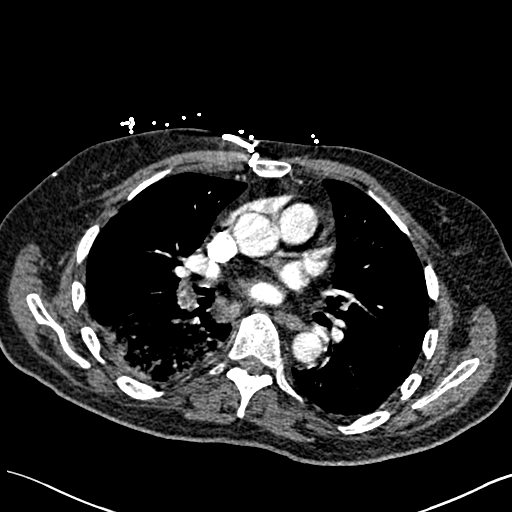
[im 176/294  lung]
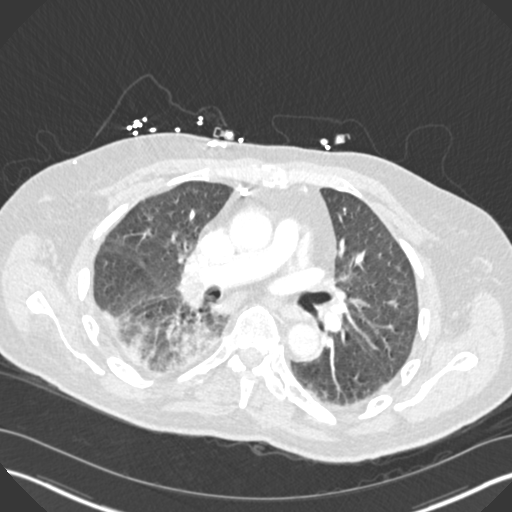
[im 196/294  mediastinal]
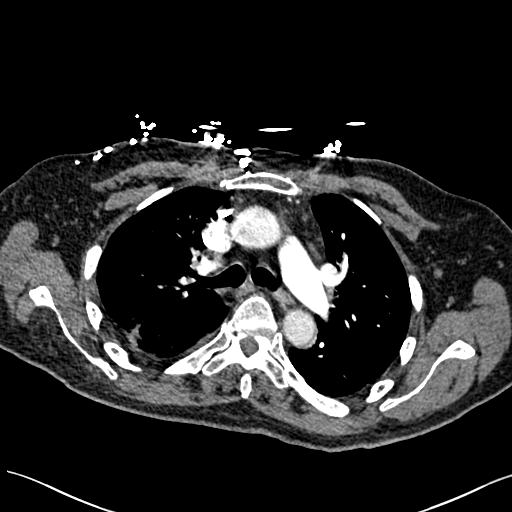
[im 215/294  lung]
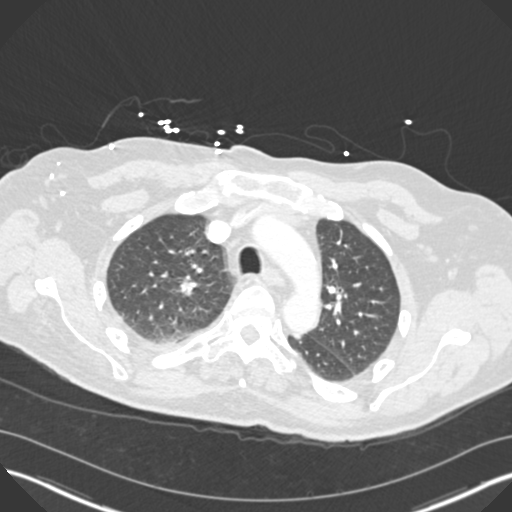
[im 235/294  mediastinal]
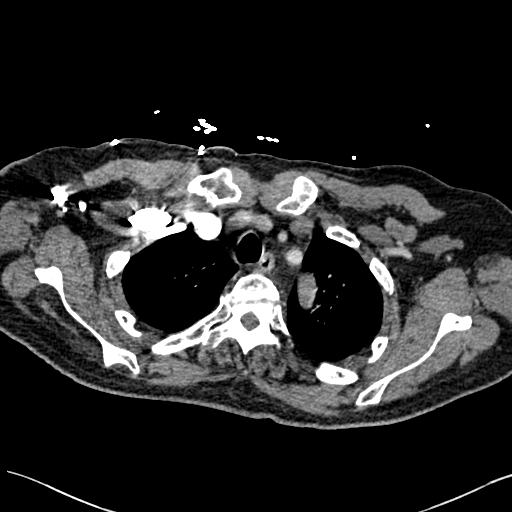
[im 254/294  lung]
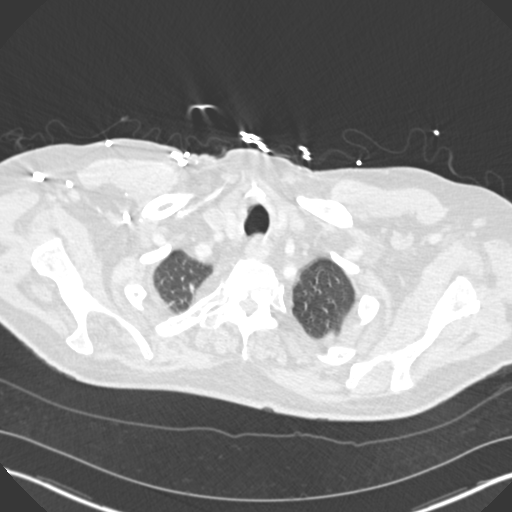
[im 274/294  mediastinal]
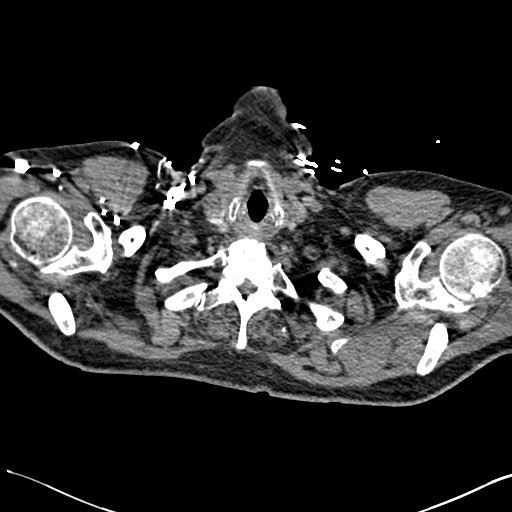

[Series 6: lung · axial · 0.75mm/px · z∈[+1542,+1588]mm · 2 of 115 slices shown]
[im 23/115  mediastinal]
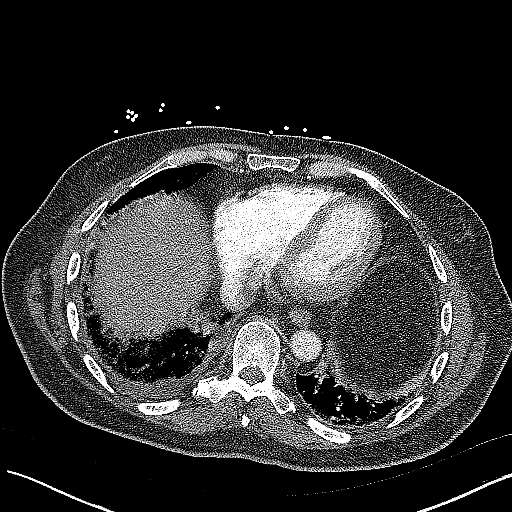
[im 46/115  mediastinal]
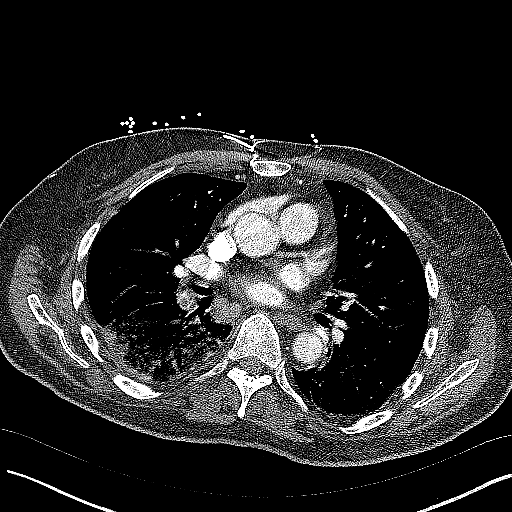

[Series 7: coronal mpr · coronal · 0.56mm/px · 1 of 131 slices shown]
[im 66/131  mediastinal]
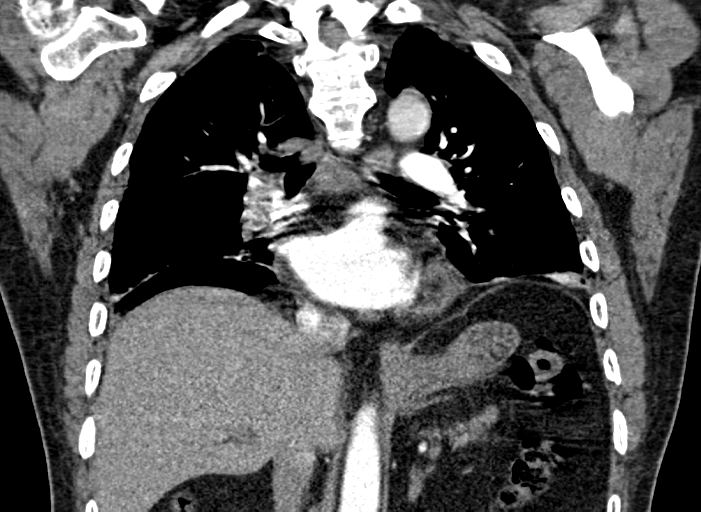

[17 of 36 positions shown; findings below may reference images not displayed]

FINDINGS: Cardiovascular: The patient's right-sided pulmonary embolus is
unchanged from the prior study. However, it is better seen due to
less motion artifact on the current study. There is embolus in the
right main pulmonary artery, extending distally into subsegmental
branches. The RV/LV ratio is now 1.9, corresponding to significant
right heart strain.

The heart is normal in size. Scattered calcification is noted along
the aortic arch. The great vessels are unremarkable in appearance.

Mediastinum/Nodes: Enlarged mediastinal nodes are seen, measuring up
to 1.6 cm in short axis at the azygoesophageal recess. No
pericardial effusion is identified. The thyroid gland is diminutive
and grossly unremarkable. No axillary lymphadenopathy is seen.

Lungs/Pleura: Worsening right lower lobe airspace opacification is
concerning for pneumonia superimposed on pulmonary infarct. A small
right pleural effusion is noted. Minimal left basilar atelectasis is
noted. No pneumothorax is seen. No dominant mass is identified.

Upper Abdomen: The visualized portions of the liver and the spleen
are unremarkable in appearance. The gallbladder is grossly
unremarkable. The visualized portions of the pancreas, adrenal
glands and kidneys are unremarkable. Mild bilateral perinephric
stranding is seen.

Musculoskeletal: No acute osseous abnormalities are identified. Mild
degenerative change is noted at the lower cervical spine. The
visualized musculature is unremarkable in appearance.

Review of the MIP images confirms the above findings.
IMPRESSION: 1. Right-sided pulmonary embolus is unchanged from the recent prior
study. However, it is better seen due to decreased motion artifact
on the prior study. Embolus noted at the right main pulmonary
artery, extending distally into subsegmental branches. Positive for
acute PE with CT evidence of right heart strain (RV/LV Ratio = 1.9)
consistent with at least submassive (intermediate risk) PE. The
presence of right heart strain has been associated with an increased
risk of morbidity and mortality. Please activate Code PE by paging
[phone].
2. Worsening right lower lobe airspace opacification is concerning
for pneumonia superimposed on pulmonary infarct. Small right pleural
effusion noted.
3. Enlarged mediastinal nodes, measuring up to 1.6 cm in short axis
at the azygoesophageal recess. This could reflect underlying
infection.

Critical Value/emergent results were called by telephone at the time
of interpretation on 12/22/2017 at [DATE] to LOUSHE KENZO PA, who
verbally acknowledged these results.

## 2019-01-20 IMAGING — CR DG CHEST 2V
2 series · 2 of 2 positions shown · non-contrast
Comparison: Prior radiograph from 12/18/2017.

CLINICAL DATA: Initial evaluation for acute shortness of breath.

EXAM:
CHEST - 2 VIEW

[w chest lat]
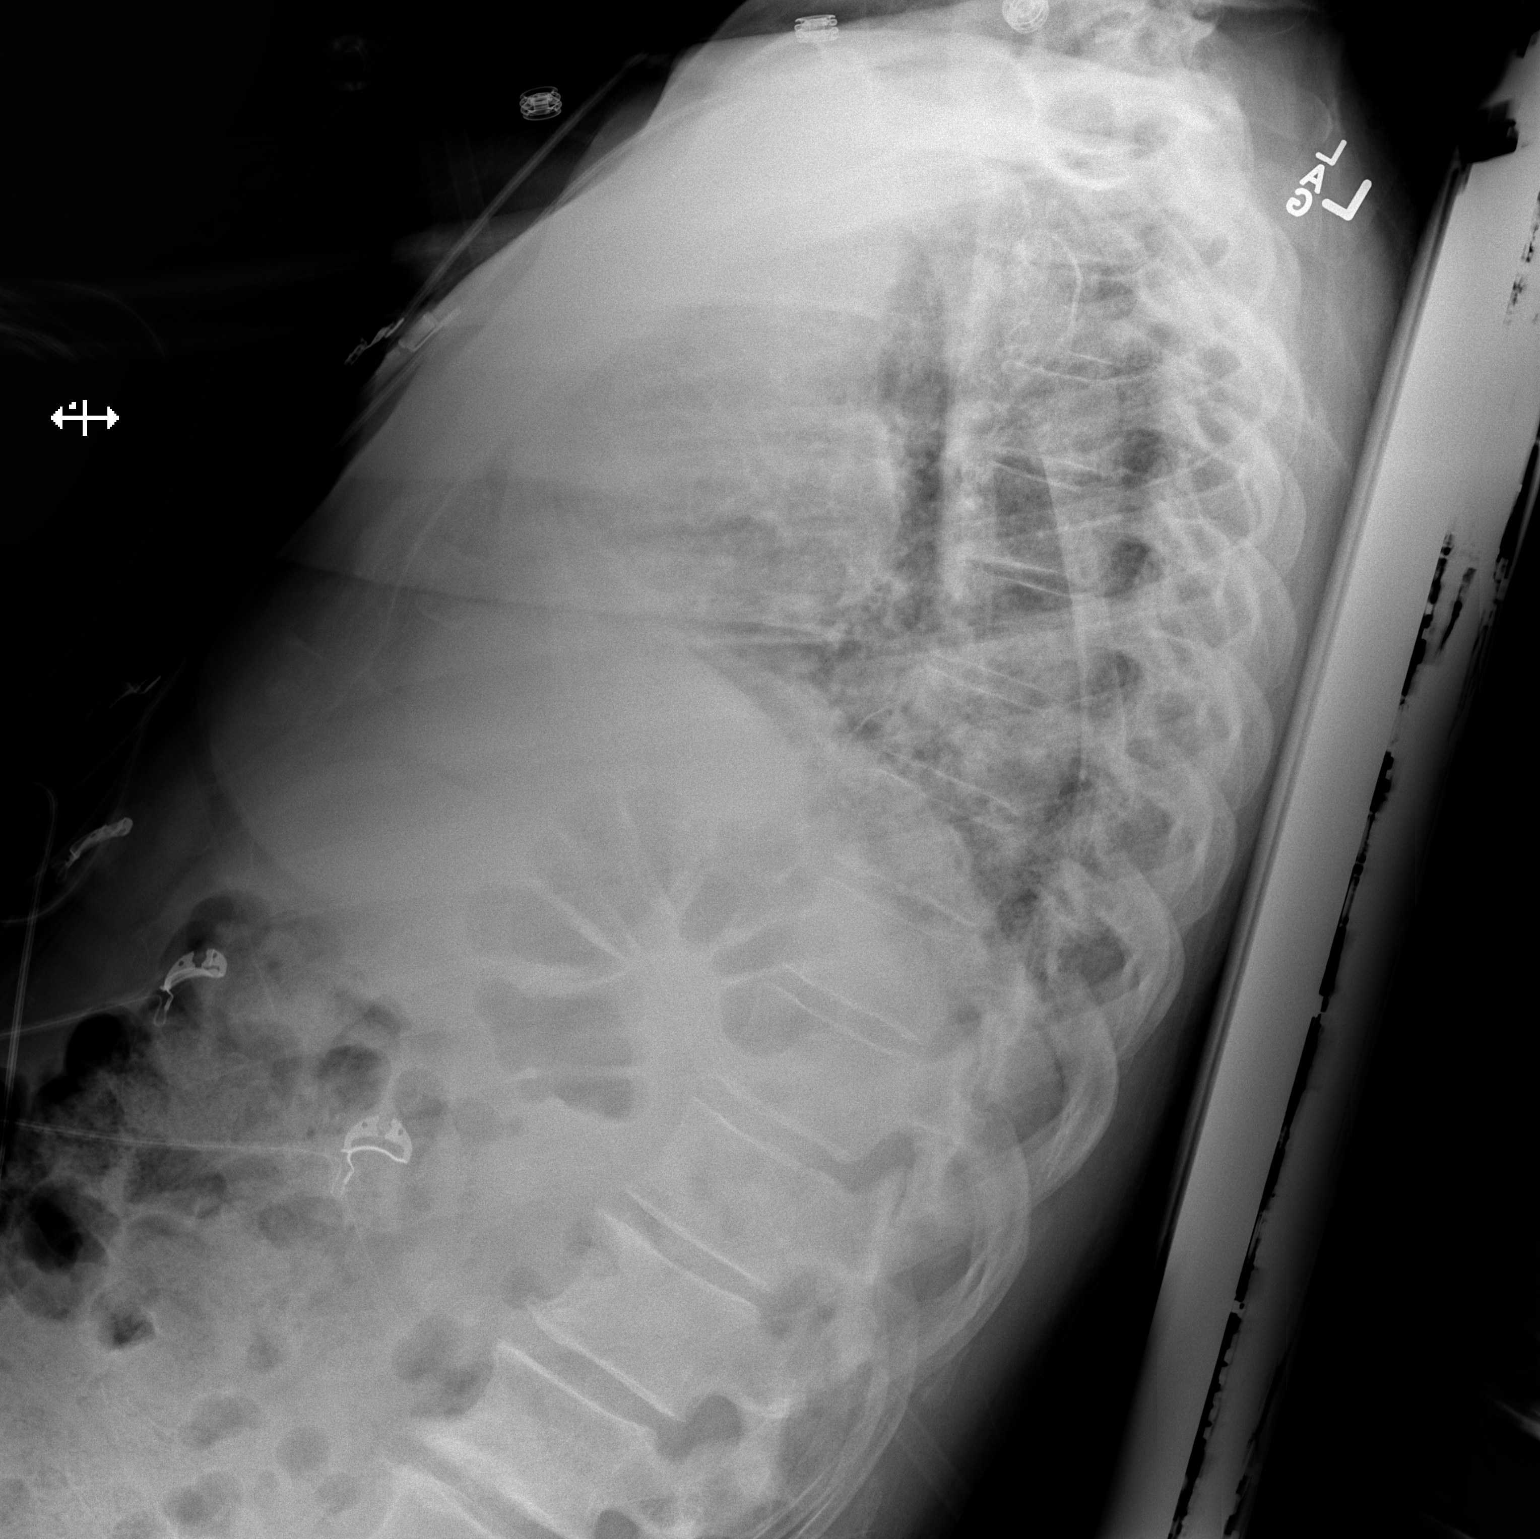

[x chest ap]
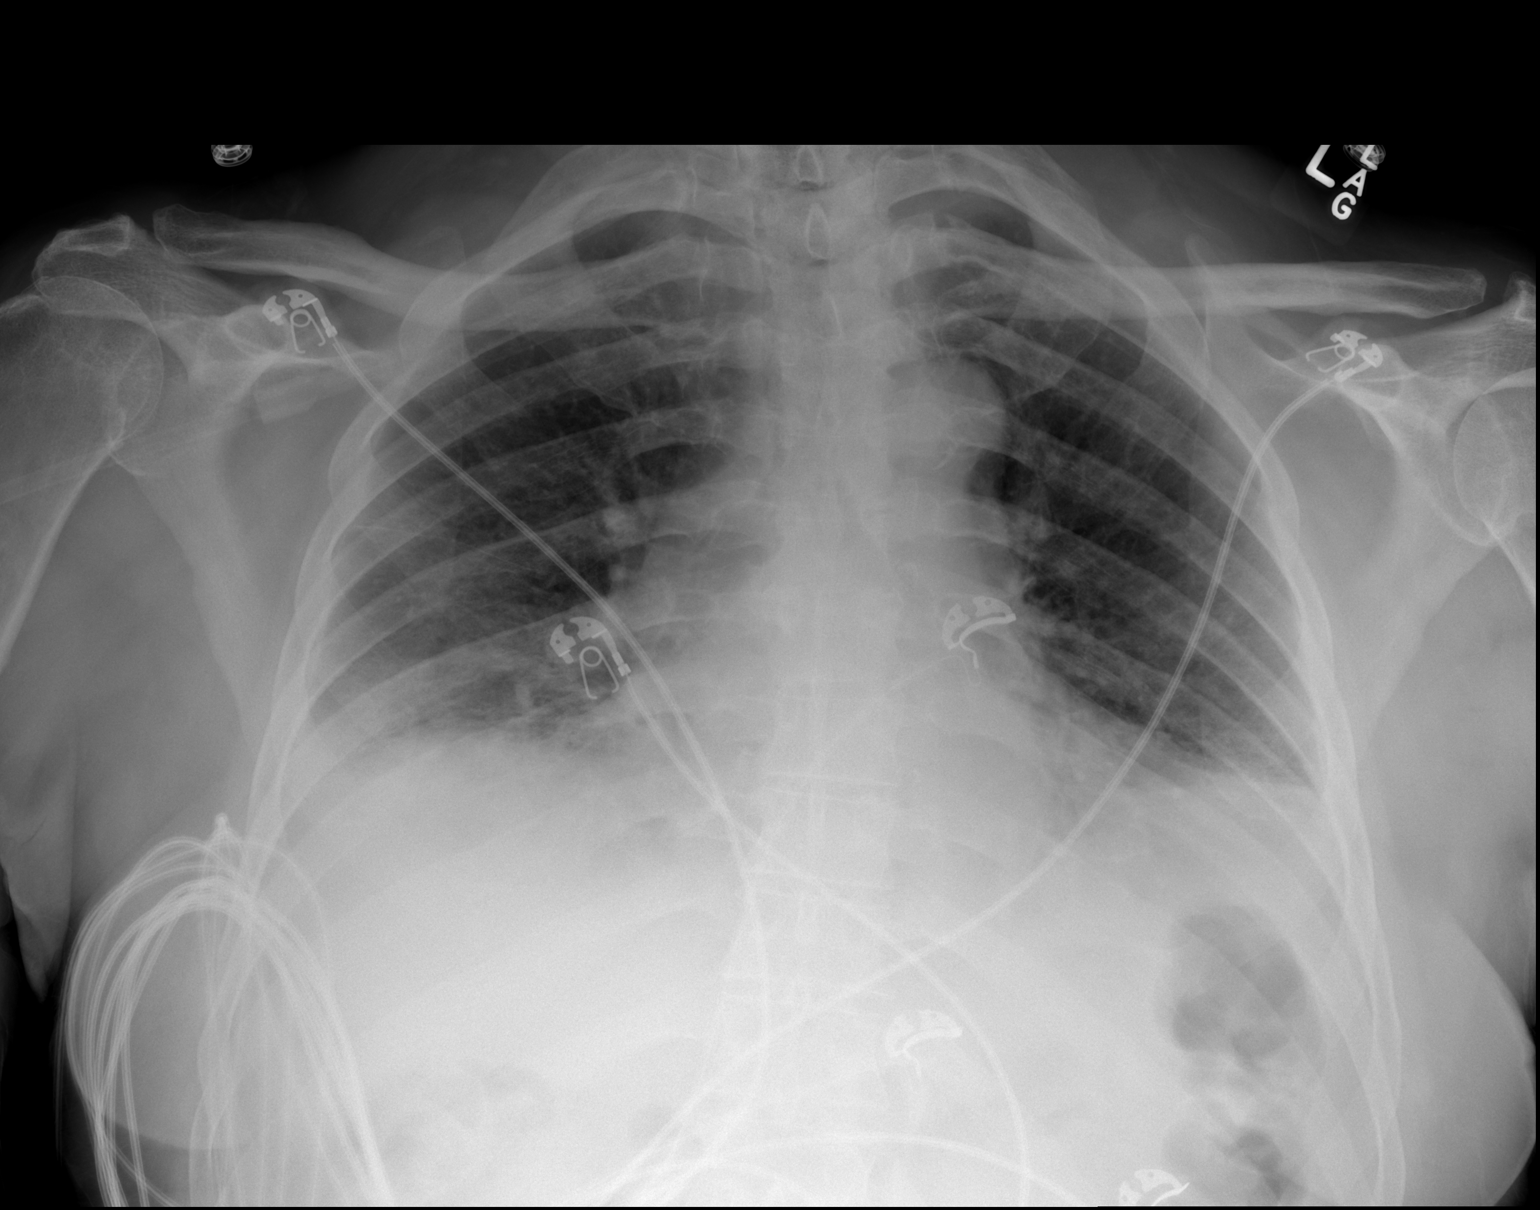

[2 of 2 positions shown; findings below may reference images not displayed]

FINDINGS: Mild cardiomegaly. Mediastinal silhouette within normal limits.
Aortic atherosclerosis.

Lungs are hypoinflated. Small layering bilateral pleural effusions.
Associated bibasilar opacities may reflect atelectasis and/or
infiltrates, right greater than left. Perihilar vascular congestion
without frank pulmonary edema. No pneumothorax.

No acute osseus abnormality.
IMPRESSION: 1. Low lung volumes with small bilateral pleural effusions.
Associated bibasilar opacities may reflect atelectasis and/or
infiltrates, right greater than left.
2. Cardiomegaly with perihilar vascular congestion without frank
pulmonary edema.

## 2019-01-25 IMAGING — MR MR HEAD WO/W CM
10 of 14 series · 33 of 48 positions shown · IV contrast (multihance)
Comparison: 10/02/2017.

CLINICAL DATA: Continued surveillance glioblastoma. Tumor resection
09/30/2016.

EXAM:
MRI HEAD WITHOUT AND WITH CONTRAST
TECHNIQUE: Multiplanar, multiecho pulse sequences of the brain and surrounding
structures were obtained without and with intravenous contrast.
CONTRAST:  18mL MULTIHANCE GADOBENATE DIMEGLUMINE 529 MG/ML IV SOLN

[Series 4: DWI · axial · 3.0mm · 1.09mm/px · z∈[-43,+104]mm · 8 of 100 slices shown (1 of 4)]
[im 1/100]
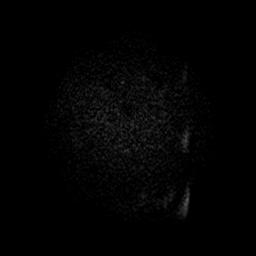
[im 15/100]
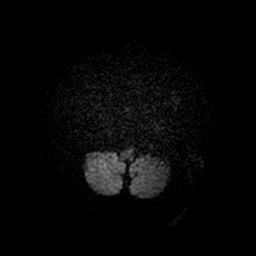
[im 29/100]
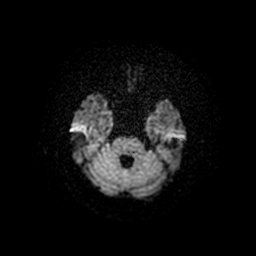
[im 43/100]
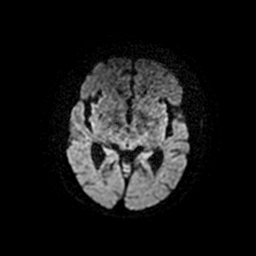
[im 57/100]
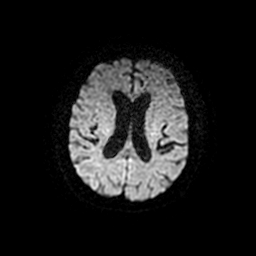
[im 71/100]
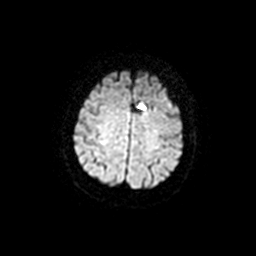
[im 85/100]
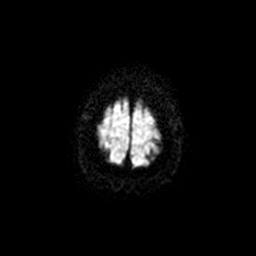
[im 100/100]
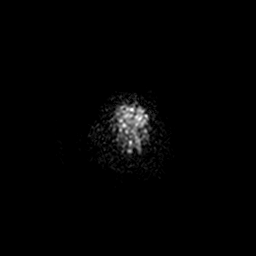

[Series 5: DWI · coronal · 4.0mm · 1.09mm/px · 6 of 70 slices shown (2 of 4)]
[im 1/70]
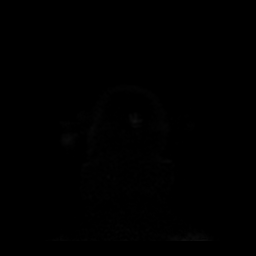
[im 14/70]
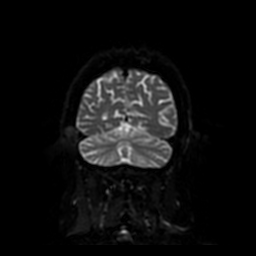
[im 28/70]
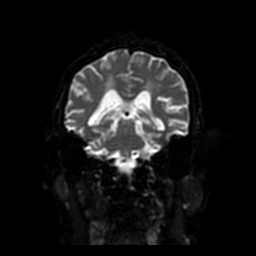
[im 42/70]
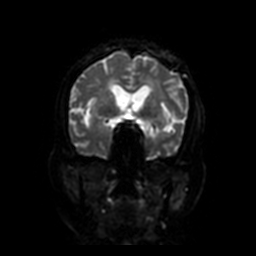
[im 56/70]
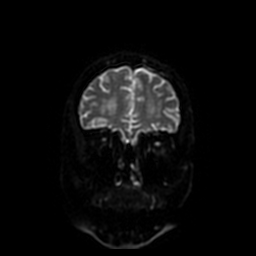
[im 70/70]
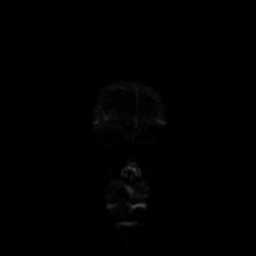

[Series 6: T2 · axial · 5.0mm · 0.43mm/px · z∈[-58,+96]mm · 2 of 23 slices shown (1 of 2)]
[im 1/23]
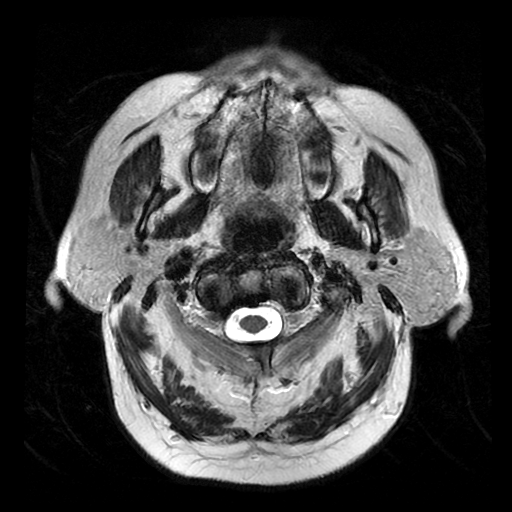
[im 23/23]
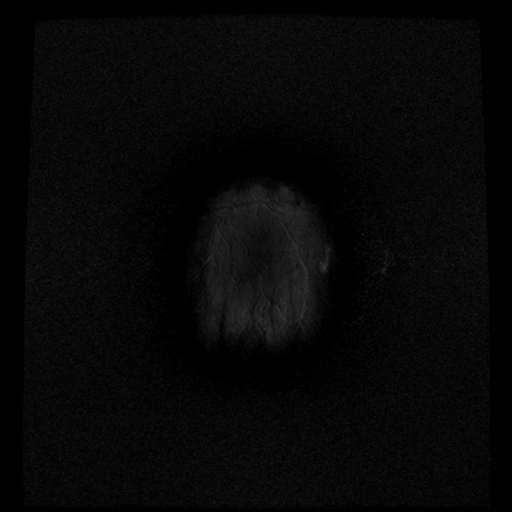

[Series 7: FLAIR · axial · 5.0mm · 0.43mm/px · z∈[-59,+97]mm · 2 of 27 slices shown]
[im 1/27]
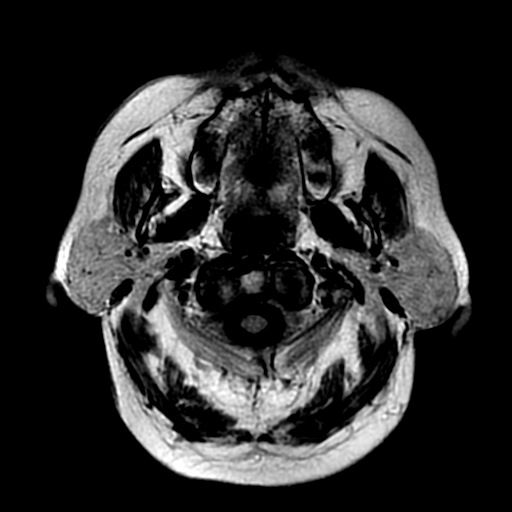
[im 27/27]
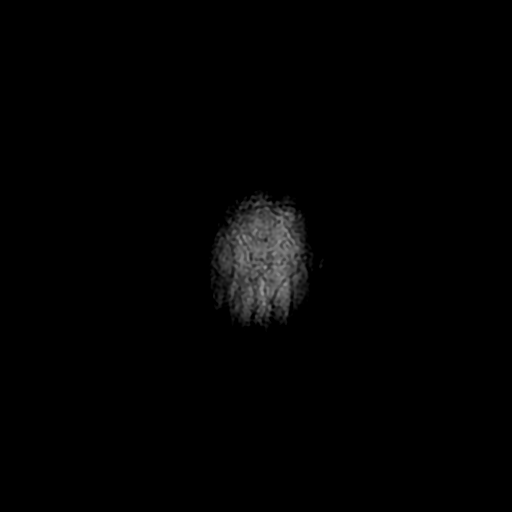

[Series 8: T2 · axial · 5.0mm · 0.43mm/px · z∈[-59,+97]mm · 2 of 27 slices shown (2 of 2)]
[im 1/27]
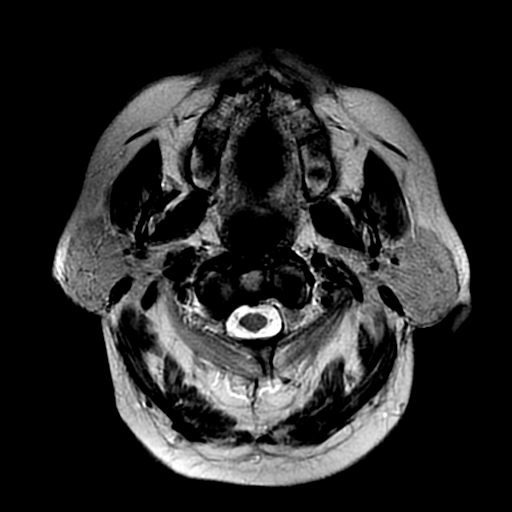
[im 27/27]
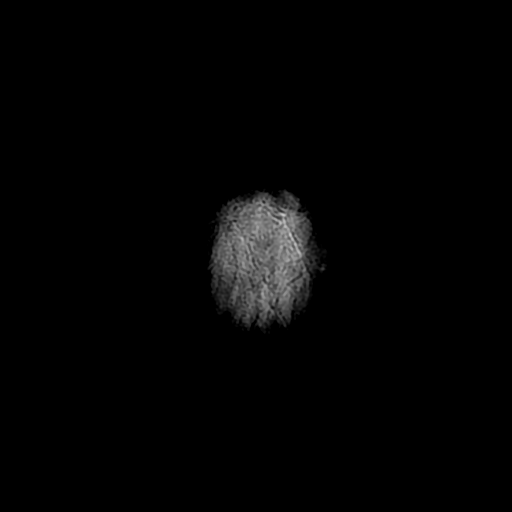

[Series 11: T2 post-contrast · coronal · 5.0mm · 0.45mm/px · 2 of 28 slices shown]
[im 1/28]
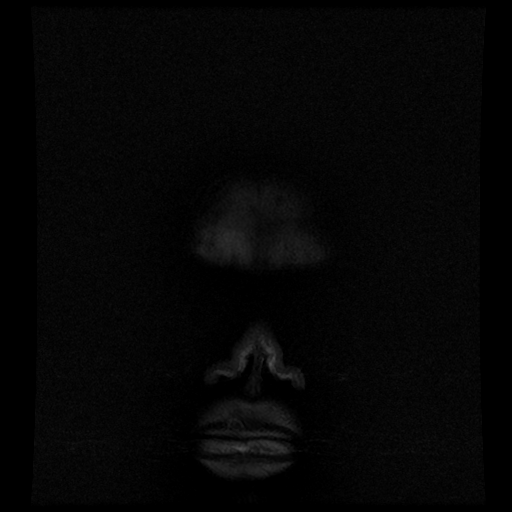
[im 28/28]
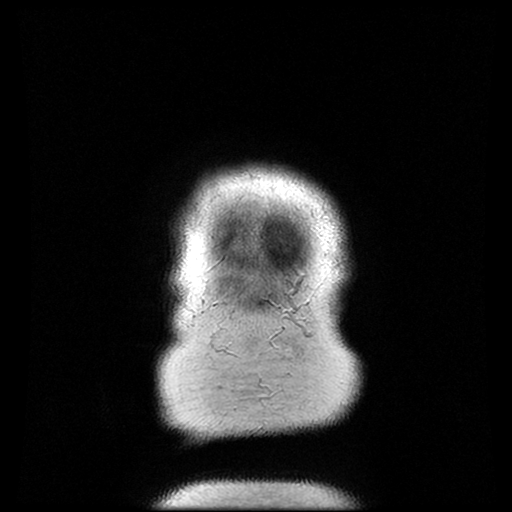

[Series 13: T1 post-contrast · coronal · 5.0mm · 0.45mm/px · 2 of 28 slices shown (1 of 2)]
[im 1/28]
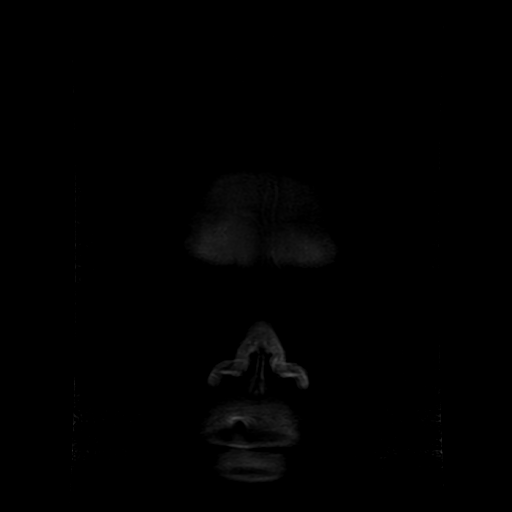
[im 28/28]
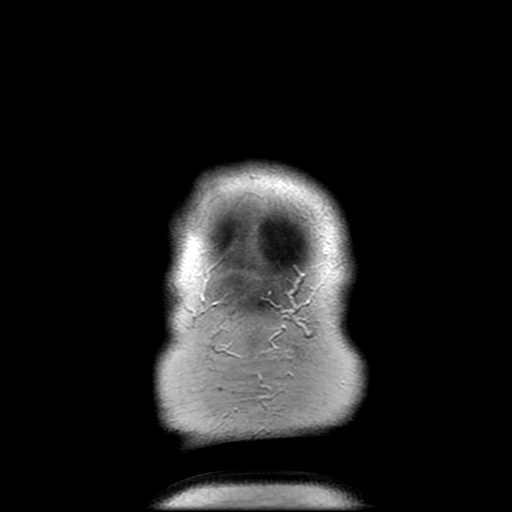

[Series 14: T1 post-contrast · sagittal · 5.0mm · 0.47mm/px · 2 of 24 slices shown (2 of 2)]
[im 1/24]
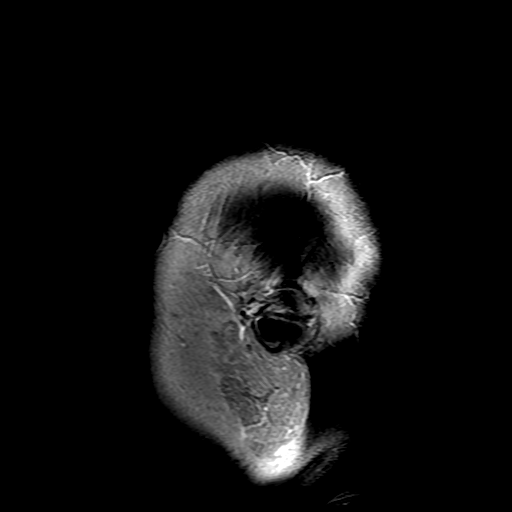
[im 24/24]
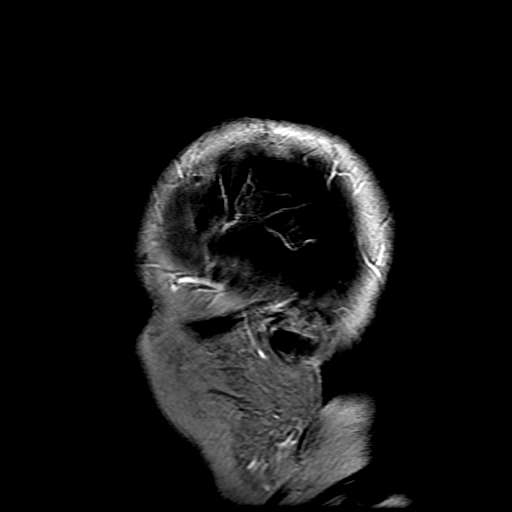

[Series 400: DWI · axial · 3.0mm · 1.09mm/px · z∈[-43,+104]mm · 4 of 50 slices shown (3 of 4)]
[im 1/50]
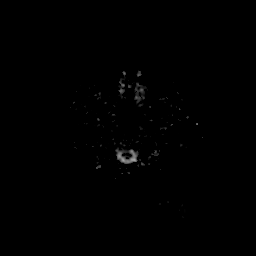
[im 17/50]
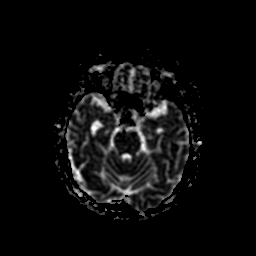
[im 33/50]
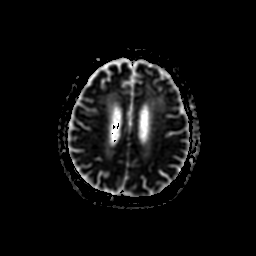
[im 50/50]
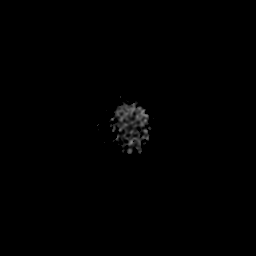

[Series 500: DWI · coronal · 4.0mm · 1.09mm/px · 3 of 35 slices shown (4 of 4)]
[im 1/35]
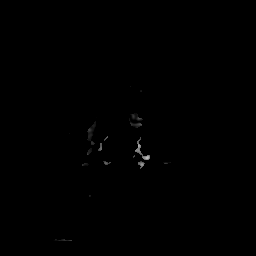
[im 18/35]
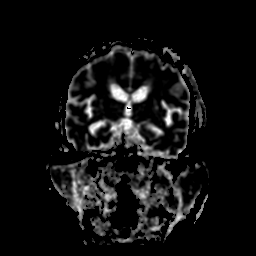
[im 35/35]
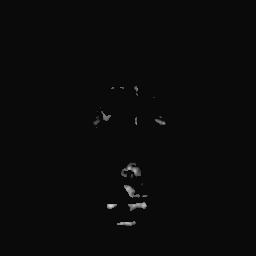

[33 of 48 positions shown; findings below may reference images not displayed]

FINDINGS: Brain: No acute stroke, acute hemorrhage, or extra-axial fluid. Mild
to moderate ventriculomegaly with focal and confluent subcortical
and periventricular white matter signal abnormality, stable.

Continued involution of vasogenic edema and mass effect related to
subtotal resection of a LEFT frontal glioblastoma. Blood products
along the resection site are redemonstrated, but not increased. The
series 12. Enhancement extends posteriorly along the cingulate gyrus
and surrounds the cavity, suggesting residual tumor; see for
instance series 13, image 17, and series 14, image 16. The
craniocaudal extent of enhancement on image 17 coronal is 24 mm,
decreased as compared with 32 mm prior.

Vascular: Flow voids are maintained.

Skull and upper cervical spine: Normal marrow signal. Cervical
spondylosis.

Sinuses/Orbits: Negative.

Other: None.
IMPRESSION: Improved appearance status post subtotal LEFT frontal glioblastoma
resection. Decreased overall enhancement, smaller resection cavity,
and less vasogenic edema. Continued surveillance warranted.

## 2019-02-02 ENCOUNTER — Inpatient Hospital Stay (HOSPITAL_COMMUNITY)
Admission: EM | Admit: 2019-02-02 | Discharge: 2019-02-06 | DRG: 100 | Disposition: A | Discharge status: Rehabilitation Facility | Payer: Medicare Other | Attending: Internal Medicine | Admitting: Internal Medicine

## 2019-02-02 ENCOUNTER — Other Ambulatory Visit: Payer: Self-pay

## 2019-02-02 ENCOUNTER — Emergency Department (HOSPITAL_COMMUNITY): Payer: Medicare Other

## 2019-02-02 ENCOUNTER — Encounter (HOSPITAL_COMMUNITY): Payer: Self-pay | Admitting: Emergency Medicine

## 2019-02-02 DIAGNOSIS — I2699 Other pulmonary embolism without acute cor pulmonale: Secondary | ICD-10-CM | POA: Diagnosis present

## 2019-02-02 DIAGNOSIS — R29810 Facial weakness: Secondary | ICD-10-CM | POA: Diagnosis present

## 2019-02-02 DIAGNOSIS — Z823 Family history of stroke: Secondary | ICD-10-CM

## 2019-02-02 DIAGNOSIS — G40409 Other generalized epilepsy and epileptic syndromes, not intractable, without status epilepticus: Secondary | ICD-10-CM | POA: Diagnosis not present

## 2019-02-02 DIAGNOSIS — D332 Benign neoplasm of brain, unspecified: Secondary | ICD-10-CM | POA: Diagnosis present

## 2019-02-02 DIAGNOSIS — R569 Unspecified convulsions: Secondary | ICD-10-CM

## 2019-02-02 DIAGNOSIS — R531 Weakness: Secondary | ICD-10-CM | POA: Diagnosis not present

## 2019-02-02 DIAGNOSIS — Z1159 Encounter for screening for other viral diseases: Secondary | ICD-10-CM

## 2019-02-02 DIAGNOSIS — Z7901 Long term (current) use of anticoagulants: Secondary | ICD-10-CM

## 2019-02-02 DIAGNOSIS — G8384 Todd's paralysis (postepileptic): Secondary | ICD-10-CM | POA: Diagnosis present

## 2019-02-02 DIAGNOSIS — G9341 Metabolic encephalopathy: Secondary | ICD-10-CM | POA: Diagnosis present

## 2019-02-02 DIAGNOSIS — C711 Malignant neoplasm of frontal lobe: Secondary | ICD-10-CM

## 2019-02-02 DIAGNOSIS — R4701 Aphasia: Secondary | ICD-10-CM | POA: Diagnosis present

## 2019-02-02 DIAGNOSIS — G8191 Hemiplegia, unspecified affecting right dominant side: Secondary | ICD-10-CM | POA: Diagnosis present

## 2019-02-02 DIAGNOSIS — Z85841 Personal history of malignant neoplasm of brain: Secondary | ICD-10-CM

## 2019-02-02 DIAGNOSIS — Z79899 Other long term (current) drug therapy: Secondary | ICD-10-CM

## 2019-02-02 DIAGNOSIS — Z923 Personal history of irradiation: Secondary | ICD-10-CM

## 2019-02-02 DIAGNOSIS — C7250 Malignant neoplasm of unspecified cranial nerve: Secondary | ICD-10-CM | POA: Diagnosis present

## 2019-02-02 LAB — SARS CORONAVIRUS 2 BY RT PCR (HOSPITAL ORDER, PERFORMED IN ~~LOC~~ HOSPITAL LAB): SARS Coronavirus 2: NEGATIVE

## 2019-02-02 LAB — I-STAT CHEM 8, ED
BUN: 9 mg/dL (ref 8–23)
Calcium, Ion: 1.16 mmol/L (ref 1.15–1.40)
Chloride: 104 mmol/L (ref 98–111)
Creatinine, Ser: 0.8 mg/dL (ref 0.61–1.24)
Glucose, Bld: 108 mg/dL — ABNORMAL HIGH (ref 70–99)
HCT: 39 % (ref 39.0–52.0)
Hemoglobin: 13.3 g/dL (ref 13.0–17.0)
Potassium: 3.7 mmol/L (ref 3.5–5.1)
Sodium: 140 mmol/L (ref 135–145)
TCO2: 24 mmol/L (ref 22–32)

## 2019-02-02 LAB — COMPREHENSIVE METABOLIC PANEL
ALT: 12 U/L (ref 0–44)
AST: 18 U/L (ref 15–41)
Albumin: 4.2 g/dL (ref 3.5–5.0)
Alkaline Phosphatase: 60 U/L (ref 38–126)
Anion gap: 9 (ref 5–15)
BUN: 9 mg/dL (ref 8–23)
CO2: 24 mmol/L (ref 22–32)
Calcium: 9.4 mg/dL (ref 8.9–10.3)
Chloride: 107 mmol/L (ref 98–111)
Creatinine, Ser: 0.89 mg/dL (ref 0.61–1.24)
GFR calc Af Amer: >60 mL/min (ref >60)
GFR calc non Af Amer: >60 mL/min (ref >60)
Glucose, Bld: 113 mg/dL — ABNORMAL HIGH (ref 70–99)
Potassium: 3.7 mmol/L (ref 3.5–5.1)
Sodium: 140 mmol/L (ref 135–145)
Total Bilirubin: 0.9 mg/dL (ref 0.3–1.2)
Total Protein: 6.3 g/dL — ABNORMAL LOW (ref 6.5–8.1)

## 2019-02-02 LAB — PROTIME-INR
INR: 1.2 (ref 0.8–1.2)
Prothrombin Time: 15.1 seconds (ref 11.4–15.2)

## 2019-02-02 LAB — DIFFERENTIAL
Abs Immature Granulocytes: 0.02 10*3/uL (ref 0.00–0.07)
Basophils Absolute: 0.1 10*3/uL (ref 0.0–0.1)
Basophils Relative: 1 %
Eosinophils Absolute: 0.1 10*3/uL (ref 0.0–0.5)
Eosinophils Relative: 1 %
Immature Granulocytes: 0 %
Lymphocytes Relative: 43 %
Lymphs Abs: 3.1 10*3/uL (ref 0.7–4.0)
Monocytes Absolute: 0.7 10*3/uL (ref 0.1–1.0)
Monocytes Relative: 9 %
Neutro Abs: 3.3 10*3/uL (ref 1.7–7.7)
Neutrophils Relative %: 46 %

## 2019-02-02 LAB — CBC
HCT: 39.5 % (ref 39.0–52.0)
Hemoglobin: 13.2 g/dL (ref 13.0–17.0)
MCH: 30 pg (ref 26.0–34.0)
MCHC: 33.4 g/dL (ref 30.0–36.0)
MCV: 89.8 fL (ref 80.0–100.0)
Platelets: 169 10*3/uL (ref 150–400)
RBC: 4.4 MIL/uL (ref 4.22–5.81)
RDW: 12.5 % (ref 11.5–15.5)
WBC: 7.2 10*3/uL (ref 4.0–10.5)
nRBC: 0 % (ref 0.0–0.2)

## 2019-02-02 LAB — APTT: aPTT: 27 seconds (ref 24–36)

## 2019-02-02 MED ORDER — VITAMIN B-12 1000 MCG PO TABS
500.0000 ug | ORAL_TABLET | Freq: Every day | ORAL | Status: DC
  Administered 2019-02-03 – 2019-02-06 (×4): 500 ug via ORAL
  Filled 2019-02-02 (×4): qty 1

## 2019-02-02 MED ORDER — IOHEXOL 350 MG/ML SOLN
75.0000 mL | Freq: Once | INTRAVENOUS | Status: AC | PRN
  Administered 2019-02-02: 75 mL via INTRAVENOUS

## 2019-02-02 MED ORDER — LORAZEPAM 2 MG/ML IJ SOLN
1.0000 mg | Freq: Once | INTRAMUSCULAR | Status: AC
  Administered 2019-02-02: 1 mg via INTRAVENOUS
  Filled 2019-02-02: qty 1

## 2019-02-02 MED ORDER — SODIUM CHLORIDE 0.9% FLUSH: 3.0000 mL | Freq: Once | INTRAVENOUS | Status: DC

## 2019-02-02 MED ORDER — ACETAMINOPHEN 650 MG RE SUPP: 650.0000 mg | RECTAL | Status: DC | PRN

## 2019-02-02 MED ORDER — STROKE: EARLY STAGES OF RECOVERY BOOK
Freq: Once | Status: AC
  Administered 2019-02-03
  Filled 2019-02-02: qty 1

## 2019-02-02 MED ORDER — APIXABAN 5 MG PO TABS
5.0000 mg | ORAL_TABLET | Freq: Two times a day (BID) | ORAL | Status: DC
  Administered 2019-02-02 – 2019-02-06 (×8): 5 mg via ORAL
  Filled 2019-02-02 (×8): qty 1

## 2019-02-02 MED ORDER — ACETAMINOPHEN 160 MG/5ML PO SOLN: 650.0000 mg | ORAL | Status: DC | PRN

## 2019-02-02 MED ORDER — LORAZEPAM 2 MG/ML IJ SOLN: 1.0000 mg | INTRAMUSCULAR | Status: DC | PRN

## 2019-02-02 MED ORDER — ACETAMINOPHEN 325 MG PO TABS: 650.0000 mg | ORAL_TABLET | ORAL | Status: DC | PRN

## 2019-02-02 MED ORDER — LEVETIRACETAM IN NACL 500 MG/100ML IV SOLN
500.0000 mg | Freq: Once | INTRAVENOUS | Status: AC
  Administered 2019-02-02: 22:00:00 500 mg via INTRAVENOUS
  Filled 2019-02-02: qty 100

## 2019-02-02 MED ORDER — LEVETIRACETAM IN NACL 1500 MG/100ML IV SOLN
1500.0000 mg | Freq: Two times a day (BID) | INTRAVENOUS | Status: DC
  Administered 2019-02-03 – 2019-02-04 (×4): 1500 mg via INTRAVENOUS
  Filled 2019-02-02 (×4): qty 100

## 2019-02-02 MED ORDER — ADULT MULTIVITAMIN W/MINERALS CH
1.0000 | ORAL_TABLET | Freq: Every day | ORAL | Status: DC
  Administered 2019-02-03 – 2019-02-06 (×4): 1 via ORAL
  Filled 2019-02-02 (×4): qty 1

## 2019-02-02 MED ORDER — SENNOSIDES-DOCUSATE SODIUM 8.6-50 MG PO TABS: 1.0000 | ORAL_TABLET | Freq: Every evening | ORAL | Status: DC | PRN

## 2019-02-02 NOTE — ED Triage Notes (Signed)
Pt arrives via [facility] from home with his daughter. Pts daughter called out stating at [place] pt began having a R sided facial droop and weakness to the R side of his body. Upon ems arrival pt had slurred speech as well. Pt has known hx of brain tumor and grand mal seizure. Pts daughter denied any seizure like activity. Pt alert upon arrival, able to follow minimal commands and weakness still present to R arm, leg and slight facial droop noted.

## 2019-02-02 NOTE — Consult Note (Signed)
Neurology Consultation Reason for Consult: Right-sided weakness and aphasia Referring Physician: Carin Hock  CC: Right-sided weakness and aphasia  History is obtained from: Patient, daughter  HPI: Christopher Morris is a 78 y.o. male with a history of glioblastoma who presents with abrupt worsening of right-sided weakness and aphasia which started around 6:45 PM.  His daughter states that he tried to stand up from the couch and was having difficulty and she had to help him, then when he was trying to walk with his walker he was not gripping the right side.  She states that when she would lift up his right arm it would just flop back down by his side.  She also describes that the right side of his face seemed to "squinch up" but that there was no clear twitching, eyes deviation, loss of consciousness, or other clear seizure activity.  Of note, following a seizure in mid June, he had a very similar presentation with gradual improvement over the course of about 12 hours.  His daughter describes almost complete flaccidity on the right, in route, he had improvement in the strength in the right side, and has begun following some commands which is something that he was not doing on scene for EMS.   LKW: 6 PM tpa given?: no, on Eliquis    ROS:  Unable to obtain due to altered mental status.   Past Medical History:  Diagnosis Date  . Brain cancer ([place])   . Glioblastoma ([place]) 09/25/2017  . Hyperglycemia 09/25/2017  . Seizure ([place]) 09/25/2017    Family History  Problem Relation Age of Onset  . Hypertension Daughter   . Stroke Mother   . Cancer Neg Hx      Social History:  reports that he has never smoked. He has never used smokeless tobacco. He reports that he does not drink alcohol or use drugs.   Exam: Current vital signs: BP 132/78   Pulse 84   Temp 98.5 F (36.9 C) (Oral)   Resp 17   SpO2 97%  Vital signs in last 24 hours: Temp:  [98.5 F (36.9 C)] 98.5 F (36.9 C) (07/20  1947) Pulse Rate:  [84] 84 (07/20 1947) Resp:  [17] 17 (07/20 1947) BP: (132)/(78) 132/78 (07/20 1947) SpO2:  [97 %] 97 % (07/20 1947)   Physical Exam  Constitutional: Appears well-developed and well-nourished.  Psych: Affect appropriate to situation Eyes: No scleral injection HENT: No OP obstrucion Head: Normocephalic.  Cardiovascular: Normal rate and regular rhythm.  Respiratory: Effort normal, non-labored breathing GI: Soft.  No distension. There is no tenderness.  Skin: WDI  Neuro: Mental Status: Awake, alert, able to say a couple of words, but not answer questions, able to follow some commands. Cranial Nerves: II: Appears back to threat bilaterally. pupils are equal, round, and reactive to light.   III,IV, VI: Crosses midline in both directions V, VII: He has a right facial decrease in his nasolabial fold Motor: He has a mild right hemiparesis with 4/5 strength in the right arm and 4+/5 strength in the right leg  sensory: He responds to noxious stimulation bilaterally  Cerebellar: He does not perform, but no clear ataxia    I have reviewed labs in epic and the results pertinent to this consultation are: CMP-unremarkable   I have reviewed the images obtained: CT/CTA-no acute changes  Impression: 78 year old male presenting with right-sided weakness and aphasia in the setting of glioblastoma.  His symptoms appear to be improving, and I suspect that he  had a focal seizure and is having gradual improvement.  Of note, after previous seizure he had a very prolonged postictal state with gradual improvement.  Since there was no clear seizure-like activity, I do think that an MRI to rule out ischemic stroke as etiology would be prudent.  Recommendations: 1) additional Keppra 500 mg x 1, Ativan 1 mg. 2) increase Keppra dose to 1500 twice daily 3) MRI brain 4) EEG in a.m. 5) neurology will follow   Roland Rack, MD [facility] [phone]  If 7pm-  7am, please page neurology on call as listed in [place].

## 2019-02-02 NOTE — ED Notes (Signed)
Dr. Kirkpatrick at bedside 

## 2019-02-02 NOTE — H&P (Addendum)
History and Physical    Christopher Morris CLE:[id number] DOB: 11-11-1940 DOA: 02/02/2019  Referring MD/NP/PA:   PCP: [facility]   Patient coming from:  The patient is coming from home.  At baseline, pt is partially dependent for most of ADL.        Chief Complaint: Right-sided weakness.  HPI: Christopher Morris is a 78 y.o. male with medical history significant of barin glioblastoma (s/p of surgical resection, radiation and chemotherapy), grand mal seizure, PE on Eliquis, who presents with right-sided weakness.  Per her daughter (I spoke with her daughter on the phone), pt was noted to have right-sided weakness and difficulty speaking at about 18:45. Per her daughter, pt has weakness in right arm and right leg. His daughter describes almost complete flaccidity on the right, in route, he had improvement in the strength in the right side, and has begun following some commands which is something that he was not doing on scene for EMS. She also describes that the right side of his face seemed to "squinch up". No clear seizure activity noted.  Her daughter states that patient has mild dry cough which is at the baseline, no chest pain, shortness of breath.  No fever or chills.  Patient does not have nausea vomiting, diarrhea, abdominal pain.  Patient does not seem to have symptoms of UTI. Pt is confused, and is not oriented x 3 when I saw pt.  ED Course: pt was found to have INR 1.2, PTT 27, pending COVID-19 test, electrolytes renal function okay, temperature normal, blood pressure 140/67, heart rate 84, RR 18, oxygen saturation 97% on room air. Pt is placed on tele bed for obs. Neuro, Dr. Leonel Ramsay was consulted.  CT-head: No sign of ischemic disease in this patient with a known left frontal glioblastoma, extensive left frontal vasogenic edema and widespread white matter changes subsequent radiation. No change since the previous studies of last month.  CTA of head and neck:  No acute  vascular finding. No change since 1 month ago. Normal except for minimal nonstenotic atherosclerotic change at the bifurcation regions and siphons and some atherosclerotic narrowing and irregularity of the PCA branches.   Review of Systems: Could not be reviewed accurately due to confusion    Allergy:  Allergies  Allergen Reactions   Pork-Derived Products     Patient does not eat pork   Shellfish Allergy     Patient does not eat shellfish    Past Medical History:  Diagnosis Date   Brain cancer ([place])    Glioblastoma ([place]) 09/25/2017   Hyperglycemia 09/25/2017   Seizure ([place]) 09/25/2017    Past Surgical History:  Procedure Laterality Date   APPLICATION OF CRANIAL NAVIGATION N/A 09/30/2017   Procedure: APPLICATION OF CRANIAL NAVIGATION;  Surgeon: [name], MD;  Location: [place] [facility];  Service: Neurosurgery;  Laterality: N/A;   CRANIOTOMY N/A 09/30/2017   Procedure: CRANIOTOMY FOR RESECTION OF BRAIN TUMOR WITH STEREOTACTIC;  Surgeon: [name], MD;  Location: [place];  Service: Neurosurgery;  Laterality: N/A;  CRANIOTOMY FOR RESECTION OF BRAIN TUMOR WITH STEREOTACTIC    Social History:  reports that he has never smoked. He has never used smokeless tobacco. He reports that he does not drink alcohol or use drugs.  Family History:  Family History  Problem Relation Age of Onset   Hypertension Daughter    Stroke Mother    Cancer Neg Hx      Prior to Admission medications   Medication Sig Start Date End Date Taking?  Authorizing Provider  apixaban (ELIQUIS) 5 MG TABS tablet Take 10 mg BID till 12/24/2017, start taking 5 mg BID from 12/25/2017 Patient taking differently: Take 5 mg by mouth 2 (two) times daily.  12/20/17   Lavina Hamman, MD  levETIRAcetam (KEPPRA) 250 MG tablet Take 5 tablets (1,250 mg total) by mouth 2 (two) times daily. 12/30/18 03/30/19  Cherylann Ratel A, DO  Multiple Vitamin (MULTIVITAMIN) tablet Take 1 tablet by mouth daily.    [provider]  vitamin B-12 (CYANOCOBALAMIN) 500 MCG tablet Take 500 mcg by mouth daily.    [provider]    Physical Exam: Vitals:   02/02/19 2030 02/02/19 2045 02/02/19 2100 02/02/19 2115  BP: 125/68 129/76 131/72 136/73  Pulse:      Resp: 17 18 19 17   Temp:      TempSrc:      SpO2:       General: Not in acute distress HEENT:       Eyes: PERRL, EOMI, no scleral icterus.       ENT: No discharge from the ears and nose, no pharynx injection, no tonsillar enlargement.        Neck: No JVD, no bruit, no mass felt. Heme: No neck lymph node enlargement. Cardiac: S1/S2, RRR, No murmurs, No gallops or rubs. Respiratory: No rales, wheezing, rhonchi or rubs. GI: Soft, nondistended, no rebound pain, no organomegaly, BS present. GU: No hematuria Ext: No pitting leg edema bilaterally. 2+DP/PT pulse bilaterally. Musculoskeletal: No joint deformities, No joint redness or warmth, no limitation of ROM in spin. Skin: No rashes.  Neuro: pt is confused, is not oriented X3, cranial nerves II-XII grossly intact, moves all extremities normally. Muscle strength 5/5 in all extremities. Brachial reflex 2+ bilaterally.  Psych: Patient is not psychotic, no suicidal or hemocidal ideation.  Labs on Admission: I have personally reviewed following labs and imaging studies  CBC: Recent Labs  Lab 02/02/19 1925 02/02/19 1931  WBC 7.2  --   NEUTROABS 3.3  --   HGB 13.2 13.3  HCT 39.5 39.0  MCV 89.8  --   PLT 169  --    Basic Metabolic Panel: Recent Labs  Lab 02/02/19 1925 02/02/19 1931  NA 140 140  K 3.7 3.7  CL 107 104  CO2 24  --   GLUCOSE 113* 108*  BUN 9 9  CREATININE 0.89 0.80  CALCIUM 9.4  --    GFR: CrCl cannot be calculated (Unknown ideal weight.). Liver Function Tests: Recent Labs  Lab 02/02/19 1925  AST 18  ALT 12  ALKPHOS 60  BILITOT 0.9  PROT 6.3*  ALBUMIN 4.2   No results for input(s): LIPASE, AMYLASE in the last 168 hours. No results for input(s): AMMONIA in the last  168 hours. Coagulation Profile: Recent Labs  Lab 02/02/19 1925  INR 1.2   Cardiac Enzymes: No results for input(s): CKTOTAL, CKMB, CKMBINDEX, TROPONINI in the last 168 hours. BNP (last 3 results) No results for input(s): PROBNP in the last 8760 hours. HbA1C: No results for input(s): HGBA1C in the last 72 hours. CBG: No results for input(s): GLUCAP in the last 168 hours. Lipid Profile: No results for input(s): CHOL, HDL, LDLCALC, TRIG, CHOLHDL, LDLDIRECT in the last 72 hours. Thyroid Function Tests: No results for input(s): TSH, T4TOTAL, FREET4, T3FREE, THYROIDAB in the last 72 hours. Anemia Panel: No results for input(s): VITAMINB12, FOLATE, FERRITIN, TIBC, IRON, RETICCTPCT in the last 72 hours. Urine analysis:    Component Value Date/Time  COLORURINE YELLOW 12/18/2017 0227   APPEARANCEUR CLEAR 12/18/2017 0227   LABSPEC 1.012 12/18/2017 0227   PHURINE 5.0 12/18/2017 0227   GLUCOSEU NEGATIVE 12/18/2017 0227   HGBUR NEGATIVE 12/18/2017 0227   BILIRUBINUR NEGATIVE 12/18/2017 0227   KETONESUR NEGATIVE 12/18/2017 0227   PROTEINUR NEGATIVE 12/18/2017 0227   NITRITE NEGATIVE 12/18/2017 0227   LEUKOCYTESUR NEGATIVE 12/18/2017 0227   Sepsis Labs: @LABRCNTIP (procalcitonin:4,lacticidven:4) )No results found for this or any previous visit (from the past 240 hour(s)).   Radiological Exams on Admission: Ct Angio Head W Or Wo Contrast  Result Date: 02/02/2019 CLINICAL DATA:  Acute presentation with right arm weakness and slurred speech EXAM: CT ANGIOGRAPHY HEAD AND NECK TECHNIQUE: Multidetector CT imaging of the head and neck was performed using the standard protocol during bolus administration of intravenous contrast. Multiplanar CT image reconstructions and MIPs were obtained to evaluate the vascular anatomy. Carotid stenosis measurements (when applicable) are obtained utilizing NASCET criteria, using the distal internal carotid diameter as the denominator. CONTRAST:  20mL OMNIPAQUE  IOHEXOL 350 MG/ML SOLN COMPARISON:  Head CT same day.  MRI and CT studies 12/29/2018 FINDINGS: CTA NECK FINDINGS Aortic arch: Aortic atherosclerosis. No aneurysm or dissection. Branching pattern is normal without origin stenosis. Right carotid system: Common carotid artery widely patent to the bifurcation. Carotid bifurcation shows minimal atherosclerotic disease but no stenosis or irregularity. Cervical ICA is tortuous but widely patent. Left carotid system: Left common carotid artery is widely patent to the bifurcation. Carotid bifurcation is widely patent. Cervical ICA is normal. Vertebral arteries: Both vertebral artery origins are widely patent. Both vertebral arteries are normal through the cervical region to the foramen magnum. Skeleton: Advanced chronic cervical spondylosis as seen previously. Other neck: No soft tissue mass or lymphadenopathy. Upper chest: Negative Review of the MIP images confirms the above findings CTA HEAD FINDINGS Anterior circulation: Both internal carotid arteries are patent through the skull base and siphon regions. Siphon atherosclerotic calcification but no stenosis. The anterior and middle cerebral vessels are patent without proximal stenosis, aneurysm or vascular malformation. No large or medium vessel occlusion. Posterior circulation: Both vertebral arteries are widely patent to the basilar. No basilar stenosis. Posterior circulation branch vessels are patent. Mild irregularity of both PCA vessels as shown previously. Venous sinuses: Patent and normal. Anatomic variants: None significant. Delayed phase: Not performed. Review of the MIP images confirms the above findings IMPRESSION: No acute vascular finding. No change since 1 month ago. Normal except for minimal nonstenotic atherosclerotic change at the bifurcation regions and siphons and some atherosclerotic narrowing and irregularity of the PCA branches. These results were communicated to Dr. Leonel Ramsay at 7:48 pmon  7/20/2020by text page via the [facility] messaging system. Electronically Signed   By: Nelson Chimes M.D.   On: 02/02/2019 19:50   Ct Angio Neck W Or Wo Contrast  Result Date: 02/02/2019 CLINICAL DATA:  Acute presentation with right arm weakness and slurred speech EXAM: CT ANGIOGRAPHY HEAD AND NECK TECHNIQUE: Multidetector CT imaging of the head and neck was performed using the standard protocol during bolus administration of intravenous contrast. Multiplanar CT image reconstructions and MIPs were obtained to evaluate the vascular anatomy. Carotid stenosis measurements (when applicable) are obtained utilizing NASCET criteria, using the distal internal carotid diameter as the denominator. CONTRAST:  83mL OMNIPAQUE IOHEXOL 350 MG/ML SOLN COMPARISON:  Head CT same day.  MRI and CT studies 12/29/2018 FINDINGS: CTA NECK FINDINGS Aortic arch: Aortic atherosclerosis. No aneurysm or dissection. Branching pattern is normal without origin stenosis. Right carotid  system: Common carotid artery widely patent to the bifurcation. Carotid bifurcation shows minimal atherosclerotic disease but no stenosis or irregularity. Cervical ICA is tortuous but widely patent. Left carotid system: Left common carotid artery is widely patent to the bifurcation. Carotid bifurcation is widely patent. Cervical ICA is normal. Vertebral arteries: Both vertebral artery origins are widely patent. Both vertebral arteries are normal through the cervical region to the foramen magnum. Skeleton: Advanced chronic cervical spondylosis as seen previously. Other neck: No soft tissue mass or lymphadenopathy. Upper chest: Negative Review of the MIP images confirms the above findings CTA HEAD FINDINGS Anterior circulation: Both internal carotid arteries are patent through the skull base and siphon regions. Siphon atherosclerotic calcification but no stenosis. The anterior and middle cerebral vessels are patent without proximal stenosis, aneurysm or vascular  malformation. No large or medium vessel occlusion. Posterior circulation: Both vertebral arteries are widely patent to the basilar. No basilar stenosis. Posterior circulation branch vessels are patent. Mild irregularity of both PCA vessels as shown previously. Venous sinuses: Patent and normal. Anatomic variants: None significant. Delayed phase: Not performed. Review of the MIP images confirms the above findings IMPRESSION: No acute vascular finding. No change since 1 month ago. Normal except for minimal nonstenotic atherosclerotic change at the bifurcation regions and siphons and some atherosclerotic narrowing and irregularity of the PCA branches. These results were communicated to Dr. Leonel Ramsay at 7:48 pmon 7/20/2020by text page via the [facility] messaging system. Electronically Signed   By: Nelson Chimes M.D.   On: 02/02/2019 19:50   Ct Head Code Stroke Wo Contrast  Result Date: 02/02/2019 CLINICAL DATA:  Code stroke. Symptoms of right arm weakness and slurred speech. Glioblastoma. EXAM: CT HEAD WITHOUT CONTRAST TECHNIQUE: Contiguous axial images were obtained from the base of the skull through the vertex without intravenous contrast. COMPARISON:  MRI 12/29/2018.  CT 12/29/2018 FINDINGS: Brain: Previous left frontal craniotomy. 3 cm left frontal mass consistent with glioblastoma difficult to appreciate by noncontrast CT. Extensive vasogenic edema throughout the left hemisphere as seen previously. White matter low density consistent with previous radiation change as seen previously. No sign of acute infarction, hemorrhage, hydrocephalus or extra-axial collection. No midline shift. Vascular: No abnormal vascular finding. Skull: Left frontal craniotomy changes. Sinuses/Orbits: Negative Other: None ASPECTS ([place] Stroke Program Early CT Score) - Ganglionic level infarction (caudate, lentiform nuclei, internal capsule, insula, M1-M3 cortex): 7 - Supraganglionic infarction (M4-M6 cortex): 3 Total score (0-10 with 10  being normal): 10 IMPRESSION: 1. No sign of ischemic disease in this patient with a known left frontal glioblastoma, extensive left frontal vasogenic edema and widespread white matter changes subsequent radiation. No change since the previous studies of last month. 2. ASPECTS is 10. 3. These results were communicated to Dr. Leonel Ramsay at 7:34 pmon 7/20/2020by text page via the [facility] messaging system. Electronically Signed   By: Nelson Chimes M.D.   On: 02/02/2019 19:36     EKG: Independently reviewed.  Sinus rhythm, QTC 438, early R wave progression  Assessment/Plan Principal Problem:   Right sided weakness Active Problems:   Seizure (HCC)   Frontal glioblastoma multiforme (HCC)   Pulmonary embolism (HCC)   Acute metabolic encephalopathy   Right sided weakness, aphasia and acute metabolic encephalopathy: pt had AMS, aphasia, right facial droop and right sided weakness. Etiology is not clear, differential diagnosis mainly includes seizure versus stroke.  CT-head and CTA of head and neck are negative for acute issues.  Neurology, Dr. Leonel Ramsay was consulted, who recommended load patient with Keppra  by IV. Currently, pt dose not seem have right sided weakness when I saws pt.  - Placed on telemetry bed for observation - will f/u Dr. Cecil Cobbs further recommendations -->MRI of brain and EEG - Patient was started with IV Keppra 1500 mg bid after received 500 mg of keppra - swallowing study - Frequent neuro check - pt/ot  Seizure -Seizure precaution -When necessary Ativan for seizure -on IV keppra   Frontal glioblastoma multiforme [facility]): s/p of craniotomy resection, chemo and radiation therapy. Pt is following up with Dr. Mickeal Skinner -Follow-up with oncologist,   Pulmonary embolism [facility]): per RN, Murray Hodgkins on the floor, pt passed swallowing screen on the floor. -continue eliquis now    DVT ppx: on eliquis Code Status: Full code per her daughter Family Communication:  Yes, patient's  daughter on the phone Disposition Plan:  Anticipate discharge back to previous home environment Consults called:  Dr. Leonel Ramsay of neurology Admission status: Obs / tele    Date of Service 02/02/2019    [facility]   If 7PM-7AM, please contact night-coverage [email or web address] Password [facility] 02/02/2019, 9:27 PM

## 2019-02-02 NOTE — ED Provider Notes (Signed)
[facility] EMERGENCY DEPARTMENT Provider Note   CSN: [id number] Arrival date & time: 02/02/19  1920     History   Chief Complaint Chief Complaint  Patient presents with   Code Stroke    HPI Roldan Laforest is a 78 y.o. male.  Patient presents by EMS as a stroke activation.  He has a known history of glioblastoma on the left side and was noted by family to acutely have a change in mental status where he was not using the right side of his body and he was having difficulty speaking.  He was emergently brought to the CAT scan or not by neurology.     The history is provided by the patient. The history is limited by the condition of the patient.  Cerebrovascular Accident This is a new problem. The current episode started 1 to 2 hours ago. The problem occurs constantly. The problem has not changed since onset.Nothing aggravates the symptoms. Nothing relieves the symptoms. He has tried nothing for the symptoms. The treatment provided no relief.    Past Medical History:  Diagnosis Date   Brain cancer ([place])    Glioblastoma ([place]) 09/25/2017   Hyperglycemia 09/25/2017   Seizure ([place]) 09/25/2017    Patient Active Problem List   Diagnosis Date Noted   Acute metabolic encephalopathy 24/03/7352   Aphasia 12/29/2018   Pneumonia 12/22/2017   HCAP (healthcare-associated pneumonia) 12/22/2017   Pulmonary embolism ([place]) 12/22/2017   Tachycardia    Vitamin B12 deficiency 11/14/2017   Anemia 11/14/2017   Fever 11/13/2017   Dehydration 11/13/2017   Interstitial pneumonia ([place]) 11/11/2017   Sepsis ([place]) 11/09/2017   Bacteremia due to Gram-negative bacteria 10/26/2017   Sepsis due to Escherichia coli (E. coli) ([place]) 10/26/2017   UTI (urinary tract infection) 10/25/2017   Seizure ([place]) 09/25/2017   Frontal glioblastoma multiforme ([place]) 09/25/2017   Hyperglycemia 09/25/2017   BLOOD IN STOOL 07/26/2008   ERECTILE DYSFUNCTION 07/12/2008    Past  Surgical History:  Procedure Laterality Date   APPLICATION OF CRANIAL NAVIGATION N/A 09/30/2017   Procedure: APPLICATION OF CRANIAL NAVIGATION;  Surgeon: [name], MD;  Location: [facility];  Service: Neurosurgery;  Laterality: N/A;   CRANIOTOMY N/A 09/30/2017   Procedure: CRANIOTOMY FOR RESECTION OF BRAIN TUMOR WITH STEREOTACTIC;  Surgeon: [name], MD;  Location: [facility];  Service: Neurosurgery;  Laterality: N/A;  CRANIOTOMY FOR RESECTION OF BRAIN TUMOR WITH STEREOTACTIC        Home Medications    Prior to Admission medications   Medication Sig Start Date End Date Taking? Authorizing Provider  apixaban (ELIQUIS) 5 MG TABS tablet Take 10 mg BID till 12/24/2017, start taking 5 mg BID from 12/25/2017 Patient taking differently: Take 5 mg by mouth 2 (two) times daily.  12/20/17   Lavina Hamman, MD  levETIRAcetam (KEPPRA) 250 MG tablet Take 5 tablets (1,250 mg total) by mouth 2 (two) times daily. 12/30/18 03/30/19  Cherylann Ratel A, DO  Multiple Vitamin (MULTIVITAMIN) tablet Take 1 tablet by mouth daily.    [provider]  vitamin B-12 (CYANOCOBALAMIN) 500 MCG tablet Take 500 mcg by mouth daily.    [provider]    Family History Family History  Problem Relation Age of Onset   Hypertension Daughter    Stroke Mother    Cancer Neg Hx     Social History Social History   Tobacco Use   Smoking status: Never Smoker   Smokeless tobacco: Never Used  Substance Use Topics  Alcohol use: No    Frequency: Never   Drug use: No     Allergies   Pork-derived products and Shellfish allergy   Review of Systems Review of Systems  Unable to perform ROS: Mental status change     Physical Exam Updated Vital Signs BP 130/73 (BP Location: Right Arm)    Pulse 79    Temp 98.4 F (36.9 C) (Oral)    Resp 20    Ht 5\' 11"  (1.803 m)    Wt 97.3 kg    SpO2 96%    BMI 29.92 kg/m   Physical Exam Vitals signs and nursing note reviewed.  Constitutional:       Appearance: He is well-developed.  HENT:     Head: Normocephalic and atraumatic.  Eyes:     Conjunctiva/sclera: Conjunctivae normal.  Neck:     Musculoskeletal: Neck supple.  Cardiovascular:     Rate and Rhythm: Normal rate and regular rhythm.     Pulses: Normal pulses.     Heart sounds: No murmur.  Pulmonary:     Effort: Pulmonary effort is normal. No respiratory distress.     Breath sounds: Normal breath sounds.  Abdominal:     Palpations: Abdomen is soft.     Tenderness: There is no abdominal tenderness.  Musculoskeletal: Normal range of motion.        General: No tenderness or signs of injury.  Skin:    General: Skin is warm and dry.     Capillary Refill: Capillary refill takes less than 2 seconds.  Neurological:     Mental Status: He is alert.     Comments: Patient is awake and somewhat following commands now.  He still has some weakness in his right arm and right leg.  Speech is still off      ED Treatments / Results  Labs (all labs ordered are listed, but only abnormal results are displayed) Labs Reviewed  COMPREHENSIVE METABOLIC PANEL - Abnormal; Notable for the following components:      Result Value   Glucose, Bld 113 (*)    Total Protein 6.3 (*)    All other components within normal limits  HEMOGLOBIN A1C - Abnormal; Notable for the following components:   Hgb A1c MFr Bld 5.9 (*)    All other components within normal limits  LIPID PANEL - Abnormal; Notable for the following components:   LDL Cholesterol 108 (*)    All other components within normal limits  I-STAT CHEM 8, ED - Abnormal; Notable for the following components:   Glucose, Bld 108 (*)    All other components within normal limits  SARS CORONAVIRUS 2 (HOSPITAL ORDER, [facility])  PROTIME-INR  APTT  CBC  DIFFERENTIAL  CBG MONITORING, ED    EKG EKG Interpretation  Date/Time:  Monday February 02 2019 19:45:11 EDT Ventricular Rate:  86 PR Interval:    QRS  Duration: 93 QT Interval:  366 QTC Calculation: 438 R Axis:   39 Text Interpretation:  Sinus rhythm Abnormal R-wave progression, early transition similar to prior 6/20 Confirmed by Aletta Edouard [phone]) on 02/02/2019 7:48:11 PM   Radiology Ct Angio Head W Or Wo Contrast  Result Date: 02/02/2019 CLINICAL DATA:  Acute presentation with right arm weakness and slurred speech EXAM: CT ANGIOGRAPHY HEAD AND NECK TECHNIQUE: Multidetector CT imaging of the head and neck was performed using the standard protocol during bolus administration of intravenous contrast. Multiplanar CT image reconstructions and MIPs were obtained to  evaluate the vascular anatomy. Carotid stenosis measurements (when applicable) are obtained utilizing NASCET criteria, using the distal internal carotid diameter as the denominator. CONTRAST:  39mL OMNIPAQUE IOHEXOL 350 MG/ML SOLN COMPARISON:  Head CT same day.  MRI and CT studies 12/29/2018 FINDINGS: CTA NECK FINDINGS Aortic arch: Aortic atherosclerosis. No aneurysm or dissection. Branching pattern is normal without origin stenosis. Right carotid system: Common carotid artery widely patent to the bifurcation. Carotid bifurcation shows minimal atherosclerotic disease but no stenosis or irregularity. Cervical ICA is tortuous but widely patent. Left carotid system: Left common carotid artery is widely patent to the bifurcation. Carotid bifurcation is widely patent. Cervical ICA is normal. Vertebral arteries: Both vertebral artery origins are widely patent. Both vertebral arteries are normal through the cervical region to the foramen magnum. Skeleton: Advanced chronic cervical spondylosis as seen previously. Other neck: No soft tissue mass or lymphadenopathy. Upper chest: Negative Review of the MIP images confirms the above findings CTA HEAD FINDINGS Anterior circulation: Both internal carotid arteries are patent through the skull base and siphon regions. Siphon atherosclerotic calcification but  no stenosis. The anterior and middle cerebral vessels are patent without proximal stenosis, aneurysm or vascular malformation. No large or medium vessel occlusion. Posterior circulation: Both vertebral arteries are widely patent to the basilar. No basilar stenosis. Posterior circulation branch vessels are patent. Mild irregularity of both PCA vessels as shown previously. Venous sinuses: Patent and normal. Anatomic variants: None significant. Delayed phase: Not performed. Review of the MIP images confirms the above findings IMPRESSION: No acute vascular finding. No change since 1 month ago. Normal except for minimal nonstenotic atherosclerotic change at the bifurcation regions and siphons and some atherosclerotic narrowing and irregularity of the PCA branches. These results were communicated to Dr. Leonel Ramsay at 7:48 pmon 7/20/2020by text page via the [facility] messaging system. Electronically Signed   By: Nelson Chimes M.D.   On: 02/02/2019 19:50   Ct Angio Neck W Or Wo Contrast  Result Date: 02/02/2019 CLINICAL DATA:  Acute presentation with right arm weakness and slurred speech EXAM: CT ANGIOGRAPHY HEAD AND NECK TECHNIQUE: Multidetector CT imaging of the head and neck was performed using the standard protocol during bolus administration of intravenous contrast. Multiplanar CT image reconstructions and MIPs were obtained to evaluate the vascular anatomy. Carotid stenosis measurements (when applicable) are obtained utilizing NASCET criteria, using the distal internal carotid diameter as the denominator. CONTRAST:  37mL OMNIPAQUE IOHEXOL 350 MG/ML SOLN COMPARISON:  Head CT same day.  MRI and CT studies 12/29/2018 FINDINGS: CTA NECK FINDINGS Aortic arch: Aortic atherosclerosis. No aneurysm or dissection. Branching pattern is normal without origin stenosis. Right carotid system: Common carotid artery widely patent to the bifurcation. Carotid bifurcation shows minimal atherosclerotic disease but no stenosis or  irregularity. Cervical ICA is tortuous but widely patent. Left carotid system: Left common carotid artery is widely patent to the bifurcation. Carotid bifurcation is widely patent. Cervical ICA is normal. Vertebral arteries: Both vertebral artery origins are widely patent. Both vertebral arteries are normal through the cervical region to the foramen magnum. Skeleton: Advanced chronic cervical spondylosis as seen previously. Other neck: No soft tissue mass or lymphadenopathy. Upper chest: Negative Review of the MIP images confirms the above findings CTA HEAD FINDINGS Anterior circulation: Both internal carotid arteries are patent through the skull base and siphon regions. Siphon atherosclerotic calcification but no stenosis. The anterior and middle cerebral vessels are patent without proximal stenosis, aneurysm or vascular malformation. No large or medium vessel occlusion. Posterior circulation: Both vertebral  arteries are widely patent to the basilar. No basilar stenosis. Posterior circulation branch vessels are patent. Mild irregularity of both PCA vessels as shown previously. Venous sinuses: Patent and normal. Anatomic variants: None significant. Delayed phase: Not performed. Review of the MIP images confirms the above findings IMPRESSION: No acute vascular finding. No change since 1 month ago. Normal except for minimal nonstenotic atherosclerotic change at the bifurcation regions and siphons and some atherosclerotic narrowing and irregularity of the PCA branches. These results were communicated to Dr. Leonel Ramsay at 7:48 pmon 7/20/2020by text page via the [facility] messaging system. Electronically Signed   By: Nelson Chimes M.D.   On: 02/02/2019 19:50   Mr Brain Wo Contrast  Result Date: 02/03/2019 EXAM: MRI HEAD WITHOUT CONTRAST TECHNIQUE: Multiplanar, multiecho pulse sequences of the brain and surrounding structures were obtained without intravenous contrast. COMPARISON:  None. FINDINGS: Brain: There is no acute  ischemia. There is a large area of hyperintense T2-weighted signal centered within the left frontal lobe, unchanged from the prior study. This is superimposed on diffuse periventricular white matter hyperintensity. No contrast agent was administered on the current examination, limiting comparison to the prior study. There are areas of chronic hemorrhage at the left frontal lobe resection site. There is no midline shift or other mass effect. No acute intracranial hemorrhage. Vascular: The major intracranial arterial and venous sinus flow voids are preserved. Skull and upper cervical spine: Remote left pterional craniotomy. Sinuses/Orbits: No fluid levels or advanced mucosal thickening of the visualized paranasal sinuses. No mastoid or middle ear effusion. The orbits are normal. Other: None IMPRESSION: 1. Unchanged appearance of large amount of hyperintense T2-weighted signal centered in the left frontal lobe, which may indicate a combination of edema and/or nonenhancing tumor. 2. Comparison otherwise limited by lack of intravenous contrast material on the current study. 3. No acute ischemia. Electronically Signed   By: Ulyses Jarred M.D.   On: 02/03/2019 03:01   Ct Head Code Stroke Wo Contrast  Result Date: 02/02/2019 CLINICAL DATA:  Code stroke. Symptoms of right arm weakness and slurred speech. Glioblastoma. EXAM: CT HEAD WITHOUT CONTRAST TECHNIQUE: Contiguous axial images were obtained from the base of the skull through the vertex without intravenous contrast. COMPARISON:  MRI 12/29/2018.  CT 12/29/2018 FINDINGS: Brain: Previous left frontal craniotomy. 3 cm left frontal mass consistent with glioblastoma difficult to appreciate by noncontrast CT. Extensive vasogenic edema throughout the left hemisphere as seen previously. White matter low density consistent with previous radiation change as seen previously. No sign of acute infarction, hemorrhage, hydrocephalus or extra-axial collection. No midline shift.  Vascular: No abnormal vascular finding. Skull: Left frontal craniotomy changes. Sinuses/Orbits: Negative Other: None ASPECTS ([place] Stroke Program Early CT Score) - Ganglionic level infarction (caudate, lentiform nuclei, internal capsule, insula, M1-M3 cortex): 7 - Supraganglionic infarction (M4-M6 cortex): 3 Total score (0-10 with 10 being normal): 10 IMPRESSION: 1. No sign of ischemic disease in this patient with a known left frontal glioblastoma, extensive left frontal vasogenic edema and widespread white matter changes subsequent radiation. No change since the previous studies of last month. 2. ASPECTS is 10. 3. These results were communicated to Dr. Leonel Ramsay at 7:34 pmon 7/20/2020by text page via the [facility] messaging system. Electronically Signed   By: Nelson Chimes M.D.   On: 02/02/2019 19:36    Procedures Procedures (including critical care time)  Medications Ordered in ED Medications  sodium chloride flush (NS) 0.9 % injection 3 mL (has no administration in time range)  iohexol (OMNIPAQUE) 350 MG/ML  injection 75 mL (75 mLs Intravenous Contrast Given 02/02/19 1931)     Initial Impression / Assessment and Plan / ED Course  I have reviewed the triage vital signs and the nursing notes.  Pertinent labs & imaging results that were available during my care of the patient were reviewed by me and considered in my medical decision making (see chart for details).  Clinical Course as of Feb 03 855  Mon Feb 02, 2019  2010 Patient seen by Dr. Leonel Ramsay.  Differential includes stroke, seizure, bleed.  His CT redemonstrates his AVM but does not show any acute bleed.  Dr. Leonel Ramsay is recommending a Keppra load along with some Ativan and admit for serial neuro checks.   [MB]    Clinical Course User Index [MB] Hayden Rasmussen, MD   Detrell Umscheid was evaluated in Emergency Department on 02/02/2019 for the symptoms described in the history of present illness. He was evaluated in the context of  the global COVID-19 pandemic, which necessitated consideration that the patient might be at risk for infection with the SARS-CoV-2 virus that causes COVID-19. Institutional protocols and algorithms that pertain to the evaluation of patients at risk for COVID-19 are in a state of rapid change based on information released by regulatory bodies including the CDC and federal and state organizations. These policies and algorithms were followed during the patient's care in the ED.      Final Clinical Impressions(s) / ED Diagnoses   Final diagnoses:  Right sided weakness  Benign neoplasm of brain, unspecified brain region [facility])    ED Discharge Orders    None       Hayden Rasmussen, MD 02/03/19 [phone]

## 2019-02-03 ENCOUNTER — Observation Stay (HOSPITAL_COMMUNITY): Payer: Medicare Other

## 2019-02-03 DIAGNOSIS — G8191 Hemiplegia, unspecified affecting right dominant side: Secondary | ICD-10-CM | POA: Diagnosis present

## 2019-02-03 DIAGNOSIS — Z79899 Other long term (current) drug therapy: Secondary | ICD-10-CM | POA: Diagnosis not present

## 2019-02-03 DIAGNOSIS — I2699 Other pulmonary embolism without acute cor pulmonale: Secondary | ICD-10-CM | POA: Diagnosis present

## 2019-02-03 DIAGNOSIS — R4701 Aphasia: Secondary | ICD-10-CM | POA: Diagnosis present

## 2019-02-03 DIAGNOSIS — R569 Unspecified convulsions: Secondary | ICD-10-CM | POA: Diagnosis not present

## 2019-02-03 DIAGNOSIS — G40409 Other generalized epilepsy and epileptic syndromes, not intractable, without status epilepticus: Secondary | ICD-10-CM | POA: Diagnosis present

## 2019-02-03 DIAGNOSIS — E46 Unspecified protein-calorie malnutrition: Secondary | ICD-10-CM | POA: Diagnosis not present

## 2019-02-03 DIAGNOSIS — Z7901 Long term (current) use of anticoagulants: Secondary | ICD-10-CM | POA: Diagnosis not present

## 2019-02-03 DIAGNOSIS — G40909 Epilepsy, unspecified, not intractable, without status epilepticus: Secondary | ICD-10-CM | POA: Diagnosis not present

## 2019-02-03 DIAGNOSIS — C711 Malignant neoplasm of frontal lobe: Secondary | ICD-10-CM | POA: Diagnosis not present

## 2019-02-03 DIAGNOSIS — R531 Weakness: Secondary | ICD-10-CM | POA: Diagnosis present

## 2019-02-03 DIAGNOSIS — G9341 Metabolic encephalopathy: Secondary | ICD-10-CM | POA: Diagnosis present

## 2019-02-03 DIAGNOSIS — Z823 Family history of stroke: Secondary | ICD-10-CM | POA: Diagnosis not present

## 2019-02-03 DIAGNOSIS — Z1159 Encounter for screening for other viral diseases: Secondary | ICD-10-CM | POA: Diagnosis not present

## 2019-02-03 DIAGNOSIS — C7250 Malignant neoplasm of unspecified cranial nerve: Secondary | ICD-10-CM | POA: Diagnosis present

## 2019-02-03 DIAGNOSIS — Z923 Personal history of irradiation: Secondary | ICD-10-CM | POA: Diagnosis not present

## 2019-02-03 DIAGNOSIS — Z85841 Personal history of malignant neoplasm of brain: Secondary | ICD-10-CM | POA: Diagnosis not present

## 2019-02-03 DIAGNOSIS — G8384 Todd's paralysis (postepileptic): Secondary | ICD-10-CM | POA: Diagnosis present

## 2019-02-03 DIAGNOSIS — D332 Benign neoplasm of brain, unspecified: Secondary | ICD-10-CM | POA: Diagnosis present

## 2019-02-03 DIAGNOSIS — R29810 Facial weakness: Secondary | ICD-10-CM | POA: Diagnosis present

## 2019-02-03 LAB — LIPID PANEL
Cholesterol: 164 mg/dL (ref 0–200)
HDL: 45 mg/dL (ref >40)
LDL Cholesterol: 108 mg/dL — ABNORMAL HIGH (ref 0–99)
Total CHOL/HDL Ratio: 3.6 RATIO
Triglycerides: 54 mg/dL (ref <150)
VLDL: 11 mg/dL (ref 0–40)

## 2019-02-03 LAB — HEMOGLOBIN A1C
Hgb A1c MFr Bld: 5.9 % — ABNORMAL HIGH (ref 4.8–5.6)
Mean Plasma Glucose: 122.63 mg/dL

## 2019-02-03 MED ORDER — SODIUM CHLORIDE 0.9 % IV SOLN
INTRAVENOUS | Status: DC | PRN
  Administered 2019-02-03: 250 mL via INTRAVENOUS

## 2019-02-03 MED ORDER — LACOSAMIDE 50 MG PO TABS
50.0000 mg | ORAL_TABLET | Freq: Two times a day (BID) | ORAL | Status: DC
  Administered 2019-02-03 – 2019-02-06 (×7): 50 mg via ORAL
  Filled 2019-02-03 (×7): qty 1

## 2019-02-03 NOTE — Progress Notes (Signed)
EEG Completed; Results Pending  

## 2019-02-03 NOTE — Progress Notes (Signed)
Rehab Admissions Coordinator Note:  Patient was screened by Michel Santee for appropriateness for an Inpatient Acute Rehab Consult.  Noted pt is observation status at this time. Pt may not have the medical necessity to warrant an inpatient rehab stay if they remain observation. If status were to change to inpatient, [facility] will screen for candidacy.    Michel Santee 02/03/2019, 10:20 AM  I can be reached at [phone].

## 2019-02-03 NOTE — Progress Notes (Addendum)
NEUROLOGY PROGRESS NOTE  Subjective: Patient is not in any significant pain.  He is able to follow commands however there is a delay.  He states that he is still weak on the right side.  He does not recall any of the event.  Continues to be a phasic  Exam: Vitals:   02/03/19 0435 02/03/19 0844  BP: 134/73 130/73  Pulse: 64 79  Resp: 17 20  Temp: 98.1 F (36.7 C) 98.4 F (36.9 C)  SpO2: 98% 96%    Physical Exam   HEENT-  Normocephalic, no lesions, without obvious abnormality.  Normal external eye and conjunctiva.   Extremities- Warm, dry and intact Musculoskeletal-no joint tenderness, deformity or swelling Skin-warm and dry, no hyperpigmentation, vitiligo, or suspicious lesions    Neuro:  Mental Status: Alert, is a phasic but able to follow simple commands with slow response and at times unable to understand what is asked of him. Cranial Nerves: II: Visual fields intact bilaterally III,IV, VI: ptosis not present, extra-ocular motions intact bilaterally pupils equal, round, reactive to light and accommodation V,VII: smile symmetric with slight right facial droop, facial light touch sensation normal bilaterally VIII: hearing normal bilaterally  Motor: Right : Upper extremity   4/5    Left:     Upper extremity   5/5  Lower extremity   4/5     Lower extremity   5/5 Tone and bulk:normal tone throughout; no atrophy noted Sensory: Pinprick and light touch intact throughout, bilaterally Deep Tendon Reflexes: 2+ and symmetric throughout Plantars: Right: downgoing   Left: downgoing Cerebellar: Finger-nose shows no dysmetria     Medications:  Prior to Admission:  Medications Prior to Admission  Medication Sig Dispense Refill Last Dose  . apixaban (ELIQUIS) 5 MG TABS tablet Take 10 mg BID till 12/24/2017, start taking 5 mg BID from 12/25/2017 (Patient taking differently: Take 5 mg by mouth 2 (two) times daily. ) 60 tablet 0 02/02/2019 at 1000  . levETIRAcetam (KEPPRA) 250 MG  tablet Take 5 tablets (1,250 mg total) by mouth 2 (two) times daily. 300 tablet 2 02/02/2019 at Unknown time  . Multiple Vitamin (MULTIVITAMIN) tablet Take 1 tablet by mouth daily.   02/02/2019 at Unknown time  . vitamin B-12 (CYANOCOBALAMIN) 500 MCG tablet Take 500 mcg by mouth daily.   02/02/2019 at Unknown time   Scheduled: . apixaban  5 mg Oral BID  . multivitamin with minerals  1 tablet Oral Daily  . sodium chloride flush  3 mL Intravenous Once  . vitamin B-12  500 mcg Oral Daily  Keppra 1500 mg twice daily  Pertinent Labs/Diagnostics: A1C 5.9  Ct Angio Head W Or Wo Contrast Ct Angio Neck W Or Wo Contrast  IMPRESSION: No acute vascular finding. No change since 1 month ago. Normal except for minimal nonstenotic atherosclerotic change at the bifurcation regions and siphons and some atherosclerotic narrowing and irregularity of the PCA branches. These results were communicated to Dr. Leonel Ramsay at 7:48 pmon 7/20/2020by text page via the [facility] messaging system. Electronically Signed   By: Nelson Chimes M.D.   On: 02/02/2019 19:50   Mr Brain Wo Contrast  Result Date: 02/03/2019  IMPRESSION: 1. Unchanged appearance of large amount of hyperintense T2-weighted signal centered in the left frontal lobe, which may indicate a combination of edema and/or nonenhancing tumor. 2. Comparison otherwise limited by lack of intravenous contrast material on the current study. 3. No acute ischemia. Electronically Signed   By: Ulyses Jarred M.D.   On:  02/03/2019 03:01   Ct Head Code Stroke Wo Contrast  Result Date: 02/02/2019 CLINICAL DATA:  Code stroke. Symptoms of right arm weakness and slurred speech. Glioblastoma. EXAM: CT HEAD WITHOUT CONTRAST TECHNIQUE IMPRESSION: 1. No sign of ischemic disease in this patient with a known left frontal glioblastoma, extensive left frontal vasogenic edema and widespread white matter changes subsequent radiation. No change since the previous studies of last month. 2. ASPECTS  is 10. 3. These results were communicated to Dr. Leonel Ramsay at 7:34 pmon 7/20/2020by text page via the [facility] messaging system. Electronically Signed   By: Nelson Chimes M.D.   On: 02/02/2019 19:36    This study showed cortical dysfunction in left hemisphere, maximal left fronto-central region which is consistent with the prior history of craniotomy  No seizures or clear epileptiform discharges were seen throughout the recording.  Etta Quill PA-C [facility] [phone]   Assessment: 78 year old male presenting with right-sided weakness and aphasia in the setting of a glioblastoma.  His symptoms appear to be improving as he is able to follow commands.  MRI obtained which showed  appearance of large amount of hyperintense T2 weighted signal centered in the left frontal lobe, which may indicate a combination of edema and or nonenhancing tumor unchanged from previous study.  No further seizures noted.    Impression:  -Glioblastoma - Focal seizure-improving-no further seizures noted - Todd's paresis   Recommendations: -Continue 1500 mg twice daily of Keppra - Follow EEG -Neurology will follow --discussed with Dr. Mickeal Skinner and will add Vimpat 50 BID PO --PT/OT evaluation   02/03/2019, 10:18 AM  NEUROHOSPITALIST ADDENDUM Performed a face to face diagnostic evaluation.   I have reviewed the contents of history and physical exam as documented by PA/ARNP/Resident and agree with above documentation.  I have discussed and formulated the above plan as documented. Edits to the note have been made as needed.  78 year old patient with frontal glioblastoma presents with focal seizures and right hemiparesis that is now improving.  EEG shows left hemispheric slowing, no epileptiform discharges.  Discussed with Dr. Maryan Char recommended addition of 50 mg twice daily of Vimpat.  Recommend PT OT evaluation patient can be discharged with outpatient follow-up with Dr. Mickeal Skinner in 1 to 2 weeks.    Per [facility] statutes, patients with seizures are not allowed to drive until they have been seizure-free for six months. Use caution when using heavy equipment or power tools. Avoid working on ladders or at heights. Take showers instead of baths. Ensure the water temperature is not too high on the home water heater. Do not go swimming alone. Do not lock yourself in a room alone (i.e. bathroom). When caring for infants or small children, sit down when holding, feeding, or changing them to minimize risk of injury to the child in the event you have a seizure. Maintain good sleep hygiene. Avoid alcohol.    If Ziad Maye has another seizure, call 911 and bring them back to the ED if:       A.  The seizure lasts longer than 5 minutes.            B.  The patient doesn't wake shortly after the seizure or has new problems such as difficulty seeing, speaking or moving following the seizure       C.  The patient was injured during the seizure       D.  The patient has a temperature over 102 F (39C)  E.  The patient vomited during the seizure and now is having trouble breathing    Karena Addison  MD [facility] [phone]   If 7pm to 7am, please call on call as listed on AMION.

## 2019-02-03 NOTE — Evaluation (Addendum)
Speech Language Pathology Evaluation Patient Details Name: Christopher Morris MRN: [id number] DOB: July 31, 1940 Today's Date: 02/03/2019 Time: 0102-7253 SLP Time Calculation (min) (ACUTE ONLY): 26 min  Problem List:  Patient Active Problem List   Diagnosis Date Noted  . Right sided weakness 02/02/2019  . Acute metabolic encephalopathy 66/44/0347  . Aphasia 12/29/2018  . Pneumonia 12/22/2017  . HCAP (healthcare-associated pneumonia) 12/22/2017  . Pulmonary embolism ([place]) 12/22/2017  . Tachycardia   . Vitamin B12 deficiency 11/14/2017  . Anemia 11/14/2017  . Fever 11/13/2017  . Dehydration 11/13/2017  . Interstitial pneumonia ([place]) 11/11/2017  . Sepsis ([place]) 11/09/2017  . Bacteremia due to Gram-negative bacteria 10/26/2017  . Sepsis due to Escherichia coli (E. coli) ([place]) 10/26/2017  . UTI (urinary tract infection) 10/25/2017  . Seizure ([place]) 09/25/2017  . Frontal glioblastoma multiforme ([place]) 09/25/2017  . Hyperglycemia 09/25/2017  . BLOOD IN STOOL 07/26/2008  . ERECTILE DYSFUNCTION 07/12/2008   Past Medical History:  Past Medical History:  Diagnosis Date  . Brain cancer ([place])   . Glioblastoma ([place]) 09/25/2017  . Hyperglycemia 09/25/2017  . Seizure ([place]) 09/25/2017   Past Surgical History:  Past Surgical History:  Procedure Laterality Date  . APPLICATION OF CRANIAL NAVIGATION N/A 09/30/2017   Procedure: APPLICATION OF CRANIAL NAVIGATION;  Surgeon: [name], MD;  Location: [facility];  Service: Neurosurgery;  Laterality: N/A;  . CRANIOTOMY N/A 09/30/2017   Procedure: CRANIOTOMY FOR RESECTION OF BRAIN TUMOR WITH STEREOTACTIC;  Surgeon: [name], MD;  Location: [facility];  Service: Neurosurgery;  Laterality: N/A;  CRANIOTOMY FOR RESECTION OF BRAIN TUMOR WITH STEREOTACTIC   HPI:  Pt is an 78 y.o. male with medical history significant of brain glioblastoma (s/p of surgical resection, radiation and chemotherapy), grand mal seizure, PE on Eliquis, who presented with right-sided  weakness. MRI of the brain was negative for acute changes but showed unchanged appearance of large amount of hyperintense T2-weighted signal centered in the left frontal lobe, which may indicate a combination of edema and/or nonenhancing tumor.   Assessment / Plan / Recommendation Clinical Impression  Pt reported that he was living with his daughter prior to admission and that she managed his medications and finances. He is known to speech pathology and participated in a speech-language evaluation on 12/29/18 which revealed mild-moderate aphasia and apraxia. During today's evaluation he demonstrated mild aphasia characterized by word retrieval difficulty during conversation, difficulty following three-step commands, and the need for additional processing time to follow two-step commands and comprehend complex paragraph-level information. With the pt's permission, pt's daughter was contacted to determine the pt's baseline. She indicated that he does need additional processing time for more complex information and needs steps to be provided individually for him to be successful. She also expressed that the pt still have word retrieval difficulty, that his sentences are typically shorter, and he does need additional time for sentence formulation. Based on her reports it was agreed that the pt's language skills are currently at baseline. Further acute skilled SLP services are therefore not clinically indicated at this time.     SLP Assessment  SLP Recommendation/Assessment: Patient does not need any further Speech Lanaguage Pathology Services SLP Visit Diagnosis: Aphasia (R47.01)    Follow Up Recommendations  None    Frequency and Duration           SLP Evaluation Cognition  Overall Cognitive Status: Difficult to assess(Due to noted receptive language impairments) Arousal/Alertness: Awake/alert Orientation Level: Oriented X4 Attention: Focused;Sustained Focused Attention: Appears intact Sustained  Attention: Appears  intact       Comprehension  Auditory Comprehension Overall Auditory Comprehension: Impaired at baseline Yes/No Questions: Impaired Basic Biographical Questions: (5/5) Complex Questions: (4/5) Paragraph Comprehension (via yes/no questions): (3/4) Commands: Impaired One Step Basic Commands: (5/5) Two Step Basic Commands: (2/4) Multistep Basic Commands: (0/4) Reading Comprehension Reading Status: Impaired Word level: Within functional limits Sentence Level: Impaired Paragraph Level: Not tested Functional Environmental (signs, name badge): Within functional limits    Expression Expression Primary Mode of Expression: Verbal Verbal Expression Overall Verbal Expression: Impaired at baseline Initiation: No impairment Automatic Speech: Counting;Month of year;Day of week(WNL) Repetition: No impairment Naming: No impairment Responsive: (4/5) Confrontation: Within functional limits(10/10) Convergent: (Sentence completion: 4/5) Pragmatics: No impairment Written Expression Dominant Hand: Right   Oral / Motor  Oral Motor/Sensory Function Overall Oral Motor/Sensory Function: Within functional limits Motor Speech Overall Motor Speech: Appears within functional limits for tasks assessed Respiration: Within functional limits Phonation: Normal Resonance: Within functional limits Articulation: Within functional limitis Intelligibility: Intelligible Motor Planning: Witnin functional limits Motor Speech Errors: Not applicable;Consistent   Lauriana Denes I. Hardin Negus, [place], [place] Office number [phone] Pager [place] 02/03/2019, 2:13 PM

## 2019-02-03 NOTE — Procedures (Addendum)
Patient Name: Christopher Morris MRN: [id number] Epilepsy Attending: Lora Havens, MD Referring Physician/Provider: Dr Eulogio Bear Date: 02/03/2019 Duration: 24.07  Patient history: 78 y.o. male with medical history significant of left frontal glioblastoma (s/p of surgical resection, radiation and chemotherapy), grand mal seizure, PE on Eliquis, who presents with right-sided weakness.   Per her daughter (I spoke with her daughter on the phone), pt was noted to have right-sided weakness and difficulty speaking at about 18:45. She also describes that the right side of his face seemed to "squinch up". No clear seizure activity noted.   Level of alertness: Awake and drowsy  AEDs during EEG study:   Technical aspects: This EEG study was done with scalp electrodes positioned according to the 10-20 International system of electrode placement. Electrical activity was reviewed with a high frequency filter of 70Hz  and a low frequency filter of 1Hz . EEG data were recorded continuously and digitally stored.   BACKGROUND ACTIVITY: Posterior dominant rhythm: The posterior dominant rhythm consists of 9-10 Hz activity of moderate voltage (25-35 uV) seen predominantly in posterior head regions, symmetric and reactive to eye opening and eye closing.          Slowing: There is continuous slowing seen on the left side, maximal left fronto-central region.   EPILEPTIFORM ACTIVITY: Interictal epileptiform activity: Sharp transients in left fronto-central region secondary to breech  Ictal Activity: None  OTHER EVENTS: None  SLEEP RECORDINGS:  Only awake and drowsy states were recorded. Drowsiness was characterized by attenuation of the posterior background rhythm.  ACTIVATION PROCEDURES:  Hyperventilation and photic stimulation were not performed.  IMPRESSION: This study showed cortical dysfunction in left hemisphere, maximal left fronto-central region which is consistent with the prior history of  craniotomy  No seizures or clear epileptiform discharges were seen throughout the recording.   Zeb Comfort Epilepsy

## 2019-02-03 NOTE — Progress Notes (Signed)
Occupational Therapy Treatment Patient Details Name: Christopher Morris MRN: [id number] DOB: 12/04/1940 Today's Date: 02/03/2019    History of present illness Patient is a 78 y/o male presenting to the ED on 02/02/2019 with primary complaints of R sided weakness and aphasia. PAst medical history significant of barin glioblastoma (s/p of surgical resection, radiation and chemotherapy), grand mal seizure, PE on Eliquis. CT-head: No sign of ischemic disease. Continued work-up.    OT comments  Patient presenting with decreased I in self care, functional mobility/transfers, endurance, strength, R inattention, balance, and safety awareness.  Per pt's chart he was mod I living with family and use of AE as needed  PTA. Family performs IADL tasks.  Patient currently functioning at mod - total A for self care and +2 for functional transfers. Patient will benefit from acute OT to increase overall independence in the areas of ADLs, functional mobility, and safety awareness in order to safely discharge to next venue of care.   Follow Up Recommendations  CIR;Supervision/Assistance - 24 hour    Equipment Recommendations  Other (comment)(defer to next venue of care)    Recommendations for Other Services Rehab consult    Precautions / Restrictions Precautions Precautions: Fall Precaution Comments: R inattention/ apraxia Restrictions Weight Bearing Restrictions: No       Mobility Bed Mobility Overal bed mobility: Needs Assistance Bed Mobility: Supine to Sit;Sit to Supine     Supine to sit: Mod assist;+2 for physical assistance Sit to supine: Mod assist;+2 for physical assistance   General bed mobility comments: Mod A to compete transfer; increased time needed with required use of bed rails; assist for guiding R LE   Transfers Overall transfer level: Needs assistance Equipment used: Rolling walker (2 wheeled) Transfers: Sit to/from [facility] Sit to Stand: Mod assist;+2 physical  assistance Stand pivot transfers: Mod assist;+2 physical assistance       General transfer comment: Mod A +2 to come into standing at bedside and from recliner. Noted psoterior bias. MOd A +2 for pivot transfer bed <> recliner with increased cueing for weight shift and LE advancement     Balance Overall balance assessment: Needs assistance Sitting-balance support: Bilateral upper extremity supported;Feet supported Sitting balance-Leahy Scale: Poor   Postural control: Posterior lean;Right lateral lean Standing balance support: Bilateral upper extremity supported;During functional activity Standing balance-Leahy Scale: Poor Standing balance comment: required Mod A +2 for balance and use of RW          ADL either performed or assessed with clinical judgement   ADL Overall ADL's : Needs assistance/impaired             Lower Body Bathing: Cueing for sequencing;Sit to/from stand;Cueing for safety;Sitting/lateral leans;Moderate assistance;Maximal assistance   Upper Body Dressing : Moderate assistance;Cueing for safety;Cueing for sequencing   Lower Body Dressing: Maximal assistance;Sit to/from stand Lower Body Dressing Details (indicate cue type and reason): donning socks seated EOB in figure four position with cuing to utilized B UEs for task         Vision Baseline Vision/History: No visual deficits Patient Visual Report: No change from baseline Vision Assessment?: Vision impaired- to be further tested in functional context Additional Comments: Pt appears to be tracking when asked to locate/find something on R side but does not attend unless cued to do so          Cognition Arousal/Alertness: Awake/alert Behavior During Therapy: WFL for tasks assessed/performed Overall Cognitive Status: No family/caregiver present to determine baseline cognitive functioning Area of Impairment: Orientation;Following  commands;Safety/judgement;Problem solving                  Orientation Level: Disoriented to;Time;Situation Current Attention Level: Sustained   Following Commands: Follows one step commands with increased time;Follows one step commands inconsistently Safety/Judgement: Decreased awareness of safety;Decreased awareness of deficits Awareness: Intellectual Problem Solving: Difficulty sequencing;Requires verbal cues;Requires tactile cues;Slow processing General Comments: unable to state name, but does agree to name when PT states it, otherwise only able to state that he is at [facility]; increased time for processing \. When given choice of two he does not consistently say correct answer                   Pertinent Vitals/ Pain       Pain Assessment: Faces Faces Pain Scale: No hurt  Home Living Family/patient expects to be discharged to:: Private residence Living Arrangements: Children Available Help at Discharge: Family;Available PRN/intermittently Type of Home: House Home Access: Stairs to enter [employer] of Steps: 1   Home Layout: One level     Bathroom Shower/Tub: Occupational psychologist: Standard     Home Equipment: Environmental consultant - 4 wheels;Shower seat   Additional Comments: patient is a poor historian, home environment taken form most recent admission  Lives With: Daughter    Prior Functioning/Environment Level of Independence: Needs assistance  Gait / Transfers Assistance Needed: Uses rollator for functional mobility ADL's / Homemaking Assistance Needed: reports that he does ADLs with shower seat, children help with IADL       Frequency  Min 2X/week        Progress Toward Goals  OT Goals(current goals can now be found in the care plan section)     Acute Rehab OT Goals Patient Stated Goal: none stated OT Goal Formulation: Patient unable to participate in goal setting ADL Goals Pt Will Perform Grooming: with supervision Pt Will Perform Upper Body Bathing: with supervision Pt Will Perform Lower  Body Bathing: with min assist Pt Will Perform Upper Body Dressing: with supervision Pt Will Perform Lower Body Dressing: with min assist Pt Will Transfer to Toilet: with min assist Pt Will Perform Toileting - Clothing Manipulation and hygiene: with min assist  Plan      Co-evaluation    PT/OT/SLP Co-Evaluation/Treatment: Yes Reason for Co-Treatment: Complexity of the patient's impairments (multi-system involvement);Necessary to address cognition/behavior during functional activity;For patient/therapist safety;To address functional/ADL transfers PT goals addressed during session: Mobility/safety with mobility;Balance;Proper use of DME OT goals addressed during session: ADL's and self-care;Proper use of Adaptive equipment and DME      AM-PAC OT "6 Clicks" Daily Activity     Outcome Measure   Help from another person eating meals?: A Lot Help from another person taking care of personal grooming?: A Lot Help from another person toileting, which includes using toliet, bedpan, or urinal?: Total Help from another person bathing (including washing, rinsing, drying)?: A Lot Help from another person to put on and taking off regular upper body clothing?: A Lot Help from another person to put on and taking off regular lower body clothing?: Total 6 Click Score: 10    End of Session Equipment Utilized During Treatment: Gait belt;Rolling walker  OT Visit Diagnosis: Unsteadiness on feet (R26.81);Other abnormalities of gait and mobility (R26.89);Other symptoms and signs involving cognitive function;Other (comment)   Activity Tolerance Patient tolerated treatment well   Patient Left with call bell/phone within reach;with bed alarm set;in bed   Nurse Communication Mobility status;Precautions  Time: 8850-2774 OT Time Calculation (min): 22 min  Charges: OT General Charges $OT Visit: 1 Visit OT Evaluation $OT Eval Moderate Complexity: 1 Mod  Parker Sawatzky P, MS, OTR/L 02/03/2019,  12:39 PM

## 2019-02-03 NOTE — Evaluation (Signed)
Physical Therapy Evaluation Patient Details Name: Christopher Morris MRN: [id number] DOB: Jan 05, 1941 Today's Date: 02/03/2019   History of Present Illness  Patient is a 78 y/o male presenting to the ED on 02/02/2019 with primary complaints of R sided weakness and aphasia. PAst medical history significant of barin glioblastoma (s/p of surgical resection, radiation and chemotherapy), grand mal seizure, PE on Eliquis. CT-head: No sign of ischemic disease. Continued work-up.     Clinical Impression  Patient presenting with the above. Patient a poor historian with PLOF and home environment gleaned from most recent admission. Patient today requiring assistance for bed level mobility and OOB mobility with Mod A +2 and use of RW for increased stability. Noted posterior lean in standing even with max cueing. Cueing required for weight shifting and LE advancement to complete transfer. Due to current functional status will recommend CIR level therapies at discharge. PT to follow acutely.      Follow Up Recommendations CIR    Equipment Recommendations  None recommended by PT    Recommendations for Other Services Rehab consult     Precautions / Restrictions Precautions Precautions: Fall Precaution Comments: R inattention/ apraxia Restrictions Weight Bearing Restrictions: No      Mobility  Bed Mobility Overal bed mobility: Needs Assistance Bed Mobility: Supine to Sit;Sit to Supine     Supine to sit: Mod assist;+2 for physical assistance Sit to supine: Mod assist;+2 for physical assistance   General bed mobility comments: Mod A to compete transfer; increased time needed with required use of bed rails; assist for guiding R LE   Transfers Overall transfer level: Needs assistance Equipment used: Rolling walker (2 wheeled) Transfers: Sit to/from [facility] Sit to Stand: Mod assist;+2 physical assistance Stand pivot transfers: Mod assist;+2 physical assistance       General  transfer comment: Mod A +2 to come into standing at bedside and from recliner. Noted psoterior bias. MOd A +2 for pivot transfer bed <> recliner with increased cueing for weight shift and LE advancement   Ambulation/Gait             General Gait Details: deferred  Stairs            Wheelchair Mobility    Modified Rankin (Stroke Patients Only) Modified Rankin (Stroke Patients Only) Pre-Morbid Rankin Score: Moderately severe disability Modified Rankin: Moderately severe disability     Balance Overall balance assessment: Needs assistance Sitting-balance support: Bilateral upper extremity supported;Feet supported Sitting balance-Leahy Scale: Poor   Postural control: Posterior lean;Right lateral lean Standing balance support: Bilateral upper extremity supported;During functional activity Standing balance-Leahy Scale: Poor Standing balance comment: required Mod A +2 for balance and use of RW                             Pertinent Vitals/Pain Pain Assessment: No/denies pain    Home Living Family/patient expects to be discharged to:: Private residence Living Arrangements: Children Available Help at Discharge: Family;Available PRN/intermittently Type of Home: House Home Access: Stairs to enter   Entrance Stairs-Number of Steps: 1 Home Layout: One level Home Equipment: Walker - 4 wheels;Shower seat Additional Comments: patient is a poor historian, home environment taken form most recent admission    Prior Function Level of Independence: Needs assistance   Gait / Transfers Assistance Needed: Uses rollator for functional mobility           Hand Dominance        Extremity/Trunk Assessment  Upper Extremity Assessment Upper Extremity Assessment: Defer to OT evaluation    Lower Extremity Assessment Lower Extremity Assessment: Generalized weakness;RLE deficits/detail RLE Deficits / Details: decreased weight shift to this LE in standing; inattention  to R LE     Cervical / Trunk Assessment Cervical / Trunk Assessment: Kyphotic  Communication   Communication: Expressive difficulties  Cognition Arousal/Alertness: Awake/alert Behavior During Therapy: WFL for tasks assessed/performed Overall Cognitive Status: No family/caregiver present to determine baseline cognitive functioning Area of Impairment: Orientation;Following commands;Safety/judgement;Problem solving                 Orientation Level: Disoriented to;Time;Situation     Following Commands: Follows one step commands with increased time;Follows one step commands inconsistently Safety/Judgement: Decreased awareness of safety;Decreased awareness of deficits   Problem Solving: Difficulty sequencing;Requires verbal cues;Requires tactile cues;Slow processing General Comments: unable to state name, but does agree to name when PT states it, otherwise only able to state that he is at [facility]; increased time for processing       General Comments      Exercises     Assessment/Plan    PT Assessment Patient needs continued PT services  PT Problem List Decreased strength;Decreased activity tolerance;Decreased balance;Decreased mobility;Decreased knowledge of use of DME;Decreased safety awareness       PT Treatment Interventions DME instruction;Gait training;Stair training;Functional mobility training;Therapeutic activities;Balance training;Therapeutic exercise;Neuromuscular re-education;Patient/family education    PT Goals (Current goals can be found in the Care Plan section)  Acute Rehab PT Goals Patient Stated Goal: none stated PT Goal Formulation: Patient unable to participate in goal setting Time For Goal Achievement: 02/17/19 Potential to Achieve Goals: Fair    Frequency Min 4X/week   Barriers to discharge        Co-evaluation PT/OT/SLP Co-Evaluation/Treatment: Yes Reason for Co-Treatment: Complexity of the patient's impairments (multi-system  involvement);For patient/therapist safety;Necessary to address cognition/behavior during functional activity;To address functional/ADL transfers PT goals addressed during session: Mobility/safety with mobility;Balance;Proper use of DME         AM-PAC PT "6 Clicks" Mobility  Outcome Measure Help needed turning from your back to your side while in a flat bed without using bedrails?: A Little Help needed moving from lying on your back to sitting on the side of a flat bed without using bedrails?: A Lot Help needed moving to and from a bed to a chair (including a wheelchair)?: A Lot Help needed standing up from a chair using your arms (e.g., wheelchair or bedside chair)?: A Lot Help needed to walk in hospital room?: A Lot Help needed climbing 3-5 steps with a railing? : Total 6 Click Score: 12    End of Session Equipment Utilized During Treatment: Gait belt Activity Tolerance: Patient tolerated treatment well Patient left: in bed;with call bell/phone within reach;with bed alarm set Nurse Communication: Mobility status PT Visit Diagnosis: Unsteadiness on feet (R26.81);Other abnormalities of gait and mobility (R26.89);Muscle weakness (generalized) (M62.81)    Time: 5916-3846 PT Time Calculation (min) (ACUTE ONLY): 22 min   Charges:   PT Evaluation $PT Eval Moderate Complexity: 1 Mod           Lanney Gins, PT, DPT Supplemental Physical Therapist 02/03/19 9:43 AM Pager: [phone] Office: [phone]

## 2019-02-03 NOTE — Progress Notes (Signed)
SLP Cancellation Note  Patient Details Name: Christopher Morris MRN: [id number] DOB: 1941/06/06   Cancelled treatment:       Reason Eval/Treat Not Completed: Patient at procedure or test/unavailable(Pt receivcing care from nurse tech. SLP will follow up. )  Larene Ascencio I. Hardin Negus, [place], [place] Office number [phone] Pager [place] 02/03/2019, 9:52 AM

## 2019-02-03 NOTE — Progress Notes (Addendum)
Progress Note    Christopher Morris  XNT:[id number] DOB: 26-Jul-1940  DOA: 02/02/2019 PCP: [facility]    Brief Narrative:     Medical records reviewed and are as summarized below:  Christopher Morris is an 78 y.o. male with medical history significant of barin glioblastoma (s/p of surgical resection, radiation and chemotherapy), grand mal seizure, PE on Eliquis, who presents with right-sided weakness.  Assessment/Plan:   Principal Problem:   Right sided weakness Active Problems:   Seizure (HCC)   Frontal glioblastoma multiforme (HCC)   Pulmonary embolism (HCC)   Acute metabolic encephalopathy  Right sided weakness, aphasiaand acute metabolic encephalopathy: -Etiology is not clear, differential diagnosis includes seizure versus stroke.  -CT-head and CTA of head and neck are negative for acute issues.   -HQP:RFFMBWGYK appearance of large amount of hyperintense T2-weighted signal centered in the left frontal lobe, which may indicate a combination of edema and/or nonenhancing tumor.  -keppra increased to 1500mg  BID -EEG pending  Seizure -Seizure precaution -When necessary Ativan for seizure -on IV keppra  Frontal glioblastoma multiforme [facility]): s/p ofcraniotomy resection, chemo and radiation therapy. Pt follows up with Dr. Mickeal Skinner  Pulmonary embolism [facility]): -continue eliquis  Will change to inpatient due to persistent mental status change (not back to baseline cognitively per daughter), adjusting AEDs and monitoring for recurrent seizures in the context of brain tumor.  He is therefore unsafe to discharge.   Family Communication/Anticipated D/C date and plan/Code Status   DVT prophylaxis: eliquis Code Status: Full Code.  Family Communication: spoke with daughter on phone Disposition Plan: pending improvement/appropriate dispo   Medical Consultants:    Neurology     Subjective:   No complaints  Objective:    Vitals:   02/03/19 0211  02/03/19 0435 02/03/19 0844 02/03/19 1133  BP: 134/72 134/73 130/73 126/69  Pulse: 72 64 79 73  Resp: 18 17 20 20   Temp: 97.9 F (36.6 C) 98.1 F (36.7 C) 98.4 F (36.9 C) 98.1 F (36.7 C)  TempSrc: Oral Oral Oral Oral  SpO2: 99% 98% 96% 98%  Weight:      Height:        Intake/Output Summary (Last 24 hours) at 02/03/2019 1135 Last data filed at 02/03/2019 5993 Gross per 24 hour  Intake 372.8 ml  Output 1150 ml  Net -777.2 ml   Filed Weights   02/02/19 2235  Weight: 97.3 kg    Exam: Eating breakfast Alert, pleasant, slow to respond at times rrr No increased work of breathing  Data Reviewed:   I have personally reviewed following labs and imaging studies:  Labs: Labs show the following:   Basic Metabolic Panel: Recent Labs  Lab 02/02/19 1925 02/02/19 1931  NA 140 140  K 3.7 3.7  CL 107 104  CO2 24  --   GLUCOSE 113* 108*  BUN 9 9  CREATININE 0.89 0.80  CALCIUM 9.4  --    GFR Estimated Creatinine Clearance: 87.6 mL/min (by C-G formula based on SCr of 0.8 mg/dL). Liver Function Tests: Recent Labs  Lab 02/02/19 1925  AST 18  ALT 12  ALKPHOS 60  BILITOT 0.9  PROT 6.3*  ALBUMIN 4.2   No results for input(s): LIPASE, AMYLASE in the last 168 hours. No results for input(s): AMMONIA in the last 168 hours. Coagulation profile Recent Labs  Lab 02/02/19 1925  INR 1.2    CBC: Recent Labs  Lab 02/02/19 1925 02/02/19 1931  WBC 7.2  --   NEUTROABS 3.3  --  HGB 13.2 13.3  HCT 39.5 39.0  MCV 89.8  --   PLT 169  --    Cardiac Enzymes: No results for input(s): CKTOTAL, CKMB, CKMBINDEX, TROPONINI in the last 168 hours. BNP (last 3 results) No results for input(s): PROBNP in the last 8760 hours. CBG: No results for input(s): GLUCAP in the last 168 hours. D-Dimer: No results for input(s): DDIMER in the last 72 hours. Hgb A1c: Recent Labs    02/03/19 0403  HGBA1C 5.9*   Lipid Profile: Recent Labs    02/03/19 0403  CHOL 164  HDL 45   LDLCALC 108*  TRIG 54  CHOLHDL 3.6   Thyroid function studies: No results for input(s): TSH, T4TOTAL, T3FREE, THYROIDAB in the last 72 hours.  Invalid input(s): FREET3 Anemia work up: No results for input(s): VITAMINB12, FOLATE, FERRITIN, TIBC, IRON, RETICCTPCT in the last 72 hours. Sepsis Labs: Recent Labs  Lab 02/02/19 1925  WBC 7.2    Microbiology Recent Results (from the past 240 hour(s))  SARS Coronavirus 2 (CEPHEID - Performed in [place] hospital lab), Hosp Order     Status: None   Collection Time: 02/02/19  9:40 PM   Specimen: Nasopharyngeal Swab  Result Value Ref Range Status   SARS Coronavirus 2 NEGATIVE NEGATIVE Final    Comment: (NOTE) If result is NEGATIVE SARS-CoV-2 target nucleic acids are NOT DETECTED. The SARS-CoV-2 RNA is generally detectable in upper and lower  respiratory specimens during the acute phase of infection. The lowest  concentration of SARS-CoV-2 viral copies this assay can detect is 250  copies / mL. A negative result does not preclude SARS-CoV-2 infection  and should not be used as the sole basis for treatment or other  patient management decisions.  A negative result may occur with  improper specimen collection / handling, submission of specimen other  than nasopharyngeal swab, presence of viral mutation(s) within the  areas targeted by this assay, and inadequate number of viral copies  (<250 copies / mL). A negative result must be combined with clinical  observations, patient history, and epidemiological information. If result is POSITIVE SARS-CoV-2 target nucleic acids are DETECTED. The SARS-CoV-2 RNA is generally detectable in upper and lower  respiratory specimens dur ing the acute phase of infection.  Positive  results are indicative of active infection with SARS-CoV-2.  Clinical  correlation with patient history and other diagnostic information is  necessary to determine patient infection status.  Positive results do  not rule  out bacterial infection or co-infection with other viruses. If result is PRESUMPTIVE POSTIVE SARS-CoV-2 nucleic acids MAY BE PRESENT.   A presumptive positive result was obtained on the submitted specimen  and confirmed on repeat testing.  While 2019 novel coronavirus  (SARS-CoV-2) nucleic acids may be present in the submitted sample  additional confirmatory testing may be necessary for epidemiological  and / or clinical management purposes  to differentiate between  SARS-CoV-2 and other Sarbecovirus currently known to infect humans.  If clinically indicated additional testing with an alternate test  methodology [phone]) is advised. The SARS-CoV-2 RNA is generally  detectable in upper and lower respiratory sp ecimens during the acute  phase of infection. The expected result is Negative. Fact Sheet for Patients:  StrictlyIdeas.no Fact Sheet for Healthcare Providers: BankingDealers.co.za This test is not yet approved or cleared by the [place] FDA and has been authorized for detection and/or diagnosis of SARS-CoV-2 by FDA under an Emergency Use Authorization (EUA).  This EUA will remain in effect (  meaning this test can be used) for the duration of the COVID-19 declaration under Section 564(b)(1) of the Act, 21 U.S.C. section 360bbb-3(b)(1), unless the authorization is terminated or revoked sooner. Performed at [facility], [place] [address], [place], [place] [address]     Procedures and diagnostic studies:  Ct Angio Head W Or Wo Contrast  Result Date: 02/02/2019 CLINICAL DATA:  Acute presentation with right arm weakness and slurred speech EXAM: CT ANGIOGRAPHY HEAD AND NECK TECHNIQUE: Multidetector CT imaging of the head and neck was performed using the standard protocol during bolus administration of intravenous contrast. Multiplanar CT image reconstructions and MIPs were obtained to evaluate the vascular anatomy. Carotid  stenosis measurements (when applicable) are obtained utilizing NASCET criteria, using the distal internal carotid diameter as the denominator. CONTRAST:  31mL OMNIPAQUE IOHEXOL 350 MG/ML SOLN COMPARISON:  Head CT same day.  MRI and CT studies 12/29/2018 FINDINGS: CTA NECK FINDINGS Aortic arch: Aortic atherosclerosis. No aneurysm or dissection. Branching pattern is normal without origin stenosis. Right carotid system: Common carotid artery widely patent to the bifurcation. Carotid bifurcation shows minimal atherosclerotic disease but no stenosis or irregularity. Cervical ICA is tortuous but widely patent. Left carotid system: Left common carotid artery is widely patent to the bifurcation. Carotid bifurcation is widely patent. Cervical ICA is normal. Vertebral arteries: Both vertebral artery origins are widely patent. Both vertebral arteries are normal through the cervical region to the foramen magnum. Skeleton: Advanced chronic cervical spondylosis as seen previously. Other neck: No soft tissue mass or lymphadenopathy. Upper chest: Negative Review of the MIP images confirms the above findings CTA HEAD FINDINGS Anterior circulation: Both internal carotid arteries are patent through the skull base and siphon regions. Siphon atherosclerotic calcification but no stenosis. The anterior and middle cerebral vessels are patent without proximal stenosis, aneurysm or vascular malformation. No large or medium vessel occlusion. Posterior circulation: Both vertebral arteries are widely patent to the basilar. No basilar stenosis. Posterior circulation branch vessels are patent. Mild irregularity of both PCA vessels as shown previously. Venous sinuses: Patent and normal. Anatomic variants: None significant. Delayed phase: Not performed. Review of the MIP images confirms the above findings IMPRESSION: No acute vascular finding. No change since 1 month ago. Normal except for minimal nonstenotic atherosclerotic change at the  bifurcation regions and siphons and some atherosclerotic narrowing and irregularity of the PCA branches. These results were communicated to Dr. Leonel Ramsay at 7:48 pmon 7/20/2020by text page via the [facility] messaging system. Electronically Signed   By: Nelson Chimes M.D.   On: 02/02/2019 19:50   Ct Angio Neck W Or Wo Contrast  Result Date: 02/02/2019 CLINICAL DATA:  Acute presentation with right arm weakness and slurred speech EXAM: CT ANGIOGRAPHY HEAD AND NECK TECHNIQUE: Multidetector CT imaging of the head and neck was performed using the standard protocol during bolus administration of intravenous contrast. Multiplanar CT image reconstructions and MIPs were obtained to evaluate the vascular anatomy. Carotid stenosis measurements (when applicable) are obtained utilizing NASCET criteria, using the distal internal carotid diameter as the denominator. CONTRAST:  54mL OMNIPAQUE IOHEXOL 350 MG/ML SOLN COMPARISON:  Head CT same day.  MRI and CT studies 12/29/2018 FINDINGS: CTA NECK FINDINGS Aortic arch: Aortic atherosclerosis. No aneurysm or dissection. Branching pattern is normal without origin stenosis. Right carotid system: Common carotid artery widely patent to the bifurcation. Carotid bifurcation shows minimal atherosclerotic disease but no stenosis or irregularity. Cervical ICA is tortuous but widely patent. Left carotid system: Left common carotid artery is widely  patent to the bifurcation. Carotid bifurcation is widely patent. Cervical ICA is normal. Vertebral arteries: Both vertebral artery origins are widely patent. Both vertebral arteries are normal through the cervical region to the foramen magnum. Skeleton: Advanced chronic cervical spondylosis as seen previously. Other neck: No soft tissue mass or lymphadenopathy. Upper chest: Negative Review of the MIP images confirms the above findings CTA HEAD FINDINGS Anterior circulation: Both internal carotid arteries are patent through the skull base and siphon  regions. Siphon atherosclerotic calcification but no stenosis. The anterior and middle cerebral vessels are patent without proximal stenosis, aneurysm or vascular malformation. No large or medium vessel occlusion. Posterior circulation: Both vertebral arteries are widely patent to the basilar. No basilar stenosis. Posterior circulation branch vessels are patent. Mild irregularity of both PCA vessels as shown previously. Venous sinuses: Patent and normal. Anatomic variants: None significant. Delayed phase: Not performed. Review of the MIP images confirms the above findings IMPRESSION: No acute vascular finding. No change since 1 month ago. Normal except for minimal nonstenotic atherosclerotic change at the bifurcation regions and siphons and some atherosclerotic narrowing and irregularity of the PCA branches. These results were communicated to Dr. Leonel Ramsay at 7:48 pmon 7/20/2020by text page via the [facility] messaging system. Electronically Signed   By: Nelson Chimes M.D.   On: 02/02/2019 19:50   Mr Brain Wo Contrast  Result Date: 02/03/2019 EXAM: MRI HEAD WITHOUT CONTRAST TECHNIQUE: Multiplanar, multiecho pulse sequences of the brain and surrounding structures were obtained without intravenous contrast. COMPARISON:  None. FINDINGS: Brain: There is no acute ischemia. There is a large area of hyperintense T2-weighted signal centered within the left frontal lobe, unchanged from the prior study. This is superimposed on diffuse periventricular white matter hyperintensity. No contrast agent was administered on the current examination, limiting comparison to the prior study. There are areas of chronic hemorrhage at the left frontal lobe resection site. There is no midline shift or other mass effect. No acute intracranial hemorrhage. Vascular: The major intracranial arterial and venous sinus flow voids are preserved. Skull and upper cervical spine: Remote left pterional craniotomy. Sinuses/Orbits: No fluid levels or  advanced mucosal thickening of the visualized paranasal sinuses. No mastoid or middle ear effusion. The orbits are normal. Other: None IMPRESSION: 1. Unchanged appearance of large amount of hyperintense T2-weighted signal centered in the left frontal lobe, which may indicate a combination of edema and/or nonenhancing tumor. 2. Comparison otherwise limited by lack of intravenous contrast material on the current study. 3. No acute ischemia. Electronically Signed   By: Ulyses Jarred M.D.   On: 02/03/2019 03:01   Ct Head Code Stroke Wo Contrast  Result Date: 02/02/2019 CLINICAL DATA:  Code stroke. Symptoms of right arm weakness and slurred speech. Glioblastoma. EXAM: CT HEAD WITHOUT CONTRAST TECHNIQUE: Contiguous axial images were obtained from the base of the skull through the vertex without intravenous contrast. COMPARISON:  MRI 12/29/2018.  CT 12/29/2018 FINDINGS: Brain: Previous left frontal craniotomy. 3 cm left frontal mass consistent with glioblastoma difficult to appreciate by noncontrast CT. Extensive vasogenic edema throughout the left hemisphere as seen previously. White matter low density consistent with previous radiation change as seen previously. No sign of acute infarction, hemorrhage, hydrocephalus or extra-axial collection. No midline shift. Vascular: No abnormal vascular finding. Skull: Left frontal craniotomy changes. Sinuses/Orbits: Negative Other: None ASPECTS ([place] Stroke Program Early CT Score) - Ganglionic level infarction (caudate, lentiform nuclei, internal capsule, insula, M1-M3 cortex): 7 - Supraganglionic infarction (M4-M6 cortex): 3 Total score (0-10 with 10 being  normal): 10 IMPRESSION: 1. No sign of ischemic disease in this patient with a known left frontal glioblastoma, extensive left frontal vasogenic edema and widespread white matter changes subsequent radiation. No change since the previous studies of last month. 2. ASPECTS is 10. 3. These results were communicated to Dr.  Leonel Ramsay at 7:34 pmon 7/20/2020by text page via the [facility] messaging system. Electronically Signed   By: Nelson Chimes M.D.   On: 02/02/2019 19:36    Medications:    apixaban  5 mg Oral BID   multivitamin with minerals  1 tablet Oral Daily   sodium chloride flush  3 mL Intravenous Once   vitamin B-12  500 mcg Oral Daily   Continuous Infusions:  sodium chloride Stopped (02/03/19 0038)   levETIRAcetam Stopped (02/03/19 0029)     LOS: 0 days   Geradine Girt  [facility]   How to contact the [facility] Attending or Consulting provider [place] or covering provider during after hours [place], for this patient?  1. Check the care team in [facility] and look for a) attending/consulting [facility] provider listed and b) the [facility] team listed 2. Log into [email or web address] and use [place]'s universal password to access. If you do not have the password, please contact the hospital operator. 3. Locate the [facility] provider you are looking for under [facility] and page to a number that you can be directly reached. 4. If you still have difficulty reaching the provider, please page the [facility] (Director on Call) for the Hospitalists listed on amion for assistance.  02/03/2019, 11:35 AM

## 2019-02-04 MED ORDER — LEVETIRACETAM 750 MG PO TABS
1500.0000 mg | ORAL_TABLET | Freq: Two times a day (BID) | ORAL | Status: DC
  Administered 2019-02-04 – 2019-02-06 (×4): 1500 mg via ORAL
  Filled 2019-02-04 (×4): qty 2

## 2019-02-04 NOTE — Progress Notes (Signed)
PROGRESS NOTE    Christopher Morris  KXF:[id number] DOB: Dec 11, 1940 DOA: 02/02/2019 PCP: [facility]   Brief Narrative: Patient is a 78-year-old male with history of brain glioblastoma status post surgical resection, radiation and chemotherapy, seizure disorder, pulmonary embolism on Eliquis who presented with right-sided weakness.  Differential diagnosis include seizure versus stroke.  Neurology following.  MRI did not show a stroke.  Patient evaluated PT/OT and recommended CIR on discharge.   Assessment & Plan:   Principal Problem:   Right sided weakness Active Problems:   Seizure (HCC)   Frontal glioblastoma multiforme (HCC)   Pulmonary embolism (HCC)   Acute metabolic encephalopathy   Seizures (HCC)   Right-sided weakness/aphasia/acute metabolic encephalopathy: Etiology unclear at this point.  Differentials include seizure versus stroke.  CT head/CTA negative for acute intracranial abnormalities.  MRI showed unchanged hyperintense T2-weighted signal centered in the left frontal lobe: Possible edema/nonenhancing tumor.  Patient remains confused this morning.  Oriented to self. Seen by physical therapy/Occupational Therapy and recommended CIR.  CIR consulted.  He remains weak on the right side.  Speech might have improved today.  His speech is slow but clear. Neurology following.  Seizure disorder: Currently on IV Keppra and Vimpat.  Continue seizure precaution.  Neurology following.  EEG done which showed cortical dysfunction in the left hemisphere but no seizure or epileptiform discharge.  Frontal glioblastoma multiforme: Status post craniectomy resection, chemo and radiation.  Patient follows with Dr. Mickeal Skinner  Pulmonary embolism: Currently on Eliquis.  Debility/deconditioning: Patient seen by physical therapy and recommended CIR on discharge          DVT prophylaxis: Eliquis Code Status: Full Family Communication: My colleague discussed with the daughter on  phone yesterday.  Will call daughter  tomorrow Disposition Plan: CIR   Consultants:   Procedures:MRI,EEG  Antimicrobials:  Anti-infectives (From admission, onward)   None      Subjective:  Patient seen and examined the bedside this morning.  Hemodynamically stable.  Weak on the right side, speech might have improved.  He talks about his words are slow.  Alert but not oriented.  Oriented to self only.  He was working with physical therapy this morning.  Objective: Vitals:   02/03/19 1930 02/03/19 2314 02/04/19 0307 02/04/19 0801  BP: 135/76 138/80 134/80 121/72  Pulse: 71 63 64 67  Resp: 16 16 16 16   Temp: 98.3 F (36.8 C) 98.4 F (36.9 C) 98.6 F (37 C) 98.2 F (36.8 C)  TempSrc: Oral Oral Oral Oral  SpO2: 99% 98% 97% 99%  Weight:      Height:        Intake/Output Summary (Last 24 hours) at 02/04/2019 1049 Last data filed at 02/04/2019 6967 Gross per 24 hour  Intake --  Output 1100 ml  Net -1100 ml   Filed Weights   02/02/19 2235  Weight: 97.3 kg    Examination:  General exam: Appears calm and comfortable ,Not in distress,average built HEENT:PERRL,Oral mucosa moist, Ear/Nose normal on gross exam Respiratory system: Bilateral equal air entry, normal vesicular breath sounds, no wheezes or crackles  Cardiovascular system: S1 & S2 heard, RRR. No JVD, murmurs, rubs, gallops or clicks. No pedal edema. Gastrointestinal system: Abdomen is nondistended, soft and nontender. No organomegaly or masses felt. Normal bowel sounds heard. Central nervous system: Alert and awake.oriented to self.  Weakness on the right side  extremities: No edema, no clubbing ,no cyanosis, distal peripheral pulses palpable. Skin: No rashes, lesions or ulcers,no icterus ,no pallor MSK: Normal  muscle bulk,tone ,power Psychiatry: Judgement and insight appear normal. Mood & affect appropriate.     Data Reviewed: I have personally reviewed following labs and imaging studies  CBC: Recent Labs   Lab 02/02/19 1925 02/02/19 1931  WBC 7.2  --   NEUTROABS 3.3  --   HGB 13.2 13.3  HCT 39.5 39.0  MCV 89.8  --   PLT 169  --    Basic Metabolic Panel: Recent Labs  Lab 02/02/19 1925 02/02/19 1931  NA 140 140  K 3.7 3.7  CL 107 104  CO2 24  --   GLUCOSE 113* 108*  BUN 9 9  CREATININE 0.89 0.80  CALCIUM 9.4  --    GFR: Estimated Creatinine Clearance: 87.6 mL/min (by C-G formula based on SCr of 0.8 mg/dL). Liver Function Tests: Recent Labs  Lab 02/02/19 1925  AST 18  ALT 12  ALKPHOS 60  BILITOT 0.9  PROT 6.3*  ALBUMIN 4.2   No results for input(s): LIPASE, AMYLASE in the last 168 hours. No results for input(s): AMMONIA in the last 168 hours. Coagulation Profile: Recent Labs  Lab 02/02/19 1925  INR 1.2   Cardiac Enzymes: No results for input(s): CKTOTAL, CKMB, CKMBINDEX, TROPONINI in the last 168 hours. BNP (last 3 results) No results for input(s): PROBNP in the last 8760 hours. HbA1C: Recent Labs    02/03/19 0403  HGBA1C 5.9*   CBG: No results for input(s): GLUCAP in the last 168 hours. Lipid Profile: Recent Labs    02/03/19 0403  CHOL 164  HDL 45  LDLCALC 108*  TRIG 54  CHOLHDL 3.6   Thyroid Function Tests: No results for input(s): TSH, T4TOTAL, FREET4, T3FREE, THYROIDAB in the last 72 hours. Anemia Panel: No results for input(s): VITAMINB12, FOLATE, FERRITIN, TIBC, IRON, RETICCTPCT in the last 72 hours. Sepsis Labs: No results for input(s): PROCALCITON, LATICACIDVEN in the last 168 hours.  Recent Results (from the past 240 hour(s))  SARS Coronavirus 2 (CEPHEID - Performed in [facility] lab), Hosp Order     Status: None   Collection Time: 02/02/19  9:40 PM   Specimen: Nasopharyngeal Swab  Result Value Ref Range Status   SARS Coronavirus 2 NEGATIVE NEGATIVE Final    Comment: (NOTE) If result is NEGATIVE SARS-CoV-2 target nucleic acids are NOT DETECTED. The SARS-CoV-2 RNA is generally detectable in upper and lower  respiratory  specimens during the acute phase of infection. The lowest  concentration of SARS-CoV-2 viral copies this assay can detect is 250  copies / mL. A negative result does not preclude SARS-CoV-2 infection  and should not be used as the sole basis for treatment or other  patient management decisions.  A negative result may occur with  improper specimen collection / handling, submission of specimen other  than nasopharyngeal swab, presence of viral mutation(s) within the  areas targeted by this assay, and inadequate number of viral copies  (<250 copies / mL). A negative result must be combined with clinical  observations, patient history, and epidemiological information. If result is POSITIVE SARS-CoV-2 target nucleic acids are DETECTED. The SARS-CoV-2 RNA is generally detectable in upper and lower  respiratory specimens dur ing the acute phase of infection.  Positive  results are indicative of active infection with SARS-CoV-2.  Clinical  correlation with patient history and other diagnostic information is  necessary to determine patient infection status.  Positive results do  not rule out bacterial infection or co-infection with other viruses. If result is PRESUMPTIVE POSTIVE  SARS-CoV-2 nucleic acids MAY BE PRESENT.   A presumptive positive result was obtained on the submitted specimen  and confirmed on repeat testing.  While 2019 novel coronavirus  (SARS-CoV-2) nucleic acids may be present in the submitted sample  additional confirmatory testing may be necessary for epidemiological  and / or clinical management purposes  to differentiate between  SARS-CoV-2 and other Sarbecovirus currently known to infect humans.  If clinically indicated additional testing with an alternate test  methodology [phone]) is advised. The SARS-CoV-2 RNA is generally  detectable in upper and lower respiratory sp ecimens during the acute  phase of infection. The expected result is Negative. Fact Sheet for  Patients:  StrictlyIdeas.no Fact Sheet for Healthcare Providers: BankingDealers.co.za This test is not yet approved or cleared by the [place] FDA and has been authorized for detection and/or diagnosis of SARS-CoV-2 by FDA under an Emergency Use Authorization (EUA).  This EUA will remain in effect (meaning this test can be used) for the duration of the COVID-19 declaration under Section 564(b)(1) of the Act, 21 U.S.C. section 360bbb-3(b)(1), unless the authorization is terminated or revoked sooner. Performed at [facility], [place] [address] [place], [place] [address]          Radiology Studies: Ct Angio Head W Or Wo Contrast  Result Date: 02/02/2019 CLINICAL DATA:  Acute presentation with right arm weakness and slurred speech EXAM: CT ANGIOGRAPHY HEAD AND NECK TECHNIQUE: Multidetector CT imaging of the head and neck was performed using the standard protocol during bolus administration of intravenous contrast. Multiplanar CT image reconstructions and MIPs were obtained to evaluate the vascular anatomy. Carotid stenosis measurements (when applicable) are obtained utilizing NASCET criteria, using the distal internal carotid diameter as the denominator. CONTRAST:  74mL OMNIPAQUE IOHEXOL 350 MG/ML SOLN COMPARISON:  Head CT same day.  MRI and CT studies 12/29/2018 FINDINGS: CTA NECK FINDINGS Aortic arch: Aortic atherosclerosis. No aneurysm or dissection. Branching pattern is normal without origin stenosis. Right carotid system: Common carotid artery widely patent to the bifurcation. Carotid bifurcation shows minimal atherosclerotic disease but no stenosis or irregularity. Cervical ICA is tortuous but widely patent. Left carotid system: Left common carotid artery is widely patent to the bifurcation. Carotid bifurcation is widely patent. Cervical ICA is normal. Vertebral arteries: Both vertebral artery origins are widely patent. Both vertebral  arteries are normal through the cervical region to the foramen magnum. Skeleton: Advanced chronic cervical spondylosis as seen previously. Other neck: No soft tissue mass or lymphadenopathy. Upper chest: Negative Review of the MIP images confirms the above findings CTA HEAD FINDINGS Anterior circulation: Both internal carotid arteries are patent through the skull base and siphon regions. Siphon atherosclerotic calcification but no stenosis. The anterior and middle cerebral vessels are patent without proximal stenosis, aneurysm or vascular malformation. No large or medium vessel occlusion. Posterior circulation: Both vertebral arteries are widely patent to the basilar. No basilar stenosis. Posterior circulation branch vessels are patent. Mild irregularity of both PCA vessels as shown previously. Venous sinuses: Patent and normal. Anatomic variants: None significant. Delayed phase: Not performed. Review of the MIP images confirms the above findings IMPRESSION: No acute vascular finding. No change since 1 month ago. Normal except for minimal nonstenotic atherosclerotic change at the bifurcation regions and siphons and some atherosclerotic narrowing and irregularity of the PCA branches. These results were communicated to Dr. Leonel Ramsay at 7:48 pmon 7/20/2020by text page via the [facility] messaging system. Electronically Signed   By: Nelson Chimes M.D.   On:  02/02/2019 19:50   Ct Angio Neck W Or Wo Contrast  Result Date: 02/02/2019 CLINICAL DATA:  Acute presentation with right arm weakness and slurred speech EXAM: CT ANGIOGRAPHY HEAD AND NECK TECHNIQUE: Multidetector CT imaging of the head and neck was performed using the standard protocol during bolus administration of intravenous contrast. Multiplanar CT image reconstructions and MIPs were obtained to evaluate the vascular anatomy. Carotid stenosis measurements (when applicable) are obtained utilizing NASCET criteria, using the distal internal carotid diameter as the  denominator. CONTRAST:  84mL OMNIPAQUE IOHEXOL 350 MG/ML SOLN COMPARISON:  Head CT same day.  MRI and CT studies 12/29/2018 FINDINGS: CTA NECK FINDINGS Aortic arch: Aortic atherosclerosis. No aneurysm or dissection. Branching pattern is normal without origin stenosis. Right carotid system: Common carotid artery widely patent to the bifurcation. Carotid bifurcation shows minimal atherosclerotic disease but no stenosis or irregularity. Cervical ICA is tortuous but widely patent. Left carotid system: Left common carotid artery is widely patent to the bifurcation. Carotid bifurcation is widely patent. Cervical ICA is normal. Vertebral arteries: Both vertebral artery origins are widely patent. Both vertebral arteries are normal through the cervical region to the foramen magnum. Skeleton: Advanced chronic cervical spondylosis as seen previously. Other neck: No soft tissue mass or lymphadenopathy. Upper chest: Negative Review of the MIP images confirms the above findings CTA HEAD FINDINGS Anterior circulation: Both internal carotid arteries are patent through the skull base and siphon regions. Siphon atherosclerotic calcification but no stenosis. The anterior and middle cerebral vessels are patent without proximal stenosis, aneurysm or vascular malformation. No large or medium vessel occlusion. Posterior circulation: Both vertebral arteries are widely patent to the basilar. No basilar stenosis. Posterior circulation branch vessels are patent. Mild irregularity of both PCA vessels as shown previously. Venous sinuses: Patent and normal. Anatomic variants: None significant. Delayed phase: Not performed. Review of the MIP images confirms the above findings IMPRESSION: No acute vascular finding. No change since 1 month ago. Normal except for minimal nonstenotic atherosclerotic change at the bifurcation regions and siphons and some atherosclerotic narrowing and irregularity of the PCA branches. These results were communicated to  Dr. Leonel Ramsay at 7:48 pmon 7/20/2020by text page via the [facility] messaging system. Electronically Signed   By: Nelson Chimes M.D.   On: 02/02/2019 19:50   Mr Brain Wo Contrast  Result Date: 02/03/2019 EXAM: MRI HEAD WITHOUT CONTRAST TECHNIQUE: Multiplanar, multiecho pulse sequences of the brain and surrounding structures were obtained without intravenous contrast. COMPARISON:  None. FINDINGS: Brain: There is no acute ischemia. There is a large area of hyperintense T2-weighted signal centered within the left frontal lobe, unchanged from the prior study. This is superimposed on diffuse periventricular white matter hyperintensity. No contrast agent was administered on the current examination, limiting comparison to the prior study. There are areas of chronic hemorrhage at the left frontal lobe resection site. There is no midline shift or other mass effect. No acute intracranial hemorrhage. Vascular: The major intracranial arterial and venous sinus flow voids are preserved. Skull and upper cervical spine: Remote left pterional craniotomy. Sinuses/Orbits: No fluid levels or advanced mucosal thickening of the visualized paranasal sinuses. No mastoid or middle ear effusion. The orbits are normal. Other: None IMPRESSION: 1. Unchanged appearance of large amount of hyperintense T2-weighted signal centered in the left frontal lobe, which may indicate a combination of edema and/or nonenhancing tumor. 2. Comparison otherwise limited by lack of intravenous contrast material on the current study. 3. No acute ischemia. Electronically Signed   By: Ulyses Jarred  M.D.   On: 02/03/2019 03:01   Ct Head Code Stroke Wo Contrast  Result Date: 02/02/2019 CLINICAL DATA:  Code stroke. Symptoms of right arm weakness and slurred speech. Glioblastoma. EXAM: CT HEAD WITHOUT CONTRAST TECHNIQUE: Contiguous axial images were obtained from the base of the skull through the vertex without intravenous contrast. COMPARISON:  MRI 12/29/2018.  CT  12/29/2018 FINDINGS: Brain: Previous left frontal craniotomy. 3 cm left frontal mass consistent with glioblastoma difficult to appreciate by noncontrast CT. Extensive vasogenic edema throughout the left hemisphere as seen previously. White matter low density consistent with previous radiation change as seen previously. No sign of acute infarction, hemorrhage, hydrocephalus or extra-axial collection. No midline shift. Vascular: No abnormal vascular finding. Skull: Left frontal craniotomy changes. Sinuses/Orbits: Negative Other: None ASPECTS ([place] Stroke Program Early CT Score) - Ganglionic level infarction (caudate, lentiform nuclei, internal capsule, insula, M1-M3 cortex): 7 - Supraganglionic infarction (M4-M6 cortex): 3 Total score (0-10 with 10 being normal): 10 IMPRESSION: 1. No sign of ischemic disease in this patient with a known left frontal glioblastoma, extensive left frontal vasogenic edema and widespread white matter changes subsequent radiation. No change since the previous studies of last month. 2. ASPECTS is 10. 3. These results were communicated to Dr. Leonel Ramsay at 7:34 pmon 7/20/2020by text page via the [facility] messaging system. Electronically Signed   By: Nelson Chimes M.D.   On: 02/02/2019 19:36        Scheduled Meds:  apixaban  5 mg Oral BID   lacosamide  50 mg Oral BID   multivitamin with minerals  1 tablet Oral Daily   sodium chloride flush  3 mL Intravenous Once   vitamin B-12  500 mcg Oral Daily   Continuous Infusions:  sodium chloride Stopped (02/03/19 0038)   levETIRAcetam 1,500 mg (02/04/19 0816)     LOS: 1 day    Time spent: 35 mins.More than 50% of that time was spent in counseling and/or coordination of care.      Shelly Coss, MD [facility] Pager [phone]  If 7PM-7AM, please contact night-coverage [email or web address] Password [facility] 02/04/2019, 10:49 AM

## 2019-02-04 NOTE — Progress Notes (Addendum)
Inpatient Rehab Admissions:  Inpatient Rehab Consult received.  I met with patient at the bedside for rehabilitation assessment and to discuss goals and expectations of an inpatient rehab admission.  He is open to CIR and agreed that I could speak with his daughter to confirm 24/7 assist at discharge and contact his insurance to seek prior authorization.  Will continue to follow for possible admission pending insurance approval and support at discharge.   Addendum: I left a voicemail for pt's daughter to discuss CIR.   1545: I was able to speak with [name] (pt's daughter) and she confirms that family is able to provide support at discharge.  She works nights, but her 57 y/o son will be home during that time. Await insurance authorization and completion of medical workup for possible CIR admission tomorrow.   Signed: Shann Medal, PT, DPT Admissions Coordinator [phone] 02/04/19  11:40 AM

## 2019-02-04 NOTE — Progress Notes (Signed)
Occupational Therapy Treatment Patient Details Name: Christopher Morris MRN: [id number] DOB: 01/08/1941 Today's Date: 02/04/2019    History of present illness Patient is a 78 y/o male presenting to the ED on 02/02/2019 with primary complaints of R sided weakness and aphasia. PAst medical history significant of barin glioblastoma (s/p of surgical resection, radiation and chemotherapy), grand mal seizure, PE on Eliquis. CT-head: No sign of ischemic disease. Continued work-up.    OT comments  Pt making steady progress towards goals with pt being able to utilize R UE in more of a functional way this session. Pt continues to display communication deficits. He requires mod - max multimodal cuing for sequencing and initiation with increased time . Pt will continue to benefit from OT intervention.   Follow Up Recommendations  CIR;Supervision/Assistance - 24 hour    Equipment Recommendations  Other (comment)(defer to next venue of care)    Recommendations for Other Services      Precautions / Restrictions Precautions Precautions: Fall Precaution Comments: R inattention/ apraxia Restrictions Weight Bearing Restrictions: No       Mobility Bed Mobility Overal bed mobility: Needs Assistance Bed Mobility: Supine to Sit     Supine to sit: Mod assist;+2 for physical assistance Sit to supine: Mod assist;+2 for physical assistance   General bed mobility comments: Mod A +2 for safety to progress EOB. Cues for sequencing and technique.  Transfers Overall transfer level: Needs assistance Equipment used: 2 person hand held assist Transfers: Sit to/from [facility] Sit to Stand: Mod assist;+2 physical assistance Stand pivot transfers: Mod assist;+2 physical assistance       General transfer comment: Mod A +2 to steady. Assist given on R LE to progress foot and facilitation given at hips for weight shift. Posterior bias, but able to correct with VCs.    Balance Overall balance  assessment: Needs assistance Sitting-balance support: Bilateral upper extremity supported;Feet supported Sitting balance-Leahy Scale: Poor Sitting balance - Comments: Able to sit EOB with min A to min guard for safety. Posterior bias, but able to correct with VCs. At times pulling on PTA's hands and leaning backward. Postural control: Posterior lean Standing balance support: Bilateral upper extremity supported;During functional activity Standing balance-Leahy Scale: Poor Standing balance comment: required Mod A +2 for balance        ADL either performed or assessed with clinical judgement   ADL Overall ADL's : Needs assistance/impaired     Grooming: Standing;Moderate assistance Grooming Details (indicate cue type and reason): standing with mod A for balance while washing hands without UE support        General ADL Comments: Pt standing with min A of 2 for standing balance and able to wash buttocks with L UE     Vision Baseline Vision/History: No visual deficits Patient Visual Report: No change from baseline            Cognition Arousal/Alertness: Awake/alert Behavior During Therapy: WFL for tasks assessed/performed Overall Cognitive Status: Difficult to assess Area of Impairment: Orientation;Following commands;Safety/judgement;Problem solving                 Orientation Level: Disoriented to;Time;Situation;Person;Place Current Attention Level: Sustained   Following Commands: Follows one step commands with increased time;Follows one step commands inconsistently Safety/Judgement: Decreased awareness of safety;Decreased awareness of deficits Awareness: Intellectual Problem Solving: Difficulty sequencing;Requires verbal cues;Requires tactile cues;Slow processing General Comments: Pt lethargic and disoriented on arrival to room. Became more alert once seated upright on EOB.  Pertinent Vitals/ Pain       Pain Assessment: Faces Faces Pain Scale:  No hurt         Frequency  Min 2X/week        Progress Toward Goals  OT Goals(current goals can now be found in the care plan section)  Progress towards OT goals: Progressing toward goals  Acute Rehab OT Goals Patient Stated Goal: none stated OT Goal Formulation: Patient unable to participate in goal setting  Plan Discharge plan remains appropriate    Co-evaluation    PT/OT/SLP Co-Evaluation/Treatment: Yes Reason for Co-Treatment: Complexity of the patient's impairments (multi-system involvement);Necessary to address cognition/behavior during functional activity;For patient/therapist safety;To address functional/ADL transfers PT goals addressed during session: Mobility/safety with mobility OT goals addressed during session: ADL's and self-care;Proper use of Adaptive equipment and DME      AM-PAC OT "6 Clicks" Daily Activity     Outcome Measure     Help from another person taking care of personal grooming?: A Little Help from another person toileting, which includes using toliet, bedpan, or urinal?: A Lot Help from another person bathing (including washing, rinsing, drying)?: A Lot Help from another person to put on and taking off regular upper body clothing?: A Little Help from another person to put on and taking off regular lower body clothing?: A Lot 6 Click Score: 12    End of Session Equipment Utilized During Treatment: Gait belt;Rolling walker  OT Visit Diagnosis: Unsteadiness on feet (R26.81);Other abnormalities of gait and mobility (R26.89);Other symptoms and signs involving cognitive function;Other (comment)   Activity Tolerance Patient tolerated treatment well   Patient Left with call bell/phone within reach;with bed alarm set;in bed   Nurse Communication Mobility status;Precautions        Time: 4469-5072 OT Time Calculation (min): 23 min  Charges: OT General Charges $OT Visit: 1 Visit OT Treatments $Self Care/Home Management : 8-22  mins   Gypsy Decant 02/04/2019, 2:36 PM

## 2019-02-05 ENCOUNTER — Other Ambulatory Visit: Payer: Self-pay

## 2019-02-05 LAB — GLUCOSE, CAPILLARY: Glucose-Capillary: 92 mg/dL (ref 70–99)

## 2019-02-05 NOTE — H&P (Signed)
Physical Medicine and Rehabilitation Admission H&P    Chief Complaint  Patient presents with  . Code Stroke  : HPI: Christopher Morris is an 78 year old right-handed male with history of glioblastoma status post surgical resection radiation and chemotherapy 09/30/2017 per Dr. Sherwood Gambler, seizure disorder maintained on Keppra 1250 mg twice daily followed by Dr. Mickeal Skinner, pulmonary emboli maintained on Eliquis.  Per chart review patient lives with daughter who works nights and there is a 43 year old grandson available during the day.  1 level home one-step to entry.  Used a Rollator for functional mobility prior to admission.  Presented 02/02/2019 with right side weakness and aphasia.  It was noted patient with recent admission 12/29/2018 discharge 12/30/2018 following a seizure and Keppra was adjusted at that time.  COVID negative, chemistries unremarkable.  Cranial CT scan 02/02/2019 no sign of ischemic disease and no change since previous studies of 12/29/2018.  CT angiogram of head and neck no acute vascular findings.  Patient with follow-up MRI again unchanged.  EEG 02/03/2019 showing cortical dysfunction in left hemisphere, maximal left frontocentral region consistent with history of craniotomy no seizure noted.  Neurology follow-up and Keppra was increased to 1500 mg twice daily as well as the addition of Vimpat 50 mg twice daily.  Keppra ongoing for history of pulmonary emboli.  Tolerating a regular diet.  Speech therapy follow-up for mild to moderate aphasia and apraxia discussed with daughter who felt patient was at baseline and speech therapy has signed off.  Therapy evaluations completed and patient was admitted for a comprehensive rehab program.  Review of Systems  Constitutional: Negative for chills and fever.  HENT: Negative for hearing loss.   Eyes: Negative for blurred vision and double vision.  Respiratory: Negative for cough and shortness of breath.   Cardiovascular: Positive for leg swelling.  Negative for chest pain and palpitations.  Gastrointestinal: Positive for constipation. Negative for heartburn, nausea and vomiting.  Genitourinary: Positive for urgency. Negative for dysuria, flank pain and hematuria.  Musculoskeletal: Positive for myalgias.  Skin: Negative for rash.  Neurological: Positive for sensory change, speech change, focal weakness and seizures.  All other systems reviewed and are negative.  Past Medical History:  Diagnosis Date  . Brain cancer ([place])   . Glioblastoma ([place]) 09/25/2017  . Hyperglycemia 09/25/2017  . Seizure ([place]) 09/25/2017   Past Surgical History:  Procedure Laterality Date  . APPLICATION OF CRANIAL NAVIGATION N/A 09/30/2017   Procedure: APPLICATION OF CRANIAL NAVIGATION;  Surgeon: [name], MD;  Location: [place];  Service: Neurosurgery;  Laterality: N/A;  . CRANIOTOMY N/A 09/30/2017   Procedure: CRANIOTOMY FOR RESECTION OF BRAIN TUMOR WITH STEREOTACTIC;  Surgeon: [name], MD;  Location: [place];  Service: Neurosurgery;  Laterality: N/A;  CRANIOTOMY FOR RESECTION OF BRAIN TUMOR WITH STEREOTACTIC   Family History  Problem Relation Age of Onset  . Hypertension Daughter   . Stroke Mother   . Cancer Neg Hx    Social History:  reports that he has never smoked. He has never used smokeless tobacco. He reports that he does not drink alcohol or use drugs. Allergies:  Allergies  Allergen Reactions  . Pork-Derived Products     Patient does not eat pork  . Shellfish Allergy     Patient does not eat shellfish   Medications Prior to Admission  Medication Sig Dispense Refill  . apixaban (ELIQUIS) 5 MG TABS tablet Take 10 mg BID till 12/24/2017, start taking 5 mg BID from 12/25/2017 (Patient taking differently: Take  5 mg by mouth 2 (two) times daily. ) 60 tablet 0  . levETIRAcetam (KEPPRA) 250 MG tablet Take 5 tablets (1,250 mg total) by mouth 2 (two) times daily. 300 tablet 2  . Multiple Vitamin (MULTIVITAMIN) tablet Take 1 tablet by mouth  daily.    . vitamin B-12 (CYANOCOBALAMIN) 500 MCG tablet Take 500 mcg by mouth daily.      Drug Regimen Review Drug regimen was reviewed and remains appropriate with no significant issues identified  Home: Home Living Family/patient expects to be discharged to:: Inpatient rehab Living Arrangements: Children Available Help at Discharge: Family, Available PRN/intermittently Type of Home: House Home Access: Stairs to enter [employer] of Steps: 1 Home Layout: One level Bathroom Shower/Tub: Multimedia programmer: Standard Home Equipment: Environmental consultant - 4 wheels, Shower seat Additional Comments: patient is a poor historian, home environment taken form most recent admission  Lives With: Daughter   Functional History: Prior Function Level of Independence: Needs assistance Gait / Transfers Assistance Needed: Uses rollator for functional mobility ADL's / Homemaking Assistance Needed: reports that he does ADLs with shower seat, children help with IADL  Functional Status:  Mobility: Bed Mobility Overal bed mobility: Needs Assistance Bed Mobility: Supine to Sit Supine to sit: Min guard, HOB elevated Sit to supine: Mod assist, +2 for physical assistance General bed mobility comments: increased time and effort and cues for sequencing needed; min gaurd for safety Transfers Overall transfer level: Needs assistance Equipment used: Rolling walker (2 wheeled) Transfers: Sit to/from Stand Sit to Stand: Min assist, Mod assist, +2 physical assistance, +2 safety/equipment Stand pivot transfers: Mod assist, +2 physical assistance General transfer comment: assist to power up into standing and then for balance once in standing; pt began sitting prematurely and with unsafe use of AD to get from recliner to [facility]  Ambulation/Gait Ambulation/Gait assistance: Min assist, Mod assist Gait Distance (Feet): 90 Feet Assistive device: Rolling walker (2 wheeled) Gait Pattern/deviations:  Step-through pattern, Trunk flexed, Wide base of support, Decreased step length - left, Decreased step length - right, Decreased dorsiflexion - left, Decreased dorsiflexion - right General Gait Details: cues for increased bilat step lengths; assist for balance and weight shfiting due to L lateral bias    ADL: ADL Overall ADL's : Needs assistance/impaired Grooming: Standing, Moderate assistance Grooming Details (indicate cue type and reason): standing with mod A for balance while washing hands without UE support Lower Body Bathing: Cueing for sequencing, Sit to/from stand, Cueing for safety, Sitting/lateral leans, Moderate assistance, Maximal assistance Upper Body Dressing : Moderate assistance, Cueing for safety, Cueing for sequencing Lower Body Dressing: Maximal assistance, Sit to/from stand Lower Body Dressing Details (indicate cue type and reason): donning socks seated EOB in figure four position with cuing to utilized B UEs for task General ADL Comments: Pt standing with min A of 2 for standing balance and able to wash buttocks with L UE  Cognition: Cognition Overall Cognitive Status: Impaired/Different from baseline Arousal/Alertness: Awake/alert Orientation Level: Oriented to person, Oriented to place, Oriented to situation, Disoriented to time(can tell year but not month) Attention: Focused, Sustained Focused Attention: Appears intact Sustained Attention: Appears intact Cognition Arousal/Alertness: Awake/alert Behavior During Therapy: WFL for tasks assessed/performed Overall Cognitive Status: Impaired/Different from baseline Area of Impairment: Following commands, Safety/judgement, Problem solving Orientation Level: Disoriented to, Time, Situation, Person, Place Current Attention Level: Sustained Following Commands: Follows one step commands with increased time, Follows one step commands inconsistently Safety/Judgement: Decreased awareness of safety, Decreased awareness of  deficits Awareness: Intellectual  Problem Solving: Difficulty sequencing, Requires verbal cues, Requires tactile cues, Slow processing General Comments: Pt lethargic and disoriented on arrival to room. Became more alert once seated upright on EOB. Difficult to assess due to: Impaired communication  Physical Exam: Blood pressure 123/75, pulse 63, temperature 97.8 F (36.6 C), temperature source Oral, resp. rate 18, height 5\' 11"  (1.803 m), weight 97.3 kg, SpO2 99 %. Physical Exam  Constitutional: He appears well-developed and well-nourished.  obese  HENT:  Head: Normocephalic and atraumatic.  Eyes: Pupils are equal, round, and reactive to light. EOM are normal.  Neck: Normal range of motion. No thyromegaly present.  Cardiovascular: Normal rate and regular rhythm.  No murmur heard. Respiratory: Effort normal. No respiratory distress. He has no wheezes.  GI: Soft. He exhibits no distension. There is no abdominal tenderness.  Neurological: He is alert.  Oriented to place, name, month, year, almost day (2 off). Pt with processing delays and word finding deficits/apraxia.  RUE 4/5. LUE 4+/5. RLE 3-4/5 prox to distal. LLE 3+ to 4+/5. No focal sensory loss. DTR's trace to 1+     Skin: Skin is warm and dry.  Psychiatric: He has a normal mood and affect. His behavior is normal.    Results for orders placed or performed during the hospital encounter of 02/02/19 (from the past 48 hour(s))  Glucose, capillary     Status: None   Collection Time: 02/05/19  6:23 AM  Result Value Ref Range   Glucose-Capillary 92 70 - 99 mg/dL   Comment 1 Notify RN    No results found.     Medical Problem List and Plan: 1.  Right side weakness with aphasia secondary to acute metabolic encephalopathy with history of glioblastoma with resection.  -admit to inpatient rehab 2.  Antithrombotics: -DVT/anticoagulation: Eliquis  -antiplatelet therapy: N/A 3. Pain Management: Tylenol as needed 4. Mood: Provide  emotional support  -antipsychotic agents: N/A 5. Neuropsych: This patient is not capable of making decisions on his own behalf. 6. Skin/Wound Care: Routine skin checks 7. Fluids/Electrolytes/Nutrition: Routine in and outs with follow-up chemistries 8.  Seizure disorder.  Keppra increased to 1500 mg twice daily and the addition of Vimpat 50 mg twice daily.  EEG negative for seizure.  Patient to follow-up Dr. Mickeal Skinner as outpatient       Lavon Paganini Angiulli, PA-C 02/06/2019

## 2019-02-05 NOTE — PMR Pre-admission (Signed)
PMR Admission Coordinator Pre-Admission Assessment  Patient: Christopher Morris is an 78 y.o., male MRN: [id number] DOB: May 13, 1941 Height: '5\' 11"'$  (180.3 cm) Weight: 97.3 kg  Insurance Information HMO: yes    PPO:      PCP:      IPA:      80/20:      OTHER:  PRIMARY: [employer] Medicare      Policy#: [id number]      Subscriber: patient CM Name: Lorayne Bender      Phone#: [phone] ext [phone]     Fax#:  Pre-Cert#: [id number] authorization given by Lorayne Bender with [facility] Medicare for admission to [facility] on 7/24 with f/u due to [place] on 7/30 at fax [phone]      Employer:  Benefits:  Phone #: [phone]     Name:  Eff. Date: 09/14/2018     Deduct: $0      Out of Pocket Max: $3600 (met $485)      Life Max: n/a CIR: $295/days 1-5      SNF: 20 full days Outpatient:     Co-Pay: $30 Home Health: 100%      Co-Pay:  DME: 80%     Co-Pay: 20% Providers: in network   SECONDARY:       Policy#:       Subscriber:  CM Name:       Phone#:      Fax#:  Pre-Cert#:       Employer:  Benefits:  Phone #:      Name:  Eff. Date:      Deduct:       Out of Pocket Max:       Life Max:  CIR:       SNF:  Outpatient:      Co-Pay:  Home Health:       Co-Pay:  DME:      Co-Pay:   Medicaid Application Date:       Case Manager:  Disability Application Date:       Case Worker:   The "Data Collection Information Summary" for patients in Inpatient Rehabilitation Facilities with attached "Privacy Act [place] Records" was provided and verbally reviewed with: Family  Emergency Contact Information Contact Information    Name Relation Home Work [place] Daughter [phone]  [phone]      Current Medical History  Patient Admitting Diagnosis: metabolic encephalopathy, seizures  History of Present Illness: Christopher Morris is an 78 year old right-handed male with history of glioblastoma status post surgical resection radiation and chemotherapy 09/30/2017 per Dr. Sherwood Gambler, seizure disorder maintained on  Keppra 1250 mg twice daily followed by Dr. Mickeal Skinner, pulmonary emboli maintained on Eliquis.  Presented 02/02/2019 with right side weakness and aphasia.  It was noted patient with recent admission 12/29/2018 discharge 12/30/2018 following a seizure and Keppra was adjusted at that time.  COVID negative, chemistries unremarkable.  Cranial CT scan 02/02/2019 no sign of ischemic disease and no change since previous studies of 12/29/2018.  CT angiogram of head and neck no acute vascular findings.  Patient with follow-up MRI again unchanged.  EEG 02/03/2019 showing cortical dysfunction in left hemisphere, maximal left frontocentral region consistent with history of craniotomy no seizure noted.  Neurology follow-up and Keppra was increased to 1500 mg twice daily as well as the addition of Vimpat 50 mg twice daily.  Keppra ongoing for history of pulmonary emboli.  Tolerating a regular diet.  Speech therapy follow-up for mild to moderate aphasia and apraxia. SLP discussed with daughter who  felt patient was at baseline and speech therapy has signed off.  Therapy evaluations completed and patient was recommended for CIR.   Complete NIHSS TOTAL: 3  Patient's medical record from [facility] has been reviewed by the rehabilitation admission coordinator and physician.  Past Medical History  Past Medical History:  Diagnosis Date  . Brain cancer ([place])   . Glioblastoma ([place]) 09/25/2017  . Hyperglycemia 09/25/2017  . Seizure ([place]) 09/25/2017    Family History   family history includes Hypertension in his daughter; Stroke in his mother.  Prior Rehab/Hospitalizations Has the patient had prior rehab or hospitalizations prior to admission? Yes  Has the patient had major surgery during 100 days prior to admission? No   Current Medications  Current Facility-Administered Medications:  .  0.9 %  sodium chloride infusion, , Intravenous, PRN, Ivor Costa, MD, Stopped at 02/03/19 0038 .  acetaminophen (TYLENOL) tablet 650  mg, 650 mg, Oral, Q4H PRN **OR** acetaminophen (TYLENOL) solution 650 mg, 650 mg, Per Tube, Q4H PRN **OR** acetaminophen (TYLENOL) suppository 650 mg, 650 mg, Rectal, Q4H PRN, Ivor Costa, MD .  apixaban (ELIQUIS) tablet 5 mg, 5 mg, Oral, BID, Ivor Costa, MD, 5 mg at 02/05/19 2320 .  lacosamide (VIMPAT) tablet 50 mg, 50 mg, Oral, BID, Marliss Coots, PA-C, 50 mg at 02/05/19 2319 .  levETIRAcetam (KEPPRA) tablet 1,500 mg, 1,500 mg, Oral, BID, Adhikari, Amrit, MD, 1,500 mg at 02/05/19 2319 .  LORazepam (ATIVAN) injection 1 mg, 1 mg, Intravenous, Q2H PRN, Ivor Costa, MD .  multivitamin with minerals tablet 1 tablet, 1 tablet, Oral, Daily, Ivor Costa, MD, 1 tablet at 02/05/19 1057 .  senna-docusate (Senokot-S) tablet 1 tablet, 1 tablet, Oral, QHS PRN, Ivor Costa, MD .  sodium chloride flush (NS) 0.9 % injection 3 mL, 3 mL, Intravenous, Once, Ivor Costa, MD .  vitamin B-12 (CYANOCOBALAMIN) tablet 500 mcg, 500 mcg, Oral, Daily, Ivor Costa, MD, 500 mcg at 02/05/19 1056  Patients Current Diet:  Diet Order            Diet - low sodium heart healthy        Diet Heart Room service appropriate? Yes; Fluid consistency: Thin  Diet effective now              Precautions / Restrictions Precautions Precautions: Fall Precaution Comments: R inattention/ apraxia Restrictions Weight Bearing Restrictions: No   Has the patient had 2 or more falls or a fall with injury in the past year? Yes  Prior Activity Level Limited Community (1-2x/wk): limited outings since diagnosis of glioblastoma and new onset seizures, not driving  Prior Functional Level Self Care: Did the patient need help bathing, dressing, using the toilet or eating? Needed some help  Indoor Mobility: Did the patient need assistance with walking from room to room (with or without device)? Independent  Stairs: Did the patient need assistance with internal or external stairs (with or without device)? Needed some help  Functional Cognition:  Did the patient need help planning regular tasks such as shopping or remembering to take medications? Needed some help  Home Assistive Devices / Equipment Home Equipment: Walker - 4 wheels, Shower seat  Prior Device Use: Indicate devices/aids used by the patient prior to current illness, exacerbation or injury? Walker  Current Functional Level Cognition  Arousal/Alertness: Awake/alert Overall Cognitive Status: Impaired/Different from baseline Difficult to assess due to: Impaired communication Current Attention Level: Sustained Orientation Level: Oriented to person, Oriented to place, Oriented to situation, Disoriented to time(can  tell year but not month) Following Commands: Follows one step commands with increased time, Follows one step commands inconsistently Safety/Judgement: Decreased awareness of safety, Decreased awareness of deficits General Comments: Pt lethargic and disoriented on arrival to room. Became more alert once seated upright on EOB. Attention: Focused, Sustained Focused Attention: Appears intact Sustained Attention: Appears intact    Extremity Assessment (includes Sensation/Coordination)  Upper Extremity Assessment: Defer to OT evaluation RUE Deficits / Details: apraxia; proprioception not intact. No able to formally assess but can utilize in functional way with max multimodal cuing to attend to hand RUE Coordination: decreased fine motor, decreased gross motor  Lower Extremity Assessment: Defer to PT evaluation RLE Deficits / Details: decreased weight shift to this LE in standing; inattention to R LE     ADLs  Overall ADL's : Needs assistance/impaired Grooming: Standing, Moderate assistance Grooming Details (indicate cue type and reason): standing with mod A for balance while washing hands without UE support Lower Body Bathing: Cueing for sequencing, Sit to/from stand, Cueing for safety, Sitting/lateral leans, Moderate assistance, Maximal assistance Upper Body  Dressing : Moderate assistance, Cueing for safety, Cueing for sequencing Lower Body Dressing: Maximal assistance, Sit to/from stand Lower Body Dressing Details (indicate cue type and reason): donning socks seated EOB in figure four position with cuing to utilized B UEs for task General ADL Comments: Pt standing with min A of 2 for standing balance and able to wash buttocks with L UE    Mobility  Overal bed mobility: Needs Assistance Bed Mobility: Supine to Sit Supine to sit: Min guard, HOB elevated Sit to supine: Mod assist, +2 for physical assistance General bed mobility comments: increased time and effort and cues for sequencing needed; min gaurd for safety    Transfers  Overall transfer level: Needs assistance Equipment used: Rolling walker (2 wheeled) Transfers: Sit to/from Stand Sit to Stand: Min assist, Mod assist, +2 physical assistance, +2 safety/equipment Stand pivot transfers: Mod assist, +2 physical assistance General transfer comment: assist to power up into standing and then for balance once in standing; pt began sitting prematurely and with unsafe use of AD to get from recliner to [place] / Gait / Stairs / Wheelchair Mobility  Ambulation/Gait Ambulation/Gait assistance: Min assist, Mod assist Gait Distance (Feet): 90 Feet Assistive device: Rolling walker (2 wheeled) Gait Pattern/deviations: Step-through pattern, Trunk flexed, Wide base of support, Decreased step length - left, Decreased step length - right, Decreased dorsiflexion - left, Decreased dorsiflexion - right General Gait Details: cues for increased bilat step lengths; assist for balance and weight shfiting due to L lateral bias    Posture / Balance Dynamic Sitting Balance Sitting balance - Comments: Able to sit EOB with min A to min guard for safety. Posterior bias, but able to correct with VCs. At times pulling on PTA's hands and leaning backward. Balance Overall balance assessment: Needs  assistance Sitting-balance support: Feet supported Sitting balance-Leahy Scale: Fair Sitting balance - Comments: Able to sit EOB with min A to min guard for safety. Posterior bias, but able to correct with VCs. At times pulling on PTA's hands and leaning backward. Postural control: Posterior lean Standing balance support: Bilateral upper extremity supported, During functional activity Standing balance-Leahy Scale: Poor Standing balance comment: required Mod A +2 for balance    Special needs/care consideration BiPAP/CPAP no CPM no Continuous Drip IV no Dialysis no        Days n/a Life Vest no Oxygen no Special Bed no Lurline Idol  Size no Wound Vac (area) no      Location n/a Skin intact                           Bowel mgmt: continent, last BM PTA Bladder mgmt: continent Diabetic mgmt: no Behavioral consideration no Chemo/radiation no   Previous Home Environment (from acute therapy documentation) Living Arrangements: Children  Lives With: Daughter Available Help at Discharge: Family, Available PRN/intermittently Type of Home: House Home Layout: One level Home Access: Stairs to enter Technical brewer of Steps: 1 Bathroom Shower/Tub: Multimedia programmer: [place]: No Additional Comments: patient is a poor historian, home environment taken form most recent admission  Discharge Living Setting Plans for Discharge Living Setting: Lives with (comment)(daughter, [name]) Type of Home at Discharge: House Discharge Home Layout: One level Discharge Home Access: Stairs to enter Entrance Stairs-Rails: None Entrance Stairs-Number of Steps: 1 Discharge Bathroom Shower/Tub: Walk-in shower Discharge Bathroom Toilet: Standard Discharge Bathroom Accessibility: Yes How Accessible: Accessible via walker Does the patient have any problems obtaining your medications?: No  Social/Family/Support Systems Anticipated Caregiver: daughter [name] Anticipated Garment/textile technologist Information: [phone] Ability/Limitations of Caregiver: daughter works nights, but her 18 y/o son is home at night and able to assist as needed Caregiver Availability: 24/7 Discharge Plan Discussed with Primary Caregiver: Yes Is Caregiver In Agreement with Plan?: Yes Does Caregiver/Family have Issues with Lodging/Transportation while Pt is in Rehab?: No  Goals/Additional Needs Patient/Family Goal for Rehab: PT/OT supervision/min assist, SLP on acute spoke with daughter and confirmed speech/cognition is baseline Expected length of stay: 18-21 days Dietary Needs: heart healthy/thin Equipment Needs: tbd Pt/Family Agrees to Admission and willing to participate: Yes Program Orientation Provided & Reviewed with Pt/Caregiver Including Roles  & Responsibilities: Yes  Decrease burden of Care through IP rehab admission: n/a  Possible need for SNF placement upon discharge: not anticipated.  Discussed with pt's daughter, [name], on 7/22 and confirmed that family was providing supervision and assist for pt at baseline and plan to continue at discharge from CIR.   Patient Condition: I have reviewed medical records from [facility], spoken with CM, and patient and daughter. I met with patient at the bedside and discussed with daughtervia phone for inpatient rehabilitation assessment.  Patient will benefit from ongoing PT and OT, can actively participate in 3 hours of therapy a day 5 days of the week, and can make measurable gains during the admission.  Patient will also benefit from the coordinated team approach during an Inpatient Acute Rehabilitation admission.  The patient will receive intensive therapy as well as Rehabilitation physician, nursing, social worker, and care management interventions.  Due to safety, disease management, medication administration, pain management and patient education the patient requires 24 hour a day rehabilitation nursing.  The patient is currently mod to  total with mobility and basic ADLs.  Discharge setting and therapy post discharge at home with home health is anticipated.  Patient has agreed to participate in the Acute Inpatient Rehabilitation Program and will admit today.  Preadmission Screen Completed By:  Michel Santee, PT, DPT 02/06/2019 10:16 AM ______________________________________________________________________   Discussed status with Dr. Naaman Plummer on 02/06/19  at 10:16 AM  and received approval for admission today.  Admission Coordinator:  Michel Santee, PT, DPT time 10:16 AM/Date 02/06/19    Assessment/Plan: Diagnosis: metabolic encephalopathy 1. Does the need for close, 24 hr/day Medical supervision in concert with the patient's rehab needs make  it unreasonable for this patient to be served in a less intensive setting? Yes 2. Co-Morbidities requiring supervision/potential complications: sz, hx of PE, GBM 3. Due to bladder management, bowel management, safety, skin/wound care, disease management, medication administration, pain management and patient education, does the patient require 24 hr/day rehab nursing? Yes 4. Does the patient require coordinated care of a physician, rehab nurse, PT (1-2 hrs/day, 5 days/week) and OT (1-2 hrs/day, 5 days/week) to address physical and functional deficits in the context of the above medical diagnosis(es)? Yes Addressing deficits in the following areas: balance, endurance, locomotion, strength, transferring, bowel/bladder control, bathing, dressing, feeding, grooming, toileting, cognition and psychosocial support 5. Can the patient actively participate in an intensive therapy program of at least 3 hrs of therapy 5 days a week? Yes 6. The potential for patient to make measurable gains while on inpatient rehab is excellent 7. Anticipated functional outcomes upon discharge from inpatients are: supervision and min assist PT, supervision and min assist OT, n/a SLP 8. Estimated rehab length of stay to  reach the above functional goals is: 18-21days 9. Anticipated D/C setting: Home 10. Anticipated post D/C treatments: [place] therapy 11. Overall Rehab/Functional Prognosis: excellent  MD Signature: Meredith Staggers, MD, [place] [facility] 02/06/2019

## 2019-02-05 NOTE — Progress Notes (Signed)
PROGRESS NOTE    Christopher Morris  IEP:[id number] DOB: 26-Sep-1940 DOA: 02/02/2019 PCP: [facility]   Brief Narrative: Patient is a 78-year-old male with history of brain glioblastoma status post surgical resection, radiation and chemotherapy, seizure disorder, pulmonary embolism on Eliquis who presented with right-sided weakness.  Differential diagnosis include seizure versus stroke.  Neurology following.  MRI did not show a stroke.  Patient evaluated PT/OT and recommended CIR on discharge. Patient is medically stable for discharge to CIR as soon as the bed is available.  Assessment & Plan:   Principal Problem:   Right sided weakness Active Problems:   Seizure (HCC)   Frontal glioblastoma multiforme (HCC)   Pulmonary embolism (HCC)   Acute metabolic encephalopathy   Seizures (HCC)   Right-sided weakness/aphasia/acute metabolic encephalopathy: Etiology unclear at this point.  Differentials include seizure versus stroke.  CT head/CTA negative for acute intracranial abnormalities.  MRI showed unchanged hyperintense T2-weighted signal centered in the left frontal lobe: Possible edema/nonenhancing tumor.  Seen by physical therapy/Occupational Therapy and recommended CIR.  CIR consulted.  He presented with right-sided weakness but looks like  weakness has improved.   His speech is slow but clear. Neurology was following. Patient remains confused this morning.  Oriented to place only.  After discussing with the daughter today, we knew that this is his baseline mental status.  Seizure disorder: Currently on  Keppra and Vimpat.  Continue seizure precaution.  Neurology following.  EEG done which showed cortical dysfunction in the left hemisphere but no seizure or epileptiform discharge.  Frontal glioblastoma multiforme: Status post craniectomy resection, chemo and radiation.  Patient follows with Dr. Mickeal Skinner  Pulmonary embolism: Currently on Eliquis.  Debility/deconditioning: Patient  seen by physical therapy and recommended CIR on discharge          DVT prophylaxis: Eliquis Code Status: Full Family Communication: Called daughter on phone  Disposition Plan: CIR as soon as bed is available   Consultants:   Procedures:MRI,EEG  Antimicrobials:  Anti-infectives (From admission, onward)   None      Subjective:  Patient seen and examined the bedside this morning.  Hemodynamically stable.  Looks very comfortable today.  Speech was clear.  More alert and awake.  Oriented to place only.  No focal neurological deficits on examination.  Objective: Vitals:   02/04/19 1510 02/04/19 1954 02/05/19 0037 02/05/19 0411  BP: 122/79 133/76 132/85 (!) 141/74  Pulse: 65 67 63 63  Resp: 20 18 17 19   Temp: 98.4 F (36.9 C) 98.5 F (36.9 C) 97.8 F (36.6 C) 98.6 F (37 C)  TempSrc: Oral Oral Oral   SpO2: 100% 96% 94% 99%  Weight:      Height:        Intake/Output Summary (Last 24 hours) at 02/05/2019 1246 Last data filed at 02/05/2019 0037 Gross per 24 hour  Intake 770 ml  Output 1700 ml  Net -930 ml   Filed Weights   02/02/19 2235  Weight: 97.3 kg    Examination:  General exam: Appears calm and comfortable ,Not in distress,average built,pleasant debilitated elderly male HEENT:PERRL,Oral mucosa moist, Ear/Nose normal on gross exam Respiratory system: Bilateral equal air entry, normal vesicular breath sounds, no wheezes or crackles  Cardiovascular system: S1 & S2 heard, RRR. No JVD, murmurs, rubs, gallops or clicks. No pedal edema. Gastrointestinal system: Abdomen is nondistended, soft and nontender. No organomegaly or masses felt. Normal bowel sounds heard. Central nervous system: Alert and awake.oriented to place only , no focal neurological deficits  extremities: No edema, no clubbing ,no cyanosis, distal peripheral pulses palpable. Skin: No rashes, lesions or ulcers,no icterus ,no pallor MSK: Normal muscle bulk,tone ,power Psychiatry: Judgement and  insight appear impaired     Data Reviewed: I have personally reviewed following labs and imaging studies  CBC: Recent Labs  Lab 02/02/19 1925 02/02/19 1931  WBC 7.2  --   NEUTROABS 3.3  --   HGB 13.2 13.3  HCT 39.5 39.0  MCV 89.8  --   PLT 169  --    Basic Metabolic Panel: Recent Labs  Lab 02/02/19 1925 02/02/19 1931  NA 140 140  K 3.7 3.7  CL 107 104  CO2 24  --   GLUCOSE 113* 108*  BUN 9 9  CREATININE 0.89 0.80  CALCIUM 9.4  --    GFR: Estimated Creatinine Clearance: 87.6 mL/min (by C-G formula based on SCr of 0.8 mg/dL). Liver Function Tests: Recent Labs  Lab 02/02/19 1925  AST 18  ALT 12  ALKPHOS 60  BILITOT 0.9  PROT 6.3*  ALBUMIN 4.2   No results for input(s): LIPASE, AMYLASE in the last 168 hours. No results for input(s): AMMONIA in the last 168 hours. Coagulation Profile: Recent Labs  Lab 02/02/19 1925  INR 1.2   Cardiac Enzymes: No results for input(s): CKTOTAL, CKMB, CKMBINDEX, TROPONINI in the last 168 hours. BNP (last 3 results) No results for input(s): PROBNP in the last 8760 hours. HbA1C: Recent Labs    02/03/19 0403  HGBA1C 5.9*   CBG: Recent Labs  Lab 02/05/19 0623  GLUCAP 92   Lipid Profile: Recent Labs    02/03/19 0403  CHOL 164  HDL 45  LDLCALC 108*  TRIG 54  CHOLHDL 3.6   Thyroid Function Tests: No results for input(s): TSH, T4TOTAL, FREET4, T3FREE, THYROIDAB in the last 72 hours. Anemia Panel: No results for input(s): VITAMINB12, FOLATE, FERRITIN, TIBC, IRON, RETICCTPCT in the last 72 hours. Sepsis Labs: No results for input(s): PROCALCITON, LATICACIDVEN in the last 168 hours.  Recent Results (from the past 240 hour(s))  SARS Coronavirus 2 (CEPHEID - Performed in [facility] hospital lab), Hosp Order     Status: None   Collection Time: 02/02/19  9:40 PM   Specimen: Nasopharyngeal Swab  Result Value Ref Range Status   SARS Coronavirus 2 NEGATIVE NEGATIVE Final    Comment: (NOTE) If result is NEGATIVE  SARS-CoV-2 target nucleic acids are NOT DETECTED. The SARS-CoV-2 RNA is generally detectable in upper and lower  respiratory specimens during the acute phase of infection. The lowest  concentration of SARS-CoV-2 viral copies this assay can detect is 250  copies / mL. A negative result does not preclude SARS-CoV-2 infection  and should not be used as the sole basis for treatment or other  patient management decisions.  A negative result may occur with  improper specimen collection / handling, submission of specimen other  than nasopharyngeal swab, presence of viral mutation(s) within the  areas targeted by this assay, and inadequate number of viral copies  (<250 copies / mL). A negative result must be combined with clinical  observations, patient history, and epidemiological information. If result is POSITIVE SARS-CoV-2 target nucleic acids are DETECTED. The SARS-CoV-2 RNA is generally detectable in upper and lower  respiratory specimens dur ing the acute phase of infection.  Positive  results are indicative of active infection with SARS-CoV-2.  Clinical  correlation with patient history and other diagnostic information is  necessary to determine patient infection status.  Positive results do  not rule out bacterial infection or co-infection with other viruses. If result is PRESUMPTIVE POSTIVE SARS-CoV-2 nucleic acids MAY BE PRESENT.   A presumptive positive result was obtained on the submitted specimen  and confirmed on repeat testing.  While 2019 novel coronavirus  (SARS-CoV-2) nucleic acids may be present in the submitted sample  additional confirmatory testing may be necessary for epidemiological  and / or clinical management purposes  to differentiate between  SARS-CoV-2 and other Sarbecovirus currently known to infect humans.  If clinically indicated additional testing with an alternate test  methodology [phone]) is advised. The SARS-CoV-2 RNA is generally  detectable in upper  and lower respiratory sp ecimens during the acute  phase of infection. The expected result is Negative. Fact Sheet for Patients:  StrictlyIdeas.no Fact Sheet for Healthcare Providers: BankingDealers.co.za This test is not yet approved or cleared by the [place] FDA and has been authorized for detection and/or diagnosis of SARS-CoV-2 by FDA under an Emergency Use Authorization (EUA).  This EUA will remain in effect (meaning this test can be used) for the duration of the COVID-19 declaration under Section 564(b)(1) of the Act, 21 U.S.C. section 360bbb-3(b)(1), unless the authorization is terminated or revoked sooner. Performed at [facility], [place] [address], [place], [place] [address]          Radiology Studies: No results found.      Scheduled Meds: . apixaban  5 mg Oral BID  . lacosamide  50 mg Oral BID  . levETIRAcetam  1,500 mg Oral BID  . multivitamin with minerals  1 tablet Oral Daily  . sodium chloride flush  3 mL Intravenous Once  . vitamin B-12  500 mcg Oral Daily   Continuous Infusions: . sodium chloride Stopped (02/03/19 0038)     LOS: 2 days    Time spent: 35 mins.More than 50% of that time was spent in counseling and/or coordination of care.      Shelly Coss, MD [facility] Pager [phone]  If 7PM-7AM, please contact night-coverage [email or web address] Password [facility] 02/05/2019, 12:46 PM

## 2019-02-05 NOTE — Progress Notes (Signed)
Inpatient Rehab Admissions Coordinator:   Awaiting word from insurance on prior authorization for possible admit today.  Will follow.   Shann Medal, PT, DPT Admissions Coordinator [phone] 02/05/19  12:29 PM

## 2019-02-05 NOTE — Progress Notes (Signed)
Physical Therapy Treatment Patient Details Name: Christopher Morris MRN: [id number] DOB: [birth year] Today's Date: 02/05/2019    History of Present Illness Patient is a 78 y/o male presenting to the ED on 02/02/2019 with primary complaints of R sided weakness and aphasia. PAst medical history significant of barin glioblastoma (s/p of surgical resection, radiation and chemotherapy), grand mal seizure, PE on Eliquis. CT-head: No sign of ischemic disease. Continued work-up.     PT Comments    Patient is making progress toward PT goals and tolerated session well. Pt continues to require assistance for functional transfer and gait training. Continue to recommend CIR for further skilled PT services to maximize independence and safety with mobility.      Follow Up Recommendations  CIR     Equipment Recommendations  None recommended by PT    Recommendations for Other Services       Precautions / Restrictions Precautions Precautions: Fall Precaution Comments: R inattention/ apraxia Restrictions Weight Bearing Restrictions: No    Mobility  Bed Mobility Overal bed mobility: Needs Assistance Bed Mobility: Supine to Sit     Supine to sit: Min guard;HOB elevated     General bed mobility comments: increased time and effort and cues for sequencing needed; min gaurd for safety  Transfers Overall transfer level: Needs assistance Equipment used: Rolling walker (2 wheeled) Transfers: Sit to/from Stand Sit to Stand: Min assist;Mod assist;+2 physical assistance;+2 safety/equipment         General transfer comment: assist to power up into standing and then for balance once in standing; pt began sitting prematurely and with unsafe use of AD to get from recliner to [facility]   Ambulation/Gait Ambulation/Gait assistance: Min assist;Mod assist Gait Distance (Feet): 90 Feet Assistive device: Rolling walker (2 wheeled) Gait Pattern/deviations: Step-through pattern;Trunk flexed;Wide base of  support;Decreased step length - left;Decreased step length - right;Decreased dorsiflexion - left;Decreased dorsiflexion - right     General Gait Details: cues for increased bilat step lengths; assist for balance and weight shfiting due to L lateral bias   Stairs             Wheelchair Mobility    Modified Rankin (Stroke Patients Only) Modified Rankin (Stroke Patients Only) Pre-Morbid Rankin Score: Moderately severe disability Modified Rankin: Moderately severe disability     Balance Overall balance assessment: Needs assistance Sitting-balance support: Feet supported Sitting balance-Leahy Scale: Fair     Standing balance support: Bilateral upper extremity supported;During functional activity Standing balance-Leahy Scale: Poor                              Cognition Arousal/Alertness: Awake/alert Behavior During Therapy: WFL for tasks assessed/performed Overall Cognitive Status: Impaired/Different from baseline Area of Impairment: Following commands;Safety/judgement;Problem solving                   Current Attention Level: Sustained   Following Commands: Follows one step commands with increased time;Follows one step commands inconsistently Safety/Judgement: Decreased awareness of safety;Decreased awareness of deficits   Problem Solving: Difficulty sequencing;Requires verbal cues;Requires tactile cues;Slow processing        Exercises      General Comments        Pertinent Vitals/Pain Pain Assessment: No/denies pain    Home Living                      Prior Function            PT Goals (  current goals can now be found in the care plan section) Acute Rehab PT Goals Patient Stated Goal: none stated Progress towards PT goals: Progressing toward goals    Frequency    Min 4X/week      PT Plan Current plan remains appropriate    Co-evaluation              AM-PAC PT "6 Clicks" Mobility   Outcome Measure  Help  needed turning from your back to your side while in a flat bed without using bedrails?: A Little Help needed moving from lying on your back to sitting on the side of a flat bed without using bedrails?: A Little Help needed moving to and from a bed to a chair (including a wheelchair)?: A Lot Help needed standing up from a chair using your arms (e.g., wheelchair or bedside chair)?: A Lot Help needed to walk in hospital room?: A Lot Help needed climbing 3-5 steps with a railing? : Total 6 Click Score: 13    End of Session Equipment Utilized During Treatment: Gait belt Activity Tolerance: Patient tolerated treatment well Patient left: with call bell/phone within reach;in chair;with chair alarm set Nurse Communication: Mobility status PT Visit Diagnosis: Unsteadiness on feet (R26.81);Other abnormalities of gait and mobility (R26.89);Muscle weakness (generalized) (M62.81)     Time: 6789-3810 PT Time Calculation (min) (ACUTE ONLY): 37 min  Charges:  $Gait Training: 8-22 mins $Therapeutic Activity: 8-22 mins                     Earney Navy, PTA Acute Rehabilitation Services Pager: [phone] Office: [phone]     Darliss Cheney 02/05/2019, 1:24 PM

## 2019-02-06 ENCOUNTER — Encounter (HOSPITAL_COMMUNITY): Payer: Self-pay | Admitting: *Deleted

## 2019-02-06 ENCOUNTER — Inpatient Hospital Stay (HOSPITAL_COMMUNITY)
Admission: RE | Admit: 2019-02-06 | Discharge: 2019-02-21 | DRG: 945 | Disposition: A | Discharge status: Home / Self Care | Payer: Medicare Other | Source: Intra-hospital | Attending: Physical Medicine & Rehabilitation | Admitting: Physical Medicine & Rehabilitation

## 2019-02-06 DIAGNOSIS — R482 Apraxia: Secondary | ICD-10-CM | POA: Diagnosis present

## 2019-02-06 DIAGNOSIS — Z91013 Allergy to seafood: Secondary | ICD-10-CM | POA: Diagnosis not present

## 2019-02-06 DIAGNOSIS — Z8249 Family history of ischemic heart disease and other diseases of the circulatory system: Secondary | ICD-10-CM

## 2019-02-06 DIAGNOSIS — Z85841 Personal history of malignant neoplasm of brain: Secondary | ICD-10-CM | POA: Diagnosis not present

## 2019-02-06 DIAGNOSIS — G9341 Metabolic encephalopathy: Secondary | ICD-10-CM | POA: Diagnosis present

## 2019-02-06 DIAGNOSIS — R531 Weakness: Secondary | ICD-10-CM | POA: Diagnosis present

## 2019-02-06 DIAGNOSIS — E46 Unspecified protein-calorie malnutrition: Secondary | ICD-10-CM

## 2019-02-06 DIAGNOSIS — Z86711 Personal history of pulmonary embolism: Secondary | ICD-10-CM | POA: Diagnosis not present

## 2019-02-06 DIAGNOSIS — Z86718 Personal history of other venous thrombosis and embolism: Secondary | ICD-10-CM

## 2019-02-06 DIAGNOSIS — Z923 Personal history of irradiation: Secondary | ICD-10-CM

## 2019-02-06 DIAGNOSIS — Z7901 Long term (current) use of anticoagulants: Secondary | ICD-10-CM | POA: Diagnosis not present

## 2019-02-06 DIAGNOSIS — Z6825 Body mass index (BMI) 25.0-25.9, adult: Secondary | ICD-10-CM | POA: Diagnosis not present

## 2019-02-06 DIAGNOSIS — E669 Obesity, unspecified: Secondary | ICD-10-CM | POA: Diagnosis present

## 2019-02-06 DIAGNOSIS — Z9221 Personal history of antineoplastic chemotherapy: Secondary | ICD-10-CM

## 2019-02-06 DIAGNOSIS — Z79899 Other long term (current) drug therapy: Secondary | ICD-10-CM

## 2019-02-06 DIAGNOSIS — Z823 Family history of stroke: Secondary | ICD-10-CM

## 2019-02-06 DIAGNOSIS — R5381 Other malaise: Secondary | ICD-10-CM | POA: Diagnosis present

## 2019-02-06 DIAGNOSIS — E8809 Other disorders of plasma-protein metabolism, not elsewhere classified: Secondary | ICD-10-CM

## 2019-02-06 DIAGNOSIS — R569 Unspecified convulsions: Secondary | ICD-10-CM | POA: Diagnosis not present

## 2019-02-06 DIAGNOSIS — Z91018 Allergy to other foods: Secondary | ICD-10-CM | POA: Diagnosis not present

## 2019-02-06 DIAGNOSIS — G40909 Epilepsy, unspecified, not intractable, without status epilepticus: Secondary | ICD-10-CM

## 2019-02-06 DIAGNOSIS — R4701 Aphasia: Secondary | ICD-10-CM | POA: Diagnosis present

## 2019-02-06 DIAGNOSIS — E441 Mild protein-calorie malnutrition: Secondary | ICD-10-CM | POA: Diagnosis present

## 2019-02-06 LAB — COMPREHENSIVE METABOLIC PANEL
ALT: 11 U/L (ref 0–44)
AST: 14 U/L — ABNORMAL LOW (ref 15–41)
Albumin: 3.8 g/dL (ref 3.5–5.0)
Alkaline Phosphatase: 60 U/L (ref 38–126)
Anion gap: 11 (ref 5–15)
BUN: 10 mg/dL (ref 8–23)
CO2: 25 mmol/L (ref 22–32)
Calcium: 9.3 mg/dL (ref 8.9–10.3)
Chloride: 103 mmol/L (ref 98–111)
Creatinine, Ser: 0.88 mg/dL (ref 0.61–1.24)
GFR calc Af Amer: >60 mL/min (ref >60)
GFR calc non Af Amer: >60 mL/min (ref >60)
Glucose, Bld: 127 mg/dL — ABNORMAL HIGH (ref 70–99)
Potassium: 4 mmol/L (ref 3.5–5.1)
Sodium: 139 mmol/L (ref 135–145)
Total Bilirubin: 1.1 mg/dL (ref 0.3–1.2)
Total Protein: 5.9 g/dL — ABNORMAL LOW (ref 6.5–8.1)

## 2019-02-06 LAB — CBC WITH DIFFERENTIAL/PLATELET
Abs Immature Granulocytes: 0.01 10*3/uL (ref 0.00–0.07)
Basophils Absolute: 0 10*3/uL (ref 0.0–0.1)
Basophils Relative: 0 %
Eosinophils Absolute: 0.2 10*3/uL (ref 0.0–0.5)
Eosinophils Relative: 4 %
HCT: 41.1 % (ref 39.0–52.0)
Hemoglobin: 13.9 g/dL (ref 13.0–17.0)
Immature Granulocytes: 0 %
Lymphocytes Relative: 35 %
Lymphs Abs: 2 10*3/uL (ref 0.7–4.0)
MCH: 29.8 pg (ref 26.0–34.0)
MCHC: 33.8 g/dL (ref 30.0–36.0)
MCV: 88.2 fL (ref 80.0–100.0)
Monocytes Absolute: 0.6 10*3/uL (ref 0.1–1.0)
Monocytes Relative: 10 %
Neutro Abs: 2.9 10*3/uL (ref 1.7–7.7)
Neutrophils Relative %: 51 %
Platelets: 182 10*3/uL (ref 150–400)
RBC: 4.66 MIL/uL (ref 4.22–5.81)
RDW: 12.3 % (ref 11.5–15.5)
WBC: 5.7 10*3/uL (ref 4.0–10.5)
nRBC: 0 % (ref 0.0–0.2)

## 2019-02-06 MED ORDER — ACETAMINOPHEN 160 MG/5ML PO SOLN: 650.0000 mg | ORAL | Status: DC | PRN

## 2019-02-06 MED ORDER — LACOSAMIDE 50 MG PO TABS
50.0000 mg | ORAL_TABLET | Freq: Two times a day (BID) | ORAL | Status: DC
  Administered 2019-02-06 – 2019-02-21 (×30): 50 mg via ORAL
  Filled 2019-02-06 (×30): qty 1

## 2019-02-06 MED ORDER — VITAMIN B-12 1000 MCG PO TABS
500.0000 ug | ORAL_TABLET | Freq: Every day | ORAL | Status: DC
  Administered 2019-02-07 – 2019-02-21 (×15): 500 ug via ORAL
  Filled 2019-02-06 (×15): qty 1

## 2019-02-06 MED ORDER — APIXABAN 5 MG PO TABS
5.0000 mg | ORAL_TABLET | Freq: Two times a day (BID) | ORAL | Status: DC
  Administered 2019-02-06 – 2019-02-21 (×30): 5 mg via ORAL
  Filled 2019-02-06 (×30): qty 1

## 2019-02-06 MED ORDER — SORBITOL 70 % SOLN: 30.0000 mL | Freq: Every day | Status: DC | PRN

## 2019-02-06 MED ORDER — LEVETIRACETAM 750 MG PO TABS
1500.0000 mg | ORAL_TABLET | Freq: Two times a day (BID) | ORAL | Status: DC
  Administered 2019-02-06 – 2019-02-21 (×30): 1500 mg via ORAL
  Filled 2019-02-06 (×30): qty 2

## 2019-02-06 MED ORDER — ADULT MULTIVITAMIN W/MINERALS CH
1.0000 | ORAL_TABLET | Freq: Every day | ORAL | Status: DC
  Administered 2019-02-07: 1 via ORAL
  Filled 2019-02-06: qty 1

## 2019-02-06 MED ORDER — ACETAMINOPHEN 325 MG PO TABS
650.0000 mg | ORAL_TABLET | ORAL | Status: DC | PRN
  Filled 2019-02-06: qty 2

## 2019-02-06 MED ORDER — LEVETIRACETAM 750 MG PO TABS: 1500.0000 mg | ORAL_TABLET | Freq: Two times a day (BID) | ORAL | Status: DC

## 2019-02-06 MED ORDER — SENNOSIDES-DOCUSATE SODIUM 8.6-50 MG PO TABS: 1.0000 | ORAL_TABLET | Freq: Every evening | ORAL | Status: DC | PRN

## 2019-02-06 MED ORDER — LACOSAMIDE 50 MG PO TABS: 50.0000 mg | ORAL_TABLET | Freq: Two times a day (BID) | ORAL | Status: DC

## 2019-02-06 MED ORDER — ACETAMINOPHEN 650 MG RE SUPP: 650.0000 mg | RECTAL | Status: DC | PRN

## 2019-02-06 NOTE — Care Management Important Message (Signed)
Important Message  Patient Details  Name: Christopher Morris MRN: [id number] Date of Birth: 01/12/1941   Medicare Important Message Given:  Yes     Shawnte Winton [address] 02/06/2019, 1:46 PM

## 2019-02-06 NOTE — Discharge Instructions (Addendum)
Inpatient Rehab Discharge Instructions  Christopher Morris Discharge date and time: 02/21/19   Activities/Precautions/ Functional Status: Activity: activity as tolerated and no lifting, driving, or strenuous exercise for till cleared by MD Diet: regular diet Wound Care: none needed    Functional status:  ___ No restrictions     ___ Walk up steps independently _X__ 24/7 supervision/assistance   ___ Walk up steps with assistance ___ Intermittent supervision/assistance  ___ Bathe/dress independently ___ Walk with walker     __X_ Bathe/dress with assistance ___ Walk Independently    ___ Shower independently ___ Walk with assistance    ___ Shower with assistance _X__ No alcohol     ___ Return to work/school ________   COMMUNITY REFERRALS UPON DISCHARGE:   Outpatient: PT     OT  Agency:  [facility]                            [address] [place], [place]  [address]  Phone:   [phone]   Appointment Date/Time:  02-24-19 at 3:30 pm for PT                                                            02-27-19 at 12:30 pm for OT   Medical Equipment/Items Ordered:  20"x20" lightweight wheelchair with elevating leg rests and basic cushion; rolling walker; 3-in-1 commode  Agency/Supplier:  [employer]         Phone:  [phone]  Special Instructions: 1. No smoking   2. Follow with with Dr. Mickeal Skinner about seizure medications    My questions have been answered and I understand these instructions. I will adhere to these goals and the provided educational materials after my discharge from the hospital.  Patient/Caregiver Signature _______________________________ Date __________  Clinician Signature _______________________________________ Date __________  Please bring this form and your medication list with you to all your follow-up doctor's appointments. Information on my medicine - ELIQUIS (apixaban)  This medication  education was reviewed with me or my healthcare representative as part of my discharge preparation.    Why was Eliquis prescribed for you? Eliquis was prescribed to treat blood clots that may have been found in the veins of your legs (deep vein thrombosis) or in your lungs (pulmonary embolism) and to reduce the risk of them occurring again.  What do You need to know about Eliquis ?  Your current dose is ONE 5 mg tablet taken TWICE daily.  Eliquis may be taken with or without food.   Try to take the dose about the same time in the morning and in the evening. If you have difficulty swallowing the tablet whole please discuss with your pharmacist how to take the medication safely.  Take Eliquis exactly as prescribed and DO NOT stop taking Eliquis without talking to the doctor who prescribed the medication.  Stopping may increase your risk of developing a new blood clot.  Refill your prescription before you run out.  After discharge, you should have regular check-up appointments with your healthcare provider that is prescribing your Eliquis.  What do you do if you miss a dose? If a dose of ELIQUIS is not taken at the scheduled time, take it as soon as possible on the same day and twice-daily administration should be resumed. The dose should not be doubled to make up for a missed dose.  Important Safety Information A possible side effect of Eliquis is bleeding. You should call your healthcare provider right away if you experience any of the following: ? Bleeding from an injury or your nose that does not stop. ? Unusual colored urine (red or dark brown) or unusual colored stools (red or black). ? Unusual bruising for unknown reasons. ? A serious fall or if you hit your head (even if there is no bleeding).  Some medicines may interact with Eliquis and might increase your risk of bleeding or clotting while on Eliquis. To help avoid this, consult your healthcare provider or pharmacist  prior to using any new prescription or non-prescription medications, including herbals, vitamins, non-steroidal anti-inflammatory drugs (NSAIDs) and supplements.  This website has more information on Eliquis (apixaban): http://www.eliquis.com/eliquis/home

## 2019-02-06 NOTE — Progress Notes (Signed)
Patient ID: Christopher Morris, male   DOB: 07-02-1941, 79 y.o.   MRN: [id number]  Admitted to unit, oriented to rehab, medications, rehab schedule and plan of care. States an understanding of information reviewed. Margarito Liner

## 2019-02-06 NOTE — Progress Notes (Addendum)
Inpatient Rehab Admissions Coordinator:   I have insurance authorization and a bed available for pt to admit to CIR today.  I await approval from attending.   Addendum: Note discharge order from Dr. Tawanna Solo. I will plan to admit pt to CIR today. I will let pt/family, RN, and CSW know.   Shann Medal, PT, DPT Admissions Coordinator [phone] 02/06/19  9:26 AM

## 2019-02-06 NOTE — Care Management Important Message (Signed)
Important Message  Patient Details  Name: Christopher Morris MRN: [id number] Date of Birth: July 23, 1940   Medicare Important Message Given:  Yes     Wilbert Schouten 02/06/2019, 1:50 PM

## 2019-02-06 NOTE — H&P (Signed)
Physical Medicine and Rehabilitation Admission H&P        Chief Complaint  Patient presents with  . Code Stroke  : HPI: Christopher Morris is an 78 year old right-handed male with history of glioblastoma status post surgical resection radiation and chemotherapy 09/30/2017 per Dr. Sherwood Gambler, seizure disorder maintained on Keppra 1250 mg twice daily followed by Dr. Mickeal Skinner, pulmonary emboli maintained on Eliquis.  Per chart review patient lives with daughter who works nights and there is a 40 year old grandson available during the day.  1 level home one-step to entry.  Used a Rollator for functional mobility prior to admission.  Presented 02/02/2019 with right side weakness and aphasia.  It was noted patient with recent admission 12/29/2018 discharge 12/30/2018 following a seizure and Keppra was adjusted at that time.  COVID negative, chemistries unremarkable.  Cranial CT scan 02/02/2019 no sign of ischemic disease and no change since previous studies of 12/29/2018.  CT angiogram of head and neck no acute vascular findings.  Patient with follow-up MRI again unchanged.  EEG 02/03/2019 showing cortical dysfunction in left hemisphere, maximal left frontocentral region consistent with history of craniotomy no seizure noted.  Neurology follow-up and Keppra was increased to 1500 mg twice daily as well as the addition of Vimpat 50 mg twice daily.  Keppra ongoing for history of pulmonary emboli.  Tolerating a regular diet.  Speech therapy follow-up for mild to moderate aphasia and apraxia discussed with daughter who felt patient was at baseline and speech therapy has signed off.  Therapy evaluations completed and patient was admitted for a comprehensive rehab program.   Review of Systems  Constitutional: Negative for chills and fever.  HENT: Negative for hearing loss.   Eyes: Negative for blurred vision and double vision.  Respiratory: Negative for cough and shortness of breath.   Cardiovascular: Positive for leg  swelling. Negative for chest pain and palpitations.  Gastrointestinal: Positive for constipation. Negative for heartburn, nausea and vomiting.  Genitourinary: Positive for urgency. Negative for dysuria, flank pain and hematuria.  Musculoskeletal: Positive for myalgias.  Skin: Negative for rash.  Neurological: Positive for sensory change, speech change, focal weakness and seizures.  All other systems reviewed and are negative.       Past Medical History:  Diagnosis Date  . Brain cancer ([place])    . Glioblastoma ([place]) 09/25/2017  . Hyperglycemia 09/25/2017  . Seizure ([place]) 09/25/2017        Past Surgical History:  Procedure Laterality Date  . APPLICATION OF CRANIAL NAVIGATION N/A 09/30/2017    Procedure: APPLICATION OF CRANIAL NAVIGATION;  Surgeon: [name], MD;  Location: [place];  Service: Neurosurgery;  Laterality: N/A;  . CRANIOTOMY N/A 09/30/2017    Procedure: CRANIOTOMY FOR RESECTION OF BRAIN TUMOR WITH STEREOTACTIC;  Surgeon: [name], MD;  Location: [place];  Service: Neurosurgery;  Laterality: N/A;  CRANIOTOMY FOR RESECTION OF BRAIN TUMOR WITH STEREOTACTIC        Family History  Problem Relation Age of Onset  . Hypertension Daughter    . Stroke Mother    . Cancer Neg Hx     Social History:  reports that he has never smoked. He has never used smokeless tobacco. He reports that he does not drink alcohol or use drugs. Allergies:  Allergies  Allergen Reactions  . Pork-Derived Products        Patient does not eat pork  . Shellfish Allergy        Patient does not eat shellfish  Medications Prior to Admission  Medication Sig Dispense Refill  . apixaban (ELIQUIS) 5 MG TABS tablet Take 10 mg BID till 12/24/2017, start taking 5 mg BID from 12/25/2017 (Patient taking differently: Take 5 mg by mouth 2 (two) times daily. ) 60 tablet 0  . levETIRAcetam (KEPPRA) 250 MG tablet Take 5 tablets (1,250 mg total) by mouth 2 (two) times daily. 300 tablet 2  . Multiple  Vitamin (MULTIVITAMIN) tablet Take 1 tablet by mouth daily.      . vitamin B-12 (CYANOCOBALAMIN) 500 MCG tablet Take 500 mcg by mouth daily.         Drug Regimen Review Drug regimen was reviewed and remains appropriate with no significant issues identified   Home: Home Living Family/patient expects to be discharged to:: Inpatient rehab Living Arrangements: Children Available Help at Discharge: Family, Available PRN/intermittently Type of Home: House Home Access: Stairs to enter [employer] of Steps: 1 Home Layout: One level Bathroom Shower/Tub: Multimedia programmer: Standard Home Equipment: Environmental consultant - 4 wheels, Shower seat Additional Comments: patient is a poor historian, home environment taken form most recent admission  Lives With: Daughter   Functional History: Prior Function Level of Independence: Needs assistance Gait / Transfers Assistance Needed: Uses rollator for functional mobility ADL's / Homemaking Assistance Needed: reports that he does ADLs with shower seat, children help with IADL   Functional Status:  Mobility: Bed Mobility Overal bed mobility: Needs Assistance Bed Mobility: Supine to Sit Supine to sit: Min guard, HOB elevated Sit to supine: Mod assist, +2 for physical assistance General bed mobility comments: increased time and effort and cues for sequencing needed; min gaurd for safety Transfers Overall transfer level: Needs assistance Equipment used: Rolling walker (2 wheeled) Transfers: Sit to/from Stand Sit to Stand: Min assist, Mod assist, +2 physical assistance, +2 safety/equipment Stand pivot transfers: Mod assist, +2 physical assistance General transfer comment: assist to power up into standing and then for balance once in standing; pt began sitting prematurely and with unsafe use of AD to get from recliner to [facility]  Ambulation/Gait Ambulation/Gait assistance: Min assist, Mod assist Gait Distance (Feet): 90 Feet Assistive device:  Rolling walker (2 wheeled) Gait Pattern/deviations: Step-through pattern, Trunk flexed, Wide base of support, Decreased step length - left, Decreased step length - right, Decreased dorsiflexion - left, Decreased dorsiflexion - right General Gait Details: cues for increased bilat step lengths; assist for balance and weight shfiting due to L lateral bias     ADL: ADL Overall ADL's : Needs assistance/impaired Grooming: Standing, Moderate assistance Grooming Details (indicate cue type and reason): standing with mod A for balance while washing hands without UE support Lower Body Bathing: Cueing for sequencing, Sit to/from stand, Cueing for safety, Sitting/lateral leans, Moderate assistance, Maximal assistance Upper Body Dressing : Moderate assistance, Cueing for safety, Cueing for sequencing Lower Body Dressing: Maximal assistance, Sit to/from stand Lower Body Dressing Details (indicate cue type and reason): donning socks seated EOB in figure four position with cuing to utilized B UEs for task General ADL Comments: Pt standing with min A of 2 for standing balance and able to wash buttocks with L UE   Cognition: Cognition Overall Cognitive Status: Impaired/Different from baseline Arousal/Alertness: Awake/alert Orientation Level: Oriented to person, Oriented to place, Oriented to situation, Disoriented to time(can tell year but not month) Attention: Focused, Sustained Focused Attention: Appears intact Sustained Attention: Appears intact Cognition Arousal/Alertness: Awake/alert Behavior During Therapy: WFL for tasks assessed/performed Overall Cognitive Status: Impaired/Different from baseline Area of Impairment:  Following commands, Safety/judgement, Problem solving Orientation Level: Disoriented to, Time, Situation, Person, Place Current Attention Level: Sustained Following Commands: Follows one step commands with increased time, Follows one step commands inconsistently Safety/Judgement:  Decreased awareness of safety, Decreased awareness of deficits Awareness: Intellectual Problem Solving: Difficulty sequencing, Requires verbal cues, Requires tactile cues, Slow processing General Comments: Pt lethargic and disoriented on arrival to room. Became more alert once seated upright on EOB. Difficult to assess due to: Impaired communication   Physical Exam: Blood pressure 123/75, pulse 63, temperature 97.8 F (36.6 C), temperature source Oral, resp. rate 18, height 5\' 11"  (1.803 m), weight 97.3 kg, SpO2 99 %. Physical Exam  Constitutional: He appears well-developed and well-nourished.  obese  HENT:  Head: Normocephalic and atraumatic.  Eyes: Pupils are equal, round, and reactive to light. EOM are normal.  Neck: Normal range of motion. No thyromegaly present.  Cardiovascular: Normal rate and regular rhythm.  No murmur heard. Respiratory: Effort normal. No respiratory distress. He has no wheezes.  GI: Soft. He exhibits no distension. There is no abdominal tenderness.  Neurological: He is alert.  Oriented to place, name, month, year, almost day (2 off). Pt with processing delays and word finding deficits/apraxia.  RUE 4/5. LUE 4+/5. RLE 3-4/5 prox to distal. LLE 3+ to 4+/5. No focal sensory loss. DTR's trace to 1+     Skin: Skin is warm and dry.  Psychiatric: He has a normal mood and affect. His behavior is normal.      Lab Results Last 48 Hours       Results for orders placed or performed during the hospital encounter of 02/02/19 (from the past 48 hour(s))  Glucose, capillary     Status: None    Collection Time: 02/05/19  6:23 AM  Result Value Ref Range    Glucose-Capillary 92 70 - 99 mg/dL    Comment 1 Notify RN       Imaging Results (Last 48 hours)  No results found.           Medical Problem List and Plan: 1.  Right side weakness with aphasia secondary to acute metabolic encephalopathy with history of glioblastoma with resection.             -admit to  inpatient rehab 2.  Antithrombotics: -DVT/anticoagulation: Eliquis             -antiplatelet therapy: N/A 3. Pain Management: Tylenol as needed 4. Mood: Provide emotional support             -antipsychotic agents: N/A 5. Neuropsych: This patient is not capable of making decisions on his own behalf. 6. Skin/Wound Care: Routine skin checks 7. Fluids/Electrolytes/Nutrition: Routine in and outs with follow-up chemistries 8.  Seizure disorder.  Keppra increased to 1500 mg twice daily and the addition of Vimpat 50 mg twice daily.  EEG negative for seizure.  Patient to follow-up Dr. Mickeal Skinner as outpatient     Post Admission Physician Evaluation: 1. Functional deficits secondary  to debility, encephalopathy. 2. Patient is admitted to receive collaborative, interdisciplinary care between the physiatrist, rehab nursing staff, and therapy team. 3. Patient's level of medical complexity and substantial therapy needs in context of that medical necessity cannot be provided at a lesser intensity of care such as a SNF. 4. Patient has experienced substantial functional loss from his/her baseline which was documented above under the "Functional History" and "Functional Status" headings.  Judging by the patient's diagnosis, physical exam, and functional history, the patient has potential for  functional progress which will result in measurable gains while on inpatient rehab.  These gains will be of substantial and practical use upon discharge  in facilitating mobility and self-care at the household level. 5. Physiatrist will provide 24 hour management of medical needs as well as oversight of the therapy plan/treatment and provide guidance as appropriate regarding the interaction of the two. 6. The Preadmission Screening has been reviewed and patient status is unchanged unless otherwise stated above. 7. 24 hour rehab nursing will assist with bladder management, bowel management, safety, skin/wound care, disease  management, medication administration, pain management and patient education  and help integrate therapy concepts, techniques,education, etc. 8. PT will assess and treat for/with: Lower extremity strength, range of motion, stamina, balance, functional mobility, safety, adaptive techniques and equipment .   Goals are: supervision to min assist. 9. OT will assess and treat for/with: ADL's, functional mobility, safety, upper extremity strength, adaptive techniques and equipment, NMR.   Goals are: supervision to min assist. Therapy may proceed with showering this patient. 10. SLP will assess and treat for/with: n/a.  Goals are: n/a, at baseline. 11. Case Management and Social Worker will assess and treat for psychological issues and discharge planning. 12. Team conference will be held weekly to assess progress toward goals and to determine barriers to discharge. 13. Patient will receive at least 3 hours of therapy per day at least 5 days per week. 14. ELOS: 18-21 days       15. Prognosis:  excellent   I have personally performed a face to face diagnostic evaluation of this patient and formulated the key components of the plan.  Additionally, I have personally reviewed laboratory data, imaging studies, as well as relevant notes and concur with the physician assistant's documentation above.  Meredith Staggers, MD, FAAPMR       Lavon Paganini Pease, PA-C 02/06/2019

## 2019-02-06 NOTE — Progress Notes (Signed)
Meredith Staggers, MD  Physician  Physical Medicine and Rehabilitation  PMR Pre-admission  Signed  Date of Service:  02/05/2019 10:53 AM      Related encounter: ED to Hosp-Admission (Current) from 02/02/2019 in [facility]      Signed         Show:Clear all '[x]'$ Manual'[x]'$ Template'[x]'$ Copied  Added by: '[x]'$ Meredith Staggers, MD'[x]'$ Michel Santee, PT  '[]'$ Hover for details PMR Admission Coordinator Pre-Admission Assessment  Patient: Christopher Morris is an 78 y.o., male MRN: [id number] DOB: Sep 29, 1940 Height: '5\' 11"'$  (180.3 cm) Weight: 97.3 kg  Insurance Information HMO: yes    PPO:      PCP:      IPA:      80/20:      OTHER:  PRIMARY: [employer] Medicare      Policy#: [id number]      Subscriber: patient CM Name: Lorayne Bender      Phone#: [phone] ext [phone]     Fax#:  Pre-Cert#: [id number] authorization given by Lorayne Bender with [facility] Medicare for admission to CIR on 7/24 with f/u due to [place] on 7/30 at fax [phone]      Employer:  Benefits:  Phone #: [phone]     Name:  Eff. Date: 09/14/2018     Deduct: $0      Out of Pocket Max: $3600 (met $485)      Life Max: n/a CIR: $295/days 1-5      SNF: 20 full days Outpatient:     Co-Pay: $30 Home Health: 100%      Co-Pay:  DME: 80%     Co-Pay: 20% Providers: in network   SECONDARY:       Policy#:       Subscriber:  CM Name:       Phone#:      Fax#:  Pre-Cert#:       Employer:  Benefits:  Phone #:      Name:  Eff. Date:      Deduct:       Out of Pocket Max:       Life Max:  CIR:       SNF:  Outpatient:      Co-Pay:  Home Health:       Co-Pay:  DME:      Co-Pay:   Medicaid Application Date:       Case Manager:  Disability Application Date:       Case Worker:   The "Data Collection Information Summary" for patients in Inpatient Rehabilitation Facilities with attached "Privacy Act [place] Records" was provided and verbally reviewed with: Family  Emergency Contact Information         Contact  Information    Name Relation Home Work Sikes Daughter [phone]  [phone]      Current Medical History  Patient Admitting Diagnosis: metabolic encephalopathy, seizures  History of Present Illness: Chandon Lazcano an 78 year old right-handed male with history of glioblastoma status post surgical resection radiation and chemotherapy 09/30/2017 per Dr. Sherwood Gambler, seizure disorder maintained on Keppra 1250 mg twice daily followed by Dr. Mickeal Skinner, pulmonary emboli maintained on Eliquis. Presented 02/02/2019 with right side weakness and aphasia. It was noted patient with recent admission 12/29/2018 discharge 12/30/2018 following a seizure and Keppra was adjusted at that time. COVID negative, chemistries unremarkable. Cranial CT scan 02/02/2019 no sign of ischemic disease and no change since previous studies of 12/29/2018. CT angiogram of head and neck no acute vascular findings. Patient with follow-up MRI  again unchanged. EEG 02/03/2019 showing cortical dysfunction in left hemisphere, maximal left frontocentral region consistent with history of craniotomy no seizure noted. Neurology follow-up and Keppra was increased to 1500 mg twice daily as well as the addition of Vimpat 50 mg twice daily. Keppra ongoing for history of pulmonary emboli. Tolerating a regular diet. Speech therapy follow-up for mild to moderate aphasia and apraxia. SLP discussed with daughter who felt patient was at baseline and speech therapy has signed off. Therapy evaluations completed and patient was recommended for CIR.   Complete NIHSS TOTAL: 3  Patient's medical record from [facility] has been reviewed by the rehabilitation admission coordinator and physician.  Past Medical History      Past Medical History:  Diagnosis Date  . Brain cancer ([place])   . Glioblastoma ([place]) 09/25/2017  . Hyperglycemia 09/25/2017  . Seizure ([place]) 09/25/2017    Family History   family history includes  Hypertension in his daughter; Stroke in his mother.  Prior Rehab/Hospitalizations Has the patient had prior rehab or hospitalizations prior to admission? Yes  Has the patient had major surgery during 100 days prior to admission? No              Current Medications  Current Facility-Administered Medications:  .  0.9 %  sodium chloride infusion, , Intravenous, PRN, Ivor Costa, MD, Stopped at 02/03/19 0038 .  acetaminophen (TYLENOL) tablet 650 mg, 650 mg, Oral, Q4H PRN **OR** acetaminophen (TYLENOL) solution 650 mg, 650 mg, Per Tube, Q4H PRN **OR** acetaminophen (TYLENOL) suppository 650 mg, 650 mg, Rectal, Q4H PRN, Ivor Costa, MD .  apixaban (ELIQUIS) tablet 5 mg, 5 mg, Oral, BID, Ivor Costa, MD, 5 mg at 02/05/19 2320 .  lacosamide (VIMPAT) tablet 50 mg, 50 mg, Oral, BID, Marliss Coots, PA-C, 50 mg at 02/05/19 2319 .  levETIRAcetam (KEPPRA) tablet 1,500 mg, 1,500 mg, Oral, BID, Adhikari, Amrit, MD, 1,500 mg at 02/05/19 2319 .  LORazepam (ATIVAN) injection 1 mg, 1 mg, Intravenous, Q2H PRN, Ivor Costa, MD .  multivitamin with minerals tablet 1 tablet, 1 tablet, Oral, Daily, Ivor Costa, MD, 1 tablet at 02/05/19 1057 .  senna-docusate (Senokot-S) tablet 1 tablet, 1 tablet, Oral, QHS PRN, Ivor Costa, MD .  sodium chloride flush (NS) 0.9 % injection 3 mL, 3 mL, Intravenous, Once, Ivor Costa, MD .  vitamin B-12 (CYANOCOBALAMIN) tablet 500 mcg, 500 mcg, Oral, Daily, Ivor Costa, MD, 500 mcg at 02/05/19 1056  Patients Current Diet:     Diet Order                  Diet - low sodium heart healthy         Diet Heart Room service appropriate? Yes; Fluid consistency: Thin  Diet effective now               Precautions / Restrictions Precautions Precautions: Fall Precaution Comments: R inattention/ apraxia Restrictions Weight Bearing Restrictions: No   Has the patient had 2 or more falls or a fall with injury in the past year? Yes  Prior Activity Level Limited Community  (1-2x/wk): limited outings since diagnosis of glioblastoma and new onset seizures, not driving  Prior Functional Level Self Care: Did the patient need help bathing, dressing, using the toilet or eating? Needed some help  Indoor Mobility: Did the patient need assistance with walking from room to room (with or without device)? Independent  Stairs: Did the patient need assistance with internal or external stairs (with or without device)? Needed  some help  Functional Cognition: Did the patient need help planning regular tasks such as shopping or remembering to take medications? Needed some help  Home Assistive Devices / Equipment Home Equipment: Walker - 4 wheels, Shower seat  Prior Device Use: Indicate devices/aids used by the patient prior to current illness, exacerbation or injury? Walker  Current Functional Level Cognition  Arousal/Alertness: Awake/alert Overall Cognitive Status: Impaired/Different from baseline Difficult to assess due to: Impaired communication Current Attention Level: Sustained Orientation Level: Oriented to person, Oriented to place, Oriented to situation, Disoriented to time(can tell year but not month) Following Commands: Follows one step commands with increased time, Follows one step commands inconsistently Safety/Judgement: Decreased awareness of safety, Decreased awareness of deficits General Comments: Pt lethargic and disoriented on arrival to room. Became more alert once seated upright on EOB. Attention: Focused, Sustained Focused Attention: Appears intact Sustained Attention: Appears intact    Extremity Assessment (includes Sensation/Coordination)  Upper Extremity Assessment: Defer to OT evaluation RUE Deficits / Details: apraxia; proprioception not intact. No able to formally assess but can utilize in functional way with max multimodal cuing to attend to hand RUE Coordination: decreased fine motor, decreased gross motor  Lower Extremity  Assessment: Defer to PT evaluation RLE Deficits / Details: decreased weight shift to this LE in standing; inattention to R LE     ADLs  Overall ADL's : Needs assistance/impaired Grooming: Standing, Moderate assistance Grooming Details (indicate cue type and reason): standing with mod A for balance while washing hands without UE support Lower Body Bathing: Cueing for sequencing, Sit to/from stand, Cueing for safety, Sitting/lateral leans, Moderate assistance, Maximal assistance Upper Body Dressing : Moderate assistance, Cueing for safety, Cueing for sequencing Lower Body Dressing: Maximal assistance, Sit to/from stand Lower Body Dressing Details (indicate cue type and reason): donning socks seated EOB in figure four position with cuing to utilized B UEs for task General ADL Comments: Pt standing with min A of 2 for standing balance and able to wash buttocks with L UE    Mobility  Overal bed mobility: Needs Assistance Bed Mobility: Supine to Sit Supine to sit: Min guard, HOB elevated Sit to supine: Mod assist, +2 for physical assistance General bed mobility comments: increased time and effort and cues for sequencing needed; min gaurd for safety    Transfers  Overall transfer level: Needs assistance Equipment used: Rolling walker (2 wheeled) Transfers: Sit to/from Stand Sit to Stand: Min assist, Mod assist, +2 physical assistance, +2 safety/equipment Stand pivot transfers: Mod assist, +2 physical assistance General transfer comment: assist to power up into standing and then for balance once in standing; pt began sitting prematurely and with unsafe use of AD to get from recliner to [place] / Gait / Stairs / Wheelchair Mobility  Ambulation/Gait Ambulation/Gait assistance: Min assist, Mod assist Gait Distance (Feet): 90 Feet Assistive device: Rolling walker (2 wheeled) Gait Pattern/deviations: Step-through pattern, Trunk flexed, Wide base of support, Decreased step  length - left, Decreased step length - right, Decreased dorsiflexion - left, Decreased dorsiflexion - right General Gait Details: cues for increased bilat step lengths; assist for balance and weight shfiting due to L lateral bias    Posture / Balance Dynamic Sitting Balance Sitting balance - Comments: Able to sit EOB with min A to min guard for safety. Posterior bias, but able to correct with VCs. At times pulling on PTA's hands and leaning backward. Balance Overall balance assessment: Needs assistance Sitting-balance support: Feet supported Sitting balance-Leahy Scale:  Fair Sitting balance - Comments: Able to sit EOB with min A to min guard for safety. Posterior bias, but able to correct with VCs. At times pulling on PTA's hands and leaning backward. Postural control: Posterior lean Standing balance support: Bilateral upper extremity supported, During functional activity Standing balance-Leahy Scale: Poor Standing balance comment: required Mod A +2 for balance    Special needs/care consideration BiPAP/CPAP no CPM no Continuous Drip IV no Dialysis no        Days n/a Life Vest no Oxygen no Special Bed no Trach Size no Wound Vac (area) no      Location n/a Skin intact                           Bowel mgmt: continent, last BM PTA Bladder mgmt: continent Diabetic mgmt: no Behavioral consideration no Chemo/radiation no   Previous Home Environment (from acute therapy documentation) Living Arrangements: Children  Lives With: Daughter Available Help at Discharge: Family, Available PRN/intermittently Type of Home: House Home Layout: One level Home Access: Stairs to enter Technical brewer of Steps: 1 Bathroom Shower/Tub: Multimedia programmer: Standard Home Care Services: No Additional Comments: patient is a poor historian, home environment taken form most recent admission  Discharge Living Setting Plans for Discharge Living Setting: Lives with (comment)(daughter,  [name]) Type of Home at Discharge: House Discharge Home Layout: One level Discharge Home Access: Stairs to enter Entrance Stairs-Rails: None Entrance Stairs-Number of Steps: 1 Discharge Bathroom Shower/Tub: Walk-in shower Discharge Bathroom Toilet: Standard Discharge Bathroom Accessibility: Yes How Accessible: Accessible via walker Does the patient have any problems obtaining your medications?: No  Social/Family/Support Systems Anticipated Caregiver: daughter [name] Anticipated Ambulance person Information: [phone] Ability/Limitations of Caregiver: daughter works nights, but her 56 y/o son is home at night and able to assist as needed Caregiver Availability: 24/7 Discharge Plan Discussed with Primary Caregiver: Yes Is Caregiver In Agreement with Plan?: Yes Does Caregiver/Family have Issues with Lodging/Transportation while Pt is in Rehab?: No  Goals/Additional Needs Patient/Family Goal for Rehab: PT/OT supervision/min assist, SLP on acute spoke with daughter and confirmed speech/cognition is baseline Expected length of stay: 18-21 days Dietary Needs: heart healthy/thin Equipment Needs: tbd Pt/Family Agrees to Admission and willing to participate: Yes Program Orientation Provided & Reviewed with Pt/Caregiver Including Roles  & Responsibilities: Yes  Decrease burden of Care through IP rehab admission: n/a  Possible need for SNF placement upon discharge: not anticipated.  Discussed with pt's daughter, [name], on 7/22 and confirmed that family was providing supervision and assist for pt at baseline and plan to continue at discharge from CIR.   Patient Condition: I have reviewed medical records from [facility], spoken with CM, and patient and daughter. I met with patient at the bedside and discussed with daughtervia phone for inpatient rehabilitation assessment.  Patient will benefit from ongoing PT and OT, can actively participate in 3 hours of therapy a day 5 days of  the week, and can make measurable gains during the admission.  Patient will also benefit from the coordinated team approach during an Inpatient Acute Rehabilitation admission.  The patient will receive intensive therapy as well as Rehabilitation physician, nursing, social worker, and care management interventions.  Due to safety, disease management, medication administration, pain management and patient education the patient requires 24 hour a day rehabilitation nursing.  The patient is currently mod to total with mobility and basic ADLs.  Discharge setting and therapy post discharge at  home with home health is anticipated.  Patient has agreed to participate in the Acute Inpatient Rehabilitation Program and will admit today.  Preadmission Screen Completed By:  Michel Santee, PT, DPT 02/06/2019 10:16 AM ______________________________________________________________________   Discussed status with Dr. Naaman Plummer on 02/06/19  at 10:16 AM  and received approval for admission today.  Admission Coordinator:  Michel Santee, PT, DPT time 10:16 AM/Date 02/06/19    Assessment/Plan: Diagnosis: metabolic encephalopathy 1. Does the need for close, 24 hr/day Medical supervision in concert with the patient's rehab needs make it unreasonable for this patient to be served in a less intensive setting? Yes 2. Co-Morbidities requiring supervision/potential complications: sz, hx of PE, GBM 3. Due to bladder management, bowel management, safety, skin/wound care, disease management, medication administration, pain management and patient education, does the patient require 24 hr/day rehab nursing? Yes 4. Does the patient require coordinated care of a physician, rehab nurse, PT (1-2 hrs/day, 5 days/week) and OT (1-2 hrs/day, 5 days/week) to address physical and functional deficits in the context of the above medical diagnosis(es)? Yes Addressing deficits in the following areas: balance, endurance, locomotion, strength,  transferring, bowel/bladder control, bathing, dressing, feeding, grooming, toileting, cognition and psychosocial support 5. Can the patient actively participate in an intensive therapy program of at least 3 hrs of therapy 5 days a week? Yes 6. The potential for patient to make measurable gains while on inpatient rehab is excellent 7. Anticipated functional outcomes upon discharge from inpatients are: supervision and min assist PT, supervision and min assist OT, n/a SLP 8. Estimated rehab length of stay to reach the above functional goals is: 18-21days 9. Anticipated D/C setting: Home 10. Anticipated post D/C treatments: [place] therapy 11. Overall Rehab/Functional Prognosis: excellent  MD Signature: Meredith Staggers, MD, [place] [facility] 02/06/2019         Revision History

## 2019-02-06 NOTE — Discharge Summary (Signed)
Physician Discharge Summary  Christopher Morris GXQ:[id number] DOB: Jun 24, 1941 DOA: 02/02/2019  [facility]  Admit date: 02/02/2019 Discharge date: 02/06/2019  Admitted From: Home Disposition:  Home  Discharge Condition:Stable CODE STATUS:FULL Diet recommendation: Heart Healthy  Brief/Interim Summary: Patient is a 78 year old male with history of brain glioblastoma status post surgical resection, radiation and chemotherapy, seizure disorder, pulmonary embolism on Eliquis who presented with right-sided weakness.  Differential diagnosis include seizure versus stroke. Neurology was  following.  MRI did not show a stroke.  Seizure medications adjusted. Patient evaluated PT/OT and recommended CIR on discharge. Patient is hemodynamically stable for discharge to CIR today.  Following problems were addressed during his hospitalization:  Right-sided weakness/aphasia/acute metabolic encephalopathy: Etiology unclear at this point.  Differentials included seizure versus stroke.  CT head/CTA negative for acute intracranial abnormalities.  MRI showed unchanged hyperintense T2-weighted signal centered in the left frontal lobe: Possible edema/nonenhancing tumor.  Seen by physical therapy/Occupational Therapy and recommended CIR.  He presented with right-sided weakness but his  weakness has improved. His speech is slow but clear. Neurology was following. Patient remains alert, awake.  After discussing with the daughter today, we knew that he is confused on his baseline.  Seizure disorder: Currently on  Keppra and Vimpat.  EEG done which showed cortical dysfunction in the left hemisphere but no seizure or epileptiform discharge.  Frontal glioblastoma multiforme: Status post craniectomy resection, chemo and radiation.  Patient follows with Dr. Mickeal Skinner  Pulmonary embolism: Currently on Eliquis.  Debility/deconditioning: Patient seen by physical therapy and recommended CIR on  discharge  Discharge Diagnoses:  Principal Problem:   Right sided weakness Active Problems:   Seizure ([place])   Frontal glioblastoma multiforme (HCC)   Pulmonary embolism (HCC)   Acute metabolic encephalopathy   Seizures (HCC)    Discharge Instructions  Discharge Instructions    Diet - low sodium heart healthy   Complete by: As directed    Discharge instructions   Complete by: As directed    1)Take current medications as instructed. 2)Follow up with your oncologist along as an outpatient.   Increase activity slowly   Complete by: As directed      Allergies as of 02/06/2019      Reactions   Pork-derived Products    Patient does not eat pork   Shellfish Allergy    Patient does not eat shellfish      Medication List    TAKE these medications   apixaban 5 MG Tabs tablet Commonly known as: ELIQUIS Take 10 mg BID till 12/24/2017, start taking 5 mg BID from 12/25/2017 What changed:   how much to take  how to take this  when to take this  additional instructions   lacosamide 50 MG Tabs tablet Commonly known as: VIMPAT Take 1 tablet (50 mg total) by mouth 2 (two) times daily.   levETIRAcetam 750 MG tablet Commonly known as: KEPPRA Take 2 tablets (1,500 mg total) by mouth 2 (two) times daily. What changed:   medication strength  how much to take   multivitamin tablet Take 1 tablet by mouth daily.   vitamin B-12 500 MCG tablet Commonly known as: CYANOCOBALAMIN Take 500 mcg by mouth daily.       Allergies  Allergen Reactions  . Pork-Derived Products     Patient does not eat pork  . Shellfish Allergy     Patient does not eat shellfish    Consultations:  Neurology   Procedures/Studies: Ct Angio Head W Or Wo Contrast  Result Date: 02/02/2019 CLINICAL DATA:  Acute presentation with right arm weakness and slurred speech EXAM: CT ANGIOGRAPHY HEAD AND NECK TECHNIQUE: Multidetector CT imaging of the head and neck was performed using the standard  protocol during bolus administration of intravenous contrast. Multiplanar CT image reconstructions and MIPs were obtained to evaluate the vascular anatomy. Carotid stenosis measurements (when applicable) are obtained utilizing NASCET criteria, using the distal internal carotid diameter as the denominator. CONTRAST:  12mL OMNIPAQUE IOHEXOL 350 MG/ML SOLN COMPARISON:  Head CT same day.  MRI and CT studies 12/29/2018 FINDINGS: CTA NECK FINDINGS Aortic arch: Aortic atherosclerosis. No aneurysm or dissection. Branching pattern is normal without origin stenosis. Right carotid system: Common carotid artery widely patent to the bifurcation. Carotid bifurcation shows minimal atherosclerotic disease but no stenosis or irregularity. Cervical ICA is tortuous but widely patent. Left carotid system: Left common carotid artery is widely patent to the bifurcation. Carotid bifurcation is widely patent. Cervical ICA is normal. Vertebral arteries: Both vertebral artery origins are widely patent. Both vertebral arteries are normal through the cervical region to the foramen magnum. Skeleton: Advanced chronic cervical spondylosis as seen previously. Other neck: No soft tissue mass or lymphadenopathy. Upper chest: Negative Review of the MIP images confirms the above findings CTA HEAD FINDINGS Anterior circulation: Both internal carotid arteries are patent through the skull base and siphon regions. Siphon atherosclerotic calcification but no stenosis. The anterior and middle cerebral vessels are patent without proximal stenosis, aneurysm or vascular malformation. No large or medium vessel occlusion. Posterior circulation: Both vertebral arteries are widely patent to the basilar. No basilar stenosis. Posterior circulation branch vessels are patent. Mild irregularity of both PCA vessels as shown previously. Venous sinuses: Patent and normal. Anatomic variants: None significant. Delayed phase: Not performed. Review of the MIP images confirms  the above findings IMPRESSION: No acute vascular finding. No change since 1 month ago. Normal except for minimal nonstenotic atherosclerotic change at the bifurcation regions and siphons and some atherosclerotic narrowing and irregularity of the PCA branches. These results were communicated to Dr. Leonel Ramsay at 7:48 pmon 7/20/2020by text page via the [facility] messaging system. Electronically Signed   By: Nelson Chimes M.D.   On: 02/02/2019 19:50   Ct Angio Neck W Or Wo Contrast  Result Date: 02/02/2019 CLINICAL DATA:  Acute presentation with right arm weakness and slurred speech EXAM: CT ANGIOGRAPHY HEAD AND NECK TECHNIQUE: Multidetector CT imaging of the head and neck was performed using the standard protocol during bolus administration of intravenous contrast. Multiplanar CT image reconstructions and MIPs were obtained to evaluate the vascular anatomy. Carotid stenosis measurements (when applicable) are obtained utilizing NASCET criteria, using the distal internal carotid diameter as the denominator. CONTRAST:  72mL OMNIPAQUE IOHEXOL 350 MG/ML SOLN COMPARISON:  Head CT same day.  MRI and CT studies 12/29/2018 FINDINGS: CTA NECK FINDINGS Aortic arch: Aortic atherosclerosis. No aneurysm or dissection. Branching pattern is normal without origin stenosis. Right carotid system: Common carotid artery widely patent to the bifurcation. Carotid bifurcation shows minimal atherosclerotic disease but no stenosis or irregularity. Cervical ICA is tortuous but widely patent. Left carotid system: Left common carotid artery is widely patent to the bifurcation. Carotid bifurcation is widely patent. Cervical ICA is normal. Vertebral arteries: Both vertebral artery origins are widely patent. Both vertebral arteries are normal through the cervical region to the foramen magnum. Skeleton: Advanced chronic cervical spondylosis as seen previously. Other neck: No soft tissue mass or lymphadenopathy. Upper chest: Negative Review of the MIP  images confirms  the above findings CTA HEAD FINDINGS Anterior circulation: Both internal carotid arteries are patent through the skull base and siphon regions. Siphon atherosclerotic calcification but no stenosis. The anterior and middle cerebral vessels are patent without proximal stenosis, aneurysm or vascular malformation. No large or medium vessel occlusion. Posterior circulation: Both vertebral arteries are widely patent to the basilar. No basilar stenosis. Posterior circulation branch vessels are patent. Mild irregularity of both PCA vessels as shown previously. Venous sinuses: Patent and normal. Anatomic variants: None significant. Delayed phase: Not performed. Review of the MIP images confirms the above findings IMPRESSION: No acute vascular finding. No change since 1 month ago. Normal except for minimal nonstenotic atherosclerotic change at the bifurcation regions and siphons and some atherosclerotic narrowing and irregularity of the PCA branches. These results were communicated to Dr. Leonel Ramsay at 7:48 pmon 7/20/2020by text page via the [facility] messaging system. Electronically Signed   By: Nelson Chimes M.D.   On: 02/02/2019 19:50   Mr Brain Wo Contrast  Result Date: 02/03/2019 EXAM: MRI HEAD WITHOUT CONTRAST TECHNIQUE: Multiplanar, multiecho pulse sequences of the brain and surrounding structures were obtained without intravenous contrast. COMPARISON:  None. FINDINGS: Brain: There is no acute ischemia. There is a large area of hyperintense T2-weighted signal centered within the left frontal lobe, unchanged from the prior study. This is superimposed on diffuse periventricular white matter hyperintensity. No contrast agent was administered on the current examination, limiting comparison to the prior study. There are areas of chronic hemorrhage at the left frontal lobe resection site. There is no midline shift or other mass effect. No acute intracranial hemorrhage. Vascular: The major intracranial  arterial and venous sinus flow voids are preserved. Skull and upper cervical spine: Remote left pterional craniotomy. Sinuses/Orbits: No fluid levels or advanced mucosal thickening of the visualized paranasal sinuses. No mastoid or middle ear effusion. The orbits are normal. Other: None IMPRESSION: 1. Unchanged appearance of large amount of hyperintense T2-weighted signal centered in the left frontal lobe, which may indicate a combination of edema and/or nonenhancing tumor. 2. Comparison otherwise limited by lack of intravenous contrast material on the current study. 3. No acute ischemia. Electronically Signed   By: Ulyses Jarred M.D.   On: 02/03/2019 03:01   Ct Head Code Stroke Wo Contrast  Result Date: 02/02/2019 CLINICAL DATA:  Code stroke. Symptoms of right arm weakness and slurred speech. Glioblastoma. EXAM: CT HEAD WITHOUT CONTRAST TECHNIQUE: Contiguous axial images were obtained from the base of the skull through the vertex without intravenous contrast. COMPARISON:  MRI 12/29/2018.  CT 12/29/2018 FINDINGS: Brain: Previous left frontal craniotomy. 3 cm left frontal mass consistent with glioblastoma difficult to appreciate by noncontrast CT. Extensive vasogenic edema throughout the left hemisphere as seen previously. White matter low density consistent with previous radiation change as seen previously. No sign of acute infarction, hemorrhage, hydrocephalus or extra-axial collection. No midline shift. Vascular: No abnormal vascular finding. Skull: Left frontal craniotomy changes. Sinuses/Orbits: Negative Other: None ASPECTS ([place] Stroke Program Early CT Score) - Ganglionic level infarction (caudate, lentiform nuclei, internal capsule, insula, M1-M3 cortex): 7 - Supraganglionic infarction (M4-M6 cortex): 3 Total score (0-10 with 10 being normal): 10 IMPRESSION: 1. No sign of ischemic disease in this patient with a known left frontal glioblastoma, extensive left frontal vasogenic edema and widespread white  matter changes subsequent radiation. No change since the previous studies of last month. 2. ASPECTS is 10. 3. These results were communicated to Dr. Leonel Ramsay at 7:34 pmon 7/20/2020by text page via the [facility]  messaging system. Electronically Signed   By: Nelson Chimes M.D.   On: 02/02/2019 19:36       Subjective:  Patient seen and examined the bedside this morning.  Hemodynamically stable for discharge today.  Discharge Exam: Vitals:   02/06/19 0416 02/06/19 0739  BP: 123/75 118/69  Pulse: 63 (!) 58  Resp: 18 15  Temp: 97.8 F (36.6 C) (!) 97.5 F (36.4 C)  SpO2: 99% 98%   Vitals:   02/05/19 2105 02/06/19 0005 02/06/19 0416 02/06/19 0739  BP: 131/77 132/79 123/75 118/69  Pulse: 66 67 63 (!) 58  Resp: 18 18 18 15   Temp: 98.2 F (36.8 C) 98.1 F (36.7 C) 97.8 F (36.6 C) (!) 97.5 F (36.4 C)  TempSrc: Oral Oral Oral Oral  SpO2: 100% 98% 99% 98%  Weight:      Height:        General: Pt is alert, awake, not in acute distress Cardiovascular: RRR, S1/S2 +, no rubs, no gallops Respiratory: CTA bilaterally, no wheezing, no rhonchi Abdominal: Soft, NT, ND, bowel sounds + Extremities: no edema, no cyanosis    The results of significant diagnostics from this hospitalization (including imaging, microbiology, ancillary and laboratory) are listed below for reference.     Microbiology: Recent Results (from the past 240 hour(s))  SARS Coronavirus 2 (CEPHEID - Performed in [place] hospital lab), Hosp Order     Status: None   Collection Time: 02/02/19  9:40 PM   Specimen: Nasopharyngeal Swab  Result Value Ref Range Status   SARS Coronavirus 2 NEGATIVE NEGATIVE Final    Comment: (NOTE) If result is NEGATIVE SARS-CoV-2 target nucleic acids are NOT DETECTED. The SARS-CoV-2 RNA is generally detectable in upper and lower  respiratory specimens during the acute phase of infection. The lowest  concentration of SARS-CoV-2 viral copies this assay can detect is 250  copies / mL.  A negative result does not preclude SARS-CoV-2 infection  and should not be used as the sole basis for treatment or other  patient management decisions.  A negative result may occur with  improper specimen collection / handling, submission of specimen other  than nasopharyngeal swab, presence of viral mutation(s) within the  areas targeted by this assay, and inadequate number of viral copies  (<250 copies / mL). A negative result must be combined with clinical  observations, patient history, and epidemiological information. If result is POSITIVE SARS-CoV-2 target nucleic acids are DETECTED. The SARS-CoV-2 RNA is generally detectable in upper and lower  respiratory specimens dur ing the acute phase of infection.  Positive  results are indicative of active infection with SARS-CoV-2.  Clinical  correlation with patient history and other diagnostic information is  necessary to determine patient infection status.  Positive results do  not rule out bacterial infection or co-infection with other viruses. If result is PRESUMPTIVE POSTIVE SARS-CoV-2 nucleic acids MAY BE PRESENT.   A presumptive positive result was obtained on the submitted specimen  and confirmed on repeat testing.  While 2019 novel coronavirus  (SARS-CoV-2) nucleic acids may be present in the submitted sample  additional confirmatory testing may be necessary for epidemiological  and / or clinical management purposes  to differentiate between  SARS-CoV-2 and other Sarbecovirus currently known to infect humans.  If clinically indicated additional testing with an alternate test  methodology [phone]) is advised. The SARS-CoV-2 RNA is generally  detectable in upper and lower respiratory sp ecimens during the acute  phase of infection. The expected result is  Negative. Fact Sheet for Patients:  StrictlyIdeas.no Fact Sheet for Healthcare Providers: BankingDealers.co.za This test is not  yet approved or cleared by the [place] FDA and has been authorized for detection and/or diagnosis of SARS-CoV-2 by FDA under an Emergency Use Authorization (EUA).  This EUA will remain in effect (meaning this test can be used) for the duration of the COVID-19 declaration under Section 564(b)(1) of the Act, 21 U.S.C. section 360bbb-3(b)(1), unless the authorization is terminated or revoked sooner. Performed at [facility], [place] [address], [place], [place] [address]      Labs: BNP (last 3 results) No results for input(s): BNP in the last 8760 hours. Basic Metabolic Panel: Recent Labs  Lab 02/02/19 1925 02/02/19 1931  NA 140 140  K 3.7 3.7  CL 107 104  CO2 24  --   GLUCOSE 113* 108*  BUN 9 9  CREATININE 0.89 0.80  CALCIUM 9.4  --    Liver Function Tests: Recent Labs  Lab 02/02/19 1925  AST 18  ALT 12  ALKPHOS 60  BILITOT 0.9  PROT 6.3*  ALBUMIN 4.2   No results for input(s): LIPASE, AMYLASE in the last 168 hours. No results for input(s): AMMONIA in the last 168 hours. CBC: Recent Labs  Lab 02/02/19 1925 02/02/19 1931  WBC 7.2  --   NEUTROABS 3.3  --   HGB 13.2 13.3  HCT 39.5 39.0  MCV 89.8  --   PLT 169  --    Cardiac Enzymes: No results for input(s): CKTOTAL, CKMB, CKMBINDEX, TROPONINI in the last 168 hours. BNP: Invalid input(s): POCBNP CBG: Recent Labs  Lab 02/05/19 0623  GLUCAP 92   D-Dimer No results for input(s): DDIMER in the last 72 hours. Hgb A1c No results for input(s): HGBA1C in the last 72 hours. Lipid Profile No results for input(s): CHOL, HDL, LDLCALC, TRIG, CHOLHDL, LDLDIRECT in the last 72 hours. Thyroid function studies No results for input(s): TSH, T4TOTAL, T3FREE, THYROIDAB in the last 72 hours.  Invalid input(s): FREET3 Anemia work up No results for input(s): VITAMINB12, FOLATE, FERRITIN, TIBC, IRON, RETICCTPCT in the last 72 hours. Urinalysis    Component Value Date/Time   COLORURINE YELLOW 12/18/2017 0227    APPEARANCEUR CLEAR 12/18/2017 0227   LABSPEC 1.012 12/18/2017 0227   PHURINE 5.0 12/18/2017 0227   GLUCOSEU NEGATIVE 12/18/2017 0227   HGBUR NEGATIVE 12/18/2017 0227   BILIRUBINUR NEGATIVE 12/18/2017 0227   KETONESUR NEGATIVE 12/18/2017 0227   PROTEINUR NEGATIVE 12/18/2017 0227   NITRITE NEGATIVE 12/18/2017 0227   LEUKOCYTESUR NEGATIVE 12/18/2017 0227   Sepsis Labs Invalid input(s): PROCALCITONIN,  WBC,  LACTICIDVEN Microbiology Recent Results (from the past 240 hour(s))  SARS Coronavirus 2 (CEPHEID - Performed in [place] hospital lab), Hosp Order     Status: None   Collection Time: 02/02/19  9:40 PM   Specimen: Nasopharyngeal Swab  Result Value Ref Range Status   SARS Coronavirus 2 NEGATIVE NEGATIVE Final    Comment: (NOTE) If result is NEGATIVE SARS-CoV-2 target nucleic acids are NOT DETECTED. The SARS-CoV-2 RNA is generally detectable in upper and lower  respiratory specimens during the acute phase of infection. The lowest  concentration of SARS-CoV-2 viral copies this assay can detect is 250  copies / mL. A negative result does not preclude SARS-CoV-2 infection  and should not be used as the sole basis for treatment or other  patient management decisions.  A negative result may occur with  improper specimen collection / handling, submission  of specimen other  than nasopharyngeal swab, presence of viral mutation(s) within the  areas targeted by this assay, and inadequate number of viral copies  (<250 copies / mL). A negative result must be combined with clinical  observations, patient history, and epidemiological information. If result is POSITIVE SARS-CoV-2 target nucleic acids are DETECTED. The SARS-CoV-2 RNA is generally detectable in upper and lower  respiratory specimens dur ing the acute phase of infection.  Positive  results are indicative of active infection with SARS-CoV-2.  Clinical  correlation with patient history and other diagnostic information is   necessary to determine patient infection status.  Positive results do  not rule out bacterial infection or co-infection with other viruses. If result is PRESUMPTIVE POSTIVE SARS-CoV-2 nucleic acids MAY BE PRESENT.   A presumptive positive result was obtained on the submitted specimen  and confirmed on repeat testing.  While 2019 novel coronavirus  (SARS-CoV-2) nucleic acids may be present in the submitted sample  additional confirmatory testing may be necessary for epidemiological  and / or clinical management purposes  to differentiate between  SARS-CoV-2 and other Sarbecovirus currently known to infect humans.  If clinically indicated additional testing with an alternate test  methodology [phone]) is advised. The SARS-CoV-2 RNA is generally  detectable in upper and lower respiratory sp ecimens during the acute  phase of infection. The expected result is Negative. Fact Sheet for Patients:  StrictlyIdeas.no Fact Sheet for Healthcare Providers: BankingDealers.co.za This test is not yet approved or cleared by the [place] FDA and has been authorized for detection and/or diagnosis of SARS-CoV-2 by FDA under an Emergency Use Authorization (EUA).  This EUA will remain in effect (meaning this test can be used) for the duration of the COVID-19 declaration under Section 564(b)(1) of the Act, 21 U.S.C. section 360bbb-3(b)(1), unless the authorization is terminated or revoked sooner. Performed at [facility], [place] [address], [place], [place] [address]     Please note: You were cared for by a hospitalist during your hospital stay. Once you are discharged, your primary care physician will handle any further medical issues. Please note that NO REFILLS for any discharge medications will be authorized once you are discharged, as it is imperative that you return to your primary care physician (or establish a relationship with a primary care  physician if you do not have one) for your post hospital discharge needs so that they can reassess your need for medications and monitor your lab values.    Time coordinating discharge: 40 minutes  SIGNED:   Shelly Coss, MD  [facility] 02/06/2019, 10:02 AM Pager [phone]  If 7PM-7AM, please contact night-coverage [email or web address] Password [email or web address]

## 2019-02-07 ENCOUNTER — Inpatient Hospital Stay (HOSPITAL_COMMUNITY): Payer: No Typology Code available for payment source | Admitting: Occupational Therapy

## 2019-02-07 ENCOUNTER — Inpatient Hospital Stay (HOSPITAL_COMMUNITY): Payer: No Typology Code available for payment source | Admitting: Physical Therapy

## 2019-02-07 MED ORDER — ADULT MULTIVITAMIN LIQUID CH
15.0000 mL | Freq: Every day | ORAL | Status: DC
  Administered 2019-02-08 – 2019-02-19 (×12): 15 mL via ORAL
  Filled 2019-02-07 (×13): qty 15

## 2019-02-07 NOTE — Plan of Care (Signed)
  Problem: Consults Goal: RH STROKE PATIENT EDUCATION Description: See Patient Education module for education specifics  Outcome: Progressing   Problem: RH BLADDER ELIMINATION Goal: RH STG MANAGE BLADDER WITH ASSISTANCE Description: STG Manage Bladder With min Assistance Outcome: Progressing   Problem: RH SAFETY Goal: RH STG ADHERE TO SAFETY PRECAUTIONS W/ASSISTANCE/DEVICE Description: STG Adhere to Safety Precautions With supervision cues/reminders. Outcome: Progressing   Problem: RH KNOWLEDGE DEFICIT Goal: RH STG INCREASE KNOWLEDGE OF STROKE PROPHYLAXIS Description: Pt will be able to direct care at discharge with cues/reminders of dtr regarding secondary stroke prophylaxis and prevention of secondary stroke using handouts and educational materials. Outcome: Progressing

## 2019-02-07 NOTE — Evaluation (Signed)
Occupational Therapy Assessment and Plan  Patient Details  Name: Christopher Morris MRN: [id number] Date of Birth: March 19, 1941  OT Diagnosis: apraxia, cognitive deficits, disturbance of vision, hemiplegia affecting dominant side and muscle weakness (generalized) Rehab Potential: Rehab Potential (ACUTE ONLY): Good ELOS: 10-12 days   Today's Date: 02/07/2019 OT Individual Time: 1045-1200 and 1332-1430 OT Individual Time Calculation (min): 75 min  And 58 min  Problem List:  Patient Active Problem List   Diagnosis Date Noted  . Metabolic encephalopathy 41/28/7867  . Seizures ([place]) 02/03/2019  . Right sided weakness 02/02/2019  . Acute metabolic encephalopathy 67/20/9470  . Aphasia 12/29/2018  . Pneumonia 12/22/2017  . HCAP (healthcare-associated pneumonia) 12/22/2017  . Pulmonary embolism ([place]) 12/22/2017  . Tachycardia   . Vitamin B12 deficiency 11/14/2017  . Anemia 11/14/2017  . Fever 11/13/2017  . Dehydration 11/13/2017  . Interstitial pneumonia ([place]) 11/11/2017  . Sepsis ([place]) 11/09/2017  . Bacteremia due to Gram-negative bacteria 10/26/2017  . Sepsis due to Escherichia coli (E. coli) ([place]) 10/26/2017  . UTI (urinary tract infection) 10/25/2017  . Seizure ([place]) 09/25/2017  . Frontal glioblastoma multiforme ([place]) 09/25/2017  . Hyperglycemia 09/25/2017  . BLOOD IN STOOL 07/26/2008  . ERECTILE DYSFUNCTION 07/12/2008    Past Medical History:  Past Medical History:  Diagnosis Date  . Brain cancer ([place])   . Glioblastoma ([place]) 09/25/2017  . Hyperglycemia 09/25/2017  . Seizure ([place]) 09/25/2017   Past Surgical History:  Past Surgical History:  Procedure Laterality Date  . APPLICATION OF CRANIAL NAVIGATION N/A 09/30/2017   Procedure: APPLICATION OF CRANIAL NAVIGATION;  Surgeon: [name], MD;  Location: [facility];  Service: Neurosurgery;  Laterality: N/A;  . CRANIOTOMY N/A 09/30/2017   Procedure: CRANIOTOMY FOR RESECTION OF BRAIN TUMOR WITH STEREOTACTIC;  Surgeon: [name], MD;  Location: [facility];  Service: Neurosurgery;  Laterality: N/A;  CRANIOTOMY FOR RESECTION OF BRAIN TUMOR WITH STEREOTACTIC    Assessment & Plan Clinical Impression:  Christopher Morris an 78 year old right-handed male with history of glioblastoma status post surgical resection radiation and chemotherapy 09/30/2017 per Dr. Sherwood Gambler, seizure disorder maintained on Keppra 1250 mg twice daily followed by Dr. Mickeal Skinner, pulmonary emboli maintained on Eliquis. Per chart review patient lives with daughter who works nights and there is a 30 year old grandson available during the day. 1 level home one-step to entry. Used a Rollator for functional mobility prior to admission. Presented 02/02/2019 with right side weakness and aphasia. It was noted patient with recent admission 12/29/2018 discharge 12/30/2018 following a seizure and Keppra was adjusted at that time. COVID negative, chemistries unremarkable. Cranial CT scan 02/02/2019 no sign of ischemic disease and no change since previous studies of 12/29/2018. CT angiogram of head and neck no acute vascular findings. Patient with follow-up MRI again unchanged. EEG 02/03/2019 showing cortical dysfunction in left hemisphere, maximal left frontocentral region consistent with history of craniotomy no seizure noted. Neurology follow-up and Keppra was increased to 1500 mg twice daily as well as the addition of Vimpat 50 mg twice daily. Keppra ongoing for history of pulmonary emboli. Tolerating a regular diet. Speech therapy follow-up for mild to moderate aphasia and apraxia discussed with daughter who felt patient was at baseline and speech therapy has signed off. Therapy evaluations completed and patient was admitted for a comprehensive rehab program.  Patient currently requires mod with basic self-care skills secondary to muscle weakness, decreased cardiorespiratoy endurance, unbalanced muscle activation, motor apraxia, decreased coordination and decreased motor  planning, decreased visual perceptual skills, decreased attention to right and  right side neglect, decreased attention, decreased problem solving and decreased safety awareness and decreased standing balance, decreased postural control and hemiplegia.  Prior to hospitalization, patient could complete BADLs with modified independent .  Patient will benefit from skilled intervention to increase independence with basic self-care skills prior to discharge home with family.  Anticipate patient will require 24 hour supervision and follow up home health.  OT - End of Session Endurance Deficit: Yes OT Assessment Rehab Potential (ACUTE ONLY): Good OT Barriers to Discharge: Medical stability;Incontinence OT Patient demonstrates impairments in the following area(s): Balance;Cognition;Endurance;Motor;Perception;Safety;Vision OT Basic ADL's Functional Problem(s): Eating;Grooming;Bathing;Dressing;Toileting OT Advanced ADL's Functional Problem(s): Simple Meal Preparation OT Transfers Functional Problem(s): Toilet;Tub/Shower OT Additional Impairment(s): Fuctional Use of Upper Extremity OT Plan OT Intensity: Minimum of 1-2 x/day, 45 to 90 minutes OT Frequency: 5 out of 7 days OT Duration/Estimated Length of Stay: 10-12 days OT Treatment/Interventions: Balance/vestibular training;DME/adaptive equipment instruction;Patient/family education;Therapeutic Activities;Wheelchair propulsion/positioning;Cognitive remediation/compensation;Psychosocial support;Therapeutic Exercise;UE/LE Strength taining/ROM;Self Care/advanced ADL retraining;Functional mobility training;Community reintegration;Discharge planning;Neuromuscular re-education;Skin care/wound managment;UE/LE Coordination activities;Visual/perceptual remediation/compensation;Pain management;Disease mangement/prevention OT Self Feeding Anticipated Outcome(s): Supervision/cuing OT Basic Self-Care Anticipated Outcome(s): Supervision/cuing OT Toileting Anticipated  Outcome(s): Supervision/cuing OT Bathroom Transfers Anticipated Outcome(s): Supervision/cuing OT Recommendation Recommendations for Other Services: Therapeutic Recreation consult Therapeutic Recreation Interventions: Stress management Patient destination: Home Follow Up Recommendations: Home health OT Equipment Recommended: To be determined   Skilled Therapeutic Intervention Skilled OT session completed with focus on initial evaluation, education on OT role/POC, and establishment of patient-centered goals.   Pt greeted in recliner with no c/o pain. Agreeable to shower. He completed bathing (sit<stand from TTB), dressing (w/c level at sink sit<stand), and oral care (standing at sink) during session. Pt completed functional transfers either stand pivot with Min A or ambulation with RW and Min A. Mod A for power ups and controlled lowering onto surfaces. He takes slow shuffling steps and requires vcs for increasing Rt foot clearance. Pt required significantly increased time to motor plan throughout session. Rt inattention and Rt apraxia noted with pt requiring vcs to release grip on items or recognize that he is still holding onto items. Able to use Rt functionally to wash/dry unaffected side and manipulate toothbrush. Seated figure 4 for washing feet. Steady assist-Min A for dynamic standing balance overall. Max A for donning gripper socks for time mgt. At end of session pt was left in bed with all needs within reach.     2nd Session 1:1 tx (58 min) Pt greeted in bed with no c/o pain. NT present taking vitals. Pt was leaning very strongly onto Lt bedrail and unaware of it. Agreeable to put on clothing family brought from home. Supine<sit completed with supervision given increased time. Had him select clothing from [place] hand. Pt with Lt lean and posterior bias when sitting EOB. Noted truncal sway that increased with time. Frequent LOBs towards Lt side when applying lotion to feet and  doffing/donning gripper socks. With verbal cuing he could correct but pt did not initiate balance recoveries himself. Mod A provided for maintaining upright trunk during LB tasks for safety. Mod A sit<stand using RW. With Min A for balance, pt able to use B UEs to elevate pull up and pants over hips. Vcs for full elevation on Rt side. Ambulatory transfer to toilet completed with Mod A for power up, and Min A for ambulation with pt exhibiting short shuffling steps and freezing before threshold of bathroom. Verbal cuing required for safe body positioning in front of toilet. Pt able to  improve controlled lowering when given verbal cuing. He had continent void of bladder and required Min A for clothing mgt on Rt side after. Pt completed handwashing while standing at the sink. Had him read the labels on several ADL items to further assess vision. When he tried to read the odor eliminator spray pt reported "It's in Spanish" but able to read the other labels without issue. Noted increased time required for him to visually scan for things placed on Lt>Rt sides. Pt would benefit from further visual assessment. He wanted to return to bed and did so with supervision for sit<supine and verbal cuing. Pt zipped up suitcase using Rt with Min A to get him started, but then able to do himself. He was left in bed with all needs within reach and bed alarm set.      OT Evaluation Precautions/Restrictions  Precautions Precautions: Fall Precaution Comments: R inattention/ apraxia General Chart Reviewed: Yes Family/Caregiver Present: No Home Living/Prior Functioning Home Living Family/patient expects to be discharged to:: Private residence Living Arrangements: Children Available Help at Discharge: Available 24 hours/day(24/7 supervision will be divided between dtr + grandson) Type of Home: House Home Access: Stairs to enter Technical brewer of Steps: 1 Home Layout: One level Bathroom Shower/Tub: Clinical cytogeneticist: Standard Bathroom Accessibility: Yes Additional Comments: home environment taken form most recent admission. per pt will hace 24/7 supervision  Lives With: Family(Dtr + grandson) IADL History Homemaking Responsibilities: No(Per pt and chart, dtr took care of IADL responsibilities, including driving) Occupation: Retired Type of Occupation: Worked for a [employer]: Watching sports on TV (especially baseball) Prior Function Level of Independence: Requires assistive device for independence, Independent with basic ADLs  Able to [place]?: Yes Driving: No ADL ADL Eating: Not assessed Grooming: Contact guard Where Assessed-Grooming: Standing at sink Upper Body Bathing: Supervision/safety Lower Body Bathing: Contact guard Where Assessed-Lower Body Bathing: Shower Upper Body Dressing: Supervision/safety Where Assessed-Upper Body Dressing: Wheelchair Lower Body Dressing: Moderate assistance Where Assessed-Lower Body Dressing: Sitting at sink Social research officer, government: Moderate assistance Social research officer, government Method: Ambulating(RW) Toileting: Minimal assistance Toilet Transfer: Moderate Assistance Toilet Transfer Method: Ambulating (RW) Vision Baseline Vision/History: No visual deficits Patient Visual Report: No change from baseline Vision Assessment?: Vision impaired- to be further tested in functional context Additional Comments: increased time to locate objects on Rt>Lt sides Perception  Perception: Impaired Inattention/Neglect: Does not attend to right side of body;Does not attend to right visual field Praxis Praxis: Impaired Praxis Impairment Details: Ideation;Motor planning Cognition Overall Cognitive Status: No family/caregiver present to determine baseline cognitive functioning Arousal/Alertness: Awake/alert Orientation Level: Person;Place;Situation Person: Oriented Place: Oriented Situation: Oriented Year:  2020 Month: July Day of Week: Correct Immediate Memory Recall: Sock;Blue;Bed Memory Recall Sock: Without Cue Memory Recall Blue: Without Cue Memory Recall Bed: Without Cue Attention: Sustained Awareness: Impaired Sustained Attention: Impaired(noted increased external distraction during self care tasks) Problem Solving: Impaired Safety/Judgement: Impaired  Sensation Sensation Light Touch: Appears Intact Proprioception: Impaired Detail Proprioception Impaired Details: Impaired RUE Coordination Gross Motor Movements are Fluid and Coordinated: No Fine Motor Movements are Fluid and Coordinated: No Coordination and Movement Description: bradykinetic. Finger Nose Finger Test: Dysmetria bilaterally, slower to complete with Rt compared to Lt Motor  Motor Motor: Motor apraxia;Hemiplegia Motor - Skilled Clinical Observations: bradykinesia Mobility  Mod A ambulatory bathroom transfers using RW Trunk/Postural Assessment  Cervical Assessment Cervical Assessment: Within Functional Limits Thoracic Assessment Thoracic Assessment: Within Functional Limits Lumbar Assessment Lumbar Assessment: Within Functional Limits Postural Control  Postural Control: Lt lean, posterior bias + truncal sway when sitting unsupported during functional tasks  Balance Balance Balance Assessed: Yes Dynamic Sitting Balance Dynamic Sitting - Balance Support: No upper extremity supported;During functional activity Dynamic Sitting - Level of Assistance: Mod assist (donning gripper socks EOB) Dynamic Standing Balance Dynamic Standing - Balance Support: No upper extremity supported Dynamic Standing - Level of Assistance: 4: Min assist(completing perihygiene in the shower) Extremity/Trunk Assessment RUE Assessment RUE Assessment: Exceptions to [facility] Active Range of Motion (AROM) Comments: <45 degrees shoulder flexion, 60 degrees shoulder abduction. With active assist, pt able to reach 90 degrees in both flexion and  abduction. WNL otherwise. General Strength Comments: 3-/5 grossly. Apraxic LUE Assessment LUE Assessment: Within Functional Limits    Refer to Care Plan for Long Term Goals  Recommendations for other services: Therapeutic Recreation  Stress management   Discharge Criteria: Patient will be discharged from OT if patient refuses treatment 3 consecutive times without medical reason, if treatment goals not met, if there is a change in medical status, if patient makes no progress towards goals or if patient is discharged from hospital.  The above assessment, treatment plan, treatment alternatives and goals were discussed and mutually agreed upon: by patient  Skeet Simmer 02/07/2019, 12:35 PM

## 2019-02-07 NOTE — Evaluation (Addendum)
Physical Therapy Assessment and Plan  Patient Details  Name: Kashton Mcartor MRN: [id number] Date of Birth: 1941-07-11  PT Diagnosis: Abnormal posture, Abnormality of gait, Coordination disorder and Impaired cognition Rehab Potential: Good ELOS: 8-10 days   Today's Date: 02/07/2019 PT Individual Time: 0800-0905 PT Individual Time Calculation (min): 65 min    Problem List:  Patient Active Problem List   Diagnosis Date Noted  . Metabolic encephalopathy 40/98/1191  . Seizures ([place]) 02/03/2019  . Right sided weakness 02/02/2019  . Acute metabolic encephalopathy 47/82/9562  . Aphasia 12/29/2018  . Pneumonia 12/22/2017  . HCAP (healthcare-associated pneumonia) 12/22/2017  . Pulmonary embolism ([place]) 12/22/2017  . Tachycardia   . Vitamin B12 deficiency 11/14/2017  . Anemia 11/14/2017  . Fever 11/13/2017  . Dehydration 11/13/2017  . Interstitial pneumonia ([place]) 11/11/2017  . Sepsis ([place]) 11/09/2017  . Bacteremia due to Gram-negative bacteria 10/26/2017  . Sepsis due to Escherichia coli (E. coli) ([place]) 10/26/2017  . UTI (urinary tract infection) 10/25/2017  . Seizure ([place]) 09/25/2017  . Frontal glioblastoma multiforme ([place]) 09/25/2017  . Hyperglycemia 09/25/2017  . BLOOD IN STOOL 07/26/2008  . ERECTILE DYSFUNCTION 07/12/2008    Past Medical History:  Past Medical History:  Diagnosis Date  . Brain cancer ([place])   . Glioblastoma ([place]) 09/25/2017  . Hyperglycemia 09/25/2017  . Seizure ([place]) 09/25/2017   Past Surgical History:  Past Surgical History:  Procedure Laterality Date  . APPLICATION OF CRANIAL NAVIGATION N/A 09/30/2017   Procedure: APPLICATION OF CRANIAL NAVIGATION;  Surgeon: [name], MD;  Location: [facility];  Service: Neurosurgery;  Laterality: N/A;  . CRANIOTOMY N/A 09/30/2017   Procedure: CRANIOTOMY FOR RESECTION OF BRAIN TUMOR WITH STEREOTACTIC;  Surgeon: [name], MD;  Location: [facility];  Service: Neurosurgery;  Laterality: N/A;  CRANIOTOMY FOR RESECTION OF  BRAIN TUMOR WITH STEREOTACTIC    Assessment & Plan Clinical Impression: Patient is a 78 year old right-handed male with history of glioblastoma status post surgical resection radiation and chemotherapy 09/30/2017 per Dr. Sherwood Gambler, seizure disorder maintained on Keppra 1250 mg twice daily followed by Dr. Mickeal Skinner, pulmonary emboli maintained on Eliquis. Per chart review patient lives with daughter who works nights and there is a 1 year old grandson available during the day. 1 level home one-step to entry. Used a Rollator for functional mobility prior to admission. Presented 02/02/2019 with right side weakness and aphasia. It was noted patient with recent admission 12/29/2018 discharge 12/30/2018 following a seizure and Keppra was adjusted at that time. COVID negative, chemistries unremarkable. Cranial CT scan 02/02/2019 no sign of ischemic disease and no change since previous studies of 12/29/2018. CT angiogram of head and neck no acute vascular findings. Patient with follow-up MRI again unchanged. EEG 02/03/2019 showing cortical dysfunction in left hemisphere, maximal left frontocentral region consistent with history of craniotomy no seizure noted. Patient transferred to CIR on 02/06/2019 .   Patient currently requires mod with mobility secondary to muscle weakness and muscle joint tightness, decreased cardiorespiratoy endurance, impaired timing and sequencing and motor apraxia, decreased attention to right and ideational apraxia, decreased initiation, decreased awareness, decreased problem solving, decreased safety awareness and delayed processing and decreased sitting balance, decreased standing balance, decreased postural control and decreased balance strategies.  Prior to hospitalization, patient was modified independent  with mobility and lived with Daughter in a House home.  Home access is 1Stairs to enter.  Patient will benefit from skilled PT intervention to maximize safe functional mobility,  minimize fall risk and decrease caregiver burden for planned discharge home with 24  hour supervision.  Anticipate patient will benefit from follow up [place] at discharge.  PT - End of Session Activity Tolerance: Tolerates 10 - 20 min activity with multiple rests Endurance Deficit: Yes PT Assessment Rehab Potential (ACUTE/IP ONLY): Good PT Barriers to Discharge: Decreased caregiver support;Home environment access/layout PT Patient demonstrates impairments in the following area(s): Balance;Behavior;Endurance;Motor;Safety;Perception PT Transfers Functional Problem(s): Bed Mobility;Bed to Chair;Car;Furniture;Floor;Other (comment) PT Locomotion Functional Problem(s): Ambulation;Wheelchair Mobility;Stairs;Other (comment) PT Plan PT Intensity: Minimum of 1-2 x/day ,45 to 90 minutes PT Frequency: 5 out of 7 days PT Duration Estimated Length of Stay: 7-10 days PT Treatment/Interventions: Ambulation/gait training;Balance/vestibular training;Discharge planning;Cognitive remediation/compensation;Functional electrical stimulation;Community reintegration;DME/adaptive equipment instruction;Neuromuscular re-education;Functional mobility training;Disease management/prevention;Psychosocial support;Splinting/orthotics;Pain management;Patient/family education;Stair training;UE/LE Strength taining/ROM;Therapeutic Exercise;Wheelchair propulsion/positioning;Visual/perceptual remediation/compensation;Therapeutic Activities;UE/LE Coordination activities PT Transfers Anticipated Outcome(s): Supervision assist with LRAD PT Locomotion Anticipated Outcome(s): SUpervision assist with LRAD at [facility] level PT Recommendation Follow Up Recommendations: Home health PT Patient destination: Home Equipment Recommended: To be determined  Skilled Therapeutic Intervention Pt received supine in bed and agreeable to PT. Supine>sit transfer with supervision assist increased time for processing.    PT instructed patient in PT Evaluation  and initiated treatment intervention; see below for results. PT educated patient in [place], rehab potential, rehab goals, and discharge recommendations. Pt performed gait training without AD as listed below and x 176f with min assist with RW; decrease step length, wide BOS, and bil knees flexed in stance, mild adjustment noted with instruction. Pt unable to perform stair management without UE support. Car transfer with mod assist utilizing SLS to access seat with BUE supported on frame. Patient returned to room and left sitting in recliner with call bell in reach and all needs met.   PT Evaluation Precautions/Restrictions Restrictions Weight Bearing Restrictions: No General   Vital SignsTherapy Vitals Temp: 98.5 F (36.9 C) Temp Source: Oral Pulse Rate: 62 Resp: 15 BP: 116/67 Patient Position (if appropriate): Lying Oxygen Therapy SpO2: 96 % O2 Device: Room Air Pain Pain Assessment Pain Scale: 0-10 Pain Score: 0-No pain Home Living/Prior Functioning Home Living Available Help at Discharge: Family;Available PRN/intermittently Type of Home: House Home Access: Stairs to enter [employer] Steps: 1 Home Layout: One level Bathroom Shower/Tub: WMultimedia programmer Standard Additional Comments: home environment taken form most recent admission. per pt will hace 24/7 supervision  Lives With: Daughter Prior Function Level of Independence: Requires assistive device for independence(rollator)  Able to Take Stairs?: Yes Driving: No Vision/Perception  Vision - Assessment Additional Comments: increased time to locate objects on the R occassionally Perception Perception: Impaired Inattention/Neglect: Does not attend to right visual field  Cognition Orientation Level: Oriented to person;Oriented to place;Disoriented to time;Oriented to situation Sensation Sensation Light Touch: Appears Intact Proprioception: Impaired Detail Proprioception Impaired Details:  Impaired RUE Coordination Gross Motor Movements are Fluid and Coordinated: No Coordination and Movement Description: bradykinetic. Motor  Motor Motor: Motor apraxia Motor - Skilled Clinical Observations: bradykinesia  Mobility Bed Mobility Bed Mobility: Rolling Right;Rolling Left;Supine to Sit;Sit to Supine Rolling Right: Supervision/verbal cueing Rolling Left: Supervision/Verbal cueing Supine to Sit: Supervision/Verbal cueing Sit to Supine: Supervision/Verbal cueing Transfers Transfers: Sit to Stand;Stand Pivot Transfers Sit to Stand: Moderate Assistance - Patient 50-74% Stand Pivot Transfers: Moderate Assistance - Patient 50 - 74% Transfer (Assistive device): None Locomotion  Gait Ambulation: Yes Gait Assistance: Moderate Assistance - Patient 50-74% Gait Distance (Feet): 10 Feet Assistive device: None Gait Gait: Yes Gait Pattern: Impaired Gait Pattern: Right steppage;Trunk flexed;Wide base of support Stairs / Additional Locomotion Stairs: No  Architect: Yes Wheelchair Assistance: Minimal assistance - Patient >75% Environmental health practitioner: Both upper extremities Wheelchair Parts Management: Needs assistance Distance: 100  Trunk/Postural Assessment  Cervical Assessment Cervical Assessment: Within Functional Limits Thoracic Assessment Thoracic Assessment: Within Functional Limits Lumbar Assessment Lumbar Assessment: Within Functional Limits Postural Control Postural Control: Deficits on evaluation Righting Reactions: delayed posteriorly Protective Responses: limited in standing  Balance Balance Balance Assessed: Yes Static Sitting Balance Static Sitting - Balance Support: No upper extremity supported Static Sitting - Level of Assistance: 5: Stand by assistance Dynamic Sitting Balance Dynamic Sitting - Balance Support: No upper extremity supported Dynamic Sitting - Level of Assistance: 5: Stand by assistance Static Standing  Balance Static Standing - Balance Support: No upper extremity supported Static Standing - Level of Assistance: 3: Mod assist Dynamic Standing Balance Dynamic Standing - Balance Support: No upper extremity supported Dynamic Standing - Level of Assistance: 2: Max assist Dynamic Standing - Comments: at baseline, pt uses Rollator Extremity Assessment      RLE Assessment RLE Assessment: Within Functional Limits General Strength Comments: grossly 4+/5 to 5/5 LLE Assessment LLE Assessment: Within Functional Limits General Strength Comments: grossly 4+/5 to 5/5    Refer to Care Plan for Long Term Goals  Recommendations for other services: Therapeutic Recreation  Stress management  Discharge Criteria: Patient will be discharged from PT if patient refuses treatment 3 consecutive times without medical reason, if treatment goals not met, if there is a change in medical status, if patient makes no progress towards goals or if patient is discharged from hospital.  The above assessment, treatment plan, treatment alternatives and goals were discussed and mutually agreed upon: by patient  Lorie Phenix 02/07/2019, 9:11 AM

## 2019-02-07 NOTE — Progress Notes (Signed)
Christopher Morris is a 78 y.o. male admitted for CIR yesterday with a history of glioblastoma, status post surgical resection approximately 4 months ago complicated by a seizure disorder.  He has been maintained on Eliquis for pulmonary embolism.  Patient presented to the hospital 4 days ago with worsening right-sided weakness and a aphasia.  Follow-up MRI was unchanged and CT angiogram of the head and neck revealed no acute vascular findings  Past Medical History:  Diagnosis Date  . Brain cancer ([place])   . Glioblastoma ([place]) 09/25/2017  . Hyperglycemia 09/25/2017  . Seizure ([place]) 09/25/2017     Subjective: No new complaints. No new problems.  Remains alert  Objective: Vital signs in last 24 hours: Temp:  [97.5 F (36.4 C)-98.7 F (37.1 C)] 98.5 F (36.9 C) (07/25 0527) Pulse Rate:  [62-71] 62 (07/25 0527) Resp:  [15-18] 15 (07/25 0527) BP: (116-125)/(67-73) 116/67 (07/25 0527) SpO2:  [96 %-100 %] 96 % (07/25 0527) Weight:  [95.6 kg] 95.6 kg (07/24 1532) Weight change:  Last BM Date: 02/06/19  Intake/Output from previous day: 07/24 0701 - 07/25 0700 In: 240 [P.O.:240] Out: 2275 [Urine:2275] Last cbgs: CBG (last 3)  Recent Labs    02/05/19 0623  GLUCAP 92   Patient Vitals for the past 24 hrs:  BP Temp Temp src Pulse Resp SpO2 Height Weight  02/07/19 0527 116/67 98.5 F (36.9 C) Oral 62 15 96 % - -  02/06/19 1954 125/73 98.7 F (37.1 C) Oral 71 17 100 % - -  02/06/19 1537 122/67 98 F (36.7 C) Oral 67 17 97 % - -  02/06/19 1532 - - - - - - 6\' 4"  (1.93 m) 95.6 kg     Physical Exam General: No apparent distress   HEENT: not dry Lungs: Normal effort. Lungs clear to auscultation, no crackles or wheezes. Cardiovascular: Regular rate and rhythm, no edema Abdomen: S/NT/ND; BS(+) Musculoskeletal:  unchanged Neurological: No new neurological deficits with generalized weakness, right greater than left Wounds: N/A    Skin: clear   Mental state: Alert, oriented,  cooperative    Lab Results: BMET    Component Value Date/Time   NA 139 02/06/2019 1605   NA 136 (A) 10/18/2017   K 4.0 02/06/2019 1605   CL 103 02/06/2019 1605   CO2 25 02/06/2019 1605   GLUCOSE 127 (H) 02/06/2019 1605   BUN 10 02/06/2019 1605   BUN 19 10/18/2017   CREATININE 0.88 02/06/2019 1605   CREATININE 0.95 10/08/2018 1137   CALCIUM 9.3 02/06/2019 1605   GFRNONAA >60 02/06/2019 1605   GFRNONAA >60 10/08/2018 1137   GFRAA >60 02/06/2019 1605   GFRAA >60 10/08/2018 1137   CBC    Component Value Date/Time   WBC 5.7 02/06/2019 1605   RBC 4.66 02/06/2019 1605   HGB 13.9 02/06/2019 1605   HGB 14.1 10/08/2018 1137   HCT 41.1 02/06/2019 1605   PLT 182 02/06/2019 1605   PLT 216 10/08/2018 1137   MCV 88.2 02/06/2019 1605   MCH 29.8 02/06/2019 1605   MCHC 33.8 02/06/2019 1605   RDW 12.3 02/06/2019 1605   LYMPHSABS 2.0 02/06/2019 1605   MONOABS 0.6 02/06/2019 1605   EOSABS 0.2 02/06/2019 1605   BASOSABS 0.0 02/06/2019 1605     Medications: I have reviewed the patient's current medications.  Assessment/Plan:  Functional deficits with right-sided weakness and aphasia secondary to acute metabolic encephalopathy.  Continue CIR History of glioblastoma multiform.  Status post resection Anticoagulation/DVT prophylaxis.  Continue Eliquis  Seizure disorder.  Continue Keppra    Length of stay, days: 1  Marletta Lor , MD 02/07/2019, 9:04 AM

## 2019-02-08 ENCOUNTER — Inpatient Hospital Stay (HOSPITAL_COMMUNITY): Payer: No Typology Code available for payment source | Admitting: Physical Therapy

## 2019-02-08 LAB — GLUCOSE, CAPILLARY: Glucose-Capillary: 91 mg/dL (ref 70–99)

## 2019-02-08 NOTE — Progress Notes (Signed)
Christopher Morris is a 78 y.o. male admitted for CIR 2 days ago after presenting with worsening right-sided weakness and a aphasia in the setting of surgical resection of a glioblastoma 4 months ago  Past Medical History:  Diagnosis Date  . Brain cancer ([place])   . Glioblastoma ([place]) 09/25/2017  . Hyperglycemia 09/25/2017  . Seizure ([place]) 09/25/2017     Subjective: No new complaints. No new problems.  Alert and offering no complaints  Objective: Vital signs in last 24 hours: Temp:  [97.6 F (36.4 C)-97.9 F (36.6 C)] 97.9 F (36.6 C) (07/26 0513) Pulse Rate:  [63-73] 63 (07/26 0513) Resp:  [18] 18 (07/26 0513) BP: (124-144)/(73-84) 124/73 (07/26 0513) SpO2:  [96 %-100 %] 98 % (07/26 0513) Weight change:  Last BM Date: 02/07/19  Intake/Output from previous day: 07/25 0701 - 07/26 0700 In: 960 [P.O.:960] Out: 800 [Urine:800]  Patient Vitals for the past 24 hrs:  BP Temp Pulse Resp SpO2  02/08/19 0513 124/73 97.9 F (36.6 C) 63 18 98 %  02/07/19 1951 128/75 97.6 F (36.4 C) 73 18 96 %  02/07/19 1329 (!) 144/84 97.6 F (36.4 C) 70 18 100 %     Physical Exam General: No apparent distress   HEENT: Moist mucosal membranes Lungs: Normal effort. Lungs clear to auscultation, no crackles or wheezes. Cardiovascular: Regular rate and rhythm, no edema Abdomen: S/NT/ND; BS(+) Musculoskeletal:  unchanged Neurological: No new neurological deficits with generalized weakness more pronounced on the right Wounds: N/A    Skin: clear   Mental state: Alert, oriented, cooperative    Lab Results: BMET    Component Value Date/Time   NA 139 02/06/2019 1605   NA 136 (A) 10/18/2017   K 4.0 02/06/2019 1605   CL 103 02/06/2019 1605   CO2 25 02/06/2019 1605   GLUCOSE 127 (H) 02/06/2019 1605   BUN 10 02/06/2019 1605   BUN 19 10/18/2017   CREATININE 0.88 02/06/2019 1605   CREATININE 0.95 10/08/2018 1137   CALCIUM 9.3 02/06/2019 1605   GFRNONAA >60 02/06/2019 1605   GFRNONAA >60  10/08/2018 1137   GFRAA >60 02/06/2019 1605   GFRAA >60 10/08/2018 1137   CBC    Component Value Date/Time   WBC 5.7 02/06/2019 1605   RBC 4.66 02/06/2019 1605   HGB 13.9 02/06/2019 1605   HGB 14.1 10/08/2018 1137   HCT 41.1 02/06/2019 1605   PLT 182 02/06/2019 1605   PLT 216 10/08/2018 1137   MCV 88.2 02/06/2019 1605   MCH 29.8 02/06/2019 1605   MCHC 33.8 02/06/2019 1605   RDW 12.3 02/06/2019 1605   LYMPHSABS 2.0 02/06/2019 1605   MONOABS 0.6 02/06/2019 1605   EOSABS 0.2 02/06/2019 1605   BASOSABS 0.0 02/06/2019 1605     Medications: I have reviewed the patient's current medications.  Assessment/Plan:  Functional deficits with right-sided weakness with aphasia secondary to acute metabolic encephalopathy.  Continue CIR Status post resection for glioblastoma multiform he Secondary seizure disorder.  Stable continue Keppra Anticoagulation/DVT prophylaxis.  Continue Eliquis    Length of stay, days: 2  Marletta Lor , MD 02/08/2019, 8:42 AM

## 2019-02-08 NOTE — Progress Notes (Signed)
Physical Therapy Session Note  Patient Details  Name: Christopher Morris MRN: [id number] Date of Birth: 21-Apr-1941  Today's Date: 02/08/2019 PT Individual Time: 0900-1010 PT Individual Time Calculation (min): 70 min   Short Term Goals: Week 1:  PT Short Term Goal 1 (Week 1): STG=LTG due to ELOS  Skilled Therapeutic Interventions/Progress Updates:  Pt was seen bedside in the am. Pt transferred supine to edge of bed with c/g and verbal cues with increased time. Pt tolerated edge of bed with S. Pt transferred sit to stand with min A and verbal cues with rolling walker and increased time. Pt transferred to w/c with min A and verbal cues. Pt propelled w/c about 50 feet with B LEs and min A. Pt performed multiple sit to stand transfers in gym with rolling walker and min A. Pt ambulated 50 feet x 2 with rolling walker and c/g to min A with verbal cues. Pt performed step taps 3 sets x 10 reps each. Pt propelled w/c back towards room with B LEs and S about 10 feet. Pt performed toilet transfers with c/g to min A and verbal cues. Pt ambulated 10 and 20 feet with rolling walker and c/g to min A with verbal cues. Pt left sitting up in recliner with chair alarm in place and call bell within reach.   Therapy Documentation Precautions:  Precautions Precautions: Fall Precaution Comments: R inattention/ apraxia Restrictions Weight Bearing Restrictions: No General:   Pain: No c/o pain.   Therapy/Group: Individual Therapy  Dub Amis 02/08/2019, 12:26 PM

## 2019-02-09 ENCOUNTER — Inpatient Hospital Stay (HOSPITAL_COMMUNITY): Payer: No Typology Code available for payment source | Admitting: Occupational Therapy

## 2019-02-09 ENCOUNTER — Inpatient Hospital Stay (HOSPITAL_COMMUNITY): Payer: No Typology Code available for payment source | Admitting: Physical Therapy

## 2019-02-09 NOTE — Progress Notes (Signed)
[facility] Individual Statement of Services  Patient Name:  Christopher Morris  Date:  02/09/2019  Welcome to the [facility].  Our goal is to provide you with an individualized program based on your diagnosis and situation, designed to meet your specific needs.  With this comprehensive rehabilitation program, you will be expected to participate in at least 3 hours of rehabilitation therapies Monday-Friday, with modified therapy programming on the weekends.  Your rehabilitation program will include the following services:  Physical Therapy (PT), Occupational Therapy (OT), 24 hour per day rehabilitation nursing, Case Management (Social Worker), Rehabilitation Medicine, Nutrition Services and Pharmacy Services  Weekly team conferences will be held on Tuesdays to discuss your progress.  Your Social Worker will talk with you frequently to get your input and to update you on team discussions.  Team conferences with you and your family in attendance may also be held.  Expected length of stay:  8 to 12 days  Overall anticipated outcome:  Supervision  Depending on your progress and recovery, your program may change. Your Social Worker will coordinate services and will keep you informed of any changes. Your Social Worker's name and contact numbers are listed  below.  The following services may also be recommended but are not provided by the [facility] will be made to provide these services after discharge if needed.  Arrangements include referral to agencies that provide these services.  Your insurance has been verified to be:  [employer] Your primary doctor is:  [facility] in [place]  Pertinent information will be shared with your doctor and [employer].  Social Worker:  Alfonse Alpers, LCSW  [phone] or [phone]  Information discussed with and copy given to patient by: Trey Sailors, 02/09/2019, 8:17 PM

## 2019-02-09 NOTE — Progress Notes (Signed)
Social Work Assessment and Plan   Patient Details  Name: Christopher Morris MRN: [id number] Date of Birth: [birth year]  Today's Date: 02/09/2019  Problem List:  Patient Active Problem List   Diagnosis Date Noted  . Metabolic encephalopathy 74/02/1447  . Seizures ([place]) 02/03/2019  . Right sided weakness 02/02/2019  . Acute metabolic encephalopathy 18/56/3149  . Aphasia 12/29/2018  . Pneumonia 12/22/2017  . HCAP (healthcare-associated pneumonia) 12/22/2017  . Pulmonary embolism ([place]) 12/22/2017  . Tachycardia   . Vitamin B12 deficiency 11/14/2017  . Anemia 11/14/2017  . Fever 11/13/2017  . Dehydration 11/13/2017  . Interstitial pneumonia ([place]) 11/11/2017  . Sepsis ([place]) 11/09/2017  . Bacteremia due to Gram-negative bacteria 10/26/2017  . Sepsis due to Escherichia coli (E. coli) ([place]) 10/26/2017  . UTI (urinary tract infection) 10/25/2017  . Seizure ([place]) 09/25/2017  . Frontal glioblastoma multiforme ([place]) 09/25/2017  . Hyperglycemia 09/25/2017  . BLOOD IN STOOL 07/26/2008  . ERECTILE DYSFUNCTION 07/12/2008   Past Medical History:  Past Medical History:  Diagnosis Date  . Brain cancer ([place])   . Glioblastoma ([place]) 09/25/2017  . Hyperglycemia 09/25/2017  . Seizure ([place]) 09/25/2017   Past Surgical History:  Past Surgical History:  Procedure Laterality Date  . APPLICATION OF CRANIAL NAVIGATION N/A 09/30/2017   Procedure: APPLICATION OF CRANIAL NAVIGATION;  Surgeon: [name], MD;  Location: [facility];  Service: Neurosurgery;  Laterality: N/A;  . CRANIOTOMY N/A 09/30/2017   Procedure: CRANIOTOMY FOR RESECTION OF BRAIN TUMOR WITH STEREOTACTIC;  Surgeon: [name], MD;  Location: [facility];  Service: Neurosurgery;  Laterality: N/A;  CRANIOTOMY FOR RESECTION OF BRAIN TUMOR WITH STEREOTACTIC   Social History:  reports that he has never smoked. He has never used smokeless tobacco. He reports that he does not drink alcohol or use drugs.  Family / Support Systems Marital Status:  Divorced Patient Roles: Parent, Other (Comment)(grandparent; retired Corporate treasurer) Children: Toma Arts - dtr - [phone] Anticipated Caregiver: daughter, [name] Ability/Limitations of Caregiver: daughter works nights, but her 17 y/o son is home at night and able to assist as needed Caregiver Availability: 24/7 Family Dynamics: close, supportive dtr and grandson  Social History Preferred language: English Religion: Non-Denominational Cultural Background: Retired Psychologist, prison and probation services: [place]: Yes Write: Yes Employment Status: Retired(retired truck Geophysicist/field seismologist, Sales promotion account executive, Civil engineer, contracting) Public relations account executive Issues: none reported Guardian/Conservator: MD has determined that pt is not capable of making his own decisions.  His dtr would be next of kin for decision making.   Abuse/Neglect Abuse/Neglect Assessment Can Be Completed: Yes Physical Abuse: Denies Verbal Abuse: Denies Sexual Abuse: Denies Exploitation of patient/patient's resources: Denies Self-Neglect: Denies  Emotional Status Pt's affect, behavior and adjustment status: Pt was pleasant and agreeable to talk with CSW and reports feeling okay emotionally with everything he's going through medically. Recent Psychosocial Issues: Pt with glioblastoma and seizures, now with metabolic encephalopathy. Psychiatric History: none reported Substance Abuse History: none reported  Patient / Family Perceptions, Expectations & Goals Pt/Family understanding of illness & functional limitations: Pt/family have a good understanding of pt's condition and limitations. Premorbid pt/family roles/activities: Pt likes to do outside chores.. Anticipated changes in roles/activities/participation: Pt hopes to be able to do these outdoor tasks again. Pt/family expectations/goals: Pt hopes to regain strength and get home.  Community [employer] Agencies: None Premorbid Home Care/DME Agencies: Other (Comment)(Pt has "4  wheeled walker" and shower seat.) Transportation available at discharge: dtr Resource referrals recommended: Neuropsychology  Discharge Planning Living Arrangements: Children Support Systems: Children, Other  relatives, Friends/neighbors Type of Residence: Private residence Insurance Resources: Multimedia programmer (specify)(United Electrical engineer) Financial Resources: Social Security, Family Support Financial Screen Referred: No Living Expenses: Lives with family Money Management: Family Does the patient have any problems obtaining your medications?: No Home Management: Pt, dtr, and grandson share responsibilities. Patient/Family Preliminary Plans: Pt plans to return home where he lives with his dtr.  Yolanda Bonine will be with pt when dtr works at night. Social Work Anticipated Follow Up Needs: HH/OP Expected length of stay: 8-12 days  Clinical Impression CSW met with pt to introduce self and role of CSW, as well as to complete assessment.  Then, CSW called pt's dtr and left her a message to do the same.  She called CSW back and left a message acknowledging message and that she awaits CSW's phone call after conference tomorrow.  Pt was pleasant with CSW and stated that he is feeling well emotionally and that he doesn't get worried about things, just takes things as they come.  No current needs/questions/concerns noted at this time.  CSW will continue to follow and assist as needed.  Eligah Anello, Silvestre Mesi 02/09/2019, 11:01 PM

## 2019-02-09 NOTE — Progress Notes (Signed)
Physical Therapy Session Note  Patient Details  Name: Christopher Morris MRN: [id number] Date of Birth: 08-18-1940  Today's Date: 02/09/2019 PT Individual Time: 0800-0900 PT Individual Time Calculation (min): 60 min   Short Term Goals: Week 1:  PT Short Term Goal 1 (Week 1): STG=LTG due to ELOS  Skilled Therapeutic Interventions/Progress Updates:  Pt received in bed & agreeable to tx. Pt transfers to sitting EOB with supervision, hospital bed features & extra time. Therapist provides total assist for donning ted hose, socks, & shoes for time management. Pt transfers bed>w/c on R with mod assist with max cuing for sequencing and assistance to safely lower into chair as pt with very poor eccentric control. Transported pt to/from gym via w/c dependent assist for time management. Pt transfers sit<>stand 2/2 significant posterior lean with sit>stand and VERY poor eccentric control with stand>sit despite cuing for hand placement and safe lowering. Pt ambulates 100 ft x 2 with RW & min assist with cuing to look up as pt consistently looking at floor, cuing for attention to environment & obstacle avoidance, and max cuing for increased step length BLE and tactile cuing/slight manual facilitation for weight shifting L to allow increased step length & foot clearance RLE. Pt with occasionally revert to shuffling gait pattern & require cuing for longer step length BLE. Pt performed 5x sit<>stand with focus on anterior weight shifting, hand placement and eccentric lowering with pt requiring max cuing for each transfer 2/2 decreased awareness/recall. Pt then noted to be incontinent of urine so returned to room & transferred to standing at sink to allow therapist to change briefs when pt reported need for BM. Pt transported into bathroom via w/c dependent assist and transferred onto toilet with min assist and max cuing, with use of grab bar. Pt left on toilet in care of nurse.   Therapy Documentation Precautions:   Precautions Precautions: Fall Precaution Comments: R inattention/ apraxia Restrictions Weight Bearing Restrictions: No   Pain: Pt denies c/o pain.    Therapy/Group: Individual Therapy  [name] 02/09/2019, 9:03 AM

## 2019-02-09 NOTE — Progress Notes (Signed)
[facility] PHYSICAL MEDICINE & REHABILITATION PROGRESS NOTE   Subjective/Complaints: No new issues. Had a fair night. Up with therapy  ROS: Limited due to cognitive/behavioral    Objective:   No results found. Recent Labs    02/06/19 1605  WBC 5.7  HGB 13.9  HCT 41.1  PLT 182   Recent Labs    02/06/19 1605  NA 139  K 4.0  CL 103  CO2 25  GLUCOSE 127*  BUN 10  CREATININE 0.88  CALCIUM 9.3    Intake/Output Summary (Last 24 hours) at 02/09/2019 0945 Last data filed at 02/08/2019 2300 Gross per 24 hour  Intake 598 ml  Output 50 ml  Net 548 ml     Physical Exam: Vital Signs Blood pressure 122/65, pulse 66, temperature 98.3 F (36.8 C), temperature source Oral, resp. rate 18, height 6\' 4"  (1.93 m), weight 95.6 kg, SpO2 98 %. Constitutional: No distress . Vital signs reviewed. HEENT: EOMI, oral membranes moist Neck: supple Cardiovascular: RRR without murmur. No JVD    Respiratory: CTA Bilaterally without wheezes or rales. Normal effort    GI: BS +, non-tender, non-distended   Neurological: He isalert. Oriented to person and place.  Delayed in responses somewhat. RUE 4/5. LUE 4+/5. RLE 3-4/5 prox to distal. LLE 3+ to 4+/5. No focal sensory loss. DTR's trace to 1+  Psych: pleasant and cooperative   Assessment/Plan: 1. Functional deficits secondary to metabolic encephalopathy which require 3+ hours per day of interdisciplinary therapy in a comprehensive inpatient rehab setting.  Physiatrist is providing close team supervision and 24 hour management of active medical problems listed below.  Physiatrist and rehab team continue to assess barriers to discharge/monitor patient progress toward functional and medical goals  Care Tool:  Bathing    Body parts bathed by patient: Right arm, Left arm, Chest, Abdomen, Front perineal area, Buttocks, Right upper leg, Left upper leg, Right lower leg, Left lower leg, Face         Bathing assist Assist Level: Contact  Guard/Touching assist     Upper Body Dressing/Undressing Upper body dressing   What is the patient wearing?: Pull over shirt    Upper body assist Assist Level: Moderate Assistance - Patient 50 - 74%    Lower Body Dressing/Undressing Lower body dressing      What is the patient wearing?: Incontinence brief     Lower body assist Assist for lower body dressing: Moderate Assistance - Patient 50 - 74%     Toileting Toileting    Toileting assist Assist for toileting: Minimal Assistance - Patient > 75%     Transfers Chair/bed transfer  Transfers assist     Chair/bed transfer assist level: Moderate Assistance - Patient 50 - 74%     Locomotion Ambulation   Ambulation assist      Assist level: Moderate Assistance - Patient 50 - 74% Assistive device: Walker-rolling Max distance: 100 ft   Walk 10 feet activity   Assist     Assist level: Moderate Assistance - Patient - 50 - 74% Assistive device: Walker-rolling   Walk 50 feet activity   Assist Walk 50 feet with 2 turns activity did not occur: Safety/medical concerns  Assist level: Moderate Assistance - Patient - 50 - 74% Assistive device: Walker-rolling    Walk 150 feet activity   Assist Walk 150 feet activity did not occur: Safety/medical concerns         Walk 10 feet on uneven surface  activity   Assist Walk  10 feet on uneven surfaces activity did not occur: Safety/medical concerns         Wheelchair     Assist   Type of Wheelchair: Manual    Wheelchair assist level: Supervision/Verbal cueing Max wheelchair distance: 100    Wheelchair 50 feet with 2 turns activity    Assist        Assist Level: Supervision/Verbal cueing   Wheelchair 150 feet activity     Assist Wheelchair 150 feet activity did not occur: Safety/medical concerns         Medical Problem List and Plan: 1.Right side weakness with aphasiasecondary to acute metabolic encephalopathy with history  of glioblastoma with resection. --Continue CIR therapies including PT, OT, and SLP  2. Antithrombotics: -DVT/anticoagulation:Eliquis -antiplatelet therapy: N/A 3. Pain Management:Tylenol as needed 4. Mood:Provide emotional support -antipsychotic agents: N/A 5. Neuropsych: This patientis notcapable of making decisions on hisown behalf.  6. Skin/Wound Care:continue local care as needed  7. Fluids/Electrolytes/Nutrition:encourage PO  -I personally reviewed the patient's labs today.   8. Seizure disorder. Keppra increased to 1500 mg twice daily and the addition of Vimpat 50 mg twice daily. EEG negative for seizure. Patient to follow-up Dr. Mickeal Skinner as outpatient  -no seizure activity reported since admit to rehab    LOS: 3 days A FACE TO [place] 02/09/2019, 9:45 AM

## 2019-02-09 NOTE — IPOC Note (Signed)
Overall Plan of Care [facility]) Patient Details Name: Lavar Rosenzweig MRN: [id number] DOB: [birth year]  Admitting Diagnosis: <principal problem not specified>  Hospital Problems: Active Problems:   Metabolic encephalopathy     Functional Problem List: Nursing Endurance, Medication Management, Safety, Bladder  PT Balance, Behavior, Endurance, Motor, Safety, Perception  OT Balance, Cognition, Endurance, Motor, Perception, Safety, Vision  SLP    TR         Basic ADL's: OT Eating, Grooming, Bathing, Dressing, Toileting     Advanced  ADL's: OT Simple Meal Preparation     Transfers: PT Bed Mobility, Bed to Chair, Car, Furniture, Floor, Other (comment)  OT Toilet, Tub/Shower     Locomotion: PT Ambulation, Wheelchair Mobility, Stairs, Other (comment)     Additional Impairments: OT Fuctional Use of Upper Extremity  SLP        TR      Anticipated Outcomes Item Anticipated Outcome  Self Feeding Supervision/cuing  Swallowing      Basic self-care  Supervision/cuing  Toileting  Supervision/cuing   Bathroom Transfers Supervision/cuing  Bowel/Bladder  Manage bladder with min assist  Transfers  Supervision assist with LRAD  Locomotion  SUpervision assist with LRAD at [facility] level  Communication     Cognition     Pain  n/a  Safety/Judgment  Maintain safety with cues/reminders   Therapy Plan: PT Intensity: Minimum of 1-2 x/day ,45 to 90 minutes PT Frequency: 5 out of 7 days PT Duration Estimated Length of Stay: 7-10 days OT Intensity: Minimum of 1-2 x/day, 45 to 90 minutes OT Frequency: 5 out of 7 days OT Duration/Estimated Length of Stay: 10-12 days     Due to the current state of emergency, patients may not be receiving their 3-hours of Medicare-mandated therapy.   Team Interventions: Nursing Interventions Bladder Management, Patient/Family Education, Medication Management, Disease Management/Prevention, Discharge Planning  PT interventions Ambulation/gait  training, Balance/vestibular training, Discharge planning, Cognitive remediation/compensation, Functional electrical stimulation, Community reintegration, DME/adaptive equipment instruction, Neuromuscular re-education, Functional mobility training, Disease management/prevention, Psychosocial support, Splinting/orthotics, Pain management, Patient/family education, Stair training, UE/LE Strength taining/ROM, Therapeutic Exercise, Wheelchair propulsion/positioning, Visual/perceptual remediation/compensation, Therapeutic Activities, UE/LE Coordination activities  OT Interventions Training and development officer, DME/adaptive equipment instruction, Patient/family education, Therapeutic Activities, Wheelchair propulsion/positioning, Cognitive remediation/compensation, Psychosocial support, Therapeutic Exercise, UE/LE Strength taining/ROM, Self Care/advanced ADL retraining, Functional mobility training, Community reintegration, Discharge planning, Neuromuscular re-education, Skin care/wound managment, UE/LE Coordination activities, Visual/perceptual remediation/compensation, Pain management, Disease mangement/prevention  SLP Interventions    TR Interventions    SW/CM Interventions Discharge Planning, Psychosocial Support, Patient/Family Education   Barriers to Discharge MD  Medical stability  Nursing      PT Decreased caregiver support, Home environment access/layout    OT Medical stability, Incontinence    SLP      SW       Team Discharge Planning: Destination: PT-Home ,OT- Home , SLP-  Projected Follow-up: PT-Home health PT, OT-  Home health OT, SLP-  Projected Equipment Needs: PT-To be determined, OT- To be determined, SLP-  Equipment Details: PT- , OT-  Patient/family involved in discharge planning: PT- Patient,  OT-Patient, SLP-   MD ELOS: 9-10 days Medical Rehab Prognosis:  Excellent Assessment: The patient has been admitted for CIR therapies with the diagnosis of metabolic encephalopathy. The  team will be addressing functional mobility, strength, stamina, balance, safety, adaptive techniques and equipment, self-care, bowel and bladder mgt, patient and caregiver education, NMR, community reentry. Goals have been set at supervision for mobility, self-care. Pt with baseline cognitive deficits so SLP  was not asked to see pt.   Due to the current state of emergency, patients may not be receiving their 3 hours per day of Medicare-mandated therapy.    Meredith Staggers, MD, FAAPMR      See Team Conference Notes for weekly updates to the plan of care

## 2019-02-09 NOTE — Progress Notes (Signed)
Slept good. Incontinent of urine x3 this shift. Attempted timed toileting. Doesn't call when wet, but will let staff know that he's wet, when they enter his room. Suction and seizure pads in place for seizure precautions. Christopher Morris A

## 2019-02-09 NOTE — Progress Notes (Signed)
Occupational Therapy Session Note  Patient Details  Name: Christopher Morris MRN: [id number] Date of Birth: 17-Jul-1940  Today's Date: 02/09/2019 OT Individual Time: 0945-1100 OT Individual Time Calculation (min): 75 min    Short Term Goals: Week 1:  OT Short Term Goal 1 (Week 1): Pt will complete shower transfers with Min A using LRAD OT Short Term Goal 2 (Week 1): Pt will complete 2 grooming tasks while standing at the sink with supervision for balance OT Short Term Goal 3 (Week 1): Pt will complete toilet transfers with Min A using LRAD  Skilled Therapeutic Interventions/Progress Updates:      Pt seen for BADL retraining of toileting, bathing, and dressing with a focus on motor planning and R side awareness.  Pt was able to rise to stand with min A but with mod cues for safe technique.  He did ambulate to BR with RW but he drags behind his R foot due to inattention and needs constant cuing for safe technique.  He sat on toilet but would benefit from an elevated seat and then transferred to tub bench.  Improved awareness to wash UB with S and don a shirt with min A. He actively used R hand to wash L foot.  Mod A to to don pants over feet to fully attend to R side, in standing he could pull over hips with mod A but needs cues for balance.   Pt requested to sit in recliner at end of session with all needs met.  Belt alarm on.  Therapy Documentation Precautions:  Precautions Precautions: Fall Precaution Comments: R inattention/ apraxia Restrictions Weight Bearing Restrictions: No   Pain: Pain Assessment Pain Scale: 0-10 Pain Score: 0-No pain ADL: ADL Eating: Not assessed Grooming: Contact guard Where Assessed-Grooming: Standing at sink Upper Body Bathing: Supervision/safety Lower Body Bathing: Contact guard Where Assessed-Lower Body Bathing: Shower Upper Body Dressing: Supervision/safety Where Assessed-Upper Body Dressing: Wheelchair Lower Body Dressing: Moderate assistance Where  Assessed-Lower Body Dressing: Edge of bed Toileting: Minimal assistance Where Assessed-Toileting: Glass blower/designer: Moderate assistance Toilet Transfer Method: Ambulating(RW) Social research officer, government: Moderate assistance Social research officer, government Method: Ambulating(RW)   Therapy/Group: Individual Therapy  [place] 02/09/2019, 8:30 AM

## 2019-02-09 NOTE — Progress Notes (Signed)
Occupational Therapy Session Note  Patient Details  Name: Christopher Morris MRN: [id number] Date of Birth: 1940/07/23  Today's Date: 02/09/2019 OT Individual Time: 1300-1400 OT Individual Time Calculation (min): 60 min    Short Term Goals: Week 1:  OT Short Term Goal 1 (Week 1): Pt will complete shower transfers with Min A using LRAD OT Short Term Goal 2 (Week 1): Pt will complete 2 grooming tasks while standing at the sink with supervision for balance OT Short Term Goal 3 (Week 1): Pt will complete toilet transfers with Min A using LRAD  Skilled Therapeutic Interventions/Progress Updates:    Treatment session with focus on functional transfers, sitting balance, and motor planning with gross and fine motor movements.  Pt received upright in recliner agreeable to therapy session.  Mod assist sit > stand and max multimodal cues for stand pivot transfer to w/c.  Pt required increased time during mobility due to decreased motor planning.  Stand pivot transfer w/c <> therapy mat with RW with improved sequencing and upright posture, continuing to require cues for stepping pattern.  Engaged in peg board pattern replication with mod cues to utilize RUE.  Increased time and min question cues for problem solving and correction of errors.  Pt demonstrating increased difficulty when reaching up with RUE, reporting "heavy" in shoulder.  Pt demonstrating good manipulation of large pegs; with increased difficulty when releasing pegs back in to container.  Returned to room and transferred back to bed Mod assist stand pivot with RW, left with all needs in reach.  Therapy Documentation Precautions:  Precautions Precautions: Fall Precaution Comments: R inattention/ apraxia Restrictions Weight Bearing Restrictions: No Pain: Pain Assessment Pain Score: 0-No pain   Therapy/Group: Individual Therapy  Simonne Come 02/09/2019, 2:59 PM

## 2019-02-10 ENCOUNTER — Inpatient Hospital Stay (HOSPITAL_COMMUNITY): Payer: Medicare Other | Admitting: Physical Therapy

## 2019-02-10 ENCOUNTER — Inpatient Hospital Stay (HOSPITAL_COMMUNITY): Payer: Medicare Other

## 2019-02-10 ENCOUNTER — Inpatient Hospital Stay (HOSPITAL_COMMUNITY): Payer: Medicare Other | Admitting: Occupational Therapy

## 2019-02-10 NOTE — Progress Notes (Signed)
Occupational Therapy Session Note  Patient Details  Name: Christopher Morris MRN: [id number] Date of Birth: [birth year]  Today's Date: 02/10/2019 OT Individual Time: 1500-1610 OT Individual Time Calculation (min): 70 min    Short Term Goals: Week 1:  OT Short Term Goal 1 (Week 1): Pt will complete shower transfers with Min A using LRAD OT Short Term Goal 2 (Week 1): Pt will complete 2 grooming tasks while standing at the sink with supervision for balance OT Short Term Goal 3 (Week 1): Pt will complete toilet transfers with Min A using LRAD  Skilled Therapeutic Interventions/Progress Updates:    Pt received sleeping in the recliner with no c/o pain. Pt completed stand pivot transfer to w/c with increased time for sequencing and mod A. Pt was transported to the [place] room where he completed functional reaching with the R UE. Pt required tactile cues d/t L UE frequently initiating reaching instead of R. Pt completed 3x 2 min with min-mod HOH support required to reach buttons above 40 degrees of shoulder flexion. Pt was transported to therapy gym where he completed another SPT with RW to the therapy gym. Less time required this transfer to sequence, but mod A still required. Pt completed standing level functional reaching with R UE crossing midline, with LUE holding onto RW. Pt required min A for balance support throughout. Pt completed activity once more with focus on reaching and attending to R visual field only. Min cueing required. Pt completed 15 ft of functional mobility with min A and mod cueing for RW management. Pt returned to room and indicated need for bathroom. Pt completed ambulatory transfer into bathroom with min A and required min balance support during standing level clothing management. Pt voided bowel and then completed peri hygiene in standing with CGA. With heavy support of grab bar pt was able to stand with only min A from low toilet. Pt completed clothing management with min A. Pt  returned to supine in bed and was left with all needs met, bed alarm set.   Therapy Documentation Precautions:  Precautions Precautions: Fall Precaution Comments: R inattention/ apraxia Restrictions Weight Bearing Restrictions: No  Therapy/Group: Individual Therapy  [name] 02/10/2019, 2:38 PM

## 2019-02-10 NOTE — Plan of Care (Signed)
  Problem: Consults Goal: RH STROKE PATIENT EDUCATION Description: See Patient Education module for education specifics  Outcome: Progressing   Problem: RH BLADDER ELIMINATION Goal: RH STG MANAGE BLADDER WITH ASSISTANCE Description: STG Manage Bladder With min Assistance Outcome: Progressing   Problem: RH SAFETY Goal: RH STG ADHERE TO SAFETY PRECAUTIONS W/ASSISTANCE/DEVICE Description: STG Adhere to Safety Precautions With supervision cues/reminders. Outcome: Progressing   Problem: RH KNOWLEDGE DEFICIT Goal: RH STG INCREASE KNOWLEDGE OF STROKE PROPHYLAXIS Description: Pt will be able to direct care at discharge with cues/reminders of dtr regarding secondary stroke prophylaxis and prevention of secondary stroke using handouts and educational materials. Outcome: Progressing

## 2019-02-10 NOTE — Progress Notes (Signed)
[place] PHYSICAL MEDICINE & REHABILITATION PROGRESS NOTE   Subjective/Complaints: Pt in BR with therapy. No problems overnigt  ROS: Patient denies fever, rash, sore throat, blurred vision, nausea, vomiting, diarrhea, cough, shortness of breath or chest pain, joint or back pain, headache, or mood change.      Objective:   No results found. No results for input(s): WBC, HGB, HCT, PLT in the last 72 hours. No results for input(s): NA, K, CL, CO2, GLUCOSE, BUN, CREATININE, CALCIUM in the last 72 hours.  Intake/Output Summary (Last 24 hours) at 02/10/2019 0905 Last data filed at 02/10/2019 0700 Gross per 24 hour  Intake 360 ml  Output -  Net 360 ml     Physical Exam: Vital Signs Blood pressure 131/79, pulse 72, temperature 98.3 F (36.8 C), temperature source Oral, resp. rate 18, height 6\' 4"  (1.93 m), weight 95.6 kg, SpO2 96 %. Constitutional: No distress . Vital signs reviewed. HEENT: EOMI, oral membranes moist Neck: supple Cardiovascular: RRR without murmur. No JVD    Respiratory: CTA Bilaterally without wheezes or rales. Normal effort    GI: BS +, non-tender, non-distended  Neurological: He isalert. Oriented to person and place.  Slow to process. RUE 4/5. LUE 4+/5. RLE 3-4/5 prox to distal. LLE 3+ to 4+/5. No focal sensory loss. DTR's trace to 1+  Psych: pleasant   Assessment/Plan: 1. Functional deficits secondary to metabolic encephalopathy which require 3+ hours per day of interdisciplinary therapy in a comprehensive inpatient rehab setting.  Physiatrist is providing close team supervision and 24 hour management of active medical problems listed below.  Physiatrist and rehab team continue to assess barriers to discharge/monitor patient progress toward functional and medical goals  Care Tool:  Bathing    Body parts bathed by patient: Right arm, Left arm, Chest, Abdomen, Front perineal area, Buttocks, Right upper leg, Left upper leg, Right lower leg, Left lower  leg, Face         Bathing assist Assist Level: Contact Guard/Touching assist     Upper Body Dressing/Undressing Upper body dressing   What is the patient wearing?: Pull over shirt    Upper body assist Assist Level: Moderate Assistance - Patient 50 - 74%    Lower Body Dressing/Undressing Lower body dressing      What is the patient wearing?: Incontinence brief     Lower body assist Assist for lower body dressing: Moderate Assistance - Patient 50 - 74%     Toileting Toileting    Toileting assist Assist for toileting: Moderate Assistance - Patient 50 - 74%(assist with urinal when needed)     Transfers Chair/bed transfer  Transfers assist     Chair/bed transfer assist level: Moderate Assistance - Patient 50 - 74%     Locomotion Ambulation   Ambulation assist      Assist level: Moderate Assistance - Patient 50 - 74% Assistive device: Walker-rolling Max distance: 100 ft   Walk 10 feet activity   Assist     Assist level: Moderate Assistance - Patient - 50 - 74% Assistive device: Walker-rolling   Walk 50 feet activity   Assist Walk 50 feet with 2 turns activity did not occur: Safety/medical concerns  Assist level: Moderate Assistance - Patient - 50 - 74% Assistive device: Walker-rolling    Walk 150 feet activity   Assist Walk 150 feet activity did not occur: Safety/medical concerns         Walk 10 feet on uneven surface  activity   Assist Walk 10 feet  on uneven surfaces activity did not occur: Safety/medical concerns         Wheelchair     Assist   Type of Wheelchair: Manual    Wheelchair assist level: Supervision/Verbal cueing Max wheelchair distance: 100    Wheelchair 50 feet with 2 turns activity    Assist        Assist Level: Supervision/Verbal cueing   Wheelchair 150 feet activity     Assist Wheelchair 150 feet activity did not occur: Safety/medical concerns         Medical Problem List and  Plan: 1.Right side weakness with aphasiasecondary to acute metabolic encephalopathy with history of glioblastoma with resection. --Continue CIR therapies including PT, OT, and SLP   -team conf 2. Antithrombotics: -DVT/anticoagulation:Eliquis -antiplatelet therapy: N/A 3. Pain Management:Tylenol as needed 4. Mood:Provide emotional support -antipsychotic agents: N/A 5. Neuropsych: This patientis notentirely capable of making decisions on hisown behalf.  6. Skin/Wound Care:continue local care as needed  7. Fluids/Electrolytes/Nutrition:encourage PO      8. Seizure disorder. Keppra increased to 1500 mg twice daily and the addition of Vimpat 50 mg twice daily. EEG negative for seizure. Patient to follow-up Dr. Mickeal Skinner as outpatient  -no seizure activity reported since admit to rehab    LOS: 4 days A FACE TO [place] 02/10/2019, 9:05 AM

## 2019-02-10 NOTE — Progress Notes (Signed)
Occupational Therapy Session Note  Patient Details  Name: Christopher Morris MRN: [id number] Date of Birth: [birth year]  Today's Date: 02/10/2019 OT Individual Time: 2233-6122 OT Individual Time Calculation (min): 74 min    Short Term Goals: Week 1:  OT Short Term Goal 1 (Week 1): Pt will complete shower transfers with Min A using LRAD OT Short Term Goal 2 (Week 1): Pt will complete 2 grooming tasks while standing at the sink with supervision for balance OT Short Term Goal 3 (Week 1): Pt will complete toilet transfers with Min A using LRAD  Skilled Therapeutic Interventions/Progress Updates:    Patient in bed, ready for therapy session.  He is alert, pleasant and cooperative.  Cues for initiation and carryover of technique.   Bed mobility:  Supine to SSP with min A.  Mobility:  Sit to stand min/mod A cues for weight shift forward, ambulation with RW min A cues for right step length and weight shift.  SPT to/from bed, w/c, shower bench, recliner with min A.  ADL:  Shower/bathing with min A for buttocks in stance, LB dressing mod A, footwear mod A, UB dressing set up/supervision, oral care set up.  Completed standing balance/tolerance activity at table top with CS/CG for 15 minutes.  Patient seated in recliner at close of session with seat belt alarm set and call bell/tray table in reach.    Therapy Documentation Precautions:  Precautions Precautions: Fall Precaution Comments: R inattention/ apraxia Restrictions Weight Bearing Restrictions: No General:   Vital Signs:   Pain: Pain Assessment Pain Scale: 0-10 Pain Score: 0-No pain Other Treatments:     Therapy/Group: Individual Therapy  Carlos Levering 02/10/2019, 12:03 PM

## 2019-02-10 NOTE — Progress Notes (Addendum)
Physical Therapy Session Note  Patient Details  Name: Christopher Morris MRN: [id number] Date of Birth: [birth year]  Today's Date: 02/10/2019 PT Individual Time: 2094-7096 PT Individual Time Calculation (min): 58 min   Short Term Goals: Week 1:  PT Short Term Goal 1 (Week 1): STG=LTG due to ELOS  Skilled Therapeutic Interventions/Progress Updates:  Pt received in recliner & agreeable to tx. Pt transfers sit<>stand with mod assist with cuing for anterior weight shifting and to push up from armrests vs pulling to stand. Pt ambulates recliner>w/c with mod assist with MAX cuing for gait pattern but poor return demo as pt continues to demonstrate very wide BOS and shuffled gait with minimal step length BLE. Pt demonstrates poor awareness of body and need to turn to pivot to w/c seat and has very poor eccentric control. Transported pt to dayroom via w/c dependent assist for time management. Pt transfers sit<>stand with mod assist and ambulates with RUE HHA and initially min assist with pt demonstrating more normalized step length BLE but pt requiring up to mod/max assist as pt begins to shuffle feet with poor foot clearance and minimal step length BLE, decreased weight shifting, and beginning to drag RLE 2/2 R inattention and pt with poor awareness of gait impairments. Pt ambulates 50 ft + 50 ft with RUE HHA, requiring seated rest break in between trials.  Pt with absent intellectual awareness regarding current/new deficits and therapist provides max education. Pt requires multiple attempts for sit>stand 2/2 posterior LOB and transfers w/c<>nu-step with stand pivot mod assist with MAX cuing and pt demonstrating significant R inattention. When therapist states "put your R hand over there" pt continues to be unable to perform task without tactile cuing & assistance for placing RUE. Pt requires max cuing for sequencing stand pivot and demonstrates impaired motor planning for weight shifting, advancing BLE, and R  inattention. Pt utilized nu-step on level 2 x 10 minutes with all four extremities, then BLE & RUE only, with activity focusing on coordination of reciprocal movements, R attention & NMR, focusing on "big" movements, and global strengthening. Pt utilized dynavision from w/c level with RUE only with focus on attention to RUE and NMR via forced use. Pt initially attempts to press lights with LUE but is able to self correct before doing so and switches to using RUE. Pt requires assistance from therapist to elevate RUE shoulder & elbow to allow pt to press lights & pt with impaired ability to push lights one extremity is elevated enough. At end of session pt left in recliner with chair alarm donned & all needs in reach.   Therapy Documentation Precautions:  Precautions Precautions: Fall Precaution Comments: R inattention/ apraxia Restrictions Weight Bearing Restrictions: No     Therapy/Group: Individual Therapy  [name] 02/10/2019, 12:16 PM

## 2019-02-11 ENCOUNTER — Inpatient Hospital Stay (HOSPITAL_COMMUNITY): Payer: Medicare Other

## 2019-02-11 ENCOUNTER — Inpatient Hospital Stay (HOSPITAL_COMMUNITY): Payer: Medicare Other | Admitting: Occupational Therapy

## 2019-02-11 ENCOUNTER — Inpatient Hospital Stay (HOSPITAL_COMMUNITY): Payer: Medicare Other | Admitting: Physical Therapy

## 2019-02-11 LAB — GLUCOSE, CAPILLARY: Glucose-Capillary: 111 mg/dL — ABNORMAL HIGH (ref 70–99)

## 2019-02-11 NOTE — Progress Notes (Signed)
Occupational Therapy Session Note  Patient Details  Name: Christopher Morris MRN: [id number] Date of Birth: 05/14/1941  Today's Date: 02/11/2019 OT Individual Time: 3149-7026 OT Individual Time Calculation (min): 71 min    Short Term Goals: Week 1:  OT Short Term Goal 1 (Week 1): Pt will complete shower transfers with Min A using LRAD OT Short Term Goal 2 (Week 1): Pt will complete 2 grooming tasks while standing at the sink with supervision for balance OT Short Term Goal 3 (Week 1): Pt will complete toilet transfers with Min A using LRAD  Skilled Therapeutic Interventions/Progress Updates:    Session focused on morning ADL routine. Pt completed bed mobility with min A to EOB. Pt completed sit > stand with mod A for steadying. Cueing for UE placement. Pt completed ambulatory transfer into bathroom with slow, intentional movement, requiring cueing for increasing stride length, esp with R LE. Pt completed toilet with excellent eccentric control when lowering, with use of L grab bar. Pt attempted to void BM but was unable, however residue was on toilet paper. Pt transferred off toilet with min A and into shower with mod cueing for sequencing turning and RW management. Pt sat on TTB in walk in shower and completed UB bathing with (S). CGA balance support and cueing for sequencing when standing to wash LB. Pt had 1 L LOB when assuming figure 4 position but was able to self correct. Pt transferred out of shower with min A and to w/c. He completed oral hygiene at sink with (S). Pt donned shirt with (S). Min A to don pants, increased time required to thread BLE. Min A balance support required in standing when completing clothing management. Mod cueing for hemi technique. Mod cueing for stand > sit transfer because d/t pt's height, the chair rocks significantly when he backs up all the way to sit. Pt able to don B socks with figure 4 method sitting in w/c with set up assist. Pt used RW to complete ambulatory  transfer to the recliner with min A. Chair alarm belt was fastened and all needs met, pt left sitting up.   Therapy Documentation Precautions:  Precautions Precautions: Fall Precaution Comments: R inattention/ apraxia Restrictions Weight Bearing Restrictions: No   Therapy/Group: Individual Therapy  [name] 02/11/2019, 7:10 AM

## 2019-02-11 NOTE — Progress Notes (Signed)
Physical Therapy Session Note  Patient Details  Name: Christopher Morris MRN: [id number] Date of Birth: Nov 19, 1940  Today's Date: 02/11/2019 PT Individual Time: 1105-1201 PT Individual Time Calculation (min): 56 min   Short Term Goals: Week 1:  PT Short Term Goal 1 (Week 1): STG=LTG due to ELOS  Skilled Therapeutic Interventions/Progress Updates:  Pt received in recliner & agreeable to tx. Pt transfers sit<>stand with mod assist, wide BOS, cuing for anterior weight shifting & extra time. Pt ambulates around bed with RUE HHA, mod assist, with therapist providing manual facilitation for weight shifting L<>R, max cuing for increased step length BLE as pt attempts to shuffle, MAX cuing for sequencing to turn to sit in w/c and assistance with eccentric lowering. Transported pt to/from gym via w/c dependent assist for time management. Pt transfers w/c<>mat table via stand pivot with mod assist. Pt stood on wedge while engaging in reaching overhead to obtain clothespins with task focusing on heel cord stretch, anterior weight shifting (pt unable to shift pelvis anteriorly to correct posterior lean), RUE attention as pt requires max mulitmodal cuing to obtain objects with RUE, and standing balance with pt requiring up to max assist for standing balance. Pt sat with BLE on wedge for prolonged passive heel cord stretch. Pt then transferred onto floor with max multimodal cuing & max assist for focus on motor planning, sequencing, and R strengthening & NMR.  Assisted pt into tall kneeling with max multimodal cuing for anterior pelvic shift & hip extensor activation as well as upright posture but poor return demo & poor hip activation. Pt transitioned to quadruped and attempted to have pt transfer to supine on floor but pt unable to coordinate. Pt required >8 minutes and eventually +2 max assist to transfer off of floor to w/c as pt demonstrates significantly impaired motor planning, R inattention, R apraxia, decreased  weight shifting to advance BLE, even with multimodal cuing & visual cues. At end of session attempted to assist pt w/c>recliner but pt unable to transfer safely without max assist +1 so pt left in w/c with chair alarm donned, all needs at hand, & set up with meal tray.   Therapy Documentation Precautions:  Precautions Precautions: Fall Precaution Comments: R inattention/ apraxia Restrictions Weight Bearing Restrictions: No   Pain: No c/o pain reported.    Therapy/Group: Individual Therapy  [name] 02/11/2019, 12:14 PM

## 2019-02-11 NOTE — Progress Notes (Signed)
Occupational Therapy Session Note  Patient Details  Name: Christopher Morris MRN: [id number] Date of Birth: November 04, 1940  Today's Date: 02/11/2019 OT Individual Time: 1330-1430 OT Individual Time Calculation (min): 60 min    Short Term Goals: Week 1:  OT Short Term Goal 1 (Week 1): Pt will complete shower transfers with Min A using LRAD OT Short Term Goal 2 (Week 1): Pt will complete 2 grooming tasks while standing at the sink with supervision for balance OT Short Term Goal 3 (Week 1): Pt will complete toilet transfers with Min A using LRAD  Skilled Therapeutic Interventions/Progress Updates:    Patient seated in w/c, alert, significant lean to the right this afternoon.  Completed standing at table top with min a - knees flexed and right lean- completed variety of weight shifting activities in seated, squatting and standing positions, patient with increased midline with visual and tactile cuing.  Short distance ambulation with RW max A with difficulty clearing right foot and walker management with turns.  Unsupported sitting edge of mat with CS, lateral leans and trunk mobility activities with min A.  Patient returned to the room seated in w/c, seat belt alarm set and call bell in reach.    Therapy Documentation Precautions:  Precautions Precautions: Fall Precaution Comments: R inattention/ apraxia Restrictions Weight Bearing Restrictions: No General:   Vital Signs: Therapy Vitals Temp: 98 F (36.7 C) Temp Source: Oral Pulse Rate: 97 Resp: 18 BP: 140/75 Patient Position (if appropriate): Sitting Oxygen Therapy SpO2: 100 % O2 Device: Room Air Pain: Pain Assessment Pain Scale: 0-10 Pain Score: 0-No pain   Other Treatments:     Therapy/Group: Individual Therapy  Carlos Levering 02/11/2019, 3:54 PM

## 2019-02-11 NOTE — Progress Notes (Signed)
[facility] PHYSICAL MEDICINE & REHABILITATION PROGRESS NOTE   Subjective/Complaints: Pt in bed. Comfortable. Reports good night  ROS: Limited due to cognitive/behavioral     Objective:   No results found. No results for input(s): WBC, HGB, HCT, PLT in the last 72 hours. No results for input(s): NA, K, CL, CO2, GLUCOSE, BUN, CREATININE, CALCIUM in the last 72 hours.  Intake/Output Summary (Last 24 hours) at 02/11/2019 0857 Last data filed at 02/10/2019 1700 Gross per 24 hour  Intake 240 ml  Output -  Net 240 ml     Physical Exam: Vital Signs Blood pressure 119/74, pulse 68, temperature 98 F (36.7 C), resp. rate 18, height 6\' 4"  (1.93 m), weight 95.6 kg, SpO2 100 %. Constitutional: No distress . Vital signs reviewed. HEENT: EOMI, oral membranes moist Neck: supple Cardiovascular: RRR without murmur. No JVD    Respiratory: CTA Bilaterally without wheezes or rales. Normal effort    GI: BS +, non-tender, non-distended  Neurological: He isalert. Oriented to person and place.  Remains slow to process. RUE 4/5. LUE 4+/5. RLE 3-4/5 prox to distal. LLE 3+ to 4+/5. No focal sensory loss. DTR's trace to 1+  Psych: pleasant and cooperative   Assessment/Plan: 1. Functional deficits secondary to metabolic encephalopathy which require 3+ hours per day of interdisciplinary therapy in a comprehensive inpatient rehab setting.  Physiatrist is providing close team supervision and 24 hour management of active medical problems listed below.  Physiatrist and rehab team continue to assess barriers to discharge/monitor patient progress toward functional and medical goals  Care Tool:  Bathing    Body parts bathed by patient: Right arm, Left arm, Chest, Abdomen, Front perineal area, Buttocks, Right upper leg, Left upper leg, Right lower leg, Left lower leg, Face   Body parts bathed by helper: Buttocks     Bathing assist Assist Level: Minimal Assistance - Patient > 75%     Upper Body  Dressing/Undressing Upper body dressing   What is the patient wearing?: Pull over shirt    Upper body assist Assist Level: Supervision/Verbal cueing    Lower Body Dressing/Undressing Lower body dressing      What is the patient wearing?: Pants, Underwear/pull up     Lower body assist Assist for lower body dressing: Moderate Assistance - Patient 50 - 74%     Toileting Toileting    Toileting assist Assist for toileting: Minimal Assistance - Patient > 75%     Transfers Chair/bed transfer  Transfers assist     Chair/bed transfer assist level: Moderate Assistance - Patient 50 - 74%     Locomotion Ambulation   Ambulation assist      Assist level: Moderate Assistance - Patient 50 - 74% Assistive device: Hand held assist Max distance: 50 ft   Walk 10 feet activity   Assist     Assist level: Moderate Assistance - Patient - 50 - 74% Assistive device: Hand held assist   Walk 50 feet activity   Assist Walk 50 feet with 2 turns activity did not occur: Safety/medical concerns  Assist level: Moderate Assistance - Patient - 50 - 74% Assistive device: Hand held assist    Walk 150 feet activity   Assist Walk 150 feet activity did not occur: Safety/medical concerns         Walk 10 feet on uneven surface  activity   Assist Walk 10 feet on uneven surfaces activity did not occur: Safety/medical concerns         Wheelchair  Assist   Type of Wheelchair: Manual    Wheelchair assist level: Supervision/Verbal cueing Max wheelchair distance: 100    Wheelchair 50 feet with 2 turns activity    Assist        Assist Level: Supervision/Verbal cueing   Wheelchair 150 feet activity     Assist Wheelchair 150 feet activity did not occur: Safety/medical concerns         Medical Problem List and Plan: 1.Right side weakness with aphasiasecondary to acute metabolic encephalopathy with history of glioblastoma with  resection. --Continue CIR therapies including PT, OT, and SLP   -progressing toward goals 2. Antithrombotics: -DVT/anticoagulation:Eliquis -antiplatelet therapy: N/A 3. Pain Management:Tylenol as needed 4. Mood:Provide emotional support -antipsychotic agents: N/A 5. Neuropsych: This patientis notentirely capable of making decisions on hisown behalf.  6. Skin/Wound Care:continue local care as needed  7. Fluids/Electrolytes/Nutrition:encourage PO      8. Seizure disorder. Keppra increased to 1500 mg twice daily and the addition of Vimpat 50 mg twice daily. EEG negative for seizure. Patient to follow-up Dr. Mickeal Skinner as outpatient  -no seizure activity reported since admit to rehab    LOS: 5 days A FACE TO [place] 02/11/2019, 8:57 AM

## 2019-02-12 ENCOUNTER — Inpatient Hospital Stay (HOSPITAL_COMMUNITY): Payer: Medicare Other | Admitting: Occupational Therapy

## 2019-02-12 ENCOUNTER — Inpatient Hospital Stay (HOSPITAL_COMMUNITY): Payer: Medicare Other

## 2019-02-12 NOTE — Progress Notes (Signed)
Physical Therapy Session Note  Patient Details  Name: Christopher Morris MRN: [id number] Date of Birth: 12-30-1940  Today's Date: 02/12/2019 PT Individual Time: 0800-0900 PT Individual Time Calculation (min): 60 min   Short Term Goals: Week 1:  PT Short Term Goal 1 (Week 1): STG=LTG due to ELOS  Skilled Therapeutic Interventions/Progress Updates:     Patient in bed upon PT arrival. Patient alert and agreeable to PT session. Patient was very pleasant this morning and denied pain throughout session.   Therapeutic Activity: Bed Mobility: Patient performed supine to sit with supervision with bed flat and minimal use of bed rail. Provided verbal cues for trying not to use the bed rail to simulate home set up. Patient sat EOB and donned a new brief and pants with min A for threading LEs through and supervision for donning a sweatshirt. Transfers: Patient performed sit to/from stand x4 with min A and increased time using a RW. Provided facilitation for forward weight shift and verbal cues for pushing up from bed or w/c, leaning far forward to stand, and reaching back to sit (intermittently providing hand-over-hand technique for reaching back). Patient completed LB dressing in standing with RW with mod A to pull up pants and brief.   Gait Training:  Patient ambulated 10 feet and 125 feet using RW with min A. Ambulated with decreased gait speed, decreased B step length R>L, intermittent shuffling gait, decreased weight shift to the L, and mild veering to the R. Provided facilitation for L weight shift and verbal cues and demonstration for increased step length, encouraging patient to exaggerate steps as much as possible with some improvement.  Wheelchair Mobility:  Patient propelled wheelchair 50 feet using B UE and LE with min A for initiation and CGA. Provided demonstration for coordination of using UEs and LEs and verbal cues for larger movements for improved propulsion and avoiding obstacles x2 on the  R. He propelled the last 15 feet with only LE's for increased step length and NMR with increased benefit of a reciprocal pattern for LEs.    Neuromuscular Re-ed: Patient played Wii Baseball in standing with the RW 2x5 min and sitting in the w/c 1x5 min. PT provided demonstration for both hitting and pitching during the game. The patient was unable to initiate activity initially. PT provided hand-over-hand technique for swinging and throwing motions using his L UE, as he stated he is L handed, while he held the RW with his R.  Provided tactile and verbal cues for large motions for the sensor to read him. Required manual facilitation throughout with increased initiation of movement and timing throughout performing the task. Patient was facing the L wall and had to attend to the game on his R throughout the activity.  Patient in w/c at end of session with breaks locked, seat belt alarm set, and all needs within reach.    Therapy Documentation Precautions:  Precautions Precautions: Fall Precaution Comments: R inattention/ apraxia Restrictions Weight Bearing Restrictions: No    Therapy/Group: Individual Therapy  Sahian Kerney L Remedy Corporan PT, DPT  02/12/2019, 12:07 PM

## 2019-02-12 NOTE — Progress Notes (Signed)
[facility] PHYSICAL MEDICINE & REHABILITATION PROGRESS NOTE   Subjective/Complaints: Pt up in bed eating breakfast. No new complaints.   ROS: Patient denies fever, rash, sore throat, blurred vision, nausea, vomiting, diarrhea, cough, shortness of breath or chest pain, joint or back pain, headache, or mood change.    Objective:   No results found. No results for input(s): WBC, HGB, HCT, PLT in the last 72 hours. No results for input(s): NA, K, CL, CO2, GLUCOSE, BUN, CREATININE, CALCIUM in the last 72 hours.  Intake/Output Summary (Last 24 hours) at 02/12/2019 0917 Last data filed at 02/12/2019 0759 Gross per 24 hour  Intake 840 ml  Output -  Net 840 ml     Physical Exam: Vital Signs Blood pressure 123/65, pulse 70, temperature 97.7 F (36.5 C), temperature source Oral, resp. rate 18, height 6\' 4"  (1.93 m), weight 95.6 kg, SpO2 96 %. Constitutional: No distress . Vital signs reviewed. HEENT: EOMI, oral membranes moist Neck: supple Cardiovascular: RRR without murmur. No JVD    Respiratory: CTA Bilaterally without wheezes or rales. Normal effort    GI: BS +, non-tender, non-distended  Neurological: He isalert. Oriented to person and place. Follows simple commands. Slow to process. RUE 4/5. LUE 4+/5. RLE 3-4/5 prox to distal. LLE 3+ to 4+/5. No focal sensory loss. DTR's trace to 1+  Psych: very pleasant   Assessment/Plan: 1. Functional deficits secondary to metabolic encephalopathy which require 3+ hours per day of interdisciplinary therapy in a comprehensive inpatient rehab setting.  Physiatrist is providing close team supervision and 24 hour management of active medical problems listed below.  Physiatrist and rehab team continue to assess barriers to discharge/monitor patient progress toward functional and medical goals  Care Tool:  Bathing    Body parts bathed by patient: Right arm, Left arm, Chest, Abdomen, Front perineal area, Buttocks, Right upper leg, Left upper  leg, Right lower leg, Left lower leg, Face   Body parts bathed by helper: Buttocks     Bathing assist Assist Level: Minimal Assistance - Patient > 75%     Upper Body Dressing/Undressing Upper body dressing   What is the patient wearing?: Pull over shirt    Upper body assist Assist Level: Supervision/Verbal cueing    Lower Body Dressing/Undressing Lower body dressing      What is the patient wearing?: Pants, Underwear/pull up     Lower body assist Assist for lower body dressing: Minimal Assistance - Patient > 75%     Toileting Toileting    Toileting assist Assist for toileting: Minimal Assistance - Patient > 75%     Transfers Chair/bed transfer  Transfers assist     Chair/bed transfer assist level: Minimal Assistance - Patient > 75%     Locomotion Ambulation   Ambulation assist      Assist level: Moderate Assistance - Patient 50 - 74% Assistive device: Hand held assist Max distance: 10 ft   Walk 10 feet activity   Assist     Assist level: Moderate Assistance - Patient - 50 - 74% Assistive device: Hand held assist   Walk 50 feet activity   Assist Walk 50 feet with 2 turns activity did not occur: Safety/medical concerns  Assist level: Moderate Assistance - Patient - 50 - 74% Assistive device: Hand held assist    Walk 150 feet activity   Assist Walk 150 feet activity did not occur: Safety/medical concerns         Walk 10 feet on uneven surface  activity  Assist Walk 10 feet on uneven surfaces activity did not occur: Safety/medical concerns         Wheelchair     Assist   Type of Wheelchair: Manual    Wheelchair assist level: Supervision/Verbal cueing Max wheelchair distance: 100    Wheelchair 50 feet with 2 turns activity    Assist        Assist Level: Supervision/Verbal cueing   Wheelchair 150 feet activity     Assist Wheelchair 150 feet activity did not occur: Safety/medical concerns          Medical Problem List and Plan: 1.Right side weakness with aphasiasecondary to acute metabolic encephalopathy with history of glioblastoma with resection. --Continue CIR therapies including PT, OT, and SLP   2. Antithrombotics: -DVT/anticoagulation:Eliquis -antiplatelet therapy: N/A 3. Pain Management:Tylenol as needed 4. Mood:Provide emotional support -antipsychotic agents: N/A 5. Neuropsych: This patientis notentirely capable of making decisions on hisown behalf.  6. Skin/Wound Care:continue local care as needed  7. Fluids/Electrolytes/Nutrition:encourage PO      8. Seizure disorder. Keppra increased to 1500 mg twice daily and the addition of Vimpat 50 mg twice daily. EEG negative for seizure. Patient to follow-up Dr. Mickeal Skinner as outpatient  -seizure free  -sz precautions in place    LOS: 6 days A FACE TO FACE EVALUATION WAS PERFORMED  Meredith Staggers 02/12/2019, 9:17 AM

## 2019-02-13 ENCOUNTER — Inpatient Hospital Stay (HOSPITAL_COMMUNITY): Payer: Medicare Other | Admitting: Occupational Therapy

## 2019-02-13 ENCOUNTER — Inpatient Hospital Stay (HOSPITAL_COMMUNITY): Payer: Medicare Other | Admitting: Physical Therapy

## 2019-02-13 ENCOUNTER — Inpatient Hospital Stay (HOSPITAL_COMMUNITY): Payer: Medicare Other

## 2019-02-13 NOTE — Progress Notes (Signed)
Occupational Therapy Session Note  Patient Details  Name: Christopher Morris MRN: [id number] Date of Birth: Apr 22, 1941  Today's Date: 02/13/2019 OT Individual Time: 1100-1200 OT Individual Time Calculation (min): 60 min    Short Term Goals: Week 1:  OT Short Term Goal 1 (Week 1): Pt will complete shower transfers with Min A using LRAD OT Short Term Goal 2 (Week 1): Pt will complete 2 grooming tasks while standing at the sink with supervision for balance OT Short Term Goal 3 (Week 1): Pt will complete toilet transfers with Min A using LRAD  Skilled Therapeutic Interventions/Progress Updates:    Pt received in w/c with no c/o pain. Pt agreeable to shower. Pt completed functional mobility into bathroom with RW, cueing required for RW management, with min A. Pt transferred onto toilet with excellent eccentric control. Pt voided and completed peri hygiene with CGA in standing. Pt transferred into shower with min A, poor motor planning to sequence turn and required min A to correct. Pt washed UB with mod cueing for initiation. Pt required heavy cueing for washing buttocks, attempting to then wash face with soiled washcloth. Pt able to reach all areas with no physical assist, just requiring constant cueing. Pt transferred out of shower with CGA. He sat in w/c and donned shirt with min A. Pt donned underwear and pants with min A, requiring increased time to motor plan threading BLE but no physical assist. Pt reminded of hemi technique. Pt stood with min A and had LOB posteriorly, requiring max A to correct. Pt completed ambulatory transfer to recliner with min A using RW and he was set up for lunch. Chair alarm belt fastened.   Therapy Documentation Precautions:  Precautions Precautions: Fall Precaution Comments: R inattention/ apraxia Restrictions Weight Bearing Restrictions: No   Therapy/Group: Individual Therapy  [name] 02/13/2019, 7:15 AM

## 2019-02-13 NOTE — Progress Notes (Signed)
[place] PHYSICAL MEDICINE & REHABILITATION PROGRESS NOTE   Subjective/Complaints: No new complaints. Comfortable in bed  ROS: Patient denies fever, rash, sore throat, blurred vision, nausea, vomiting, diarrhea, cough, shortness of breath or chest pain, joint or back pain, headache, or mood change.    Objective:   No results found. No results for input(s): WBC, HGB, HCT, PLT in the last 72 hours. No results for input(s): NA, K, CL, CO2, GLUCOSE, BUN, CREATININE, CALCIUM in the last 72 hours.  Intake/Output Summary (Last 24 hours) at 02/13/2019 0858 Last data filed at 02/12/2019 1901 Gross per 24 hour  Intake 360 ml  Output -  Net 360 ml     Physical Exam: Vital Signs Blood pressure 123/70, pulse 72, temperature 97.8 F (36.6 C), resp. rate 17, height 6\' 4"  (1.93 m), weight 95.6 kg, SpO2 99 %. Constitutional: No distress . Vital signs reviewed. HEENT: EOMI, oral membranes moist Neck: supple Cardiovascular: RRR without murmur. No JVD    Respiratory: CTA Bilaterally without wheezes or rales. Normal effort    GI: BS +, non-tender, non-distended  Neurological: He isalert. Delayed processing. Follows simple commands.  RUE 4/5. LUE 4+/5. RLE 3-4/5 prox to distal. LLE 3+ to 4+/5. No focal sensory loss. DTR's trace to 1+  Psych: pleasant as always  Assessment/Plan: 1. Functional deficits secondary to metabolic encephalopathy which require 3+ hours per day of interdisciplinary therapy in a comprehensive inpatient rehab setting.  Physiatrist is providing close team supervision and 24 hour management of active medical problems listed below.  Physiatrist and rehab team continue to assess barriers to discharge/monitor patient progress toward functional and medical goals  Care Tool:  Bathing    Body parts bathed by patient: Right arm, Left arm, Chest, Abdomen, Front perineal area, Buttocks, Right upper leg, Left upper leg, Right lower leg, Left lower leg, Face   Body parts bathed  by helper: Buttocks     Bathing assist Assist Level: Minimal Assistance - Patient > 75%     Upper Body Dressing/Undressing Upper body dressing   What is the patient wearing?: Pull over shirt    Upper body assist Assist Level: Supervision/Verbal cueing    Lower Body Dressing/Undressing Lower body dressing      What is the patient wearing?: Pants, Underwear/pull up     Lower body assist Assist for lower body dressing: Minimal Assistance - Patient > 75%     Toileting Toileting    Toileting assist Assist for toileting: Minimal Assistance - Patient > 75%     Transfers Chair/bed transfer  Transfers assist     Chair/bed transfer assist level: Minimal Assistance - Patient > 75% Chair/bed transfer assistive device: Programmer, multimedia   Ambulation assist      Assist level: Minimal Assistance - Patient > 75% Assistive device: Walker-rolling Max distance: 125'   Walk 10 feet activity   Assist     Assist level: Minimal Assistance - Patient > 75% Assistive device: Walker-rolling   Walk 50 feet activity   Assist Walk 50 feet with 2 turns activity did not occur: Safety/medical concerns  Assist level: Minimal Assistance - Patient > 75% Assistive device: Walker-rolling    Walk 150 feet activity   Assist Walk 150 feet activity did not occur: Safety/medical concerns  Assist level: Minimal Assistance - Patient > 75% Assistive device: Walker-rolling    Walk 10 feet on uneven surface  activity   Assist Walk 10 feet on uneven surfaces activity did not occur: Safety/medical concerns  Wheelchair     Assist   Type of Wheelchair: Manual    Wheelchair assist level: Minimal Assistance - Patient > 75% Max wheelchair distance: 50'    Wheelchair 50 feet with 2 turns activity    Assist        Assist Level: Minimal Assistance - Patient > 75%   Wheelchair 150 feet activity     Assist Wheelchair 150 feet activity did  not occur: Safety/medical concerns         Medical Problem List and Plan: 1.Right side weakness with aphasiasecondary to acute metabolic encephalopathy with history of glioblastoma with resection. -Continue CIR therapies including PT, OT, and SLP    2. Antithrombotics: -DVT/anticoagulation:Eliquis -antiplatelet therapy: N/A 3. Pain Management:Tylenol as needed 4. Mood:Provide emotional support -antipsychotic agents: N/A 5. Neuropsych: This patientis notentirely capable of making decisions on hisown behalf.  6. Skin/Wound Care:continue local care as needed  7. Fluids/Electrolytes/Nutrition:encourage PO      8. Seizure disorder. Keppra increased to 1500 mg twice daily and the addition of Vimpat 50 mg twice daily. EEG negative for seizure. Patient to follow-up Dr. Mickeal Skinner as outpatient  -seizure free since acute  -continue sz precautions    LOS: 7 days A FACE TO FACE EVALUATION WAS PERFORMED  Meredith Staggers 02/13/2019, 8:58 AM

## 2019-02-13 NOTE — Progress Notes (Signed)
Physical Therapy Session Note  Patient Details  Name: Christopher Morris MRN: [id number] Date of Birth: [birth year]  Today's Date: 02/13/2019 PT Individual Time: 0906-1000 PT Individual Time Calculation (min): 54 min   Short Term Goals: Week 2:  PT Short Term Goal 1 (Week 2): STG = LTG due to estimated d/c date.  Skilled Therapeutic Interventions/Progress Updates:  Pt received in bed & agreeable to tx. Pt transfers supine>sitting EOB with bed flat, no rails, with supervision. Pt incontinent of urine & assisted pt with donning clean brief total assist for time management. Also assisted pt with donning pants, ted hose, & shoes for time management with pt demonstrating decreased attention to RLE. Transfers sit>stand with min assist with cuing for BUE and pt with improving ability to transfer with pushing to standing with BUE on bed. Pt transfers bed>w/c with RW & min assist with short step length BLE. Pt changes into clean paper shirt with set up assist. Pt washes face at sink from w/c level with supervision then reports need to use restroom. Pt ambulates in/out of bathroom with RW & min assist with multimodal cuing for increased step length RLE especially to clear bathroom threshold. Provided assistance for clothing management and pt demonstrates good eccentric control onto toilet with min assist and max cuing for slow speed. After extended time on toilet pt unable to have BM so transferred off of low toilet with grab bars & supervision and therapist provides assistance for managing clothing. Pt stands at sink with CGA for balance to perform hand hygiene with assistance for turning water on/off.  Transported pt to/from gym via w/c dependent assist for time management. Provided demonstration & instructional cuing for negotiating curb step to simulate home entrance with RW. Pt negotiated step 2 times with min assist but max cuing for sequencing and safety with pt continuing to demonstrate decreased attention to  RLE. Pt would benefit from additional practice prior to d/c. When ambulating and turning with RW pt with impaired awareness and requires max cuing but poor demo to ambulate with BLE inside base of RW. At end of session pt left in w/c with chair alarm donned & call bell in reach.   Therapy Documentation Precautions:  Precautions Precautions: Fall Precaution Comments: R inattention/ apraxia Restrictions Weight Bearing Restrictions: No  Pain: No c/o pain reported.    Therapy/Group: Individual Therapy  [name] 02/13/2019, 10:05 AM

## 2019-02-13 NOTE — Progress Notes (Signed)
Occupational Therapy Session Note  Patient Details  Name: Christopher Morris MRN: [id number] Date of Birth: 06-07-1941  Today's Date: 02/13/2019 OT Individual Time: 7793-9030 OT Individual Time Calculation (min): 75 min    Short Term Goals: Week 1:  OT Short Term Goal 1 (Week 1): Pt will complete shower transfers with Min A using LRAD OT Short Term Goal 2 (Week 1): Pt will complete 2 grooming tasks while standing at the sink with supervision for balance OT Short Term Goal 3 (Week 1): Pt will complete toilet transfers with Min A using LRAD  Skilled Therapeutic Interventions/Progress Updates:    Patient seated in recliner, ready for therapy session.  He is pleasant and cooperative, follows directions but often with delayed response/increased time to complete.  occ word finding deficit.  Sit to stand from recliner, w/c, mat table with CGA - occ min a, increased time and tactile cues for trunk position.  Short distance ambulation with RW CGA with occ tactile cues for directionality (end of session walk required min a for weight shift - fatigue a factor)  Patient completed seated and standing tasks t/o session with focus on trunk/ UB mobility, weight shift, rhythmic movement - using drumming, throwing/tossing, swinging a simulated golf club/baseball bat, sliding movement, tossing underhand with bilateral UEs simultaneously.  Completed standing balance and reaching tasks with CGA for OH reach and behind/lower reach to mat table in each direction.  Patient with good endurance for all tasks presented.  He returned to recliner at close of session with seat belt alarm set and call bell in reach.    Therapy Documentation Precautions:  Precautions Precautions: Fall Precaution Comments: R inattention/ apraxia Restrictions Weight Bearing Restrictions: No General:   Vital Signs:   Pain: Pain Assessment Pain Scale: 0-10 Pain Score: 0-No pain Other Treatments:     Therapy/Group: Individual  Therapy  Carlos Levering 02/13/2019, 3:54 PM

## 2019-02-13 NOTE — Progress Notes (Signed)
Physical Therapy Weekly Progress Note  Patient Details  Name: Christopher Morris MRN: [id number] Date of Birth: 05/26/1941  Beginning of progress report period: February 07, 2019 End of progress report period: February 13, 2019  Today's Date: 02/13/2019   Pt is making fair progress towards LTG's. Focus of PT session has been on normalizing gait pattern as pt demonstrates decreased weight shifting, wide BOS, intermittent shuffling gait & reduced heel strike. Also focused on R attention & awareness as pt demonstrates decreased attention to R side of body and apraxia. Pt currently requires as little as min assist with RW (has required up to mod assist for gait without AD & for stand pivot transfers) and min<>mod assist for sit<>stand transfers. Pt would benefit from continued skilled PT treatment to focus on increasing independence with gait, practicing stair negotiation with LRAD, balance, R attention, awareness, endurance, R NMR, and for caregiver education prior to d/c.   Patient continues to demonstrate the following deficits muscle weakness and muscle joint tightness, decreased cardiorespiratoy endurance, motor apraxia, decreased coordination and decreased motor planning, decreased visual perceptual skills, decreased attention to right and decreased motor planning, decreased initiation, decreased attention, decreased awareness, decreased problem solving, decreased safety awareness, decreased memory and delayed processing, and decreased standing balance, decreased postural control, decreased balance strategies and R hemiparesis and therefore will continue to benefit from skilled PT intervention to increase functional independence with mobility.  Patient progressing toward long term goals..  Continue plan of care.  PT Short Term Goals Week 1:  PT Short Term Goal 1 (Week 1): STG=LTG due to ELOS Week 2:  PT Short Term Goal 1 (Week 2): STG = LTG due to estimated d/c date.    Therapy  Documentation Precautions:  Precautions Precautions: Fall Precaution Comments: R inattention/ apraxia Restrictions Weight Bearing Restrictions: No   Therapy/Group: Individual Therapy  [name] 02/13/2019, 7:53 AM

## 2019-02-14 ENCOUNTER — Inpatient Hospital Stay (HOSPITAL_COMMUNITY): Payer: Medicare Other | Admitting: Physical Therapy

## 2019-02-14 ENCOUNTER — Inpatient Hospital Stay (HOSPITAL_COMMUNITY): Payer: Medicare Other | Admitting: Occupational Therapy

## 2019-02-14 DIAGNOSIS — R569 Unspecified convulsions: Secondary | ICD-10-CM

## 2019-02-14 DIAGNOSIS — E8809 Other disorders of plasma-protein metabolism, not elsewhere classified: Secondary | ICD-10-CM

## 2019-02-14 DIAGNOSIS — E46 Unspecified protein-calorie malnutrition: Secondary | ICD-10-CM

## 2019-02-14 MED ORDER — PRO-STAT SUGAR FREE PO LIQD
30.0000 mL | Freq: Two times a day (BID) | ORAL | Status: DC
  Administered 2019-02-14 – 2019-02-16 (×5): 30 mL via ORAL
  Filled 2019-02-14 (×4): qty 30

## 2019-02-14 NOTE — Progress Notes (Signed)
[place] PHYSICAL MEDICINE & REHABILITATION PROGRESS NOTE   Subjective/Complaints: Patient seen laying in bed this morning.  He states he slept well overnight.  He denies complaints.  ROS: Denies CP, SOB, N/V/D  Objective:   No results found. No results for input(s): WBC, HGB, HCT, PLT in the last 72 hours. No results for input(s): NA, K, CL, CO2, GLUCOSE, BUN, CREATININE, CALCIUM in the last 72 hours.  Intake/Output Summary (Last 24 hours) at 02/14/2019 1251 Last data filed at 02/14/2019 0900 Gross per 24 hour  Intake 600 ml  Output -  Net 600 ml     Physical Exam: Vital Signs Blood pressure 125/64, pulse 67, temperature 98.1 F (36.7 C), resp. rate 18, height 6\' 4"  (1.93 m), weight 95.6 kg, SpO2 96 %. Constitutional: No distress . Vital signs reviewed. HENT: Normocephalic.  Atraumatic. Eyes: EOMI. No discharge. Cardiovascular: No JVD. Respiratory: Normal effort. GI: Non-distended. Musc: No edema or tenderness in extremities. Neurological: He isalert. Delayed processing.  Motor: Grossly 4/5 throughout Psych: Normal mood.  Normal affect.  Assessment/Plan: 1. Functional deficits secondary to metabolic encephalopathy which require 3+ hours per day of interdisciplinary therapy in a comprehensive inpatient rehab setting.  Physiatrist is providing close team supervision and 24 hour management of active medical problems listed below.  Physiatrist and rehab team continue to assess barriers to discharge/monitor patient progress toward functional and medical goals  Care Tool:  Bathing    Body parts bathed by patient: Right arm, Left arm, Chest, Abdomen, Front perineal area, Buttocks, Right upper leg, Left upper leg, Right lower leg, Left lower leg, Face   Body parts bathed by helper: Buttocks     Bathing assist Assist Level: Contact Guard/Touching assist     Upper Body Dressing/Undressing Upper body dressing   What is the patient wearing?: Pull over shirt    Upper  body assist Assist Level: Minimal Assistance - Patient > 75%    Lower Body Dressing/Undressing Lower body dressing      What is the patient wearing?: Pants, Underwear/pull up     Lower body assist Assist for lower body dressing: Minimal Assistance - Patient > 75%     Toileting Toileting    Toileting assist Assist for toileting: Minimal Assistance - Patient > 75%     Transfers Chair/bed transfer  Transfers assist     Chair/bed transfer assist level: Minimal Assistance - Patient > 75% Chair/bed transfer assistive device: Museum/gallery exhibitions officer assist      Assist level: Minimal Assistance - Patient > 75% Assistive device: Walker-rolling Max distance: 10 ft   Walk 10 feet activity   Assist     Assist level: Minimal Assistance - Patient > 75% Assistive device: Walker-rolling   Walk 50 feet activity   Assist Walk 50 feet with 2 turns activity did not occur: Safety/medical concerns  Assist level: Minimal Assistance - Patient > 75% Assistive device: Walker-rolling    Walk 150 feet activity   Assist Walk 150 feet activity did not occur: Safety/medical concerns  Assist level: Minimal Assistance - Patient > 75% Assistive device: Walker-rolling    Walk 10 feet on uneven surface  activity   Assist Walk 10 feet on uneven surfaces activity did not occur: Safety/medical concerns         Wheelchair     Assist   Type of Wheelchair: Manual    Wheelchair assist level: Minimal Assistance - Patient > 75% Max wheelchair distance: 46'    Wheelchair  50 feet with 2 turns activity    Assist        Assist Level: Minimal Assistance - Patient > 75%   Wheelchair 150 feet activity     Assist Wheelchair 150 feet activity did not occur: Safety/medical concerns         Medical Problem List and Plan: 1.Right side weakness with aphasiasecondary to acute metabolic encephalopathy with history of glioblastoma with  resection.  Continue CIR 2. Antithrombotics: -DVT/anticoagulation:Eliquis -antiplatelet therapy: N/A 3. Pain Management:Tylenol as needed 4. Mood:Provide emotional support -antipsychotic agents: N/A 5. Neuropsych: This patientis not fully capable of making decisions on hisown behalf.  6. Skin/Wound Care:continue local care as needed   BMP within acceptable range on 7/24, labs ordered for Monday 7. Fluids/Electrolytes/Nutrition:encourage PO 8. Seizure disorder. Keppra increased to 1500 mg twice daily and the addition of Vimpat 50 mg twice daily. EEG negative for seizure. Patient to follow-up Dr. Mickeal Skinner as outpatient  -seizure free since admission to rehab  -continue sz precautions 9.  Hypoalbuminemia  Supplement initiated on 8/1   LOS: 8 days A FACE TO FACE EVALUATION WAS PERFORMED   Lorie Phenix 02/14/2019, 12:51 PM

## 2019-02-14 NOTE — Progress Notes (Signed)
Occupational Therapy Session Note  Patient Details  Name: Christopher Morris MRN: [id number] Date of Birth: 05-12-1941  Today's Date: 02/14/2019 OT Individual Time: 0950-1050 OT Individual Time Calculation (min): 60 min    Skilled Therapeutic Interventions/Progress Updates:  Focus this session:  Upper body bathing and dressing setup and extra time and cues to dress hemiside first and to maintain awareness of right upper extremity  Lowerbody bathingand dressing = extra time and min A as patient moves in slow, thoughtful, measured pace  Dynamic sitting balance to don socks and to shoes=supervision  Sit to stand= extra time and close S;   Except cues for technique and min A for very low surfaces  Toieting=close s while dynamically standing to complete self cleansing of buttocks and using bilateral hands to pull up pants  Left seated in recliner with call bell and chair alarm in place  Continue OT plan of care         Therapy Documentation Precautions:  Precautions Precautions: Fall Precaution Comments: R inattention/ apraxia Restrictions Weight Bearing Restrictions: No  Pain:denied   ADL:   Therapy/Group: Individual Therapy  Alfredia Ferguson [facility] 02/14/2019, 3:18 PM

## 2019-02-14 NOTE — Patient Care Conference (Signed)
Inpatient RehabilitationTeam Conference and Plan of Care Update Date: 02/10/2019   Time: 2:35 PM    Patient Name: Christopher Morris      Medical Record Number: [id number]  Date of Birth: 1941-01-29 Sex: Male         Room/Bed: 4W06C/4W06C-01 Payor Info: Payor: [employer] / Plan: [employer] MEDICARE / Product Type: *No Product type* /    Admitting Diagnosis: 2. TBI Team  NTBI, enceph, 13-15 days  Admit Date/Time:  02/06/2019  3:07 PM Admission Comments: No comment available   Primary Diagnosis:  <principal problem not specified> Principal Problem: <principal problem not specified>  Patient Active Problem List   Diagnosis Date Noted  . Hypoalbuminemia due to protein-calorie malnutrition ([place])   . Metabolic encephalopathy 99/37/1696  . Seizures ([place]) 02/03/2019  . Right sided weakness 02/02/2019  . Acute metabolic encephalopathy 78/93/8101  . Aphasia 12/29/2018  . Pneumonia 12/22/2017  . HCAP (healthcare-associated pneumonia) 12/22/2017  . Pulmonary embolism ([place]) 12/22/2017  . Tachycardia   . Vitamin B12 deficiency 11/14/2017  . Anemia 11/14/2017  . Fever 11/13/2017  . Dehydration 11/13/2017  . Interstitial pneumonia ([place]) 11/11/2017  . Sepsis ([place]) 11/09/2017  . Bacteremia due to Gram-negative bacteria 10/26/2017  . Sepsis due to Escherichia coli (E. coli) ([place]) 10/26/2017  . UTI (urinary tract infection) 10/25/2017  . Seizure ([place]) 09/25/2017  . Frontal glioblastoma multiforme ([place]) 09/25/2017  . Hyperglycemia 09/25/2017  . BLOOD IN STOOL 07/26/2008  . ERECTILE DYSFUNCTION 07/12/2008    Expected Discharge Date: Expected Discharge Date: 02/21/19  Team Members Present: Physician leading conference: Dr. Alger Simons Social Worker Present: Alfonse Alpers, LCSW Nurse Present: Dwaine Gale, RN PT Present: Lavone Nian, PT OT Present: Laverle Hobby, OT SLP Present: Weston Anna, SLP PPS Coordinator present : Gunnar Fusi, SLP     Current Status/Progress Goal  Weekly Team Focus  Medical   metabolic encephalopathy, hx of GBM, seizures, baseline cognitive deficits  improve functional mobility and activity tolerance  see above   Bowel/Bladder   Continent of B/B occasional urinary incontinence LBM 07/27  continent of B/B  toilet Q2H and prn   Swallow/Nutrition/ Hydration             ADL's   mod A transfers and ambulation with RW due to R inattention/motor perseverations, min-mod A with UB self care, mod A LB self care  supervision BADLs  ADL training, coordination, R side awareness training, balance, motor planning, pt education   Mobility   supervision bed mobility with hospital bed features, min/mod assist sit<>stand & gait with RW, decreased attention to R  supervision overall with LRAD  R NMR, transfers, gait, awareness, balance, bed mobility, gait, stair negotiation, strengthening, endurance   Communication             Safety/Cognition/ Behavioral Observations            Pain   pt denies any pain tylenol prn  free of pain  assess for pain q shift and prn   Skin   Skin intact  skin remain intact  assess skin qshift and prn    Rehab Goals Patient on target to meet rehab goals: Yes Rehab Goals Revised: none *See Care Plan and progress notes for long and short-term goals.     Barriers to Discharge  Current Status/Progress Possible Resolutions Date Resolved   Physician    Medical stability        supervision at home      Nursing  PT                    OT                  SLP                SW                Discharge Planning/Teaching Needs:  Pt to return to his home where his dtr and grandson will be with him all the time.  Dtr will come in for family education next week.   Team Discussion:  Pt with metabolic encephalopathy with multiple medical issues - glioblastoma, seizures, etc.  Pt is doing well medically and close to baseline cognitively.  Pt is continent and calls for help appropriately, just is  very slow, but can get things done.  Pt is mod A with txs with RW, min/mod UB tasks, mod LB tasks, with supervision goals.  Pt is mod A with handhold vs RW for gait, but needs max cueing.  Pt is S/min A for stairs.  Revisions to Treatment Plan:  none    Continued Need for Acute Rehabilitation Level of Care: The patient requires daily medical management by a physician with specialized training in physical medicine and rehabilitation for the following conditions: Daily direction of a multidisciplinary physical rehabilitation program to ensure safe treatment while eliciting the highest outcome that is of practical value to the patient.: Yes Daily medical management of patient stability for increased activity during participation in an intensive rehabilitation regime.: Yes Daily analysis of laboratory values and/or radiology reports with any subsequent need for medication adjustment of medical intervention for : Neurological problems   I attest that I was present, lead the team conference, and concur with the assessment and plan of the team.Team conference was held via web/ teleconference due to [place] - 19.   Phinley Schall, Silvestre Mesi 02/14/2019, 5:20 PM

## 2019-02-14 NOTE — Therapy (Signed)
Late entry for 7.30.20 written 8.1.20  1st Session: 10:45-1145 including [id number] chart review   Patient participated in various tasks to address goals and increase safety and independence.  Will summarize main focus as follows:   He completed stand pivot and/or squat pivot transfers with overall moderate assistance and extra time for processing and planning.  Dynamic standing balance to complete oral care at sink= moderate assistance to decrease right lateral leans  Upper body dressing= min tactile and verbal cues for hemi dressing techniques and to don right sleeve up to shoulder  Lower body dressing= extra time for processing and minimal to moderate assistance to incorporate right hand in bilateral task such as pulling up pants and in order to help decrease right lateral leaning duirng  dynamic standing tasks and transfers.   As well, completed right upper extremeity and lower extremeity weight bearing activities to increase function and awareness of right side of body   also Important to note this session:  Patient with much difficulty going sit to stand, especially with scooting forward on the surface to mobilize to edge of surface in prep for standing . required moderate tactile, verbal and physical assist to scoot to front of surface (w/c, mat in gym, toilet, recliner in room).   Lower surfaces were more challenging for him that higher surfaces.  Patient required reminders to attend to and incorporate right UE (dominant UE) into tasks  2nd session: 1300-1418 (78 minutes) focus was neuro re education and motor/cognitive planning, to help increase safety, increase balance to help decrease right lateral leans, increase thoracic extension  Continue Plan of care

## 2019-02-14 NOTE — Progress Notes (Signed)
Social Work Patient ID: Christopher Morris, male   DOB: 05/16/1941, 78 y.o.   MRN: [id number]   CSW met with pt 02-10-19 and later talked with his dtr via telephone to update them on team conference discussion and targeted d/c date of 02-21-19.  Pt's dtr will take a little time off for family education and for pt's return home.  Her son will be with pt when she has to go back to work.  Dtr did note that pt seems slower in his responses over the phone than he was before.  Think family education and maybe visiting with dtr once visitor restrictions allow will help family and team moving forward.  CSW will continue to follow and will work toward preparing pt for d/c.

## 2019-02-14 NOTE — Progress Notes (Signed)
Physical Therapy Session Note  Patient Details  Name: Christopher Morris MRN: [id number] Date of Birth: [birth year]  Today's Date: 02/14/2019 PT Individual Time: 5701-7793 PT Individual Time Calculation (min): 58 min   Short Term Goals: Week 1:  PT Short Term Goal 1 (Week 1): STG=LTG due to ELOS  Skilled Therapeutic Interventions/Progress Updates:    Pt received sitting in recliner and agreeable to therapy session. Sit>stand recliner>RW with mod assist for lifting requiring 2 trials to achieve upright due to R lateral lean and inability to achieve midline orientation despite cuing and manual facilitation as pt is not letting go of chair armrest with R hand to relocate it to RW. Stand pivot recliner>w/c using RW with min assist for balance and mod/max cuing for LE stepping and AD management.  Transported to/from gym in w/c. Stand pivot w/c>EOM using RW with min assist for balance. Performed repeated sit<>stand EOM<>RW with mod progressed to CGA for coming to standing as pt demonstrates very slow speed of movement and requires increased time to achieve upright - demonstrates poor eccentric control during lowering- mod cuing for placing R hand on RW. Pre-gait training using RW focusing on R LE NMR and increased step length via stepping forwards/backwards towards external target progressed to stepping forwards/backwards on/off 2" step towards external target all with CGA for steadying. Ambulated 16ft using RW with CGA for steadying and manual facilitation for increased R/L lateral weight shifting to improve step length. Standing balance task with L UE support on RW focusing on R UE NMR and R attention to grasp card and match it to board located on his R - pt requires increased time to locate cards on R inferior portion of board. Stand pivot EOM>w/c using RW with min assist for balance and max cuing for LE stepping and AD management. Transported back to room in w/c. Ambulated ~59ft using RW w/c>recliner with CGA  for steadying and min/mod manual facilitation for R/L lateral weight shifting. Pt left sitting in recliner with needs in reach, seat belt alarm on, and meal tray set-up.   Therapy Documentation Precautions:  Precautions Precautions: Fall Precaution Comments: R inattention/ apraxia Restrictions Weight Bearing Restrictions: No  Pain:   Denies pain during session.   Therapy/Group: Individual Therapy  Tawana Scale, PT, DPT 02/14/2019, 3:16 PM

## 2019-02-15 ENCOUNTER — Inpatient Hospital Stay (HOSPITAL_COMMUNITY): Payer: Medicare Other

## 2019-02-15 NOTE — Progress Notes (Signed)
Occupational Therapy Session Note  Patient Details  Name: Christopher Morris MRN: [id number] Date of Birth: [birth year]  Today's Date: 02/15/2019 OT Individual Time: 1000-1100 OT Individual Time Calculation (min): 60 min    Short Term Goals: Week 1:  OT Short Term Goal 1 (Week 1): Pt will complete shower transfers with Min A using LRAD OT Short Term Goal 2 (Week 1): Pt will complete 2 grooming tasks while standing at the sink with supervision for balance OT Short Term Goal 3 (Week 1): Pt will complete toilet transfers with Min A using LRAD  Skilled Therapeutic Interventions/Progress Updates:    Pt received sitting up in the recliner with no c/o pain. Pt required mod A to power up from the recliner. Once up, pt completed ambulatory transfer into the bathroom with CGA. Min A required when pt was ascending the small uphill threshold into the bathroom. Mod cueing throughout for RW management. Pt required no cueing for safe stand > sit toilet transfer, using L grab bar to lower himself with CGA. Pt transferred into the shower with mod cueing for RW positioning. Pt completed bathing, standing for majority of shower with at least 1 UE on anterior grab bar the entire time. Pt required min cueing for task progression. Pt dried off and transferred to w/c. Pt donned shirt with set up assist. Pt struggling to thread pants over LLE after doing RLE first, pt reminded of hemi technique and he was able to start over and thread BLE through with no physical assist. Pt able to stand with CGA from w/c! With moderate cueing for Rt attention/use of hand, pt able to pull up pants without any physical assist. Great improvement in dressing this session. Pt sat at sink and brushed teeth. Pt was left sitting up in the recliner with all needs met, chair alarm set.  Therapy Documentation Precautions:  Precautions Precautions: Fall Precaution Comments: R inattention/ apraxia Restrictions Weight Bearing Restrictions:  No   Therapy/Group: Individual Therapy  [name] 02/15/2019, 7:09 AM

## 2019-02-15 NOTE — Plan of Care (Signed)
  Problem: Consults Goal: RH STROKE PATIENT EDUCATION Description: See Patient Education module for education specifics  Outcome: Progressing   Problem: RH BLADDER ELIMINATION Goal: RH STG MANAGE BLADDER WITH ASSISTANCE Description: STG Manage Bladder With min Assistance Outcome: Progressing   Problem: RH SAFETY Goal: RH STG ADHERE TO SAFETY PRECAUTIONS W/ASSISTANCE/DEVICE Description: STG Adhere to Safety Precautions With supervision cues/reminders. Outcome: Progressing   Problem: RH KNOWLEDGE DEFICIT Goal: RH STG INCREASE KNOWLEDGE OF STROKE PROPHYLAXIS Description: Pt will be able to direct care at discharge with cues/reminders of dtr regarding secondary stroke prophylaxis and prevention of secondary stroke using handouts and educational materials. Outcome: Progressing

## 2019-02-15 NOTE — Progress Notes (Signed)
PHYSICAL MEDICINE & REHABILITATION PROGRESS NOTE   Subjective/Complaints: Patient seen laying in bed this morning.  He states he slept well overnight.  He denies complaints.  ROS: Denies CP, SOB, N/V/D  Objective:   No results found. No results for input(s): WBC, HGB, HCT, PLT in the last 72 hours. No results for input(s): NA, K, CL, CO2, GLUCOSE, BUN, CREATININE, CALCIUM in the last 72 hours.  Intake/Output Summary (Last 24 hours) at 02/15/2019 1106 Last data filed at 02/14/2019 1844 Gross per 24 hour  Intake 440 ml  Output -  Net 440 ml     Physical Exam: Vital Signs Blood pressure 111/63, pulse 65, temperature 98.5 F (36.9 C), temperature source Oral, resp. rate 16, height 6\' 4"  (1.93 m), weight 95.6 kg, SpO2 99 %. Constitutional: No distress . Vital signs reviewed. HENT: Normocephalic.  Atraumatic. Eyes: EOMI.  No discharge. Cardiovascular: No JVD. Respiratory: Normal effort. GI: Non-distended. Musc: No edema or tenderness in extremities. Neurological: Alert. Delayed processing.  Motor: Grossly 4/5 throughout unchanged Psych: Normal mood.  Normal affect.  Assessment/Plan: 1. Functional deficits secondary to metabolic encephalopathy which require 3+ hours per day of interdisciplinary therapy in a comprehensive inpatient rehab setting.  Physiatrist is providing close team supervision and 24 hour management of active medical problems listed below.  Physiatrist and rehab team continue to assess barriers to discharge/monitor patient progress toward functional and medical goals  Care Tool:  Bathing    Body parts bathed by patient: Right arm, Left arm, Chest, Abdomen, Front perineal area, Buttocks, Right upper leg, Left upper leg, Right lower leg, Left lower leg, Face   Body parts bathed by helper: Buttocks     Bathing assist Assist Level: Contact Guard/Touching assist     Upper Body Dressing/Undressing Upper body dressing   What is the patient  wearing?: Pull over shirt    Upper body assist Assist Level: Set up assist    Lower Body Dressing/Undressing Lower body dressing      What is the patient wearing?: Pants, Underwear/pull up     Lower body assist Assist for lower body dressing: Contact Guard/Touching assist     Toileting Toileting    Toileting assist Assist for toileting: Contact Guard/Touching assist     Transfers Chair/bed transfer  Transfers assist     Chair/bed transfer assist level: Contact Guard/Touching assist Chair/bed transfer assistive device: Walker, Clinical biochemist   Ambulation assist      Assist level: Contact Guard/Touching assist Assistive device: Walker-rolling Max distance: 62ft   Walk 10 feet activity   Assist     Assist level: Contact Guard/Touching assist Assistive device: Walker-rolling   Walk 50 feet activity   Assist Walk 50 feet with 2 turns activity did not occur: Safety/medical concerns  Assist level: Contact Guard/Touching assist Assistive device: Walker-rolling    Walk 150 feet activity   Assist Walk 150 feet activity did not occur: Safety/medical concerns  Assist level: Minimal Assistance - Patient > 75% Assistive device: Walker-rolling    Walk 10 feet on uneven surface  activity   Assist Walk 10 feet on uneven surfaces activity did not occur: Safety/medical concerns         Wheelchair     Assist   Type of Wheelchair: Manual    Wheelchair assist level: Minimal Assistance - Patient > 75% Max wheelchair distance: 60'    Wheelchair 50 feet with 2 turns activity    Assist  Assist Level: Minimal Assistance - Patient > 75%   Wheelchair 150 feet activity     Assist Wheelchair 150 feet activity did not occur: Safety/medical concerns         Medical Problem List and Plan: 1.Right side weakness with aphasiasecondary to acute metabolic encephalopathy with history of glioblastoma with  resection.  Continue CIR, improving 2. Antithrombotics: -DVT/anticoagulation:Continue Eliquis -antiplatelet therapy: N/A 3. Pain Management:Tylenol as needed  Controlled on 8/2 without medication 4. Mood:Provide emotional support -antipsychotic agents: N/A 5. Neuropsych: This patientis not fully capable of making decisions on hisown behalf.  6. Skin/Wound Care:continue local care as needed   BMP within acceptable range on 7/24, labs ordered for tomorrow 7. Fluids/Electrolytes/Nutrition:encourage PO 8. Seizure disorder. Keppra increased to 1500 mg twice daily and the addition of Vimpat 50 mg twice daily. EEG negative for seizure. Patient to follow-up Dr. Mickeal Skinner as outpatient  -seizure free to date in rehab  -continue sz precautions 9.  Hypoalbuminemia  Supplement initiated on 8/1   LOS: 9 days A FACE TO FACE EVALUATION WAS PERFORMED  Tyshika Baldridge Lorie Phenix 02/15/2019, 11:06 AM

## 2019-02-16 ENCOUNTER — Inpatient Hospital Stay (HOSPITAL_COMMUNITY): Payer: Medicare Other | Admitting: Physical Therapy

## 2019-02-16 ENCOUNTER — Inpatient Hospital Stay (HOSPITAL_COMMUNITY): Payer: Medicare Other

## 2019-02-16 LAB — BASIC METABOLIC PANEL
Anion gap: 11 (ref 5–15)
BUN: 18 mg/dL (ref 8–23)
CO2: 26 mmol/L (ref 22–32)
Calcium: 9.5 mg/dL (ref 8.9–10.3)
Chloride: 99 mmol/L (ref 98–111)
Creatinine, Ser: 0.88 mg/dL (ref 0.61–1.24)
GFR calc Af Amer: >60 mL/min (ref >60)
GFR calc non Af Amer: >60 mL/min (ref >60)
Glucose, Bld: 121 mg/dL — ABNORMAL HIGH (ref 70–99)
Potassium: 4.3 mmol/L (ref 3.5–5.1)
Sodium: 136 mmol/L (ref 135–145)

## 2019-02-16 NOTE — Progress Notes (Signed)
Occupational Therapy Weekly Progress Note  Patient Details  Name: Christopher Morris MRN: [id number] Date of Birth: [birth year]  Beginning of progress report period: February 07, 2019 End of progress report period: February 16, 2019  Today's Date: 02/16/2019 OT Individual Time: 0930-1030 OT Individual Time Calculation (min): 60 min    Patient has met 2 of 3 short term goals.  Pt has made great progress toward his ADL goals this reporting period. With appropriate cueing, pt is able to utilize modified hemi techniques and don LB clothing with CGA. Pt has progressed to min A with dynamic standing balance and with sit <> stands. Pt consistently performs toilet transfers with min A, doing great with grab bar support. Pt continues to have slow processing that impacts his ability to follow commands efficiently at times, and is limited by motor planning deficits, especially with RW management.   Patient continues to demonstrate the following deficits: muscle weakness, decreased cardiorespiratoy endurance, decreased attention to right and decreased motor planning, decreased awareness, decreased problem solving, decreased safety awareness and delayed processing and decreased standing balance and decreased balance strategies and therefore will continue to benefit from skilled OT intervention to enhance overall performance with BADL.  Patient not progressing toward long term goals.  See goal revision..  Plan of care revisions: Several of pt's transfer goals have been downgraded to min A/CGA reflect pt flucutations in ability depending on availabilty of grab bars and stedy assist.  OT Short Term Goals Week 1:  OT Short Term Goal 1 (Week 1): Pt will complete shower transfers with Min A using LRAD OT Short Term Goal 1 - Progress (Week 1): Met OT Short Term Goal 2 (Week 1): Pt will complete 2 grooming tasks while standing at the sink with supervision for balance OT Short Term Goal 2 - Progress (Week 1): Progressing toward  goal OT Short Term Goal 3 (Week 1): Pt will complete toilet transfers with Min A using LRAD OT Short Term Goal 3 - Progress (Week 1): Met Week 2:  OT Short Term Goal 1 (Week 2): STG= LTG d/t ELOS  Skilled Therapeutic Interventions/Progress Updates:    Pt received in recliner wanting to take shower no  C/o pain. Pt required min A to power up from recliner. Pt required mod cueing throughout functional mobility for Rw management. Pt completed transfer into bathroom with CGA. Pt transferred onto toilet with CGA. Pt doffed all clothing, including ted hose with (S) seated. Pt transferred into shower with min A. Pt completed all bathing sit <> stand, majority seated. Pt required cueing for task progression, with motor perseveration's on washing head. Pt transferred out of shower with mod cueing for safety/drying off before transferring. Pt sat in w/c and donned shirt with (S). Min A for donning pants. Pt used RW to complete ambulatory transfer to recliner. Pt was left sitting up with all needs met, chair alarm set.   Therapy Documentation Precautions:  Precautions Precautions: Fall Precaution Comments: R inattention/ apraxia Restrictions Weight Bearing Restrictions: No   Therapy/Group: Individual Therapy  [name] 02/16/2019, 6:45 AM

## 2019-02-16 NOTE — Progress Notes (Signed)
[facility] PHYSICAL MEDICINE & REHABILITATION PROGRESS NOTE   Subjective/Complaints: Up in bed. No new complaints  ROS: Patient denies fever, rash, sore throat, blurred vision, nausea, vomiting, diarrhea, cough, shortness of breath or chest pain, joint or back pain, headache, or mood change.   Objective:   No results found. No results for input(s): WBC, HGB, HCT, PLT in the last 72 hours. No results for input(s): NA, K, CL, CO2, GLUCOSE, BUN, CREATININE, CALCIUM in the last 72 hours.  Intake/Output Summary (Last 24 hours) at 02/16/2019 0947 Last data filed at 02/16/2019 0900 Gross per 24 hour  Intake 480 ml  Output -  Net 480 ml     Physical Exam: Vital Signs Blood pressure (!) 113/57, pulse 62, temperature 98 F (36.7 C), temperature source Oral, resp. rate 16, height 6\' 4"  (1.93 m), weight 95.6 kg, SpO2 97 %. Constitutional: No distress . Vital signs reviewed. HEENT: EOMI, oral membranes moist Neck: supple Cardiovascular: RRR without murmur. No JVD    Respiratory: CTA Bilaterally without wheezes or rales. Normal effort    GI: BS +, non-tender, non-distended  Musc: No edema or tenderness in extremities. Neurological: Alert.  Delayed processing unchanged Motor: Grossly 4/5 stable Psych: Normal mood.  Normal affect.  Assessment/Plan: 1. Functional deficits secondary to metabolic encephalopathy which require 3+ hours per day of interdisciplinary therapy in a comprehensive inpatient rehab setting.  Physiatrist is providing close team supervision and 24 hour management of active medical problems listed below.  Physiatrist and rehab team continue to assess barriers to discharge/monitor patient progress toward functional and medical goals  Care Tool:  Bathing    Body parts bathed by patient: Right arm, Left arm, Chest, Abdomen, Front perineal area, Buttocks, Right upper leg, Left upper leg, Right lower leg, Left lower leg, Face   Body parts bathed by helper: Buttocks      Bathing assist Assist Level: Contact Guard/Touching assist     Upper Body Dressing/Undressing Upper body dressing   What is the patient wearing?: Pull over shirt    Upper body assist Assist Level: Set up assist    Lower Body Dressing/Undressing Lower body dressing      What is the patient wearing?: Pants, Underwear/pull up     Lower body assist Assist for lower body dressing: Contact Guard/Touching assist     Toileting Toileting    Toileting assist Assist for toileting: Contact Guard/Touching assist     Transfers Chair/bed transfer  Transfers assist     Chair/bed transfer assist level: Contact Guard/Touching assist Chair/bed transfer assistive device: Programmer, multimedia   Ambulation assist      Assist level: Contact Guard/Touching assist Assistive device: Walker-rolling Max distance: 10 ft   Walk 10 feet activity   Assist     Assist level: Contact Guard/Touching assist Assistive device: Walker-rolling   Walk 50 feet activity   Assist Walk 50 feet with 2 turns activity did not occur: Safety/medical concerns  Assist level: Contact Guard/Touching assist Assistive device: Walker-rolling    Walk 150 feet activity   Assist Walk 150 feet activity did not occur: Safety/medical concerns  Assist level: Minimal Assistance - Patient > 75% Assistive device: Walker-rolling    Walk 10 feet on uneven surface  activity   Assist Walk 10 feet on uneven surfaces activity did not occur: Safety/medical concerns         Wheelchair     Assist   Type of Wheelchair: Manual    Wheelchair assist level: Minimal Assistance -  Patient > 75% Max wheelchair distance: 47'    Wheelchair 50 feet with 2 turns activity    Assist        Assist Level: Minimal Assistance - Patient > 75%   Wheelchair 150 feet activity     Assist Wheelchair 150 feet activity did not occur: Safety/medical concerns         Medical Problem List and  Plan: 1.Right side weakness with aphasiasecondary to acute metabolic encephalopathy with history of glioblastoma with resection.  Continue CIR, improving 2. Antithrombotics: -DVT/anticoagulation:Continue Eliquis -antiplatelet therapy: N/A 3. Pain Management:Tylenol as needed  Controlled on 8/2 without medication 4. Mood:Provide emotional support -antipsychotic agents: N/A 5. Neuropsych: This patientis not fully capable of making decisions on hisown behalf.  6. Skin/Wound Care:continue local care as needed   BMP within acceptable range on 7/24 7. Fluids/Electrolytes/Nutrition:encourage PO 8. Seizure disorder. Keppra increased to 1500 mg twice daily and the addition of Vimpat 50 mg twice daily. EEG negative for seizure. Patient to follow-up Dr. Mickeal Skinner as outpatient  -seizure free to date in rehab  -continue sz precautions    LOS: 10 days A FACE TO FACE EVALUATION WAS PERFORMED  Christopher Morris 02/16/2019, 9:47 AM

## 2019-02-16 NOTE — Progress Notes (Signed)
Physical Therapy Session Note  Patient Details  Name: Christopher Morris MRN: [id number] Date of Birth: May 14, 1941  Today's Date: 02/16/2019 PT Individual Time: 0810-0905 and 1420-1530 PT Individual Time Calculation (min): 55 min and 70 min  Short Term Goals: Week 2:  PT Short Term Goal 1 (Week 2): STG = LTG due to estimated d/c date.  Skilled Therapeutic Interventions/Progress Updates:  Treatment 1: Pt received in bed & agreeable to tx, denying c/o pain. Pt transfers to sitting EOB with supervision, bed flat, no rails with extra time. Provided assistance for donning ted hose, pants, shoes for time management. Pt requires multiple attempts for sit>stand and ultimately CGA with max cuing 2/2 pt experiencing frequent LOB posteriorly with pt able to recognize deficits in functional transfer but difficulty correcting. Pt dons shirt with extra time. Transported pt to/from gym via w/c dependent assist. Pt negotiates step with RW x 2 trials with min assist with max cuing for sequencing & slightly improved balance when descending step leading with LLE vs RLE. During session pt completes ambulatory transfer around bed to w/c and w/c<>car with RW & CGA with shuffled gait pattern and decreased attention to RLE, also requiring MAX cuing for safe use of AD as pt will often push it too far out in front of him or ambulate with RLE outside of RW. Pt continues to demonstrate significantly impaired attention to R side of body. Pt completes car transfer at small SUV height with CGA. Pt demonstrates very poor eccentric control with stand>sit despite max cuing/education & pt demonstrates very poor awareness of this. Back in room, attempted to have pt practice sit<>stand transfers but pt with impaired ability to scoot buttocks to edge of seat. Pt left in recliner with chair alarm donned & all needs in reach.   Treatment 2: Pt received in recliner & agreeable to tx, no c/o pain reported. Pt requires significantly extra time and  mod assist to scoot to edge of seat & ultimately max assist for sit>stand from recliner. Pt ambulates around bed to door with shuffled gait & pt takes sitting break upon PT request. Therapist provides pt with theraband tied to front of walker in attempts to give pt visual cue for increased step length RLE but pt unable to successfully follow instructions/demo for visual cue to be successful. Pt transfers sit>stand from w/c with CGA & ongoing cuing for anterior weight shifting, and ambulates to nurses station with min increasing to light mod assist with max cuing for forward vs downward gaze with poor return demo & proper gait pattern with pt continuing to demonstrate shuffled gait pattern with inability to correct despite multimodal cuing & demo with pt beginning to push RW too far out in front of him & therapist initiating seated rest break. Pt demonstrates impaired awareness re: gait deficits ("I feel like I'm walking good.") Pt utilized dynavision in standing without UE support and close supervision for balance with pt beginning to demonstrate more R lateral lean as he fatigued with longer duration of task (pt completed 3 minutes x 2 trials). Pt utilized LUE for task and with about equal reaction time to both L/R side of board. Pt demonstrates significantly impaired ability to step back towards w/c and requires MAX assist to control descent for safety. Pt ambulates 40 ft to nu-step with RW & min assist in same shuffled manner. Pt utilized nu-step on level 4 x 10 minutes with all four extremities with task focusing on global strengthening & endurance training, coordination of reciprocal  movements, & R NMR & attention to UE/LE. Pt requires max assist to power up from nu-step seat & MAX assist to safely sit in w/c 2/2 inability to follow one step commands, let go of RW with RUE & control descent to sitting. Pt returned to room via w/c dependent assist for time management and transfers sit>stand with min assist & max  cuing for anterior weight shifting. Pt ambulates around bed to recliner with RW & min assist but max cuing for turning to square up to recliner. Pt left in recliner with alarm set & all needs at hand.   Therapy Documentation Precautions:  Precautions Precautions: Fall Precaution Comments: R inattention/ apraxia Restrictions Weight Bearing Restrictions: No    Therapy/Group: Individual Therapy  [name] 02/16/2019, 3:33 PM

## 2019-02-16 NOTE — Plan of Care (Signed)
Transfer & gait goals downgraded to CGA and stair goal downgraded to min assist with LRAD 2/2 pt's slow progress, impaired cognition, inattention.   Problem: RH Bed Mobility Goal: LTG Patient will perform bed mobility with assist (PT) Description: LTG: Patient will perform bed mobility with assistance, with/without cues (PT). Flowsheets (Taken 02/16/2019 1053) LTG: Pt will perform bed mobility with assistance level of: (downgrade 2/2 slow progress) Supervision/Verbal cueing Note: downgrade 2/2 slow progress   Problem: RH Bed to Chair Transfers Goal: LTG Patient will perform bed/chair transfers w/assist (PT) Description: LTG: Patient will perform bed to chair transfers with assistance (PT). Flowsheets (Taken 02/16/2019 1053) LTG: Pt will perform Bed to Chair Transfers with assistance level: (downgrade 2/2 slow progress) Contact Guard/Touching assist Note: downgrade 2/2 slow progress   Problem: RH Car Transfers Goal: LTG Patient will perform car transfers with assist (PT) Description: LTG: Patient will perform car transfers with assistance (PT). Flowsheets (Taken 02/16/2019 1053) LTG: Pt will perform car transfers with assist:: (downgrade 2/2 slow progress) Contact Guard/Touching assist Note: downgrade 2/2 slow progress   Problem: RH Ambulation Goal: LTG Patient will ambulate in controlled environment (PT) Description: LTG: Patient will ambulate in a controlled environment, # of feet with assistance (PT). Flowsheets (Taken 02/16/2019 1053) LTG: Pt will ambulate in controlled environ  assist needed:: (downgrade 2/2 slow progress) Contact Guard/Touching assist LTG: Ambulation distance in controlled environment: 150 ft with LRAD Note: downgrade 2/2 slow progress Goal: LTG Patient will ambulate in home environment (PT) Description: LTG: Patient will ambulate in home environment, # of feet with assistance (PT). Flowsheets (Taken 02/16/2019 1053) LTG: Pt will ambulate in home environ  assist needed::  (downgrade 2/2 slow progress) Contact Guard/Touching assist LTG: Ambulation distance in home environment: 50 ft with LRAD   Problem: RH Stairs Goal: LTG Patient will ambulate up and down stairs w/assist (PT) Description: LTG: Patient will ambulate up and down # of stairs with assistance (PT) Flowsheets (Taken 02/16/2019 1053) LTG: Pt will ambulate up/down stairs assist needed:: (downgrade 2/2 slow progress) Minimal Assistance - Patient > 75% LTG: Pt will  ambulate up and down number of stairs: 1 step with LRAD for home access Note: downgrade 2/2 slow progress

## 2019-02-16 NOTE — Plan of Care (Signed)
  Problem: Consults Goal: RH STROKE PATIENT EDUCATION Description: See Patient Education module for education specifics  Outcome: Progressing   Problem: RH BLADDER ELIMINATION Goal: RH STG MANAGE BLADDER WITH ASSISTANCE Description: STG Manage Bladder With min Assistance Outcome: Progressing   Problem: RH SAFETY Goal: RH STG ADHERE TO SAFETY PRECAUTIONS W/ASSISTANCE/DEVICE Description: STG Adhere to Safety Precautions With supervision cues/reminders. Outcome: Progressing   Problem: RH KNOWLEDGE DEFICIT Goal: RH STG INCREASE KNOWLEDGE OF STROKE PROPHYLAXIS Description: Pt will be able to direct care at discharge with cues/reminders of dtr regarding secondary stroke prophylaxis and prevention of secondary stroke using handouts and educational materials. Outcome: Progressing

## 2019-02-17 ENCOUNTER — Inpatient Hospital Stay (HOSPITAL_COMMUNITY): Payer: Medicare Other

## 2019-02-17 ENCOUNTER — Inpatient Hospital Stay (HOSPITAL_COMMUNITY): Payer: Medicare Other | Admitting: Physical Therapy

## 2019-02-17 NOTE — Patient Care Conference (Signed)
Inpatient RehabilitationTeam Conference and Plan of Care Update Date: 02/17/2019   Time: 2:35 PM    Patient Name: Christopher Morris      Medical Record Number: [id number]  Date of Birth: Dec 02, 1940 Sex: Male         Room/Bed: 4W06C/4W06C-01 Payor Info: Payor: [employer] / Plan: [employer] MEDICARE / Product Type: *No Product type* /    Admitting Diagnosis: 2. TBI Team  NTBI, enceph, 13-15 days  Admit Date/Time:  02/06/2019  3:07 PM Admission Comments: No comment available   Primary Diagnosis:  <principal problem not specified> Principal Problem: <principal problem not specified>  Patient Active Problem List   Diagnosis Date Noted  . Hypoalbuminemia due to protein-calorie malnutrition ([place])   . Metabolic encephalopathy 00/92/3300  . Seizures ([place]) 02/03/2019  . Right sided weakness 02/02/2019  . Acute metabolic encephalopathy 76/22/6333  . Aphasia 12/29/2018  . Pneumonia 12/22/2017  . HCAP (healthcare-associated pneumonia) 12/22/2017  . Pulmonary embolism ([place]) 12/22/2017  . Tachycardia   . Vitamin B12 deficiency 11/14/2017  . Anemia 11/14/2017  . Fever 11/13/2017  . Dehydration 11/13/2017  . Interstitial pneumonia ([place]) 11/11/2017  . Sepsis ([place]) 11/09/2017  . Bacteremia due to Gram-negative bacteria 10/26/2017  . Sepsis due to Escherichia coli (E. coli) ([place]) 10/26/2017  . UTI (urinary tract infection) 10/25/2017  . Seizure ([place]) 09/25/2017  . Frontal glioblastoma multiforme ([place]) 09/25/2017  . Hyperglycemia 09/25/2017  . BLOOD IN STOOL 07/26/2008  . ERECTILE DYSFUNCTION 07/12/2008    Expected Discharge Date: Expected Discharge Date: 02/21/19  Team Members Present: Physician leading conference: Dr. Alger Simons Social Worker Present: Alfonse Alpers, LCSW;Trynity Skousen Donata Clay,  Nurse Present: Rozetta Nunnery, RN PT Present: Lavone Nian, PT OT Present: Laverle Hobby, OT SLP Present: Weston Anna, SLP PPS Coordinator present : Gunnar Fusi, SLP     Current  Status/Progress Goal Weekly Team Focus  Medical   debilty after seizures, no further seizures.  stabilize medical issues  see above   Bowel/Bladder   pt is incontinent of B/B with periods of continence. LBM 02/15/19  Pt will be continent of B/B  Q2h/PRN toileting.   Swallow/Nutrition/ Hydration             ADL's   min-mod A transfers with RW, requires mod cueing for RW management with no carryover between sessions. Improvements in bathing/dressing, now at (S) UB ADLs, min A LB ADLs for balance support only  Downgraded to min A- CGA to reflect poor carryover/inconsitent performance  RW management, motor planning, pt/family education, ADL retraining, balance, sit <> stands   Mobility   supervision bed mobility, CGA<>min assist sit<>stand, CGA gait with RW, min assist single step negotiation with RW, R UE/LE neglect, impaired motor planning/awareness  downgraded to CGA<>min assist gait & step with LRAD  R NMR & attention, transfers, gait, stair negotiation, balance, bed mobility, strengthening, endurance, awareness   Communication             Safety/Cognition/ Behavioral Observations            Pain   Pt denies pt  Pt will remain free of pain  Assess pain Qshift/ PRN   Skin   Skin intact, no signs of skin breakdown noted.  skin remainintact, free if infection and breakdown.  Assess skin qshift/ PRN    Rehab Goals Patient on target to meet rehab goals: Yes Rehab Goals Revised: Pt's goals were downgraded to CGA/min A. *See Care Plan and progress notes for long and short-term goals.  Barriers to Discharge  Current Status/Progress Possible Resolutions Date Resolved   Physician    Medical stability        n/a      Nursing                  PT  Decreased caregiver support;Home environment access/layout                 OT                  SLP                SW                Discharge Planning/Teaching Needs:  Pt to return to his home where his dtr and grandson will be with  him all the time.  Dtr will come in for family education Thursday and/or Friday.   Team Discussion:  Medically stable overall;  incont hs;  Very poor carryover with tx sessions.  Min-mod assist with tfs;  S-min a with ADLs.  Goals downgraded to min  - CGA with PT/OT.  Right inattention is not improving.  Plan family ed with daughter on Thursday.  Revisions to Treatment Plan:  Some goals downgraded    Continued Need for Acute Rehabilitation Level of Care: The patient requires daily medical management by a physician with specialized training in physical medicine and rehabilitation for the following conditions: Daily direction of a multidisciplinary physical rehabilitation program to ensure safe treatment while eliciting the highest outcome that is of practical value to the patient.: Yes Daily medical management of patient stability for increased activity during participation in an intensive rehabilitation regime.: Yes Daily analysis of laboratory values and/or radiology reports with any subsequent need for medication adjustment of medical intervention for : Neurological problems   I attest that I was present, lead the team conference, and concur with the assessment and plan of the team.   Donata Clay, [employer] 02/17/2019, 4:06 PM    Team conference was held via web/ teleconference due to [place] - 19

## 2019-02-17 NOTE — Progress Notes (Signed)
Occupational Therapy Session Note  Patient Details  Name: Christopher Morris MRN: [id number] Date of Birth: [birth year]  Today's Date: 02/17/2019 OT Individual Time: 1300-1330 OT Individual Time Calculation (min): 30 min   Session 2:  OT Individual Time: 2548-6282 OT Individual Time Calculation (min): 25 min   Short Term Goals: Week 2:  OT Short Term Goal 1 (Week 2): STG= LTG d/t ELOS  Skilled Therapeutic Interventions/Progress Updates:    Session 1: Pt received in recliner agreeable to therapy. Pt completed sit > stand from recliner with min A. He was transported to therapy gym in his w/c. Pt completed 3x 35 ft of functional mobility with focus on stride length, RW management during turns, and upright posture. Trialed using a metronome but saw no difference in use on stride length or speed. When cued to increase speed ,pt did have improved R stride length. Pt required no more than CGA during mobility. Pt had a couple great sit > stands where he required no lifting or stabilization, especially with anterior weight shift cueing. Pt returned to room and left sitting up with all needs met, chair alarm set.   Session 2:  Session focused on ADL transfers and BUE coordination. Pt received in recliner agreeable to therapy. Pt completed sit >stand with min A. Pt used RW to complete functional mobility to w/c, requiring mod cueing for management. Pt was transported to the therapy gym. Pt sat EOM and held 1 kg medicine ball bimanually. Pt completed PNF diagonals, requiring verbal and tactile cues for RUE to stay on ball when reaching into L upper quadrant. Pt also completed large circles with ball, requiring cueing for positioning. Pt returned to room and left sitting up with all needs met, chair alarm set.   Therapy Documentation Precautions:  Precautions Precautions: Fall Precaution Comments: R inattention/ apraxia Restrictions Weight Bearing Restrictions: No  Therapy/Group: Individual  Therapy  [name] 02/17/2019, 4:42 PM

## 2019-02-17 NOTE — Progress Notes (Addendum)
Physical Therapy Session Note  Patient Details  Name: Christopher Morris MRN: [id number] Date of Birth: 1941/02/28  Today's Date: 02/17/2019 PT Individual Time: [phone] and 0240-9735 PT Individual Time Calculation (min): 55 min and 25 min  Short Term Goals: Week 2:  PT Short Term Goal 1 (Week 2): STG = LTG due to estimated d/c date.  Skilled Therapeutic Interventions/Progress Updates:  Treatment 1: Pt received in bed & agreeable to tx, no c/o pain reported. Pt transfers supine>sitting EOB with supervision, bed flat, no rails with extra time. Pt continues to demonstrate R neglect as he demonstrates very little use of R side during functional tasks (RUE during supine>sit & pulling pants over hips). Once sitting EOB pt observed to be incontinent of urine & pt unaware despite saturated brief. Pt tranfers sit>stand with CGA<>Min assist and cuing for anterior weight shifting & occasional cuing for hand placement. Therapist doffed soiled brief & pt began urinating again. Provided pt with urinal & pt with small continent void while sitting EOB. Therapist provides total assist for donning clean brief, pants, ted hose & shoes for time management with max cuing to attend to RLE during dressing. Pt performed hand hygiene at sink from w/c level with assistance to raise RUE to obtain soap. Pt propelled w/c ~85 ft with BLE only with cuing for technique with task focusing on reciprocal movements of BLE. In dayroom pt ambulates ~80 ft + 80 ft + 125 with RW & min assist with therapist providing manual facilitation for weight shifting L to allow increased foot clearance RLE with pt demonstrating improved step length & foot clearance RLE. Pt also demonstrates improvement in gait pattern when cued to ambulate at slightly increased gait speed, although gait pattern does become more impaired as pt fatigues. Pt also requires max cuing and mod assist for managing RW during turns as pt will ambulate with feet outside of base of AD.  At end of session pt left in recliner with chair alarm donned & NT in room.  Treatment 2: Pt received asleep in recliner but easily awakened & agreeable to tx. No c/o pain reported. Pt transfers sit<>stand with min/mod assist from recliner & w/c with cuing for anterior weight shifting & assist to correct posterior LOB. Pt ambulates around recliner<>w/c with RW & min assist with therapist providing cuing for increased gait speed and attempting to manually facilitate weight shifting L for increased step length & foot clearance RLE. Transported pt to/from gym via w/c dependent assist for time management. Pt transfers w/c<>mat with stand pivot min assist with RW & extra time. Standing EOM pt engaged in 3" step taps with BUE support on RW & CGA with task focusing on weight shifting L<>R. While sitting EOM pt then performed PNF patterns with BUE with 2 kg weighted ball, 2 sets x 10 reps each direction, with multimodal cuing for technique with task focusing on RUE strengthening & NMR. At end of session pt left sitting in recliner with chair alarm donned & all needs at hand.  Therapy Documentation Precautions:  Precautions Precautions: Fall Precaution Comments: R inattention/ apraxia Restrictions Weight Bearing Restrictions: No    Therapy/Group: Individual Therapy  [name] 02/17/2019, 3:43 PM

## 2019-02-17 NOTE — Progress Notes (Signed)
Occupational Therapy Session Note  Patient Details  Name: Frederik Standley MRN: [id number] Date of Birth: 1940/12/07  Today's Date: 02/17/2019 OT Individual Time: 1100-1200 OT Individual Time Calculation (min): 60 min    Short Term Goals: Week 2:  OT Short Term Goal 1 (Week 2): STG= LTG d/t ELOS  Skilled Therapeutic Interventions/Progress Updates:    Pt received in recliner agreeable to session. Pt required min A to power up, using RW for support. Pt completed functional mobility into bathroom with min A balance support, using RW. Pt transferred onto toilet with CGA, using grab bar on the L. Pt completed peri hygiene following BM with CGA. Pt transferred into shower and recalled OT statement from previous day saying "yesterday I washed my head 6 times". Pt demonstrated improved task progression this session in shower, initiating and terminating washing each body part appropriately. Pt transferred out of shower and to w/c. Pt donned shirt, initially forgetting R arm and with questioning cue, he was able to correct. Pt began threading LLE into pants and then independently recalled hemi dressing technique!! Pt able to don underwear/pants with min A to stabilize balance. Pt required additional cueing to maintain balance before reaching for clothing management, often attempting to pull pants up while sitting establishing BOS. Pt required cueing for R UE to participate in dressing. Pt stood at sink and completed oral care with no LOB. Pt completed functional mobility back to recliner. Pt was left sitting up with lunch present, chair alarm set.   Therapy Documentation Precautions:  Precautions Precautions: Fall Precaution Comments: R inattention/ apraxia Restrictions Weight Bearing Restrictions: No   Therapy/Group: Individual Therapy  [name] 02/17/2019, 12:09 PM

## 2019-02-17 NOTE — Progress Notes (Signed)
[place] PHYSICAL MEDICINE & REHABILITATION PROGRESS NOTE   Subjective/Complaints: No new issues. Today. Comfortable in bed  ROS: Patient denies fever, rash, sore throat, blurred vision, nausea, vomiting, diarrhea, cough, shortness of breath or chest pain, joint or back pain, headache, or mood change.    Objective:   No results found. No results for input(s): WBC, HGB, HCT, PLT in the last 72 hours. Recent Labs    02/16/19 1226  NA 136  K 4.3  CL 99  CO2 26  GLUCOSE 121*  BUN 18  CREATININE 0.88  CALCIUM 9.5    Intake/Output Summary (Last 24 hours) at 02/17/2019 0932 Last data filed at 02/17/2019 0736 Gross per 24 hour  Intake 700 ml  Output -  Net 700 ml     Physical Exam: Vital Signs Blood pressure 107/65, pulse 63, temperature 98.1 F (36.7 C), temperature source Oral, resp. rate 18, height 6\' 4"  (1.93 m), weight 95.6 kg, SpO2 100 %. Constitutional: No distress . Vital signs reviewed. HEENT: EOMI, oral membranes moist Neck: supple Cardiovascular: RRR without murmur. No JVD    Respiratory: CTA Bilaterally without wheezes or rales. Normal effort    GI: BS +, non-tender, non-distended  Musc: No edema or tenderness in extremities. Neurological: Alert.  Slow processing Motor: Grossly 4/5 stable Psych: Normal mood.  Normal affect.  Assessment/Plan: 1. Functional deficits secondary to metabolic encephalopathy which require 3+ hours per day of interdisciplinary therapy in a comprehensive inpatient rehab setting.  Physiatrist is providing close team supervision and 24 hour management of active medical problems listed below.  Physiatrist and rehab team continue to assess barriers to discharge/monitor patient progress toward functional and medical goals  Care Tool:  Bathing    Body parts bathed by patient: Right arm, Left arm, Chest, Abdomen, Front perineal area, Buttocks, Right upper leg, Left upper leg, Right lower leg, Left lower leg, Face   Body parts bathed by  helper: Buttocks     Bathing assist Assist Level: Contact Guard/Touching assist     Upper Body Dressing/Undressing Upper body dressing   What is the patient wearing?: Pull over shirt    Upper body assist Assist Level: Set up assist    Lower Body Dressing/Undressing Lower body dressing      What is the patient wearing?: Pants, Underwear/pull up     Lower body assist Assist for lower body dressing: Minimal Assistance - Patient > 75%     Toileting Toileting    Toileting assist Assist for toileting: Contact Guard/Touching assist     Transfers Chair/bed transfer  Transfers assist     Chair/bed transfer assist level: Contact Guard/Touching assist Chair/bed transfer assistive device: Programmer, multimedia   Ambulation assist      Assist level: Minimal Assistance - Patient > 75% Assistive device: Walker-rolling Max distance: 100 ft   Walk 10 feet activity   Assist     Assist level: Minimal Assistance - Patient > 75% Assistive device: Walker-rolling   Walk 50 feet activity   Assist Walk 50 feet with 2 turns activity did not occur: Safety/medical concerns  Assist level: Minimal Assistance - Patient > 75% Assistive device: Walker-rolling    Walk 150 feet activity   Assist Walk 150 feet activity did not occur: Safety/medical concerns  Assist level: Minimal Assistance - Patient > 75% Assistive device: Walker-rolling    Walk 10 feet on uneven surface  activity   Assist Walk 10 feet on uneven surfaces activity did not occur: Safety/medical concerns  Wheelchair     Assist   Type of Wheelchair: Manual    Wheelchair assist level: Minimal Assistance - Patient > 75% Max wheelchair distance: 50'    Wheelchair 50 feet with 2 turns activity    Assist        Assist Level: Minimal Assistance - Patient > 75%   Wheelchair 150 feet activity     Assist Wheelchair 150 feet activity did not occur: Safety/medical  concerns         Medical Problem List and Plan: 1.Right side weakness with aphasiasecondary to acute metabolic encephalopathy with history of glioblastoma with resection.  Continue CIR, team conference today 2. Antithrombotics: -DVT/anticoagulation:Continue Eliquis -antiplatelet therapy: N/A 3. Pain Management:Tylenol as needed  Controlled on 8/2 without medication 4. Mood:Provide emotional support -antipsychotic agents: N/A 5. Neuropsych: This patientis not fully capable of making decisions on hisown behalf.  6. Skin/Wound Care:continue local care as needed   BMP within acceptable range on 7/24 7. Fluids/Electrolytes/Nutrition:encourage PO 8. Seizure disorder. Keppra increased to 1500 mg twice daily and the addition of Vimpat 50 mg twice daily. EEG negative for seizure. Patient to follow-up Dr. Mickeal Skinner as outpatient  -seizure free to date in rehab  -continue sz precautions    LOS: 11 days A FACE TO FACE EVALUATION WAS PERFORMED  Christopher Morris 02/17/2019, 9:32 AM

## 2019-02-18 ENCOUNTER — Inpatient Hospital Stay (HOSPITAL_COMMUNITY): Payer: Medicare Other | Admitting: Physical Therapy

## 2019-02-18 ENCOUNTER — Inpatient Hospital Stay (HOSPITAL_COMMUNITY): Payer: Medicare Other

## 2019-02-18 NOTE — Progress Notes (Signed)
Occupational Therapy Session Note  Patient Details  Name: Christopher Morris MRN: [id number] Date of Birth: 1941-03-30  Today's Date: 02/18/2019 OT Individual Time: 1015-1100 OT Individual Time Calculation (min): 45 min    Short Term Goals: Week 1:  OT Short Term Goal 1 (Week 1): Pt will complete shower transfers with Min A using LRAD OT Short Term Goal 1 - Progress (Week 1): Met OT Short Term Goal 2 (Week 1): Pt will complete 2 grooming tasks while standing at the sink with supervision for balance OT Short Term Goal 2 - Progress (Week 1): Progressing toward goal OT Short Term Goal 3 (Week 1): Pt will complete toilet transfers with Min A using LRAD OT Short Term Goal 3 - Progress (Week 1): Met  Skilled Therapeutic Interventions/Progress Updates:    1;1. Pt received in recliner with no c/o pain reporting already showered and ready to go. Pt completes stand pivot transfer with MIN A and VC for pivoting to R and not sitting too soon. Pt completes standing with RW at dynavision with RUE for 64mn (reaction time- 5.4 seconds) and 1 min with LUE (2.7 reaction time) with S-CGA for standing balance. No large discrepincy in time R v L hemisphere. Pt completes turning over scrabble tiles and placing onto scrabble board at midline with forced scanning pattern of L>R like reading for forced use of RUE, FLittle Yorkand NMR with max tactile cues to only use RUE. Pt completes palm<finger translation of scrabble tiles for improved FMC. Exited session with pt seated in recliner, call light in reach and all needs met  Therapy Documentation Precautions:  Precautions Precautions: Fall Precaution Comments: R inattention/ apraxia Restrictions Weight Bearing Restrictions: No General:   Vital Signs:   Pain: Pain Assessment Pain Scale: 0-10 Pain Score: 0-No pain ADL: ADL Eating: Not assessed Grooming: Contact guard Where Assessed-Grooming: Standing at sink Upper Body Bathing: Supervision/safety Lower Body Bathing:  Contact guard Where Assessed-Lower Body Bathing: Shower Upper Body Dressing: Supervision/safety Where Assessed-Upper Body Dressing: Wheelchair Lower Body Dressing: Moderate assistance Where Assessed-Lower Body Dressing: Edge of bed Toileting: Minimal assistance Where Assessed-Toileting: TGlass blower/designer Moderate assistance Toilet Transfer Method: Ambulating(RW) WSocial research officer, government Moderate assistance WSocial research officer, governmentMethod: Ambulating(RW) Vision   Perception    Praxis   Exercises:   Other Treatments:     Therapy/Group: Individual Therapy  STonny Branch8/11/2018, 10:37 AM

## 2019-02-18 NOTE — Progress Notes (Signed)
Occupational Therapy Session Note  Patient Details  Name: Christopher Morris MRN: [id number] Date of Birth: 01-Sep-1940  Today's Date: 02/18/2019 OT Individual Time: [id number] OT Individual Time Calculation (min): 75 min    Short Term Goals: Week 2:  OT Short Term Goal 1 (Week 2): STG= LTG d/t ELOS  Skilled Therapeutic Interventions/Progress Updates:    Pt received supine, easily awoken to vc, smiling and ready for therapy. Pt completed bed mobility with (S). With bed height raised pt able to complete sit > stand with (S)! Cueing provided for anterior weight shift. Pt completed ambulatory transfer into bathroom and transferred onto toilet, independently recalling previous cueing stating "no flopping!" during descent to toilet. Discussed with pt benefit of grab bar support and how much it helps him to have stable UE support. Pt voided (+BM) and completed peri hygiene with close (S). Pt completed transfer into shower onto TTB with CGA, good navigation and only min cueing required. Pt completed all bathing with no cueing- great improvement from prior sessions. Pt thoroughly washed all body parts and appropriately initiated/terminated tasks. Pt transferred back to w/c from shower. Pt independently initiated threading R LE into depends first but then became confused on how to thread LLE. Mod cueing provided, no physical assist and pt was able to reorient and finish task. Pt stood with CGA, cueing for anterior weight shift. Pt pulled up LB clothing in standing with cueing for use of RUE. Pt completed oral care at the sink in standing with close (S). Pt had intermittent posterior lean which he corrected. Min facilitation required for RUE to reach back for w/c, along with L, and with this pt was able to control descent much more  Slowly. Pt was transported to the therapy gym. Pt stood at [employer] and held an expo marker in his R hand. Pt was guided through diagonal movements on mirror to encourage crossing midline. Pt  returned to his room and left sitting in the recliner with all needs met. Chair alarm set.   Therapy Documentation Precautions:  Precautions Precautions: Fall Precaution Comments: R inattention/ apraxia Restrictions Weight Bearing Restrictions: No   Therapy/Group: Individual Therapy  [name] 02/18/2019, 7:05 AM

## 2019-02-18 NOTE — Progress Notes (Signed)
Patient ID: Christopher Morris, male   DOB: [birth year], 78 y.o.   MRN: [id number]      Diagnosis codes:    R53.81; G93.41; G40.409  Height:     5'11"           Weight:      215 lbs      Patient suffers from malaise following metabolic encephalopathy and generalized epilepsy and epileptic syndrome which impairs his ability to perform daily activities like bathing, dressing, grooming, toileting, and feeding in the home.  A walker or cane  will not resolve issue with performing activities of daily living.  A wheelchair will allow patient to safely perform daily activities.  Patient is not able to propel himself in the home using a standard weight wheelchair due to general weakness and endurance.  Patient can self propel in the lightweight wheelchair.

## 2019-02-18 NOTE — Progress Notes (Signed)
Occupational Therapy Session Note  Patient Details  Name: Christopher Morris MRN: [id number] Date of Birth: 25-Feb-1941  Today's Date: 02/18/2019 OT Individual Time: 1415-1511 OT Individual Time Calculation (min): 56 min    Short Term Goals: Week 1:  OT Short Term Goal 1 (Week 1): Pt will complete shower transfers with Min A using LRAD OT Short Term Goal 1 - Progress (Week 1): Met OT Short Term Goal 2 (Week 1): Pt will complete 2 grooming tasks while standing at the sink with supervision for balance OT Short Term Goal 2 - Progress (Week 1): Progressing toward goal OT Short Term Goal 3 (Week 1): Pt will complete toilet transfers with Min A using LRAD OT Short Term Goal 3 - Progress (Week 1): Met  Skilled Therapeutic Interventions/Progress Updates:    1:1. Pt received in recliner with no c/o pain. Pt agreeable to tx. Pt ambulates with shuffling gait despite multimodal cueing for lifting RUE off floor with MIN A. Pt completes sit to stand while playing corn hole with MIN A and manual facilitation of weight shift forward during transitional movement and forced use of RUE for coronale activity. Pt completes 3x15 shuttle pass activity for gross motor control of RUE. Pt demo decreased strength and ROM in R shoulder. Prior to activity with shuttle OT stretches shoulder, however pt demo decreased understanding of stretches often attempting to push against OT. Pt sits to fold 4 towels reaching R with VC for use of RUE to reach to obtain towels and min A for shoulder flexion to improve efficiency of folding/even BUE use. Exited session with pt seated in recliner, call light in reach and all needs met.   Therapy Documentation Precautions:  Precautions Precautions: Fall Precaution Comments: R inattention/ apraxia Restrictions Weight Bearing Restrictions: No General:   Vital Signs: Therapy Vitals Temp: 97.8 F (36.6 C) Temp Source: Oral Pulse Rate: 73 Resp: 18 BP: 127/66 Patient Position (if  appropriate): Sitting Oxygen Therapy SpO2: 98 % O2 Device: Room Air Pain:   ADL: ADL Eating: Not assessed Grooming: Contact guard Where Assessed-Grooming: Standing at sink Upper Body Bathing: Supervision/safety Lower Body Bathing: Contact guard Where Assessed-Lower Body Bathing: Shower Upper Body Dressing: Supervision/safety Where Assessed-Upper Body Dressing: Wheelchair Lower Body Dressing: Moderate assistance Where Assessed-Lower Body Dressing: Edge of bed Toileting: Minimal assistance Where Assessed-Toileting: Glass blower/designer: Moderate assistance Toilet Transfer Method: Ambulating(RW) Social research officer, government: Moderate assistance Social research officer, government Method: Ambulating(RW) Vision   Perception    Praxis   Exercises:   Other Treatments:     Therapy/Group: Individual Therapy  Tonny Branch 02/18/2019, 2:31 PM

## 2019-02-18 NOTE — Progress Notes (Signed)
Physical Therapy Session Note  Patient Details  Name: Abshir Paolini MRN: [id number] Date of Birth: [birth year]  Today's Date: 02/18/2019 PT Individual Time: 1761-6073 PT Individual Time Calculation (min): 40 min   Short Term Goals: Week 2:  PT Short Term Goal 1 (Week 2): STG = LTG due to estimated d/c date.  Skilled Therapeutic Interventions/Progress Updates:  Pt received in recliner & agreeable to tx. No c/o pain reported. Pt transfers sit>stand with min assist with max cuing for increased anterior weight shifting to counteract posterior LOB. Pt ambulates around bed to w/c with RW & min assist. Pt negotiates 6" step with RW x 2 trials with min assist with improving ability to manage RW & follow commands in regards to feet position. Pt ambulates 100 ft with min assist with RW with therapist providing slight manual facilitation to L on this date with improving step length RLE. Pt ambulates dayroom>room (170 ft) with RW & CGA with occasional cuing for increased gait speed. At end of session pt left in recliner with chair alarm donned & call bell in lap.  Therapy Documentation Precautions:  Precautions Precautions: Fall Precaution Comments: R inattention/ apraxia Restrictions Weight Bearing Restrictions: No     Therapy/Group: Individual Therapy  [name] 02/18/2019, 1:51 PM

## 2019-02-19 ENCOUNTER — Inpatient Hospital Stay (HOSPITAL_COMMUNITY): Payer: Medicare Other

## 2019-02-19 ENCOUNTER — Encounter (HOSPITAL_COMMUNITY): Payer: Medicare Other

## 2019-02-19 ENCOUNTER — Ambulatory Visit (HOSPITAL_COMMUNITY): Payer: Medicare Other

## 2019-02-19 MED ORDER — ADULT MULTIVITAMIN W/MINERALS CH
1.0000 | ORAL_TABLET | Freq: Every day | ORAL | Status: DC
  Administered 2019-02-20 – 2019-02-21 (×2): 1 via ORAL
  Filled 2019-02-19 (×2): qty 1

## 2019-02-19 NOTE — Progress Notes (Signed)
Occupational Therapy Session Note  Patient Details  Name: Christopher Morris MRN: [id number] Date of Birth: 1941/06/27  Today's Date: 02/19/2019 OT Individual Time: 1506-1601 OT Individual Time Calculation (min): 55 min    Short Term Goals: Week 1:  OT Short Term Goal 1 (Week 1): Pt will complete shower transfers with Min A using LRAD OT Short Term Goal 1 - Progress (Week 1): Met OT Short Term Goal 2 (Week 1): Pt will complete 2 grooming tasks while standing at the sink with supervision for balance OT Short Term Goal 2 - Progress (Week 1): Progressing toward goal OT Short Term Goal 3 (Week 1): Pt will complete toilet transfers with Min A using LRAD OT Short Term Goal 3 - Progress (Week 1): Met  Skilled Therapeutic Interventions/Progress Updates:    1:1. Pt received in recliner with daughter present. OT and pt ambulate to w/c with OT demoing tactile/VC for R attention, RW management, R stepping, safe reach back and w/c parts management. Pt and OT demo shower transfer after lengthy discussion of use of shower chair, grab bars and HHA for bathing as well as toilet being in the way TTB being opposite of faucet. [name] (daughter) return demo posterior method of shower transfer providing MIN A and multimodal cues for R attention and RW management with MIN VC from OT for placement of body in regards to pt when turning. Pt and [name] verbalize understanding to complete dyr run of shower transfer prior to practice without water involved. OT reviews other activities/FMC games and CIMT to complete at home with oven mit on L hand for R attention/forced use. Exited session with pt seated in recliner, call light in reach and all needs met.   Therapy Documentation Precautions:  Precautions Precautions: Fall Precaution Comments: R inattention/ apraxia Restrictions Weight Bearing Restrictions: No General:   Vital Signs: Therapy Vitals Temp: 98 F (36.7 C) Temp Source: Oral Pulse Rate: 67 Resp: 18 BP:  120/69 Patient Position (if appropriate): Sitting Oxygen Therapy SpO2: 93 % O2 Device: Room Air Pain:   ADL: ADL Eating: Not assessed Grooming: Contact guard Where Assessed-Grooming: Standing at sink Upper Body Bathing: Supervision/safety Lower Body Bathing: Contact guard Where Assessed-Lower Body Bathing: Shower Upper Body Dressing: Supervision/safety Where Assessed-Upper Body Dressing: Wheelchair Lower Body Dressing: Moderate assistance Where Assessed-Lower Body Dressing: Edge of bed Toileting: Minimal assistance Where Assessed-Toileting: Glass blower/designer: Moderate assistance Toilet Transfer Method: Ambulating(RW) Social research officer, government: Moderate assistance Social research officer, government Method: Ambulating(RW) Vision   Perception    Praxis   Exercises:   Other Treatments:     Therapy/Group: Individual Therapy  Tonny Branch 02/19/2019, 4:13 PM

## 2019-02-19 NOTE — Progress Notes (Signed)
[place] PHYSICAL MEDICINE & REHABILITATION PROGRESS NOTE   Subjective/Complaints: Feeling well. No new complaints. Sleeping well. No pain  ROS: Patient denies fever, rash, sore throat, blurred vision, nausea, vomiting, diarrhea, cough, shortness of breath or chest pain, joint or back pain, headache, or mood change.    Objective:   No results found. No results for input(s): WBC, HGB, HCT, PLT in the last 72 hours. Recent Labs    02/16/19 1226  NA 136  K 4.3  CL 99  CO2 26  GLUCOSE 121*  BUN 18  CREATININE 0.88  CALCIUM 9.5    Intake/Output Summary (Last 24 hours) at 02/19/2019 1109 Last data filed at 02/19/2019 0819 Gross per 24 hour  Intake 800 ml  Output -  Net 800 ml     Physical Exam: Vital Signs Blood pressure 117/63, pulse 61, temperature 98.3 F (36.8 C), temperature source Oral, resp. rate 16, height 6\' 4"  (1.93 m), weight 96.2 kg, SpO2 99 %. Constitutional: No distress . Vital signs reviewed. HEENT: EOMI, oral membranes moist Neck: supple Cardiovascular: RRR without murmur. No JVD    Respiratory: CTA Bilaterally without wheezes or rales. Normal effort    GI: BS +, non-tender, non-distended   Musc: No edema or tenderness in extremities. Neurological: Alert.  Slow processing Motor: Grossly 4/5 no change, some motor planning issues Psych: Normal mood.  Normal affect.  Assessment/Plan: 1. Functional deficits secondary to metabolic encephalopathy which require 3+ hours per day of interdisciplinary therapy in a comprehensive inpatient rehab setting.  Physiatrist is providing close team supervision and 24 hour management of active medical problems listed below.  Physiatrist and rehab team continue to assess barriers to discharge/monitor patient progress toward functional and medical goals  Care Tool:  Bathing    Body parts bathed by patient: Right arm, Left arm, Chest, Abdomen, Front perineal area, Buttocks, Right upper leg, Left upper leg, Right lower  leg, Left lower leg, Face   Body parts bathed by helper: Buttocks     Bathing assist Assist Level: Contact Guard/Touching assist     Upper Body Dressing/Undressing Upper body dressing   What is the patient wearing?: Pull over shirt    Upper body assist Assist Level: Set up assist    Lower Body Dressing/Undressing Lower body dressing      What is the patient wearing?: Pants, Underwear/pull up     Lower body assist Assist for lower body dressing: Contact Guard/Touching assist     Toileting Toileting    Toileting assist Assist for toileting: Contact Guard/Touching assist     Transfers Chair/bed transfer  Transfers assist     Chair/bed transfer assist level: Minimal Assistance - Patient > 75% Chair/bed transfer assistive device: Programmer, multimedia   Ambulation assist      Assist level: Contact Guard/Touching assist Assistive device: Walker-rolling Max distance: 170 ft   Walk 10 feet activity   Assist     Assist level: Contact Guard/Touching assist Assistive device: Walker-rolling   Walk 50 feet activity   Assist Walk 50 feet with 2 turns activity did not occur: Safety/medical concerns  Assist level: Contact Guard/Touching assist Assistive device: Walker-rolling    Walk 150 feet activity   Assist Walk 150 feet activity did not occur: Safety/medical concerns  Assist level: Contact Guard/Touching assist Assistive device: Walker-rolling    Walk 10 feet on uneven surface  activity   Assist Walk 10 feet on uneven surfaces activity did not occur: Safety/medical concerns  Wheelchair     Assist   Type of Wheelchair: Agricultural engineer assist level: Supervision/Verbal cueing Max wheelchair distance: 62 ft    Wheelchair 50 feet with 2 turns activity    Assist        Assist Level: Supervision/Verbal cueing(BLE)   Wheelchair 150 feet activity     Assist Wheelchair 150 feet activity did not occur:  Safety/medical concerns         Medical Problem List and Plan: 1.Right side weakness with aphasiasecondary to acute metabolic encephalopathy with history of glioblastoma with resection.  Continue CIR  Progressing toward goals 2. Antithrombotics: -DVT/anticoagulation:Continue Eliquis -antiplatelet therapy: N/A 3. Pain Management:Tylenol as needed  Controlled on 8/2 without medication 4. Mood:Provide emotional support -antipsychotic agents: N/A 5. Neuropsych: This patientis not fully capable of making decisions on hisown behalf.  6. Skin/Wound Care:continue local care as needed   BMP within acceptable range on 7/24 7. Fluids/Electrolytes/Nutrition:encourage PO 8. Seizure disorder. Keppra increased to 1500 mg twice daily and the addition of Vimpat 50 mg twice daily. EEG negative for seizure. Patient to follow-up Dr. Mickeal Skinner as outpatient  -seizure free to date in rehab  -continue sz precautions    LOS: 13 days A FACE TO FACE EVALUATION WAS PERFORMED  Meredith Staggers 02/19/2019, 11:09 AM

## 2019-02-19 NOTE — Progress Notes (Addendum)
Physical Therapy Session Note  Patient Details  Name: Christopher Morris MRN: [id number] Date of Birth: [birth year]  Today's Date: 02/19/2019 PT Individual Time: 0800-0830, 1400-1500 PT Individual Time Calculation (min): 30 min , 60 min  Short Term Goals: Week 1:  PT Short Term Goal 1 (Week 1): STG=LTG due to ELOS  Skilled Therapeutic Interventions/Progress Updates:   tx 1:  Pt resting in bed.  He denied pain.  tx focused on neuromuscular re-education via multimodal cues and visual feedback for:  Supine: R hip rotations, R ankle pumps, bil alternating ankle pumps, R/L straight leg raises focusing on eccentric control, cervical flexion, bil bridging, attempted R unilateral bridging but pt unable apparently due to apraxia., alternating reciprocal R/L heel slides.  In flat bed without rail, min assist for supine> sit.  Sit> stand from raised bed with min assist; min assist stand pivot to w/c.  At end of session, pt resting in w/c with seat belt alarm set and needs at hand.  Ed, RN dispensing meds.  tx 2:  Pt resting in bed.  Pt reported needing to use toilet. Sit> stand from recliner with min assist.  Gait into BR using RW iwht CGA, cues for longer R step length. Toilet transfer with RW, wall bar CGA.  Pt managed clothes with supervision for balance. Pt voided continently.  Hand washing at sink with cues.   His dtr Christopher Morris arrive for family ed.  PT demonstrated simulated car transfer, basic transfer, bed mobility, ambulation including turns, with RW on level tile and 4" step for home entry. She return demonstrated safe assistance for pt ambulating on level tile x 100' including turns, up/down 1 step with RW for home entry, simulated car transfer to 27" seat height.  PT educated [name] about folding w/c and cushion and wc parts mgt.    Pt stepped up with L foot and stepped down with L foot when managing 4" step.  When attempting to step down with R foot, he "froze"; he managed stepping down with L  foot with better fluidity and confidence, without RLE instability.    At end of session, pt resting in recliner as Christopher Morris, OT entered room.     Therapy Documentation Precautions:  Precautions Precautions: Fall Precaution Comments: R inattention/ apraxia Restrictions Weight Bearing Restrictions: No  Pain: pt denies AM and PM        Therapy/Group: Individual Therapy  Alaster Asfaw 02/19/2019, 8:36 AM

## 2019-02-19 NOTE — Progress Notes (Signed)
Occupational Therapy Session Note  Patient Details  Name: Christopher Morris MRN: [id number] Date of Birth: 01/19/1941  Today's Date: 02/19/2019 OT Individual Time: 0900-1000 OT Individual Time Calculation (min): 60 min    Short Term Goals: Week 1:  OT Short Term Goal 1 (Week 1): Pt will complete shower transfers with Min A using LRAD OT Short Term Goal 1 - Progress (Week 1): Met OT Short Term Goal 2 (Week 1): Pt will complete 2 grooming tasks while standing at the sink with supervision for balance OT Short Term Goal 2 - Progress (Week 1): Progressing toward goal OT Short Term Goal 3 (Week 1): Pt will complete toilet transfers with Min A using LRAD OT Short Term Goal 3 - Progress (Week 1): Met  Skilled Therapeutic Interventions/Progress Updates:    1:1. Pt received in w/c with no c/o pain and agreeable to shower. Pt completes ambulation into bathroom with RW to toilet for t4ransfer with CGA and VC for keeping B feet inside RW. Pt urinates on toilet seated with S. Pt completes stand pivot transfer into shower with CGA and bathes at sit to stand level with grab bar with S washing all body parts with no VC for sequencing. Pt dresses UB with set up and LB with S at sit to stand level with sink provided to steady self. Pt able to locate all items on R and use hemi dressing techniques without VC. Pt completes grooming in standing with S. Pt completes standing reaching activity placing cups from sink level to cabinet at head height with S overall and tactile cues to use RUE for NMR. Exited session with pt seated in bed, exit alarm on and call light in reach  Therapy Documentation Precautions:  Precautions Precautions: Fall Precaution Comments: R inattention/ apraxia Restrictions Weight Bearing Restrictions: No General:   Vital Signs: Therapy Vitals Temp: 98.3 F (36.8 C) Temp Source: Oral Pulse Rate: 61 Resp: 16 BP: 117/63 Patient Position (if appropriate): Lying Oxygen Therapy SpO2: 99  % O2 Device: Room Air Pain:   ADL: ADL Eating: Not assessed Grooming: Contact guard Where Assessed-Grooming: Standing at sink Upper Body Bathing: Supervision/safety Lower Body Bathing: Contact guard Where Assessed-Lower Body Bathing: Shower Upper Body Dressing: Supervision/safety Where Assessed-Upper Body Dressing: Wheelchair Lower Body Dressing: Moderate assistance Where Assessed-Lower Body Dressing: Edge of bed Toileting: Minimal assistance Where Assessed-Toileting: Toilet Toilet Transfer: Moderate assistance Toilet Transfer Method: Ambulating(RW) Walk-In Shower Transfer: Moderate assistance Walk-In Shower Transfer Method: Ambulating(RW) Vision   Perception    Praxis   Exercises:   Other Treatments:     Therapy/Group: Individual Therapy   M  02/19/2019, 9:35 AM  

## 2019-02-20 ENCOUNTER — Inpatient Hospital Stay (HOSPITAL_COMMUNITY): Payer: Medicare Other

## 2019-02-20 ENCOUNTER — Encounter (HOSPITAL_COMMUNITY): Payer: Medicare Other

## 2019-02-20 ENCOUNTER — Ambulatory Visit (HOSPITAL_COMMUNITY): Payer: Medicare Other | Admitting: Physical Therapy

## 2019-02-20 LAB — CBC
HCT: 40.2 % (ref 39.0–52.0)
Hemoglobin: 13.5 g/dL (ref 13.0–17.0)
MCH: 30 pg (ref 26.0–34.0)
MCHC: 33.6 g/dL (ref 30.0–36.0)
MCV: 89.3 fL (ref 80.0–100.0)
Platelets: 220 10*3/uL (ref 150–400)
RBC: 4.5 MIL/uL (ref 4.22–5.81)
RDW: 12 % (ref 11.5–15.5)
WBC: 6.4 10*3/uL (ref 4.0–10.5)
nRBC: 0 % (ref 0.0–0.2)

## 2019-02-20 MED ORDER — LACOSAMIDE 50 MG PO TABS: 50.0000 mg | ORAL_TABLET | Freq: Two times a day (BID) | ORAL | 0 refills | Status: DC

## 2019-02-20 MED ORDER — APIXABAN 5 MG PO TABS: 5.0000 mg | ORAL_TABLET | Freq: Two times a day (BID) | ORAL | 0 refills | Status: DC

## 2019-02-20 MED ORDER — LEVETIRACETAM 250 MG PO TABS: 1500.0000 mg | ORAL_TABLET | Freq: Two times a day (BID) | ORAL | Status: DC

## 2019-02-20 MED ORDER — LEVETIRACETAM 250 MG PO TABS: 750.0000 mg | ORAL_TABLET | Freq: Two times a day (BID) | ORAL | Status: DC

## 2019-02-20 MED FILL — VIMPAT 50 MG TABLET: 50 | 30 days supply | Qty: 60 | Fill #0

## 2019-02-20 NOTE — Plan of Care (Signed)
  Problem: Consults Goal: RH STROKE PATIENT EDUCATION Description: See Patient Education module for education specifics  Outcome: Progressing   Problem: RH BLADDER ELIMINATION Goal: RH STG MANAGE BLADDER WITH ASSISTANCE Description: STG Manage Bladder With min Assistance Outcome: Progressing   Problem: RH SAFETY Goal: RH STG ADHERE TO SAFETY PRECAUTIONS W/ASSISTANCE/DEVICE Description: STG Adhere to Safety Precautions With supervision cues/reminders. Outcome: Progressing   Problem: RH KNOWLEDGE DEFICIT Goal: RH STG INCREASE KNOWLEDGE OF STROKE PROPHYLAXIS Description: Pt will be able to direct care at discharge with cues/reminders of dtr regarding secondary stroke prophylaxis and prevention of secondary stroke using handouts and educational materials. Outcome: Progressing

## 2019-02-20 NOTE — Progress Notes (Signed)
[facility] PHYSICAL MEDICINE & REHABILITATION PROGRESS NOTE   Subjective/Complaints: No new complaints. Had a good night. Anxious to be home  ROS: Patient denies fever, rash, sore throat, blurred vision, nausea, vomiting, diarrhea, cough, shortness of breath or chest pain, joint or back pain, headache, or mood change.    Objective:   No results found. Recent Labs    02/20/19 0726  WBC 6.4  HGB 13.5  HCT 40.2  PLT 220   No results for input(s): NA, K, CL, CO2, GLUCOSE, BUN, CREATININE, CALCIUM in the last 72 hours.  Intake/Output Summary (Last 24 hours) at 02/20/2019 1022 Last data filed at 02/20/2019 0853 Gross per 24 hour  Intake 598 ml  Output -  Net 598 ml     Physical Exam: Vital Signs Blood pressure 109/69, pulse 62, temperature 98.3 F (36.8 C), temperature source Oral, resp. rate 18, height 6\' 4"  (1.93 m), weight 96.2 kg, SpO2 100 %. Constitutional: No distress . Vital signs reviewed. HEENT: EOMI, oral membranes moist Neck: supple Cardiovascular: RRR without murmur. No JVD    Respiratory: CTA Bilaterally without wheezes or rales. Normal effort    GI: BS +, non-tender, non-distended  Musc: No edema or tenderness in extremities. Neurological: Alert.  Slow processing but generally appropriate Motor: Grossly 4/5 no change, some motor planning issues Psych: Normal mood.  Normal affect.  Assessment/Plan: 1. Functional deficits secondary to metabolic encephalopathy which require 3+ hours per day of interdisciplinary therapy in a comprehensive inpatient rehab setting.  Physiatrist is providing close team supervision and 24 hour management of active medical problems listed below.  Physiatrist and rehab team continue to assess barriers to discharge/monitor patient progress toward functional and medical goals  Care Tool:  Bathing    Body parts bathed by patient: Right arm, Left arm, Chest, Abdomen, Front perineal area, Buttocks, Right upper leg, Left upper leg, Right  lower leg, Left lower leg, Face   Body parts bathed by helper: Buttocks     Bathing assist Assist Level: Supervision/Verbal cueing     Upper Body Dressing/Undressing Upper body dressing   What is the patient wearing?: Pull over shirt    Upper body assist Assist Level: Set up assist    Lower Body Dressing/Undressing Lower body dressing      What is the patient wearing?: Pants, Underwear/pull up     Lower body assist Assist for lower body dressing: Contact Guard/Touching assist     Toileting Toileting    Toileting assist Assist for toileting: Contact Guard/Touching assist     Transfers Chair/bed transfer  Transfers assist     Chair/bed transfer assist level: Contact Guard/Touching assist Chair/bed transfer assistive device: Programmer, multimedia   Ambulation assist      Assist level: Contact Guard/Touching assist Assistive device: Walker-rolling Max distance: 100   Walk 10 feet activity   Assist     Assist level: Contact Guard/Touching assist Assistive device: Walker-rolling   Walk 50 feet activity   Assist Walk 50 feet with 2 turns activity did not occur: Safety/medical concerns  Assist level: Contact Guard/Touching assist Assistive device: Walker-rolling    Walk 150 feet activity   Assist Walk 150 feet activity did not occur: Safety/medical concerns  Assist level: Contact Guard/Touching assist Assistive device: Walker-rolling    Walk 10 feet on uneven surface  activity   Assist Walk 10 feet on uneven surfaces activity did not occur: Safety/medical concerns         Wheelchair  Assist   Type of Wheelchair: Manual    Wheelchair assist level: Supervision/Verbal cueing Max wheelchair distance: 75 ft    Wheelchair 50 feet with 2 turns activity    Assist        Assist Level: Supervision/Verbal cueing(BLE)   Wheelchair 150 feet activity     Assist Wheelchair 150 feet activity did not occur:  Safety/medical concerns         Medical Problem List and Plan: 1.Right side weakness with aphasiasecondary to acute metabolic encephalopathy with history of glioblastoma with resection.  Continue CIR  Progressing toward goals--ELOS 8/8  -pt close to baseline. Will not have him follow up with [place]  -can see Neuro, PCP after discharge  -[facility] services 2. Antithrombotics: -DVT/anticoagulation:Continue Eliquis -antiplatelet therapy: N/A 3. Pain Management:Tylenol as needed  Controlled on 8/2 without medication 4. Mood:Provide emotional support -antipsychotic agents: N/A 5. Neuropsych: This patientis not fully capable of making decisions on hisown behalf.  6. Skin/Wound Care:continue local care as needed   BMP within acceptable range on 7/24 7. Fluids/Electrolytes/Nutrition:encourage PO 8. Seizure disorder. Keppra increased to 1500 mg twice daily and the addition of Vimpat 50 mg twice daily. EEG negative for seizure. Patient to follow-up Dr. Mickeal Skinner as outpatient  -seizure free to date in rehab  -continue sz precautions    LOS: 14 days A FACE TO FACE EVALUATION WAS PERFORMED  Meredith Staggers 02/20/2019, 10:22 AM

## 2019-02-20 NOTE — Progress Notes (Signed)
Social Work Patient ID: Christopher Morris, male   DOB: 12/10/1940, 78 y.o.   MRN: [id number]   CSW met with pt and spoke with pt's dtr via telephone to update them on team conference discussion and targeted d/c date of 02-21-19.  Dtr is coming for family education already and grandson will come 02-20-19.  Pt will need f/u therapies and dtr wants outpt.  CSW set this up for pt and ordered recommended DME.  CSW will remain available to assist as needed.  

## 2019-02-20 NOTE — Progress Notes (Signed)
Social Work Discharge Note   The overall goal for the admission was met for:   Discharge location: Yes - home  Length of Stay: Yes - 15 days  Discharge activity level: Yes - CGA/min A  Home/community participation: Yes  Services provided included: MD, RD, PT, OT, SLP, RN, Pharmacy and SW  Financial Services: Private Insurance: [employer]  Follow-up services arranged: Outpatient: PT/OT, DME: 20"x20" lightweight wheelchair with ELRs and basic cushion; rolling walker; 3-in-1, and tub transfer bench and Patient/Family request agency HH: [facility], DME: [employer]  Comments (or additional information):  Pt's dtr and grandson to care for pt at home and both came for family education and feel prepared to care for pt at home.  Patient/Family verbalized understanding of follow-up arrangements: Yes  Individual responsible for coordination of the follow-up plan: pt's Christopher Morris Christopher Morris  [phone]  Confirmed correct DME delivered: Christopher Morris 02/20/2019    Christopher Morris, Christopher Morris

## 2019-02-20 NOTE — Progress Notes (Signed)
Physical Therapy Session Note  Patient Details  Name: Christopher Morris MRN: [id number] Date of Birth: 1940/10/28  Today's Date: 02/20/2019 PT Individual Time: 1115-5208 PT Individual Time Calculation (min): 56 min   Short Term Goals: Week 2:  PT Short Term Goal 1 (Week 2): STG = LTG due to estimated d/c date.  Skilled Therapeutic Interventions/Progress Updates:  Pt received in recliner with daughter & grandson present for caregiver education. No c/o pain reported from pt. Educated pt & family on pt's R inattention, need for more tactile vs verbal cuing, need to ensure clear pathways & to secure/remove rugs, reviewed DME & f/u therapy with daughter requesting OPPT. Therapist provides demonstration for all mobility with daughter and grandson providing return demonstration for assisting pt with gait with RW, car transfer at SUV simulated height, & negotiating single step (6") with RW with family providing adequate ability to cue pt. Also educated caregivers on ability to use gait belt & educated them on pt's need for hands on assist at all times. Began to review shower transfer but pt and family appear anxious about this & OT arrived & she recommends TTB vs shower chair. Caregivers reports comfort with assisting pt with all mobility tasks reviewed today. Pt & caregivers left in handoff with OT.  Therapy Documentation Precautions:  Precautions Precautions: Fall Precaution Comments: R inattention/ apraxia Restrictions Weight Bearing Restrictions: No     Therapy/Group: Individual Therapy  [name] 02/20/2019, 3:47 PM

## 2019-02-20 NOTE — Progress Notes (Signed)
Occupational Therapy Discharge Summary  Patient Details  Name: Christopher Morris MRN: [id number] Date of Birth: 11-23-1940  Patient has met 9 of 9 long term goals due to improved activity tolerance, improved balance, postural control, ability to compensate for deficits, functional use of  RIGHT upper and RIGHT lower extremity, improved attention, improved awareness and improved coordination.  Patient to discharge at [facility] Assist level.  Patient's care partner is independent to provide the necessary physical and cognitive assistance at discharge.    Reasons goals not met: n/a  Recommendation:  Patient will benefit from ongoing skilled OT services in outpatient setting to continue to advance functional skills in the area of BADL and iADL.  Equipment: BSC; TTB  Reasons for discharge: treatment goals met  Patient/family agrees with progress made and goals achieved: Yes  OT Discharge Precautions/Restrictions  Precautions Precautions: Fall Precaution Comments: R inattention/ apraxia Restrictions Weight Bearing Restrictions: No General   Vital Signs   Pain   ADL ADL Eating: Modified independent Grooming: Supervision/safety Where Assessed-Grooming: Standing at sink Upper Body Bathing: Supervision/safety Lower Body Bathing: Supervision/safety Where Assessed-Lower Body Bathing: Shower Upper Body Dressing: Supervision/safety Where Assessed-Upper Body Dressing: Wheelchair Lower Body Dressing: Contact guard Where Assessed-Lower Body Dressing: Edge of bed Toileting: Contact guard Where Assessed-Toileting: Glass blower/designer: Therapist, music Method: Ambulating(RW) Nutritional therapist Method: Ambulating(RW) Vision Baseline Vision/History: No visual deficits Patient Visual Report: No change from baseline Additional Comments: visual scanning speed slow, but improved since eval Perception  Perception:  Impaired Inattention/Neglect: Does not attend to right side of body;Does not attend to right visual field Praxis Praxis: Impaired Praxis Impairment Details: Ideation;Motor planning Praxis-Other Comments: improved from eval Cognition Overall Cognitive Status: Impaired/Different from baseline Arousal/Alertness: Awake/alert Orientation Level: Oriented to person;Oriented to place;Oriented to situation Attention: Sustained Sustained Attention: Appears intact Awareness: Impaired Awareness Impairment: Intellectual impairment Problem Solving: Impaired Problem Solving Impairment: Functional basic Safety/Judgment: Impaired Comments: Verbal cuing required for seated LOB recovery and for body positioning when transferring onto surfaces Sensation Sensation Light Touch: Not tested Proprioception: Impaired by gross assessment Proprioception Impaired Details: Impaired RUE Coordination Gross Motor Movements are Fluid and Coordinated: No Fine Motor Movements are Fluid and Coordinated: No Motor  Motor Motor: Motor apraxia;Hemiplegia;Abnormal postural alignment and control Motor - Skilled Clinical Observations: bradykinesia Motor - Discharge Observations: generalized deconditioning/weakness Mobility  Bed Mobility Bed Mobility: Supine to Sit;Sit to Supine Rolling Right: Supervision/verbal cueing Rolling Left: Supervision/Verbal cueing Supine to Sit: Supervision/Verbal cueing Sit to Supine: Supervision/Verbal cueing Transfers Sit to Stand: Contact Guard/Touching assist Stand to Sit: Contact Guard/Touching assist  Trunk/Postural Assessment  Cervical Assessment Cervical Assessment: (forward head) Thoracic Assessment Thoracic Assessment: (rounded shoulders) Lumbar Assessment Lumbar Assessment: (posterior pelvic tilt) Postural Control Postural Control: Deficits on evaluation Righting Reactions: significantly delayed Protective Responses: significantly delayed Postural Limitations: tight B  hip flexors  Balance Balance Balance Assessed: Yes Static Sitting Balance Static Sitting - Level of Assistance: 5: Stand by assistance Dynamic Sitting Balance Dynamic Sitting - Level of Assistance: 5: Stand by assistance Static Standing Balance Static Standing - Balance Support: Bilateral upper extremity supported;During functional activity Static Standing - Level of Assistance: 5: Stand by assistance Dynamic Standing Balance Dynamic Standing - Balance Support: During functional activity Dynamic Standing - Level of Assistance: 4: Min assist Extremity/Trunk Assessment RUE Assessment Active Range of Motion (AROM) Comments: <45 degrees shoulder flexion, 60 degrees shoulder abduction. With active assist, pt able to reach 90 degrees in both flexion and abduction. WNL  otherwise. General Strength Comments: 3-/5 grossly. Apraxic. LUE Assessment LUE Assessment: Within Functional Limits   Tonny Branch 02/20/2019, 4:25 PM

## 2019-02-20 NOTE — Progress Notes (Signed)
Pt does not have legal guardian  ?

## 2019-02-20 NOTE — Progress Notes (Addendum)
Physical Therapy Discharge Summary  Patient Details  Name: Christopher Morris MRN: [id number] Date of Birth: 06/20/1941  Today's Date: 02/20/2019    Patient has met 6 of 8 long term goals due to improved activity tolerance, improved balance, improved postural control, increased strength and ability to compensate for deficits.  Patient to discharge at an ambulatory level CGA for gait, min assist for single step negotiation.   Patient's care partner is independent to provide the necessary physical and cognitive assistance at discharge.  Reasons goals not met: pt requires CGA for standing balance & supervision for sitting balance 2/2 balance & cognitive deficits  Recommendation:  Patient will benefit from ongoing skilled PT services in home health setting to continue to advance safe functional mobility, address ongoing impairments in R inattention, apraxia, standing balance, transfers, gait, awareness, stair negotiation, and minimize fall risk.  Equipment: RW, 20x20 w/c with ELR  Reasons for discharge: treatment goals met and discharge from hospital  Patient/family agrees with progress made and goals achieved: Yes  PT Discharge Precautions/Restrictions Precautions Precautions: Fall Precaution Comments: R inattention/ apraxia Restrictions Weight Bearing Restrictions: No  Vision/Perception  Per chart pt with no visual deficits, no change from baseline.  Perception: does not attend to R side of body or R visual field.  Impaired motor planning.  Cognition Overall Cognitive Status: No family/caregiver present to determine baseline cognitive functioning Arousal/Alertness: Awake/alert Orientation Level: Oriented to person;Oriented to place;Oriented to situation Awareness: Impaired Awareness Impairment: Intellectual impairment Problem Solving: Impaired Problem Solving Impairment: Functional basic Safety/Judgment: Impaired   Sensation Sensation Light Touch: Not tested Proprioception:  Not tested Coordination Gross Motor Movements are Fluid and Coordinated: No Fine Motor Movements are Fluid and Coordinated: No   Motor  Motor Motor: Motor apraxia;Hemiplegia;Abnormal postural alignment and control Motor - Discharge Observations: generalized deconditioning/weakness   Mobility Bed Mobility Bed Mobility: Supine to Sit;Sit to Supine(bed flat no rails) Supine to Sit: Supervision/Verbal cueing Sit to Supine: Supervision/Verbal cueing Transfers Transfers: Sit to Stand;Stand to Sit Sit to Stand: Contact Guard/Touching assist(with armrests/BUE support, max cuing for anterior weight shift, slightly elevated surface) Stand to Sit: Moderate Assistance - Patient 50-74%(poor eccentric control with lowering) Transfer (Assistive device): Rolling walker   Locomotion  Gait Ambulation: Yes Gait Assistance: Contact Guard/Touching assist Gait Distance (Feet): 170 Feet Assistive device: Rolling walker Gait Assistance Details: Tactile cues for weight shifting;Verbal cues for technique Gait Assistance Details: verbal cuing for increased gait speed Gait Gait: Yes Gait Pattern: Impaired Gait Pattern: Decreased step length - left;Decreased step length - right;Decreased stride length;Decreased dorsiflexion - left;Decreased dorsiflexion - right;Decreased hip/knee flexion - right;Poor foot clearance - right;Shuffle Gait velocity: poor heel strike BLE Stairs / Additional Locomotion Stairs: Yes Stairs Assistance: Minimal Assistance - Patient > 75% Stair Management Technique: With walker Number of Stairs: 1 Height of Stairs: 6(inches)   Trunk/Postural Assessment  Cervical Assessment Cervical Assessment: Exceptions to WFL(forward head) Thoracic Assessment Thoracic Assessment: Exceptions to WFL(kyphotic) Lumbar Assessment Lumbar Assessment: Exceptions to WFL(posterior pelvic tilt) Postural Control Postural Control: Deficits on evaluation Righting Reactions: significantly  delayed Protective Responses: significantly delayed Postural Limitations: tight B hip flexors   Balance Balance Balance Assessed: Yes Dynamic Standing Balance Dynamic Standing - Balance Support: Bilateral upper extremity supported;During functional activity Dynamic Standing - Level of Assistance: (CGA during gait with RW)   Extremity Assessment  BUE not formally assessed, WFL, inattention to RUE/RLE  BLE grossly 3+/5 as pt able to weight bear without buckling, pt appears to have tight BLE hip flexors &   knees as pt maintains flexed position in these joints during standing/gait.    M  02/20/2019, 3:51 PM   

## 2019-02-20 NOTE — Discharge Summary (Signed)
Physician Discharge Summary  Patient ID: Christopher Morris MRN: [id number] DOB/AGE: 1940/12/16 78 y.o.  Admit date: 02/06/2019 Discharge date: 02/21/2019  Discharge Diagnoses:  Active Problems:   Metabolic encephalopathy   Hypoalbuminemia due to protein-calorie malnutrition (HCC) DVT prophylaxis Seizure disorder  Discharged Condition: Stable  Significant Diagnostic Studies: Ct Angio Head W Or Wo Contrast  Result Date: 02/02/2019 CLINICAL DATA:  Acute presentation with right arm weakness and slurred speech EXAM: CT ANGIOGRAPHY HEAD AND NECK TECHNIQUE: Multidetector CT imaging of the head and neck was performed using the standard protocol during bolus administration of intravenous contrast. Multiplanar CT image reconstructions and MIPs were obtained to evaluate the vascular anatomy. Carotid stenosis measurements (when applicable) are obtained utilizing NASCET criteria, using the distal internal carotid diameter as the denominator. CONTRAST:  59mL OMNIPAQUE IOHEXOL 350 MG/ML SOLN COMPARISON:  Head CT same day.  MRI and CT studies 12/29/2018 FINDINGS: CTA NECK FINDINGS Aortic arch: Aortic atherosclerosis. No aneurysm or dissection. Branching pattern is normal without origin stenosis. Right carotid system: Common carotid artery widely patent to the bifurcation. Carotid bifurcation shows minimal atherosclerotic disease but no stenosis or irregularity. Cervical ICA is tortuous but widely patent. Left carotid system: Left common carotid artery is widely patent to the bifurcation. Carotid bifurcation is widely patent. Cervical ICA is normal. Vertebral arteries: Both vertebral artery origins are widely patent. Both vertebral arteries are normal through the cervical region to the foramen magnum. Skeleton: Advanced chronic cervical spondylosis as seen previously. Other neck: No soft tissue mass or lymphadenopathy. Upper chest: Negative Review of the MIP images confirms the above findings CTA HEAD FINDINGS Anterior  circulation: Both internal carotid arteries are patent through the skull base and siphon regions. Siphon atherosclerotic calcification but no stenosis. The anterior and middle cerebral vessels are patent without proximal stenosis, aneurysm or vascular malformation. No large or medium vessel occlusion. Posterior circulation: Both vertebral arteries are widely patent to the basilar. No basilar stenosis. Posterior circulation branch vessels are patent. Mild irregularity of both PCA vessels as shown previously. Venous sinuses: Patent and normal. Anatomic variants: None significant. Delayed phase: Not performed. Review of the MIP images confirms the above findings IMPRESSION: No acute vascular finding. No change since 1 month ago. Normal except for minimal nonstenotic atherosclerotic change at the bifurcation regions and siphons and some atherosclerotic narrowing and irregularity of the PCA branches. These results were communicated to Dr. Leonel Ramsay at 7:48 pmon 7/20/2020by text page via the [facility] messaging system. Electronically Signed   By: Nelson Chimes M.D.   On: 02/02/2019 19:50   Ct Angio Neck W Or Wo Contrast  Result Date: 02/02/2019 CLINICAL DATA:  Acute presentation with right arm weakness and slurred speech EXAM: CT ANGIOGRAPHY HEAD AND NECK TECHNIQUE: Multidetector CT imaging of the head and neck was performed using the standard protocol during bolus administration of intravenous contrast. Multiplanar CT image reconstructions and MIPs were obtained to evaluate the vascular anatomy. Carotid stenosis measurements (when applicable) are obtained utilizing NASCET criteria, using the distal internal carotid diameter as the denominator. CONTRAST:  20mL OMNIPAQUE IOHEXOL 350 MG/ML SOLN COMPARISON:  Head CT same day.  MRI and CT studies 12/29/2018 FINDINGS: CTA NECK FINDINGS Aortic arch: Aortic atherosclerosis. No aneurysm or dissection. Branching pattern is normal without origin stenosis. Right carotid system:  Common carotid artery widely patent to the bifurcation. Carotid bifurcation shows minimal atherosclerotic disease but no stenosis or irregularity. Cervical ICA is tortuous but widely patent. Left carotid system: Left common carotid artery is widely patent to  the bifurcation. Carotid bifurcation is widely patent. Cervical ICA is normal. Vertebral arteries: Both vertebral artery origins are widely patent. Both vertebral arteries are normal through the cervical region to the foramen magnum. Skeleton: Advanced chronic cervical spondylosis as seen previously. Other neck: No soft tissue mass or lymphadenopathy. Upper chest: Negative Review of the MIP images confirms the above findings CTA HEAD FINDINGS Anterior circulation: Both internal carotid arteries are patent through the skull base and siphon regions. Siphon atherosclerotic calcification but no stenosis. The anterior and middle cerebral vessels are patent without proximal stenosis, aneurysm or vascular malformation. No large or medium vessel occlusion. Posterior circulation: Both vertebral arteries are widely patent to the basilar. No basilar stenosis. Posterior circulation branch vessels are patent. Mild irregularity of both PCA vessels as shown previously. Venous sinuses: Patent and normal. Anatomic variants: None significant. Delayed phase: Not performed. Review of the MIP images confirms the above findings IMPRESSION: No acute vascular finding. No change since 1 month ago. Normal except for minimal nonstenotic atherosclerotic change at the bifurcation regions and siphons and some atherosclerotic narrowing and irregularity of the PCA branches. These results were communicated to Dr. Leonel Ramsay at 7:48 pmon 7/20/2020by text page via the [facility] messaging system. Electronically Signed   By: Nelson Chimes M.D.   On: 02/02/2019 19:50   Mr Brain Wo Contrast  Result Date: 02/03/2019 EXAM: MRI HEAD WITHOUT CONTRAST TECHNIQUE: Multiplanar, multiecho pulse sequences of  the brain and surrounding structures were obtained without intravenous contrast. COMPARISON:  None. FINDINGS: Brain: There is no acute ischemia. There is a large area of hyperintense T2-weighted signal centered within the left frontal lobe, unchanged from the prior study. This is superimposed on diffuse periventricular white matter hyperintensity. No contrast agent was administered on the current examination, limiting comparison to the prior study. There are areas of chronic hemorrhage at the left frontal lobe resection site. There is no midline shift or other mass effect. No acute intracranial hemorrhage. Vascular: The major intracranial arterial and venous sinus flow voids are preserved. Skull and upper cervical spine: Remote left pterional craniotomy. Sinuses/Orbits: No fluid levels or advanced mucosal thickening of the visualized paranasal sinuses. No mastoid or middle ear effusion. The orbits are normal. Other: None IMPRESSION: 1. Unchanged appearance of large amount of hyperintense T2-weighted signal centered in the left frontal lobe, which may indicate a combination of edema and/or nonenhancing tumor. 2. Comparison otherwise limited by lack of intravenous contrast material on the current study. 3. No acute ischemia. Electronically Signed   By: Ulyses Jarred M.D.   On: 02/03/2019 03:01   Ct Head Code Stroke Wo Contrast  Result Date: 02/02/2019 CLINICAL DATA:  Code stroke. Symptoms of right arm weakness and slurred speech. Glioblastoma. EXAM: CT HEAD WITHOUT CONTRAST TECHNIQUE: Contiguous axial images were obtained from the base of the skull through the vertex without intravenous contrast. COMPARISON:  MRI 12/29/2018.  CT 12/29/2018 FINDINGS: Brain: Previous left frontal craniotomy. 3 cm left frontal mass consistent with glioblastoma difficult to appreciate by noncontrast CT. Extensive vasogenic edema throughout the left hemisphere as seen previously. White matter low density consistent with previous  radiation change as seen previously. No sign of acute infarction, hemorrhage, hydrocephalus or extra-axial collection. No midline shift. Vascular: No abnormal vascular finding. Skull: Left frontal craniotomy changes. Sinuses/Orbits: Negative Other: None ASPECTS ([place] Stroke Program Early CT Score) - Ganglionic level infarction (caudate, lentiform nuclei, internal capsule, insula, M1-M3 cortex): 7 - Supraganglionic infarction (M4-M6 cortex): 3 Total score (0-10 with 10 being normal): 10  IMPRESSION: 1. No sign of ischemic disease in this patient with a known left frontal glioblastoma, extensive left frontal vasogenic edema and widespread white matter changes subsequent radiation. No change since the previous studies of last month. 2. ASPECTS is 10. 3. These results were communicated to Dr. Leonel Ramsay at 7:34 pmon 7/20/2020by text page via the [facility] messaging system. Electronically Signed   By: Nelson Chimes M.D.   On: 02/02/2019 19:36    Labs:  Basic Metabolic Panel: Recent Labs  Lab 02/16/19 1226  NA 136  K 4.3  CL 99  CO2 26  GLUCOSE 121*  BUN 18  CREATININE 0.88  CALCIUM 9.5    CBC: Recent Labs  Lab 02/20/19 0726  WBC 6.4  HGB 13.5  HCT 40.2  MCV 89.3  PLT 220    CBG: No results for input(s): GLUCAP in the last 168 hours.  Family history.  Daughter with hypertension.  Mother with CVA.  Negative for colon cancer or diabetes  Brief HPI: Gracen Southwell is a 78 year old right-handed male with history of glioblastoma status post surgical resection radiation and chemotherapy 2019 per Dr. Sherwood Gambler, seizure disorder maintained on Keppra followed by Dr. Mickeal Skinner, pulmonary emboli maintained on Eliquis.  Per chart review lives with daughter who works nights.  Used a rolling walker for functional mobility prior to admission.  Presented 02/02/2019 with right-sided weakness and aphasia.  It was noted patient with recent admission 12/29/2018 discharge 12/30/2018 following a seizure and Keppra was  adjusted at that time.  COVID negative, chemistries unremarkable.  Cranial CT scan showed no signs of ischemic disease no change since previous studies of 12/29/2018.  CT angiogram of head and neck with no vascular findings.  Patient with follow-up MRI again unchanged.  EEG showed cortical dysfunction in left hemisphere maximal left frontocentral region consistent with history of craniotomy no seizure noted.  Neurology follow-up Keppra again adjusted with the addition of Vimpat.  Tolerating a regular diet.  Patient was admitted for a comprehensive rehab program.  Hospital Course: Gerritt Galentine was admitted to rehab 02/06/2019 for inpatient therapies to consist of PT, ST and OT at least three hours five days a week. Past admission physiatrist, therapy team and rehab RN have worked together to provide customized collaborative inpatient rehab.  Pertaining to patient right side weakness aphasia secondary to acute metabolic encephalopathy with history of glioblastoma and resection.  He would follow-up outpatient neurosurgery as well as Dr. Mickeal Skinner.  He continued on Eliquis for history of DVT.  Pain management use of Tylenol.  Seizure disorder Keppra 1500 mg twice daily with the addition of Vimpat 50 mg twice daily EEG negative for seizure patient would follow-up with Dr. Mickeal Skinner as outpatient.  Physical exam.  Blood pressure 123/75 pulse 63 temperature 97.8 respirations 18 oxygen saturation 99% room air Constitutional well-developed well-nourished HEENT Head.  Normocephalic and atraumatic Eyes pupils round and reactive to light without discharge no nystagmus Neck supple nontender normal range of motion no thyromegaly Cardiovascular normal rate and rhythm no murmur heard Respiratory effort normal no respiratory distress without wheezes GI soft no distention nontender no abdominal tenderness Neurological alert and oriented patient with some processing delay and word finding deficits with apraxia right upper  extremity 4 out of 5.  Left upper extremity 4+ out of 5.  Right lower extremity 3- to 4 out of 5 proximal to distal.  Left lower extremity 3+ to 4- out of 5.  Rehab course: During patient's stay in rehab weekly team conferences were  held to monitor patient's progress, set goals and discuss barriers to discharge. At admission, patient required minimum to moderate assist 90 feet rolling walker, moderate assist sit to stand, moderate assist stand pivot transfers.  Moderate assist upper body dressing max assist lower body dressing moderate assist grooming in the standing position  He/  has had improvement in activity tolerance, balance, postural control as well as ability to compensate for deficits. He/ has had improvement in functional use RUE/LUE  and RLE/LLE as well as improvement in awareness.  Sessions focused on neuromuscular reeducation.  Ambulating to the bathroom with rolling walker contact-guard assist.  Toilet transfers with rolling walker contact-guard.  Patient managed closed with supervision for balance.  Patient stepped up with left foot step down the left foot when managing a 4 inch step.  Demonstrates shower transfers contact-guard assist.  Needed very little assist for lower body dressing.  Full family teaching completed plan discharge to home       Disposition: Discharge to home   Diet: Regular  Special Instructions: No driving smoking or alcohol  Medications at discharge. 1 Tylenol as needed 2 Eliquis 5 mg twice daily 3 Vimpat 50 mg twice daily 4 Keppra 1500 mg twice daily 5 multivitamin daily 6 vitamin B12 500 mcg daily    Follow-up Information    Meredith Staggers, MD Follow up.   Specialty: Physical Medicine and Rehabilitation Why: As directed Contact information: [address] [place] [address] [phone]        Ventura Sellers, MD Follow up.   Specialties: Psychiatry, Neurology, Oncology Why: Call for appointment Contact  information: [place] [address] [phone]        Administration, [employer] Go to.   Why: [facility] primary doctor as needed. Contact information: [place] [address] [phone]           Signed: Lavon Paganini Traill 02/20/2019, 12:16 PM Physician Discharge Summary  Patient ID: Christopher Morris MRN: [id number] DOB/AGE: May 09, 1941 78 y.o.  Admit date: 02/06/2019 Discharge date: 02/20/2019  Discharge Diagnoses:  Active Problems:   Metabolic encephalopathy   Hypoalbuminemia due to protein-calorie malnutrition [facility])   Discharged Condition: Stable  Significant Diagnostic Studies: Ct Angio Head W Or Wo Contrast  Result Date: 02/02/2019 CLINICAL DATA:  Acute presentation with right arm weakness and slurred speech EXAM: CT ANGIOGRAPHY HEAD AND NECK TECHNIQUE: Multidetector CT imaging of the head and neck was performed using the standard protocol during bolus administration of intravenous contrast. Multiplanar CT image reconstructions and MIPs were obtained to evaluate the vascular anatomy. Carotid stenosis measurements (when applicable) are obtained utilizing NASCET criteria, using the distal internal carotid diameter as the denominator. CONTRAST:  58mL OMNIPAQUE IOHEXOL 350 MG/ML SOLN COMPARISON:  Head CT same day.  MRI and CT studies 12/29/2018 FINDINGS: CTA NECK FINDINGS Aortic arch: Aortic atherosclerosis. No aneurysm or dissection. Branching pattern is normal without origin stenosis. Right carotid system: Common carotid artery widely patent to the bifurcation. Carotid bifurcation shows minimal atherosclerotic disease but no stenosis or irregularity. Cervical ICA is tortuous but widely patent. Left carotid system: Left common carotid artery is widely patent to the bifurcation. Carotid bifurcation is widely patent. Cervical ICA is normal. Vertebral arteries: Both vertebral artery origins are widely patent. Both vertebral arteries are normal through the  cervical region to the foramen magnum. Skeleton: Advanced chronic cervical spondylosis as seen previously. Other neck: No soft tissue mass or lymphadenopathy. Upper chest: Negative Review of the MIP  images confirms the above findings CTA HEAD FINDINGS Anterior circulation: Both internal carotid arteries are patent through the skull base and siphon regions. Siphon atherosclerotic calcification but no stenosis. The anterior and middle cerebral vessels are patent without proximal stenosis, aneurysm or vascular malformation. No large or medium vessel occlusion. Posterior circulation: Both vertebral arteries are widely patent to the basilar. No basilar stenosis. Posterior circulation branch vessels are patent. Mild irregularity of both PCA vessels as shown previously. Venous sinuses: Patent and normal. Anatomic variants: None significant. Delayed phase: Not performed. Review of the MIP images confirms the above findings IMPRESSION: No acute vascular finding. No change since 1 month ago. Normal except for minimal nonstenotic atherosclerotic change at the bifurcation regions and siphons and some atherosclerotic narrowing and irregularity of the PCA branches. These results were communicated to Dr. Leonel Ramsay at 7:48 pmon 7/20/2020by text page via the [facility] messaging system. Electronically Signed   By: Nelson Chimes M.D.   On: 02/02/2019 19:50   Ct Angio Neck W Or Wo Contrast  Result Date: 02/02/2019 CLINICAL DATA:  Acute presentation with right arm weakness and slurred speech EXAM: CT ANGIOGRAPHY HEAD AND NECK TECHNIQUE: Multidetector CT imaging of the head and neck was performed using the standard protocol during bolus administration of intravenous contrast. Multiplanar CT image reconstructions and MIPs were obtained to evaluate the vascular anatomy. Carotid stenosis measurements (when applicable) are obtained utilizing NASCET criteria, using the distal internal carotid diameter as the denominator. CONTRAST:  17mL  OMNIPAQUE IOHEXOL 350 MG/ML SOLN COMPARISON:  Head CT same day.  MRI and CT studies 12/29/2018 FINDINGS: CTA NECK FINDINGS Aortic arch: Aortic atherosclerosis. No aneurysm or dissection. Branching pattern is normal without origin stenosis. Right carotid system: Common carotid artery widely patent to the bifurcation. Carotid bifurcation shows minimal atherosclerotic disease but no stenosis or irregularity. Cervical ICA is tortuous but widely patent. Left carotid system: Left common carotid artery is widely patent to the bifurcation. Carotid bifurcation is widely patent. Cervical ICA is normal. Vertebral arteries: Both vertebral artery origins are widely patent. Both vertebral arteries are normal through the cervical region to the foramen magnum. Skeleton: Advanced chronic cervical spondylosis as seen previously. Other neck: No soft tissue mass or lymphadenopathy. Upper chest: Negative Review of the MIP images confirms the above findings CTA HEAD FINDINGS Anterior circulation: Both internal carotid arteries are patent through the skull base and siphon regions. Siphon atherosclerotic calcification but no stenosis. The anterior and middle cerebral vessels are patent without proximal stenosis, aneurysm or vascular malformation. No large or medium vessel occlusion. Posterior circulation: Both vertebral arteries are widely patent to the basilar. No basilar stenosis. Posterior circulation branch vessels are patent. Mild irregularity of both PCA vessels as shown previously. Venous sinuses: Patent and normal. Anatomic variants: None significant. Delayed phase: Not performed. Review of the MIP images confirms the above findings IMPRESSION: No acute vascular finding. No change since 1 month ago. Normal except for minimal nonstenotic atherosclerotic change at the bifurcation regions and siphons and some atherosclerotic narrowing and irregularity of the PCA branches. These results were communicated to Dr. Leonel Ramsay at 7:48 pmon  7/20/2020by text page via the [facility] messaging system. Electronically Signed   By: Nelson Chimes M.D.   On: 02/02/2019 19:50   Mr Brain Wo Contrast  Result Date: 02/03/2019 EXAM: MRI HEAD WITHOUT CONTRAST TECHNIQUE: Multiplanar, multiecho pulse sequences of the brain and surrounding structures were obtained without intravenous contrast. COMPARISON:  None. FINDINGS: Brain: There is no acute ischemia. There is a large  area of hyperintense T2-weighted signal centered within the left frontal lobe, unchanged from the prior study. This is superimposed on diffuse periventricular white matter hyperintensity. No contrast agent was administered on the current examination, limiting comparison to the prior study. There are areas of chronic hemorrhage at the left frontal lobe resection site. There is no midline shift or other mass effect. No acute intracranial hemorrhage. Vascular: The major intracranial arterial and venous sinus flow voids are preserved. Skull and upper cervical spine: Remote left pterional craniotomy. Sinuses/Orbits: No fluid levels or advanced mucosal thickening of the visualized paranasal sinuses. No mastoid or middle ear effusion. The orbits are normal. Other: None IMPRESSION: 1. Unchanged appearance of large amount of hyperintense T2-weighted signal centered in the left frontal lobe, which may indicate a combination of edema and/or nonenhancing tumor. 2. Comparison otherwise limited by lack of intravenous contrast material on the current study. 3. No acute ischemia. Electronically Signed   By: Ulyses Jarred M.D.   On: 02/03/2019 03:01   Ct Head Code Stroke Wo Contrast  Result Date: 02/02/2019 CLINICAL DATA:  Code stroke. Symptoms of right arm weakness and slurred speech. Glioblastoma. EXAM: CT HEAD WITHOUT CONTRAST TECHNIQUE: Contiguous axial images were obtained from the base of the skull through the vertex without intravenous contrast. COMPARISON:  MRI 12/29/2018.  CT 12/29/2018 FINDINGS: Brain:  Previous left frontal craniotomy. 3 cm left frontal mass consistent with glioblastoma difficult to appreciate by noncontrast CT. Extensive vasogenic edema throughout the left hemisphere as seen previously. White matter low density consistent with previous radiation change as seen previously. No sign of acute infarction, hemorrhage, hydrocephalus or extra-axial collection. No midline shift. Vascular: No abnormal vascular finding. Skull: Left frontal craniotomy changes. Sinuses/Orbits: Negative Other: None ASPECTS ([place] Stroke Program Early CT Score) - Ganglionic level infarction (caudate, lentiform nuclei, internal capsule, insula, M1-M3 cortex): 7 - Supraganglionic infarction (M4-M6 cortex): 3 Total score (0-10 with 10 being normal): 10 IMPRESSION: 1. No sign of ischemic disease in this patient with a known left frontal glioblastoma, extensive left frontal vasogenic edema and widespread white matter changes subsequent radiation. No change since the previous studies of last month. 2. ASPECTS is 10. 3. These results were communicated to Dr. Leonel Ramsay at 7:34 pmon 7/20/2020by text page via the [facility] messaging system. Electronically Signed   By: Nelson Chimes M.D.   On: 02/02/2019 19:36    Labs:  Basic Metabolic Panel: Recent Labs  Lab 02/16/19 1226  NA 136  K 4.3  CL 99  CO2 26  GLUCOSE 121*  BUN 18  CREATININE 0.88  CALCIUM 9.5    CBC: Recent Labs  Lab 02/20/19 0726  WBC 6.4  HGB 13.5  HCT 40.2  MCV 89.3  PLT 220    CBG: No results for input(s): GLUCAP in the last 168 hours.  Brief HPI:     Hospital Course: Djuan Talton was admitted to rehab 02/06/2019 for inpatient therapies to consist of PT, ST and OT at least three hours five days a week. Past admission physiatrist, therapy team and rehab RN have worked together to provide customized collaborative inpatient rehab.   Rehab course: During patient's stay in rehab weekly team conferences were held to monitor patient's progress,  set goals and discuss barriers to discharge. At admission, patient required  He/She  has had improvement in activity tolerance, balance, postural control as well as ability to compensate for deficits. He/She has had improvement in functional use RUE/LUE  and RLE/LLE as well as improvement in awareness  Disposition: Discharged home   Diet: Regular  Special Instructions: No driving smoking or alcohol  Medications at discharge 1 Eliquis 5 mg twice daily 2 Vimpat 50 mg twice daily 3 Keppra 1500 mg twice daily 4 multivitamin daily 5 vitamin B12 500 mcg daily  Follow-up Information    Meredith Staggers, MD Follow up.   Specialty: Physical Medicine and Rehabilitation Why: As directed Contact information: [address] [place] [address] [phone]        Ventura Sellers, MD Follow up.   Specialties: Psychiatry, Neurology, Oncology Why: Call for appointment Contact information: [place] [address] [phone]        Administration, [employer] Go to.   Why: [facility] primary doctor as needed. Contact information: [place] [address] [phone]           Signed: Lavon Paganini [place] 02/20/2019, 12:16 PM

## 2019-02-20 NOTE — Progress Notes (Signed)
Occupational Therapy Session Note  Patient Details  Name: Christopher Morris MRN: [id number] Date of Birth: [birth year]  Today's Date: 02/20/2019 OT Individual Time: 7897-8478 OT Individual Time Calculation (min): 75 min    Short Term Goals: Week 1:  OT Short Term Goal 1 (Week 1): Pt will complete shower transfers with Min A using LRAD OT Short Term Goal 1 - Progress (Week 1): Met OT Short Term Goal 2 (Week 1): Pt will complete 2 grooming tasks while standing at the sink with supervision for balance OT Short Term Goal 2 - Progress (Week 1): Progressing toward goal OT Short Term Goal 3 (Week 1): Pt will complete toilet transfers with Min A using LRAD OT Short Term Goal 3 - Progress (Week 1): Met  Skilled Therapeutic Interventions/Progress Updates:    1;1. Pt received in bed, "playing possum" asleep and laughing when attempting to arouse pt. Pt with no pain reported. Pt ambulates with CGA to transfer into and out of bathroom with RW with VC for large steps with RLE and keeping RLE inside RW. Pt bathes sit to stand level using grab bar with supervision. Pt able to sequence and terminate bathing body parts appropriately. Pt completes grooming seated this date with set up. Pt dresses locating all items on R and dons shirt/jacket with set up and pants with S at sit to stand at sink. OT dons teds only. Pt completes 9HPT: 2 min 11 seconds with RUE and bos and blocks RUE 9 blocks. Exited session with pt seated in w/c, call light in reach and all needs met  Session 2: [id number] 55 min  Pt with no pain received from direct handoff from PT after attempting shower transfer with grandson alex. Educated on option of TTB and family verbalized feeling more comfortable with this option. Pt and grandson change pants. Pt completes standing with grandson providing cuing and tactile input at hips for blanace while Pt stands to advance pants up/down hips. Pt able to doff pants over feet and OT needs to cue family to not  assist pt with doffing/donning pants as they are eager to help. Educated family on hemi dressing techniques for UB and LB dressing and pt able demo without cues. Pt completes toileting with grandson with OT only cuing gransdon to be louder. Grandson adequately cues for hand reach back, RW managemetn and keeping feet inside walker using multimodal methods. Pt completes [facility] task of manipulating buttons, fastening zippers and tieing bows for bimanual tasks and   Therapy Documentation Precautions:  Precautions Precautions: Fall Precaution Comments: R inattention/ apraxia Restrictions Weight Bearing Restrictions: No General:   Vital Signs:   Pain: Pain Assessment Pain Scale: Faces Faces Pain Scale: No hurt ADL: ADL Eating: Not assessed Grooming: Contact guard Where Assessed-Grooming: Standing at sink Upper Body Bathing: Supervision/safety Lower Body Bathing: Contact guard Where Assessed-Lower Body Bathing: Shower Upper Body Dressing: Supervision/safety Where Assessed-Upper Body Dressing: Wheelchair Lower Body Dressing: Moderate assistance Where Assessed-Lower Body Dressing: Edge of bed Toileting: Minimal assistance Where Assessed-Toileting: Glass blower/designer: Moderate assistance Toilet Transfer Method: Ambulating(RW) Social research officer, government: Moderate assistance Social research officer, government Method: Ambulating(RW) Vision   Perception    Praxis   Exercises:   Other Treatments:     Therapy/Group: Individual Therapy  Tonny Branch 02/20/2019, 10:09 AM

## 2019-02-21 NOTE — Progress Notes (Signed)
[facility] PHYSICAL MEDICINE & REHABILITATION PROGRESS NOTE   Subjective/Complaints: No problems. Happy about discharge home  ROS: Patient denies fever, rash, sore throat, blurred vision, nausea, vomiting, diarrhea, cough, shortness of breath or chest pain, joint or back pain, headache, or mood change.   Objective:   No results found. Recent Labs    02/20/19 0726  WBC 6.4  HGB 13.5  HCT 40.2  PLT 220   No results for input(s): NA, K, CL, CO2, GLUCOSE, BUN, CREATININE, CALCIUM in the last 72 hours.  Intake/Output Summary (Last 24 hours) at 02/21/2019 0950 Last data filed at 02/20/2019 2300 Gross per 24 hour  Intake 702 ml  Output -  Net 702 ml     Physical Exam: Vital Signs Blood pressure 122/70, pulse 99, temperature 98.7 F (37.1 C), resp. rate 18, height 6\' 4"  (1.93 m), weight 96.2 kg, SpO2 91 %. Constitutional: No distress . Vital signs reviewed. HEENT: EOMI, oral membranes moist Neck: supple Cardiovascular: RRR without murmur. No JVD    Respiratory: CTA Bilaterally without wheezes or rales. Normal effort    GI: BS +, non-tender, non-distended  Musc: No edema or tenderness in extremities. Neurological: Alert.  Slow processing but generally appropriate Motor: Grossly 4/5 no change, some motor planning issues Psych: Normal mood.  Normal affect.  Assessment/Plan: 1. Functional deficits secondary to metabolic encephalopathy which require 3+ hours per day of interdisciplinary therapy in a comprehensive inpatient rehab setting.  Physiatrist is providing close team supervision and 24 hour management of active medical problems listed below.  Physiatrist and rehab team continue to assess barriers to discharge/monitor patient progress toward functional and medical goals  Care Tool:  Bathing    Body parts bathed by patient: Right arm, Left arm, Chest, Abdomen, Front perineal area, Buttocks, Right upper leg, Left upper leg, Right lower leg, Left lower leg, Face   Body  parts bathed by helper: Buttocks     Bathing assist Assist Level: Supervision/Verbal cueing     Upper Body Dressing/Undressing Upper body dressing   What is the patient wearing?: Pull over shirt    Upper body assist Assist Level: Set up assist    Lower Body Dressing/Undressing Lower body dressing      What is the patient wearing?: Pants, Underwear/pull up     Lower body assist Assist for lower body dressing: Contact Guard/Touching assist     Toileting Toileting    Toileting assist Assist for toileting: Contact Guard/Touching assist     Transfers Chair/bed transfer  Transfers assist     Chair/bed transfer assist level: Contact Guard/Touching assist Chair/bed transfer assistive device: Programmer, multimedia   Ambulation assist      Assist level: Contact Guard/Touching assist Assistive device: Walker-rolling Max distance: 150 ft   Walk 10 feet activity   Assist     Assist level: Contact Guard/Touching assist Assistive device: Walker-rolling   Walk 50 feet activity   Assist Walk 50 feet with 2 turns activity did not occur: Safety/medical concerns  Assist level: Contact Guard/Touching assist Assistive device: Walker-rolling    Walk 150 feet activity   Assist Walk 150 feet activity did not occur: Safety/medical concerns  Assist level: Contact Guard/Touching assist Assistive device: Walker-rolling    Walk 10 feet on uneven surface  activity   Assist Walk 10 feet on uneven surfaces activity did not occur: Safety/medical concerns         Wheelchair     Assist Will patient use wheelchair at discharge?: No  Type of Wheelchair: Manual    Wheelchair assist level: Supervision/Verbal cueing Max wheelchair distance: 36 ft    Wheelchair 50 feet with 2 turns activity    Assist        Assist Level: Supervision/Verbal cueing(BLE)   Wheelchair 150 feet activity     Assist Wheelchair 150 feet activity did not occur:  Safety/medical concerns         Medical Problem List and Plan: 1.Right side weakness with aphasiasecondary to acute metabolic encephalopathy with history of glioblastoma with resection.   Dc home today  -pt close to baseline. Will not have him follow up with [place]  -can see Neuro, PCP after discharge  -[facility] services 2. Antithrombotics: -DVT/anticoagulation:Continue Eliquis -antiplatelet therapy: N/A 3. Pain Management:Tylenol as needed  Controlled on 8/2 without medication 4. Mood:Provide emotional support -antipsychotic agents: N/A 5. Neuropsych: This patientis not fully capable of making decisions on hisown behalf.  6. Skin/Wound Care:continue local care as needed   BMP within acceptable range on 7/24 7. Fluids/Electrolytes/Nutrition:encourage PO 8. Seizure disorder. Keppra increased to 1500 mg twice daily and the addition of Vimpat 50 mg twice daily. EEG negative for seizure. Patient to follow-up Dr. Mickeal Skinner as outpatient  -seizure free to date in rehab  -continue sz precautions    LOS: 15 days A FACE TO FACE EVALUATION WAS PERFORMED  Meredith Staggers 02/21/2019, 9:50 AM

## 2019-02-21 NOTE — Plan of Care (Signed)
  Problem: Consults Goal: RH STROKE PATIENT EDUCATION Description: See Patient Education module for education specifics  Outcome: Completed/Met   Problem: RH BLADDER ELIMINATION Goal: RH STG MANAGE BLADDER WITH ASSISTANCE Description: STG Manage Bladder With min Assistance Outcome: Completed/Met   Problem: RH SAFETY Goal: RH STG ADHERE TO SAFETY PRECAUTIONS W/ASSISTANCE/DEVICE Description: STG Adhere to Safety Precautions With supervision cues/reminders. Outcome: Completed/Met   Problem: RH KNOWLEDGE DEFICIT Goal: RH STG INCREASE KNOWLEDGE OF STROKE PROPHYLAXIS Description: Pt will be able to direct care at discharge with cues/reminders of dtr regarding secondary stroke prophylaxis and prevention of secondary stroke using handouts and educational materials. Outcome: Completed/Met  Pt to DC home today, Daughter supposed to arrive approx 11AM. DC summary was completed on 8/7 by PA. Pt excited about going home. Belongings will be packed and pt will be transported to main lobby once daughter arrives. Pt denies any pain. Resting in bed with call bell in reach.   Erie Noe, RN

## 2019-02-24 ENCOUNTER — Ambulatory Visit: Payer: Medicare Other | Attending: Physical Medicine and Rehabilitation | Admitting: Physical Therapy

## 2019-02-24 ENCOUNTER — Other Ambulatory Visit: Payer: Self-pay

## 2019-02-24 ENCOUNTER — Encounter: Payer: Self-pay | Admitting: Physical Therapy

## 2019-02-24 DIAGNOSIS — R41840 Attention and concentration deficit: Secondary | ICD-10-CM | POA: Insufficient documentation

## 2019-02-24 DIAGNOSIS — R2681 Unsteadiness on feet: Secondary | ICD-10-CM | POA: Diagnosis present

## 2019-02-24 DIAGNOSIS — R29818 Other symptoms and signs involving the nervous system: Secondary | ICD-10-CM | POA: Diagnosis present

## 2019-02-24 DIAGNOSIS — R2689 Other abnormalities of gait and mobility: Secondary | ICD-10-CM | POA: Insufficient documentation

## 2019-02-24 DIAGNOSIS — R293 Abnormal posture: Secondary | ICD-10-CM | POA: Insufficient documentation

## 2019-02-24 DIAGNOSIS — M6281 Muscle weakness (generalized): Secondary | ICD-10-CM | POA: Diagnosis present

## 2019-02-24 DIAGNOSIS — I69951 Hemiplegia and hemiparesis following unspecified cerebrovascular disease affecting right dominant side: Secondary | ICD-10-CM | POA: Diagnosis present

## 2019-02-24 DIAGNOSIS — M6249 Contracture of muscle, multiple sites: Secondary | ICD-10-CM | POA: Diagnosis present

## 2019-02-24 DIAGNOSIS — R208 Other disturbances of skin sensation: Secondary | ICD-10-CM | POA: Insufficient documentation

## 2019-02-24 DIAGNOSIS — R41842 Visuospatial deficit: Secondary | ICD-10-CM | POA: Insufficient documentation

## 2019-02-24 DIAGNOSIS — R278 Other lack of coordination: Secondary | ICD-10-CM | POA: Insufficient documentation

## 2019-02-25 NOTE — Therapy (Signed)
[facility] [address] [place], [place], [address] Phone: [phone]   Fax:  [phone]  Physical Therapy Evaluation  Patient Details  Name: Christopher Morris MRN: [id number] Date of Birth: 1941/07/02 Referring Provider (PT): Alger Simons, MD (referral from Reesa Chew, [place] but more hospitalist)   Encounter Date: 02/24/2019   CLINIC OPERATION CHANGES: Outpatient Neuro Rehab is open at lower capacity following universal masking, social distancing, and patient screening.  The patient's COVID risk of complications score is 5.   PT End of Session - 02/25/19 0901    Visit Number  1    Number of Visits  25    Date for PT Re-Evaluation  05/25/19    Authorization Type  UHC Medicare  (VA will not cover without referral from the [place])    Authorization Time Period  Co pay waved until 9.30.20  PT copay $30,  OOP $3600  $735 applied    PT Start Time  1535    PT Stop Time  1625    PT Time Calculation (min)  50 min    Equipment Utilized During Treatment  Gait belt    Activity Tolerance  Patient tolerated treatment well    Behavior During Therapy  WFL for tasks assessed/performed;Flat affect       Past Medical History:  Diagnosis Date  . Brain cancer ([place])   . Glioblastoma ([place]) 09/25/2017  . Hyperglycemia 09/25/2017  . Seizure ([place]) 09/25/2017    Past Surgical History:  Procedure Laterality Date  . APPLICATION OF CRANIAL NAVIGATION N/A 09/30/2017   Procedure: APPLICATION OF CRANIAL NAVIGATION;  Surgeon: [name], MD;  Location: [facility];  Service: Neurosurgery;  Laterality: N/A;  . CRANIOTOMY N/A 09/30/2017   Procedure: CRANIOTOMY FOR RESECTION OF BRAIN TUMOR WITH STEREOTACTIC;  Surgeon: [name], MD;  Location: [facility];  Service: Neurosurgery;  Laterality: N/A;  CRANIOTOMY FOR RESECTION OF BRAIN TUMOR WITH STEREOTACTIC    There were no vitals filed for this visit.   Subjective Assessment - 02/24/19 1543    Subjective  This 78yo male was referred on 02/19/2019 by Reesa Chew, PA with metabolic encephalopathy & seizures. Hx of glioblastoma (s/p sg resection, radation & chemo), grand mal seizure, PE on Eliquis. He was admitted on 02/02/2019 presented with seizures & new right side weakness after seizure. Inpatient rehab 02/06/2019 - 02/21/2019.    Patient is accompained by:  Family member   dtr, Nicholes Rough   Pertinent History  March 2019 glioblastoma (s/p sg resection, radation & chemo), grand mal seizure, PE on Eliquis    Limitations  Lifting;Standing;Walking;House hold activities    Patient Stated Goals  he wants to be able walk & balance like before recent hospitalization    Currently in Pain?  No/denies         [facility] PT Assessment - 02/24/19 1530      Assessment   Medical Diagnosis  Metabolic Encephalopathy    Referring Provider (PT)  Alger Simons, MD   referral from Reesa Chew, [place] but more hospitalist   Onset Date/Surgical Date  02/02/19    Hand Dominance  Right   ambidexterous   Prior Therapy  inpatient rehab 02/06/2019 - 02/21/2019      Precautions   Precautions  Fall    Precaution Comments  assistance with all standing & gait activities, no driving      Balance Screen   Has the patient fallen in the past 6 months  Yes    How many  times?  1-2 x/month   off balance, no serious injuries   Has the patient had a decrease in activity level because of a fear of falling?   Yes    Is the patient reluctant to leave their home because of a fear of falling?   Yes      [place] residence    Living Arrangements  Children   dtr & 21yo grandson   Type of [place] to enter    Entrance Stairs-Number of Steps  1 + 1    Entrance Stairs-Rails  None    Home Layout  One level    [place] - 2 wheels;Bedside commode;Tub bench;Grab bars - tub/shower;Hand held shower head;Wheelchair - manual      Prior Function   Level of  Independence  Independent;Independent with community mobility with device;Independent with household mobility with device   in home sometimes rollator & some no AD, community rollator   Vocation  Retired    Leisure  watching tv, being outside, outdoor work      Programmer, applications Movements are Fluid and Coordinated  No    Fine Motor Movements are Fluid and Coordinated  No    Coordination and Movement Description  bradykinetic.    Finger Nose Finger Test  Dysmetria bilaterally, slower to complete with Rt compared to Lt      Posture/Postural Control   Posture/Postural Control  Postural limitations    Postural Limitations  Rounded Shoulders;Forward head;Flexed trunk      Tone   Assessment Location  Right Lower Extremity;Left Lower Extremity      ROM / Strength   AROM / PROM / Strength  PROM;Strength      PROM   Overall PROM   Deficits    Overall PROM Comments  hip flexor contractures bilaterally    PROM Assessment Site  Knee;Ankle    Right Knee Extension  -12   supine with hip extended   Right Ankle Dorsiflexion  -9   from neutral     Strength   Overall Strength  Deficits    Overall Strength Comments  1 time MMT hip flexors, knee extensors & ankle DF 5/5 but seems to fatigue & decline    Right Hip Flexion  4/5    Right Hip Extension  2+/5    Right Hip ABduction  3-/5    Left Hip Flexion  5/5    Left Hip Extension  3-/5    Left Hip ABduction  3+/5    Right/Left Knee  Right;Left    Right Knee Flexion  3+/5    Right Knee Extension  4+/5    Left Knee Flexion  4/5    Left Knee Extension  5/5    Right/Left Ankle  Right;Left    Right Ankle Dorsiflexion  4/5    Right Ankle Plantar Flexion  2+/5    Left Ankle Dorsiflexion  5/5    Left Ankle Plantar Flexion  2+/5      Flexibility   Soft Tissue Assessment /Muscle Length  yes    Hamstrings  Right -46* & Left -25*   supine with hip 90* & PT extends knee passively     Bed Mobility   Bed Mobility  Rolling Right;Rolling  Left;Left Sidelying to Sit;Sit to Supine    Rolling Right  Supervision/verbal cueing    Rolling Left  Supervision/Verbal cueing  Left Sidelying to Sit  Supervision/Verbal cueing    Sit to Supine  Supervision/Verbal cueing      Transfers   Transfers  Sit to Stand;Stand to Sit    Sit to Stand  3: Mod assist;2: Max assist;With upper extremity assist;With armrests;From chair/3-in-1;Multiple attempts   ModA to [place] varies with fatigue   Stand to Sit  4: Min assist;With upper extremity assist;With armrests;To chair/3-in-1;Uncontrolled descent    Stand to Sit Details  improved control when reaches with RUE first. When reaches with LUE first, he forgets to engage RUE      Ambulation/Gait   Ambulation/Gait  Yes    Ambulation/Gait Assistance  3: Mod assist    Ambulation/Gait Assistance Details  excessive BUEs weight bearing on RW    Ambulation Distance (Feet)  150 Feet    Assistive device  Rolling walker    Gait Pattern  Step-to pattern;Decreased step length - left;Decreased stance time - right;Decreased stride length;Decreased hip/knee flexion - right;Decreased hip/knee flexion - left;Right foot flat;Left foot flat;Right flexed knee in stance;Left flexed knee in stance;Shuffle;Lateral hip instability;Trunk flexed    Ambulation Surface  Level;Indoor    Gait velocity  0.86 ft/sec without distraction, 0.45 ft/sec naming states he has traveled (per dtr every state);  he required redirection & only named 3 states in 42' (~16 sec).        Standardized Balance Assessment   Standardized Balance Assessment  Berg Balance Test;Dynamic Gait Index      Berg Balance Test   Sit to Stand  Needs moderate or maximal assist to stand    Standing Unsupported  Needs several tries to stand 30 seconds unsupported   once PT establishes initial stabilization   Sitting with Back Unsupported but Feet Supported on Floor or Stool  Able to sit safely and securely 2 minutes    Stand to Sit  Sits independently, has  uncontrolled descent    Transfers  Needs one person to assist    Standing Unsupported with Eyes Closed  Unable to keep eyes closed 3 seconds but stays steady    Standing Unsupported with Feet Together  Needs help to attain position and unable to hold for 15 seconds    From Standing, Reach Forward with Outstretched Arm  Loses balance while trying/requires external support    From Standing Position, Pick up Object from Floor  Unable to try/needs assist to keep balance    From Standing Position, Turn to Look Behind Over each Shoulder  Needs assist to keep from losing balance and falling    Turn 360 Degrees  Needs assistance while turning    Standing Unsupported, Alternately Place Feet on Step/Stool  Needs assistance to keep from falling or unable to try    Standing Unsupported, One Foot in [employer] balance while stepping or standing    Standing on One Leg  Unable to try or needs assist to prevent fall    Total Score  8      Dynamic Gait Index   Level Surface  Severe Impairment    Change in Gait Speed  Severe Impairment    Gait with Horizontal Head Turns  Severe Impairment   stops ambulating   Gait with Vertical Head Turns  Severe Impairment   stops ambulating   Gait and Pivot Turn  Severe Impairment    Step Over Obstacle  Severe Impairment    Step Around Obstacles  Severe Impairment    Steps  Severe Impairment    Total  Score  0    DGI comment:  RW support - assessing changes with scanning & negotiating obstacles with increased LOB & need for assistance      RLE Tone   RLE Tone  Within Functional Limits      LLE Tone   LLE Tone  Within Functional Limits                Objective measurements completed on examination: See above findings.                PT Short Term Goals - 02/25/19 0931      PT SHORT TERM GOAL #1   Title  Patient & daughter verbalize & demonstrate understanding of initial HEP.  (All STGs Target Date: 03/27/2019)    Time  4    Period   Weeks    Status  New    Target Date  03/27/19      PT SHORT TERM GOAL #2   Title  Sit to/from stand from chairs with armrests to RW and stabilizes upon arising with minimal assist.    Time  4    Period  Weeks    Status  New    Target Date  03/27/19      PT SHORT TERM GOAL #3   Title  Patient able to reach 5" anteriorly & to knee with RW support with supervision.    Time  4    Period  Weeks    Status  New    Target Date  03/27/19      PT SHORT TERM GOAL #4   Title  Patient ambulates 100' around furniture carrying conversation with supervision.    Time  4    Period  Weeks    Status  New    Target Date  03/27/19        PT Long Term Goals - 02/25/19 0925      PT LONG TERM GOAL #1   Title  Patient & family verbalize & demonstrate understanding of ongoing HEP to address flexibility, strength, balance & endurance.  (All LTGs Target Date: 05/22/2019)    Time  12    Period  Weeks    Status  New    Target Date  05/22/19      PT LONG TERM GOAL #2   Title  Patient able to transfer sit/stand including stabilization to rolling walker modified independent.    Time  12    Period  Weeks    Status  New    Target Date  05/22/19      PT LONG TERM GOAL #3   Title  Patient able to perform standing balance with external support / RW reaching 5", reaches to knee level & manages clothes for toileting modified independent.    Time  12    Period  Weeks    Status  New    Target Date  05/22/19      PT LONG TERM GOAL #4   Title  Patient ambulates around furniture 100' with RW modified independent for household mobility.    Time  12    Period  Weeks    Status  New    Target Date  05/22/19      PT LONG TERM GOAL #5   Title  Patient ambulates 250' with RW outdoors on paved surfaces with supervision to enable community mobility with family.    Time  12    Period  Weeks  Status  New    Target Date  05/22/19      PT LONG TERM GOAL #6   Title  Patient negotiates ramps & curbs with RW  with minimal assist to enable community access with family.    Time  12    Period  Weeks    Status  New    Target Date  05/22/19             Plan - 02/25/19 0908    Clinical Impression Statement  This 78yo male was active & still working prior to March 2019 with diagnosis of Glioblastoma. After recovery from Glioblastoma, he was modifed independent with mobility with rollator walker & occassional no device in home. He has significant decline in function & safety since seizures & encephalopathy over last month.  Patient has impaired posture & severe abnormal flexibility impacting balance. He has weakness bilaterally with right > left of LEs and impaired coordination of LE movements.  Berg Balance 8/56 indicating high fall risk & dependency in standing ADLs. He requires assist for safe transfers sit to/from stand including initial stabilization.  Gait with rolling walker has significant deviations with gait velocity 0.86 ft/sec.  When he scans or negotiates obstacles, he has increased gait abnormalities with assistance. PT had patient perform simple cognitive task of naming states that he has traveled (per dtr he has traveled to every state) while ambulating. His gait velocity declined significantly to 0.45 ft/sec (47.7% decline) and only able to name 3 states in 16 seconds with redirection required.  This gentleman has had significant decline over last month and would benefit from skilled PT to improve mobility & safety.    Personal Factors and Comorbidities  Age;Comorbidity 3+;Fitness;Past/Current Experience    Comorbidities  Glioblastoma, seizures, PE, encephalopathy    Examination-Activity Limitations  Bathing;Bed Mobility;Hygiene/Grooming;Locomotion Level;Reach Overhead;Stairs;Stand;Toileting;Transfers    Examination-Participation Restrictions  Church;Community Activity   lives with adult dtr who works 3rd shift & 43yo grandson; was able to stay alone prior recent hospitalization & now  requires 24hr supervision / assistance   Stability/Clinical Decision Making  Evolving/Moderate complexity    Clinical Decision Making  Moderate    Rehab Potential  Good    PT Frequency  2x / week    PT Duration  12 weeks    PT Treatment/Interventions  ADLs/Self Care Home Management;DME Instruction;Gait training;Stair training;Functional mobility training;Therapeutic activities;Therapeutic exercise;Balance training;Neuromuscular re-education;Patient/family education;Passive range of motion;Vestibular    PT Next Visit Plan  initiate HEP for flexibility & strength    Consulted and Agree with Plan of Care  Patient;Family member/caregiver    Family Member Consulted  dtr, Makaio Mach       Patient will benefit from skilled therapeutic intervention in order to improve the following deficits and impairments:  Abnormal gait, Decreased activity tolerance, Decreased balance, Decreased coordination, Decreased endurance, Decreased mobility, Decreased range of motion, Decreased strength, Impaired flexibility, Postural dysfunction  Visit Diagnosis: 1. Abnormal posture   2. Other lack of coordination   3. Contracture of muscle, multiple sites   4. Muscle weakness (generalized)   5. Unsteadiness on feet   6. Other abnormalities of gait and mobility   7. Other symptoms and signs involving the nervous system        Problem List Patient Active Problem List   Diagnosis Date Noted  . Hypoalbuminemia due to protein-calorie malnutrition ([place])   . Metabolic encephalopathy 76/22/6333  . Seizures ([place]) 02/03/2019  . Right sided weakness 02/02/2019  . Acute metabolic encephalopathy  12/29/2018  . Aphasia 12/29/2018  . Pneumonia 12/22/2017  . HCAP (healthcare-associated pneumonia) 12/22/2017  . Pulmonary embolism ([place]) 12/22/2017  . Tachycardia   . Vitamin B12 deficiency 11/14/2017  . Anemia 11/14/2017  . Fever 11/13/2017  . Dehydration 11/13/2017  . Interstitial pneumonia ([place]) 11/11/2017  . Sepsis  ([place]) 11/09/2017  . Bacteremia due to Gram-negative bacteria 10/26/2017  . Sepsis due to Escherichia coli (E. coli) ([place]) 10/26/2017  . UTI (urinary tract infection) 10/25/2017  . Seizure ([place]) 09/25/2017  . Frontal glioblastoma multiforme ([place]) 09/25/2017  . Hyperglycemia 09/25/2017  . BLOOD IN STOOL 07/26/2008  . ERECTILE DYSFUNCTION 07/12/2008    Jamey Reas PT, DPT 02/25/2019, 1:54 PM  [facility] [address] [place], [place], [address] Phone: [phone]   Fax:  [phone]  Name: Kentrel Clevenger MRN: [id number] Date of Birth: Apr 28, 1941

## 2019-02-27 ENCOUNTER — Ambulatory Visit: Payer: Medicare Other | Admitting: Occupational Therapy

## 2019-03-03 ENCOUNTER — Other Ambulatory Visit: Payer: Self-pay

## 2019-03-03 ENCOUNTER — Encounter: Payer: Self-pay | Admitting: Physical Therapy

## 2019-03-03 ENCOUNTER — Ambulatory Visit: Payer: Medicare Other | Admitting: Physical Therapy

## 2019-03-03 DIAGNOSIS — R293 Abnormal posture: Secondary | ICD-10-CM | POA: Diagnosis not present

## 2019-03-03 DIAGNOSIS — M6281 Muscle weakness (generalized): Secondary | ICD-10-CM

## 2019-03-03 DIAGNOSIS — R29818 Other symptoms and signs involving the nervous system: Secondary | ICD-10-CM

## 2019-03-03 DIAGNOSIS — R2681 Unsteadiness on feet: Secondary | ICD-10-CM

## 2019-03-03 DIAGNOSIS — M6249 Contracture of muscle, multiple sites: Secondary | ICD-10-CM

## 2019-03-03 DIAGNOSIS — R2689 Other abnormalities of gait and mobility: Secondary | ICD-10-CM

## 2019-03-03 NOTE — Patient Instructions (Signed)
Access Code: [email or web address]  URL: [email or web address]  Date: 03/03/2019  Prepared by: Willow Ora   Exercises Seated Hamstring Stretch - 3 reps - 1 sets - 30 hold - 1x daily - 5x weekly Sit to Stand with Counter Support - 10 reps - 1 sets - 1x daily - 5x weekly Heel rises with counter support - 10 reps - 1 sets - 1x daily - 5x weekly Mini Squat with Counter Support - 10 reps - 1 sets - 1x daily - 5x weekly

## 2019-03-04 ENCOUNTER — Ambulatory Visit: Payer: Medicare Other | Admitting: Occupational Therapy

## 2019-03-05 NOTE — Therapy (Signed)
[facility] [address] [place], [place], [address] Phone: [phone]   Fax:  [phone]  Physical Therapy Treatment  Patient Details  Name: Vin Yonke MRN: [id number] Date of Birth: 15-Jan-1941 Referring Provider (PT): Alger Simons, MD (referral from Reesa Chew, [place] but more hospitalist)   Encounter Date: 03/03/2019   03/03/19 1730  PT Visits / Re-Eval  Visit Number 2  Number of Visits 25  Date for PT Re-Evaluation 05/25/19  Authorization  Authorization Type UHC Medicare  (VA will not cover without referral from the [place])  Authorization Time Period Co pay waved until 9.30.20  PT copay $30,  OOP $3600  $735 applied  PT Time Calculation  PT Start Time 1448  PT Stop Time 1528  PT Time Calculation (min) 40 min  PT - End of Session  Equipment Utilized During Treatment Gait belt  Activity Tolerance Patient tolerated treatment well  Behavior During Therapy [facility] for tasks assessed/performed;Flat affect      Past Medical History:  Diagnosis Date  . Brain cancer ([place])   . Glioblastoma ([place]) 09/25/2017  . Hyperglycemia 09/25/2017  . Seizure ([place]) 09/25/2017    Past Surgical History:  Procedure Laterality Date  . APPLICATION OF CRANIAL NAVIGATION N/A 09/30/2017   Procedure: APPLICATION OF CRANIAL NAVIGATION;  Surgeon: [name], MD;  Location: [facility];  Service: Neurosurgery;  Laterality: N/A;  . CRANIOTOMY N/A 09/30/2017   Procedure: CRANIOTOMY FOR RESECTION OF BRAIN TUMOR WITH STEREOTACTIC;  Surgeon: [name], MD;  Location: [facility];  Service: Neurosurgery;  Laterality: N/A;  CRANIOTOMY FOR RESECTION OF BRAIN TUMOR WITH STEREOTACTIC    There were no vitals filed for this visit.     03/03/19 1457  Symptoms/Limitations  Subjective Had a fall over the weekend. Got up by him self and lost his balance. Daughter and grandson helped him up. Stated he would wait from now on for assistance.  Patient is  accompained by: Family member  Pertinent History March 2019 glioblastoma (s/p sg resection, radation & chemo), grand mal seizure, PE on Eliquis  Limitations Lifting;Standing;Walking;House hold activities  Pain Assessment  Currently in Pain? No/denies  Pain Score 0      03/03/19 1730  Transfers  Transfers Sit to Stand;Stand to Sit  Sit to Stand 3: Mod assist;With upper extremity assist;With armrests;From chair/3-in-1;Multiple attempts ([place] to [place] varies with fatigue)  Sit to Stand Details (indicate cue type and reason) improved with skilled instruction and removing RW from in front of him so he does not pull up on it.   Stand to Sit 4: Min assist;With upper extremity assist;With armrests;To chair/3-in-1;Uncontrolled descent  Stand to Sit Details worked on use of bil UEs to control descent with sitting down.   Ambulation/Gait  Ambulation/Gait Yes  Ambulation/Gait Assistance 3: Mod assist  Ambulation/Gait Assistance Details multimodal cues for walker position, step length and posture with gait.   Ambulation Distance (Feet) 90 Feet (x2 reps)  Assistive device Rolling walker  Gait Pattern Step-to pattern;Decreased step length - left;Decreased stance time - right;Decreased stride length;Decreased hip/knee flexion - right;Decreased hip/knee flexion - left;Right foot flat;Left foot flat;Right flexed knee in stance;Left flexed knee in stance;Shuffle;Lateral hip instability;Trunk flexed  Ambulation Surface Level;Indoor  Exercises  Exercises Other Exercises  Other Exercises  educated on and issued HEP. refer to [place] program for full details.     Access Code: [email or web address]  URL: [email or web address]  Date: 03/03/2019  Prepared by: Willow Ora   Exercises Seated Hamstring Stretch - 3 reps -  1 sets - 30 hold - 1x daily - 5x weekly Sit to Stand with Counter Support - 10 reps - 1 sets - 1x daily - 5x weekly Heel rises with counter support - 10 reps - 1 sets - 1x daily - 5x  weekly Mini Squat with Counter Support - 10 reps - 1 sets - 1x daily - 5x weekly    03/03/19 2045  PT Education  Education Details HEP for strengthening and balance  Person(s) Educated Patient;Child(ren)  Methods Explanation;Demonstration;Verbal cues;Handout  Comprehension Verbalized understanding;Returned demonstration;Verbal cues required;Need further instruction     PT Short Term Goals - 02/25/19 0931      PT SHORT TERM GOAL #1   Title  Patient & daughter verbalize & demonstrate understanding of initial HEP.  (All STGs Target Date: 03/27/2019)    Time  4    Period  Weeks    Status  New    Target Date  03/27/19      PT SHORT TERM GOAL #2   Title  Sit to/from stand from chairs with armrests to RW and stabilizes upon arising with minimal assist.    Time  4    Period  Weeks    Status  New    Target Date  03/27/19      PT SHORT TERM GOAL #3   Title  Patient able to reach 5" anteriorly & to knee with RW support with supervision.    Time  4    Period  Weeks    Status  New    Target Date  03/27/19      PT SHORT TERM GOAL #4   Title  Patient ambulates 100' around furniture carrying conversation with supervision.    Time  4    Period  Weeks    Status  New    Target Date  03/27/19        PT Long Term Goals - 02/25/19 0925      PT LONG TERM GOAL #1   Title  Patient & family verbalize & demonstrate understanding of ongoing HEP to address flexibility, strength, balance & endurance.  (All LTGs Target Date: 05/22/2019)    Time  12    Period  Weeks    Status  New    Target Date  05/22/19      PT LONG TERM GOAL #2   Title  Patient able to transfer sit/stand including stabilization to rolling walker modified independent.    Time  12    Period  Weeks    Status  New    Target Date  05/22/19      PT LONG TERM GOAL #3   Title  Patient able to perform standing balance with external support / RW reaching 5", reaches to knee level & manages clothes for toileting modified  independent.    Time  12    Period  Weeks    Status  New    Target Date  05/22/19      PT LONG TERM GOAL #4   Title  Patient ambulates around furniture 100' with RW modified independent for household mobility.    Time  12    Period  Weeks    Status  New    Target Date  05/22/19      PT LONG TERM GOAL #5   Title  Patient ambulates 250' with RW outdoors on paved surfaces with supervision to enable community mobility with family.    Time  12  Period  Weeks    Status  New    Target Date  05/22/19      PT LONG TERM GOAL #6   Title  Patient negotiates ramps & curbs with RW with minimal assist to enable community access with family.    Time  12    Period  Weeks    Status  New    Target Date  05/22/19          03/03/19 1730  Plan  Clinical Impression Statement Today's skilled session focused on establishment of an HEP, safety with sit<>stand transfes and gait with RW. The pt will need continued work on both of these. He is making progress and should benefit from continued PT to progress toward unmet goals.  Personal Factors and Comorbidities Age;Comorbidity 3+;Fitness;Past/Current Experience  Comorbidities Glioblastoma, seizures, PE, encephalopathy  Examination-Activity Limitations Bathing;Bed Mobility;Hygiene/Grooming;Locomotion Level;Reach Overhead;Stairs;Stand;Toileting;Transfers  Examination-Participation Restrictions Church;Community Activity (lives with adult dtr who works 3rd shift & 75yo grandson; was able to stay alone prior recent hospitalization & now requires 24hr supervision / assistance)  Pt will benefit from skilled therapeutic intervention in order to improve on the following deficits Abnormal gait;Decreased activity tolerance;Decreased balance;Decreased coordination;Decreased endurance;Decreased mobility;Decreased range of motion;Decreased strength;Impaired flexibility;Postural dysfunction  Stability/Clinical Decision Making Evolving/Moderate complexity  Rehab  Potential Good  PT Frequency 2x / week  PT Duration 12 weeks  PT Treatment/Interventions ADLs/Self Care Home Management;DME Instruction;Gait training;Stair training;Functional mobility training;Therapeutic activities;Therapeutic exercise;Balance training;Neuromuscular re-education;Patient/family education;Passive range of motion;Vestibular  PT Next Visit Plan continue to work on strengthening (posture and LE), transfer training and gait training  Consulted and Agree with Plan of Care Patient;Family member/caregiver  Family Member Consulted dtr, Rafi Kenneth         Patient will benefit from skilled therapeutic intervention in order to improve the following deficits and impairments:  Abnormal gait, Decreased activity tolerance, Decreased balance, Decreased coordination, Decreased endurance, Decreased mobility, Decreased range of motion, Decreased strength, Impaired flexibility, Postural dysfunction  Visit Diagnosis: Abnormal posture  Contracture of muscle, multiple sites  Muscle weakness (generalized)  Unsteadiness on feet  Other abnormalities of gait and mobility  Other symptoms and signs involving the nervous system     Problem List Patient Active Problem List   Diagnosis Date Noted  . Hypoalbuminemia due to protein-calorie malnutrition ([place])   . Metabolic encephalopathy 05/09/8526  . Seizures ([place]) 02/03/2019  . Right sided weakness 02/02/2019  . Acute metabolic encephalopathy 78/24/2353  . Aphasia 12/29/2018  . Pneumonia 12/22/2017  . HCAP (healthcare-associated pneumonia) 12/22/2017  . Pulmonary embolism ([place]) 12/22/2017  . Tachycardia   . Vitamin B12 deficiency 11/14/2017  . Anemia 11/14/2017  . Fever 11/13/2017  . Dehydration 11/13/2017  . Interstitial pneumonia ([place]) 11/11/2017  . Sepsis ([place]) 11/09/2017  . Bacteremia due to Gram-negative bacteria 10/26/2017  . Sepsis due to Escherichia coli (E. coli) ([place]) 10/26/2017  . UTI (urinary tract infection)  10/25/2017  . Seizure ([place]) 09/25/2017  . Frontal glioblastoma multiforme ([place]) 09/25/2017  . Hyperglycemia 09/25/2017  . BLOOD IN STOOL 07/26/2008  . ERECTILE DYSFUNCTION 07/12/2008    Willow Ora, PTA, [facility] Outpatient Neuro [facility] [address], [place], [place] [address] [phone] 03/05/19, 11:48 AM   Name: Lizandro Spellman MRN: [id number] Date of Birth: 05-01-1941

## 2019-03-10 ENCOUNTER — Ambulatory Visit: Payer: Medicare Other | Admitting: Occupational Therapy

## 2019-03-10 ENCOUNTER — Encounter: Payer: Self-pay | Admitting: Physical Therapy

## 2019-03-10 ENCOUNTER — Encounter: Payer: Self-pay | Admitting: Occupational Therapy

## 2019-03-10 ENCOUNTER — Ambulatory Visit: Payer: Medicare Other | Admitting: Physical Therapy

## 2019-03-10 ENCOUNTER — Other Ambulatory Visit: Payer: Self-pay

## 2019-03-10 DIAGNOSIS — R293 Abnormal posture: Secondary | ICD-10-CM

## 2019-03-10 DIAGNOSIS — R2681 Unsteadiness on feet: Secondary | ICD-10-CM

## 2019-03-10 DIAGNOSIS — M6281 Muscle weakness (generalized): Secondary | ICD-10-CM

## 2019-03-10 DIAGNOSIS — M6249 Contracture of muscle, multiple sites: Secondary | ICD-10-CM

## 2019-03-10 DIAGNOSIS — R41842 Visuospatial deficit: Secondary | ICD-10-CM

## 2019-03-10 DIAGNOSIS — R278 Other lack of coordination: Secondary | ICD-10-CM

## 2019-03-10 DIAGNOSIS — R41840 Attention and concentration deficit: Secondary | ICD-10-CM

## 2019-03-10 DIAGNOSIS — I69951 Hemiplegia and hemiparesis following unspecified cerebrovascular disease affecting right dominant side: Secondary | ICD-10-CM

## 2019-03-10 DIAGNOSIS — R2689 Other abnormalities of gait and mobility: Secondary | ICD-10-CM

## 2019-03-10 DIAGNOSIS — R208 Other disturbances of skin sensation: Secondary | ICD-10-CM

## 2019-03-10 NOTE — Therapy (Signed)
[facility] [address] [place], [place], [address] Phone: [phone]   Fax:  [phone]  Physical Therapy Treatment  Patient Details  Name: Christopher Morris MRN: [id number] Date of Birth: 1941-05-30 Referring Provider (PT): Alger Simons, MD (referral from Reesa Chew, [place] but more hospitalist)   Encounter Date: 03/10/2019  PT End of Session - 03/10/19 1501    Visit Number  3    Number of Visits  25    Date for PT Re-Evaluation  05/25/19    Authorization Type  UHC Medicare  (VA will not cover without referral from the [place])    Authorization Time Period  Co pay waved until 9.30.20  PT copay $30,  OOP $3600  $735 applied    PT Start Time  1455   pt late for appt today   PT Stop Time  1530    PT Time Calculation (min)  35 min    Equipment Utilized During Treatment  Gait belt    Activity Tolerance  Patient tolerated treatment well    Behavior During Therapy  [facility] for tasks assessed/performed;Flat affect       Past Medical History:  Diagnosis Date  . Brain cancer ([place])   . Glioblastoma ([place]) 09/25/2017  . Hyperglycemia 09/25/2017  . Seizure ([place]) 09/25/2017    Past Surgical History:  Procedure Laterality Date  . APPLICATION OF CRANIAL NAVIGATION N/A 09/30/2017   Procedure: APPLICATION OF CRANIAL NAVIGATION;  Surgeon: [name], MD;  Location: [facility];  Service: Neurosurgery;  Laterality: N/A;  . CRANIOTOMY N/A 09/30/2017   Procedure: CRANIOTOMY FOR RESECTION OF BRAIN TUMOR WITH STEREOTACTIC;  Surgeon: [name], MD;  Location: [facility];  Service: Neurosurgery;  Laterality: N/A;  CRANIOTOMY FOR RESECTION OF BRAIN TUMOR WITH STEREOTACTIC    There were no vitals filed for this visit.  Subjective Assessment - 03/10/19 1500    Subjective  No new falls. Daughter and pt report the ex's are going well at home.    Patient is accompained by:  Family member    Pertinent History  March 2019 glioblastoma (s/p sg  resection, radation & chemo), grand mal seizure, PE on Eliquis    Limitations  Lifting;Standing;Walking;House hold activities    Patient Stated Goals  he wants to be able walk & balance like before recent hospitalization    Currently in Pain?  No/denies    Pain Score  0-No pain           [facility] Adult PT Treatment/Exercise - 03/10/19 2357      Transfers   Transfers  Sit to Stand;Stand to Sit    Sit to Stand  3: Mod assist;4: Min assist;From elevated surface;With upper extremity assist;From bed;From chair/3-in-1    Sit to Stand Details  Tactile cues for sequencing;Tactile cues for weight shifting;Verbal cues for sequencing;Verbal cues for technique;Verbal cues for precautions/safety;Verbal cues for safe use of DME/AE;Manual facilitation for weight shifting    Stand to Sit  4: Min assist;3: Mod assist;With upper extremity assist;To bed;To chair/3-in-1    Stand to Sit Details (indicate cue type and reason)  Tactile cues for sequencing;Tactile cues for weight shifting;Verbal cues for sequencing;Verbal cues for technique;Verbal cues for precautions/safety;Manual facilitation for weight shifting;Verbal cues for safe use of DME/AE    Comments  worked on transfer technique with step by step cues needed. decreased assitance needed from elevated surfaces and after repeated reps.      Ambulation/Gait   Ambulation/Gait  Yes    Ambulation/Gait Assistance  4: Min assist;3: Mod assist    Ambulation/Gait Assistance Details  multi modal cues needed for posture, increased right step length, and to stay with walker with gait. faciliation needed to weight shift left to allow for increased right step length.                           Ambulation Distance (Feet)  80 Feet   x1, 20 x1   Assistive device  Rolling walker    Gait Pattern  Step-to pattern;Decreased step length - left;Decreased stance time - right;Decreased stride length;Decreased hip/knee flexion - right;Decreased hip/knee flexion - left;Right foot  flat;Left foot flat;Right flexed knee in stance;Left flexed knee in stance;Shuffle;Lateral hip instability;Trunk flexed    Ambulation Surface  Level;Indoor      Neuro Re-ed    Neuro Re-ed Details   for strengthening/balance/muscle re-ed: in tall kneeling with UE support blue bench (mod assist of 2 to safely get into position with step by step cues): mini squats 2 sets of 10 reps with cues/assist for form/technique, staying in tall kneeling working on alternating UE raises for 10 reps each side, with UE support on bench worked on lateral weight shifting with emphasis on tall posture, then worked on moving LE fwd/bwd for 5 reps each side. Assist needed to lift foot to allow LE to move. multimodal cues needed on posture and ex form/technique; transitioned pt into quadruped on mat table with foam bubble under right hand for comfort: worked on weight shifting forward/backward with support to right UE and cues/facilitation for weight shifting. mod assist with cues to come from quadruped to sitting on edge of mat table.                                  PT Short Term Goals - 02/25/19 0931      PT SHORT TERM GOAL #1   Title  Patient & daughter verbalize & demonstrate understanding of initial HEP.  (All STGs Target Date: 03/27/2019)    Time  4    Period  Weeks    Status  New    Target Date  03/27/19      PT SHORT TERM GOAL #2   Title  Sit to/from stand from chairs with armrests to RW and stabilizes upon arising with minimal assist.    Time  4    Period  Weeks    Status  New    Target Date  03/27/19      PT SHORT TERM GOAL #3   Title  Patient able to reach 5" anteriorly & to knee with RW support with supervision.    Time  4    Period  Weeks    Status  New    Target Date  03/27/19      PT SHORT TERM GOAL #4   Title  Patient ambulates 100' around furniture carrying conversation with supervision.    Time  4    Period  Weeks    Status  New    Target Date  03/27/19        PT Long Term  Goals - 02/25/19 0925      PT LONG TERM GOAL #1   Title  Patient & family verbalize & demonstrate understanding of ongoing HEP to address flexibility, strength, balance & endurance.  (All LTGs Target Date: 05/22/2019)    Time  12  Period  Weeks    Status  New    Target Date  05/22/19      PT LONG TERM GOAL #2   Title  Patient able to transfer sit/stand including stabilization to rolling walker modified independent.    Time  12    Period  Weeks    Status  New    Target Date  05/22/19      PT LONG TERM GOAL #3   Title  Patient able to perform standing balance with external support / RW reaching 5", reaches to knee level & manages clothes for toileting modified independent.    Time  12    Period  Weeks    Status  New    Target Date  05/22/19      PT LONG TERM GOAL #4   Title  Patient ambulates around furniture 100' with RW modified independent for household mobility.    Time  12    Period  Weeks    Status  New    Target Date  05/22/19      PT LONG TERM GOAL #5   Title  Patient ambulates 250' with RW outdoors on paved surfaces with supervision to enable community mobility with family.    Time  12    Period  Weeks    Status  New    Target Date  05/22/19      PT LONG TERM GOAL #6   Title  Patient negotiates ramps & curbs with RW with minimal assist to enable community access with family.    Time  12    Period  Weeks    Status  New    Target Date  05/22/19            Plan - 03/10/19 1501    Clinical Impression Statement  Today's skilled session continued to focus on transfer training, gait training with RW and strengthening/postural control. The pt continues to need cues/redirection to task with session. The pt is making steady progress toward goals and should benefit from continued PT to progress toward unmet goals.    Personal Factors and Comorbidities  Age;Comorbidity 3+;Fitness;Past/Current Experience    Comorbidities  Glioblastoma, seizures, PE, encephalopathy     Examination-Activity Limitations  Bathing;Bed Mobility;Hygiene/Grooming;Locomotion Level;Reach Overhead;Stairs;Stand;Toileting;Transfers    Examination-Participation Restrictions  Church;Community Activity   lives with adult dtr who works 3rd shift & 66yo grandson; was able to stay alone prior recent hospitalization & now requires 24hr supervision / assistance   Stability/Clinical Decision Making  Evolving/Moderate complexity    Rehab Potential  Good    PT Frequency  2x / week    PT Duration  12 weeks    PT Treatment/Interventions  ADLs/Self Care Home Management;DME Instruction;Gait training;Stair training;Functional mobility training;Therapeutic activities;Therapeutic exercise;Balance training;Neuromuscular re-education;Patient/family education;Passive range of motion;Vestibular    PT Next Visit Plan  continue to work on strengthening (posture and LE), transfer training and gait training    Consulted and Agree with Plan of Care  Patient;Family member/caregiver    Family Member Consulted  dtr, Christopher Morris       Patient will benefit from skilled therapeutic intervention in order to improve the following deficits and impairments:  Abnormal gait, Decreased activity tolerance, Decreased balance, Decreased coordination, Decreased endurance, Decreased mobility, Decreased range of motion, Decreased strength, Impaired flexibility, Postural dysfunction  Visit Diagnosis: Abnormal posture  Contracture of muscle, multiple sites  Muscle weakness (generalized)  Unsteadiness on feet  Other abnormalities of gait and mobility  Problem List Patient Active Problem List   Diagnosis Date Noted  . Hypoalbuminemia due to protein-calorie malnutrition ([place])   . Metabolic encephalopathy [id number]  . Seizures ([place]) 02/03/2019  . Right sided weakness 02/02/2019  . Acute metabolic encephalopathy [id number]  . Aphasia 12/29/2018  . Pneumonia 12/22/2017  . HCAP (healthcare-associated pneumonia)  12/22/2017  . Pulmonary embolism ([place]) 12/22/2017  . Tachycardia   . Vitamin B12 deficiency 11/14/2017  . Anemia 11/14/2017  . Fever 11/13/2017  . Dehydration 11/13/2017  . Interstitial pneumonia ([place]) 11/11/2017  . Sepsis ([place]) 11/09/2017  . Bacteremia due to Gram-negative bacteria 10/26/2017  . Sepsis due to Escherichia coli (E. coli) ([place]) 10/26/2017  . UTI (urinary tract infection) 10/25/2017  . Seizure ([place]) 09/25/2017  . Frontal glioblastoma multiforme ([place]) 09/25/2017  . Hyperglycemia 09/25/2017  . BLOOD IN STOOL 07/26/2008  . ERECTILE DYSFUNCTION 07/12/2008   Willow Ora, PTA, [facility] Outpatient Neuro [facility] [address], [place], [place] [address] [phone] 03/11/19, 2:43 PM   Name: Christopher Morris MRN: [id number] Date of Birth: 1941-05-06

## 2019-03-10 NOTE — Therapy (Signed)
[facility] [address] [place], [place], [address] Phone: [phone]   Fax:  [phone]  Occupational Therapy Evaluation  Patient Details  Name: Christopher Morris MRN: [id number] Date of Birth: 13-Jan-1941 No data recorded  Encounter Date: 03/10/2019  OT End of Session - 03/10/19 1852    Visit Number  1    Number of Visits  17    Date for OT Re-Evaluation  05/12/19       Past Medical History:  Diagnosis Date  . Brain cancer ([place])   . Glioblastoma ([place]) 09/25/2017  . Hyperglycemia 09/25/2017  . Seizure ([place]) 09/25/2017    Past Surgical History:  Procedure Laterality Date  . APPLICATION OF CRANIAL NAVIGATION N/A 09/30/2017   Procedure: APPLICATION OF CRANIAL NAVIGATION;  Surgeon: [name], MD;  Location: [facility];  Service: Neurosurgery;  Laterality: N/A;  . CRANIOTOMY N/A 09/30/2017   Procedure: CRANIOTOMY FOR RESECTION OF BRAIN TUMOR WITH STEREOTACTIC;  Surgeon: [name], MD;  Location: [facility];  Service: Neurosurgery;  Laterality: N/A;  CRANIOTOMY FOR RESECTION OF BRAIN TUMOR WITH STEREOTACTIC    There were no vitals filed for this visit.  Subjective Assessment - 03/10/19 1535    Subjective   My balance    Patient is accompanied by:  Family member   daughter Jacob Moores   Pertinent History  Glioblastoma March 2019 , radiation, chemo - seizures- then CIR    Currently in Pain?  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     Leisure  outdoors - lawn, garden,       ADL   Eating/Feeding  Minimal assistance    Grooming  Moderate assistance    Upper Body Bathing  Minimal assistance    Lower Body Bathing  Moderate assistance    Upper Body Dressing  Minimal assistance    Lower Body Dressing  Maximal assistance    Toilet Transfer  Moderate assistance    Toilet Transfer Method  --   walker does not fit into bathroom   Toileting - Clothing Manipulation  Moderate assistance    Toileting -  Hygiene  Moderate assistance    Tub/Shower Transfer  Moderate assistance      Written Expression   Dominant Hand  Right    Handwriting  Severe micrographia;50% legible      Vision - History   Baseline Vision  Bifocals    Patient Visual Report  Peripheral vision impairment    Additional Comments  Per daughter - loss of right peripheral vision - patient hits obstacles on right      Vision Assessment   Eye Alignment  Within Functional Limits    Vision Assessment  Vision impaired  _ to be further tested in functional context    Ocular Range of Motion  Restricted on right    Alignment/Gaze  Preference  Within Defined Limits    Tracking/Visual Pursuits  Decreased smoothness of horizontal tracking    Saccades  Additional head turns occurred during testing    Comment  Difficulty following instructions - inattentive to right side      Cognition   Overall Cognitive Status  Impaired/Different from baseline    Attention  Focused    Cognition Comments  Loses attention after 5 sec - difficulty attending long enought o answer biographical queestions      Posture/Postural Control   Posture/Postural Control  Postural limitations    Postural Limitations  Rounded Shoulders;Flexed trunk      Sensation   Light Touch  Appears Intact    Stereognosis  Impaired by gross assessment    Proprioception  Impaired by gross assessment      Coordination   Gross Motor Movements are Fluid and Coordinated  No    Fine Motor Movements are Fluid and  Coordinated  No    Finger Nose Finger Test  --   unable   Box and Blocks  R:7  L: 26    Tremors  thumb      Perception   Perception  Impaired    Inattention/Neglect  Does not attend to right visual field      Praxis   Praxis  Impaired    Praxis Impairment Details  Ideomotor      PROM   Overall PROM   Deficits    PROM Assessment Site  Shoulder;Elbow    Right/Left Shoulder  Right    Right Shoulder Flexion  80 Degrees    Right Shoulder ABduction  80 Degrees    Right/Left Elbow  Right    Right Elbow Flexion  40    Right Elbow Extension  165      Strength   Overall Strength  Deficits      Hand Function   Right Hand Gross Grasp  Impaired    Right Hand Grip (lbs)  29    Left Hand Gross Grasp  Functional    Left Hand Grip (lbs)  70                      OT Education - 03/10/19 1852    Education Details  Results o OT eval and potential goals    Person(s) Educated  Patient;Child(ren)    Methods  Explanation    Comprehension  Verbalized understanding;Need further instruction       OT Short Term Goals - 03/10/19 1859      OT SHORT TERM GOAL #1   Title  Patient will bathe his upper body with min assist after set up    Time  4    Period  Weeks    Status  New    Target Date  04/14/19      OT SHORT TERM GOAL #2   Title  Patient will bathe his lower body with min assist    Time  4    Period  Weeks    Status  New      OT SHORT TERM GOAL #3   Title  Patient will don elastic waist pants with min assist    Time  4    Period  Weeks    Status  New      OT SHORT TERM GOAL #4   Title  Patient will don socks with min assist    Time  4    Period  Weeks  Status  New        OT Long Term Goals - 03/10/19 1900      OT LONG TERM GOAL #1   Title  Patient will complete a home activities program designed to incorporate use of RUE with mod cueing    Time  8    Period  Weeks    Status  New    Target Date  05/12/19      OT LONG TERM GOAL #2   Title   Patient will dress his lower body with min assist    Time  8    Period  Weeks    Status  New      OT LONG TERM GOAL #3   Title  Patient will bathe himself with supervision    Time  8    Period  Weeks    Status  New      OT LONG TERM GOAL #4   Title  Patient will use his RUE to feed himself finger foods with mod cueing    Time  8    Period  Weeks    Status  New      OT LONG TERM GOAL #5   Title  Patient will navigate through busy environment (walking with RW) avoiding obstacles on his right side 75%/time    Time  8    Period  Weeks    Status  New            Plan - 03/10/19 1853    Clinical Impression Statement  Patient is an 78 year old man with glioblastoma, and subsequent seizure disorder and encephalopathy.  Patient presents during OT evaluation with significant impairments of cognition - attention, awareness, Right inattention, hemiplegia, Right visual field deficit, balance, coordination, and overall endurance which impede his ability to complete basic self care skills.  Patient has had multiple falls since home from inpatient rehab.  Patient may benefit from skilled OT intervention to decrease caregiver burden and to improve safety with basic self care tasks.    OT Occupational Profile and History  Comprehensive Assessment- Review of records and extensive additional review of physical, cognitive, psychosocial history related to current functional performance    Occupational performance deficits (Please refer to evaluation for details):  ADL's;Rest and Sleep;Work;IADL's;Leisure;Social Participation    Body Structure / Function / Physical Skills  ADL;Coordination;Endurance;GMC;UE functional use;Balance;Decreased knowledge of precautions;Sensation;Body mechanics;Flexibility;IADL;Vision;Dexterity;FMC;Improper spinal/pelvic alignment;Proprioception;Strength;Tone;ROM;Pain    Cognitive Skills  Attention;Orientation;Understand;Perception;Problem Solve;Energy/Drive;Safety  Awareness;Learn;Sequencing;Memory    Rehab Potential  Fair    Clinical Decision Making  Multiple treatment options, significant modification of task necessary    Comorbidities Affecting Occupational Performance:  Presence of comorbidities impacting occupational performance    Comorbidities impacting occupational performance description:  glioblastoma - anticiapte add'l treatment    Modification or Assistance to Complete Evaluation   Min-Moderate modification of tasks or assist with assess necessary to complete eval    OT Frequency  2x / week    OT Duration  8 weeks    OT Treatment/Interventions  Self-care/ADL training;Therapeutic exercise;Visual/perceptual remediation/compensation;Patient/family education;Splinting;Neuromuscular education;Moist Heat;Functional Furniture conservator/restorer;Therapeutic activities;Balance training;DME and/or AE instruction;Manual Therapy;Cognitive remediation/compensation    Plan  Assess functional mobility and transitional movements as needed for bathing and dressing    Consulted and Agree with Plan of Care  Patient;Family member/caregiver    Family Member Consulted  DaughterDanae Chen       Patient will benefit from skilled therapeutic intervention in order to improve the following deficits and impairments:  Body Structure / Function / Physical Skills: ADL, Coordination, Endurance, GMC, UE functional use, Balance, Decreased knowledge of precautions, Sensation, Body mechanics, Flexibility, IADL, Vision, Dexterity, FMC, Improper spinal/pelvic alignment, Proprioception, Strength, Tone, ROM, Pain Cognitive Skills: Attention, Orientation, Understand, Perception, Problem Solve, Energy/Drive, Safety Awareness, Learn, Sequencing, Memory     Visit Diagnosis: Hemiplegia and hemiparesis 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 Hypoalbuminemia due to protein-calorie malnutrition ([facility])   . Metabolic encephalopathy [id number]  . Seizures ([place]) 02/03/2019  . Right sided weakness 02/02/2019  . Acute metabolic encephalopathy [id number]  . Aphasia 12/29/2018  . Pneumonia 12/22/2017  . HCAP (healthcare-associated pneumonia) 12/22/2017  . Pulmonary embolism ([facility]) 12/22/2017  . Tachycardia   . Vitamin B12 deficiency 11/14/2017  . Anemia 11/14/2017  . Fever 11/13/2017  . Dehydration 11/13/2017  . Interstitial pneumonia ([place]) 11/11/2017  . Sepsis ([place]) 11/09/2017  . Bacteremia due to Gram-negative bacteria 10/26/2017  . Sepsis due to Escherichia coli (E. coli) ([place] [facility]) 10/26/2017  . UTI (urinary tract infection) 10/25/2017  . Seizure ([facility]) 09/25/2017  . Frontal glioblastoma multiforme ([facility]) 09/25/2017  . Hyperglycemia 09/25/2017  . BLOOD IN STOOL 07/26/2008  . ERECTILE DYSFUNCTION 07/12/2008    Mariah Milling, OTR/L 03/10/2019, 7:05 PM  [facility] [address] [place], [place], [address] Phone: [phone]   Fax:  [phone]  Name: Christopher Morris MRN: [id number] Date of Birth: [birth year]

## 2019-03-17 ENCOUNTER — Ambulatory Visit: Payer: Medicare Other | Admitting: Occupational Therapy

## 2019-03-17 ENCOUNTER — Ambulatory Visit: Payer: Medicare Other | Admitting: Physical Therapy

## 2019-03-18 ENCOUNTER — Encounter: Payer: Self-pay | Admitting: Occupational Therapy

## 2019-03-18 NOTE — Therapy (Signed)
[facility] [address] [place], [place], [address] Phone: [phone]   Fax:  [phone]  Patient Details  Name: Christopher Morris MRN: [id number] Date of Birth: Jan 23, 1941 Referring Provider:  No ref. provider found  Encounter Date: 03/18/2019   OT Discharge Note  Patient was scheduled for therapy yesterday, and did not arrive.   Daughter called today to indicate that patient was too weak to attend OP therapy.  She has cancelled all of his OP therapies, and is setting up [facility] therapy to address patient's needs.  Will discontinue further OP therapy per family's request - patient's declining condition.   Mariah Milling 03/18/2019, 12:51 PM  [facility] [address] [place], [place], [address] Phone: [phone]   Fax:  [phone]

## 2019-03-19 ENCOUNTER — Ambulatory Visit: Payer: Medicare Other | Admitting: Occupational Therapy

## 2019-03-19 ENCOUNTER — Ambulatory Visit: Payer: Medicare Other | Admitting: Rehabilitative and Restorative Service Providers"

## 2019-03-20 ENCOUNTER — Ambulatory Visit (HOSPITAL_COMMUNITY)
Admission: RE | Admit: 2019-03-20 | Discharge: 2019-03-20 | Disposition: A | Discharge status: Home / Self Care | Payer: Medicare Other | Source: Ambulatory Visit | Attending: Internal Medicine | Admitting: Internal Medicine

## 2019-03-20 ENCOUNTER — Other Ambulatory Visit: Payer: Self-pay

## 2019-03-20 DIAGNOSIS — C711 Malignant neoplasm of frontal lobe: Secondary | ICD-10-CM | POA: Insufficient documentation

## 2019-03-20 MED ORDER — GADOBUTROL 1 MMOL/ML IV SOLN
10.0000 mL | Freq: Once | INTRAVENOUS | Status: AC | PRN
  Administered 2019-03-20: 10 mL via INTRAVENOUS

## 2019-03-24 ENCOUNTER — Other Ambulatory Visit: Payer: Self-pay

## 2019-03-24 ENCOUNTER — Inpatient Hospital Stay: Payer: Medicare Other | Attending: Internal Medicine | Admitting: Internal Medicine

## 2019-03-24 VITALS — BP 129/67 | HR 63 | Temp 98.9°F | Resp 17

## 2019-03-24 DIAGNOSIS — C711 Malignant neoplasm of frontal lobe: Secondary | ICD-10-CM | POA: Diagnosis present

## 2019-03-24 DIAGNOSIS — R569 Unspecified convulsions: Secondary | ICD-10-CM | POA: Diagnosis not present

## 2019-03-24 MED ORDER — TEMOZOLOMIDE 20 MG PO CAPS: 40.0000 mg | ORAL_CAPSULE | Freq: Every day | ORAL | 0 refills | Status: DC

## 2019-03-24 MED ORDER — ZONISAMIDE 100 MG PO CAPS: 100.0000 mg | ORAL_CAPSULE | Freq: Every day | ORAL | 3 refills | Status: DC

## 2019-03-24 MED ORDER — TEMOZOLOMIDE 140 MG PO CAPS: 280.0000 mg | ORAL_CAPSULE | Freq: Every day | ORAL | 0 refills | Status: DC

## 2019-03-24 MED ORDER — LACOSAMIDE 50 MG PO TABS: 50.0000 mg | ORAL_TABLET | Freq: Two times a day (BID) | ORAL | 3 refills | Status: DC

## 2019-03-24 MED ORDER — DEXAMETHASONE 2 MG PO TABS: ORAL_TABLET | ORAL | 0 refills | Status: DC

## 2019-03-24 MED FILL — ZONISAMIDE 100 MG CAPSULE: 100 | 30 days supply | Qty: 30 | Fill #0

## 2019-03-24 MED FILL — DEXAMETHASONE 2 MG TABLET: 2 | 28 days supply | Qty: 35 | Fill #0

## 2019-03-24 MED FILL — levETIRAcetam 250 MG TABS: 250 | 30 days supply | Qty: 300 | Fill #1

## 2019-03-24 NOTE — Progress Notes (Signed)
[facility] [place], [place] [address] [phone]   Interval Evaluation  Date of Service: 03/24/19 Patient Name: Christopher Morris Patient MRN: [id number] Patient DOB: October 14, 1940 Provider: Ventura Sellers, MD  Identifying Statement:  Christopher Morris is a 78 y.o. male with left frontal glioblastoma    Oncologic History: Oncology History  Frontal glioblastoma multiforme ([facility])  09/30/2017 Surgery   Craniotomy, resection by Dr. Sherwood Gambler.  Path demonstrates glioblastoma   10/28/2017 -  Radiation Therapy   IMRT with concurrent Temodar   01/07/2018 -  Chemotherapy   The patient had [No matching medication found in this treatment plan]  for chemotherapy treatment.      Biomarkers:  MGMT Methylated.  IDH 1/2 Unknown.  EGFR Amplified  EGFRvIII positive   Interval History:  Christopher Morris presents today for follow up after recent hospitalization for breakthrough seizure and Todd's paralysis.  He and his daughter describe relative return to baseline since admission, but daughter feels his speaking/language are more impaired than previous.  Still with little functional use of right hand, requires help with most ADLs.  Continues to walk with his walker or some form of assistasnce. Otherwise denies additional seizures since discharge, headaches.    Medications: Current Outpatient Medications on File Prior to Visit  Medication Sig Dispense Refill  . apixaban (ELIQUIS) 5 MG TABS tablet Take 1 tablet (5 mg total) by mouth 2 (two) times daily. 60 tablet 0  . levETIRAcetam (KEPPRA) 250 MG tablet Take 6 tablets (1,500 mg total) by mouth 2 (two) times daily.    . Multiple Vitamin (MULTIVITAMIN) tablet Take 1 tablet by mouth daily.    . vitamin B-12 (CYANOCOBALAMIN) 500 MCG tablet Take 500 mcg by mouth daily.     No current facility-administered medications on file prior to visit.     Allergies:  Allergies  Allergen Reactions  .  Pork-Derived Products     Patient does not eat pork  . Shellfish Allergy     Patient does not eat shellfish   Past Medical History:  Past Medical History:  Diagnosis Date  . Brain cancer ([facility])   . Glioblastoma ([facility]) 09/25/2017  . Hyperglycemia 09/25/2017  . Seizure ([facility]) 09/25/2017   Past Surgical History:  Past Surgical History:  Procedure Laterality Date  . APPLICATION OF CRANIAL NAVIGATION N/A 09/30/2017   Procedure: APPLICATION OF CRANIAL NAVIGATION;  Surgeon: [name], MD;  Location: [facility];  Service: Neurosurgery;  Laterality: N/A;  . CRANIOTOMY N/A 09/30/2017   Procedure: CRANIOTOMY FOR RESECTION OF BRAIN TUMOR WITH STEREOTACTIC;  Surgeon: [name], MD;  Location: [facility];  Service: Neurosurgery;  Laterality: N/A;  CRANIOTOMY FOR RESECTION OF BRAIN TUMOR WITH STEREOTACTIC   Social History:  Social History   Socioeconomic History  . Marital status: Divorced    Spouse name: Not on file  . Number of children: Not on file  . Years of education: Not on file  . Highest education level: Not on file  Occupational History  . Not on file  Social Needs  . Financial resource strain: Not on file  . Food insecurity    Worry: Not on file    Inability: Not on file  . Transportation needs    Medical: No    Non-medical: No  Tobacco Use  . Smoking status: Never Smoker  . Smokeless tobacco: Never Used  Substance and Sexual Activity  . Alcohol use: No    Frequency: Never  . Drug  use: No  . Sexual activity: Not Currently  Lifestyle  . Physical activity    Days per week: 5 days    Minutes per session: Not on file  . Stress: Only a little  Relationships  . Social Herbalist on phone: Not on file    Gets together: Not on file    Attends religious service: Not on file    Active member of club or organization: Not on file    Attends meetings of clubs or organizations: Not on file    Relationship status: Not on file  . Intimate partner violence    Fear of  current or ex partner: Not on file    Emotionally abused: Not on file    Physically abused: Not on file    Forced sexual activity: Not on file  Other Topics Concern  . Not on file  Social History Narrative  . Not on file   Family History:  Family History  Problem Relation Age of Onset  . Hypertension Daughter   . Stroke Mother   . Cancer Neg Hx     Review of Systems: Constitutional: Denies fevers, chills or abnormal weight loss Eyes: Denies blurriness of vision Ears, nose, mouth, throat, and face: Denies mucositis or sore throat Respiratory: Denies cough, dyspnea or wheezes Cardiovascular: Denies palpitation, chest discomfort or lower extremity swelling Gastrointestinal:  Denies nausea, constipation, diarrhea GU: Denies dysuria or incontinence Skin: Denies abnormal skin rashes Neurological: Per HPI Musculoskeletal: Denies joint pain, back or neck discomfort. No decrease in ROM Behavioral/Psych: Denies anxiety, disturbance in thought content, and mood instability  Physical Exam: Vitals:   03/24/19 0925  BP: 129/67  Pulse: 63  Resp: 17  Temp: 98.9 F (37.2 C)  SpO2: 100%   KPS: 80. General: Alert, cooperative, pleasant, in no acute distress Head: Craniotomy scar noted, dry and intact. EENT: No conjunctival injection or scleral icterus. Oral mucosa moist Lungs: Resp effort normal Cardiac: Regular rate and rhythm Abdomen: Soft, non-distended abdomen Skin: No rashes cyanosis or petechiae. Extremities: No clubbing or edema  Neurologic Exam: Mental Status: Awake, alert, attentive to examiner. Oriented to self and environment. Language is notably impaired in regards to fluency with impaired comprehension to two step commands.  Cranial Nerves: Visual acuity is grossly normal. Visual fields are full. Extra-ocular movements intact. No ptosis. Face is symmetric, tongue midline. Motor: Tone and bulk are normal. Right arm and leg 4/5, left side 5/5 power. Reflexes are  symmetric, no pathologic reflexes present. Intact finger to nose bilaterally Sensory: Intact to light touch and temperature Gait: Independent but purposeful and wide based   Labs: I have reviewed the data as listed    Component Value Date/Time   NA 136 02/16/2019 1226   NA 136 (A) 10/18/2017   K 4.3 02/16/2019 1226   CL 99 02/16/2019 1226   CO2 26 02/16/2019 1226   GLUCOSE 121 (H) 02/16/2019 1226   BUN 18 02/16/2019 1226   BUN 19 10/18/2017   CREATININE 0.88 02/16/2019 1226   CREATININE 0.95 10/08/2018 1137   CALCIUM 9.5 02/16/2019 1226   PROT 5.9 (L) 02/06/2019 1605   ALBUMIN 3.8 02/06/2019 1605   AST 14 (L) 02/06/2019 1605   AST 13 (L) 10/08/2018 1137   ALT 11 02/06/2019 1605   ALT 11 10/08/2018 1137   ALKPHOS 60 02/06/2019 1605   BILITOT 1.1 02/06/2019 1605   BILITOT 1.2 10/08/2018 1137   GFRNONAA >60 02/16/2019 1226   GFRNONAA >60 10/08/2018  1137   GFRAA >60 02/16/2019 1226   GFRAA >60 10/08/2018 1137   Lab Results  Component Value Date   WBC 6.4 02/20/2019   NEUTROABS 2.9 02/06/2019   HGB 13.5 02/20/2019   HCT 40.2 02/20/2019   MCV 89.3 02/20/2019   PLT 220 02/20/2019   Imaging:  [facility] Clinician Interpretation: I have personally reviewed the CNS images as listed.  My interpretation, in the context of the patient's clinical presentation, is progressive disease  [name] Wo Contrast  Result Date: 03/21/2019 CLINICAL DATA:  Follow-up glioblastoma EXAM: MRI HEAD WITHOUT AND WITH CONTRAST TECHNIQUE: Multiplanar, multiecho pulse sequences of the brain and surrounding structures were obtained without and with intravenous contrast. CONTRAST:  10 cc Gadavist intravenous COMPARISON:  Postcontrast MRI 12/29/2018 FINDINGS: Brain: Progressive disease. There is confluent T2 hyperintensity with mass effect in the left upper cerebral white matter, extending into deep white matter tracts, with progressive effacement of the left lateral ventricle. Irregular/necrotic pattern  enhancement around the resection cavity has progressed with new enhancement seen tracking along the left para median corpus callosum and septum pellucidum. There is greater corpus callosum thickening on coronal T2 weighted acquisition. There is confluent FLAIR hyperintensity in the upper right cerebral white matter with possible increased fullness on coronal imaging. No superimposed acute infarct, hemorrhage, hydrocephalus, or herniation. Vascular: Major flow voids and vascular enhancements are preserved Skull and upper cervical spine: Unremarkable left frontal craniotomy. Disc and facet degeneration in the visible cervical spine. Sinuses/Orbits: Negative IMPRESSION: Progressive disease with increased T2 signal, swelling, and enhancement. Electronically Signed   By: Monte Fantasia M.D.   On: 03/21/2019 08:59    Assessment/Plan 1. Glioblastoma ([place])  2. Seizure [facility])  Mr. Basnett is clinically and radiographically progressive today.  We discussed different options for treatment moving forward, including further chemotherapy, and hospice referral.    He would like to pursue further treatment, and we recommended restarting therapy with 5-day TMZ due to prior positive response and lack of "failure".  Progression occurred while on observation.  We recommended initiating treatment with cycle #10 Temozolomide 150 mg/m2, on for five days and off for twenty three days in twenty eight day cycles. The patient will have a complete blood count performed on days 21 and 28 of each cycle, and a comprehensive metabolic panel performed on day 28 of each cycle. Labs may need to be performed more often. Zofran will prescribed for home use for nausea/vomiting.   Chemotherapy should be held for the following:  ANC less than 1,000  Platelets less than 100,000  LFT or creatinine greater than 2x ULN  If clinical concerns/contraindications develop  He should continue Keppra '1500mg'$  BID, Vimpat '50mg'$  BID and Eliquis.   We recommend he return in 1 months with labs for evaluation, or sooner as needed.  All questions were answered. The patient knows to call the clinic with any problems, questions or concerns. No barriers to learning were detected.  The total time spent in the encounter was 40 minutes and more than 50% was on counseling and review of test results   Ventura Sellers, MD Medical Director of Neuro-Oncology [facility] 03/24/19 9:50 AM

## 2019-03-25 ENCOUNTER — Ambulatory Visit: Payer: Medicare Other | Admitting: Physical Therapy

## 2019-03-25 ENCOUNTER — Ambulatory Visit: Payer: Medicare Other | Admitting: Occupational Therapy

## 2019-03-25 ENCOUNTER — Telehealth: Payer: Self-pay | Admitting: *Deleted

## 2019-03-25 ENCOUNTER — Telehealth: Payer: Self-pay | Admitting: Internal Medicine

## 2019-03-25 NOTE — Telephone Encounter (Signed)
Called and left a voice message of appt date and time.

## 2019-03-25 NOTE — Telephone Encounter (Signed)
Prior authorization request received from [place] powered by Covermymeds for Vimpat 50 mg tablets and Dexamethasone..  Request to Managed Care Prior Authorization forms letter tray for review.

## 2019-03-26 ENCOUNTER — Telehealth: Payer: Self-pay | Admitting: *Deleted

## 2019-03-26 ENCOUNTER — Ambulatory Visit: Payer: Medicare Other | Admitting: Rehabilitative and Restorative Service Providers"

## 2019-03-26 ENCOUNTER — Ambulatory Visit: Payer: Medicare Other | Admitting: Occupational Therapy

## 2019-03-26 NOTE — Telephone Encounter (Signed)
Daughter called to report that the [facility] is trying to work through obtaining the [facility] for the patient through their medication supplier.  She also requested that the last office notes be faxed to Dr Sheela Stack at the [facility] to [phone].  Notes faxed.  Advised daughter to let us know if we needed to do anything further to help in this process.

## 2019-03-26 NOTE — Telephone Encounter (Signed)
03/24/2019 received call from Patients daughter [name].  She advised that pharmacy needed prior auth for Vimpat.  Upon calling pharmacy found out that patient has no prescription benefits and he gets his medication through the [facility] however they are unable to fill Vimpat.  Prior Authorization will not result in approval since patient does not have prescription benefits.  Pharmacy researched a discount card for this medication and found that he had already used the one time benefit of this card.  Due to full cost of drug >$400 ,Dr Mickeal Skinner decided to change drug to Zonegran to see if was more affordable with same drug benefit.

## 2019-03-31 ENCOUNTER — Ambulatory Visit: Payer: Medicare Other | Admitting: Physical Therapy

## 2019-03-31 ENCOUNTER — Encounter: Payer: Medicare Other | Admitting: Occupational Therapy

## 2019-04-02 ENCOUNTER — Ambulatory Visit: Payer: Medicare Other | Admitting: Rehabilitative and Restorative Service Providers"

## 2019-04-02 ENCOUNTER — Encounter: Payer: Medicare Other | Admitting: Occupational Therapy

## 2019-04-07 ENCOUNTER — Encounter: Payer: Medicare Other | Admitting: Occupational Therapy

## 2019-04-07 ENCOUNTER — Ambulatory Visit: Payer: Medicare Other | Admitting: Physical Therapy

## 2019-04-09 ENCOUNTER — Encounter: Payer: Medicare Other | Admitting: Occupational Therapy

## 2019-04-09 ENCOUNTER — Ambulatory Visit: Payer: Medicare Other | Admitting: Rehabilitative and Restorative Service Providers"

## 2019-04-14 ENCOUNTER — Encounter: Payer: Medicare Other | Admitting: Occupational Therapy

## 2019-04-14 ENCOUNTER — Ambulatory Visit: Payer: Medicare Other | Admitting: Physical Therapy

## 2019-04-16 ENCOUNTER — Encounter: Payer: Medicare Other | Admitting: Occupational Therapy

## 2019-04-16 ENCOUNTER — Ambulatory Visit: Payer: Medicare Other | Admitting: Rehabilitative and Restorative Service Providers"

## 2019-04-21 ENCOUNTER — Telehealth: Payer: Self-pay | Admitting: Internal Medicine

## 2019-04-21 ENCOUNTER — Inpatient Hospital Stay: Payer: Medicare Other

## 2019-04-21 ENCOUNTER — Telehealth: Payer: Self-pay

## 2019-04-21 ENCOUNTER — Other Ambulatory Visit: Payer: Self-pay

## 2019-04-21 ENCOUNTER — Inpatient Hospital Stay: Payer: Medicare Other | Attending: Internal Medicine | Admitting: Internal Medicine

## 2019-04-21 VITALS — BP 125/67 | HR 64 | Temp 98.2°F | Resp 18 | Ht 76.0 in | Wt 200.2 lb

## 2019-04-21 DIAGNOSIS — C719 Malignant neoplasm of brain, unspecified: Secondary | ICD-10-CM

## 2019-04-21 DIAGNOSIS — Z79899 Other long term (current) drug therapy: Secondary | ICD-10-CM | POA: Diagnosis not present

## 2019-04-21 DIAGNOSIS — C711 Malignant neoplasm of frontal lobe: Secondary | ICD-10-CM | POA: Insufficient documentation

## 2019-04-21 DIAGNOSIS — R569 Unspecified convulsions: Secondary | ICD-10-CM | POA: Diagnosis not present

## 2019-04-21 LAB — CBC WITH DIFFERENTIAL (CANCER CENTER ONLY)
Abs Immature Granulocytes: 0.03 10*3/uL (ref 0.00–0.07)
Basophils Absolute: 0 10*3/uL (ref 0.0–0.1)
Basophils Relative: 0 %
Eosinophils Absolute: 0.2 10*3/uL (ref 0.0–0.5)
Eosinophils Relative: 3 %
HCT: 40 % (ref 39.0–52.0)
Hemoglobin: 13.5 g/dL (ref 13.0–17.0)
Immature Granulocytes: 0 %
Lymphocytes Relative: 39 %
Lymphs Abs: 2.7 10*3/uL (ref 0.7–4.0)
MCH: 30.2 pg (ref 26.0–34.0)
MCHC: 33.8 g/dL (ref 30.0–36.0)
MCV: 89.5 fL (ref 80.0–100.0)
Monocytes Absolute: 0.4 10*3/uL (ref 0.1–1.0)
Monocytes Relative: 6 %
Neutro Abs: 3.5 10*3/uL (ref 1.7–7.7)
Neutrophils Relative %: 52 %
Platelet Count: 155 10*3/uL (ref 150–400)
RBC: 4.47 MIL/uL (ref 4.22–5.81)
RDW: 12.3 % (ref 11.5–15.5)
WBC Count: 6.9 10*3/uL (ref 4.0–10.5)
nRBC: 0 % (ref 0.0–0.2)

## 2019-04-21 LAB — TSH: TSH: 3.631 u[IU]/mL (ref 0.320–4.118)

## 2019-04-21 LAB — CMP (CANCER CENTER ONLY)
ALT: 12 U/L (ref 0–44)
AST: 11 U/L — ABNORMAL LOW (ref 15–41)
Albumin: 3.6 g/dL (ref 3.5–5.0)
Alkaline Phosphatase: 87 U/L (ref 38–126)
Anion gap: 11 (ref 5–15)
BUN: 16 mg/dL (ref 8–23)
CO2: 27 mmol/L (ref 22–32)
Calcium: 8.8 mg/dL — ABNORMAL LOW (ref 8.9–10.3)
Chloride: 102 mmol/L (ref 98–111)
Creatinine: 0.94 mg/dL (ref 0.61–1.24)
GFR, Est AFR Am: >60 mL/min (ref >60)
GFR, Estimated: >60 mL/min (ref >60)
Glucose, Bld: 102 mg/dL — ABNORMAL HIGH (ref 70–99)
Potassium: 4.1 mmol/L (ref 3.5–5.1)
Sodium: 140 mmol/L (ref 135–145)
Total Bilirubin: 0.7 mg/dL (ref 0.3–1.2)
Total Protein: 6.3 g/dL — ABNORMAL LOW (ref 6.5–8.1)

## 2019-04-21 LAB — VITAMIN D 25 HYDROXY (VIT D DEFICIENCY, FRACTURES): Vit D, 25-Hydroxy: 19.72 ng/mL — ABNORMAL LOW (ref 30–100)

## 2019-04-21 LAB — CORTISOL: Cortisol, Plasma: 15.7 ug/dL

## 2019-04-21 MED ORDER — TEMOZOLOMIDE 140 MG PO CAPS: 280.0000 mg | ORAL_CAPSULE | Freq: Every day | ORAL | 0 refills | Status: DC

## 2019-04-21 MED ORDER — DEXAMETHASONE 2 MG PO TABS: 2.0000 mg | ORAL_TABLET | Freq: Every day | ORAL | 1 refills | Status: DC

## 2019-04-21 MED ORDER — TEMOZOLOMIDE 20 MG PO CAPS: 40.0000 mg | ORAL_CAPSULE | Freq: Every day | ORAL | 0 refills | Status: DC

## 2019-04-21 MED FILL — DEXAMETHASONE 2 MG TABLET: 2 | 30 days supply | Qty: 30 | Fill #0

## 2019-04-21 NOTE — Telephone Encounter (Signed)
Scheduled appt per 10/6 los.  Spoke w/ patient daughter and she is aware of the appt date and time.

## 2019-04-21 NOTE — Progress Notes (Signed)
[facility] at [address], [place] [address] [phone]   Interval Evaluation  Date of Service: 04/21/19 Patient Name: Christopher Morris Patient MRN: [id number] Patient DOB: 08/27/1940 Provider: Ventura Sellers, MD  Identifying Statement:  Haydn Hutsell is a 78 y.o. male with left frontal glioblastoma    Oncologic History: Oncology History  Frontal glioblastoma multiforme ([place])  09/30/2017 Surgery   Craniotomy, resection by Dr. Sherwood Gambler.  Path demonstrates glioblastoma   10/28/2017 -  Radiation Therapy   IMRT with concurrent Temodar   01/07/2018 -  Chemotherapy   The patient had [No matching medication found in this treatment plan]  for chemotherapy treatment.      Biomarkers:  MGMT Methylated.  IDH 1/2 Unknown.  EGFR Amplified  EGFRvIII positive   Interval History:  Sloan Takagi presents today for follow up.  He and his daughter describe relative stability over the past month, although "he is somewhat more energetic on the steroids".  Still with little functional use of right hand, requires help with most ADLs.  Continues to walk with his walker or some form of assistasnce.  Never obtained chemo in the mail and did not dose this past month. Otherwise denies additional seizures since discharge, headaches.    Medications: Current Outpatient Medications on File Prior to Visit  Medication Sig Dispense Refill  . apixaban (ELIQUIS) 5 MG TABS tablet Take 1 tablet (5 mg total) by mouth 2 (two) times daily. 60 tablet 0  . dexamethasone (DECADRON) 2 MG tablet Take #46m x7 days, then #374mx 7 days, then #47m81m7 days, then #1mg90m days, then stop 60 tablet 0  . lacosamide (VIMPAT) 50 MG TABS tablet Take 1 tablet (50 mg total) by mouth 2 (two) times daily. 60 tablet 3  . levETIRAcetam (KEPPRA) 250 MG tablet Take 6 tablets (1,500 mg total) by mouth 2 (two) times daily.    . Multiple Vitamin (MULTIVITAMIN) tablet Take 1 tablet by mouth  daily.    . teMarland Kitchenozolomide (TEMODAR) 140 MG capsule Take 2 capsules (280 mg total) by mouth daily. May take on an empty stomach to decrease nausea & vomiting. 10 capsule 0  . temozolomide (TEMODAR) 20 MG capsule Take 2 capsules (40 mg total) by mouth daily. May take on an empty stomach to decrease nausea & vomiting. 10 capsule 0  . vitamin B-12 (CYANOCOBALAMIN) 500 MCG tablet Take 500 mcg by mouth daily.    . zoMarland Kitchenisamide (ZONEGRAN) 100 MG capsule Take 1 capsule (100 mg total) by mouth daily. 30 capsule 3   No current facility-administered medications on file prior to visit.     Allergies:  Allergies  Allergen Reactions  . Pork-Derived Products     Patient does not eat pork  . Shellfish Allergy     Patient does not eat shellfish   Past Medical History:  Past Medical History:  Diagnosis Date  . Brain cancer (HCC)[place]. Glioblastoma (HCC)[place]13/2019  . Hyperglycemia 09/25/2017  . Seizure (HCC)[place]13/2019   Past Surgical History:  Past Surgical History:  Procedure Laterality Date  . APPLICATION OF CRANIAL NAVIGATION N/A 09/30/2017   Procedure: APPLICATION OF CRANIAL NAVIGATION;  Surgeon: NudeJovita Gamma;  [facility];  Laterality: N/A;  . CRANIOTOMY N/A 09/30/2017   Procedure: CRANIOTOMY FOR RESECTION OF BRAIN TUMOR WITH STEREOTACTIC;  Surgeon: NudeJovita Gamma;  [facility];  Laterality: N/A;  CRANIOTOMY FOR RESECTION OF BRAIN  TUMOR WITH STEREOTACTIC   Social History:  Social History   Socioeconomic History  . Marital status: Divorced    Spouse name: Not on file  . Number of children: Not on file  . Years of education: Not on file  . Highest education level: Not on file  Occupational History  . Not on file  Social Needs  . Financial resource strain: Not on file  . Food insecurity    Worry: Not on file    Inability: Not on file  . Transportation needs    Medical: No    Non-medical: No  Tobacco Use  . Smoking  status: Never Smoker  . Smokeless tobacco: Never Used  Substance and Sexual Activity  . Alcohol use: No    Frequency: Never  . Drug use: No  . Sexual activity: Not Currently  Lifestyle  . Physical activity    Days per week: 5 days    Minutes per session: Not on file  . Stress: Only a little  Relationships  . Social Herbalist on phone: Not on file    Gets together: Not on file    Attends religious service: Not on file    Active member of club or organization: Not on file    Attends meetings of clubs or organizations: Not on file    Relationship status: Not on file  . Intimate partner violence    Fear of current or ex partner: Not on file    Emotionally abused: Not on file    Physically abused: Not on file    Forced sexual activity: Not on file  Other Topics Concern  . Not on file  Social History Narrative  . Not on file   Family History:  Family History  Problem Relation Age of Onset  . Hypertension Daughter   . Stroke Mother   . Cancer Neg Hx     Review of Systems: Constitutional: Denies fevers, chills or abnormal weight loss Eyes: Denies blurriness of vision Ears, nose, mouth, throat, and face: Denies mucositis or sore throat Respiratory: Denies cough, dyspnea or wheezes Cardiovascular: Denies palpitation, chest discomfort or lower extremity swelling Gastrointestinal:  Denies nausea, constipation, diarrhea GU: Denies dysuria or incontinence Skin: Denies abnormal skin rashes Neurological: Per HPI Musculoskeletal: Denies joint pain, back or neck discomfort. No decrease in ROM Behavioral/Psych: Denies anxiety, disturbance in thought content, and mood instability  Physical Exam: Vitals:   04/21/19 0952  BP: 125/67  Pulse: 64  Resp: 18  Temp: 98.2 F (36.8 C)  SpO2: 97%   KPS: 80. General: Alert, cooperative, pleasant, in no acute distress Head: Craniotomy scar noted, dry and intact. EENT: No conjunctival injection or scleral icterus. Oral mucosa  moist Lungs: Resp effort normal Cardiac: Regular rate and rhythm Abdomen: Soft, non-distended abdomen Skin: No rashes cyanosis or petechiae. Extremities: No clubbing or edema  Neurologic Exam: Mental Status: Awake, alert, attentive to examiner. Oriented to self and environment. Language is notably impaired in regards to fluency with impaired comprehension to two step commands.  Cranial Nerves: Visual acuity is grossly normal. Visual fields are full. Extra-ocular movements intact. No ptosis. Face is symmetric, tongue midline. Motor: Tone and bulk are normal. Right arm and leg 4/5, left side 5/5 power. Reflexes are symmetric, no pathologic reflexes present. Intact finger to nose bilaterally Sensory: Intact to light touch and temperature Gait: Independent but purposeful and wide based   Labs: I have reviewed the data as listed    Component Value  Date/Time   NA 136 02/16/2019 1226   NA 136 (A) 10/18/2017   K 4.3 02/16/2019 1226   CL 99 02/16/2019 1226   CO2 26 02/16/2019 1226   GLUCOSE 121 (H) 02/16/2019 1226   BUN 18 02/16/2019 1226   BUN 19 10/18/2017   CREATININE 0.88 02/16/2019 1226   CREATININE 0.95 10/08/2018 1137   CALCIUM 9.5 02/16/2019 1226   PROT 5.9 (L) 02/06/2019 1605   ALBUMIN 3.8 02/06/2019 1605   AST 14 (L) 02/06/2019 1605   AST 13 (L) 10/08/2018 1137   ALT 11 02/06/2019 1605   ALT 11 10/08/2018 1137   ALKPHOS 60 02/06/2019 1605   BILITOT 1.1 02/06/2019 1605   BILITOT 1.2 10/08/2018 1137   GFRNONAA >60 02/16/2019 1226   GFRNONAA >60 10/08/2018 1137   GFRAA >60 02/16/2019 1226   GFRAA >60 10/08/2018 1137   Lab Results  Component Value Date   WBC 6.4 02/20/2019   NEUTROABS 2.9 02/06/2019   HGB 13.5 02/20/2019   HCT 40.2 02/20/2019   MCV 89.3 02/20/2019   PLT 220 02/20/2019    Assessment/Plan 1. Glioblastoma ([place])  2. Seizure [employer])  Mr. Mortellaro is clinically stable today.    We recommended initiating treatment with cycle #10 Temozolomide 150 mg/m2,  on for five days and off for twenty three days in twenty eight day cycles. The patient will have a complete blood count performed on days 21 and 28 of each cycle, and a comprehensive metabolic panel performed on day 28 of each cycle. Labs may need to be performed more often. Zofran will prescribed for home use for nausea/vomiting.   Chemotherapy should be held for the following:  ANC less than 1,000  Platelets less than 100,000  LFT or creatinine greater than 2x ULN  If clinical concerns/contraindications develop  He should continue Keppra 1577m BID, Vimpat 560mBID and Eliquis.  Decadron will be restarted at 82m68maily.  We recommend he return in 1 months with MRI brain for evaluation, or sooner as needed.  All questions were answered. The patient knows to call the clinic with any problems, questions or concerns. No barriers to learning were detected.  The total time spent in the encounter was 25 minutes and more than 50% was on counseling and review of test results   ZacVentura SellersD Medical Director of Neuro-Oncology [facility] [place]/06/20 9:37 AM

## 2019-04-21 NOTE — Telephone Encounter (Signed)
Oral Oncology Patient Advocate Encounter  The patient was using [employer] patient assistance to get his Temodar filled at $0 out of pocket cost to him. This enrollment expired 03/14/19.  [employer] no longer provides assistance for [employer].  The patient does have insurance now. However, his copay for 140mg  is $143.41 an20mg  is $20.75.  There is one grant open with the [place] brain tumor foundation but they are almost out of money. I called Christopher Morris, the patients daughter on 2 different numbers and left a detailed voicemail.  Oral Oncology clinic will continue to follow.  [place] Patient [place] Phone [phone] Fax [phone] 04/21/2019   1:31 PM

## 2019-04-21 NOTE — Telephone Encounter (Signed)
Oral Oncology Patient Advocate Encounter  The patients daughter, [name] returned my call.   The Temodar copays are not affordable for the patient.  We talked about the brain tumor foundation and she would appreciate the help. [name] will get his 2020 SSI award letter and send it to me tomorrow.   This encounter will be updated until final determination.  [place] Patient [place] Phone [phone] Fax [phone] 04/21/2019   4:29 PM

## 2019-04-22 LAB — TESTOSTERONE: Testosterone: 82 ng/dL — ABNORMAL LOW (ref 264–916)

## 2019-04-23 NOTE — Telephone Encounter (Signed)
Oral Oncology Patient Advocate Encounter  Christopher Morris emailed me Christopher Morris income information today and I faxed the completed application today, [id number].  This encounter will be updated until final determination.  [place] Patient [place] Phone [phone] Fax [phone] 04/23/2019   1:41 PM

## 2019-04-24 NOTE — Telephone Encounter (Signed)
Oral Oncology Patient Advocate Encounter  I called the [place] Brain tumor foundation to follow up on the status of the grant and had to leave a message.  [place] Patient [place] Phone [phone] Fax [phone] 04/24/2019   3:18 PM

## 2019-04-27 ENCOUNTER — Other Ambulatory Visit: Payer: Self-pay | Admitting: Pharmacist

## 2019-04-27 ENCOUNTER — Telehealth: Payer: Self-pay | Admitting: Pharmacist

## 2019-04-27 DIAGNOSIS — C719 Malignant neoplasm of brain, unspecified: Secondary | ICD-10-CM

## 2019-04-27 MED ORDER — ONDANSETRON HCL 8 MG PO TABS: 8.0000 mg | ORAL_TABLET | Freq: Two times a day (BID) | ORAL | 1 refills | Status: AC | PRN

## 2019-04-27 NOTE — Telephone Encounter (Signed)
Oral Oncology Patient Advocate Encounter  [place] brain tumor foundation emailed me requesting a front and back copy of the patients insurance cards.  I faxed those today, 10/12.  This encounter will be updated until final determination.  [place] Patient [place] Phone [phone] Fax [phone] 04/27/2019   8:36 AM

## 2019-04-27 NOTE — Telephone Encounter (Signed)
Oral Oncology Patient Advocate Encounter  [place] brain tumor foundation grant has been approved for $5000 to cover the cost for Temodar at [facility].  Approval dates: 01/25/19-01/25/20  The cost will be put on an A/R account at the pharmacy when the temodar is dispensed. I will submit a claim form with the receipts to the foundation and they will mail a check to [facility].  I called [name] today and left a voicemail on both numbers listed. If I am able to talk to her today I will be able to get the medicine in their hands tomorrow or Wednesday.  [place] Patient [place] Phone [phone] Fax [phone] 04/27/2019   9:45 AM

## 2019-04-27 NOTE — Telephone Encounter (Signed)
Oral Chemotherapy Pharmacist Encounter   I spoke with patient's daughter, [name] for overview of restart of adjuvant Temodar (temozolomide) for the maintenance treatment of glioblastoma multiforme, planned duration 6-12 months of treatment.  Patient was originally started on adjuvant temozolomide in June 2019 - April 2020 (completed 9 cycles) Patient's status has declined over the past couple of months and adjuvant temozolomide will now be restarted at ~150 mg/m2 (320 mg) by mouth once daily for 5 days on, 23 days off, repeated every 28 days. Patient will be starting cycle 10 of adjuvant temozolomide  Counseled on administration, dosing, side effects, monitoring, drug-food interactions, safe handling, storage, and disposal.  Patient will take Temodar 140mg  capsules x 2 and Temodar 20 mg capsules x 2, 320mg  total daily dose, by mouth once daily, may take at bedtime and on an empty stomach to decrease nausea and vomiting.   Patient will take Temodar daily for 5 days on, 23 days off, and repeated.  Temodar re-start date: 04/28/19   Patient will take Zofran 8mg  tablet, 1 tablet by mouth 30-60 min prior to Temodar dose to help decrease N/V. New prescription for ondansetron was e-scribed to the [facility] per [facility] request.   Adverse effects include but are not limited to: nausea, vomiting, anorexia, GI upset, rash, drug fever, and fatigue.  PCP prophylaxis will not be initiated at this time, but may be added based on lymphocyte count in the future.  Reviewed importance of keeping a medication schedule and plan for any missed doses.  Medication reconciliation performed and medication/allergy list updated.  Insurance authorization for Temodar was not required. Test claim at the pharmacy revealed copayment $164.16 for next fill of temozolomide capsules. This is not affordable to patient. Manufacturer no longer supports a compassionate use program. Oral oncology patient  advocate was successful in securing copayment grant from the [place] to cover out of pocket expenses for temozolomide at the pharmacy. [name] will pick this up from the [employer] on 10/13.  [name] informed the pharmacy will reach out 5-7 days prior to needing next fill of Temodar to coordinate continued medication acquisition to prevent break in therapy.  All questions answered.  [name] voiced understanding and appreciation.   They know to call the office with questions or concerns.  [name], PharmD, BCPS, BCOP  04/27/2019   1:55 PM Oral Oncology Clinic [phone]

## 2019-04-28 ENCOUNTER — Telehealth: Payer: Self-pay

## 2019-04-28 MED FILL — TEMOZOLOMIDE 140 MG CAPS: 140 | 28 days supply | Qty: 10 | Fill #0

## 2019-04-28 MED FILL — ONDANSETRON HCL 8 MG TABLET: 8 | 15 days supply | Qty: 30 | Fill #0

## 2019-04-28 MED FILL — TEMOZOLOMIDE 20 MG CAPS: 20 | 28 days supply | Qty: 10 | Fill #0

## 2019-04-28 NOTE — Telephone Encounter (Signed)
Oral Oncology Patient Advocate Encounter  Prior Authorization for Ondansetron has been approved.    PA# [id number] Effective dates: 04/28/19 through 07/15/20  Oral Oncology Clinic will continue to follow.   [place] Patient [place] Phone [phone] Fax [phone] 04/28/2019    3:49 PM

## 2019-04-28 NOTE — Telephone Encounter (Signed)
Oral Oncology Patient Advocate Encounter  Received notification from [employer] that prior authorization for Ondansetron is required.  PA submitted on CoverMyMeds Key [id number] Status is pending  Oral Oncology Clinic will continue to follow.  Christopher Morris Patient Christopher Morris Phone [phone] Fax [phone] 04/28/2019    2:56 PM

## 2019-04-29 NOTE — Telephone Encounter (Signed)
Oral Oncology Patient Advocate Encounter  Confirmed with [place] that Temodar was picked up on 04/28/19. Copays have been billed to [facility] brain tumor foundation.   [place] Patient [place] Phone [phone] Fax [phone] 04/29/2019   11:35 AM

## 2019-04-29 NOTE — Telephone Encounter (Signed)
Oral Oncology Patient Advocate Encounter  I submitted a claim form to the [place] Brain tumor foundation today for a total of $164.16. The foundation will mail a check to [employer].  I included the claim form and the receipt from [facility].  I received a successful fax notification on 04/29/19.  [place] Patient [place] Phone [phone] Fax [phone] 04/29/2019   11:31 AM

## 2019-05-22 ENCOUNTER — Other Ambulatory Visit: Payer: Self-pay

## 2019-05-22 ENCOUNTER — Ambulatory Visit (HOSPITAL_COMMUNITY)
Admission: RE | Admit: 2019-05-22 | Discharge: 2019-05-22 | Disposition: A | Discharge status: Home / Self Care | Payer: Medicare Other | Source: Ambulatory Visit | Attending: Internal Medicine | Admitting: Internal Medicine

## 2019-05-22 DIAGNOSIS — C711 Malignant neoplasm of frontal lobe: Secondary | ICD-10-CM

## 2019-05-22 MED ORDER — GADOBUTROL 1 MMOL/ML IV SOLN
10.0000 mL | Freq: Once | INTRAVENOUS | Status: AC | PRN
  Administered 2019-05-22: 14:00:00 10 mL via INTRAVENOUS

## 2019-05-25 ENCOUNTER — Other Ambulatory Visit: Payer: Self-pay

## 2019-05-25 ENCOUNTER — Inpatient Hospital Stay (HOSPITAL_BASED_OUTPATIENT_CLINIC_OR_DEPARTMENT_OTHER): Payer: Medicare Other | Admitting: Internal Medicine

## 2019-05-25 ENCOUNTER — Inpatient Hospital Stay: Payer: Medicare Other | Attending: Internal Medicine

## 2019-05-25 ENCOUNTER — Other Ambulatory Visit: Payer: Self-pay | Admitting: Internal Medicine

## 2019-05-25 VITALS — BP 133/68 | HR 79 | Temp 98.3°F | Resp 18 | Ht 76.0 in | Wt 203.1 lb

## 2019-05-25 DIAGNOSIS — C711 Malignant neoplasm of frontal lobe: Secondary | ICD-10-CM | POA: Insufficient documentation

## 2019-05-25 DIAGNOSIS — C719 Malignant neoplasm of brain, unspecified: Secondary | ICD-10-CM

## 2019-05-25 DIAGNOSIS — R569 Unspecified convulsions: Secondary | ICD-10-CM | POA: Diagnosis not present

## 2019-05-25 LAB — CMP (CANCER CENTER ONLY)
ALT: 12 U/L (ref 0–44)
AST: 10 U/L — ABNORMAL LOW (ref 15–41)
Albumin: 3.6 g/dL (ref 3.5–5.0)
Alkaline Phosphatase: 65 U/L (ref 38–126)
Anion gap: 9 (ref 5–15)
BUN: 16 mg/dL (ref 8–23)
CO2: 28 mmol/L (ref 22–32)
Calcium: 8.7 mg/dL — ABNORMAL LOW (ref 8.9–10.3)
Chloride: 105 mmol/L (ref 98–111)
Creatinine: 0.93 mg/dL (ref 0.61–1.24)
GFR, Est AFR Am: >60 mL/min (ref >60)
GFR, Estimated: >60 mL/min (ref >60)
Glucose, Bld: 111 mg/dL — ABNORMAL HIGH (ref 70–99)
Potassium: 3.6 mmol/L (ref 3.5–5.1)
Sodium: 142 mmol/L (ref 135–145)
Total Bilirubin: 1 mg/dL (ref 0.3–1.2)
Total Protein: 6.2 g/dL — ABNORMAL LOW (ref 6.5–8.1)

## 2019-05-25 LAB — CBC WITH DIFFERENTIAL (CANCER CENTER ONLY)
Abs Immature Granulocytes: 0.06 10*3/uL (ref 0.00–0.07)
Basophils Absolute: 0 10*3/uL (ref 0.0–0.1)
Basophils Relative: 1 %
Eosinophils Absolute: 0.1 10*3/uL (ref 0.0–0.5)
Eosinophils Relative: 1 %
HCT: 39.5 % (ref 39.0–52.0)
Hemoglobin: 13.2 g/dL (ref 13.0–17.0)
Immature Granulocytes: 1 %
Lymphocytes Relative: 34 %
Lymphs Abs: 2.7 10*3/uL (ref 0.7–4.0)
MCH: 30.3 pg (ref 26.0–34.0)
MCHC: 33.4 g/dL (ref 30.0–36.0)
MCV: 90.8 fL (ref 80.0–100.0)
Monocytes Absolute: 0.5 10*3/uL (ref 0.1–1.0)
Monocytes Relative: 6 %
Neutro Abs: 4.4 10*3/uL (ref 1.7–7.7)
Neutrophils Relative %: 57 %
Platelet Count: 154 10*3/uL (ref 150–400)
RBC: 4.35 MIL/uL (ref 4.22–5.81)
RDW: 13.2 % (ref 11.5–15.5)
WBC Count: 7.8 10*3/uL (ref 4.0–10.5)
nRBC: 0 % (ref 0.0–0.2)

## 2019-05-25 MED ORDER — TEMOZOLOMIDE 20 MG PO CAPS: 40.0000 mg | ORAL_CAPSULE | Freq: Every day | ORAL | 0 refills | Status: DC

## 2019-05-25 MED ORDER — TEMOZOLOMIDE 140 MG PO CAPS: 280.0000 mg | ORAL_CAPSULE | Freq: Every day | ORAL | 0 refills | Status: DC

## 2019-05-25 MED FILL — TEMOZOLOMIDE 140 MG CAPS: 140 | 28 days supply | Qty: 10 | Fill #0

## 2019-05-25 MED FILL — TEMOZOLOMIDE 20 MG CAPS: 20 | 28 days supply | Qty: 10 | Fill #0

## 2019-05-25 NOTE — Progress Notes (Signed)
[facility] [place], [place] [address] [phone]   Interval Evaluation  Date of Service: 05/25/19 Patient Name: Christopher Morris Patient MRN: [id number] Patient DOB: 05-Mar-1941 Provider: Ventura Sellers, MD  Identifying Statement:  Christopher Morris is a 78 y.o. male with left frontal glioblastoma    Oncologic History: Oncology History  Frontal glioblastoma multiforme ([place])  09/30/2017 Surgery   Craniotomy, resection by Dr. Sherwood Gambler.  Path demonstrates glioblastoma   10/28/2017 -  Radiation Therapy   IMRT with concurrent Temodar    - 11/17/2018 Chemotherapy   The patient completed 9th cycle of 5-day Temozolomide, transitioned to observation due to cytopenia and stable disease      Biomarkers:  MGMT Methylated.  IDH 1/2 Unknown.  EGFR Amplified  EGFRvIII positive   Interval History:  Christopher Morris presents today for follow up after cycle #10 of temodar.  He and his daughter describe relative stability over the past month.  Still with little functional use of right hand, requires help with most ADLs.  Continues to walk with his walker or some form of assistance.  Has been off decadron for past 9 days without complication. Otherwise denies additional seizures since discharge, headaches.    Medications: Current Outpatient Medications on File Prior to Visit  Medication Sig Dispense Refill   apixaban (ELIQUIS) 5 MG TABS tablet Take 1 tablet (5 mg total) by mouth 2 (two) times daily. 60 tablet 0   lacosamide (VIMPAT) 50 MG TABS tablet Take 1 tablet (50 mg total) by mouth 2 (two) times daily. 60 tablet 3   levETIRAcetam (KEPPRA) 250 MG tablet Take 6 tablets (1,500 mg total) by mouth 2 (two) times daily.     Multiple Vitamin (MULTIVITAMIN) tablet Take 1 tablet by mouth daily.     dexamethasone (DECADRON) 2 MG tablet Take 1 tablet (2 mg total) by mouth daily. (Patient not taking: Reported on 05/25/2019) 30 tablet 1    ondansetron (ZOFRAN) 8 MG tablet Take 1 tablet (8 mg total) by mouth 2 (two) times daily as needed. Start on the third day after chemotherapy. (Patient not taking: Reported on 05/25/2019) 30 tablet 1   temozolomide (TEMODAR) 140 MG capsule Take 2 capsules (280 mg total) by mouth daily. May take on an empty stomach to decrease nausea & vomiting. (Patient not taking: Reported on 05/25/2019) 10 capsule 0   temozolomide (TEMODAR) 20 MG capsule Take 2 capsules (40 mg total) by mouth daily. May take on an empty stomach to decrease nausea & vomiting. (Patient not taking: Reported on 05/25/2019) 10 capsule 0   vitamin B-12 (CYANOCOBALAMIN) 500 MCG tablet Take 500 mcg by mouth daily.     No current facility-administered medications on file prior to visit.     Allergies:  Allergies  Allergen Reactions   Pork-Derived Products     Patient does not eat pork   Shellfish Allergy     Patient does not eat shellfish   Past Medical History:  Past Medical History:  Diagnosis Date   Brain cancer ([place])    Glioblastoma ([place]) 09/25/2017   Hyperglycemia 09/25/2017   Seizure ([place]) 09/25/2017   Past Surgical History:  Past Surgical History:  Procedure Laterality Date   APPLICATION OF CRANIAL NAVIGATION N/A 09/30/2017   Procedure: APPLICATION OF CRANIAL NAVIGATION;  Surgeon: [name], MD;  Location: [facility];  Service: Neurosurgery;  Laterality: N/A;   CRANIOTOMY N/A 09/30/2017   Procedure: CRANIOTOMY FOR RESECTION OF BRAIN TUMOR WITH STEREOTACTIC;  Surgeon: [name], MD;  Location: [facility];  Service: Neurosurgery;  Laterality: N/A;  CRANIOTOMY FOR RESECTION OF BRAIN TUMOR WITH STEREOTACTIC   Social History:  Social History   Socioeconomic History   Marital status: Divorced    Spouse name: Not on file   Number of children: Not on file   Years of education: Not on file   Highest education level: Not on file  Occupational History   Not on file  Social Needs   Financial resource  strain: Not on file   Food insecurity    Worry: Not on file    Inability: Not on file   Transportation needs    Medical: No    Non-medical: No  Tobacco Use   Smoking status: Never Smoker   Smokeless tobacco: Never Used  Substance and Sexual Activity   Alcohol use: No    Frequency: Never   Drug use: No   Sexual activity: Not Currently  Lifestyle   Physical activity    Days per week: 5 days    Minutes per session: Not on file   Stress: Only a little  Relationships   Press photographer on phone: Not on file    Gets together: Not on file    Attends religious service: Not on file    Active member of club or organization: Not on file    Attends meetings of clubs or organizations: Not on file    Relationship status: Not on file   Intimate partner violence    Fear of current or ex partner: Not on file    Emotionally abused: Not on file    Physically abused: Not on file    Forced sexual activity: Not on file  Other Topics Concern   Not on file  Social History Narrative   Not on file   Family History:  Family History  Problem Relation Age of Onset   Hypertension Daughter    Stroke Mother    Cancer Neg Hx     Review of Systems: Constitutional: Denies fevers, chills or abnormal weight loss Eyes: Denies blurriness of vision Ears, nose, mouth, throat, and face: Denies mucositis or sore throat Respiratory: Denies cough, dyspnea or wheezes Cardiovascular: Denies palpitation, chest discomfort or lower extremity swelling Gastrointestinal:  Denies nausea, constipation, diarrhea GU: Denies dysuria or incontinence Skin: Denies abnormal skin rashes Neurological: Per HPI Musculoskeletal: Denies joint pain, back or neck discomfort. No decrease in ROM Behavioral/Psych: Denies anxiety, disturbance in thought content, and mood instability  Physical Exam: Vitals:   05/25/19 1231  BP: 133/68  Pulse: 79  Resp: 18  Temp: 98.3 F (36.8 C)  SpO2: 96%    KPS: 80. General: Alert, cooperative, pleasant, in no acute distress Head: Craniotomy scar noted, dry and intact. EENT: No conjunctival injection or scleral icterus. Oral mucosa moist Lungs: Resp effort normal Cardiac: Regular rate and rhythm Abdomen: Soft, non-distended abdomen Skin: No rashes cyanosis or petechiae. Extremities: No clubbing or edema  Neurologic Exam: Mental Status: Awake, alert, attentive to examiner. Oriented to self and environment. Language is notably impaired in regards to fluency with impaired comprehension to two step commands.  Cranial Nerves: Visual acuity is grossly normal. Visual fields are full. Extra-ocular movements intact. No ptosis. Face is symmetric, tongue midline. Motor: Tone and bulk are normal. Right arm and leg 4/5, left side 5/5 power. Reflexes are symmetric, no pathologic reflexes present. Intact finger to nose bilaterally Sensory: Intact to light touch and temperature Gait: Independent  but purposeful and wide based   Labs: I have reviewed the data as listed    Component Value Date/Time   NA 142 05/25/2019 1210   NA 136 (A) 10/18/2017   K 3.6 05/25/2019 1210   CL 105 05/25/2019 1210   CO2 28 05/25/2019 1210   GLUCOSE 111 (H) 05/25/2019 1210   BUN 16 05/25/2019 1210   BUN 19 10/18/2017   CREATININE 0.93 05/25/2019 1210   CALCIUM 8.7 (L) 05/25/2019 1210   PROT 6.2 (L) 05/25/2019 1210   ALBUMIN 3.6 05/25/2019 1210   AST 10 (L) 05/25/2019 1210   ALT 12 05/25/2019 1210   ALKPHOS 65 05/25/2019 1210   BILITOT 1.0 05/25/2019 1210   GFRNONAA >60 05/25/2019 1210   GFRAA >60 05/25/2019 1210   Lab Results  Component Value Date   WBC 7.8 05/25/2019   NEUTROABS 4.4 05/25/2019   HGB 13.2 05/25/2019   HCT 39.5 05/25/2019   MCV 90.8 05/25/2019   PLT 154 05/25/2019   Imaging:  [facility] Clinician Interpretation: I have personally reviewed the CNS images as listed.  My interpretation, in the context of the patient's clinical presentation, is  stable disease  [name] Wo Contrast  Result Date: 05/22/2019 CLINICAL DATA:  78 year old male with glioblastoma treated with resection and IMRT in 2019. Restaging. EXAM: MRI HEAD WITHOUT AND WITH CONTRAST TECHNIQUE: Multiplanar, multiecho pulse sequences of the brain and surrounding structures were obtained without and with intravenous contrast. CONTRAST:  40m GADAVIST GADOBUTROL 1 MMOL/ML IV SOLN COMPARISON:  03/20/2019 and earlier. FINDINGS: Brain: Decreased thickness of nodular resection cavity enhancement in the anterior and superior left frontal lobe compared to 03/20/2019. And the overall size of the enhancing lesion has not significantly changed (35 by 28 x 32 millimeters - AP by transverse by CC - versus 34 by 27 by 35 millimeters in September). As before the lesion and enhancement is inseparable from the left frontal horn (series 12, image 16 today), and abnormal enhancement continues into the body of the corpus callosum in the midline (series 11, image 88). However, no ependymal enhancement extending from that area. Widespread abnormal T2 and FLAIR hyperintense signal in both hemispheres left greater than right persists, but has regressed some in the left parietal lobe (series 6, image 18). T2 and FLAIR hyperintensity has also substantially regressed in the region of the left deep gray nuclei (series 10, image 14 today versus same series and image in September). Left lateral ventricle patency has also mildly improved. No additional enhancement identified. On diffusion a small area of restriction along the posterior margin of the cavity has not significantly changed (series 4, image 38). Stable mild left superior convexity hemosiderin, and small postoperative extra-axial collection underlying the craniotomy. No intracranial mass effect. No superimposed restricted diffusion suggestive of acute infarction. No ventriculomegaly, or acute intracranial hemorrhage. Cervicomedullary junction and pituitary  are within normal limits. Vascular: Major intracranial vascular flow voids are stable. The major dural venous sinuses are enhancing and appear to be patent. Skull and upper cervical spine: Stable cervical spine degeneration. Stable visible osseous structures. Sinuses/Orbits: Negative orbits. Stable and well pneumatized paranasal sinuses and mastoids. Other: Visible internal auditory structures appear normal. Scalp and face soft tissues are stable. IMPRESSION: 1. Regression of tumor since September, including mildly decreased thickness enhancement about the resection cavity, and decreased T2/FLAIR in the left deep gray nuclei. 2. No new intracranial abnormality. Electronically Signed   By: HGenevie AnnM.D.   On: 05/22/2019 20:05  Assessment/Plan 1. Glioblastoma ([place])  2. Seizure [facility])  Mr. Anastacio is clinically and radiographically stable today.  Response to decadron implies inflammatory etiology as driver of tumorigenic changes on prior MRI.    We recommended initiating treatment with cycle #11 Temozolomide 150 mg/m2, on for five days and off for twenty three days in twenty eight day cycles. The patient will have a complete blood count performed on days 21 and 28 of each cycle, and a comprehensive metabolic panel performed on day 28 of each cycle. Labs may need to be performed more often. Zofran will prescribed for home use for nausea/vomiting.   Chemotherapy should be held for the following:  ANC less than 1,000  Platelets less than 100,000  LFT or creatinine greater than 2x ULN  If clinical concerns/contraindications develop  He should continue Keppra '1500mg'$  BID, Vimpat '50mg'$  BID and Eliquis.  Decadron will continue to hold if no new symptoms.  We recommend he return in 1 months labs for evaluation, or sooner as needed.  All questions were answered. The patient knows to call the clinic with any problems, questions or concerns. No barriers to learning were detected.  The total time spent in  the encounter was 40 minutes and more than 50% was on counseling and review of test results   Ventura Sellers, MD Medical Director of Neuro-Oncology [facility] at [place] 05/25/19 1:03 PM

## 2019-05-26 ENCOUNTER — Telehealth: Payer: Self-pay | Admitting: Internal Medicine

## 2019-05-26 NOTE — Telephone Encounter (Signed)
Scheduled appt per 11/9 los.  Left a VM of the appt date and time.

## 2019-05-27 ENCOUNTER — Emergency Department (HOSPITAL_COMMUNITY): Payer: No Typology Code available for payment source

## 2019-05-27 ENCOUNTER — Emergency Department (HOSPITAL_COMMUNITY)
Admission: EM | Admit: 2019-05-27 | Discharge: 2019-05-27 | Disposition: A | Discharge status: Home / Self Care | Payer: No Typology Code available for payment source | Attending: Emergency Medicine | Admitting: Emergency Medicine

## 2019-05-27 ENCOUNTER — Other Ambulatory Visit: Payer: Self-pay

## 2019-05-27 ENCOUNTER — Telehealth: Payer: Self-pay | Admitting: *Deleted

## 2019-05-27 ENCOUNTER — Encounter (HOSPITAL_COMMUNITY): Payer: Self-pay | Admitting: Emergency Medicine

## 2019-05-27 DIAGNOSIS — R296 Repeated falls: Secondary | ICD-10-CM | POA: Diagnosis not present

## 2019-05-27 DIAGNOSIS — R531 Weakness: Secondary | ICD-10-CM | POA: Diagnosis not present

## 2019-05-27 DIAGNOSIS — W19XXXA Unspecified fall, initial encounter: Secondary | ICD-10-CM

## 2019-05-27 DIAGNOSIS — Y9389 Activity, other specified: Secondary | ICD-10-CM | POA: Insufficient documentation

## 2019-05-27 DIAGNOSIS — Z7901 Long term (current) use of anticoagulants: Secondary | ICD-10-CM | POA: Insufficient documentation

## 2019-05-27 DIAGNOSIS — Z85841 Personal history of malignant neoplasm of brain: Secondary | ICD-10-CM | POA: Insufficient documentation

## 2019-05-27 DIAGNOSIS — Y92013 Bedroom of single-family (private) house as the place of occurrence of the external cause: Secondary | ICD-10-CM | POA: Diagnosis not present

## 2019-05-27 DIAGNOSIS — W06XXXA Fall from bed, initial encounter: Secondary | ICD-10-CM | POA: Diagnosis not present

## 2019-05-27 DIAGNOSIS — Z20828 Contact with and (suspected) exposure to other viral communicable diseases: Secondary | ICD-10-CM | POA: Diagnosis not present

## 2019-05-27 DIAGNOSIS — Z79899 Other long term (current) drug therapy: Secondary | ICD-10-CM | POA: Diagnosis not present

## 2019-05-27 DIAGNOSIS — C719 Malignant neoplasm of brain, unspecified: Secondary | ICD-10-CM

## 2019-05-27 DIAGNOSIS — Y999 Unspecified external cause status: Secondary | ICD-10-CM | POA: Insufficient documentation

## 2019-05-27 DIAGNOSIS — Z86711 Personal history of pulmonary embolism: Secondary | ICD-10-CM | POA: Insufficient documentation

## 2019-05-27 DIAGNOSIS — G9341 Metabolic encephalopathy: Secondary | ICD-10-CM | POA: Diagnosis not present

## 2019-05-27 DIAGNOSIS — R262 Difficulty in walking, not elsewhere classified: Secondary | ICD-10-CM

## 2019-05-27 LAB — CBC WITH DIFFERENTIAL/PLATELET
Abs Immature Granulocytes: 0.06 10*3/uL (ref 0.00–0.07)
Basophils Absolute: 0 10*3/uL (ref 0.0–0.1)
Basophils Relative: 0 %
Eosinophils Absolute: 0.1 10*3/uL (ref 0.0–0.5)
Eosinophils Relative: 1 %
HCT: 37.8 % — ABNORMAL LOW (ref 39.0–52.0)
Hemoglobin: 12.7 g/dL — ABNORMAL LOW (ref 13.0–17.0)
Immature Granulocytes: 1 %
Lymphocytes Relative: 11 %
Lymphs Abs: 0.9 10*3/uL (ref 0.7–4.0)
MCH: 30.4 pg (ref 26.0–34.0)
MCHC: 33.6 g/dL (ref 30.0–36.0)
MCV: 90.4 fL (ref 80.0–100.0)
Monocytes Absolute: 0.6 10*3/uL (ref 0.1–1.0)
Monocytes Relative: 7 %
Neutro Abs: 6.8 10*3/uL (ref 1.7–7.7)
Neutrophils Relative %: 80 %
Platelets: 128 10*3/uL — ABNORMAL LOW (ref 150–400)
RBC: 4.18 MIL/uL — ABNORMAL LOW (ref 4.22–5.81)
RDW: 13 % (ref 11.5–15.5)
WBC: 8.4 10*3/uL (ref 4.0–10.5)
nRBC: 0 % (ref 0.0–0.2)

## 2019-05-27 LAB — COMPREHENSIVE METABOLIC PANEL
ALT: 15 U/L (ref 0–44)
AST: 15 U/L (ref 15–41)
Albumin: 3.5 g/dL (ref 3.5–5.0)
Alkaline Phosphatase: 58 U/L (ref 38–126)
Anion gap: 11 (ref 5–15)
BUN: 14 mg/dL (ref 8–23)
CO2: 23 mmol/L (ref 22–32)
Calcium: 8.8 mg/dL — ABNORMAL LOW (ref 8.9–10.3)
Chloride: 104 mmol/L (ref 98–111)
Creatinine, Ser: 1.09 mg/dL (ref 0.61–1.24)
GFR calc Af Amer: >60 mL/min (ref >60)
GFR calc non Af Amer: >60 mL/min (ref >60)
Glucose, Bld: 101 mg/dL — ABNORMAL HIGH (ref 70–99)
Potassium: 3.8 mmol/L (ref 3.5–5.1)
Sodium: 138 mmol/L (ref 135–145)
Total Bilirubin: 1.1 mg/dL (ref 0.3–1.2)
Total Protein: 5.9 g/dL — ABNORMAL LOW (ref 6.5–8.1)

## 2019-05-27 LAB — URINALYSIS, ROUTINE W REFLEX MICROSCOPIC
Bilirubin Urine: NEGATIVE
Glucose, UA: NEGATIVE mg/dL
Hgb urine dipstick: NEGATIVE
Ketones, ur: NEGATIVE mg/dL
Leukocytes,Ua: NEGATIVE
Nitrite: NEGATIVE
Protein, ur: NEGATIVE mg/dL
Specific Gravity, Urine: 1.011 (ref 1.005–1.030)
pH: 6 (ref 5.0–8.0)

## 2019-05-27 LAB — SARS CORONAVIRUS 2 (TAT 6-24 HRS): SARS Coronavirus 2: NEGATIVE

## 2019-05-27 LAB — CBG MONITORING, ED: Glucose-Capillary: 82 mg/dL (ref 70–99)

## 2019-05-27 LAB — PROTIME-INR
INR: 1.2 (ref 0.8–1.2)
Prothrombin Time: 14.8 seconds (ref 11.4–15.2)

## 2019-05-27 MED ORDER — LEVETIRACETAM 500 MG PO TABS
1500.0000 mg | ORAL_TABLET | Freq: Two times a day (BID) | ORAL | Status: DC
  Administered 2019-05-27: 17:00:00 1500 mg via ORAL
  Filled 2019-05-27: qty 3

## 2019-05-27 MED ORDER — APIXABAN 5 MG PO TABS
5.0000 mg | ORAL_TABLET | Freq: Two times a day (BID) | ORAL | Status: DC
  Administered 2019-05-27: 17:00:00 5 mg via ORAL
  Filled 2019-05-27 (×3): qty 1

## 2019-05-27 MED ORDER — TEMOZOLOMIDE 20 MG PO CAPS: 40.0000 mg | ORAL_CAPSULE | Freq: Every day | ORAL | Status: DC

## 2019-05-27 MED ORDER — TEMOZOLOMIDE 20 MG PO CAPS
320.0000 mg | ORAL_CAPSULE | Freq: Every day | ORAL | Status: DC
  Filled 2019-05-27: qty 1

## 2019-05-27 MED ORDER — DEXAMETHASONE 4 MG PO TABS
4.0000 mg | ORAL_TABLET | Freq: Every day | ORAL | Status: DC
  Administered 2019-05-27: 4 mg via ORAL
  Filled 2019-05-27: qty 1

## 2019-05-27 MED ORDER — CYANOCOBALAMIN 500 MCG PO TABS
500.0000 ug | ORAL_TABLET | Freq: Every day | ORAL | Status: DC
  Filled 2019-05-27: qty 1

## 2019-05-27 MED ORDER — LACOSAMIDE 50 MG PO TABS
50.0000 mg | ORAL_TABLET | Freq: Two times a day (BID) | ORAL | Status: DC
  Administered 2019-05-27: 17:00:00 50 mg via ORAL
  Filled 2019-05-27: qty 1

## 2019-05-27 MED ORDER — TEMOZOLOMIDE 20 MG PO CAPS: 280.0000 mg | ORAL_CAPSULE | Freq: Every day | ORAL | Status: DC

## 2019-05-27 MED ORDER — DEXAMETHASONE 4 MG PO TABS: 4.0000 mg | ORAL_TABLET | Freq: Every day | ORAL | 0 refills | Status: DC

## 2019-05-27 MED ORDER — ONE-DAILY MULTI VITAMINS PO TABS: 1.0000 | ORAL_TABLET | Freq: Every day | ORAL | Status: DC

## 2019-05-27 MED FILL — DEXAMETHASONE 4 MG TABLET: 4 | 30 days supply | Qty: 30 | Fill #0

## 2019-05-27 NOTE — Telephone Encounter (Signed)
Received message from daughter [name] re:  Pt  Christopher Morris this morning, balance is off, and pt cannot walk anymore. Spoke with [name], and was informed that she did not witness the fall.  Stated pt has no balance, still alert and able to recognize family.  Asked if pt complained of pain, [name] stated pt would not tell her if he is in pain. Instructed [name] to take pt  Or have ambulance transport pt to ER for further evaluation.  [name] voiced understanding. [name]'s   Phone    [phone].

## 2019-05-27 NOTE — Care Management (Signed)
ED CM spoke with the patient at the bedside. Patient states he lives with his daughter. He reports someone is with him 24 hours a day. He has a wheelchair and walker at home. He is agreeable to home health and selects [facility] for home health services. He has used [facility] in the past. He provided verbal permission for CM to speak with his daughter, [name].   CM called [name]. She is agreeable to home health and confirms the patient has someone there with him 24 hours a day. She states she has a 7 year old son there. She is able to come to pick up the patient at discharge but states she must go to work at [employer].   ED CM faxed the home health orders to [facility] and received confirmation of receipt.   CM notified [name] the patient is being discharged. She states she is about 10 minutes away and just spoke with the EDP. Informed the home health agency contact information will be on the patient's discharge instructions.

## 2019-05-27 NOTE — ED Provider Notes (Addendum)
Awaiting PT evaluations and social work to determine if patient will go to a rehab facility. Physical Exam  BP (!) 106/59   Pulse 73   Temp 99.1 F (37.3 C) (Oral)   Resp 17   SpO2 100%   Physical Exam Patient is alert and in no distress.  He is answering questions.  He is oriented to time and place. ED Course/Procedures     Procedures  MDM  PT eval has been undertaken.  Will review with social work for final plan.  Patient is alert and in no distress.  He has no complaints.  Social work advises the plan is for the patient to be discharged home now with face-to-face orders for in-home therapy and nursing care.  I have spoken to the patient's daughter on the phone.  We reviewed this plan.  She will come to pick him up.       Charlesetta Shanks, MD 05/27/19 Pauline Aus    Charlesetta Shanks, MD 05/27/19 2002

## 2019-05-27 NOTE — ED Provider Notes (Signed)
[facility] EMERGENCY DEPARTMENT Provider Note   CSN: [id number] Arrival date & time: 05/27/19  1236     History   Chief Complaint Chief Complaint  Patient presents with  . Fall    HPI Christopher Morris is a 78 y.o. male.     Pt presents to the ED today with weakness and falls.  The pt has a hx of a left frontal glioblastoma.  He had a resection and has been on temodar.  The pt has been off decadron since the end of October.  The pt saw Dr. Mickeal Skinner on 11/9 and told him that he has been stable.  The pt has not had any seizures.  He denies h/a.  Today, the pt fell out of bed.  Unclear if there was a loc.  His family heard it and found him immediately.  Per EMS, his MS is at baseline.  The pt denies any pain.  Pt was brought to [facility] because the family wants him admitted to the inpatient rehab facility here.  Pt is on Eliquis for a hx of PE.     Past Medical History:  Diagnosis Date  . Brain cancer ([place])   . Glioblastoma ([place]) 09/25/2017  . Hyperglycemia 09/25/2017  . Seizure ([place]) 09/25/2017    Patient Active Problem List   Diagnosis Date Noted  . Hypoalbuminemia due to protein-calorie malnutrition ([place])   . Metabolic encephalopathy [id number]  . Seizures ([place]) 02/03/2019  . Right sided weakness 02/02/2019  . Acute metabolic encephalopathy [id number]  . Aphasia 12/29/2018  . Pneumonia 12/22/2017  . HCAP (healthcare-associated pneumonia) 12/22/2017  . Pulmonary embolism ([place]) 12/22/2017  . Tachycardia   . Vitamin B12 deficiency 11/14/2017  . Anemia 11/14/2017  . Fever 11/13/2017  . Dehydration 11/13/2017  . Interstitial pneumonia ([place]) 11/11/2017  . Sepsis ([place]) 11/09/2017  . Bacteremia due to Gram-negative bacteria 10/26/2017  . Sepsis due to Escherichia coli (E. coli) ([place]) 10/26/2017  . UTI (urinary tract infection) 10/25/2017  . Seizure ([place]) 09/25/2017  . Frontal glioblastoma multiforme ([place]) 09/25/2017  . Hyperglycemia 09/25/2017  . BLOOD IN STOOL  07/26/2008  . ERECTILE DYSFUNCTION 07/12/2008    Past Surgical History:  Procedure Laterality Date  . APPLICATION OF CRANIAL NAVIGATION N/A 09/30/2017   Procedure: APPLICATION OF CRANIAL NAVIGATION;  Surgeon: [name], MD;  Location: [facility];  Service: Neurosurgery;  Laterality: N/A;  . CRANIOTOMY N/A 09/30/2017   Procedure: CRANIOTOMY FOR RESECTION OF BRAIN TUMOR WITH STEREOTACTIC;  Surgeon: [name], MD;  Location: [facility];  Service: Neurosurgery;  Laterality: N/A;  CRANIOTOMY FOR RESECTION OF BRAIN TUMOR WITH STEREOTACTIC        Home Medications    Prior to Admission medications   Medication Sig Start Date End Date Taking? Authorizing Provider  apixaban (ELIQUIS) 5 MG TABS tablet Take 1 tablet (5 mg total) by mouth 2 (two) times daily. 02/20/19   Love, Ivan Anchors, PA-C  dexamethasone (DECADRON) 4 MG tablet Take 1 tablet (4 mg total) by mouth daily. 05/27/19   Isla Pence, MD  lacosamide (VIMPAT) 50 MG TABS tablet Take 1 tablet (50 mg total) by mouth 2 (two) times daily. 03/24/19   Ventura Sellers, MD  levETIRAcetam (KEPPRA) 250 MG tablet Take 6 tablets (1,500 mg total) by mouth 2 (two) times daily. 02/20/19   Love, Ivan Anchors, PA-C  Multiple Vitamin (MULTIVITAMIN) tablet Take 1 tablet by mouth daily.    [provider]  ondansetron (ZOFRAN) 8 MG tablet Take 1 tablet (  8 mg total) by mouth 2 (two) times daily as needed. Start on the third day after chemotherapy. Patient not taking: Reported on 05/25/2019 04/27/19   Ventura Sellers, MD  temozolomide (TEMODAR) 140 MG capsule Take 2 capsules (280 mg total) by mouth daily. May take on an empty stomach to decrease nausea & vomiting. Patient not taking: Reported on 05/25/2019 04/21/19   Ventura Sellers, MD  temozolomide (TEMODAR) 140 MG capsule Take 2 capsules (280 mg total) by mouth daily. May take on an empty stomach to decrease nausea & vomiting. 05/25/19   Ventura Sellers, MD  temozolomide (TEMODAR) 20 MG capsule Take 2  capsules (40 mg total) by mouth daily. May take on an empty stomach to decrease nausea & vomiting. Patient not taking: Reported on 05/25/2019 04/21/19   Ventura Sellers, MD  temozolomide (TEMODAR) 20 MG capsule Take 2 capsules (40 mg total) by mouth daily. May take on an empty stomach to decrease nausea & vomiting. 05/25/19   Ventura Sellers, MD  vitamin B-12 (CYANOCOBALAMIN) 500 MCG tablet Take 500 mcg by mouth daily.    [provider]    Family History Family History  Problem Relation Age of Onset  . Hypertension Daughter   . Stroke Mother   . Cancer Neg Hx     Social History Social History   Tobacco Use  . Smoking status: Never Smoker  . Smokeless tobacco: Never Used  Substance Use Topics  . Alcohol use: No    Frequency: Never  . Drug use: No     Allergies   Pork-derived products and Shellfish allergy   Review of Systems Review of Systems  Neurological: Positive for weakness.  All other systems reviewed and are negative.    Physical Exam Updated Vital Signs BP 138/72   Pulse 83   Temp 99.1 F (37.3 C) (Oral)   Resp 19   SpO2 93%   Physical Exam Vitals signs and nursing note reviewed.  Constitutional:      Appearance: Normal appearance.  HENT:     Head: Normocephalic and atraumatic.     Right Ear: External ear normal.     Left Ear: External ear normal.     Nose: Nose normal.     Mouth/Throat:     Mouth: Mucous membranes are dry.  Eyes:     Extraocular Movements: Extraocular movements intact.     Conjunctiva/sclera: Conjunctivae normal.     Pupils: Pupils are equal, round, and reactive to light.  Neck:     Musculoskeletal: Normal range of motion and neck supple.  Cardiovascular:     Rate and Rhythm: Normal rate and regular rhythm.     Pulses: Normal pulses.     Heart sounds: Normal heart sounds.  Pulmonary:     Effort: Pulmonary effort is normal.     Breath sounds: Normal breath sounds.  Abdominal:     General: Abdomen is flat. Bowel  sounds are normal.     Palpations: Abdomen is soft.  Musculoskeletal: Normal range of motion.  Skin:    General: Skin is warm.     Capillary Refill: Capillary refill takes less than 2 seconds.  Neurological:     Mental Status: He is alert. Mental status is at baseline.     Comments: Right arm weakness chronic  Psychiatric:        Mood and Affect: Mood normal.        Behavior: Behavior normal.  Thought Content: Thought content normal.        Judgment: Judgment normal.      ED Treatments / Results  Labs (all labs ordered are listed, but only abnormal results are displayed) Labs Reviewed  CBC WITH DIFFERENTIAL/PLATELET - Abnormal; Notable for the following components:      Result Value   RBC 4.18 (*)    Hemoglobin 12.7 (*)    HCT 37.8 (*)    Platelets 128 (*)    All other components within normal limits  COMPREHENSIVE METABOLIC PANEL - Abnormal; Notable for the following components:   Glucose, Bld 101 (*)    Calcium 8.8 (*)    Total Protein 5.9 (*)    All other components within normal limits  SARS CORONAVIRUS 2 (TAT 6-24 HRS)  URINALYSIS, ROUTINE W REFLEX MICROSCOPIC  PROTIME-INR  CBG MONITORING, ED    EKG EKG Interpretation  Date/Time:  Wednesday May 27 2019 12:56:12 EST Ventricular Rate:  96 PR Interval:    QRS Duration: 77 QT Interval:  339 QTC Calculation: 429 R Axis:   31 Text Interpretation: Sinus rhythm Abnormal R-wave progression, early transition No significant change since last tracing Confirmed by Isla Pence [phone]) on 05/27/2019 1:27:36 PM   Radiology Dg Chest 2 View  Result Date: 05/27/2019 CLINICAL DATA:  Syncope. EXAM: CHEST - 2 VIEW COMPARISON:  None. FINDINGS: The heart size and mediastinal contours are within normal limits. Both lungs are clear. The visualized skeletal structures are unremarkable. IMPRESSION: No active cardiopulmonary disease. Electronically Signed   By: Dorise Bullion III M.D   On: 05/27/2019 13:18   Ct Head  Wo Contrast  Result Date: 05/27/2019 CLINICAL DATA:  Fall, glioblastoma EXAM: CT HEAD WITHOUT CONTRAST TECHNIQUE: Contiguous axial images were obtained from the base of the skull through the vertex without intravenous contrast. COMPARISON:  CT July 2020 FINDINGS: Brain: There is no acute intracranial hemorrhage. Heterogeneous left parasagittal frontal lesion is better evaluated on MRI. Extent of confluent hypoattenuation in the left greater than right cerebral hemispheres is similar in extent. Partial effacement of the left lateral ventricle is unchanged. There is no new loss of gray-white differentiation. Vascular: Intracranial atherosclerotic calcification at the skull base. Skull: Postoperative changes of left frontal craniotomy. Sinuses/Orbits: Minor paranasal sinus mucosal thickening. Orbits are unremarkable. Other: Mastoid air cells are clear. IMPRESSION: No evidence of acute intracranial injury. Additional findings better evaluated on recent MRI detailed above. Electronically Signed   By: Macy Mis M.D.   On: 05/27/2019 14:03   Ct Cervical Spine Wo Contrast  Result Date: 05/27/2019 CLINICAL DATA:  Fall. EXAM: CT CERVICAL SPINE WITHOUT CONTRAST TECHNIQUE: Multidetector CT imaging of the cervical spine was performed without intravenous contrast. Multiplanar CT image reconstructions were also generated. COMPARISON:  None. FINDINGS: Alignment: Straightening of normal cervical lordosis. Skull base and vertebrae: No acute fracture. No primary bone lesion or focal pathologic process. Soft tissues and spinal canal: No prevertebral fluid or swelling. No visible canal hematoma. Disc levels: Diffuse loss of intervertebral disc height is seen at C3-4, C4-5, and C5-6. Marked loss of disc height also noted C7-T1. Facet osteoarthritis is most advanced at lower levels on the left. Upper chest: Unremarkable. Other: None. IMPRESSION: No evidence for cervical spine fracture. Loss of cervical lordosis. This can be  related to patient positioning, muscle spasm or soft tissue injury. Diffuse degenerative disc disease. Electronically Signed   By: Misty Stanley M.D.   On: 05/27/2019 13:50    Procedures Procedures (including critical care time)  Medications Ordered in ED Medications  apixaban (ELIQUIS) tablet 5 mg (has no administration in time range)  lacosamide (VIMPAT) tablet 50 mg (has no administration in time range)  levETIRAcetam (KEPPRA) tablet 1,500 mg (has no administration in time range)  multivitamin tablet 1 tablet (has no administration in time range)  vitamin B-12 (CYANOCOBALAMIN) tablet 500 mcg (has no administration in time range)  temozolomide (TEMODAR) capsule 280 mg (has no administration in time range)  temozolomide (TEMODAR) capsule 40 mg (has no administration in time range)  dexamethasone (DECADRON) tablet 4 mg (has no administration in time range)     Initial Impression / Assessment and Plan / ED Course  I have reviewed the triage vital signs and the nursing notes.  Pertinent labs & imaging results that were available during my care of the patient were reviewed by me and considered in my medical decision making (see chart for details).       Pt was able to get up with assistance and walk with a walker.  EMS said family was interested in rehab.  He does not meet admission criteria right now.  SW and PT consulted. I did speak with the daughter who said his walking has worsened.  She will take him home if he can't get in.  Pt d/w Dr. Mickeal Skinner (neuro-oncology).  He thinks pt possibly had a breakthrough seizure.  He does recommend putting him on decadron 4 mg daily and he will follow up in the clinic.  Final Clinical Impressions(s) / ED Diagnoses   Final diagnoses:  Fall, initial encounter  Ambulatory dysfunction  Glioblastoma [facility])    ED Discharge Orders         Ordered    dexamethasone (DECADRON) 4 MG tablet  Daily     05/27/19 1529           Isla Pence, MD  05/27/19 1531

## 2019-05-27 NOTE — ED Notes (Signed)
Pt ambulated with walker with 2 person assist. First time pt stood up, pt sat right back down on bed. Stood up again with assistance again, only took about 10 steps. In EMS report, medic stated that pt is supposed to call out for help when he needs to get up and walk and uses their assistance.

## 2019-05-27 NOTE — ED Notes (Signed)
Patient transported to X-ray 

## 2019-05-27 NOTE — ED Triage Notes (Signed)
Pt arrives via [facility] from home, pt has hx of brain tumor with some associated cognitive and physical disabilities. Per family they heard a loud noise and went in to find patient on the floor on his R side which family states is typical when pt falls. Pt also has some weakness in R arm which is baseline for him. ccollar in place. Pt alert, disoriented to date, able to follow commands. Pt at baseline per family.

## 2019-05-27 NOTE — Evaluation (Signed)
Physical Therapy Evaluation Patient Details Name: Christopher Morris MRN: [id number] DOB: 1940/10/24 Today's Date: 05/27/2019   History of Present Illness  Pt is a 78 y.o. M with significant PMH of left frontal glioblastoma s/p resection who presents to ED with weakness and falls.  Clinical Impression  Pt admitted with above. Pt does not recall having the fall; he is following all simple commands appropriately. On PT evaluation, pt ambulating 60 feet with walker at a min guard assist level. This is similar to the level he discharged from CIR 2 months ago. Presents with decreased cognition, gait abnormalities, balance impairments. Will benefit from HHPT at discharge to address deficits.    Follow Up Recommendations Home health PT;Supervision/Assistance - 24 hour    Equipment Recommendations  None recommended by PT    Recommendations for Other Services OT consult     Precautions / Restrictions Precautions Precautions: Fall Restrictions Weight Bearing Restrictions: No      Mobility  Bed Mobility Overal bed mobility: Needs Assistance Bed Mobility: Supine to Sit;Sit to Supine     Supine to sit: Min guard Sit to supine: Min guard      Transfers Overall transfer level: Needs assistance Equipment used: Rolling walker (2 wheeled) Transfers: Sit to/from Stand Sit to Stand: Min guard            Ambulation/Gait Ambulation/Gait assistance: Min guard Gait Distance (Feet): 60 Feet Assistive device: Rolling walker (2 wheeled) Gait Pattern/deviations: Step-through pattern;Decreased stride length;Trunk flexed;Shuffle Gait velocity: decreased Gait velocity interpretation: <1.8 ft/sec, indicate of risk for recurrent falls General Gait Details: Pt with shuffling gait pattern; cues for walker proximity, trunk flexed. No overt LOB  Stairs            Wheelchair Mobility    Modified Rankin (Stroke Patients Only)       Balance Overall balance assessment: Needs  assistance Sitting-balance support: Feet supported Sitting balance-Leahy Scale: Good     Standing balance support: Bilateral upper extremity supported Standing balance-Leahy Scale: Poor Standing balance comment: reliant on external support                             Pertinent Vitals/Pain Pain Assessment: Faces Faces Pain Scale: No hurt    Home Living Family/patient expects to be discharged to:: Private residence Living Arrangements: Children(daughter) Available Help at Discharge: Available 24 hours/day Type of Home: House Home Access: Stairs to enter   [employer] of Steps: 1 Home Layout: One level Home Equipment: Walker - 4 wheels;Shower seat      Prior Function Level of Independence: Needs assistance   Gait / Transfers Assistance Needed: Uses rollator for functional mobility  ADL's / Homemaking Assistance Needed: assist for transferring into shower, all IADL's        Hand Dominance   Dominant Hand: Right    Extremity/Trunk Assessment   Upper Extremity Assessment Upper Extremity Assessment: Overall WFL for tasks assessed    Lower Extremity Assessment Lower Extremity Assessment: Generalized weakness    Cervical / Trunk Assessment Cervical / Trunk Assessment: Kyphotic  Communication   Communication: No difficulties  Cognition Arousal/Alertness: Awake/alert Behavior During Therapy: WFL for tasks assessed/performed Overall Cognitive Status: Impaired/Different from baseline Area of Impairment: Memory;Orientation;Awareness                 Orientation Level: Disoriented to;Time   Memory: Decreased short-term memory     Awareness: Emergent   General Comments: Pt does not recall fall. A&Ox2,  oriented to self and place. stating month was october and year was 2021      General Comments      Exercises     Assessment/Plan    PT Assessment Patient needs continued PT services  PT Problem List Decreased strength;Decreased  activity tolerance;Decreased balance;Decreased mobility;Decreased cognition;Decreased safety awareness       PT Treatment Interventions DME instruction;Gait training;Stair training;Functional mobility training;Therapeutic activities;Therapeutic exercise;Balance training;Patient/family education    PT Goals (Current goals can be found in the Care Plan section)  Acute Rehab PT Goals Patient Stated Goal: none stated; agreeable to therapy PT Goal Formulation: With patient Time For Goal Achievement: 06/10/19 Potential to Achieve Goals: Fair    Frequency Min 3X/week   Barriers to discharge        Co-evaluation               AM-PAC PT "6 Clicks" Mobility  Outcome Measure Help needed turning from your back to your side while in a flat bed without using bedrails?: None Help needed moving from lying on your back to sitting on the side of a flat bed without using bedrails?: A Little Help needed moving to and from a bed to a chair (including a wheelchair)?: A Little Help needed standing up from a chair using your arms (e.g., wheelchair or bedside chair)?: A Little Help needed to walk in hospital room?: A Little Help needed climbing 3-5 steps with a railing? : A Lot 6 Click Score: 18    End of Session Equipment Utilized During Treatment: Gait belt Activity Tolerance: Patient tolerated treatment well Patient left: in bed;with call bell/phone within reach Nurse Communication: Mobility status PT Visit Diagnosis: Unsteadiness on feet (R26.81);Other abnormalities of gait and mobility (R26.89);Muscle weakness (generalized) (M62.81);Difficulty in walking, not elsewhere classified (R26.2)    Time: [id number] PT Time Calculation (min) (ACUTE ONLY): 18 min   Charges:   PT Evaluation $PT Eval Moderate Complexity: 1 Mod          Ellamae Sia, [place], DPT Acute Rehabilitation Services Pager [phone] Office [phone]   Willy Eddy 05/27/2019, 5:17 PM

## 2019-05-27 NOTE — ED Notes (Signed)
Patient and daughter verbalize understanding of discharge instructions. Opportunity for questioning and answers were provided. Armband removed by staff, pt discharged from ED

## 2019-05-27 NOTE — Discharge Instructions (Addendum)
Take decadron 4 mg daily.

## 2019-06-22 ENCOUNTER — Other Ambulatory Visit: Payer: Self-pay | Admitting: Internal Medicine

## 2019-06-22 ENCOUNTER — Other Ambulatory Visit: Payer: Self-pay

## 2019-06-22 ENCOUNTER — Inpatient Hospital Stay (HOSPITAL_BASED_OUTPATIENT_CLINIC_OR_DEPARTMENT_OTHER): Payer: Medicare Other | Admitting: Internal Medicine

## 2019-06-22 ENCOUNTER — Inpatient Hospital Stay: Payer: Medicare Other | Attending: Internal Medicine

## 2019-06-22 VITALS — BP 123/61 | HR 72 | Temp 97.8°F | Resp 18 | Ht 76.0 in | Wt 208.6 lb

## 2019-06-22 DIAGNOSIS — Z7901 Long term (current) use of anticoagulants: Secondary | ICD-10-CM | POA: Insufficient documentation

## 2019-06-22 DIAGNOSIS — C711 Malignant neoplasm of frontal lobe: Secondary | ICD-10-CM

## 2019-06-22 DIAGNOSIS — R569 Unspecified convulsions: Secondary | ICD-10-CM

## 2019-06-22 DIAGNOSIS — R112 Nausea with vomiting, unspecified: Secondary | ICD-10-CM | POA: Diagnosis not present

## 2019-06-22 LAB — CMP (CANCER CENTER ONLY)
ALT: 17 U/L (ref 0–44)
AST: 12 U/L — ABNORMAL LOW (ref 15–41)
Albumin: 3.6 g/dL (ref 3.5–5.0)
Alkaline Phosphatase: 80 U/L (ref 38–126)
Anion gap: 8 (ref 5–15)
BUN: 21 mg/dL (ref 8–23)
CO2: 30 mmol/L (ref 22–32)
Calcium: 8.6 mg/dL — ABNORMAL LOW (ref 8.9–10.3)
Chloride: 103 mmol/L (ref 98–111)
Creatinine: 0.81 mg/dL (ref 0.61–1.24)
GFR, Est AFR Am: >60 mL/min (ref >60)
GFR, Estimated: >60 mL/min (ref >60)
Glucose, Bld: 117 mg/dL — ABNORMAL HIGH (ref 70–99)
Potassium: 4.6 mmol/L (ref 3.5–5.1)
Sodium: 141 mmol/L (ref 135–145)
Total Bilirubin: 0.9 mg/dL (ref 0.3–1.2)
Total Protein: 6.3 g/dL — ABNORMAL LOW (ref 6.5–8.1)

## 2019-06-22 LAB — CBC WITH DIFFERENTIAL (CANCER CENTER ONLY)
Abs Immature Granulocytes: 0.22 10*3/uL — ABNORMAL HIGH (ref 0.00–0.07)
Basophils Absolute: 0 10*3/uL (ref 0.0–0.1)
Basophils Relative: 0 %
Eosinophils Absolute: 0 10*3/uL (ref 0.0–0.5)
Eosinophils Relative: 0 %
HCT: 40.8 % (ref 39.0–52.0)
Hemoglobin: 13.4 g/dL (ref 13.0–17.0)
Immature Granulocytes: 3 %
Lymphocytes Relative: 26 %
Lymphs Abs: 2.3 10*3/uL (ref 0.7–4.0)
MCH: 30.2 pg (ref 26.0–34.0)
MCHC: 32.8 g/dL (ref 30.0–36.0)
MCV: 91.9 fL (ref 80.0–100.0)
Monocytes Absolute: 0.6 10*3/uL (ref 0.1–1.0)
Monocytes Relative: 6 %
Neutro Abs: 5.7 10*3/uL (ref 1.7–7.7)
Neutrophils Relative %: 65 %
Platelet Count: 149 10*3/uL — ABNORMAL LOW (ref 150–400)
RBC: 4.44 MIL/uL (ref 4.22–5.81)
RDW: 14.1 % (ref 11.5–15.5)
WBC Count: 8.8 10*3/uL (ref 4.0–10.5)
nRBC: 0 % (ref 0.0–0.2)

## 2019-06-22 MED ORDER — TEMOZOLOMIDE 20 MG PO CAPS: 40.0000 mg | ORAL_CAPSULE | Freq: Every day | ORAL | 0 refills | Status: DC

## 2019-06-22 MED ORDER — DEXAMETHASONE 2 MG PO TABS: 2.0000 mg | ORAL_TABLET | Freq: Every day | ORAL | 0 refills | Status: DC

## 2019-06-22 MED ORDER — TEMOZOLOMIDE 140 MG PO CAPS: 280.0000 mg | ORAL_CAPSULE | Freq: Every day | ORAL | 0 refills | Status: DC

## 2019-06-22 MED FILL — DEXAMETHASONE 2 MG TABLET: 2 | 50 days supply | Qty: 30 | Fill #0

## 2019-06-22 NOTE — Progress Notes (Signed)
[facility] at [place], [place] [address] [phone]   Interval Evaluation  Date of Service: 06/22/19 Patient Name: Christopher Morris Patient MRN: [id number] Patient DOB: [birth year] Provider: Ventura Sellers, MD  Identifying Statement:  Christopher Morris is a 78 y.o. male with left frontal glioblastoma    Oncologic History: Oncology History  Frontal glioblastoma multiforme ([place])  09/30/2017 Surgery   Craniotomy, resection by Dr. Sherwood Gambler.  Path demonstrates glioblastoma   10/28/2017 -  Radiation Therapy   IMRT with concurrent Temodar    - 11/17/2018 Chemotherapy   The patient completed 9th cycle of 5-day Temozolomide, transitioned to observation due to cytopenia and stable disease      Biomarkers:  MGMT Methylated.  IDH 1/2 Unknown.  EGFR Amplified  EGFRvIII positive   Interval History:  Christopher Morris presents today for follow up after cycle #11 of temodar.  He and his daughter describe relative stability over the past month.  Still with little functional use of right hand, requires help with most ADLs.  Continues to walk with his walker or some form of assistance. On decadron 56m daily. Otherwise denies additional seizures discharge, headaches.    Medications: Current Outpatient Medications on File Prior to Visit  Medication Sig Dispense Refill  . apixaban (ELIQUIS) 5 MG TABS tablet Take 1 tablet (5 mg total) by mouth 2 (two) times daily. 60 tablet 0  . dexamethasone (DECADRON) 4 MG tablet Take 1 tablet (4 mg total) by mouth daily. 30 tablet 0  . lacosamide (VIMPAT) 50 MG TABS tablet Take 1 tablet (50 mg total) by mouth 2 (two) times daily. 60 tablet 3  . levETIRAcetam (KEPPRA) 250 MG tablet Take 6 tablets (1,500 mg total) by mouth 2 (two) times daily.    . Multiple Vitamin (MULTIVITAMIN) tablet Take 1 tablet by mouth daily.    . ondansetron (ZOFRAN) 8 MG tablet Take 1 tablet (8 mg total) by mouth 2 (two) times daily as  needed. Start on the third day after chemotherapy. (Patient not taking: Reported on 05/25/2019) 30 tablet 1  . temozolomide (TEMODAR) 140 MG capsule Take 2 capsules (280 mg total) by mouth daily. May take on an empty stomach to decrease nausea & vomiting. (Patient not taking: Reported on 05/25/2019) 10 capsule 0  . temozolomide (TEMODAR) 140 MG capsule Take 2 capsules (280 mg total) by mouth daily. May take on an empty stomach to decrease nausea & vomiting. (Patient not taking: Reported on 06/22/2019) 10 capsule 0  . temozolomide (TEMODAR) 20 MG capsule Take 2 capsules (40 mg total) by mouth daily. May take on an empty stomach to decrease nausea & vomiting. (Patient not taking: Reported on 05/25/2019) 10 capsule 0  . temozolomide (TEMODAR) 20 MG capsule Take 2 capsules (40 mg total) by mouth daily. May take on an empty stomach to decrease nausea & vomiting. (Patient not taking: Reported on 06/22/2019) 10 capsule 0  . vitamin B-12 (CYANOCOBALAMIN) 500 MCG tablet Take 500 mcg by mouth daily.     No current facility-administered medications on file prior to visit.     Allergies:  Allergies  Allergen Reactions  . Pork-Derived Products     Patient does not eat pork  . Shellfish Allergy     Patient does not eat shellfish   Past Medical History:  Past Medical History:  Diagnosis Date  . Brain cancer ([facility]   . Glioblastoma ([place] 09/25/2017  . Hyperglycemia 09/25/2017  . Seizure ([facility] 09/25/2017  Past Surgical History:  Past Surgical History:  Procedure Laterality Date  . APPLICATION OF CRANIAL NAVIGATION N/A 09/30/2017   Procedure: APPLICATION OF CRANIAL NAVIGATION;  Surgeon: [name], MD;  Location: [facility];  Service: Neurosurgery;  Laterality: N/A;  . CRANIOTOMY N/A 09/30/2017   Procedure: CRANIOTOMY FOR RESECTION OF BRAIN TUMOR WITH STEREOTACTIC;  Surgeon: [name], MD;  Location: [facility];  Service: Neurosurgery;  Laterality: N/A;  CRANIOTOMY FOR RESECTION OF BRAIN TUMOR WITH  STEREOTACTIC   Social History:  Social History   Socioeconomic History  . Marital status: Divorced    Spouse name: Not on file  . Number of children: Not on file  . Years of education: Not on file  . Highest education level: Not on file  Occupational History  . Not on file  Social Needs  . Financial resource strain: Not on file  . Food insecurity    Worry: Not on file    Inability: Not on file  . Transportation needs    Medical: No    Non-medical: No  Tobacco Use  . Smoking status: Never Smoker  . Smokeless tobacco: Never Used  Substance and Sexual Activity  . Alcohol use: No    Frequency: Never  . Drug use: No  . Sexual activity: Not Currently  Lifestyle  . Physical activity    Days per week: 5 days    Minutes per session: Not on file  . Stress: Only a little  Relationships  . Social Herbalist on phone: Not on file    Gets together: Not on file    Attends religious service: Not on file    Active member of club or organization: Not on file    Attends meetings of clubs or organizations: Not on file    Relationship status: Not on file  . Intimate partner violence    Fear of current or ex partner: Not on file    Emotionally abused: Not on file    Physically abused: Not on file    Forced sexual activity: Not on file  Other Topics Concern  . Not on file  Social History Narrative  . Not on file   Family History:  Family History  Problem Relation Age of Onset  . Hypertension Daughter   . Stroke Mother   . Cancer Neg Hx     Review of Systems: Constitutional: Denies fevers, chills or abnormal weight loss Eyes: Denies blurriness of vision Ears, nose, mouth, throat, and face: Denies mucositis or sore throat Respiratory: Denies cough, dyspnea or wheezes Cardiovascular: Denies palpitation, chest discomfort or lower extremity swelling Gastrointestinal:  Denies nausea, constipation, diarrhea GU: Denies dysuria or incontinence Skin: Denies abnormal skin  rashes Neurological: Per HPI Musculoskeletal: Denies joint pain, back or neck discomfort. No decrease in ROM Behavioral/Psych: Denies anxiety, disturbance in thought content, and mood instability  Physical Exam: Vitals:   06/22/19 1207  BP: 123/61  Pulse: 72  Resp: 18  Temp: 97.8 F (36.6 C)  SpO2: 98%   KPS: 80. General: Alert, cooperative, pleasant, in no acute distress Head: Craniotomy scar noted, dry and intact. EENT: No conjunctival injection or scleral icterus. Oral mucosa moist Lungs: Resp effort normal Cardiac: Regular rate and rhythm Abdomen: Soft, non-distended abdomen Skin: No rashes cyanosis or petechiae. Extremities: No clubbing or edema  Neurologic Exam: Mental Status: Awake, alert, attentive to examiner. Oriented to self and environment. Language is notably impaired in regards to fluency with impaired comprehension to two step commands.  Cranial Nerves: Visual acuity is grossly normal. Visual fields are full. Extra-ocular movements intact. No ptosis. Face is symmetric, tongue midline. Motor: Tone and bulk are normal. Right arm and leg 4/5, left side 5/5 power. Reflexes are symmetric, no pathologic reflexes present. Intact finger to nose bilaterally Sensory: Intact to light touch and temperature Gait: Independent but purposeful and wide based   Labs: I have reviewed the data as listed    Component Value Date/Time   NA 141 06/22/2019 1136   NA 136 (A) 10/18/2017   K 4.6 06/22/2019 1136   CL 103 06/22/2019 1136   CO2 30 06/22/2019 1136   GLUCOSE 117 (H) 06/22/2019 1136   BUN 21 06/22/2019 1136   BUN 19 10/18/2017   CREATININE 0.81 06/22/2019 1136   CALCIUM 8.6 (L) 06/22/2019 1136   PROT 6.3 (L) 06/22/2019 1136   ALBUMIN 3.6 06/22/2019 1136   AST 12 (L) 06/22/2019 1136   ALT 17 06/22/2019 1136   ALKPHOS 80 06/22/2019 1136   BILITOT 0.9 06/22/2019 1136   GFRNONAA >60 06/22/2019 1136   GFRAA >60 06/22/2019 1136   Lab Results  Component Value Date    WBC 8.8 06/22/2019   NEUTROABS 5.7 06/22/2019   HGB 13.4 06/22/2019   HCT 40.8 06/22/2019   MCV 91.9 06/22/2019   PLT 149 (L) 06/22/2019     Assessment/Plan 1. Glioblastoma ([place])  2. Seizures [facility])  Mr. Mullen is clinically stable today.    We recommended initiating treatment with cycle #12 Temozolomide 150 mg/m2, on for five days and off for twenty three days in twenty eight day cycles. The patient will have a complete blood count performed on days 21 and 28 of each cycle, and a comprehensive metabolic panel performed on day 28 of each cycle. Labs may need to be performed more often. Zofran will prescribed for home use for nausea/vomiting.   Chemotherapy should be held for the following:  ANC less than 1,000  Platelets less than 100,000  LFT or creatinine greater than 2x ULN  If clinical concerns/contraindications develop  He should continue Keppra 1569m BID, Vimpat 529mBID and Eliquis.  Decadron will decrease to 80m61maily x1 week, then 1mg80mily thereafter  We recommend he return in 1 month with MRI brain for evaluation, or sooner as needed.  All questions were answered. The patient knows to call the clinic with any problems, questions or concerns. No barriers to learning were detected.  The total time spent in the encounter was 25 minutes and more than 50% was on counseling and review of test results   ZachVentura Sellers Medical Director of Neuro-Oncology [facility]07/20 12:12 PM

## 2019-06-23 ENCOUNTER — Telehealth: Payer: Self-pay | Admitting: Internal Medicine

## 2019-06-23 NOTE — Telephone Encounter (Signed)
Scheduled appt per 12/7 los.  Left a vm of the appt date and time.

## 2019-06-25 ENCOUNTER — Telehealth: Payer: Self-pay | Admitting: Pharmacy Technician

## 2019-06-25 ENCOUNTER — Telehealth: Payer: Self-pay

## 2019-06-25 MED FILL — TEMOZOLOMIDE 20 MG CAPS: 20 | 28 days supply | Qty: 10 | Fill #0

## 2019-06-25 MED FILL — TEMOZOLOMIDE 140 MG CAPS: 140 | 28 days supply | Qty: 10 | Fill #0

## 2019-06-25 NOTE — Telephone Encounter (Addendum)
Oral Oncology Patient Advocate Encounter  Faxed claim form, receipt and Rx labels to the [facility] on 06/08/2019 for Temozolomide reimbursement to [facility].  Prescriptions filled 05/25/2019 and processed through register on 05/26/2019.  Amount $164.16.  Fax number to [facility]:  [phone]  Phone number: [phone].  [place] Patient [place] Phone [phone] Fax [phone] 06/25/2019 4:21 PM

## 2019-06-25 NOTE — Telephone Encounter (Signed)
Faxed form for reimbursement for Temodar to [facility]  Fax [phone] Request to reimburse [place] Patient [place] Phone [phone] Fax [phone] 06/25/2019 4:21 PM

## 2019-06-30 NOTE — Telephone Encounter (Signed)
Oral Oncology Patient Advocate Encounter  [place] to check on the status of payment for patients medicaitons.  Representative stated that payment has been sent.  I am having [facility] research remittance payments to make sure payment was received and have AR account balance updated.  [place] Patient [place] Phone [phone] Fax [phone] 06/30/2019 11:49 AM

## 2019-07-06 ENCOUNTER — Telehealth: Payer: Self-pay | Admitting: *Deleted

## 2019-07-06 ENCOUNTER — Other Ambulatory Visit: Payer: Self-pay | Admitting: Radiation Therapy

## 2019-07-06 ENCOUNTER — Other Ambulatory Visit: Payer: Self-pay | Admitting: Internal Medicine

## 2019-07-06 MED ORDER — DEXAMETHASONE 4 MG PO TABS: 4.0000 mg | ORAL_TABLET | Freq: Every day | ORAL | 0 refills | Status: DC

## 2019-07-06 NOTE — Telephone Encounter (Signed)
Daughter called to report change in patient since Friday.  She states that prior to Friday he was walking yet slow but still walking.  Since Friday he has been unable to walk, and no core strength to hold himself upright.  She states that he look his last Decadron 06/27/2019 from the taper that was given.  Also took last dose of Temodar on 07/03/2019.   Per Dr. Mickeal Skinner instructed to get MRI scheduled for expected date 07/24/2019 and do a 7 day higher dose of Decadron.  If no improvement is seen after starting higher dose of steroid then daughter is aware that she should call us to report if no improvement is seen.

## 2019-07-07 MED FILL — DEXAMETHASONE 4 MG TABLET: 4 | 7 days supply | Qty: 7 | Fill #0

## 2019-07-14 ENCOUNTER — Telehealth: Payer: Self-pay | Admitting: *Deleted

## 2019-07-14 NOTE — Telephone Encounter (Signed)
Patients daughter called to report that steroid dosing did not improve the patients condition much at all.  Prior to steroids she states that he was unable to walk and that remained the same.  Reports he wasn't communicating much but reports him speaking only one or two more words.  He was unable to hold a spoon or fork to feed himself prior and with steroid use he was able to hold fork but unable to feed himself so they are now having to feed him.  Daughter is going to attempt to move up MRI to a sooner date than as scheduled.   Made Dr. Mickeal Skinner aware.

## 2019-07-23 ENCOUNTER — Ambulatory Visit (HOSPITAL_COMMUNITY)
Admission: RE | Admit: 2019-07-23 | Discharge: 2019-07-23 | Disposition: A | Discharge status: Home / Self Care | Payer: Medicare Other | Source: Ambulatory Visit | Attending: Internal Medicine | Admitting: Internal Medicine

## 2019-07-23 ENCOUNTER — Other Ambulatory Visit: Payer: Self-pay

## 2019-07-23 DIAGNOSIS — C711 Malignant neoplasm of frontal lobe: Secondary | ICD-10-CM | POA: Diagnosis present

## 2019-07-23 MED ORDER — GADOBUTROL 1 MMOL/ML IV SOLN
10.0000 mL | Freq: Once | INTRAVENOUS | Status: AC | PRN
  Administered 2019-07-23: 10 mL via INTRAVENOUS

## 2019-07-27 ENCOUNTER — Inpatient Hospital Stay: Payer: Medicare Other | Attending: Internal Medicine

## 2019-07-27 ENCOUNTER — Other Ambulatory Visit: Payer: Self-pay

## 2019-07-27 ENCOUNTER — Inpatient Hospital Stay (HOSPITAL_BASED_OUTPATIENT_CLINIC_OR_DEPARTMENT_OTHER): Payer: Medicare Other | Admitting: Internal Medicine

## 2019-07-27 ENCOUNTER — Inpatient Hospital Stay: Payer: Medicare Other

## 2019-07-27 VITALS — BP 129/70 | HR 86 | Temp 98.0°F | Resp 17

## 2019-07-27 DIAGNOSIS — R531 Weakness: Secondary | ICD-10-CM | POA: Diagnosis not present

## 2019-07-27 DIAGNOSIS — R569 Unspecified convulsions: Secondary | ICD-10-CM | POA: Insufficient documentation

## 2019-07-27 DIAGNOSIS — C711 Malignant neoplasm of frontal lobe: Secondary | ICD-10-CM

## 2019-07-27 LAB — CMP (CANCER CENTER ONLY)
ALT: 12 U/L (ref 0–44)
AST: 11 U/L — ABNORMAL LOW (ref 15–41)
Albumin: 3.6 g/dL (ref 3.5–5.0)
Alkaline Phosphatase: 92 U/L (ref 38–126)
Anion gap: 10 (ref 5–15)
BUN: 10 mg/dL (ref 8–23)
CO2: 25 mmol/L (ref 22–32)
Calcium: 8.9 mg/dL (ref 8.9–10.3)
Chloride: 104 mmol/L (ref 98–111)
Creatinine: 0.84 mg/dL (ref 0.61–1.24)
GFR, Est AFR Am: >60 mL/min (ref >60)
GFR, Estimated: >60 mL/min (ref >60)
Glucose, Bld: 126 mg/dL — ABNORMAL HIGH (ref 70–99)
Potassium: 3.9 mmol/L (ref 3.5–5.1)
Sodium: 139 mmol/L (ref 135–145)
Total Bilirubin: 0.9 mg/dL (ref 0.3–1.2)
Total Protein: 6.4 g/dL — ABNORMAL LOW (ref 6.5–8.1)

## 2019-07-27 LAB — CBC WITH DIFFERENTIAL (CANCER CENTER ONLY)
Abs Immature Granulocytes: 0.02 10*3/uL (ref 0.00–0.07)
Basophils Absolute: 0 10*3/uL (ref 0.0–0.1)
Basophils Relative: 1 %
Eosinophils Absolute: 0.3 10*3/uL (ref 0.0–0.5)
Eosinophils Relative: 4 %
HCT: 36.7 % — ABNORMAL LOW (ref 39.0–52.0)
Hemoglobin: 12.2 g/dL — ABNORMAL LOW (ref 13.0–17.0)
Immature Granulocytes: 0 %
Lymphocytes Relative: 37 %
Lymphs Abs: 2.5 10*3/uL (ref 0.7–4.0)
MCH: 30.7 pg (ref 26.0–34.0)
MCHC: 33.2 g/dL (ref 30.0–36.0)
MCV: 92.4 fL (ref 80.0–100.0)
Monocytes Absolute: 0.5 10*3/uL (ref 0.1–1.0)
Monocytes Relative: 7 %
Neutro Abs: 3.4 10*3/uL (ref 1.7–7.7)
Neutrophils Relative %: 51 %
Platelet Count: 223 10*3/uL (ref 150–400)
RBC: 3.97 MIL/uL — ABNORMAL LOW (ref 4.22–5.81)
RDW: 13.2 % (ref 11.5–15.5)
WBC Count: 6.6 10*3/uL (ref 4.0–10.5)
nRBC: 0 % (ref 0.0–0.2)

## 2019-07-27 NOTE — Progress Notes (Signed)
[facility] at [place], [place] [address] [phone]   Interval Evaluation  Date of Service: 07/27/19 Patient Name: Christopher Morris Patient MRN: [id number] Patient DOB: 11/13/1940 Provider: Ventura Sellers, MD  Identifying Statement:  Christopher Morris is a 79 y.o. male with left frontal glioblastoma    Oncologic History: Oncology History  Frontal glioblastoma multiforme ([place])  09/30/2017 Surgery   Craniotomy, resection by Dr. Sherwood Gambler.  Path demonstrates glioblastoma   10/28/2017 -  Radiation Therapy   IMRT with concurrent Temodar    - 11/17/2018 Chemotherapy   The patient completed 9th cycle of 5-day Temozolomide, transitioned to observation due to cytopenia and stable disease      Biomarkers:  MGMT Methylated.  IDH 1/2 Unknown.  EGFR Amplified  EGFRvIII positive   Interval History:  Christopher Morris presents today for follow up after cycle #12 of temodar.  He and his daughter describe several weeks of worsening right sided weakness, imbalance, confusion and overall fatigue.  Still with little functional use of right hand, requires help with most ADLs.  No longer utilizing walker for ambulation, is fully reliant on others or wheelchair for transport. Otherwise denies additional seizures discharge, headaches.    Medications: Current Outpatient Medications on File Prior to Visit  Medication Sig Dispense Refill  . apixaban (ELIQUIS) 5 MG TABS tablet Take 1 tablet (5 mg total) by mouth 2 (two) times daily. 60 tablet 0  . lacosamide (VIMPAT) 50 MG TABS tablet Take 1 tablet (50 mg total) by mouth 2 (two) times daily. 60 tablet 3  . levETIRAcetam (KEPPRA) 250 MG tablet Take 6 tablets (1,500 mg total) by mouth 2 (two) times daily.    Christopher Kitchen dexamethasone (DECADRON) 4 MG tablet Take 1 tablet (4 mg total) by mouth daily. (Patient not taking: Reported on 07/27/2019) 7 tablet 0  . Multiple Vitamin (MULTIVITAMIN) tablet Take 1 tablet by mouth  daily.    . ondansetron (ZOFRAN) 8 MG tablet Take 1 tablet (8 mg total) by mouth 2 (two) times daily as needed. Start on the third day after chemotherapy. (Patient not taking: Reported on 05/25/2019) 30 tablet 1   No current facility-administered medications on file prior to visit.    Allergies:  Allergies  Allergen Reactions  . Pork-Derived Products     Patient does not eat pork  . Shellfish Allergy     Patient does not eat shellfish   Past Medical History:  Past Medical History:  Diagnosis Date  . Brain cancer ([place])   . Glioblastoma ([place]) 09/25/2017  . Hyperglycemia 09/25/2017  . Seizure ([place]) 09/25/2017   Past Surgical History:  Past Surgical History:  Procedure Laterality Date  . APPLICATION OF CRANIAL NAVIGATION N/A 09/30/2017   Procedure: APPLICATION OF CRANIAL NAVIGATION;  Surgeon: [name], MD;  Location: [facility];  Service: Neurosurgery;  Laterality: N/A;  . CRANIOTOMY N/A 09/30/2017   Procedure: CRANIOTOMY FOR RESECTION OF BRAIN TUMOR WITH STEREOTACTIC;  Surgeon: [name], MD;  Location: [facility];  Service: Neurosurgery;  Laterality: N/A;  CRANIOTOMY FOR RESECTION OF BRAIN TUMOR WITH STEREOTACTIC   Social History:  Social History   Socioeconomic History  . Marital status: Divorced    Spouse name: Not on file  . Number of children: Not on file  . Years of education: Not on file  . Highest education level: Not on file  Occupational History  . Not on file  Tobacco Use  . Smoking status: Never Smoker  .  Smokeless tobacco: Never Used  Substance and Sexual Activity  . Alcohol use: No  . Drug use: No  . Sexual activity: Not Currently  Other Topics Concern  . Not on file  Social History Narrative  . Not on file   Social Determinants of Health   Financial Resource Strain:   . Difficulty of Paying Living Expenses: Not on file  Food Insecurity:   . Worried About Charity fundraiser in the Last Year: Not on file  . Ran Out of Food in the Last Year: Not on  file  Transportation Needs:   . Lack of Transportation (Medical): Not on file  . Lack of Transportation (Non-Medical): Not on file  Physical Activity:   . Days of Exercise per Week: Not on file  . Minutes of Exercise per Session: Not on file  Stress:   . Feeling of Stress : Not on file  Social Connections:   . Frequency of Communication with Friends and Family: Not on file  . Frequency of Social Gatherings with Friends and Family: Not on file  . Attends Religious Services: Not on file  . Active Member of Clubs or Organizations: Not on file  . Attends Archivist Meetings: Not on file  . Marital Status: Not on file  Intimate Partner Violence:   . Fear of Current or Ex-Partner: Not on file  . Emotionally Abused: Not on file  . Physically Abused: Not on file  . Sexually Abused: Not on file   Family History:  Family History  Problem Relation Age of Onset  . Hypertension Daughter   . Stroke Mother   . Cancer Neg Hx     Review of Systems: Constitutional: Denies fevers, chills or abnormal weight loss Eyes: Denies blurriness of vision Ears, nose, mouth, throat, and face: Denies mucositis or sore throat Respiratory: Denies cough, dyspnea or wheezes Cardiovascular: Denies palpitation, chest discomfort or lower extremity swelling Gastrointestinal:  Denies nausea, constipation, diarrhea GU: Denies dysuria or incontinence Skin: Denies abnormal skin rashes Neurological: Per HPI Musculoskeletal: Denies joint pain, back or neck discomfort. No decrease in ROM Behavioral/Psych: Denies anxiety, disturbance in thought content, and mood instability  Physical Exam: Vitals:   07/27/19 0924  BP: 129/70  Pulse: 86  Resp: 17  Temp: 98 F (36.7 C)  SpO2: 98%   KPS: 60. General: Alert, cooperative, pleasant, in no acute distress Head: Craniotomy scar noted, dry and intact. EENT: No conjunctival injection or scleral icterus. Oral mucosa moist Lungs: Resp effort normal Cardiac:  Regular rate and rhythm Abdomen: Soft, non-distended abdomen Skin: No rashes cyanosis or petechiae. Extremities: No clubbing or edema  Neurologic Exam: Mental Status: Awake, alert, attentive to examiner. Oriented to self and environment. Language is notably impaired in regards to fluency with impaired comprehension to two step commands.  Cranial Nerves: Visual acuity is grossly normal. Visual fields are full. Extra-ocular movements intact. No ptosis. Face is symmetric, tongue midline. Motor: Tone and bulk are normal. Right arm and leg 4/5 with dense motor neglect. left side 5/5 power. Reflexes are symmetric, no pathologic reflexes present. Sensory: Intact to light touch and temperature Gait: Non ambulatory  Labs: I have reviewed the data as listed    Component Value Date/Time   NA 139 07/27/2019 0906   NA 136 (A) 10/18/2017 0000   K 3.9 07/27/2019 0906   CL 104 07/27/2019 0906   CO2 25 07/27/2019 0906   GLUCOSE 126 (H) 07/27/2019 0906   BUN 10 07/27/2019 0906  BUN 19 10/18/2017 0000   CREATININE 0.84 07/27/2019 0906   CALCIUM 8.9 07/27/2019 0906   PROT 6.4 (L) 07/27/2019 0906   ALBUMIN 3.6 07/27/2019 0906   AST 11 (L) 07/27/2019 0906   ALT 12 07/27/2019 0906   ALKPHOS 92 07/27/2019 0906   BILITOT 0.9 07/27/2019 0906   GFRNONAA >60 07/27/2019 0906   GFRAA >60 07/27/2019 0906   Lab Results  Component Value Date   WBC 6.6 07/27/2019   NEUTROABS 3.4 07/27/2019   HGB 12.2 (L) 07/27/2019   HCT 36.7 (L) 07/27/2019   MCV 92.4 07/27/2019   PLT 223 07/27/2019    Imaging:  [facility] Clinician Interpretation: I have personally reviewed the CNS images as listed.  My interpretation, in the context of the patient's clinical presentation, is progressive disease  MR Brain W Wo Contrast  Result Date: 07/23/2019 CLINICAL DATA:  79 year old male with glioblastoma treated with resection and IMRT in 2019. Progressive leg weakness. Restaging. EXAM: MRI HEAD WITHOUT AND WITH CONTRAST TECHNIQUE:  Multiplanar, multiecho pulse sequences of the brain and surrounding structures were obtained without and with intravenous contrast. CONTRAST:  40m GADAVIST GADOBUTROL 1 MMOL/ML IV SOLN COMPARISON:  Brain MRI 05/22/2019 and earlier. FINDINGS: Brain: Increased thickness of nodular, irregular enhancement in the superior left frontal gyrus contiguous with the anterior body of the left lateral ventricle, and chronically involving the body of the corpus callosum. But the enhancing area is only slightly larger in the AP dimension: 38 by 27 by 33 millimeters (AP by transverse by CC), previously 35 x 28 x 32 millimeters. Regional abnormal diffusion is stable. Conspicuity of enhancement along the posterior margin of involved corpus callosum is increased on series 12, image 15 (versus series 12, image 13 previously). On T2 imaging widespread left frontal lobe T2 hyperintensity surrounding nodular more decreased T2 signal tumor (series 10, image 17) is largely stable. There is increased T2 signal in the posterior cingulate gyrus on the left (series 10, image 11 today versus series 10, image 10 previously). No other interval gyral expansion. And T2 signal crossing the corpus callosum and in the contralateral right frontal lobe appears stable. T2 hyperintensity in the left deep gray nuclei has mildly increased on series 5, image 15. No new areas of abnormal enhancement. Stable overlying craniotomy changes. No superimposed restricted diffusion suggestive of acute infarction. No midline shift, mass effect, ventriculomegaly, or acute intracranial hemorrhage. Cervicomedullary junction and pituitary are within normal limits. Vascular: Major intracranial vascular flow voids are stable. The major dural venous sinuses are enhancing and appear to be patent. Skull and upper cervical spine: Stable visible cervical spine degeneration. Visualized bone marrow signal is within normal limits. Sinuses/Orbits: Stable and negative. Other: Mastoids  remain clear. Visible internal auditory structures appear normal. IMPRESSION: Features of mild progression since the November MRI: - mildly increased thickness of irregular enhancement in the left frontal gyrus contiguous with the left lateral ventricle. - mildly increased enhancement of the involved body of the corpus callosum. - mild increased T2 signal in the posterior left cingulate gyrus, and the left deep gray nuclei. Overall tumor size not significantly changed, and no new or increased intracranial mass effect. Electronically Signed   By: HGenevie AnnM.D.   On: 07/23/2019 15:54    Assessment/Plan 1. Glioblastoma ([place]  2. Seizures ([facility]  Christopher Morris clinically and radiographically progressive today.  Although focal tumor specific changes are minimal, there is notable increase in generalized atrophy and leukomalacia when compared to MRIs from 1+ year  ago.  The latter features are more likely the driving force behind clinically observed changes. No clinical sign of systemic etiology such as infection.  We recommended consideration of second line chemotherapy such as q6 week oral CCNU, or daily metronomic Temodar. We also discussed palliative care pathways such as home hospice referral.   We will give Christopher Morris and his family several days to think over our goals of care discussion.  We will reach out formally via phone on 07/30/19 for further questions and consideration of next steps.  In the meantime he should continue Keppra '1500mg'$  BID, Vimpat '50mg'$  BID and Eliquis, as well as '1mg'$  daily decadron.  All questions were answered. The patient knows to call the clinic with any problems, questions or concerns. No barriers to learning were detected.  The total time spent in the encounter was 40 minutes and more than 50% was on counseling and review of test results   Ventura Sellers, MD Medical Director of Neuro-Oncology [facility] [place] 07/27/19 4:32 PM

## 2019-07-28 ENCOUNTER — Telehealth: Payer: Self-pay | Admitting: *Deleted

## 2019-07-28 ENCOUNTER — Telehealth: Payer: Self-pay | Admitting: Internal Medicine

## 2019-07-28 NOTE — Telephone Encounter (Signed)
Scheduled appt per 1/111 los.  Left a vm of the appt date and time,

## 2019-07-28 NOTE — Telephone Encounter (Signed)
Received call from Cindy Hazy, RN with [facility] [phone].  She just spoke with daughter [name] and they are going to proceed with hospice services.  Made, Dr. Mickeal Skinner aware.

## 2019-07-29 ENCOUNTER — Other Ambulatory Visit: Payer: Self-pay

## 2019-07-29 ENCOUNTER — Emergency Department (HOSPITAL_COMMUNITY)
Admission: EM | Admit: 2019-07-29 | Discharge: 2019-07-29 | Disposition: A | Discharge status: Home / Self Care | Payer: No Typology Code available for payment source | Attending: Emergency Medicine | Admitting: Emergency Medicine

## 2019-07-29 ENCOUNTER — Telehealth: Payer: Self-pay | Admitting: *Deleted

## 2019-07-29 DIAGNOSIS — Z79899 Other long term (current) drug therapy: Secondary | ICD-10-CM | POA: Diagnosis not present

## 2019-07-29 DIAGNOSIS — R569 Unspecified convulsions: Secondary | ICD-10-CM

## 2019-07-29 DIAGNOSIS — C719 Malignant neoplasm of brain, unspecified: Secondary | ICD-10-CM | POA: Diagnosis not present

## 2019-07-29 DIAGNOSIS — Z7901 Long term (current) use of anticoagulants: Secondary | ICD-10-CM | POA: Insufficient documentation

## 2019-07-29 LAB — CBC WITH DIFFERENTIAL/PLATELET
Abs Immature Granulocytes: 0.04 10*3/uL (ref 0.00–0.07)
Basophils Absolute: 0 10*3/uL (ref 0.0–0.1)
Basophils Relative: 1 %
Eosinophils Absolute: 0.2 10*3/uL (ref 0.0–0.5)
Eosinophils Relative: 4 %
HCT: 35.2 % — ABNORMAL LOW (ref 39.0–52.0)
Hemoglobin: 11.5 g/dL — ABNORMAL LOW (ref 13.0–17.0)
Immature Granulocytes: 1 %
Lymphocytes Relative: 33 %
Lymphs Abs: 2.1 10*3/uL (ref 0.7–4.0)
MCH: 31.1 pg (ref 26.0–34.0)
MCHC: 32.7 g/dL (ref 30.0–36.0)
MCV: 95.1 fL (ref 80.0–100.0)
Monocytes Absolute: 0.5 10*3/uL (ref 0.1–1.0)
Monocytes Relative: 8 %
Neutro Abs: 3.6 10*3/uL (ref 1.7–7.7)
Neutrophils Relative %: 53 %
Platelets: 204 10*3/uL (ref 150–400)
RBC: 3.7 MIL/uL — ABNORMAL LOW (ref 4.22–5.81)
RDW: 13.2 % (ref 11.5–15.5)
WBC: 6.5 10*3/uL (ref 4.0–10.5)
nRBC: 0 % (ref 0.0–0.2)

## 2019-07-29 LAB — COMPREHENSIVE METABOLIC PANEL
ALT: 14 U/L (ref 0–44)
AST: 15 U/L (ref 15–41)
Albumin: 3.7 g/dL (ref 3.5–5.0)
Alkaline Phosphatase: 75 U/L (ref 38–126)
Anion gap: 12 (ref 5–15)
BUN: 7 mg/dL — ABNORMAL LOW (ref 8–23)
CO2: 23 mmol/L (ref 22–32)
Calcium: 8.4 mg/dL — ABNORMAL LOW (ref 8.9–10.3)
Chloride: 106 mmol/L (ref 98–111)
Creatinine, Ser: 0.69 mg/dL (ref 0.61–1.24)
GFR calc Af Amer: >60 mL/min (ref >60)
GFR calc non Af Amer: >60 mL/min (ref >60)
Glucose, Bld: 134 mg/dL — ABNORMAL HIGH (ref 70–99)
Potassium: 3.3 mmol/L — ABNORMAL LOW (ref 3.5–5.1)
Sodium: 141 mmol/L (ref 135–145)
Total Bilirubin: 1.2 mg/dL (ref 0.3–1.2)
Total Protein: 6.5 g/dL (ref 6.5–8.1)

## 2019-07-29 MED ORDER — SODIUM CHLORIDE 0.9 % IV SOLN
100.0000 mg | Freq: Once | INTRAVENOUS | Status: AC
  Administered 2019-07-29: 100 mg via INTRAVENOUS
  Filled 2019-07-29 (×2): qty 10

## 2019-07-29 MED ORDER — LACOSAMIDE 50 MG PO TABS: 100.0000 mg | ORAL_TABLET | Freq: Two times a day (BID) | ORAL | 0 refills | Status: DC

## 2019-07-29 NOTE — ED Triage Notes (Addendum)
Pt BIB EMS from home. Pt has brain tumor. Pt is starting hospice. Pt was postictal upon arrival of EMS. Pt has difficulty speaking at baseline. Last known seizure March 2019. Hx of stroke with right side deficits.   CBG 158 99% RA

## 2019-07-29 NOTE — ED Provider Notes (Signed)
[facility] DEPT Provider Note   CSN: [id number] Arrival date & time: 07/29/19  1318     History Chief Complaint  Patient presents with  . Seizures    Christopher Morris is a 79 y.o. male. Level 5 caveat due to altered mental status. HPI Patient presents after seizure.  Known glioblastoma and is going to be starting hospice.  This has just been started and is not all the way arranged yet.  He is on Vimpat grams twice a day and Keppra 1500 twice a day.  Tonic-clonic seizure at home.  Last seizure was a year and a half ago.  Previous weakness on the right side due to stroke.  Is managed by Dr. Mickeal Skinner from neuro oncology.  Patient is awake, but somewhat confused what happened.  Daughter is reportedly on her way.  EMS blood sugar was 158.    Past Medical History:  Diagnosis Date  . Brain cancer ([place])   . Glioblastoma ([place]) 09/25/2017  . Hyperglycemia 09/25/2017  . Seizure ([place]) 09/25/2017    Patient Active Problem List   Diagnosis Date Noted  . Hypoalbuminemia due to protein-calorie malnutrition ([place])   . Metabolic encephalopathy [id number]  . Seizures ([place]) 02/03/2019  . Right sided weakness 02/02/2019  . Acute metabolic encephalopathy [id number]  . Aphasia 12/29/2018  . Pneumonia 12/22/2017  . HCAP (healthcare-associated pneumonia) 12/22/2017  . Pulmonary embolism ([place]) 12/22/2017  . Tachycardia   . Vitamin B12 deficiency 11/14/2017  . Anemia 11/14/2017  . Fever 11/13/2017  . Dehydration 11/13/2017  . Interstitial pneumonia ([place]) 11/11/2017  . Sepsis ([place]) 11/09/2017  . Bacteremia due to Gram-negative bacteria 10/26/2017  . Sepsis due to Escherichia coli (E. coli) ([place]) 10/26/2017  . UTI (urinary tract infection) 10/25/2017  . Seizure ([place]) 09/25/2017  . Frontal glioblastoma multiforme ([place]) 09/25/2017  . Hyperglycemia 09/25/2017  . BLOOD IN STOOL 07/26/2008  . ERECTILE DYSFUNCTION 07/12/2008    Past Surgical History:  Procedure Laterality  Date  . APPLICATION OF CRANIAL NAVIGATION N/A 09/30/2017   Procedure: APPLICATION OF CRANIAL NAVIGATION;  Surgeon: [name], MD;  Location: [facility];  Service: Neurosurgery;  Laterality: N/A;  . CRANIOTOMY N/A 09/30/2017   Procedure: CRANIOTOMY FOR RESECTION OF BRAIN TUMOR WITH STEREOTACTIC;  Surgeon: [name], MD;  Location: [facility];  Service: Neurosurgery;  Laterality: N/A;  CRANIOTOMY FOR RESECTION OF BRAIN TUMOR WITH STEREOTACTIC       Family History  Problem Relation Age of Onset  . Hypertension Daughter   . Stroke Mother   . Cancer Neg Hx     Social History   Tobacco Use  . Smoking status: Never Smoker  . Smokeless tobacco: Never Used  Substance Use Topics  . Alcohol use: No  . Drug use: No    Home Medications Prior to Admission medications   Medication Sig Start Date End Date Taking? Authorizing Provider  apixaban (ELIQUIS) 5 MG TABS tablet Take 1 tablet (5 mg total) by mouth 2 (two) times daily. 02/20/19  Yes Love, Ivan Anchors, PA-C  levETIRAcetam (KEPPRA) 250 MG tablet Take 6 tablets (1,500 mg total) by mouth 2 (two) times daily. 02/20/19  Yes Love, Ivan Anchors, PA-C  Multiple Vitamin (MULTIVITAMIN) tablet Take 1 tablet by mouth daily.   Yes [provider]  dexamethasone (DECADRON) 4 MG tablet Take 1 tablet (4 mg total) by mouth daily. Patient not taking: Reported on 07/27/2019 07/06/19   Ventura Sellers, MD  lacosamide (VIMPAT) 50 MG TABS tablet Take 2 tablets (  100 mg total) by mouth 2 (two) times daily. 07/29/19   Davonna Belling, MD  ondansetron (ZOFRAN) 8 MG tablet Take 1 tablet (8 mg total) by mouth 2 (two) times daily as needed. Start on the third day after chemotherapy. Patient not taking: Reported on 05/25/2019 04/27/19   Ventura Sellers, MD    Allergies    Pork-derived products and Shellfish allergy  Review of Systems   Review of Systems  Constitutional: Negative for appetite change.  Neurological: Positive for seizures.    Physical Exam  Updated Vital Signs BP 118/83   Pulse 94   Temp 98.5 F (36.9 C) (Oral)   Resp 18   SpO2 99%   Physical Exam Vitals and nursing note reviewed.  HENT:     Head: Normocephalic.  Cardiovascular:     Rate and Rhythm: Regular rhythm.  Pulmonary:     Breath sounds: No wheezing, rhonchi or rales.  Abdominal:     Tenderness: There is no abdominal tenderness.  Musculoskeletal:     Right lower leg: No edema.     Left lower leg: No edema.  Neurological:     Mental Status: He is alert.     Comments: Awake answers some questions, but somewhat confused.  Weakness on the right compared to left.  Knows he is at the hospital but thinks he is at [facility] at [facility].     ED Results / Procedures / Treatments   Labs (all labs ordered are listed, but only abnormal results are displayed) Labs Reviewed  CBC WITH DIFFERENTIAL/PLATELET - Abnormal; Notable for the following components:      Result Value   RBC 3.70 (*)    Hemoglobin 11.5 (*)    HCT 35.2 (*)    All other components within normal limits  COMPREHENSIVE METABOLIC PANEL - Abnormal; Notable for the following components:   Potassium 3.3 (*)    Glucose, Bld 134 (*)    BUN 7 (*)    Calcium 8.4 (*)    All other components within normal limits    EKG None  Radiology No results found.  Procedures Procedures (including critical care time)  Medications Ordered in ED Medications  lacosamide (VIMPAT) 100 mg in sodium chloride 0.9 % 25 mL IVPB (0 mg Intravenous Stopped 07/29/19 1550)    ED Course  I have reviewed the triage vital signs and the nursing notes.  Pertinent labs & imaging results that were available during my care of the patient were reviewed by me and considered in my medical decision making (see chart for details).    MDM Rules/Calculators/A&P                      Patient with seizure.  Going towards palliative care for glio.  Mental status improved.  Discussed with patient's daughter.  Will discharge home.   Discussed with Dr. Mickeal Skinner his neuro oncologist.  Will increase Vimpat to 100 mg twice a day from 50 mg.  Had been given IV bolus here. Final Clinical Impression(s) / ED Diagnoses Final diagnoses:  Seizure ([place])  Glioblastoma ([place])    Rx / DC Orders ED Discharge Orders         Ordered    lacosamide (VIMPAT) 50 MG TABS tablet  2 times daily     07/29/19 1603           Davonna Belling, MD 07/29/19 1626

## 2019-07-29 NOTE — Telephone Encounter (Signed)
Received vm message from pt's daughter. She states her father had a 'convulsive seizure' and called EMS. He was transported to [facility] ED.  She stated that he hasn't had that kind of seizure since 2019. Pt is currently in the ED

## 2019-07-29 NOTE — Discharge Instructions (Signed)
Increase the Vimpat to 100 mg twice a day. Your first dose can be in the morning. Follow-up with hospice as planned

## 2019-07-29 NOTE — ED Notes (Signed)
Called patient's daughter [name]. Informed her that patient was given Vimpat IV and doctor is increasing his dosage and sent him in a new prescription to [facility]. Stated that she and her son would come pick up patient. Informed that they pull to ED entrance and come in to let screeners know they are here and ED staff will bring patient out to the car. Verbalized understanding.

## 2019-07-30 ENCOUNTER — Inpatient Hospital Stay (HOSPITAL_BASED_OUTPATIENT_CLINIC_OR_DEPARTMENT_OTHER): Payer: Medicare Other | Admitting: Internal Medicine

## 2019-07-30 ENCOUNTER — Telehealth: Payer: Self-pay | Admitting: *Deleted

## 2019-07-30 ENCOUNTER — Telehealth: Payer: Self-pay | Admitting: Internal Medicine

## 2019-07-30 DIAGNOSIS — C711 Malignant neoplasm of frontal lobe: Secondary | ICD-10-CM | POA: Diagnosis not present

## 2019-07-30 NOTE — Telephone Encounter (Signed)
Received call from Christopher Morris which is with [facility] [phone].  She is requesting a completed DNR as the patient is advising that is his wishes.  Forwarding to Dr. Mickeal Skinner to sign and Christopher Morris will pick up tomorrow 07/31/2019 at front desk on behalf of the patient.

## 2019-07-30 NOTE — Telephone Encounter (Signed)
No los per 1/14,

## 2019-07-30 NOTE — Progress Notes (Signed)
I connected with Christopher Morris on 07/30/19 at  9:30 AM EST by telephone visit and verified that I am speaking with the correct person using two identifiers.  I discussed the limitations, risks, security and privacy concerns of performing an evaluation and management service by telemedicine and the availability of in-person appointments. I also discussed with the patient that there may be a patient responsible charge related to this service. The patient expressed understanding and agreed to proceed.  Other persons participating in the visit and their role in the encounter:  Daughter Christopher Morris  Patient's location:  Home  Provider's location:  Office  Chief Complaint:  Seizure, Hospice arrangements  History of Present Ilness: [name] presented to ED this weekend with generalized shaking and loss of consciousness. He returned to near baseline after several hours.  Currently he is at home, no issues in past 24 hours.  Hospice services are scheduled to be initiated today. Observations: n/a Assessment and Plan: Increase Vimpat to 100mg  BID as palliative preventative measure Follow Up Instructions: Return to clinic via phone or in person only as needed as issues arise during hospice care  I discussed the assessment and treatment plan with the patient.  The patient was provided an opportunity to ask questions and all were answered.  The patient agreed with the plan and demonstrated understanding of the instructions.    The patient was advised to call back or seek an in-person evaluation if the symptoms worsen or if the condition fails to improve as anticipated.  I provided 5-10 minutes of non-face-to-face time during this enocunter.  Ventura Sellers, MD   I provided 10 minutes of non face-to-face telephone visit time during this encounter, and > 50% was spent counseling as documented under my assessment & plan.

## 2019-08-06 ENCOUNTER — Telehealth: Payer: Self-pay | Admitting: *Deleted

## 2019-08-06 ENCOUNTER — Other Ambulatory Visit: Payer: Self-pay | Admitting: Internal Medicine

## 2019-08-06 MED ORDER — DEXAMETHASONE 4 MG PO TABS: 4.0000 mg | ORAL_TABLET | Freq: Every day | ORAL | 3 refills | Status: DC

## 2019-08-06 NOTE — Telephone Encounter (Signed)
Received call from [place] with Hospice [phone].  Patients daughter is complaining of worsening word finding and more issues feeding self and explained to hospice nurse that previously decadron helped in this manner.  Wanted to see if Dr. Mickeal Skinner wanted to prescribe again.  Routed to Dr. Mickeal Skinner for advice.

## 2019-08-06 NOTE — Telephone Encounter (Signed)
Decadron 4 mg daily ordered and Hospice nurse notified.

## 2019-08-07 ENCOUNTER — Other Ambulatory Visit: Payer: Self-pay | Admitting: *Deleted

## 2019-08-07 ENCOUNTER — Other Ambulatory Visit: Payer: Self-pay | Admitting: Medical

## 2019-08-07 MED ORDER — LACOSAMIDE 50 MG PO TABS: 100.0000 mg | ORAL_TABLET | Freq: Two times a day (BID) | ORAL | 0 refills | Status: DC

## 2019-08-07 MED FILL — DEXAMETHASONE 4 MG TABLET: 4 | 30 days supply | Qty: 30 | Fill #0 | Status: TO

## 2019-08-07 MED FILL — DEXAMETHASONE 4 MG TABLET: 4 | 15 days supply | Qty: 15 | Fill #0

## 2019-08-12 ENCOUNTER — Telehealth: Payer: Self-pay | Admitting: *Deleted

## 2019-08-12 NOTE — Telephone Encounter (Signed)
Hospice called to say their pharmacy is recommending Ativan 1 mg TID as a replacement for Vimpat. She has left a message for Christopher Morris's daughter to see if she wants to continue to pay for the Vimpat as she has been doing.   Will call us back on Thursday if she hears from daughter.   Last dose of Vimpat will be given today (Wednesday)

## 2019-08-13 ENCOUNTER — Telehealth: Payer: Self-pay | Admitting: *Deleted

## 2019-08-13 ENCOUNTER — Other Ambulatory Visit: Payer: Self-pay | Admitting: *Deleted

## 2019-08-13 ENCOUNTER — Other Ambulatory Visit: Payer: Self-pay | Admitting: Internal Medicine

## 2019-08-13 MED ORDER — LORAZEPAM 1 MG PO TABS: 1.0000 mg | ORAL_TABLET | Freq: Three times a day (TID) | ORAL | 1 refills | Status: DC

## 2019-08-13 MED ORDER — DEXAMETHASONE 4 MG PO TABS: 4.0000 mg | ORAL_TABLET | Freq: Every day | ORAL | 3 refills | Status: AC

## 2019-08-13 MED ORDER — LORAZEPAM 1 MG PO TABS: 1.0000 mg | ORAL_TABLET | Freq: Three times a day (TID) | ORAL | 1 refills | Status: AC

## 2019-08-13 MED FILL — LORAZEPAM 1 MG TABS: 1 | 15 days supply | Qty: 45 | Fill #0

## 2019-08-13 MED FILL — VIMPAT 50 MG TABLET: 50 | 15 days supply | Qty: 60 | Fill #0

## 2019-08-13 MED FILL — DEXAMETHASONE 4 MG TABLET: 4 | 15 days supply | Qty: 15 | Fill #0

## 2019-08-13 NOTE — Telephone Encounter (Signed)
[place] called to request interim Ativan Rx to be filled to [facility] as Hospice is unable to fill as it is not covered and is pending auth.  Prior Auth request to be faxed to our office to process.  Daughter does not wish to change medication as they have tried several other medications without improvement except for this one.  Took last dose last night.  Rx routed to [facility] by error.  They were notified to cancel.  Rx called into [facility].  Once Prior Josem Kaufmann is completed please return to [facility] outpatient pharmacy and also send copy to [facility] via fax [phone] Attn: Caryl Pina.

## 2019-08-13 NOTE — Telephone Encounter (Signed)
Received denial from insurance company regarding Vimpat.  Daughter is still going to work on obtaining the medication through the [place] since his insurance will not cover this medication.

## 2019-08-21 ENCOUNTER — Telehealth: Payer: Self-pay | Admitting: *Deleted

## 2019-08-21 NOTE — Telephone Encounter (Signed)
"  Christopher Morris, CoverMyMeds [phone]).  Plan response is to resubmit Vimpat 50 mg originally denied.  Need prescription benefit information (PCN) to re-submit."  Benefit information provided was previously used per Christopher Morris. Reached daughter Christopher Morris.   "No new 2021 prescription benefit information.  Hospice will cover a 15-day supply of Vimpat.  Prescriptions need to be written only for a 15-day supply."    Asked Christopher Morris to return call with changes, needs or questions CoverMyMeds notified.  Report closing this request.

## 2019-08-23 IMAGING — MR MR HEAD WO/W CM
10 of 13 series · 37 of 48 positions shown · IV contrast (gadavist)
Comparison: Most recent MR 05/16/2018.

CLINICAL DATA: Frontal glioblastoma.  Continued surveillance.

EXAM:
MRI HEAD WITHOUT AND WITH CONTRAST
TECHNIQUE: Multiplanar, multiecho pulse sequences of the brain and surrounding
structures were obtained without and with intravenous contrast.
CONTRAST:  Gadavist 10 mL.

[Series 3: T1 · sagittal · 5.0mm · 0.47mm/px · 1 of 24 slices shown]
[im 1/24]
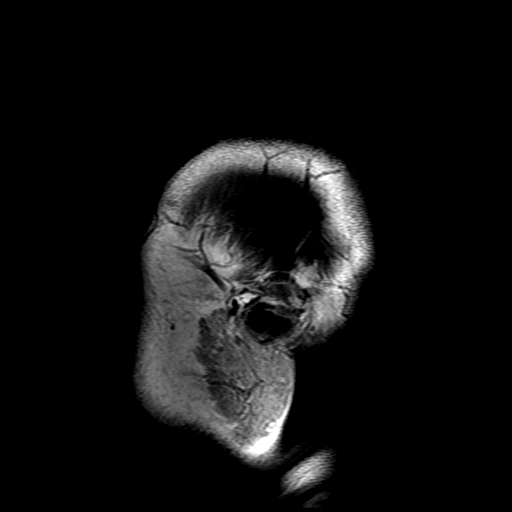

[Series 4: DWI · axial · 3.0mm · 1.09mm/px · z∈[-44,+97]mm · 8 of 96 slices shown (1 of 4)]
[im 1/96]
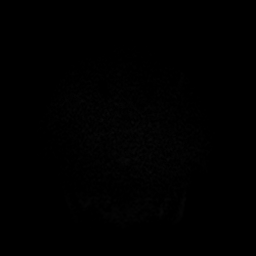
[im 14/96]
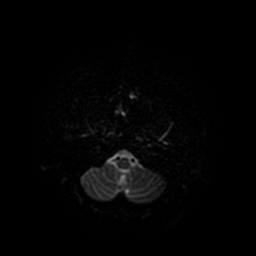
[im 28/96]
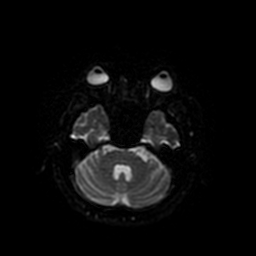
[im 41/96]
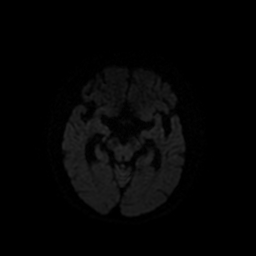
[im 55/96]
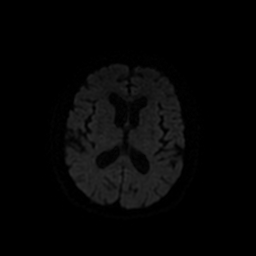
[im 68/96]
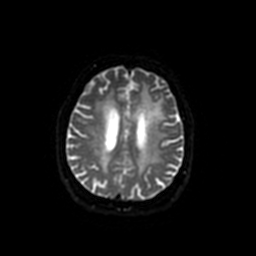
[im 82/96]
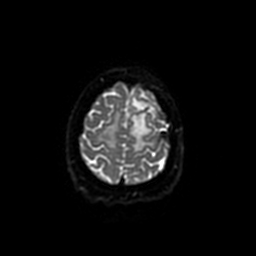
[im 96/96]
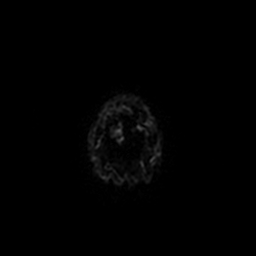

[Series 5: T2 · axial · 5.0mm · 0.43mm/px · z∈[-46,+94]mm · 2 of 21 slices shown]
[im 1/21]
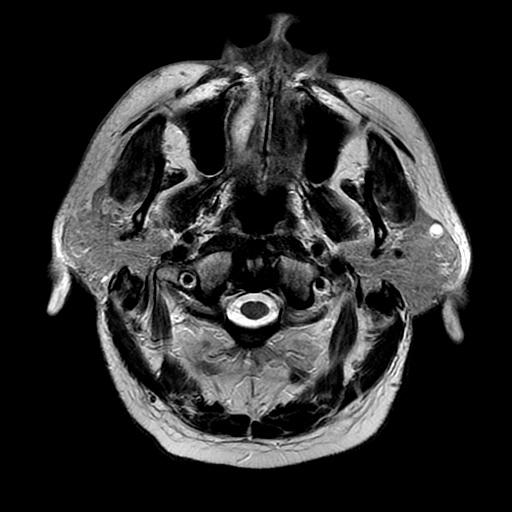
[im 21/21]
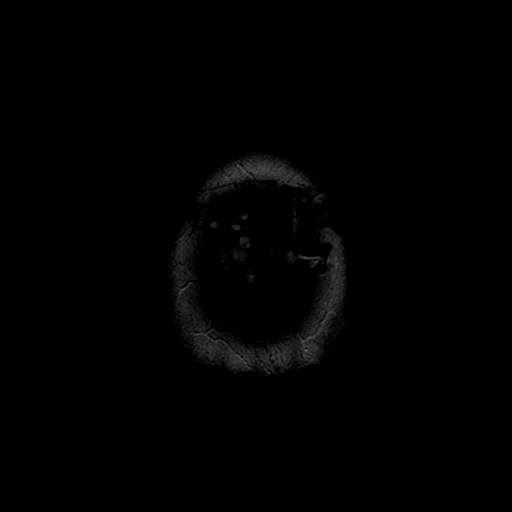

[Series 6: FLAIR · axial · 3.0mm · 0.43mm/px · z∈[-48,+96]mm · 2 of 25 slices shown]
[im 1/25]
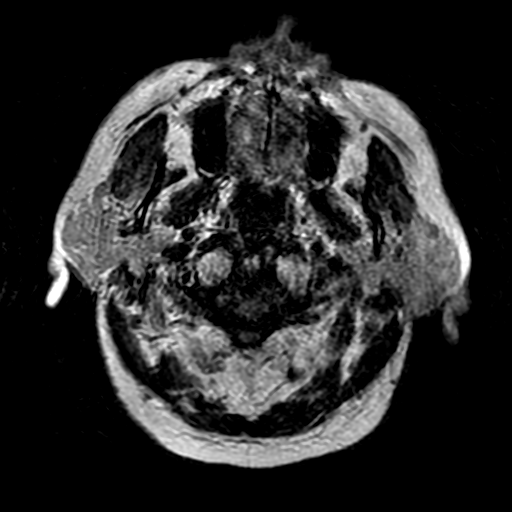
[im 25/25]
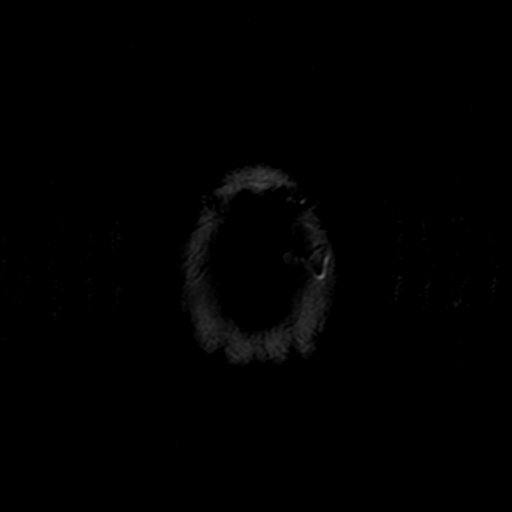

[Series 7: DWI · coronal · 3.0mm · 1.09mm/px · 9 of 110 slices shown (2 of 4)]
[im 1/110]
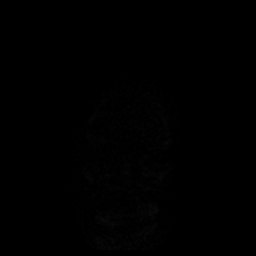
[im 14/110]
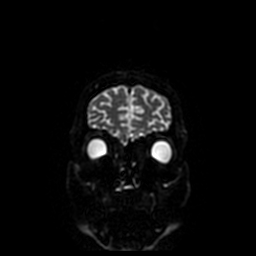
[im 28/110]
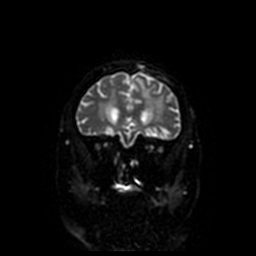
[im 41/110]
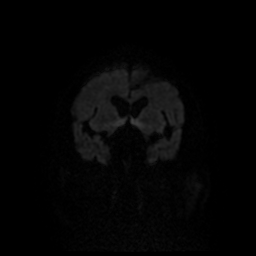
[im 55/110]
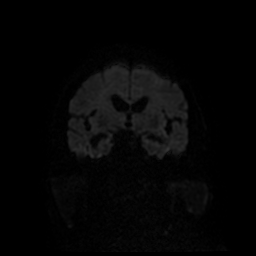
[im 69/110]
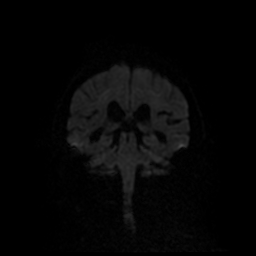
[im 82/110]
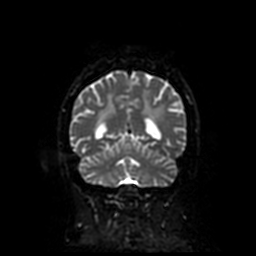
[im 96/110]
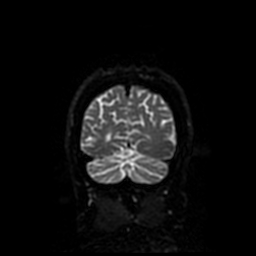
[im 110/110]
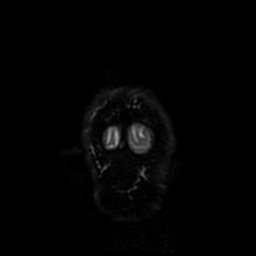

[Series 10: T2 post-contrast · coronal · 5.0mm · 0.45mm/px · 2 of 26 slices shown]
[im 1/26]
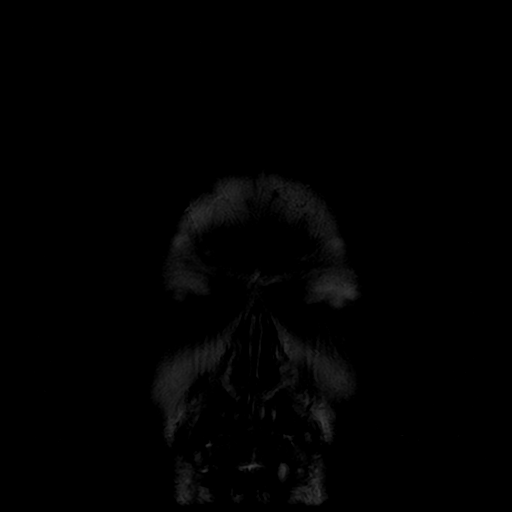
[im 26/26]
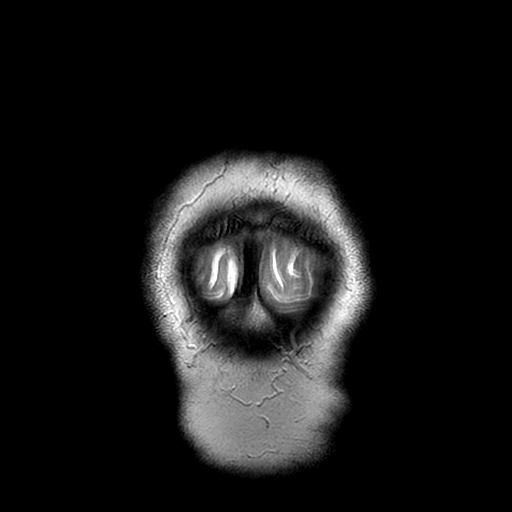

[Series 12: T1 post-contrast · coronal · 5.0mm · 0.45mm/px · 2 of 26 slices shown (1 of 2)]
[im 1/26]
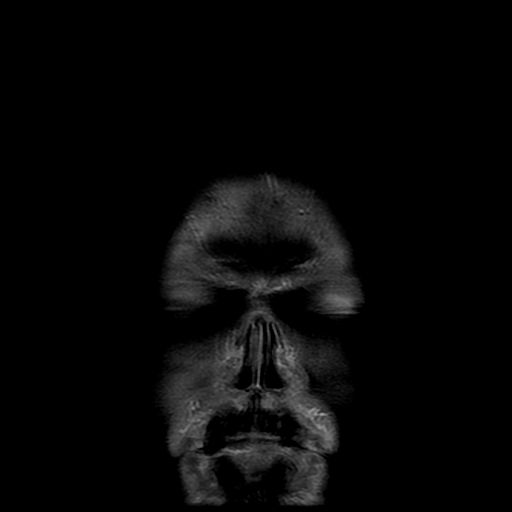
[im 26/26]
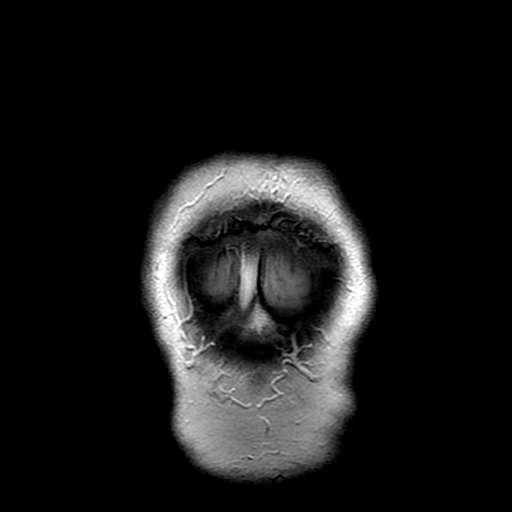

[Series 13: T1 post-contrast · sagittal · 5.0mm · 0.47mm/px · 2 of 24 slices shown (2 of 2)]
[im 1/24]
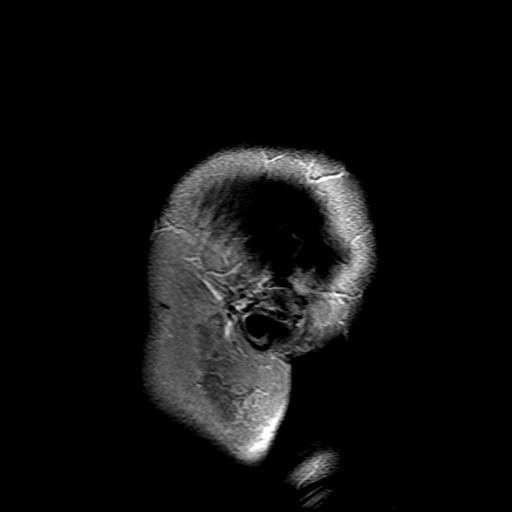
[im 24/24]
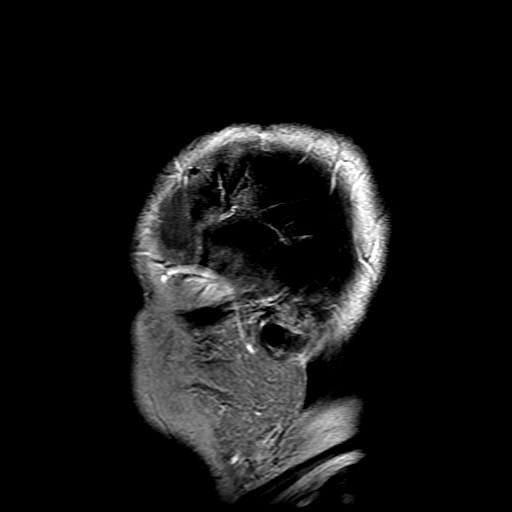

[Series 400: DWI · axial · 3.0mm · 1.09mm/px · z∈[-44,+97]mm · 4 of 48 slices shown (3 of 4)]
[im 1/48]
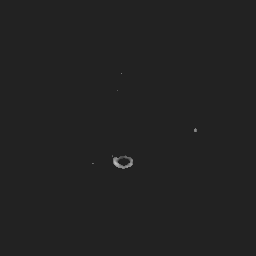
[im 16/48]
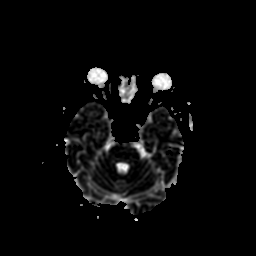
[im 32/48]
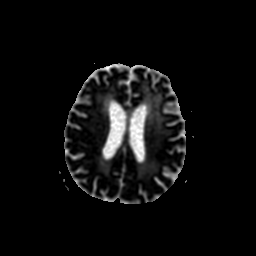
[im 48/48]
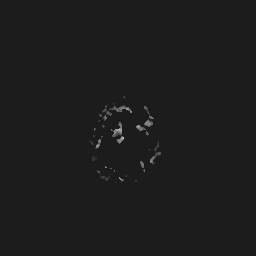

[Series 700: DWI · coronal · 3.0mm · 1.09mm/px · 5 of 55 slices shown (4 of 4)]
[im 1/55]
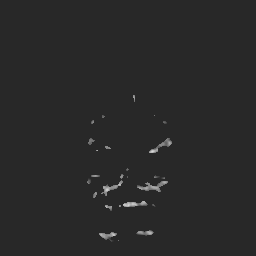
[im 14/55]
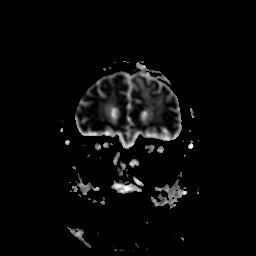
[im 28/55]
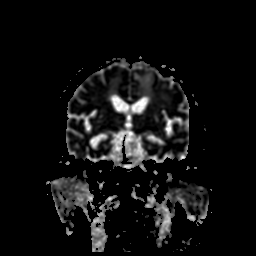
[im 41/55]
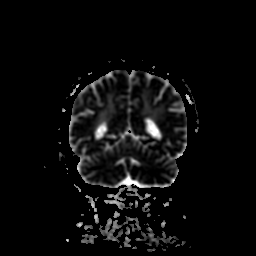
[im 55/55]
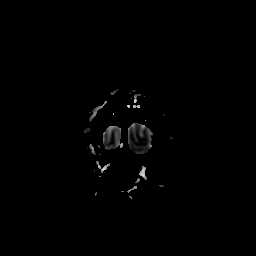

[37 of 48 positions shown; findings below may reference images not displayed]

FINDINGS: Brain: Slightly improved, with decreased measurable cross-section of
enhancing tumor representing neovascularity of a LEFT frontal
parasagittal glioblastoma multiforme, 22 x 31 mm as compared with 23
x 32 mm most recent priors. Adjacent prominent T2 and FLAIR
hyperintensity not significantly changed. More low level, confluent
subcortical and periventricular white matter signal abnormality,
also stable. Chronic blood products line the surgical bed as well as
adjacent to/within the tumor.

Vascular: Normal flow voids.

Skull and upper cervical spine: Normal marrow signal. Stable LEFT
craniotomy.

Sinuses/Orbits: Negative.

Other: None.
IMPRESSION: Slightly decreased cross-section of enhancing tumor, no other
significant interval change.

## 2019-08-31 ENCOUNTER — Other Ambulatory Visit: Payer: Self-pay | Admitting: *Deleted

## 2019-08-31 MED ORDER — APIXABAN 5 MG PO TABS: 5.0000 mg | ORAL_TABLET | Freq: Two times a day (BID) | ORAL | 0 refills | Status: DC

## 2019-08-31 MED FILL — ELIQUIS 5 MG TABLET: 5 | 15 days supply | Qty: 30 | Fill #0

## 2019-09-14 MED FILL — DEXAMETHASONE 4 MG TABLET: 4 | 15 days supply | Qty: 15 | Fill #1

## 2019-10-02 ENCOUNTER — Telehealth: Payer: Self-pay | Admitting: *Deleted

## 2019-10-02 MED FILL — levETIRAcetam 1000 MG TABS: 1000 | 15 days supply | Qty: 45 | Fill #0

## 2019-10-02 NOTE — Telephone Encounter (Signed)
Received call from pt's Hospice nurse, Clarise Cruz.  She states that pt had 2 seizures last night and was given po ativan with resolution of seizures. No further seizure activity. Clarise Cruz is asking if it is ok to give pt Ativan for seizure activity in the future. This was approved by Dr. Mickeal Skinner.  Pt continues to take Vimpat and Keppra. Kepprs prescription refilled per the hospice MD. Pt at baseline today. No evidence of aspiration, no further seizures at this time

## 2019-10-05 MED FILL — ELIQUIS 5 MG TABLET: 5 | 15 days supply | Qty: 30 | Fill #1

## 2019-10-05 MED FILL — DEXAMETHASONE 4 MG TABLET: 4 | 15 days supply | Qty: 15 | Fill #2

## 2019-10-19 ENCOUNTER — Other Ambulatory Visit: Payer: Self-pay | Admitting: *Deleted

## 2019-10-19 ENCOUNTER — Other Ambulatory Visit: Payer: Self-pay | Admitting: Internal Medicine

## 2019-10-19 MED ORDER — APIXABAN 5 MG PO TABS: 5.0000 mg | ORAL_TABLET | Freq: Two times a day (BID) | ORAL | 3 refills | Status: AC

## 2019-10-19 MED ORDER — LACOSAMIDE 50 MG PO TABS: 100.0000 mg | ORAL_TABLET | Freq: Two times a day (BID) | ORAL | 3 refills | Status: AC

## 2019-10-19 MED ORDER — LEVETIRACETAM 250 MG PO TABS: 1500.0000 mg | ORAL_TABLET | Freq: Two times a day (BID) | ORAL | 6 refills | Status: AC

## 2019-10-19 MED FILL — levETIRAcetam 250 MG TABS: 250 | 10 days supply | Qty: 120 | Fill #0

## 2019-10-19 MED FILL — VIMPAT 50 MG TABLET: 50 | 15 days supply | Qty: 60 | Fill #0

## 2019-10-21 MED FILL — ELIQUIS 5 MG TABLET: 5 | 15 days supply | Qty: 30 | Fill #0

## 2019-10-21 MED FILL — DEXAMETHASONE 4 MG TABLET: 4 | 15 days supply | Qty: 15 | Fill #3

## 2019-10-29 MED FILL — traMADol HCL 50 MG TABS: 50 | 7 days supply | Qty: 90 | Fill #0

## 2019-11-01 IMAGING — MR MRI HEAD WITHOUT AND WITH CONTRAST
10 of 11 series · 26 of 48 positions shown · IV contrast (Yes)
Comparison: Brain MRI 07/25/2018 and earlier.

CLINICAL DATA: 77-year-old male with frontal lobe glioblastoma
treated with resection and IMRT in 4329. Continues on Temozolomide.

EXAM:
MRI HEAD WITHOUT AND WITH CONTRAST
TECHNIQUE: Multiplanar, multiecho pulse sequences of the brain and surrounding
structures were obtained without and with intravenous contrast.
CONTRAST:  9 milliliters Gadavist

[Series 3: DWI · axial · 3.0mm · 1.09mm/px · z∈[-22,+134]mm · 7 of 106 slices shown (1 of 2)]
[im 1/106]
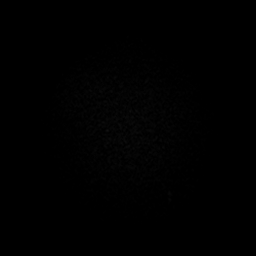
[im 18/106]
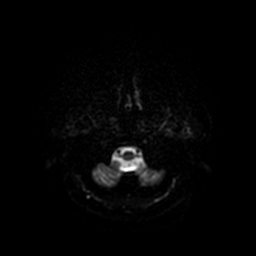
[im 36/106]
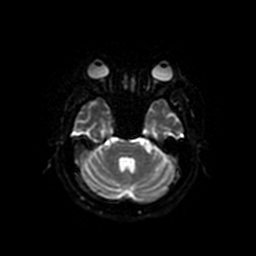
[im 53/106]
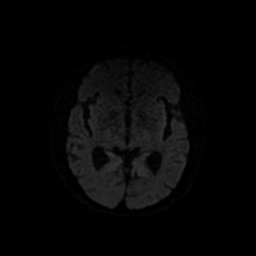
[im 71/106]
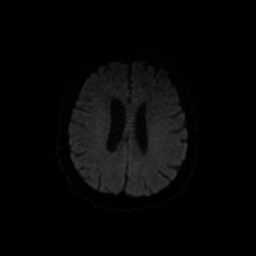
[im 88/106]
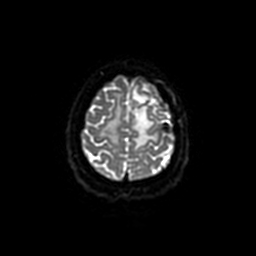
[im 106/106]
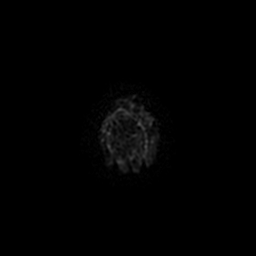

[Series 4: T1 · sagittal · 5.0mm · 0.47mm/px · 1 of 24 slices shown]
[im 1/24]
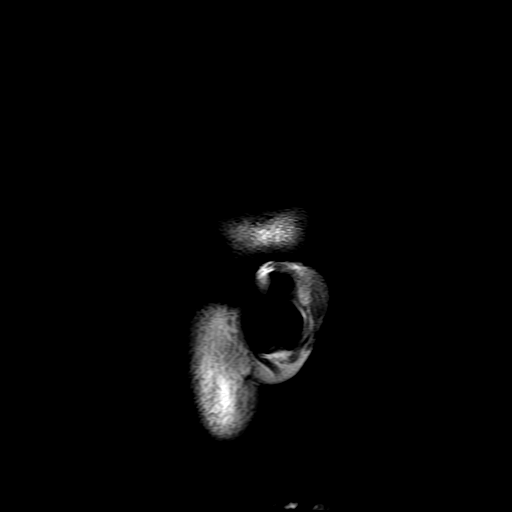

[Series 5: T2 · axial · 5.0mm · 0.43mm/px · z∈[-35,+125]mm · 2 of 24 slices shown]
[im 1/24]
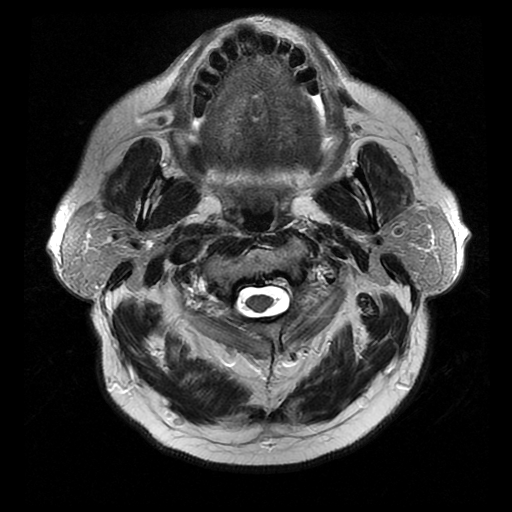
[im 24/24]
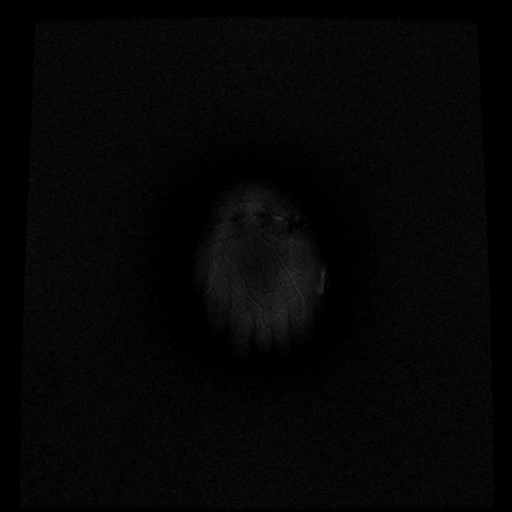

[Series 6: FLAIR · axial · 3.0mm · 0.43mm/px · z∈[-33,+122]mm · 2 of 27 slices shown]
[im 1/27]
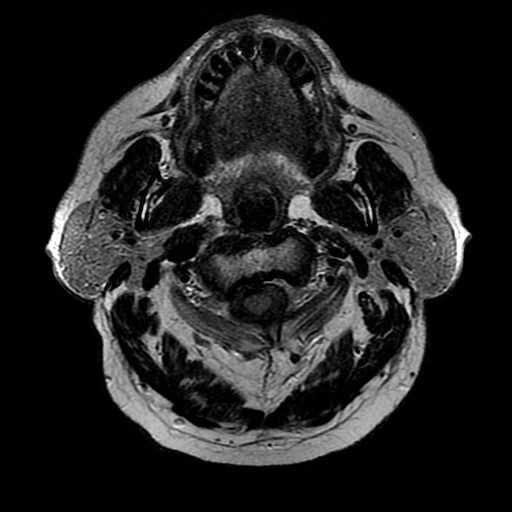
[im 27/27]
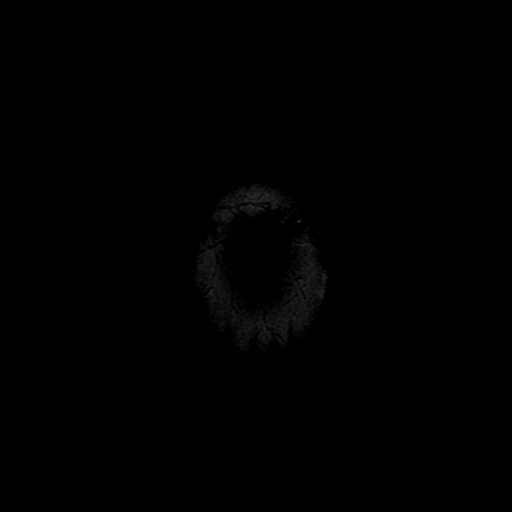

[Series 7: ax mpgr · axial · 3.0mm · 0.43mm/px · z∈[-33,+121]mm · 2 of 32 slices shown]
[im 1/32]
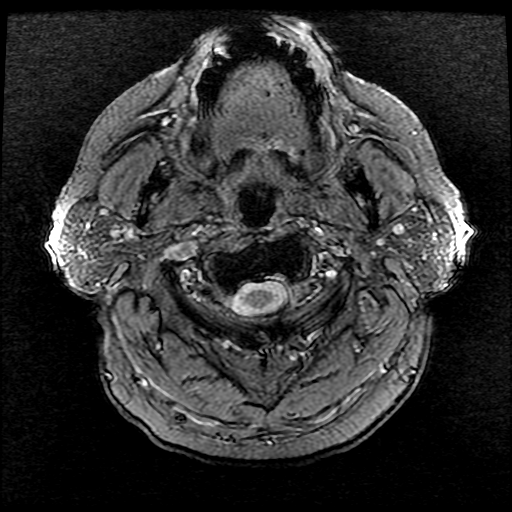
[im 32/32]
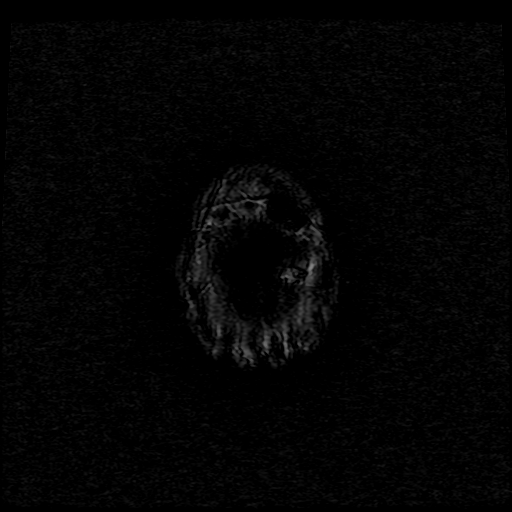

[Series 8: ax fspgr irp · axial · 1.0mm · 0.47mm/px · z∈[-24,+5]mm · 2 of 162 slices shown]
[im 1/162]
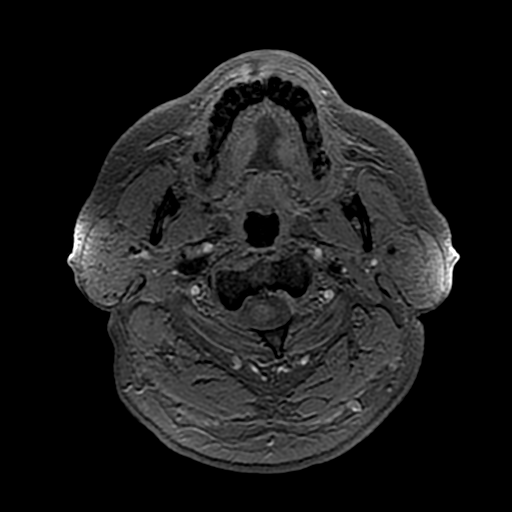
[im 30/162]
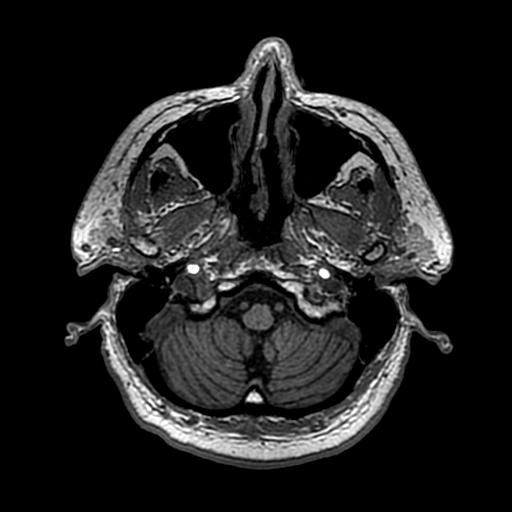

[Series 9: T2 post-contrast · coronal · 5.0mm · 0.45mm/px · 2 of 28 slices shown]
[im 1/28]
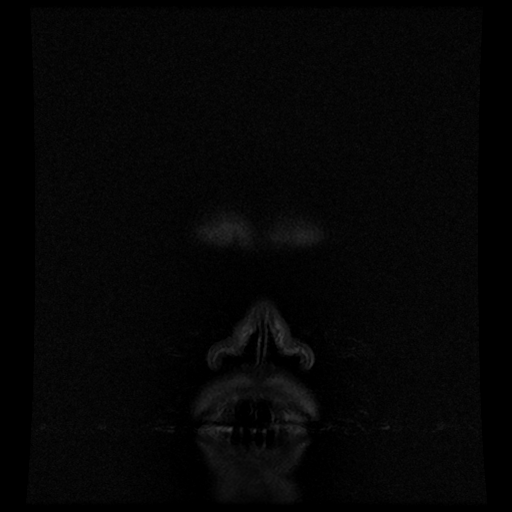
[im 28/28]
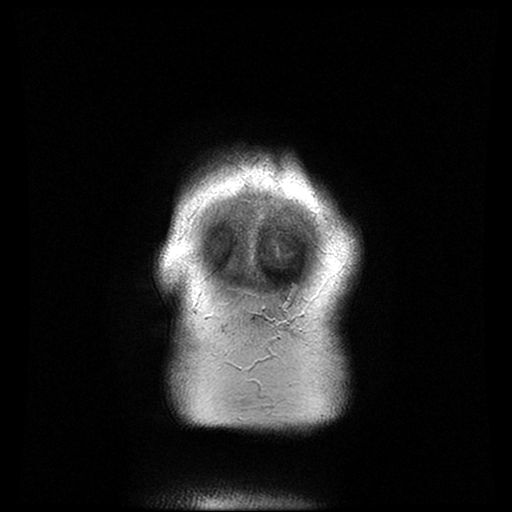

[Series 11: T1 post-contrast · coronal · 5.0mm · 0.43mm/px · 2 of 28 slices shown (1 of 2)]
[im 1/28]
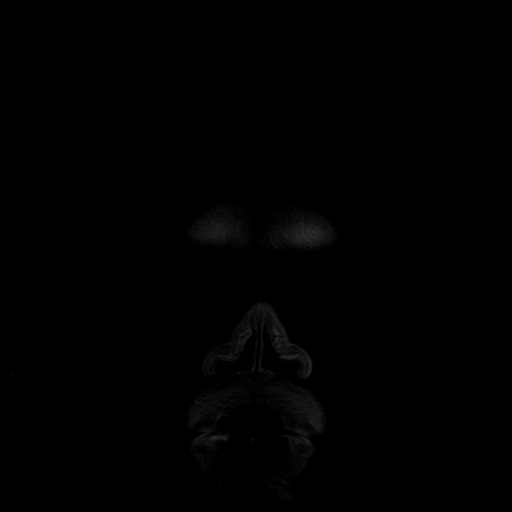
[im 28/28]
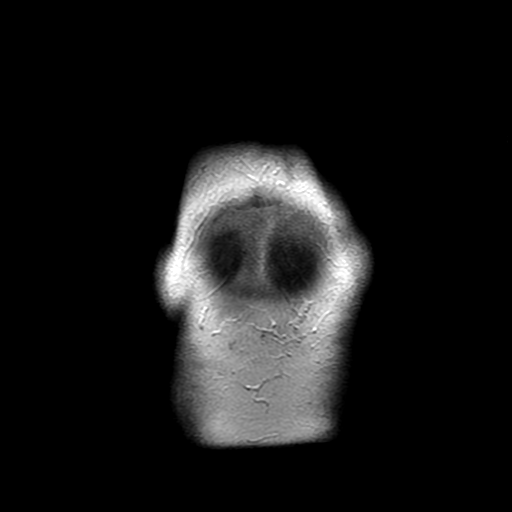

[Series 12: T1 post-contrast · sagittal · 5.0mm · 0.47mm/px · 2 of 24 slices shown (2 of 2)]
[im 1/24]
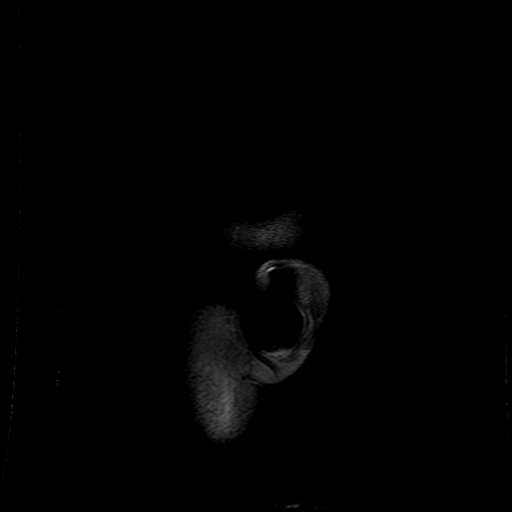
[im 24/24]
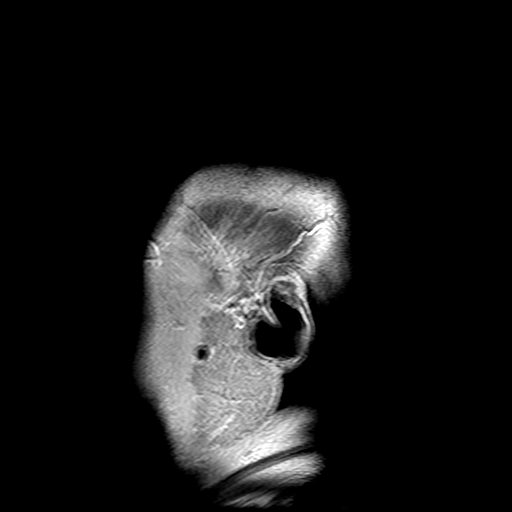

[Series 300: DWI · axial · 3.0mm · 1.09mm/px · z∈[-22,+134]mm · 4 of 53 slices shown (2 of 2)]
[im 1/53]
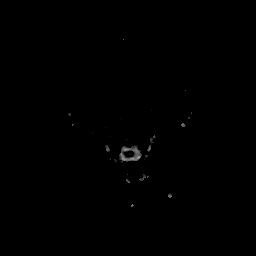
[im 18/53]
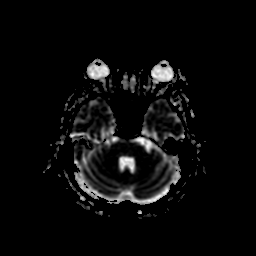
[im 35/53]
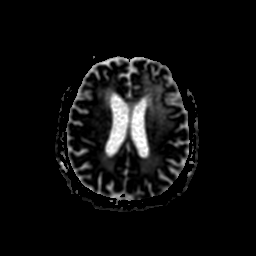
[im 53/53]
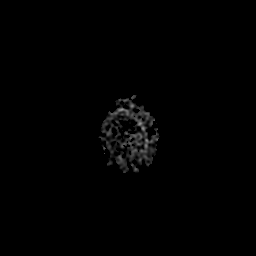

[26 of 48 positions shown; findings below may reference images not displayed]

FINDINGS: Brain: Irregular area of confluent enhancement in the left superior
frontal lobe today encompasses 35 x 27 x 31 millimeters (AP by
transverse by CC). This compares to approximately 33 by 27 x 27
millimeters when measured at the same level in [REDACTED], and also
appears increased compared to May 2018.

Furthermore, enhancement is now tracking toward the corpus callosum
on series 11, image 17.

Appearance on diffusion is stable.

White matter T2 and FLAIR hyperintensity in the region has mildly
increased (series 6, image 21 today versus series 6, image [REDACTED]). Corpus callosum T2 and FLAIR hyperintensity has also
increased on series 9, image 17. No significant mass effect.
Abnormal T2 and FLAIR signal tracks into the left deep gray matter
as before.

Stable overlying left vertex craniotomy.

Contralateral confluent white matter T2 and FLAIR hyperintensity in
the right hemisphere is stable. No other abnormal intracranial
enhancement.

No restricted diffusion to suggest acute infarction. No midline
shift, ventriculomegaly, or acute intracranial hemorrhage.
Cervicomedullary junction and pituitary are stable and negative.

Vascular: Major intracranial vascular flow voids are stable. The
major dural venous sinuses are enhancing and appear to be patent.

Skull and upper cervical spine: Stable cervical spine degeneration.
Stable bone marrow signal.

Sinuses/Orbits: Stable and negative.

Other: Mastoids remain clear. Visible internal auditory structures
appear normal. There is a small nonenhancing T2 area in the left
parotid gland which appears benign on series 5, image 4.
IMPRESSION: 1. Mild progression of left superior frontal gyrus abnormal
enhancement and regional T2/FLAIR abnormality since [REDACTED]
suspicious for disease progression. But no associated mass effect.
2. No new intracranial abnormality.

## 2019-11-03 MED FILL — levETIRAcetam 250 MG TABS: 250 | 10 days supply | Qty: 120 | Fill #1

## 2019-11-03 MED FILL — ELIQUIS 5 MG TABLET: 5 | 15 days supply | Qty: 30 | Fill #1

## 2019-11-03 MED FILL — VIMPAT 50 MG TABLET: 50 | 15 days supply | Qty: 60 | Fill #1

## 2019-11-06 MED FILL — DEXAMETHASONE 4 MG TABLET: 4 | 15 days supply | Qty: 15 | Fill #4

## 2019-11-12 MED FILL — CIPROFLOXACIN HCL 500 MG TA: 500 | 5 days supply | Qty: 10 | Fill #0

## 2019-11-12 MED FILL — SM MAGNESIUM CITRATE 1.745: 1.745 | 1 days supply | Qty: 296 | Fill #0

## 2019-11-18 MED FILL — traMADol HCL 50 MG TABS: 50 | 7 days supply | Qty: 90 | Fill #1

## 2019-12-10 MED FILL — LORazepam 1 MG TABS: 1 | 15 days supply | Qty: 90 | Fill #0

## 2019-12-10 MED FILL — OSCIMIN SL 0.125 MG TABLET: 0.125 | 1 days supply | Qty: 10 | Fill #0

## 2019-12-10 MED FILL — MORPHINE SULFATE (CONCENTRA: 20 | 2 days supply | Qty: 30 | Fill #0

## 2019-12-15 ENCOUNTER — Telehealth: Payer: Self-pay | Admitting: *Deleted

## 2019-12-15 NOTE — Telephone Encounter (Signed)
Received call from [place] with hospice.  Patient is nearing end of life.  She will keep [place] posted.

## 2019-12-16 IMAGING — MR MRI HEAD WITHOUT AND WITH CONTRAST
11 of 13 series · 32 of 48 positions shown · IV contrast (gadavist)
Comparison: 10/03/2018

CLINICAL DATA: Follow-up glioblastoma

EXAM:
MRI HEAD WITHOUT AND WITH CONTRAST
TECHNIQUE: Multiplanar, multiecho pulse sequences of the brain and surrounding
structures were obtained without and with intravenous contrast.
CONTRAST:  10 cc Gadavist intravenous

[Series 3: DWI · axial · 3.0mm · 1.09mm/px · z∈[-62,+86]mm · 6 of 102 slices shown (1 of 4)]
[im 1/102]
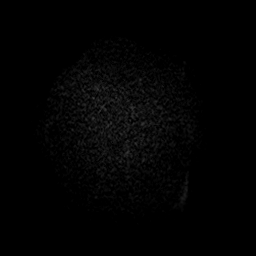
[im 21/102]
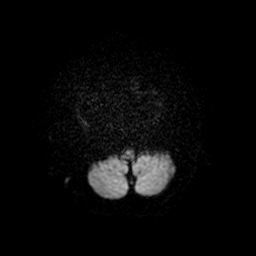
[im 41/102]
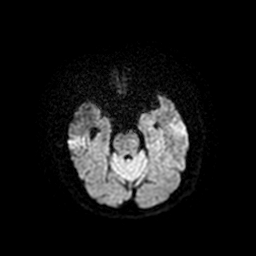
[im 61/102]
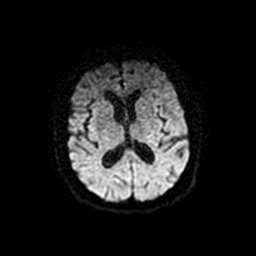
[im 81/102]
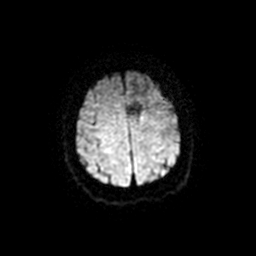
[im 102/102]
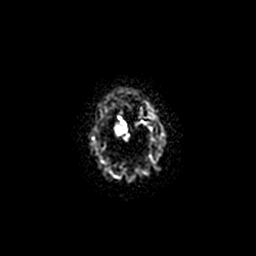

[Series 4: T1 · sagittal · 5.0mm · 0.47mm/px · 1 of 24 slices shown]
[im 1/24]
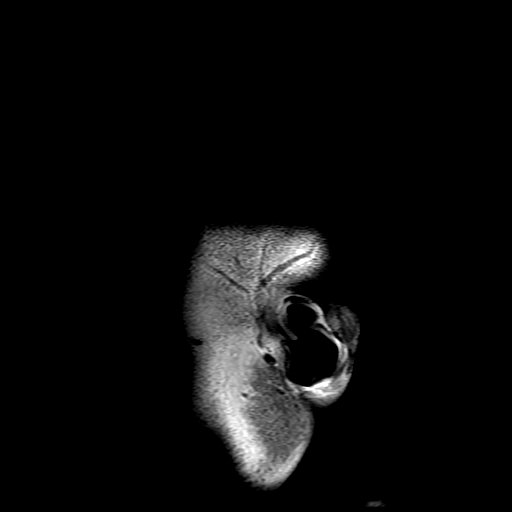

[Series 5: T2 · axial · 5.0mm · 0.47mm/px · z∈[-54,+90]mm · 2 of 22 slices shown]
[im 1/22]
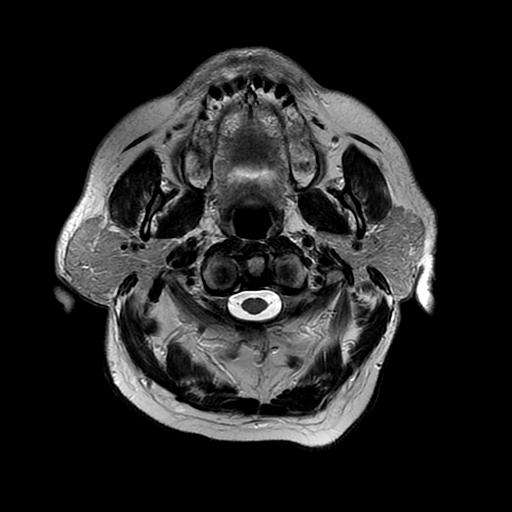
[im 22/22]
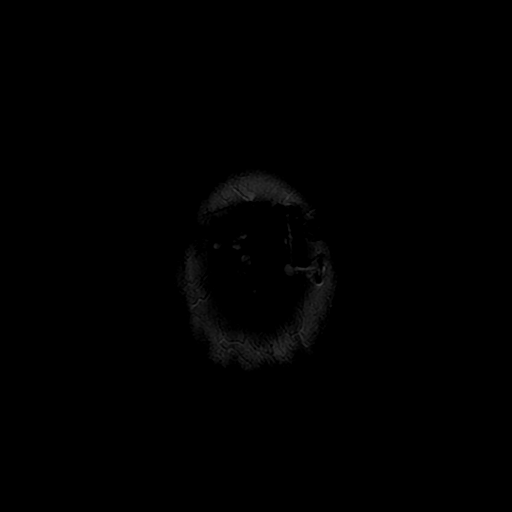

[Series 6: FLAIR · axial · 3.0mm · 0.43mm/px · z∈[-55,+92]mm · 2 of 26 slices shown]
[im 1/26]
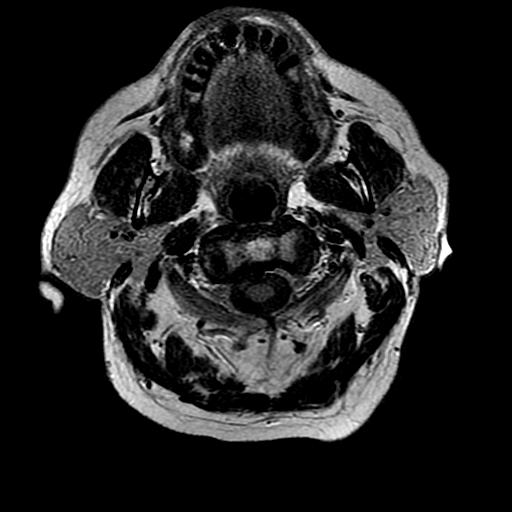
[im 26/26]
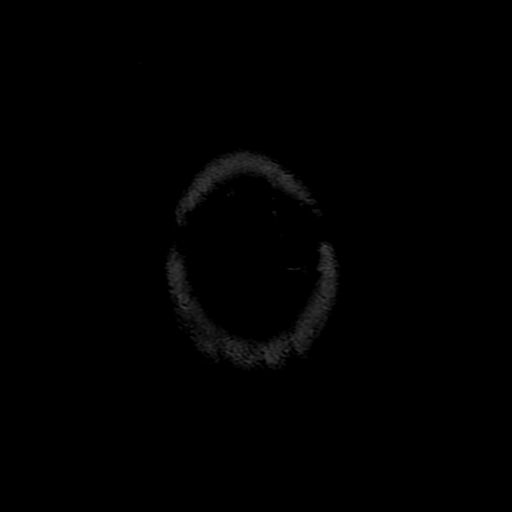

[Series 7: ax mpgr · axial · 3.0mm · 0.43mm/px · z∈[-59,+87]mm · 2 of 31 slices shown]
[im 1/31]
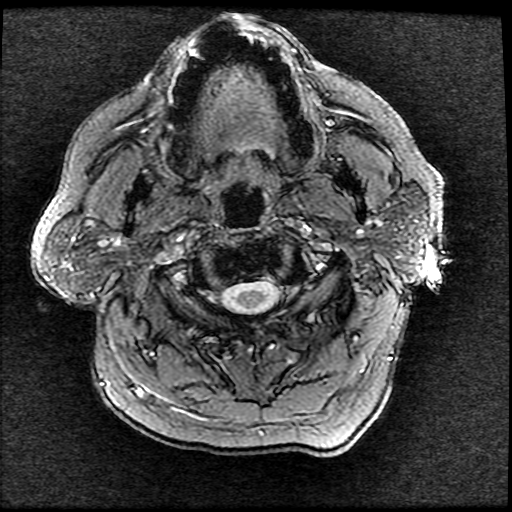
[im 31/31]
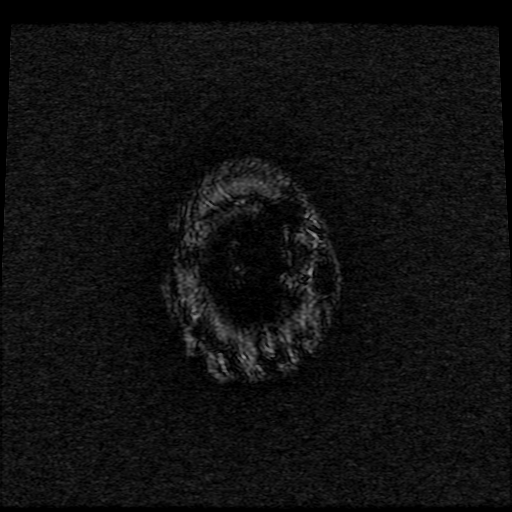

[Series 9: DWI · coronal · 4.0mm · 1.09mm/px · 6 of 78 slices shown (2 of 4)]
[im 1/78]
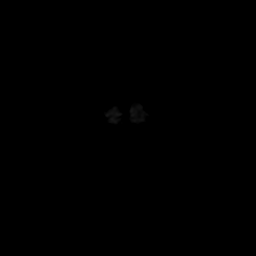
[im 16/78]
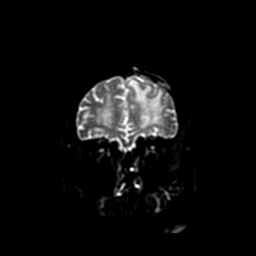
[im 31/78]
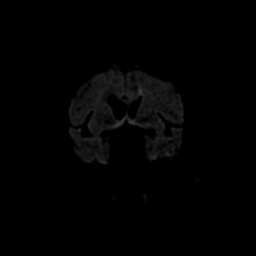
[im 47/78]
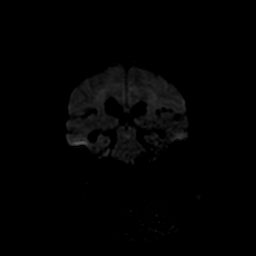
[im 62/78]
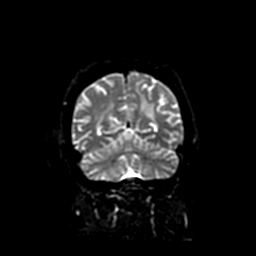
[im 78/78]
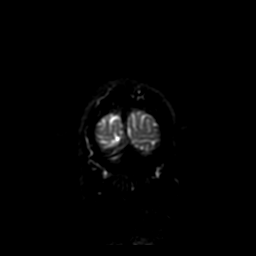

[Series 10: T2 post-contrast · coronal · 5.0mm · 0.47mm/px · 2 of 24 slices shown]
[im 1/24]
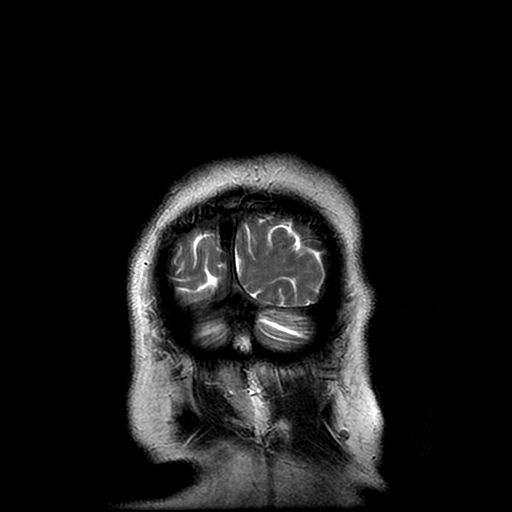
[im 24/24]
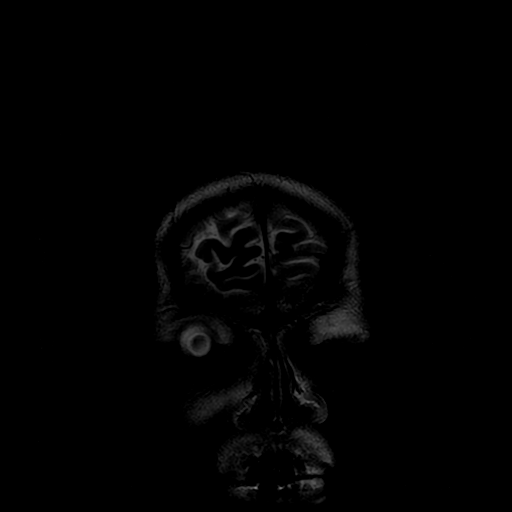

[Series 12: T1 post-contrast · coronal · 5.0mm · 0.47mm/px · 2 of 24 slices shown (1 of 2)]
[im 1/24]
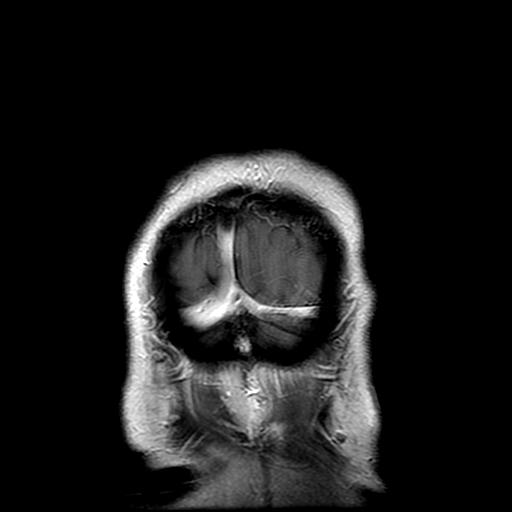
[im 24/24]
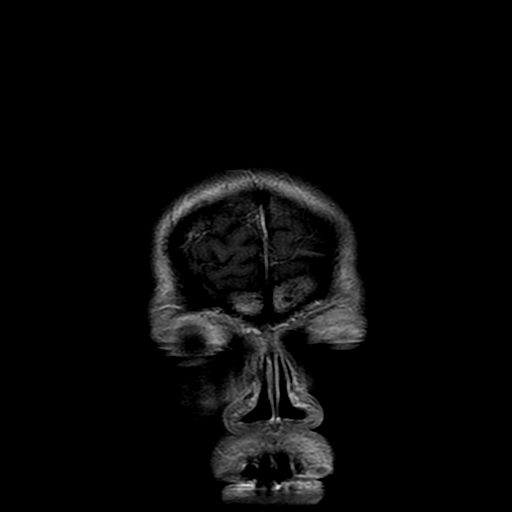

[Series 13: T1 post-contrast · sagittal · 5.0mm · 0.47mm/px · 2 of 24 slices shown (2 of 2)]
[im 1/24]
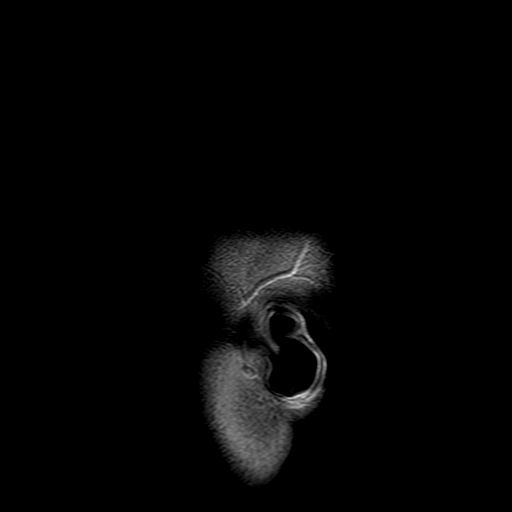
[im 24/24]
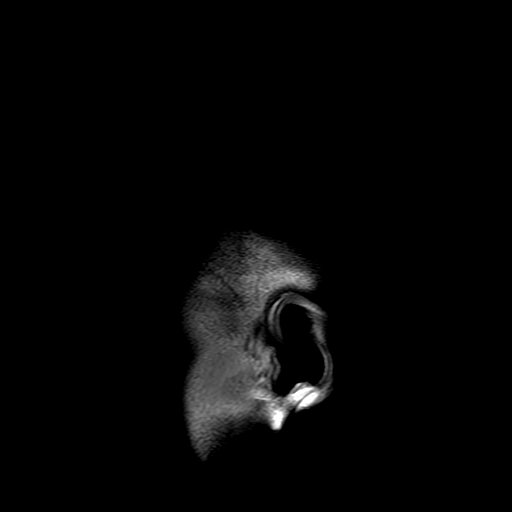

[Series 300: DWI · axial · 3.0mm · 1.09mm/px · z∈[-62,+86]mm · 4 of 51 slices shown (3 of 4)]
[im 1/51]
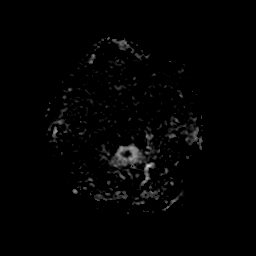
[im 17/51]
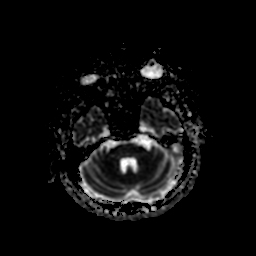
[im 34/51]
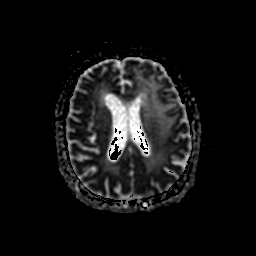
[im 51/51]
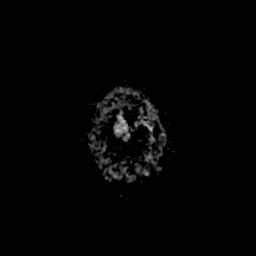

[Series 900: DWI · coronal · 4.0mm · 1.09mm/px · 3 of 39 slices shown (4 of 4)]
[im 1/39]
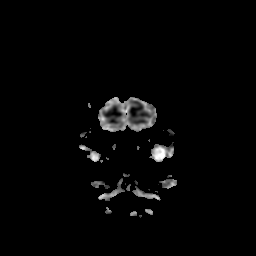
[im 20/39]
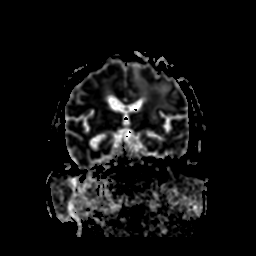
[im 39/39]
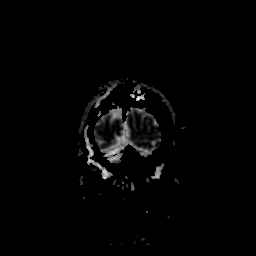

[32 of 48 positions shown; findings below may reference images not displayed]

FINDINGS: Brain: Left frontal glioma status post resection. Irregular
enhancement at the level of the resection cavity encompassing the
superior margin of the left lateral ventricle. The extent of
enhancement is unchanged, measuring up to 3.5 cm maximum on axial
slices. This area includes a smaller area (up to 12 mm) of
restricted diffusion and T2 hypointensity, this area particularly
concerning for enhancing tumor. There is extensive FLAIR
hyperintensity in the left more than right cerebrum with mild
increase in extent at the level of the left frontal operculum. There
is effacement of sulci on the left more consistent with edema and or
nonenhancing tumor than small-vessel changes from treatment. The
left lateral ventricle is partially effaced compared to the right,
subtly increased from before.

No infarct, hydrocephalus, or shift

Vascular: Major flow voids are preserved

Skull and upper cervical spine: Unremarkable craniotomy changes

Sinuses/Orbits: Negative
IMPRESSION: 1. Left frontal glioblastoma with unchanged enhancing portion. There
is a smaller area within this enhancement that is T2 dark and
restricts diffusion, this area in particular is suspicious for
enhancing disease.
2. Extensive T2 signal in the left more than right cerebrum with
mild increase extent and associated mass effect on the left.

## 2019-12-24 ENCOUNTER — Telehealth: Payer: Self-pay | Admitting: *Deleted

## 2020-01-14 NOTE — Telephone Encounter (Signed)
Received notification from [facility] and Hospice that patient passed away this morning Jan 13, 2020 @ 10:02 am.

## 2020-01-14 DEATH — deceased

## 2020-01-27 IMAGING — MR MRI HEAD WITHOUT AND WITH CONTRAST
13 of 15 series · 40 of 48 positions shown · IV contrast (gadavist)
Comparison: Head CT, CTA, and CTP 12/29/2018.  Head MRI 11/17/2018.

CLINICAL DATA: Fall with altered mental status, aphasia, and right
upper extremity posturing. History of glioblastoma.

EXAM:
MRI HEAD WITHOUT AND WITH CONTRAST
TECHNIQUE: Multiplanar, multiecho pulse sequences of the brain and surrounding
structures were obtained without and with intravenous contrast.
CONTRAST:  9 mL Gadavist

[Series 5: DWI · axial · 3.0mm · 0.88mm/px · z∈[-60,+75]mm · 8 of 92 slices shown (1 of 4)]
[im 1/92]
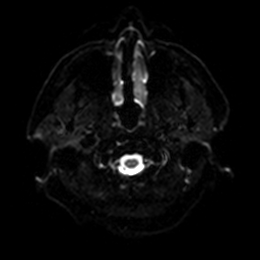
[im 14/92]
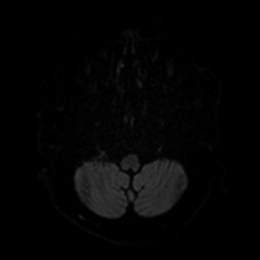
[im 27/92]
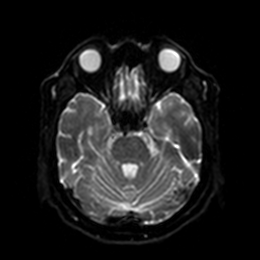
[im 40/92]
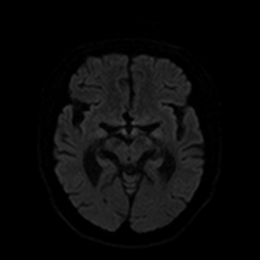
[im 53/92]
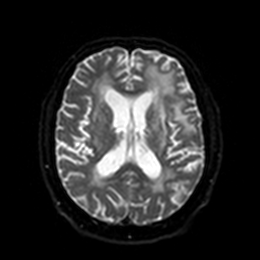
[im 66/92]
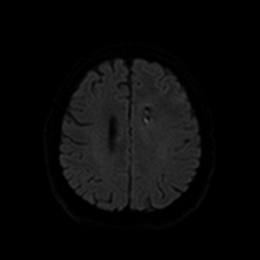
[im 79/92]
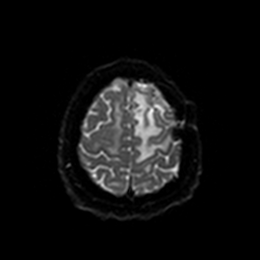
[im 92/92]
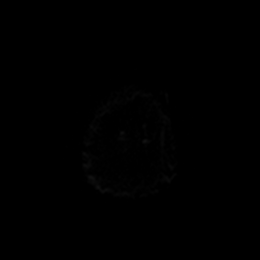

[Series 6: DWI · axial · 3.0mm · 0.88mm/px · z∈[-60,+75]mm · 3 of 46 slices shown (2 of 4)]
[im 1/46]
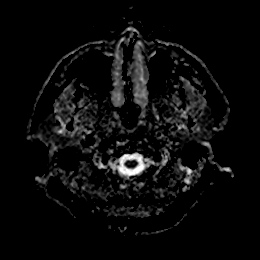
[im 23/46]
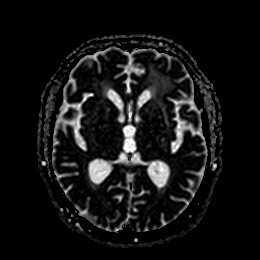
[im 46/46]
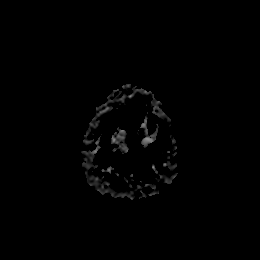

[Series 7: DWI · coronal · 4.0mm · 0.88mm/px · 5 of 68 slices shown (3 of 4)]
[im 1/68]
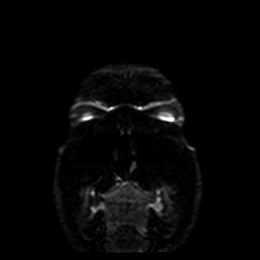
[im 17/68]
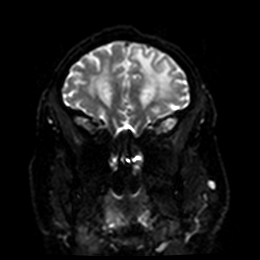
[im 34/68]
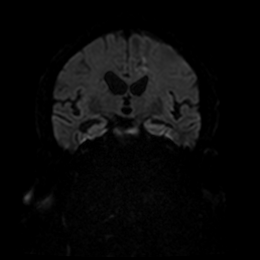
[im 51/68]
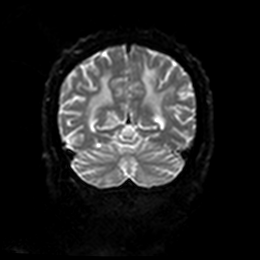
[im 68/68]
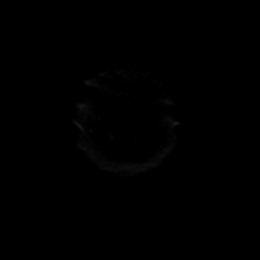

[Series 8: DWI · coronal · 4.0mm · 0.88mm/px · 2 of 34 slices shown (4 of 4)]
[im 1/34]
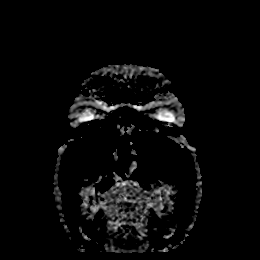
[im 34/34]
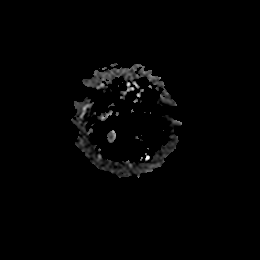

[Series 9: T1 · sagittal · 5.0mm · 0.75mm/px · 2 of 25 slices shown]
[im 1/25]
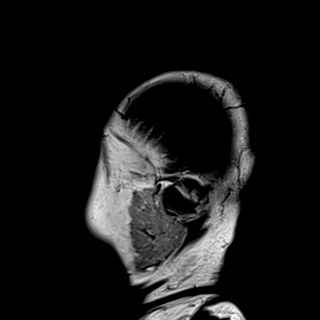
[im 25/25]
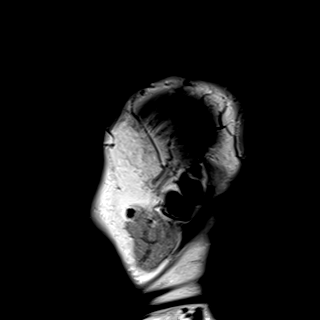

[Series 10: T2 · axial · 5.0mm · 0.72mm/px · z∈[-65,+79]mm · 2 of 25 slices shown]
[im 1/25]
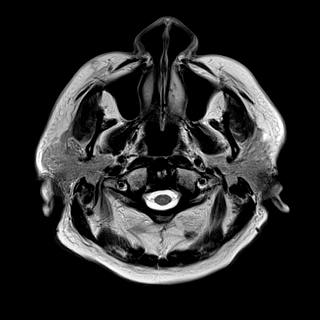
[im 25/25]
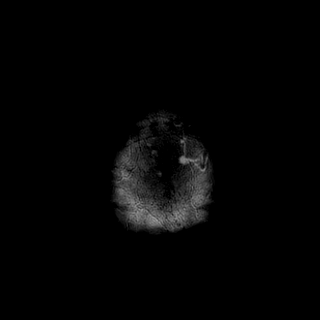

[Series 11: FLAIR · axial · 5.0mm · 0.45mm/px · z∈[-64,+80]mm · 2 of 25 slices shown (1 of 2)]
[im 1/25]
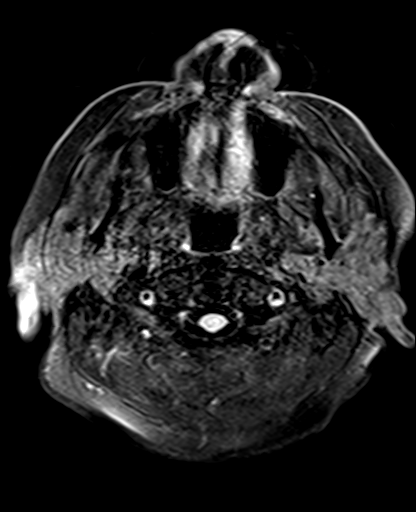
[im 25/25]
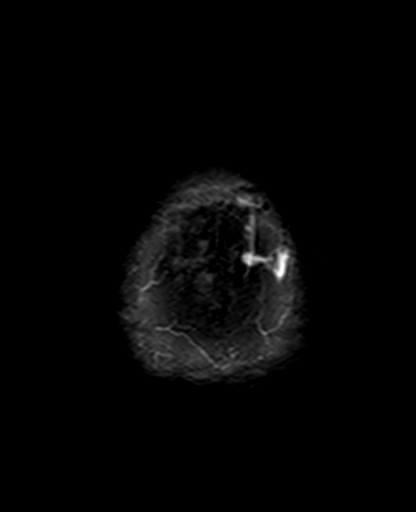

[Series 13: pha_images · axial · 3.0mm · 0.90mm/px · z∈[-85,+92]mm · 4 of 60 slices shown]
[im 1/60]
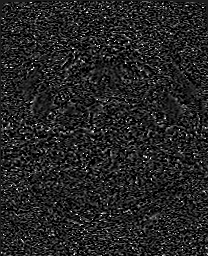
[im 20/60]
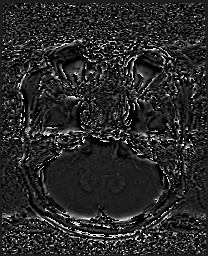
[im 40/60]
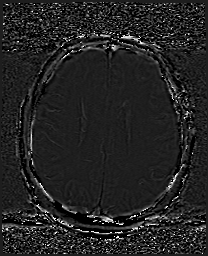
[im 60/60]
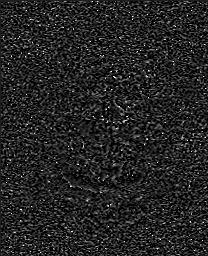

[Series 14: swi_images · axial · 3.0mm · 0.90mm/px · z∈[-85,+92]mm · 4 of 60 slices shown]
[im 1/60]
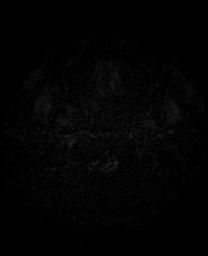
[im 20/60]
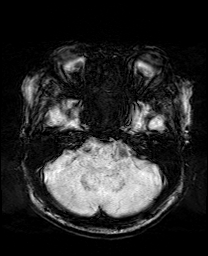
[im 40/60]
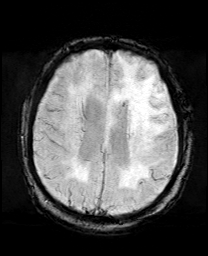
[im 60/60]
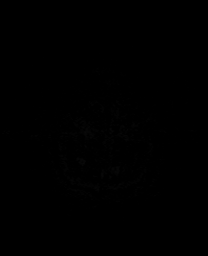

[Series 17: FLAIR · axial · 5.0mm · 0.90mm/px · z∈[-65,+79]mm · 2 of 25 slices shown (2 of 2)]
[im 1/25]
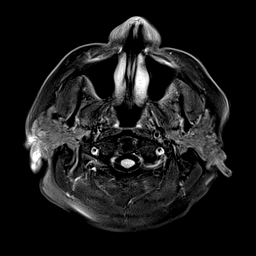
[im 25/25]
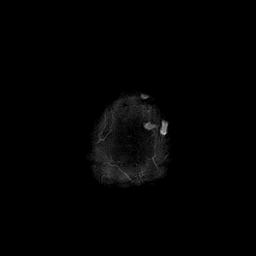

[Series 18: T2 post-contrast · coronal · 5.0mm · 0.72mm/px · 2 of 29 slices shown]
[im 1/29]
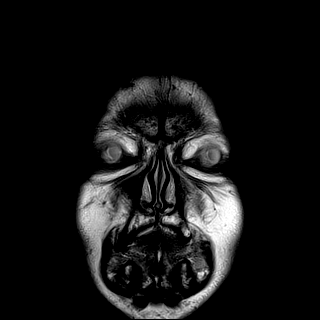
[im 29/29]
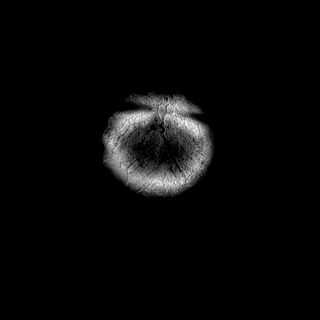

[Series 20: T1 post-contrast · coronal · 5.0mm · 0.34mm/px · 2 of 29 slices shown (1 of 2)]
[im 1/29]
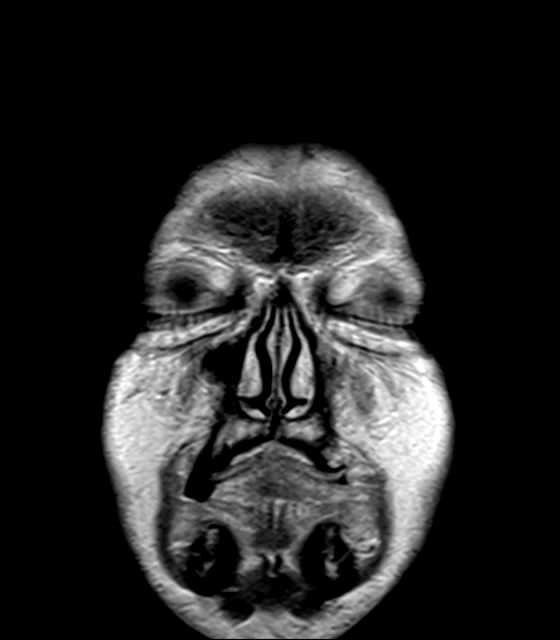
[im 29/29]
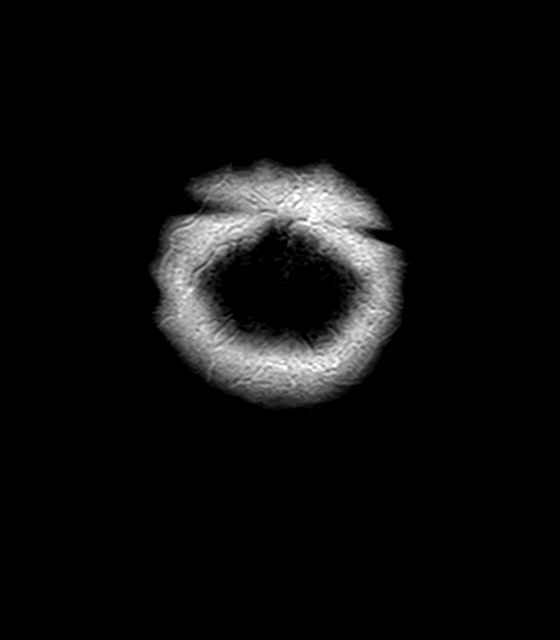

[Series 21: T1 post-contrast · sagittal · 5.0mm · 0.75mm/px · 2 of 25 slices shown (2 of 2)]
[im 1/25]
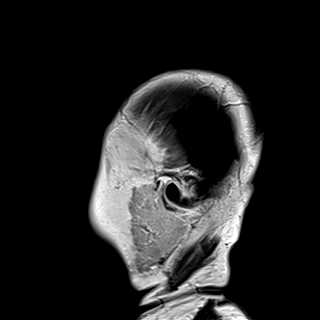
[im 25/25]
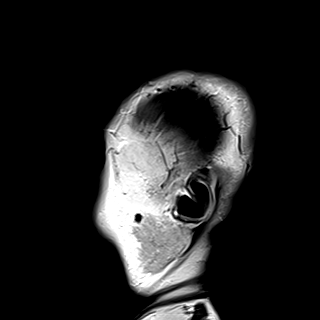

[40 of 48 positions shown; findings below may reference images not displayed]

FINDINGS: Brain: There is no acute infarct, midline shift, or extra-axial
fluid collection. There is moderate cerebral atrophy.

Sequelae of left frontal craniotomy and tumor resection are again
identified with chronic blood products at the left frontal resection
site. Extensive nonenhancing T2 hyperintensity involving the left
greater than right cerebral white matter is unchanged with mild
left-sided sulcal effacement and mild mass effect on the left
lateral ventricle again noted. Irregular enhancement at the left
frontal resection site extending to the superior margin of the left
lateral ventricle has not significantly changed in extent, with a
maximal diameter of 3.4 cm on axial images. A small focus of
restricted diffusion along the posterior aspect of the enhancement
is stable to slightly decreased. No abnormal enhancement is
identified remote from the resection site.

Vascular: Major intracranial vascular flow voids are preserved.

Skull and upper cervical spine: Right frontal craniotomy. No
suspicious marrow lesion. Advanced upper cervical disc and facet
degeneration.

Sinuses/Orbits: Unremarkable orbits. Paranasal sinuses and mastoid
air cells are clear.

Other: Unchanged 7 mm cystic focus in the left parotid gland.
IMPRESSION: 1. No acute infarct or other acute intracranial abnormality.
2. Unchanged irregularly enhancing lesion in the left frontal lobe
with unchanged extensive white matter T2 signal abnormality which
could reflect a combination of edema and nonenhancing tumor.

## 2020-01-27 IMAGING — CT CT HEAD CODE STROKE W/O CM
3 series · 15 of 47 positions shown, 18 images · non-contrast
Comparison: 11/17/2018 MRI of the head.  11/13/2017 CT of the head.

CLINICAL DATA: Code stroke. 78 y/o M; altered mental status and
right-sided facial droop as well as right arm contracture. Possible
stroke. History of glioblastoma post resection.

EXAM:
CT HEAD WITHOUT CONTRAST
TECHNIQUE: Contiguous axial images were obtained from the base of the skull
through the vertex without intravenous contrast.

[Series 3: head 5.0 st · axial · 0.42mm/px · z∈[-66,+64]mm · 9 of 32 slices shown, 12 images]
[im 3/32  brain]
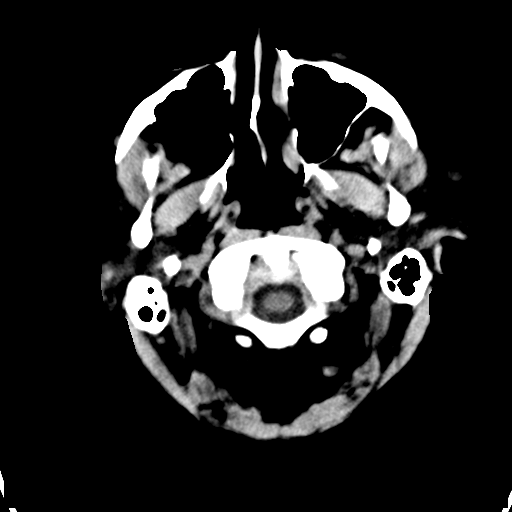
[im 3/32  bone]
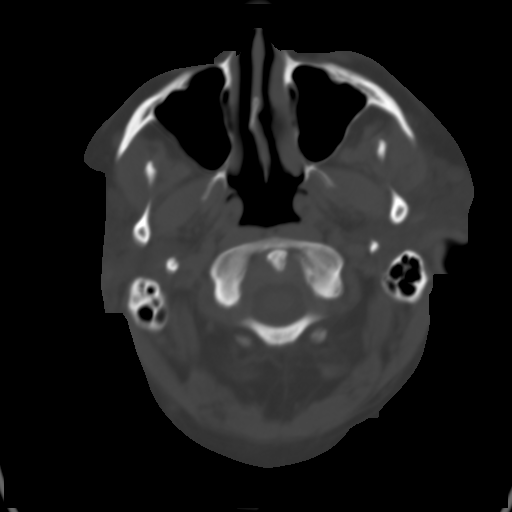
[im 6/32  brain]
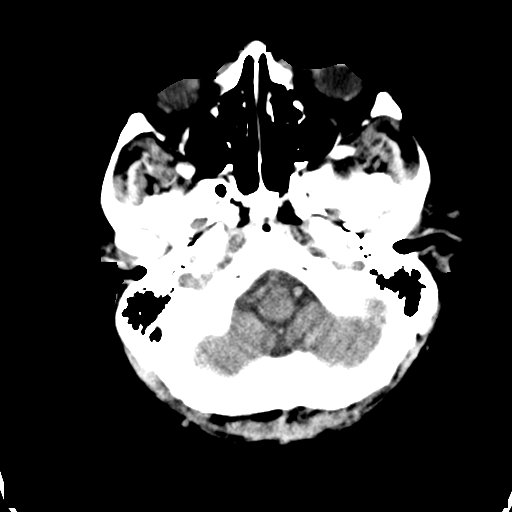
[im 9/32  brain]
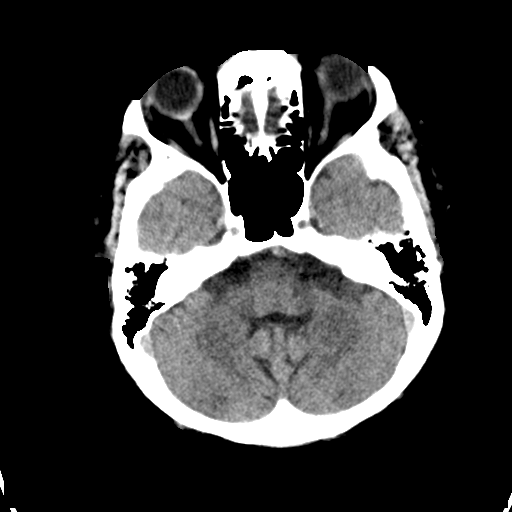
[im 12/32  brain]
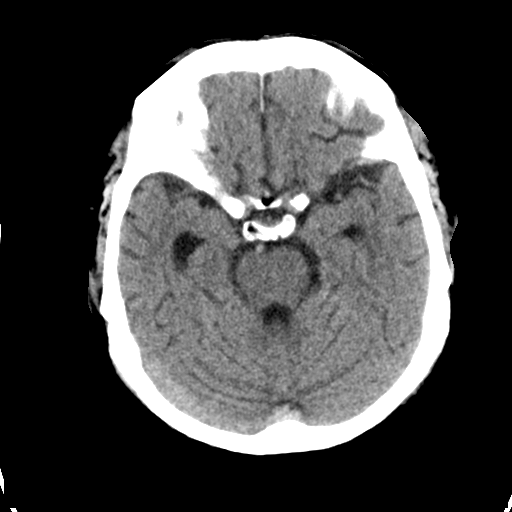
[im 17/32  brain]
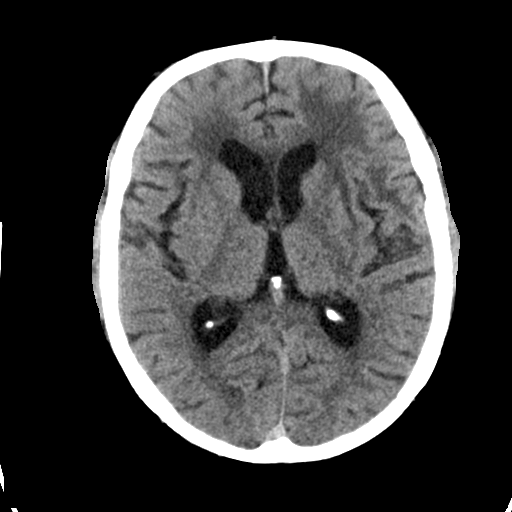
[im 17/32  bone]
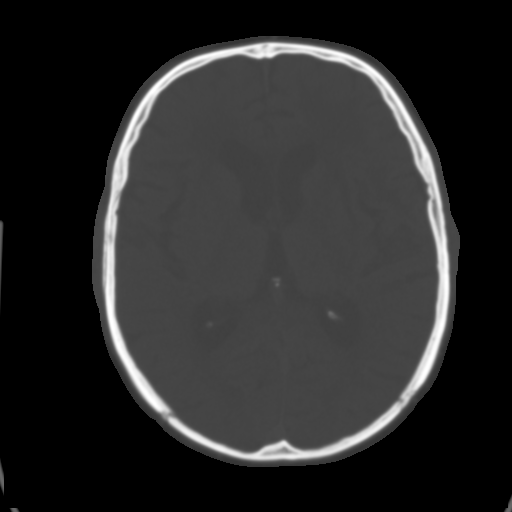
[im 20/32  brain]
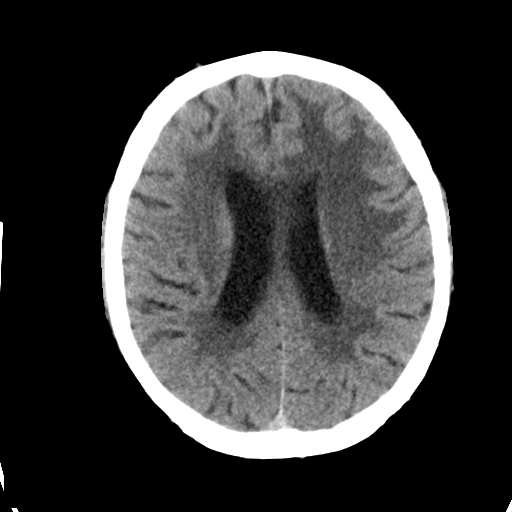
[im 23/32  brain]
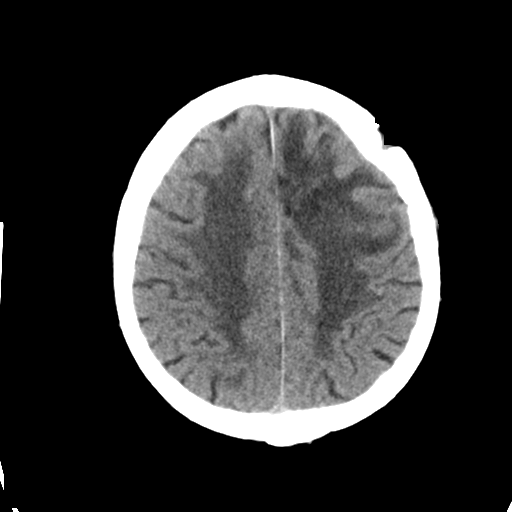
[im 26/32  brain]
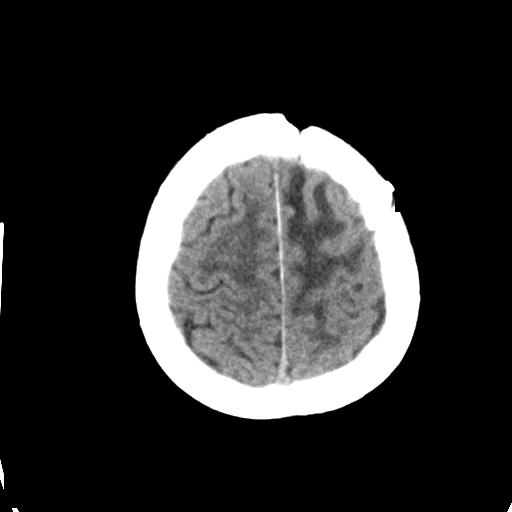
[im 29/32  brain]
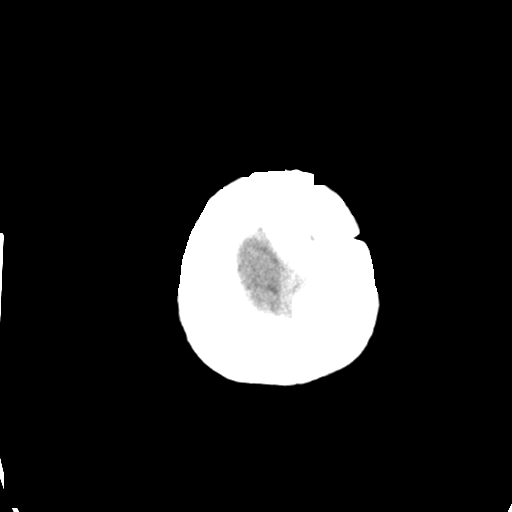
[im 29/32  bone]
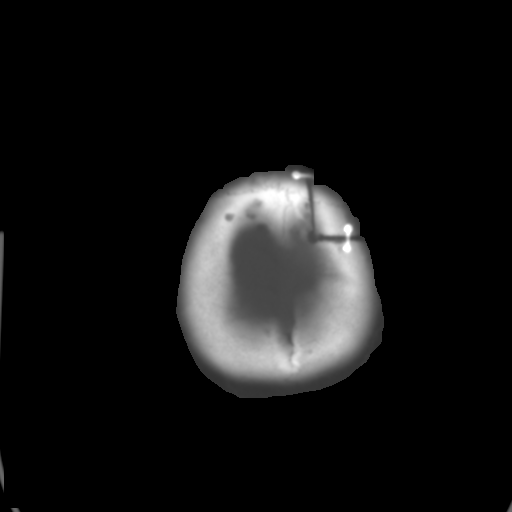

[Series 5: head 3.0 cor st · coronal · 0.32mm/px · 3 of 67 slices shown]
[im 23/67  brain]
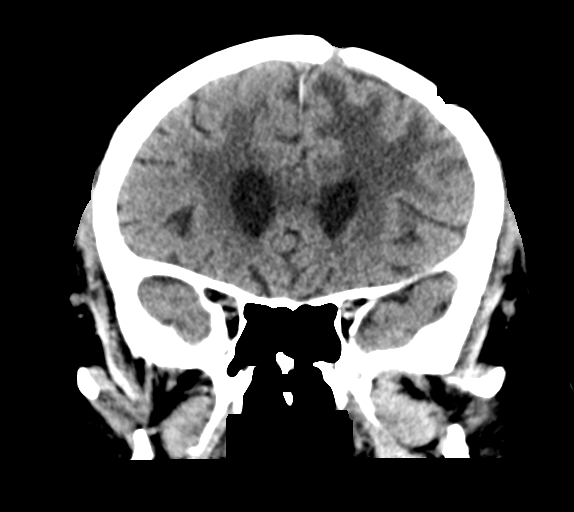
[im 30/67  brain]
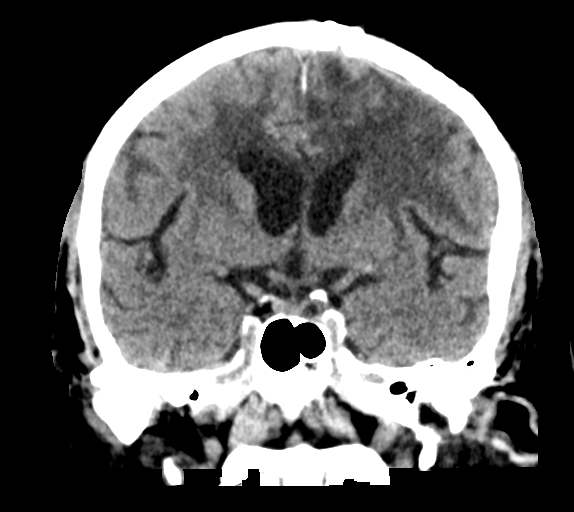
[im 37/67  brain]
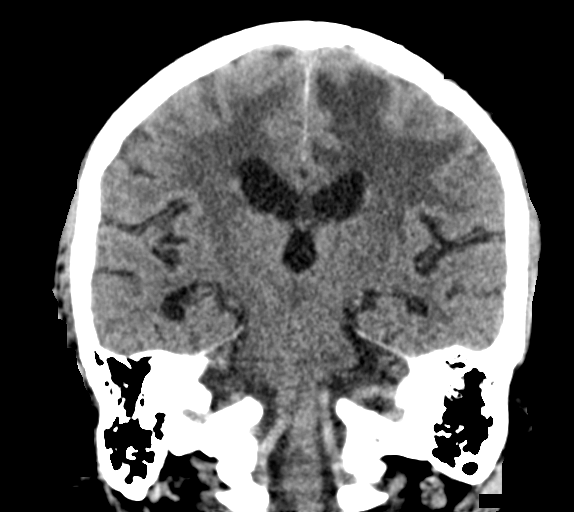

[Series 6: head 3.0 sag st · sagittal · 0.34mm/px · 3 of 67 slices shown]
[im 23/67  brain]
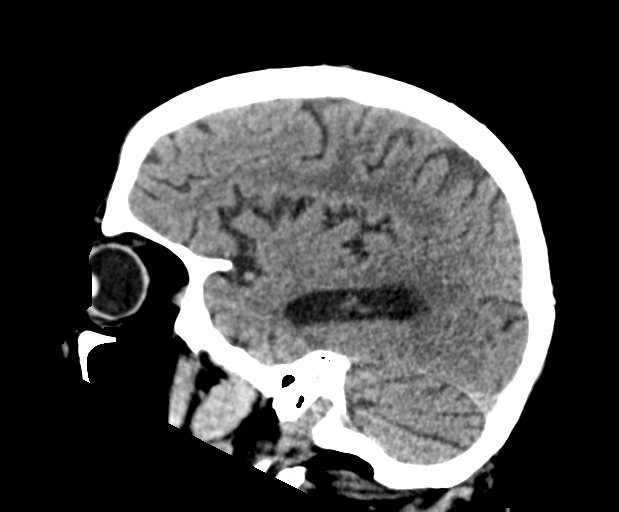
[im 34/67  brain]
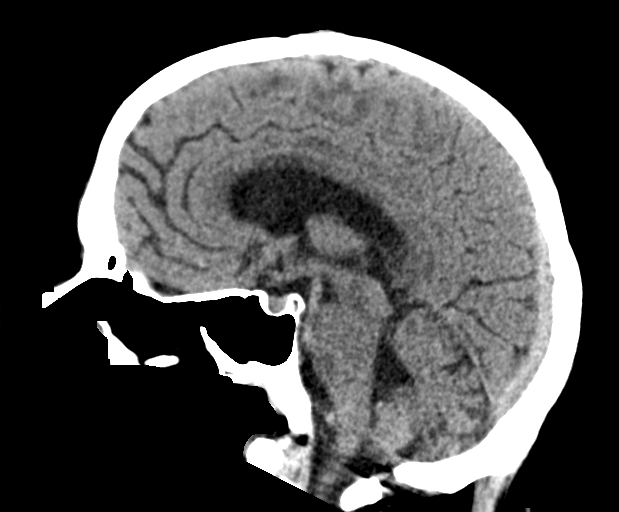
[im 45/67  brain]
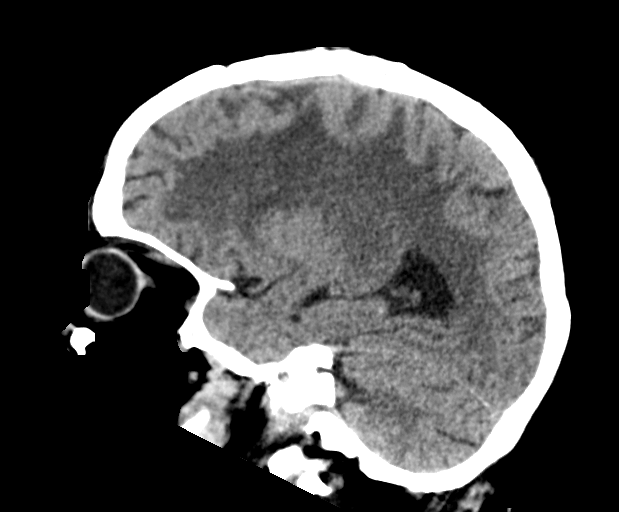

[15 of 47 positions shown; findings below may reference images not displayed]

FINDINGS: Brain: No acute stroke, hemorrhage, hydrocephalus, extra-axial
collection, or herniation. Diffuse hypoattenuation white matter in
the left-greater-than-right cerebrum is stable in distribution in
comparison with T2 signal abnormality on the prior MRI of the brain
given differences in technique. There is stable mass effect in the
left anterior frontal lobe corresponding to the region of resected
glioblastoma. Suboptimal assessment of the tumor in the absence of
contrast.

Vascular: No hyperdense vessel or unexpected calcification.

Skull: Stable postsurgical changes related to left frontal
craniotomy.

Sinuses/Orbits: No acute finding.

Other: None.

ASPECTS (Alberta Stroke Program Early CT Score)

- Ganglionic level infarction (caudate, lentiform nuclei, internal
capsule, insula, M1-M3 cortex): 7

- Supraganglionic infarction (M4-M6 cortex): 3

Total score (0-10 with 10 being normal): 10
IMPRESSION: 1. No acute stroke or hemorrhage identified.
2. ASPECTS is 10
3. extensive white matter changes, mass effect, and postsurgical
changes of the left frontal lobe related to history of glioblastoma
and resection are grossly stable from prior MRI given differences in
technique.

These results were communicated to Dr. Dusun at [DATE] [place]
12/29/2018by text page via the [employer] messaging system.

## 2020-03-02 IMAGING — CT CT HEAD CODE STROKE W/O CM
3 series · 14 of 47 positions shown, 16 images · non-contrast
Comparison: MRI 12/29/2018.  CT 12/29/2018

CLINICAL DATA: Code stroke. Symptoms of right arm weakness and
slurred speech. Glioblastoma.

EXAM:
CT HEAD WITHOUT CONTRAST
TECHNIQUE: Contiguous axial images were obtained from the base of the skull
through the vertex without intravenous contrast.

[Series 3: head 5.0 st · axial · 0.43mm/px · z∈[-111,+14]mm · 8 of 30 slices shown, 10 images]
[im 3/30  brain]
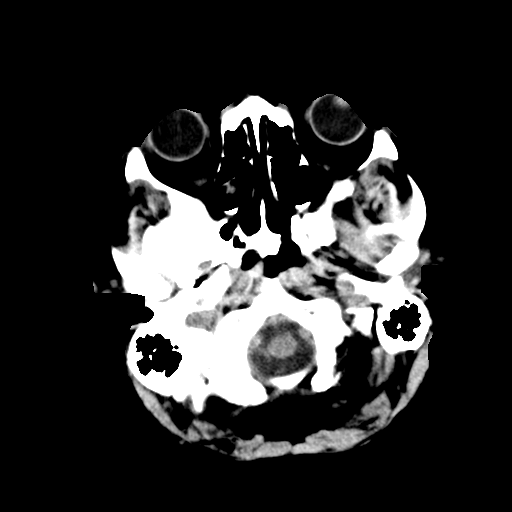
[im 3/30  bone]
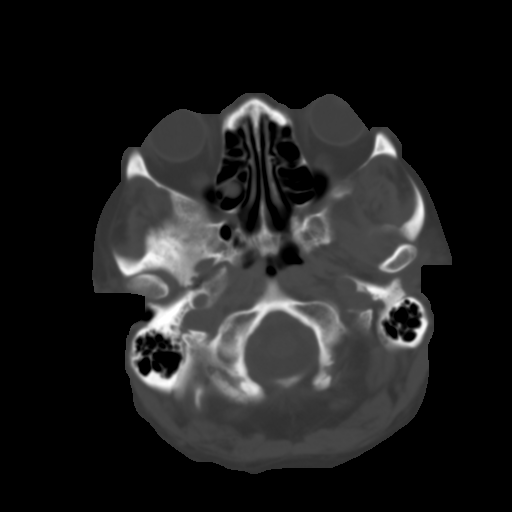
[im 7/30  brain]
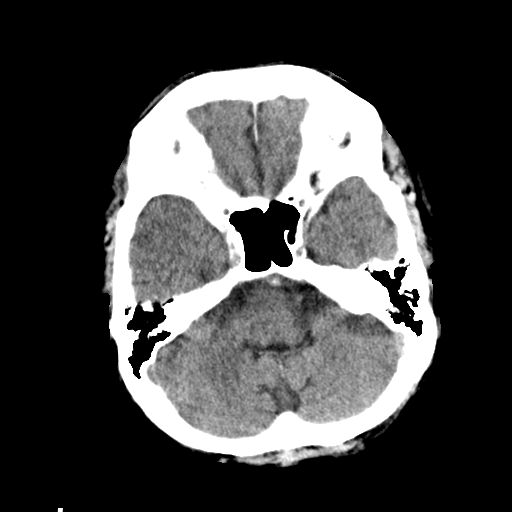
[im 10/30  brain]
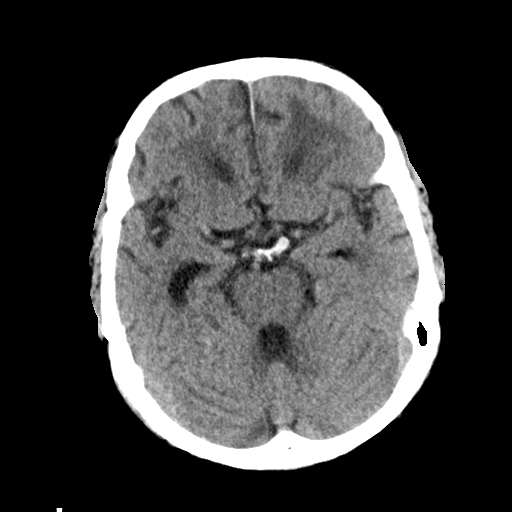
[im 14/30  brain]
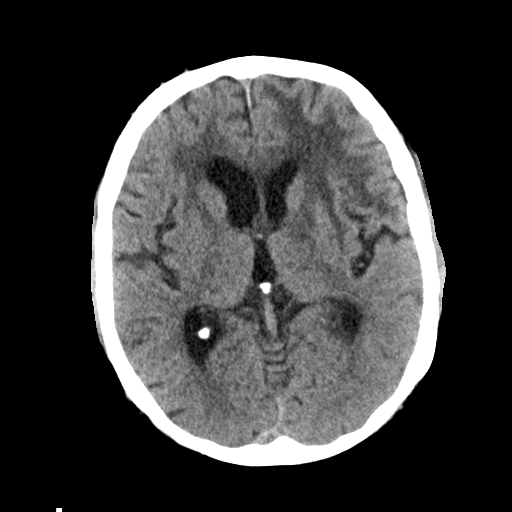
[im 17/30  brain]
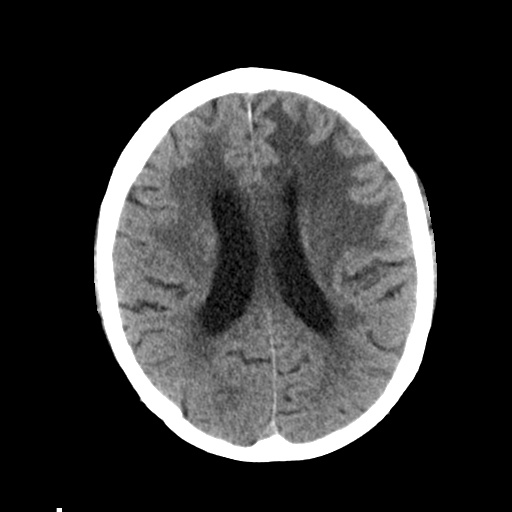
[im 17/30  bone]
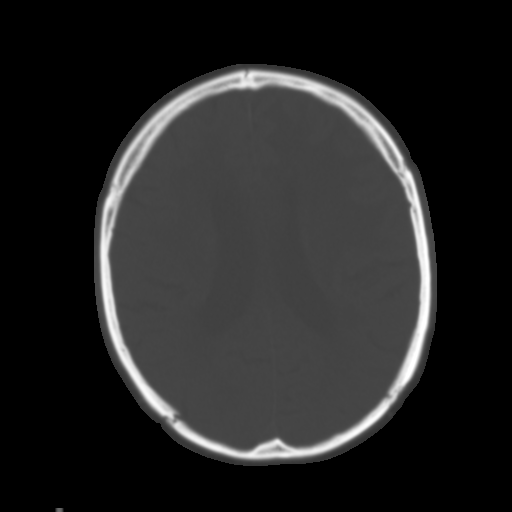
[im 21/30  brain]
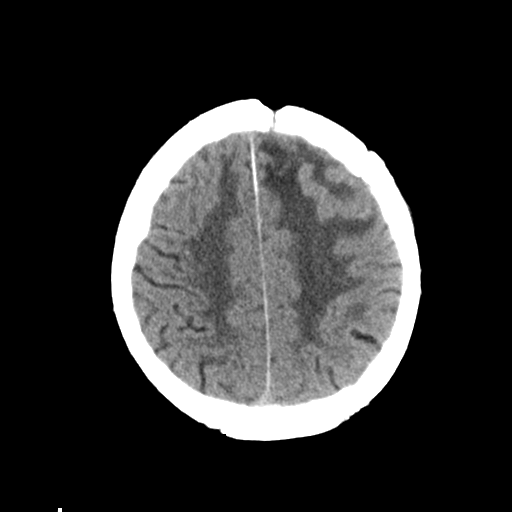
[im 24/30  brain]
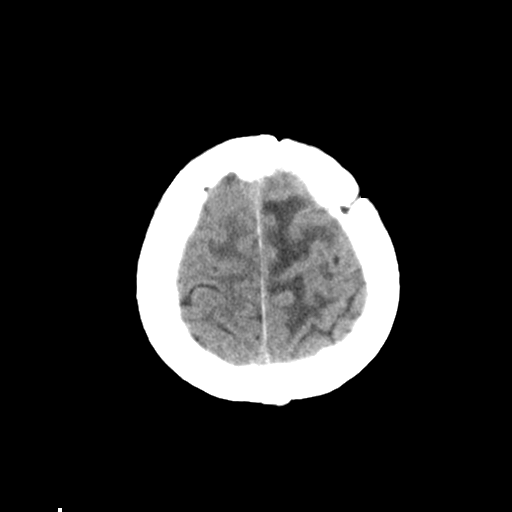
[im 28/30  brain]
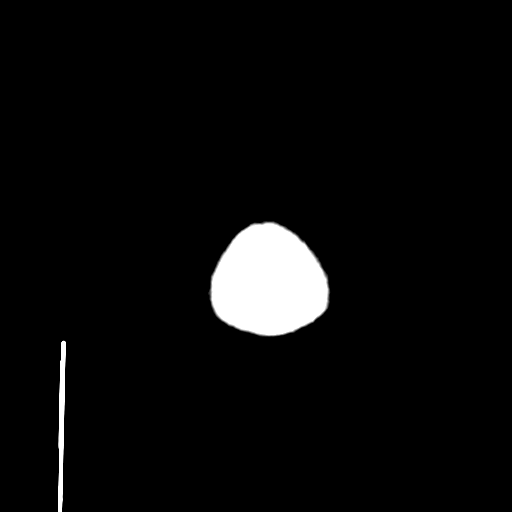

[Series 5: head 3.0 cor st · coronal · 0.29mm/px · 3 of 67 slices shown]
[im 23/67  brain]
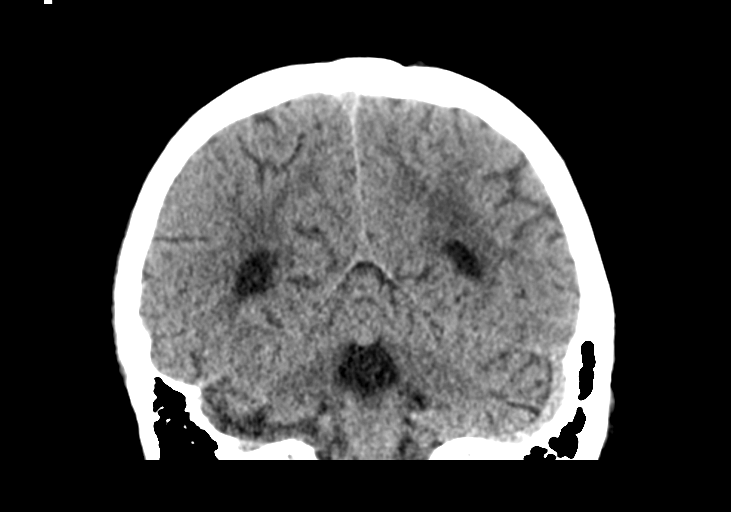
[im 30/67  brain]
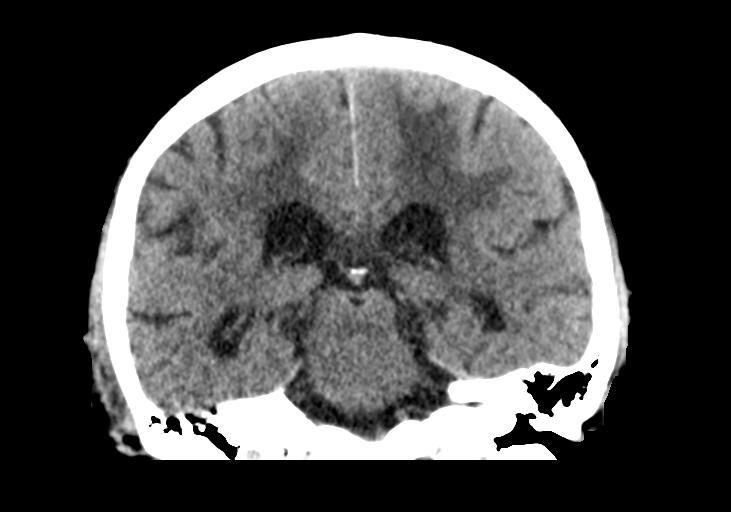
[im 37/67  brain]
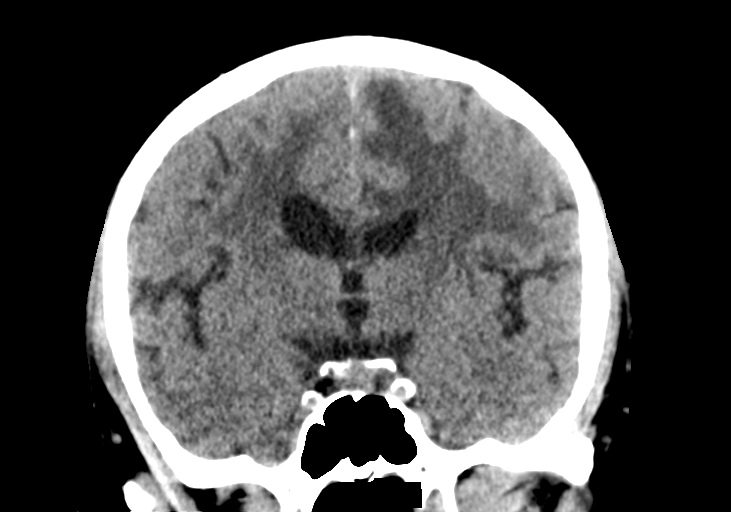

[Series 6: head 3.0 sag st · sagittal · 0.29mm/px · 3 of 67 slices shown]
[im 23/67  brain]
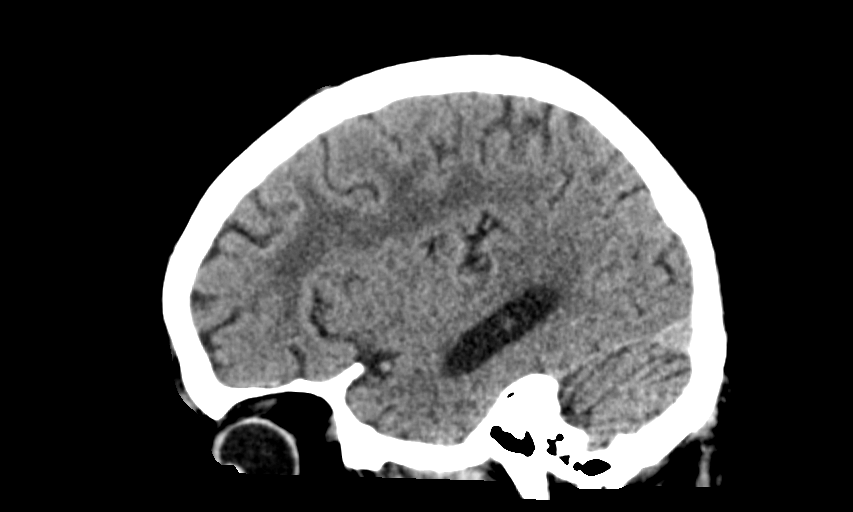
[im 34/67  brain]
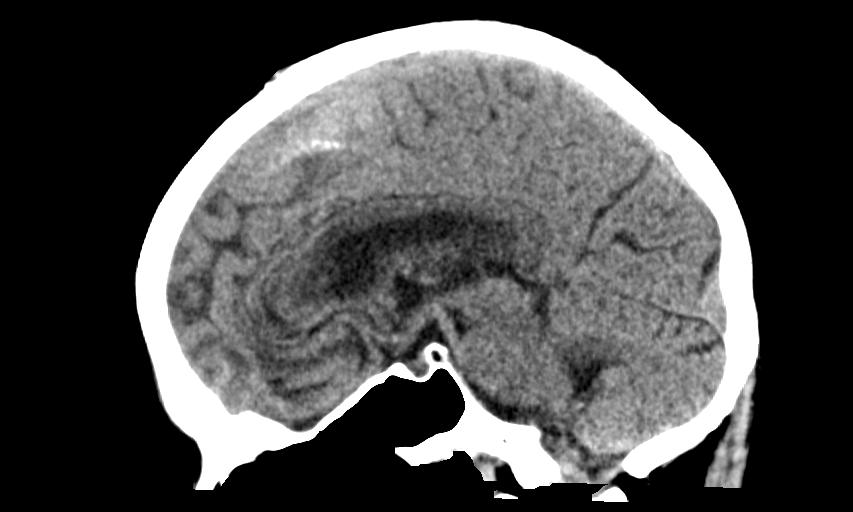
[im 45/67  brain]
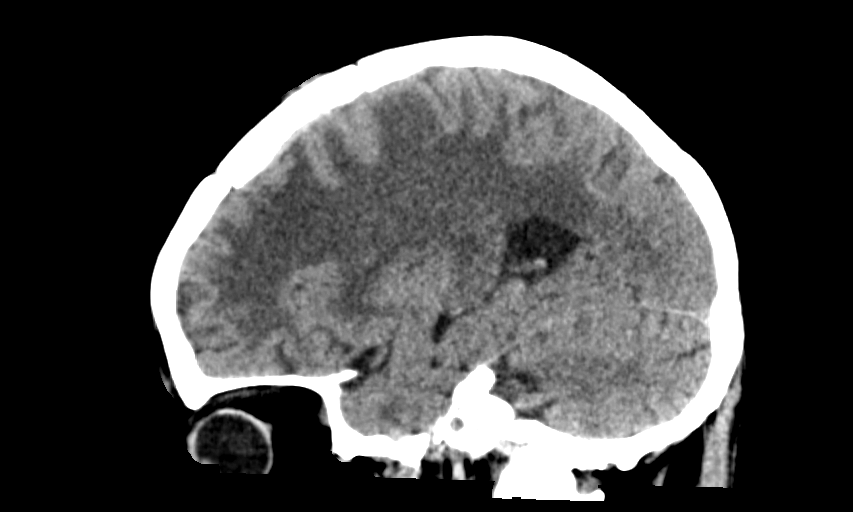

[14 of 47 positions shown; findings below may reference images not displayed]

FINDINGS: Brain: Previous left frontal craniotomy. 3 cm left frontal mass
consistent with glioblastoma difficult to appreciate by noncontrast
CT. Extensive vasogenic edema throughout the left hemisphere as seen
previously. White matter low density consistent with previous
radiation change as seen previously. No sign of acute infarction,
hemorrhage, hydrocephalus or extra-axial collection. No midline
shift.

Vascular: No abnormal vascular finding.

Skull: Left frontal craniotomy changes.

Sinuses/Orbits: Negative

Other: None

ASPECTS (Alberta Stroke Program Early CT Score)

- Ganglionic level infarction (caudate, lentiform nuclei, internal
capsule, insula, M1-M3 cortex): 7

- Supraganglionic infarction (M4-M6 cortex): 3

Total score (0-10 with 10 being normal): 10
IMPRESSION: 1. No sign of ischemic disease in this patient with a known left
frontal glioblastoma, extensive left frontal vasogenic edema and
widespread white matter changes subsequent radiation. No change
since the previous studies of last month.
2. ASPECTS is 10.
3. These results were communicated to Dr. Buuxo at [DATE] pmon
02/02/2019by text page via the [employer] messaging system.

## 2020-03-02 IMAGING — CT CT ANGIOGRAPHY HEAD
1 of 9 series · 6 of 33 positions shown · IV contrast (OMNI 350)
Comparison: Head CT same day.  MRI and CT studies 12/29/2018

CLINICAL DATA: Acute presentation with right arm weakness and
slurred speech

EXAM:
CT ANGIOGRAPHY HEAD AND NECK
TECHNIQUE: Multidetector CT imaging of the head and neck was performed using
the standard protocol during bolus administration of intravenous
contrast. Multiplanar CT image reconstructions and MIPs were
obtained to evaluate the vascular anatomy. Carotid stenosis
measurements (when applicable) are obtained utilizing NASCET
criteria, using the distal internal carotid diameter as the
denominator.
CONTRAST:  75mL OMNIPAQUE IOHEXOL 350 MG/ML SOLN

[Series 6: cta neck thins · axial · 0.45mm/px · z∈[-264,-36]mm · 6 of 801 slices shown]
[im 115/801  soft-tissue]
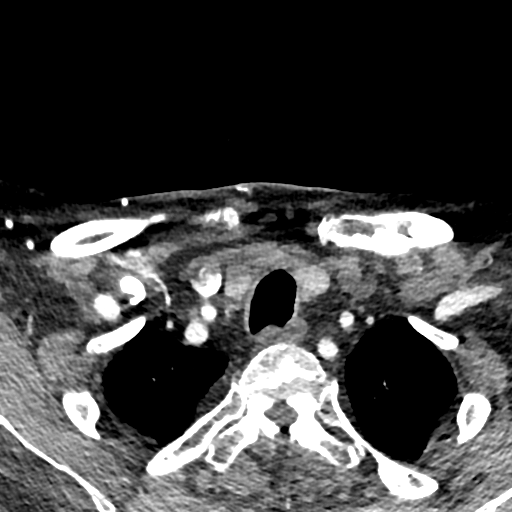
[im 229/801  bone]
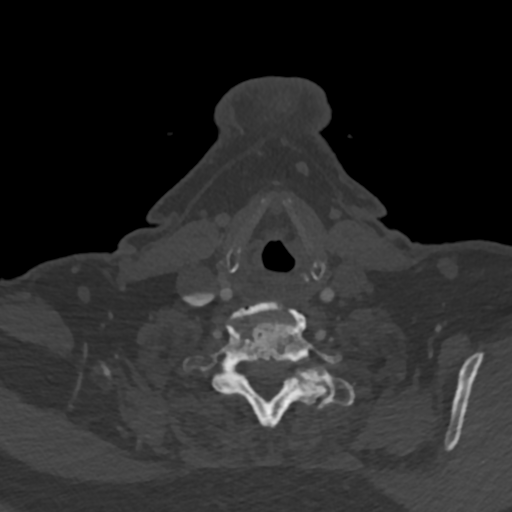
[im 343/801  soft-tissue]
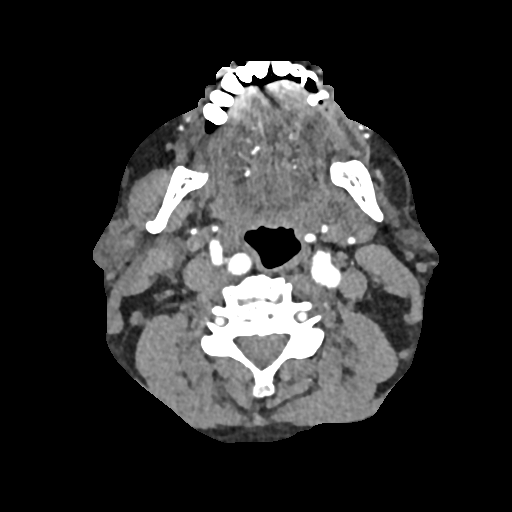
[im 458/801  bone]
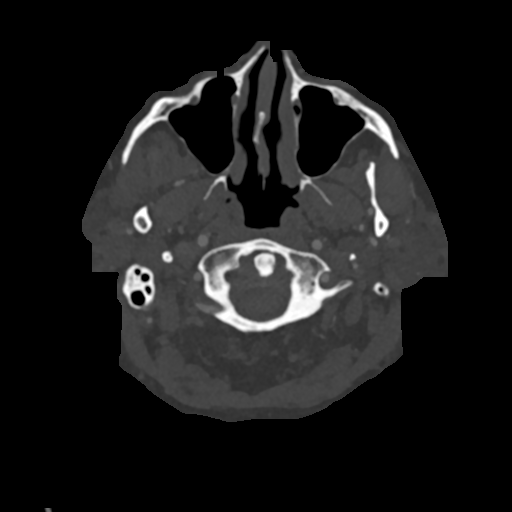
[im 572/801  soft-tissue]
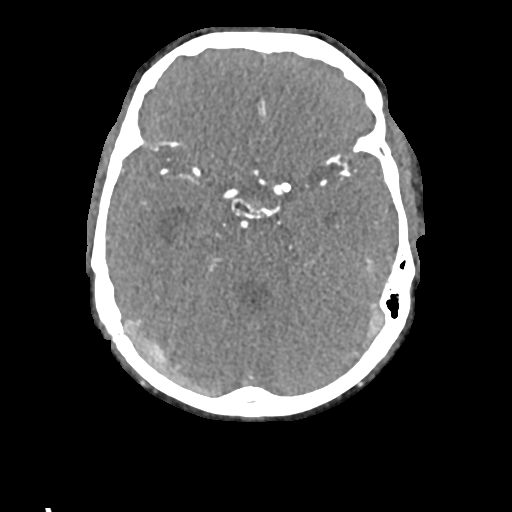
[im 686/801  bone]
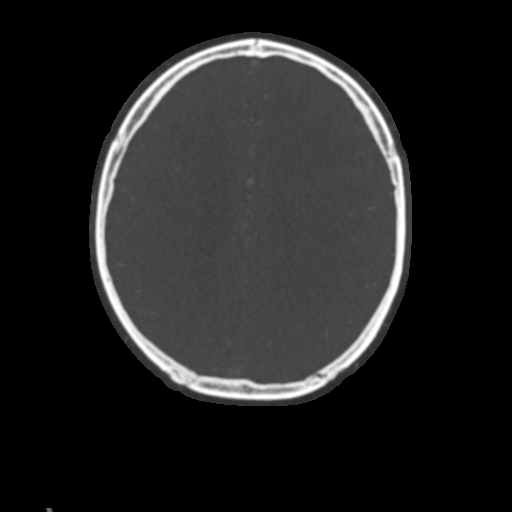

[6 of 33 positions shown; findings below may reference images not displayed]

FINDINGS: CTA NECK FINDINGS

Aortic arch: Aortic atherosclerosis. No aneurysm or dissection.
Branching pattern is normal without origin stenosis.

Right carotid system: Common carotid artery widely patent to the
bifurcation. Carotid bifurcation shows minimal atherosclerotic
disease but no stenosis or irregularity. Cervical ICA is tortuous
but widely patent.

Left carotid system: Left common carotid artery is widely patent to
the bifurcation. Carotid bifurcation is widely patent. Cervical ICA
is normal.

Vertebral arteries: Both vertebral artery origins are widely patent.
Both vertebral arteries are normal through the cervical region to
the foramen magnum.

Skeleton: Advanced chronic cervical spondylosis as seen previously.

Other neck: No soft tissue mass or lymphadenopathy.

Upper chest: Negative

Review of the MIP images confirms the above findings

CTA HEAD FINDINGS

Anterior circulation: Both internal carotid arteries are patent
through the skull base and siphon regions. Siphon atherosclerotic
calcification but no stenosis. The anterior and middle cerebral
vessels are patent without proximal stenosis, aneurysm or vascular
malformation. No large or medium vessel occlusion.

Posterior circulation: Both vertebral arteries are widely patent to
the basilar. No basilar stenosis. Posterior circulation branch
vessels are patent. Mild irregularity of both PCA vessels as shown
previously.

Venous sinuses: Patent and normal.

Anatomic variants: None significant.

Delayed phase: Not performed.

Review of the MIP images confirms the above findings
IMPRESSION: No acute vascular finding. No change since 1 month ago. Normal
except for minimal nonstenotic atherosclerotic change at the
bifurcation regions and siphons and some atherosclerotic narrowing
and irregularity of the PCA branches.

These results were communicated to Dr. Don Lolito at [DATE] pmon
02/02/2019by text page via the [employer] messaging system.

## 2020-03-03 IMAGING — MR MRI HEAD WITHOUT CONTRAST
10 series · 48 of 48 positions shown · non-contrast
Comparison: None.

EXAM:
MRI HEAD WITHOUT CONTRAST
TECHNIQUE: Multiplanar, multiecho pulse sequences of the brain and surrounding
structures were obtained without intravenous contrast.

[Series 5: DWI · axial · 3.0mm · 0.88mm/px · z∈[-58,+79]mm · 12 of 94 slices shown (1 of 4)]
[im 1/94]
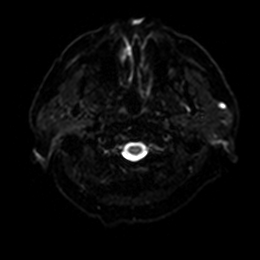
[im 9/94]
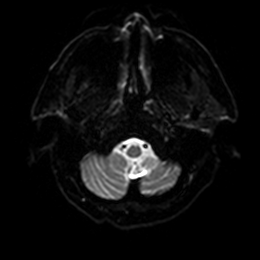
[im 17/94]
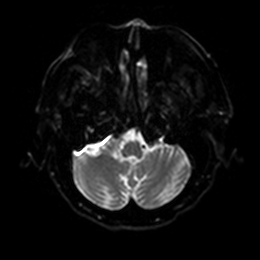
[im 26/94]
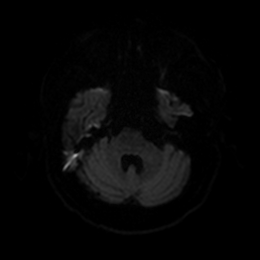
[im 34/94]
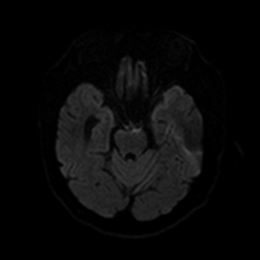
[im 43/94]
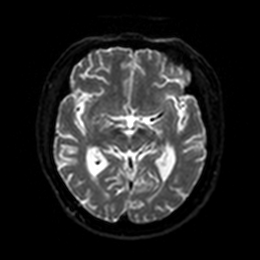
[im 51/94]
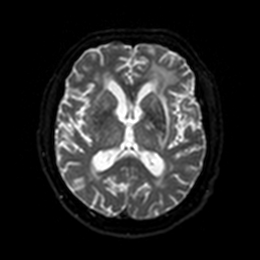
[im 60/94]
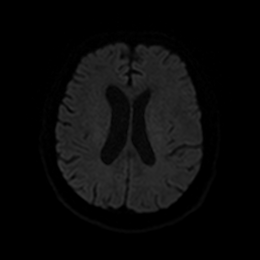
[im 68/94]
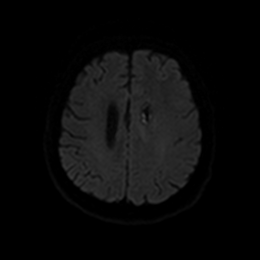
[im 77/94]
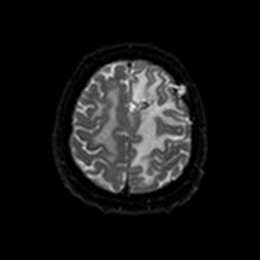
[im 85/94]
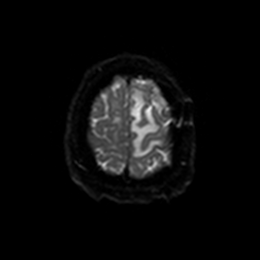
[im 94/94]
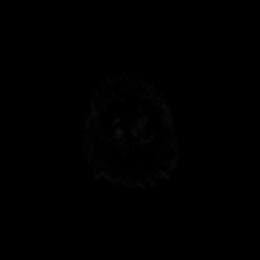

[Series 6: DWI · axial · 3.0mm · 0.88mm/px · z∈[-58,+79]mm · 6 of 47 slices shown (2 of 4)]
[im 1/47]
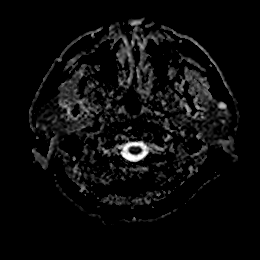
[im 10/47]
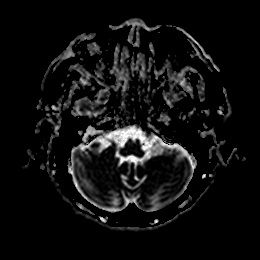
[im 19/47]
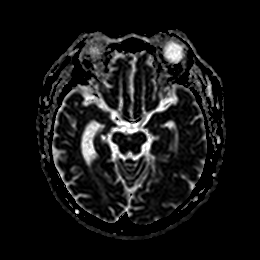
[im 28/47]
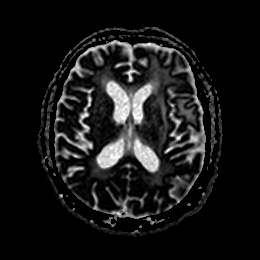
[im 37/47]
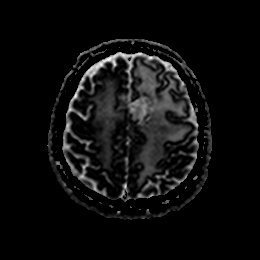
[im 47/47]
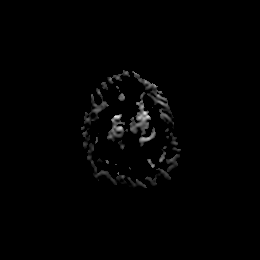

[Series 7: DWI · coronal · 4.0mm · 0.88mm/px · 8 of 68 slices shown (3 of 4)]
[im 1/68]
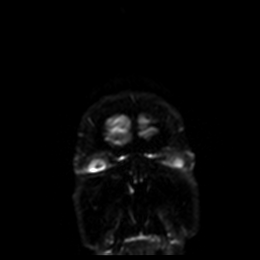
[im 10/68]
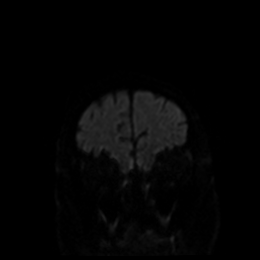
[im 20/68]
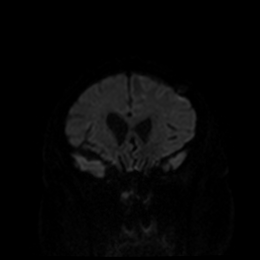
[im 29/68]
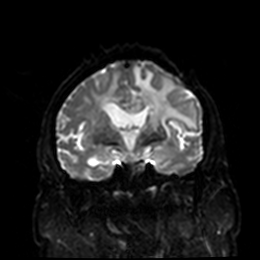
[im 39/68]
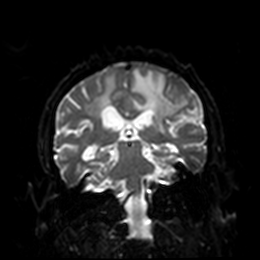
[im 48/68]
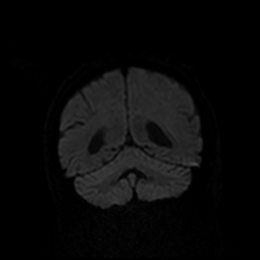
[im 58/68]
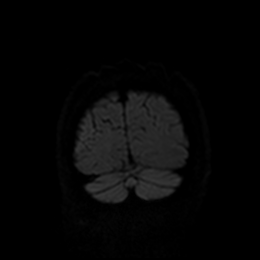
[im 68/68]
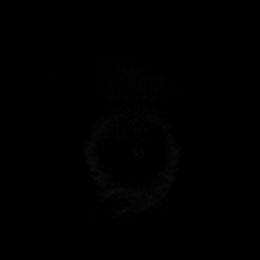

[Series 8: DWI · coronal · 4.0mm · 0.88mm/px · 4 of 34 slices shown (4 of 4)]
[im 1/34]
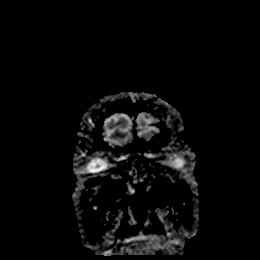
[im 12/34]
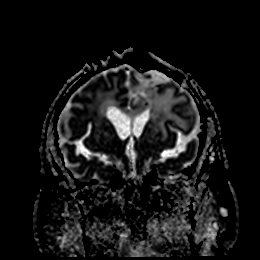
[im 23/34]
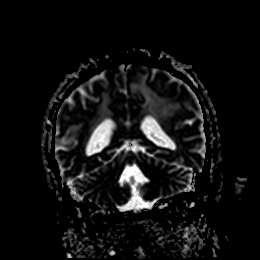
[im 34/34]
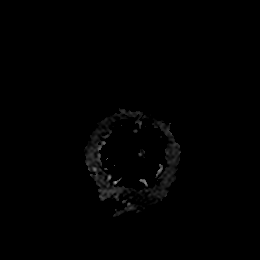

[Series 9: T1 · sagittal · 5.0mm · 0.75mm/px · 3 of 25 slices shown]
[im 1/25]
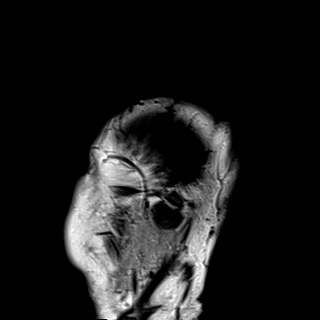
[im 13/25]
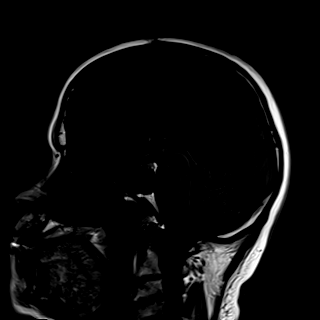
[im 25/25]
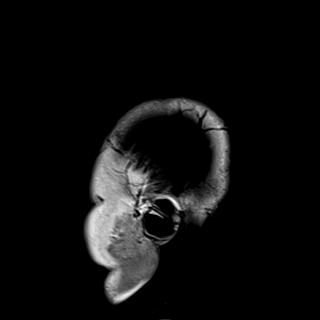

[Series 10: T2 · axial · 5.0mm · 0.72mm/px · z∈[-62,+81]mm · 3 of 25 slices shown (1 of 2)]
[im 1/25]
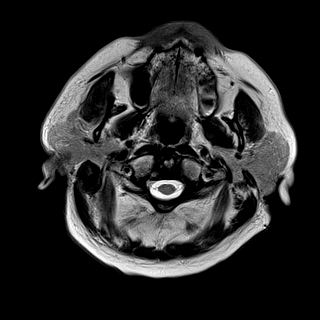
[im 13/25]
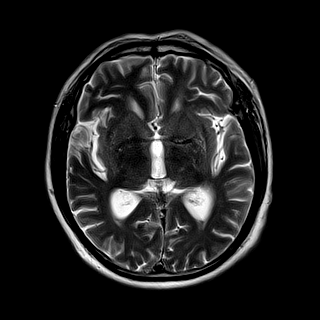
[im 25/25]
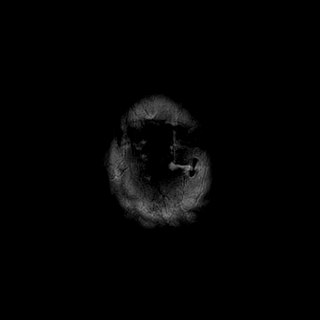

[Series 11: FLAIR · axial · 5.0mm · 0.90mm/px · z∈[-63,+80]mm · 3 of 25 slices shown (1 of 2)]
[im 1/25]
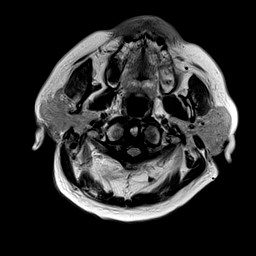
[im 13/25]
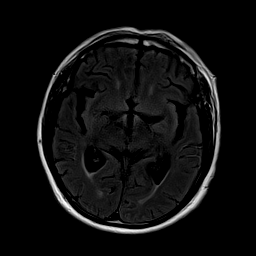
[im 25/25]
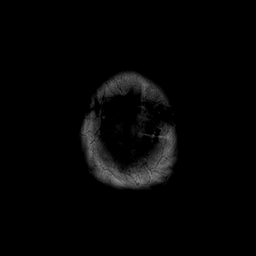

[Series 12: ax hemo · axial · 5.0mm · 0.86mm/px · z∈[-63,+80]mm · 3 of 25 slices shown]
[im 1/25]
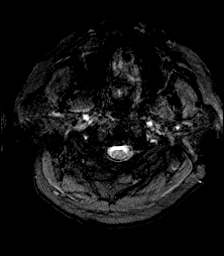
[im 13/25]
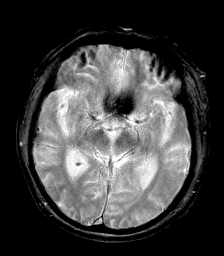
[im 25/25]
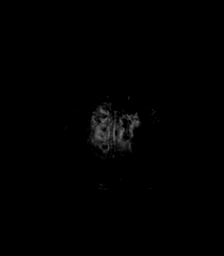

[Series 13: FLAIR · axial · 5.0mm · 0.90mm/px · z∈[-63,+80]mm · 3 of 25 slices shown (2 of 2)]
[im 1/25]
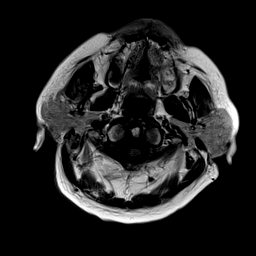
[im 13/25]
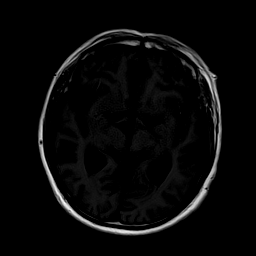
[im 25/25]
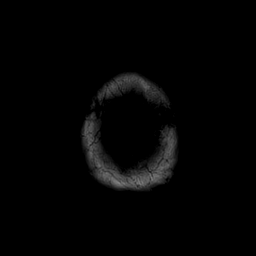

[Series 14: T2 · coronal · 5.0mm · 0.72mm/px · 3 of 28 slices shown (2 of 2)]
[im 1/28]
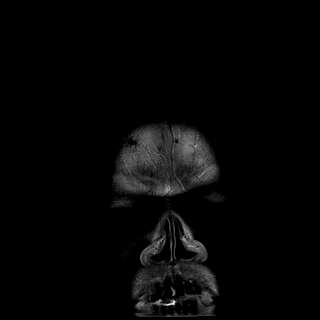
[im 14/28]
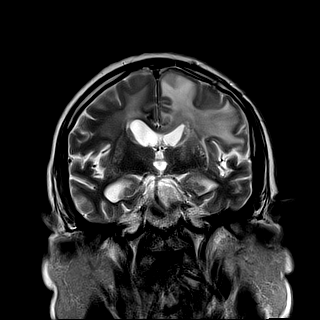
[im 28/28]
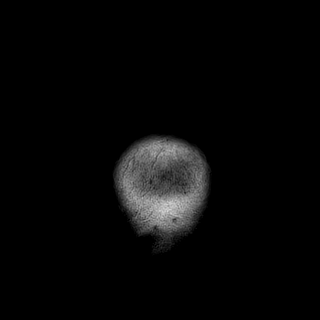

[48 of 48 positions shown; findings below may reference images not displayed]

FINDINGS: Brain: There is no acute ischemia. There is a large area of
hyperintense T2-weighted signal centered within the left frontal
lobe, unchanged from the prior study. This is superimposed on
diffuse periventricular white matter hyperintensity. No contrast
agent was administered on the current examination, limiting
comparison to the prior study. There are areas of chronic hemorrhage
at the left frontal lobe resection site. There is no midline shift
or other mass effect. No acute intracranial hemorrhage.

Vascular: The major intracranial arterial and venous sinus flow
voids are preserved.

Skull and upper cervical spine: Remote left pterional craniotomy.

Sinuses/Orbits: No fluid levels or advanced mucosal thickening of
the visualized paranasal sinuses. No mastoid or middle ear effusion.
The orbits are normal.

Other: None
IMPRESSION: 1. Unchanged appearance of large amount of hyperintense T2-weighted
signal centered in the left frontal lobe, which may indicate a
combination of edema and/or nonenhancing tumor.
2. Comparison otherwise limited by lack of intravenous contrast
material on the current study.
3. No acute ischemia.

## 2020-04-17 IMAGING — MR MR HEAD WO/W CM
9 of 14 series · 30 of 48 positions shown · IV contrast (gadavist)
Comparison: Postcontrast MRI 12/29/2018

CLINICAL DATA: Follow-up glioblastoma

EXAM:
MRI HEAD WITHOUT AND WITH CONTRAST
TECHNIQUE: Multiplanar, multiecho pulse sequences of the brain and surrounding
structures were obtained without and with intravenous contrast.
CONTRAST:  10 cc Gadavist intravenous

[Series 3: DWI · axial · 3.0mm · 1.09mm/px · z∈[-33,+123]mm · 5 of 106 slices shown (1 of 4)]
[im 1/106]
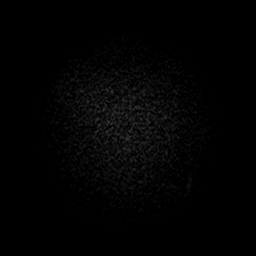
[im 27/106]
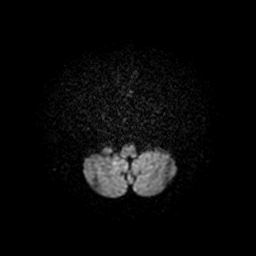
[im 53/106]
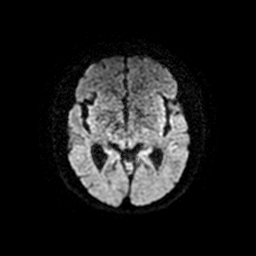
[im 79/106]
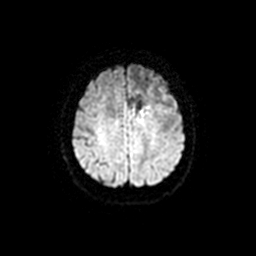
[im 106/106]
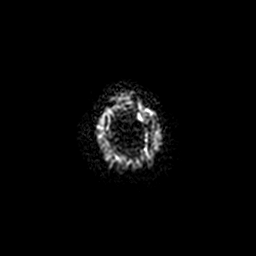

[Series 5: T2 · axial · 5.0mm · 0.43mm/px · 1 of 27 slices shown]
[im 1/27]
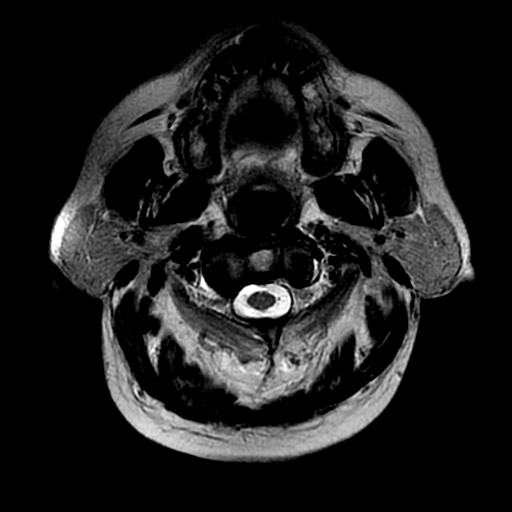

[Series 6: FLAIR · axial · 3.0mm · 0.43mm/px · z∈[-42,+114]mm · 2 of 27 slices shown]
[im 1/27]
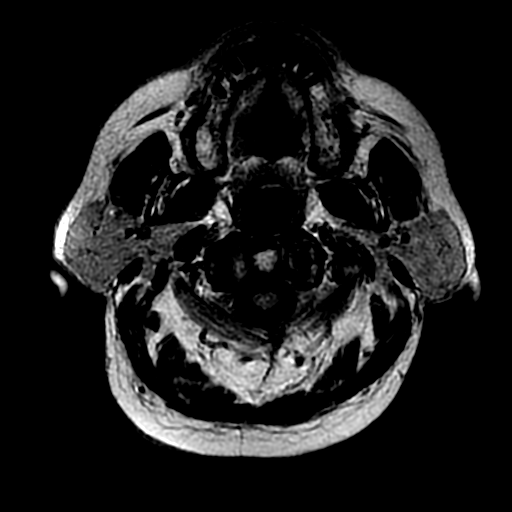
[im 27/27]
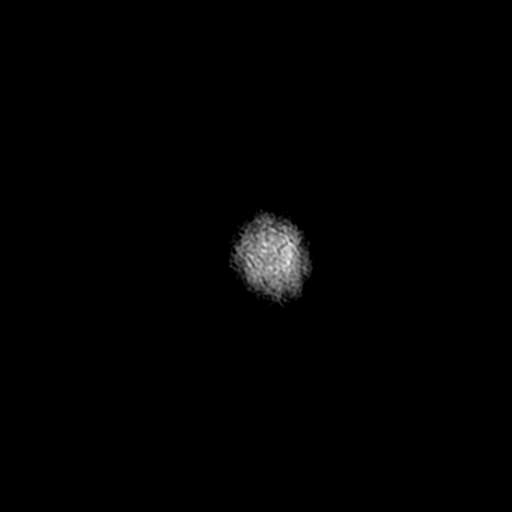

[Series 9: DWI · coronal · 4.0mm · 1.09mm/px · 5 of 86 slices shown (2 of 4)]
[im 1/86]
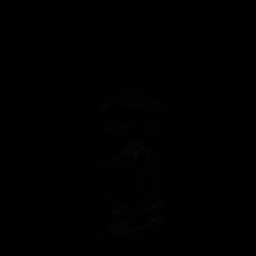
[im 22/86]
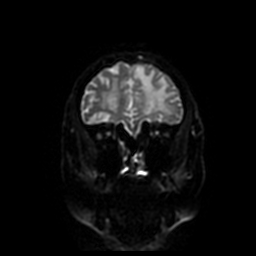
[im 43/86]
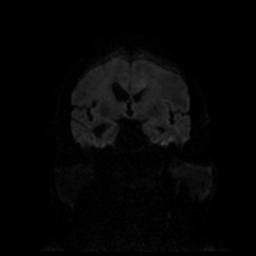
[im 64/86]
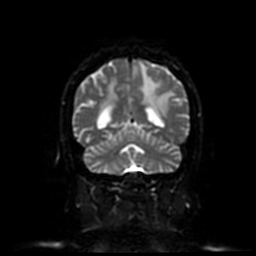
[im 86/86]
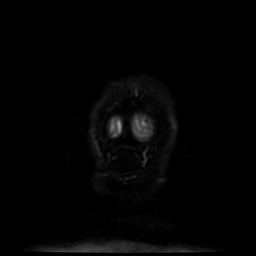

[Series 12: T1 post-contrast · axial · 1.0mm · 0.47mm/px · z∈[-52,+109]mm · 8 of 162 slices shown (1 of 3)]
[im 1/162]
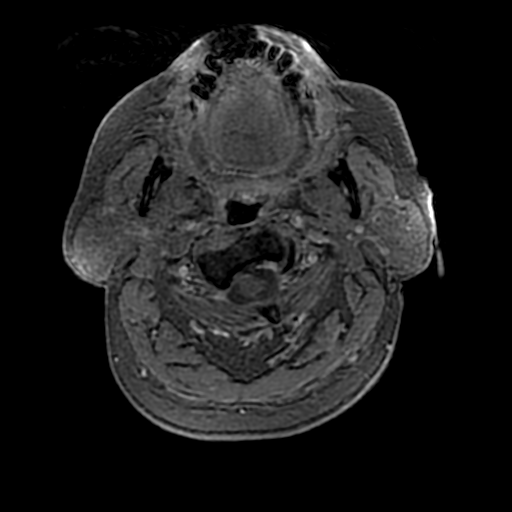
[im 18/162]
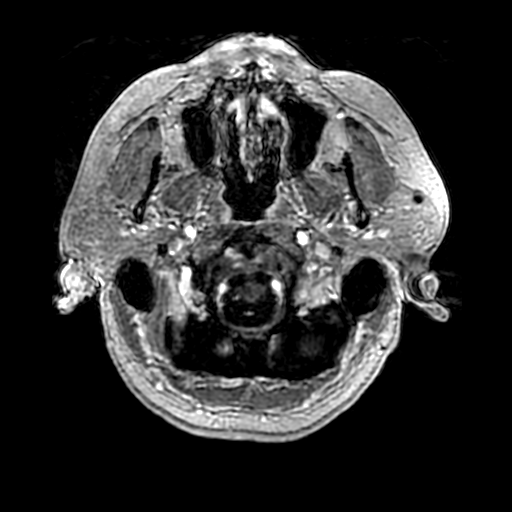
[im 54/162]
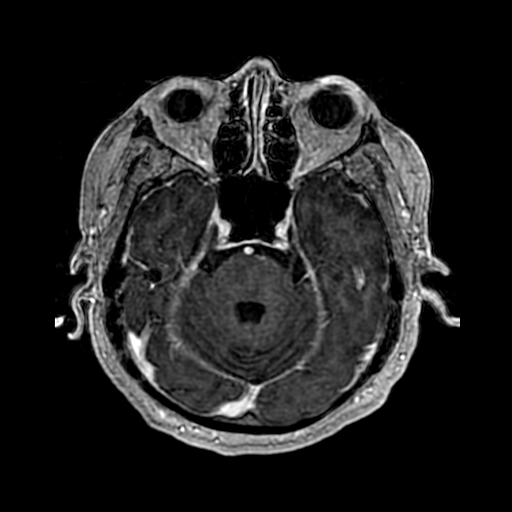
[im 72/162]
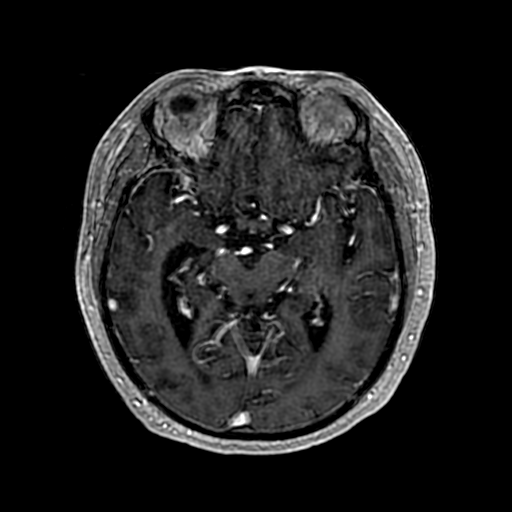
[im 90/162]
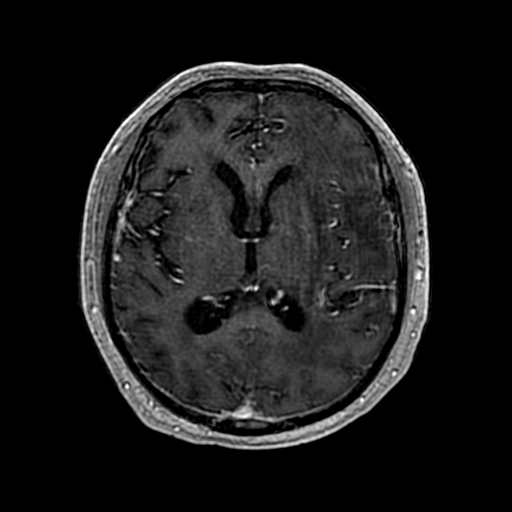
[im 108/162]
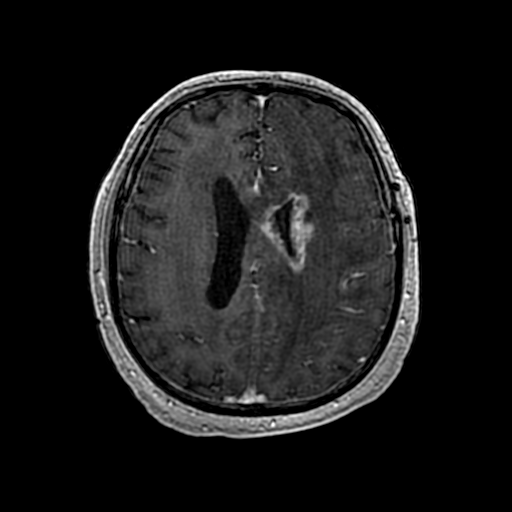
[im 144/162]
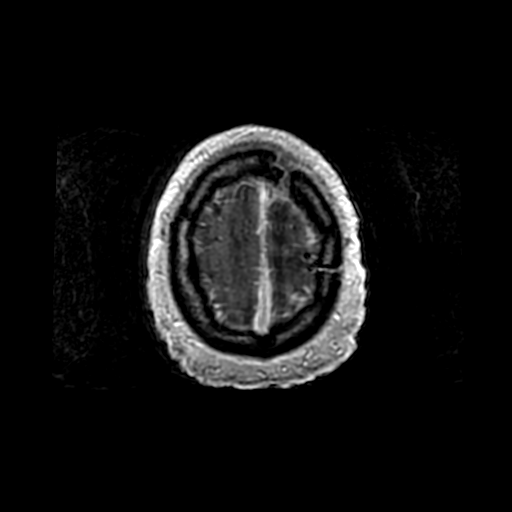
[im 162/162]
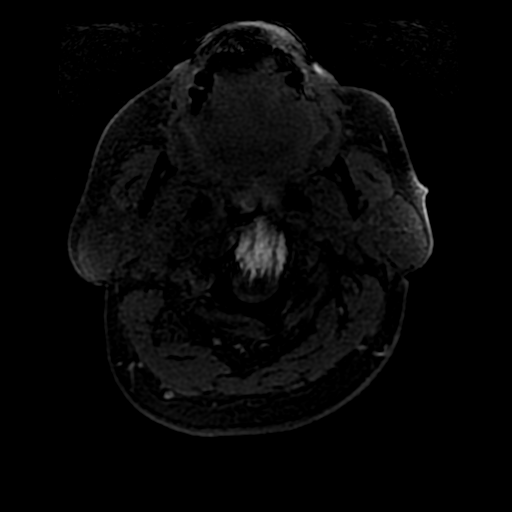

[Series 13: T1 post-contrast · coronal · 5.0mm · 0.43mm/px · 2 of 27 slices shown (2 of 3)]
[im 1/27]
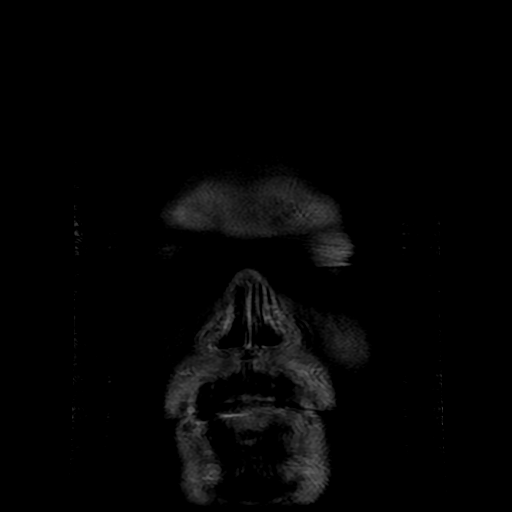
[im 27/27]
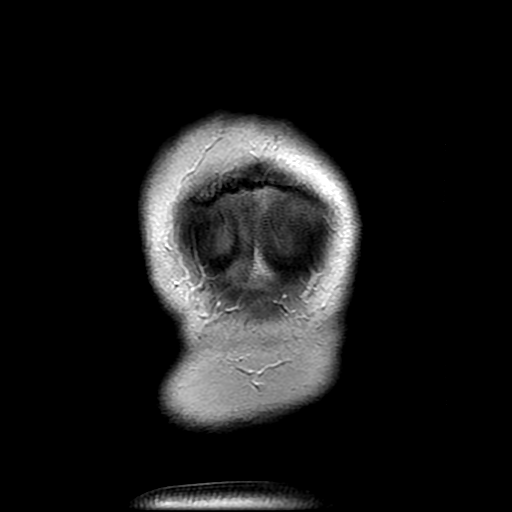

[Series 14: T1 post-contrast · sagittal · 5.0mm · 0.47mm/px · 1 of 22 slices shown (3 of 3)]
[im 1/22]
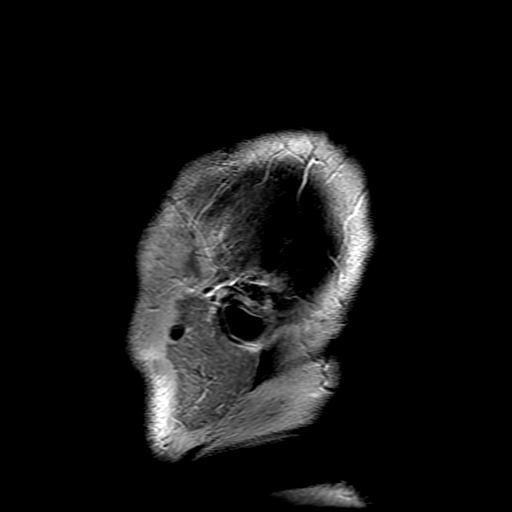

[Series 300: DWI · axial · 3.0mm · 1.09mm/px · z∈[-33,+123]mm · 3 of 53 slices shown (3 of 4)]
[im 1/53]
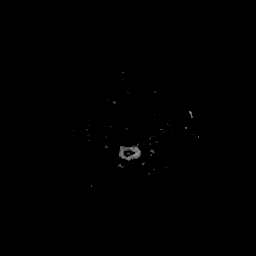
[im 27/53]
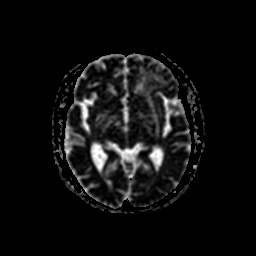
[im 53/53]
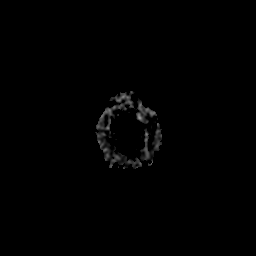

[Series 900: DWI · coronal · 4.0mm · 1.09mm/px · 3 of 43 slices shown (4 of 4)]
[im 1/43]
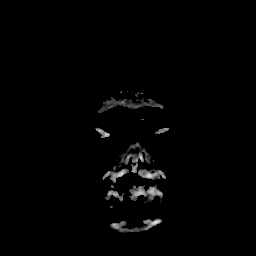
[im 22/43]
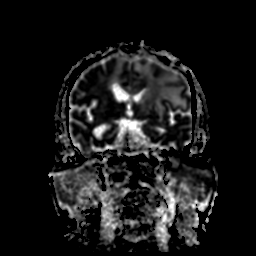
[im 43/43]
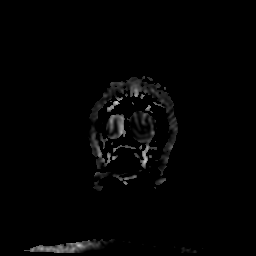

[30 of 48 positions shown; findings below may reference images not displayed]

FINDINGS: Brain: Progressive disease. There is confluent T2 hyperintensity
with mass effect in the left upper cerebral white matter, extending
into deep white matter tracts, with progressive effacement of the
left lateral ventricle. Irregular/necrotic pattern enhancement
around the resection cavity has progressed with new enhancement seen
tracking along the left para median corpus callosum and septum
pellucidum. There is greater corpus callosum thickening on coronal
T2 weighted acquisition. There is confluent FLAIR hyperintensity in
the upper right cerebral white matter with possible increased
fullness on coronal imaging.

No superimposed acute infarct, hemorrhage, hydrocephalus, or
herniation.

Vascular: Major flow voids and vascular enhancements are preserved

Skull and upper cervical spine: Unremarkable left frontal
craniotomy. Disc and facet degeneration in the visible cervical
spine.

Sinuses/Orbits: Negative
IMPRESSION: Progressive disease with increased T2 signal, swelling, and
enhancement.

## 2020-06-19 IMAGING — MR MR HEAD WO/W CM
7 of 13 series · 30 of 48 positions shown · IV contrast (gadavist)
Comparison: 03/20/2019 and earlier.

CLINICAL DATA: 78-year-old male with glioblastoma treated with
resection and IMRT in 8849. Restaging.

EXAM:
MRI HEAD WITHOUT AND WITH CONTRAST
TECHNIQUE: Multiplanar, multiecho pulse sequences of the brain and surrounding
structures were obtained without and with intravenous contrast.
CONTRAST:  10mL GADAVIST GADOBUTROL 1 MMOL/ML IV SOLN

[Series 4: DWI · axial · 3.0mm · 1.09mm/px · z∈[-1,+134]mm · 6 of 92 slices shown (1 of 4)]
[im 1/92]
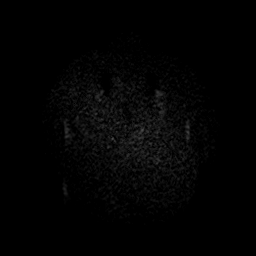
[im 19/92]
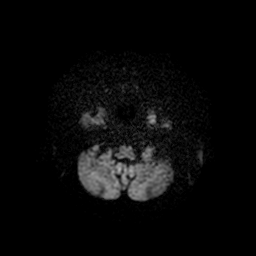
[im 37/92]
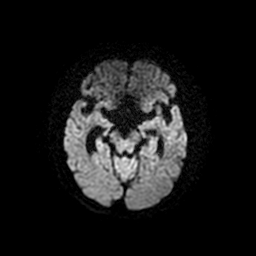
[im 55/92]
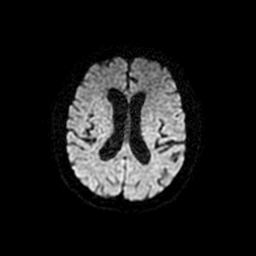
[im 73/92]
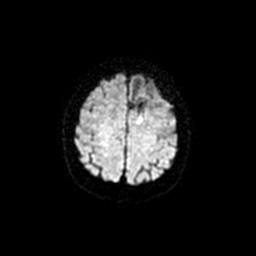
[im 92/92]
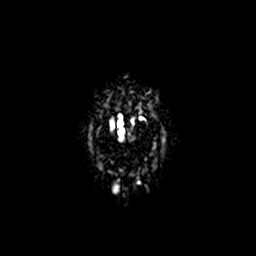

[Series 6: FLAIR · axial · 3.0mm · 0.43mm/px · z∈[-4,+134]mm · 2 of 24 slices shown]
[im 1/24]
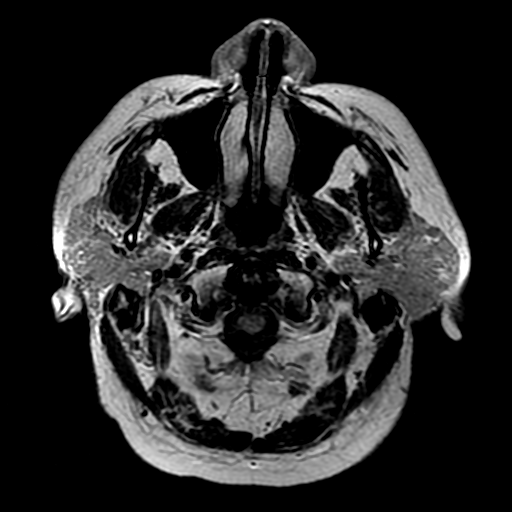
[im 24/24]
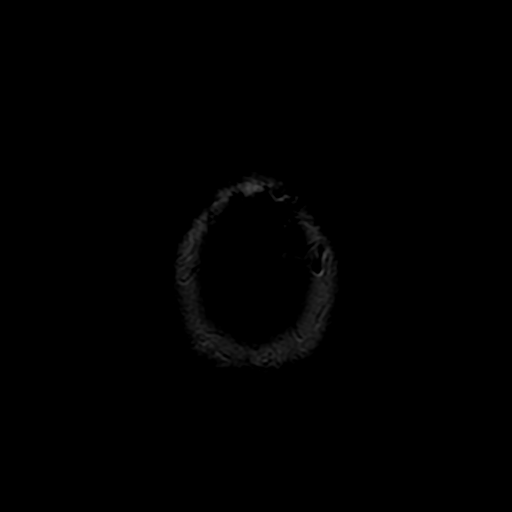

[Series 9: DWI · coronal · 4.0mm · 1.09mm/px · 6 of 82 slices shown (2 of 4)]
[im 1/82]
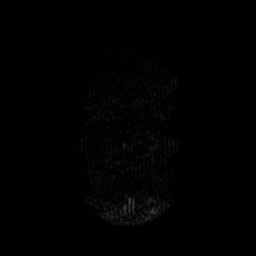
[im 17/82]
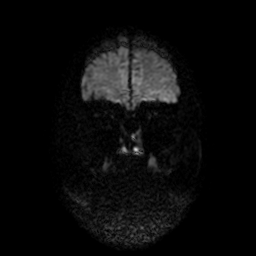
[im 33/82]
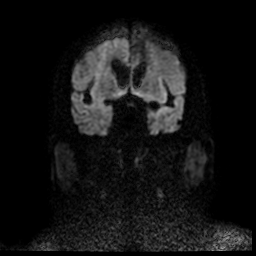
[im 49/82]
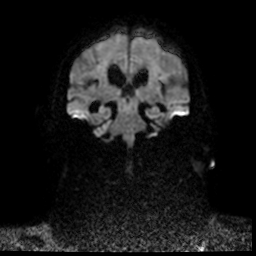
[im 65/82]
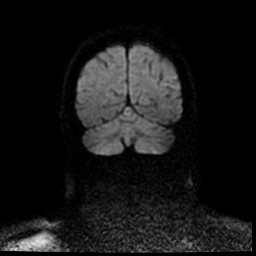
[im 82/82]
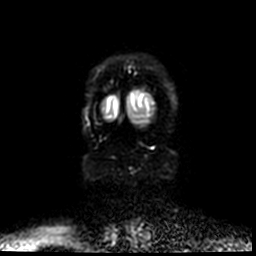

[Series 11: T1 post-contrast · axial · 1.0mm · 0.47mm/px · z∈[+0,+131]mm · 8 of 132 slices shown (1 of 2)]
[im 1/132]
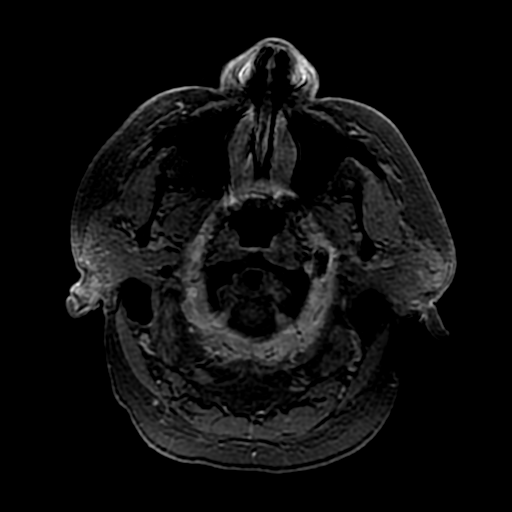
[im 17/132]
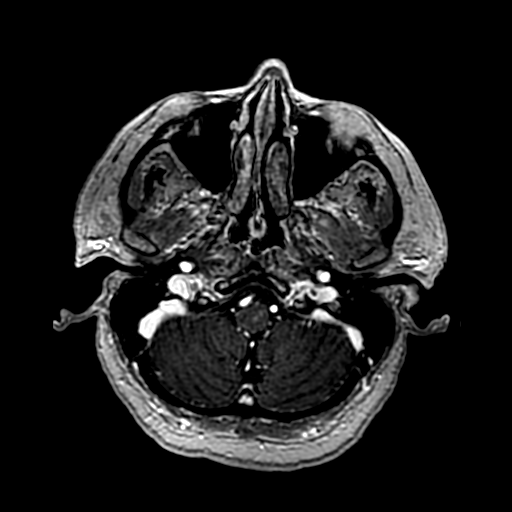
[im 33/132]
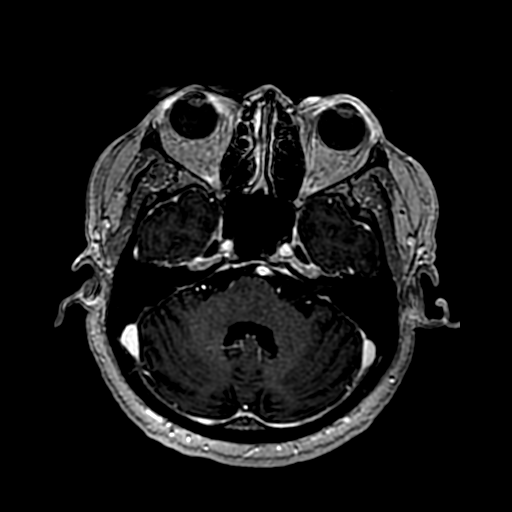
[im 50/132]
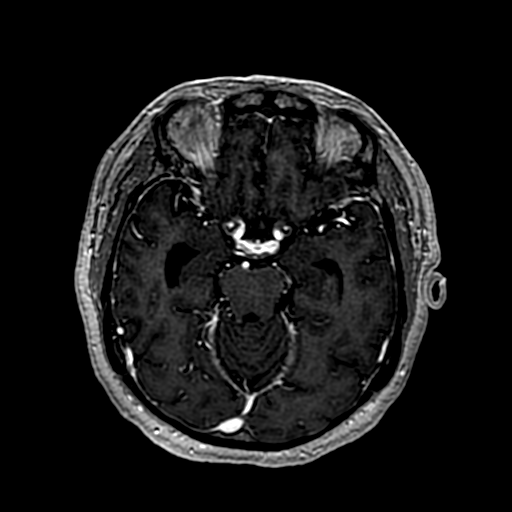
[im 82/132]
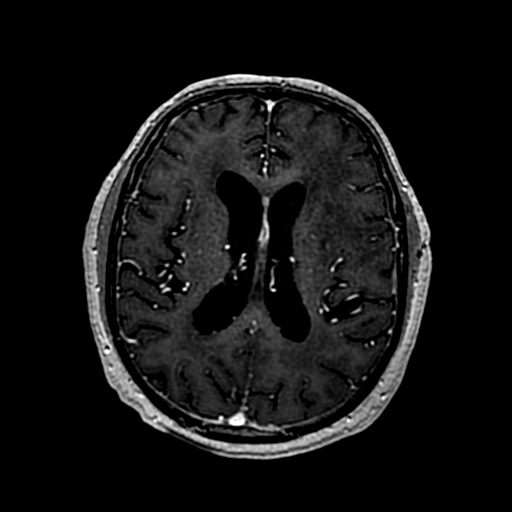
[im 99/132]
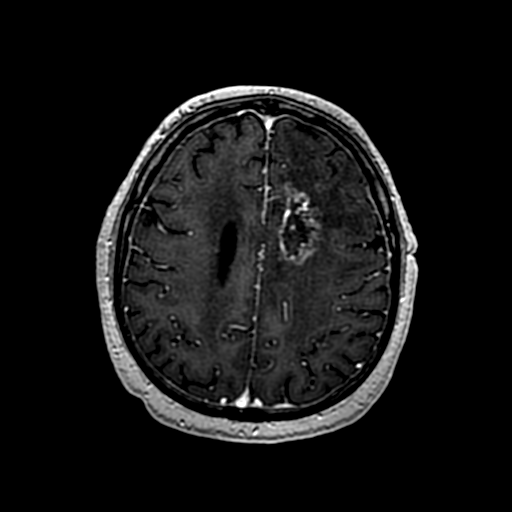
[im 115/132]
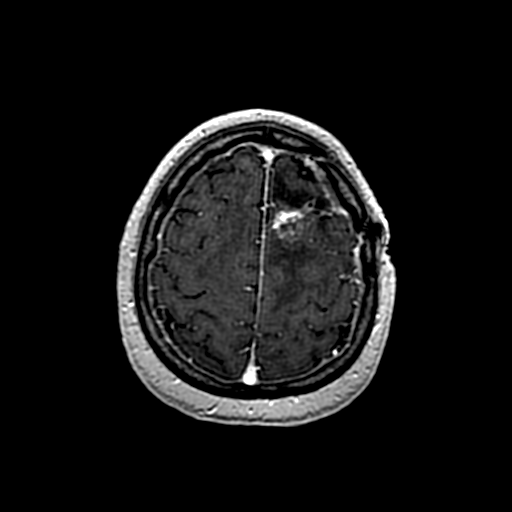
[im 132/132]
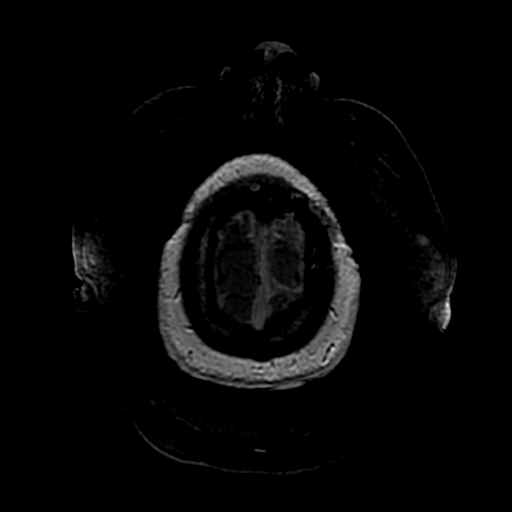

[Series 12: T1 post-contrast · coronal · 5.0mm · 0.43mm/px · 2 of 26 slices shown (2 of 2)]
[im 1/26]
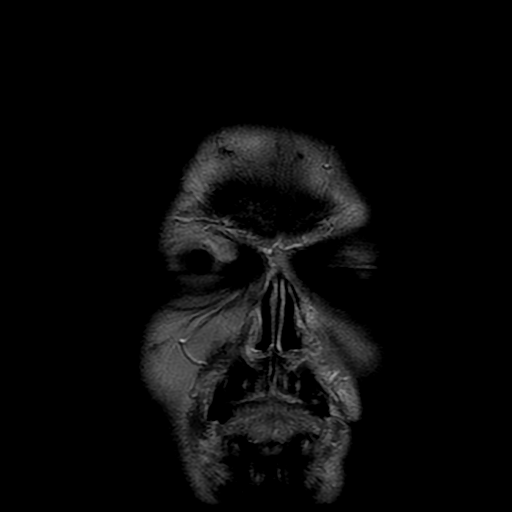
[im 26/26]
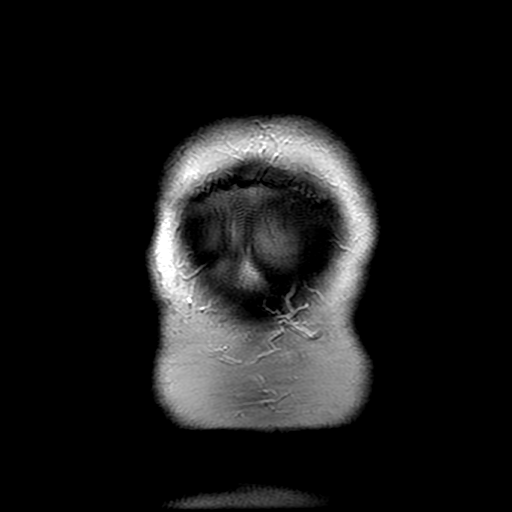

[Series 400: DWI · axial · 3.0mm · 1.09mm/px · z∈[-1,+134]mm · 3 of 46 slices shown (3 of 4)]
[im 1/46]
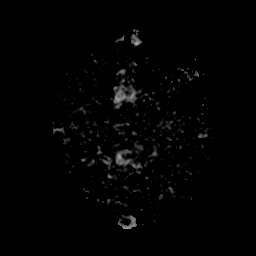
[im 23/46]
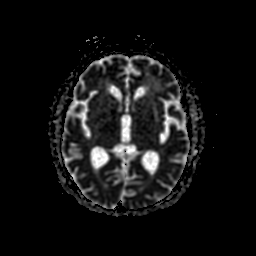
[im 46/46]
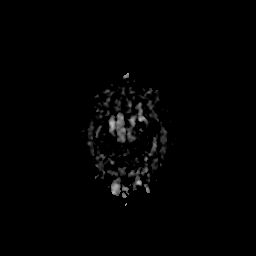

[Series 900: DWI · coronal · 4.0mm · 1.09mm/px · 3 of 41 slices shown (4 of 4)]
[im 1/41]
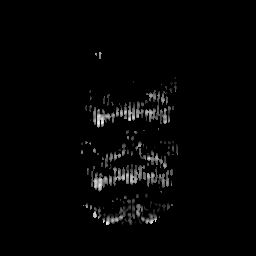
[im 21/41]
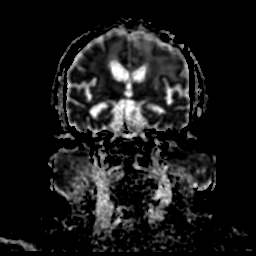
[im 41/41]
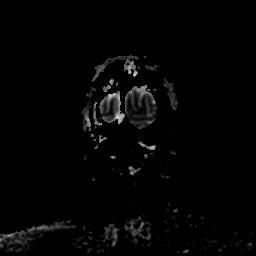

[30 of 48 positions shown; findings below may reference images not displayed]

FINDINGS: Brain: Decreased thickness of nodular resection cavity enhancement
in the anterior and superior left frontal lobe compared to
03/20/2019. And the overall size of the enhancing lesion has not
significantly changed (35 by 28 x 32 millimeters - AP by transverse
by CC - versus 34 by 27 by 35 millimeters in [REDACTED]).

As before the lesion and enhancement is inseparable from the left
frontal horn (series 12, image 16 today), and abnormal enhancement
continues into the body of the corpus callosum in the midline
(series 11, image 88). However, no ependymal enhancement extending
from that area.

Widespread abnormal T2 and FLAIR hyperintense signal in both
hemispheres left greater than right persists, but has regressed some
in the left parietal lobe (series 6, image 18). T2 and FLAIR
hyperintensity has also substantially regressed in the region of the
left deep gray nuclei (series 10, image 14 today versus same series
and image in [REDACTED]). Left lateral ventricle patency has also
mildly improved. No additional enhancement identified.

On diffusion a small area of restriction along the posterior margin
of the cavity has not significantly changed (series 4, image 38).

Stable mild left superior convexity hemosiderin, and small
postoperative extra-axial collection underlying the craniotomy. No
intracranial mass effect. No superimposed restricted diffusion
suggestive of acute infarction. No ventriculomegaly, or acute
intracranial hemorrhage. Cervicomedullary junction and pituitary are
within normal limits.

Vascular: Major intracranial vascular flow voids are stable. The
major dural venous sinuses are enhancing and appear to be patent.

Skull and upper cervical spine: Stable cervical spine degeneration.
Stable visible osseous structures.

Sinuses/Orbits: Negative orbits. Stable and well pneumatized
paranasal sinuses and mastoids.

Other: Visible internal auditory structures appear normal. Scalp and
face soft tissues are stable.
IMPRESSION: 1. Regression of tumor since [REDACTED], including mildly decreased
thickness enhancement about the resection cavity, and decreased
T2/FLAIR in the left deep gray nuclei.
2. No new intracranial abnormality.

## 2020-06-24 IMAGING — CT CT CERVICAL SPINE W/O CM
3 of 4 series · 9 of 33 positions shown, 11 images · non-contrast
Comparison: None.

CLINICAL DATA: Fall.

EXAM:
CT CERVICAL SPINE WITHOUT CONTRAST
TECHNIQUE: Multidetector CT imaging of the cervical spine was performed without
intravenous contrast. Multiplanar CT image reconstructions were also
generated.

[Series 5: c_spine 2.0 st · axial · 0.32mm/px · z∈[-139,-139]mm · 1 of 111 slices shown, 2 images]
[im 56/111  soft-tissue]
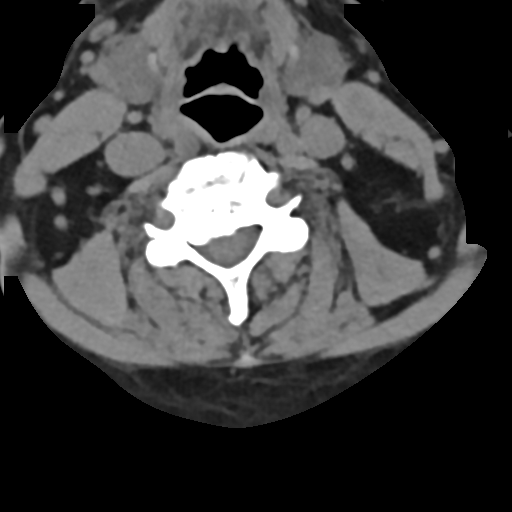
[im 56/111  bone]
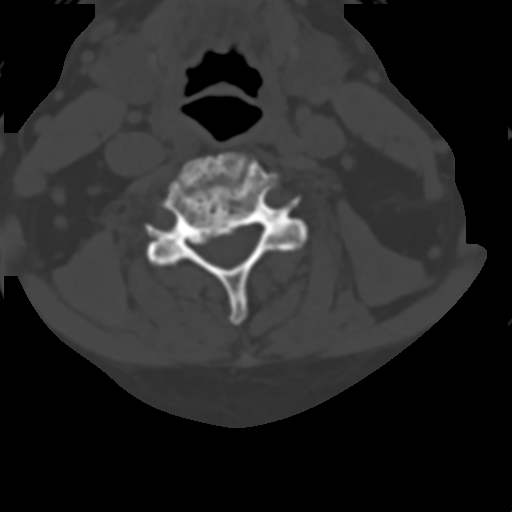

[Series 10: coronal bone · coronal · 0.25mm/px · 3 of 61 slices shown]
[im 13/61  bone]
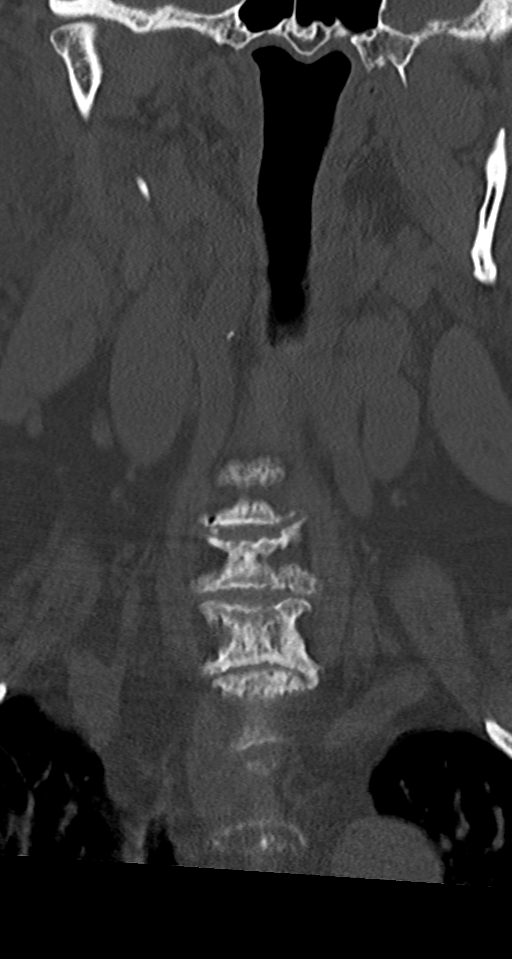
[im 25/61  bone]
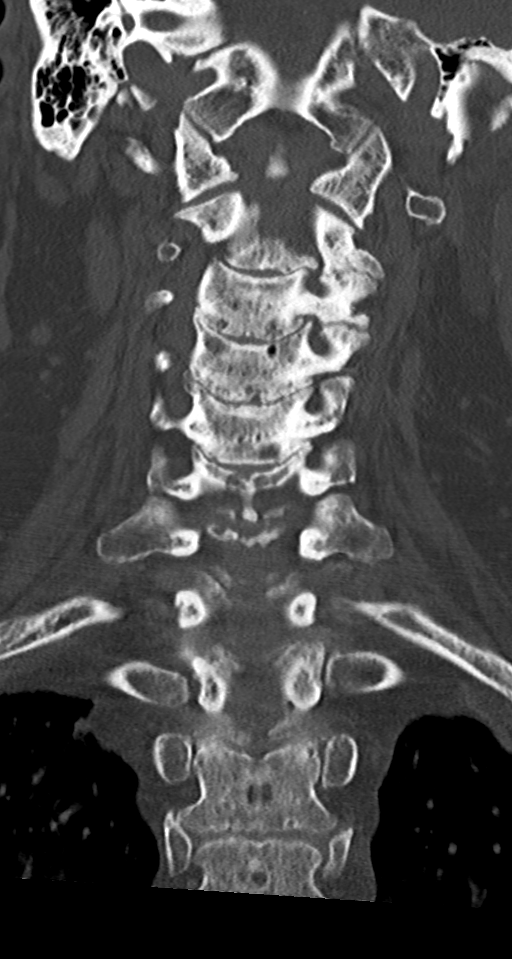
[im 37/61  bone]
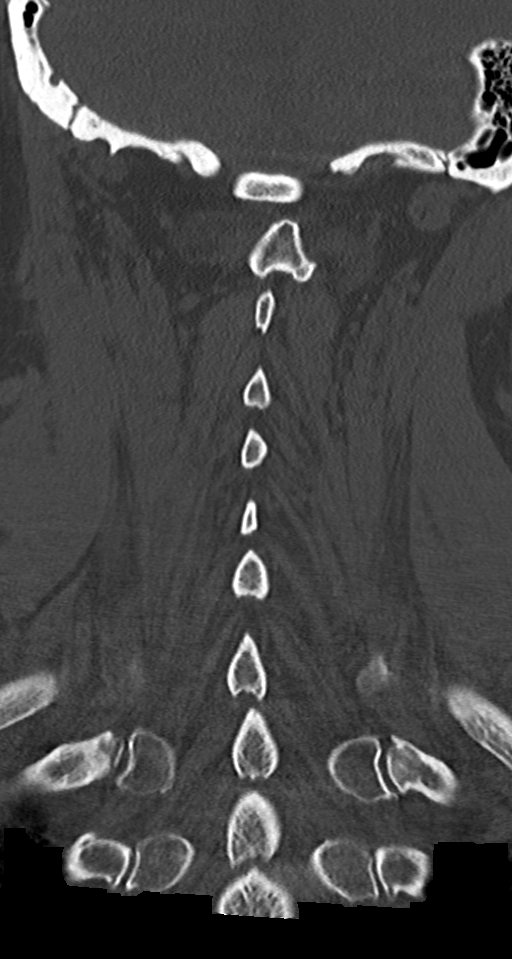

[Series 11: sagittal bone · sagittal · 0.25mm/px · 5 of 61 slices shown, 6 images]
[im 21/61  bone]
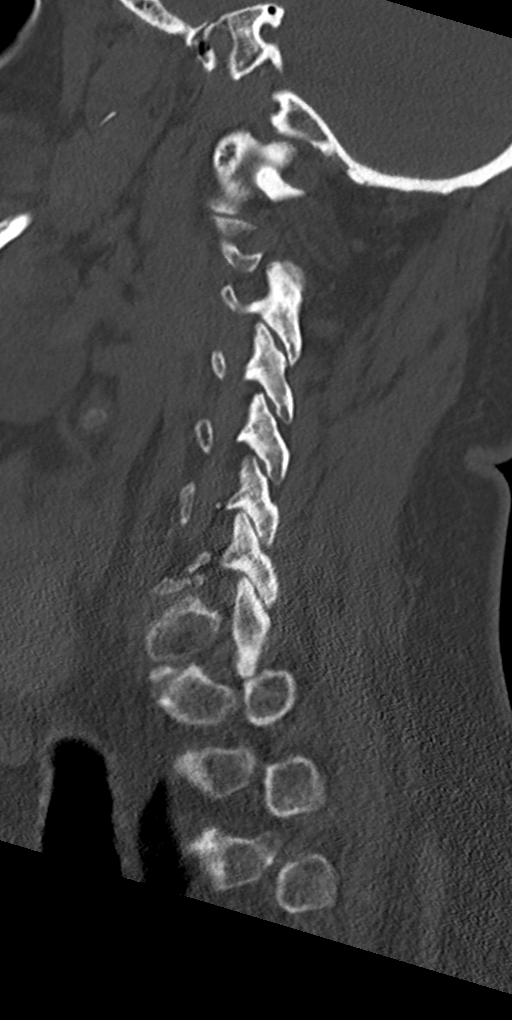
[im 26/61  bone]
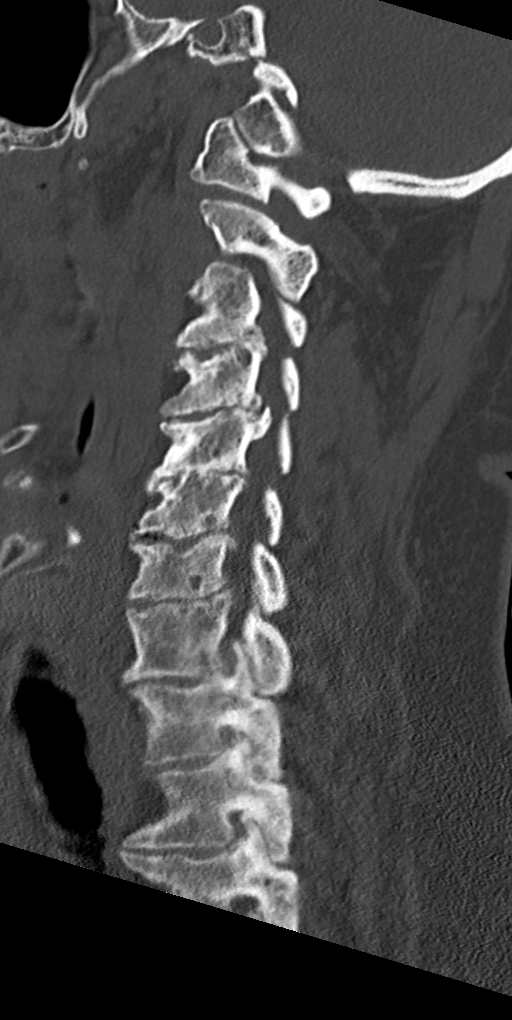
[im 31/61  soft-tissue]
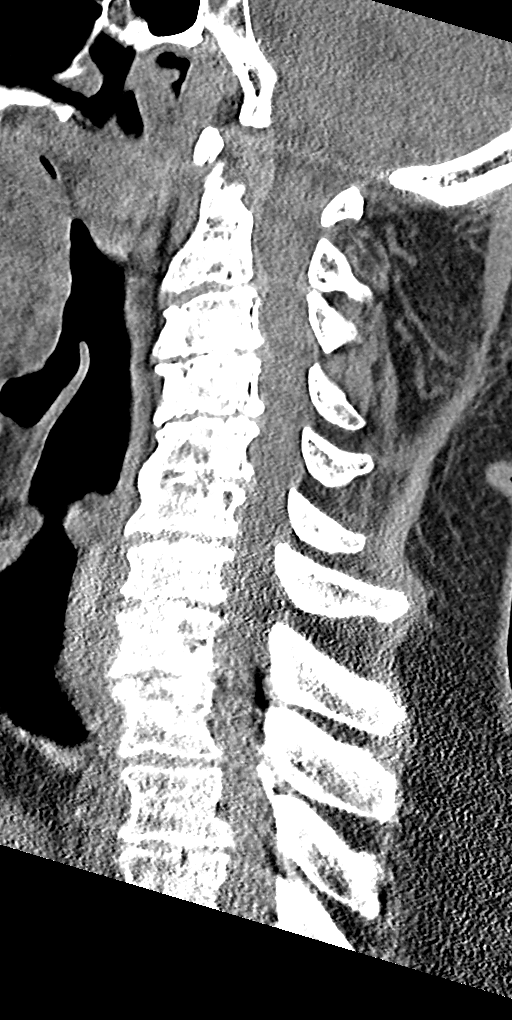
[im 31/61  bone]
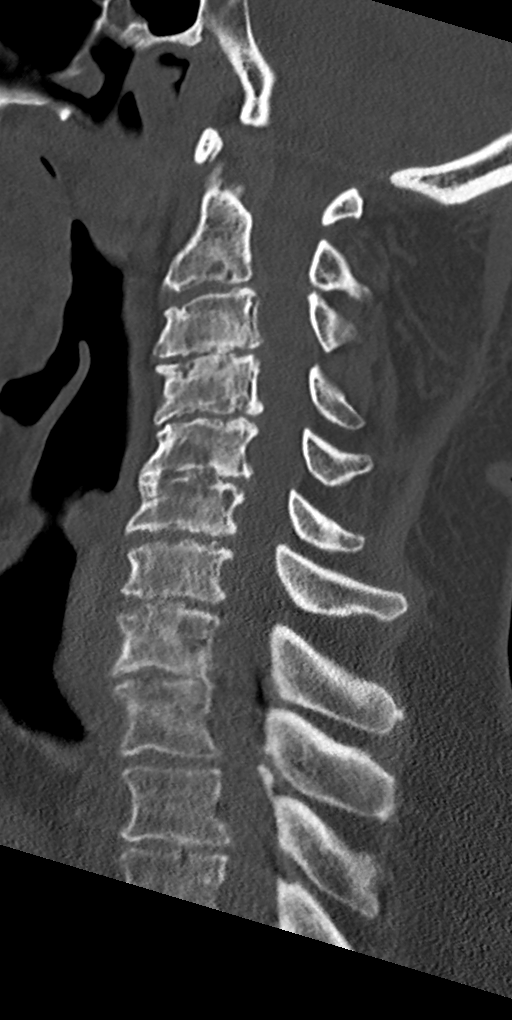
[im 36/61  bone]
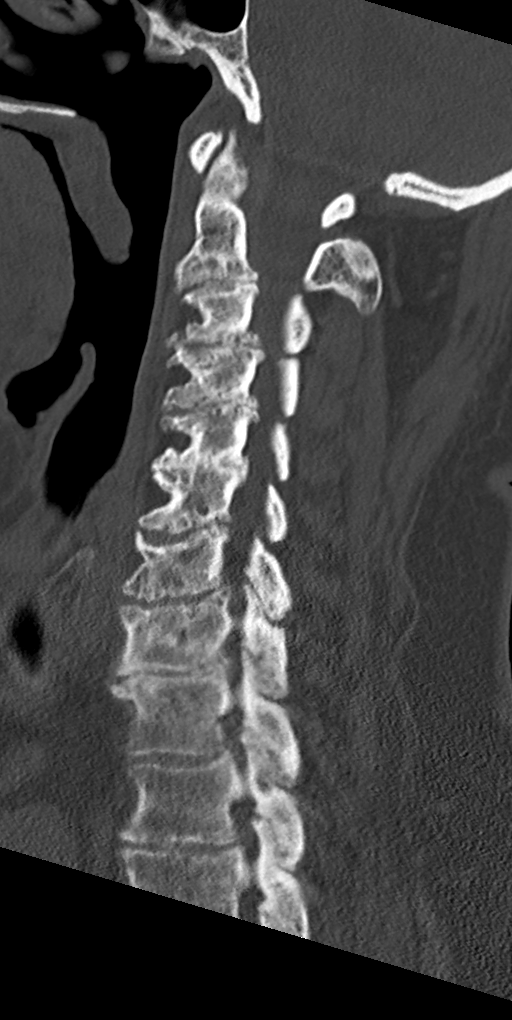
[im 41/61  bone]
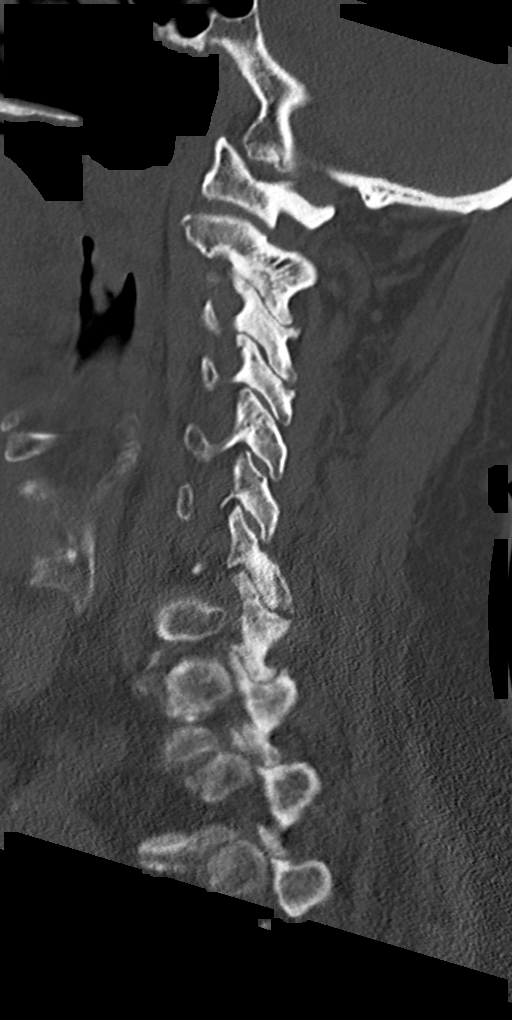

[9 of 33 positions shown; findings below may reference images not displayed]

FINDINGS: Alignment: Straightening of normal cervical lordosis.

Skull base and vertebrae: No acute fracture. No primary bone lesion
or focal pathologic process.

Soft tissues and spinal canal: No prevertebral fluid or swelling. No
visible canal hematoma.

Disc levels: Diffuse loss of intervertebral disc height is seen at
C3-4, C4-5, and C5-6. Marked loss of disc height also noted C7-T1.
Facet osteoarthritis is most advanced at lower levels on the left.

Upper chest: Unremarkable.

Other: None.
IMPRESSION: No evidence for cervical spine fracture. Loss of cervical lordosis.
This can be related to patient positioning, muscle spasm or soft
tissue injury.

Diffuse degenerative disc disease.

## 2020-06-24 IMAGING — CT CT HEAD W/O CM
3 series · 15 of 47 positions shown, 18 images · non-contrast
Comparison: CT January 2019

CLINICAL DATA: Fall, glioblastoma

EXAM:
CT HEAD WITHOUT CONTRAST
TECHNIQUE: Contiguous axial images were obtained from the base of the skull
through the vertex without intravenous contrast.

[Series 3: head 5.0 h30s · axial · 0.42mm/px · z∈[-47,+83]mm · 9 of 32 slices shown, 12 images]
[im 3/32  brain]
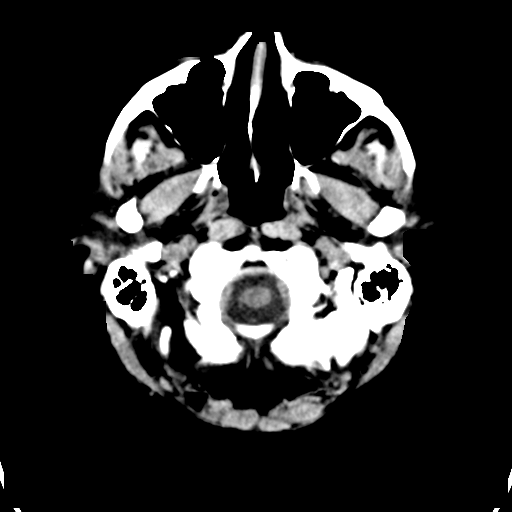
[im 3/32  bone]
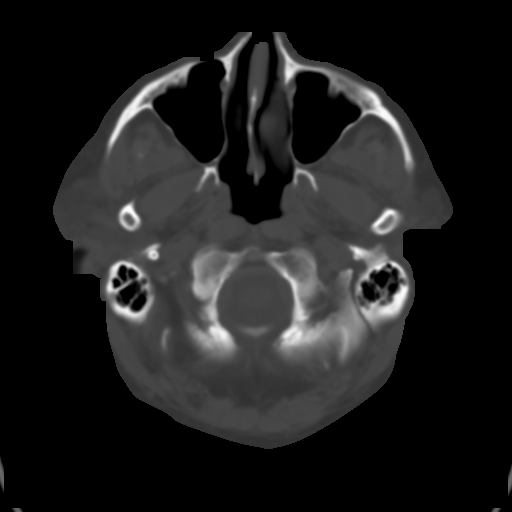
[im 6/32  brain]
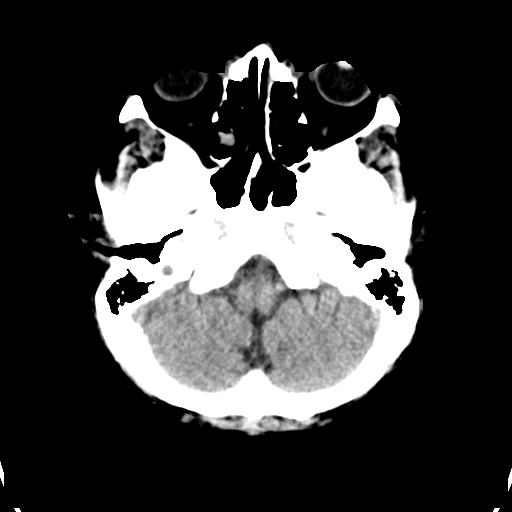
[im 9/32  brain]
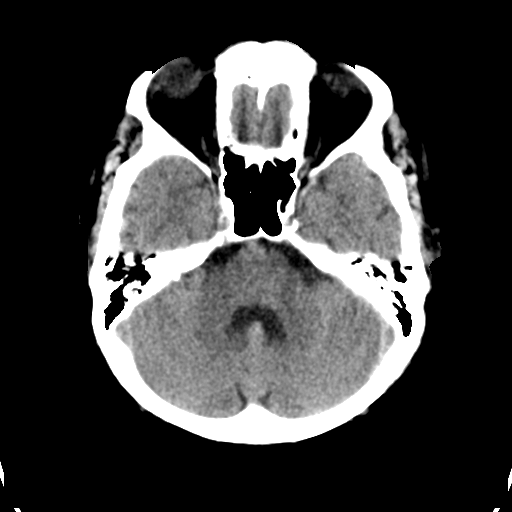
[im 12/32  brain]
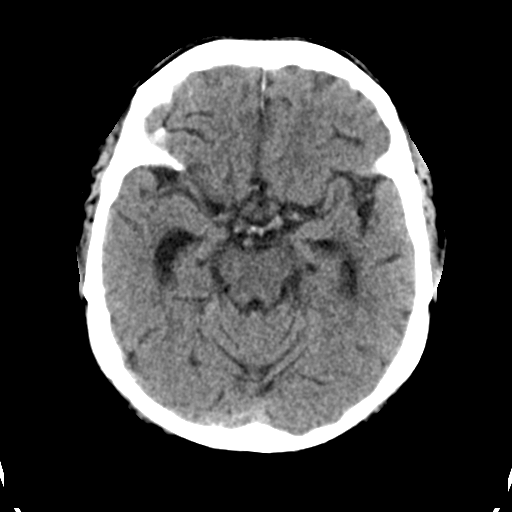
[im 17/32  brain]
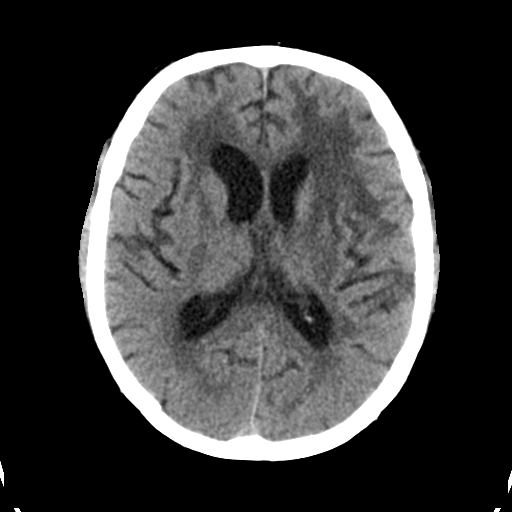
[im 17/32  bone]
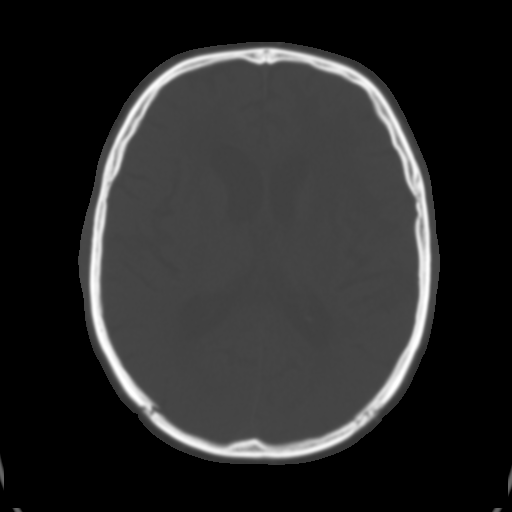
[im 20/32  brain]
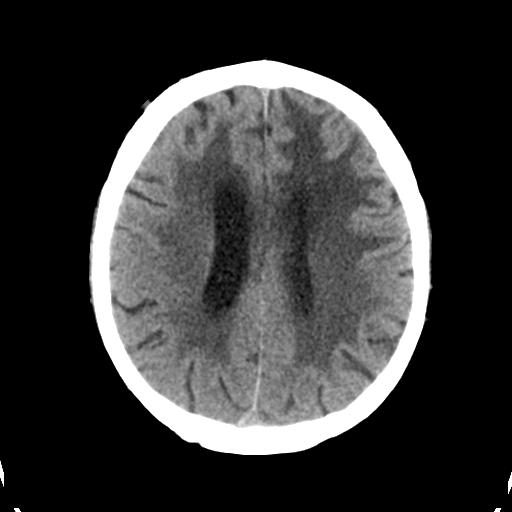
[im 23/32  brain]
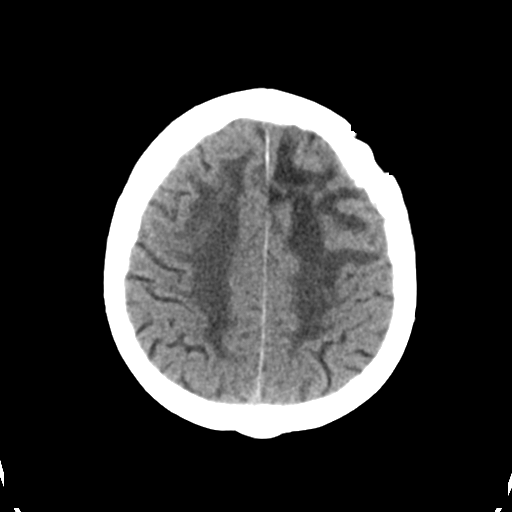
[im 26/32  brain]
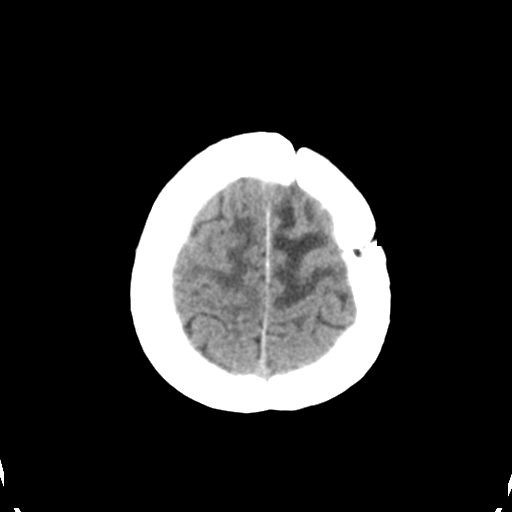
[im 29/32  brain]
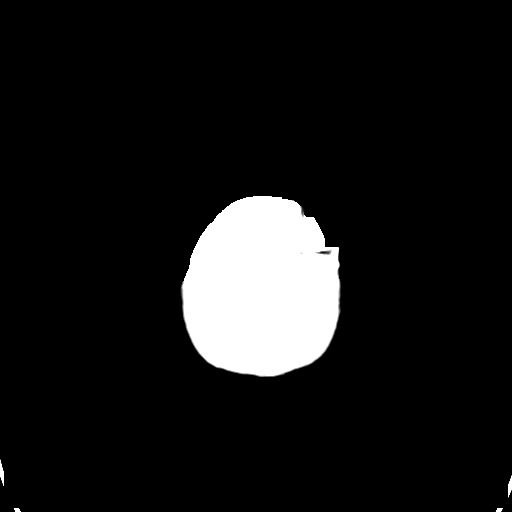
[im 29/32  bone]
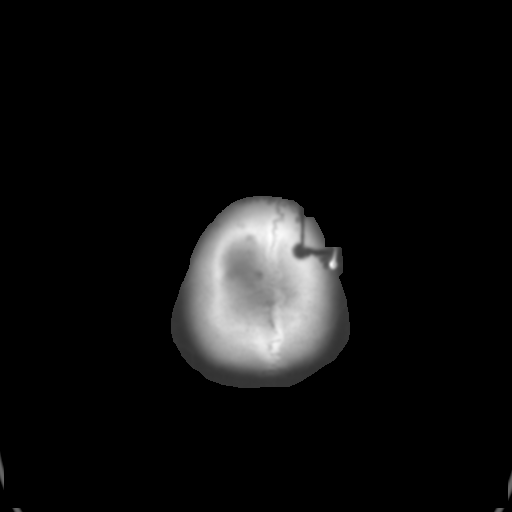

[Series 5: head 3.0 mpr cor · coronal · 0.31mm/px · 3 of 69 slices shown]
[im 23/69  brain]
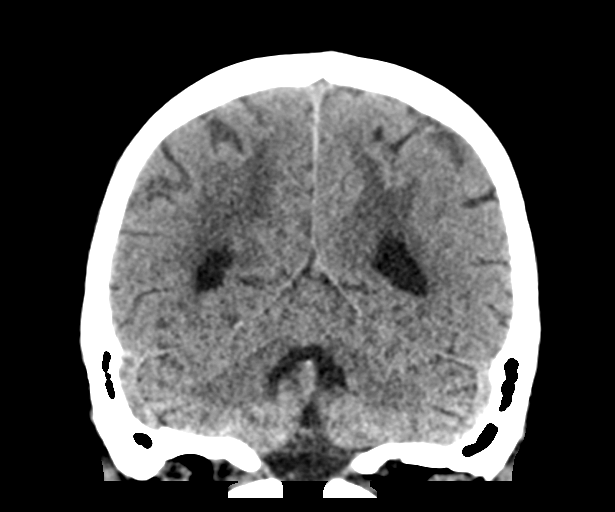
[im 31/69  brain]
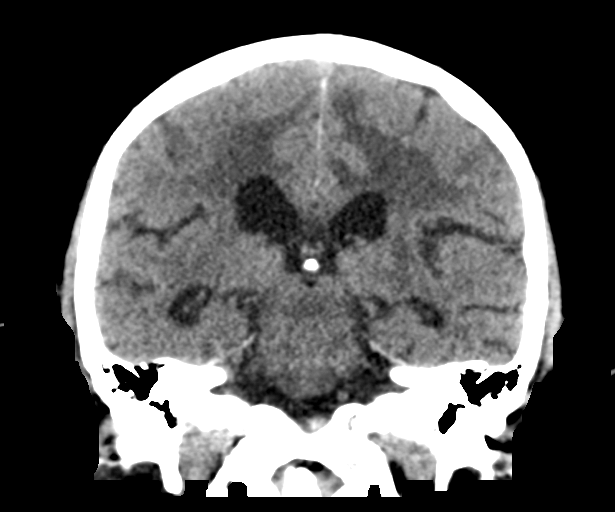
[im 38/69  brain]
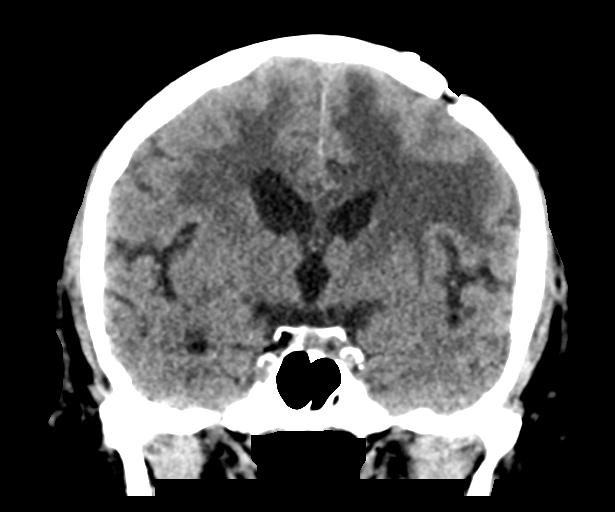

[Series 6: head 3.0 mpr sag · sagittal · 0.31mm/px · 3 of 67 slices shown]
[im 23/67  brain]
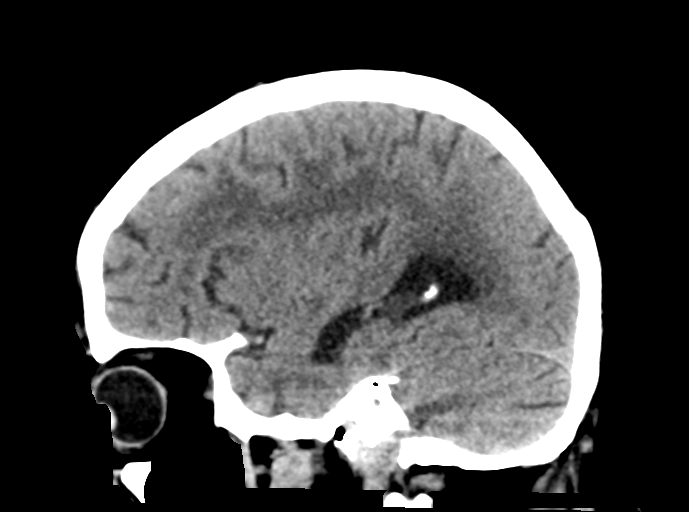
[im 34/67  brain]
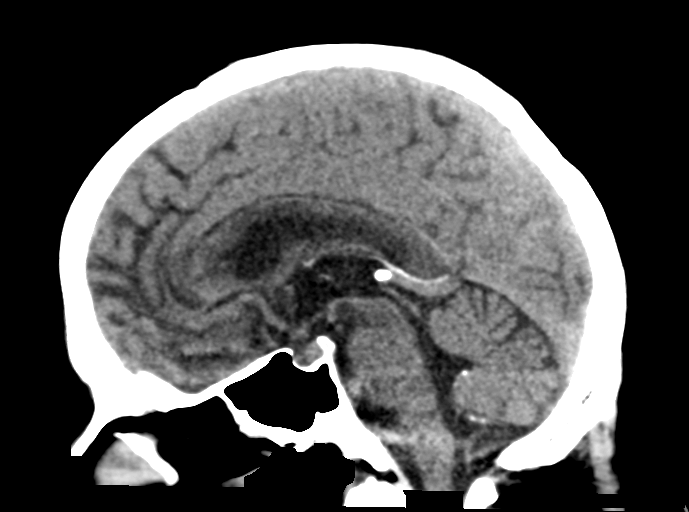
[im 45/67  brain]
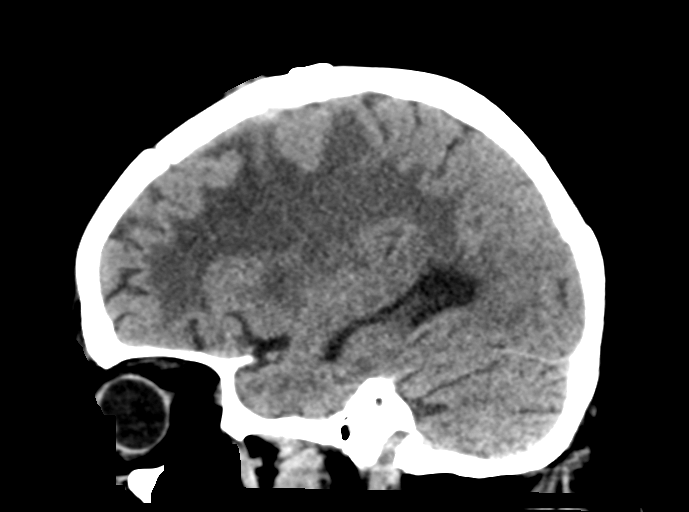

[15 of 47 positions shown; findings below may reference images not displayed]

FINDINGS: Brain: There is no acute intracranial hemorrhage. Heterogeneous left
parasagittal frontal lesion is better evaluated on MRI. Extent of
confluent hypoattenuation in the left greater than right cerebral
hemispheres is similar in extent. Partial effacement of the left
lateral ventricle is unchanged. There is no new loss of gray-white
differentiation.

Vascular: Intracranial atherosclerotic calcification at the skull
base.

Skull: Postoperative changes of left frontal craniotomy.

Sinuses/Orbits: Minor paranasal sinus mucosal thickening. Orbits are
unremarkable.

Other: Mastoid air cells are clear.
IMPRESSION: No evidence of acute intracranial injury. Additional findings better
evaluated on recent MRI detailed above.

## 2020-06-24 IMAGING — DX DG CHEST 2V
2 series · 2 of 2 positions shown · non-contrast
Comparison: None.

CLINICAL DATA: Syncope.

EXAM:
CHEST - 2 VIEW

[chest pa]
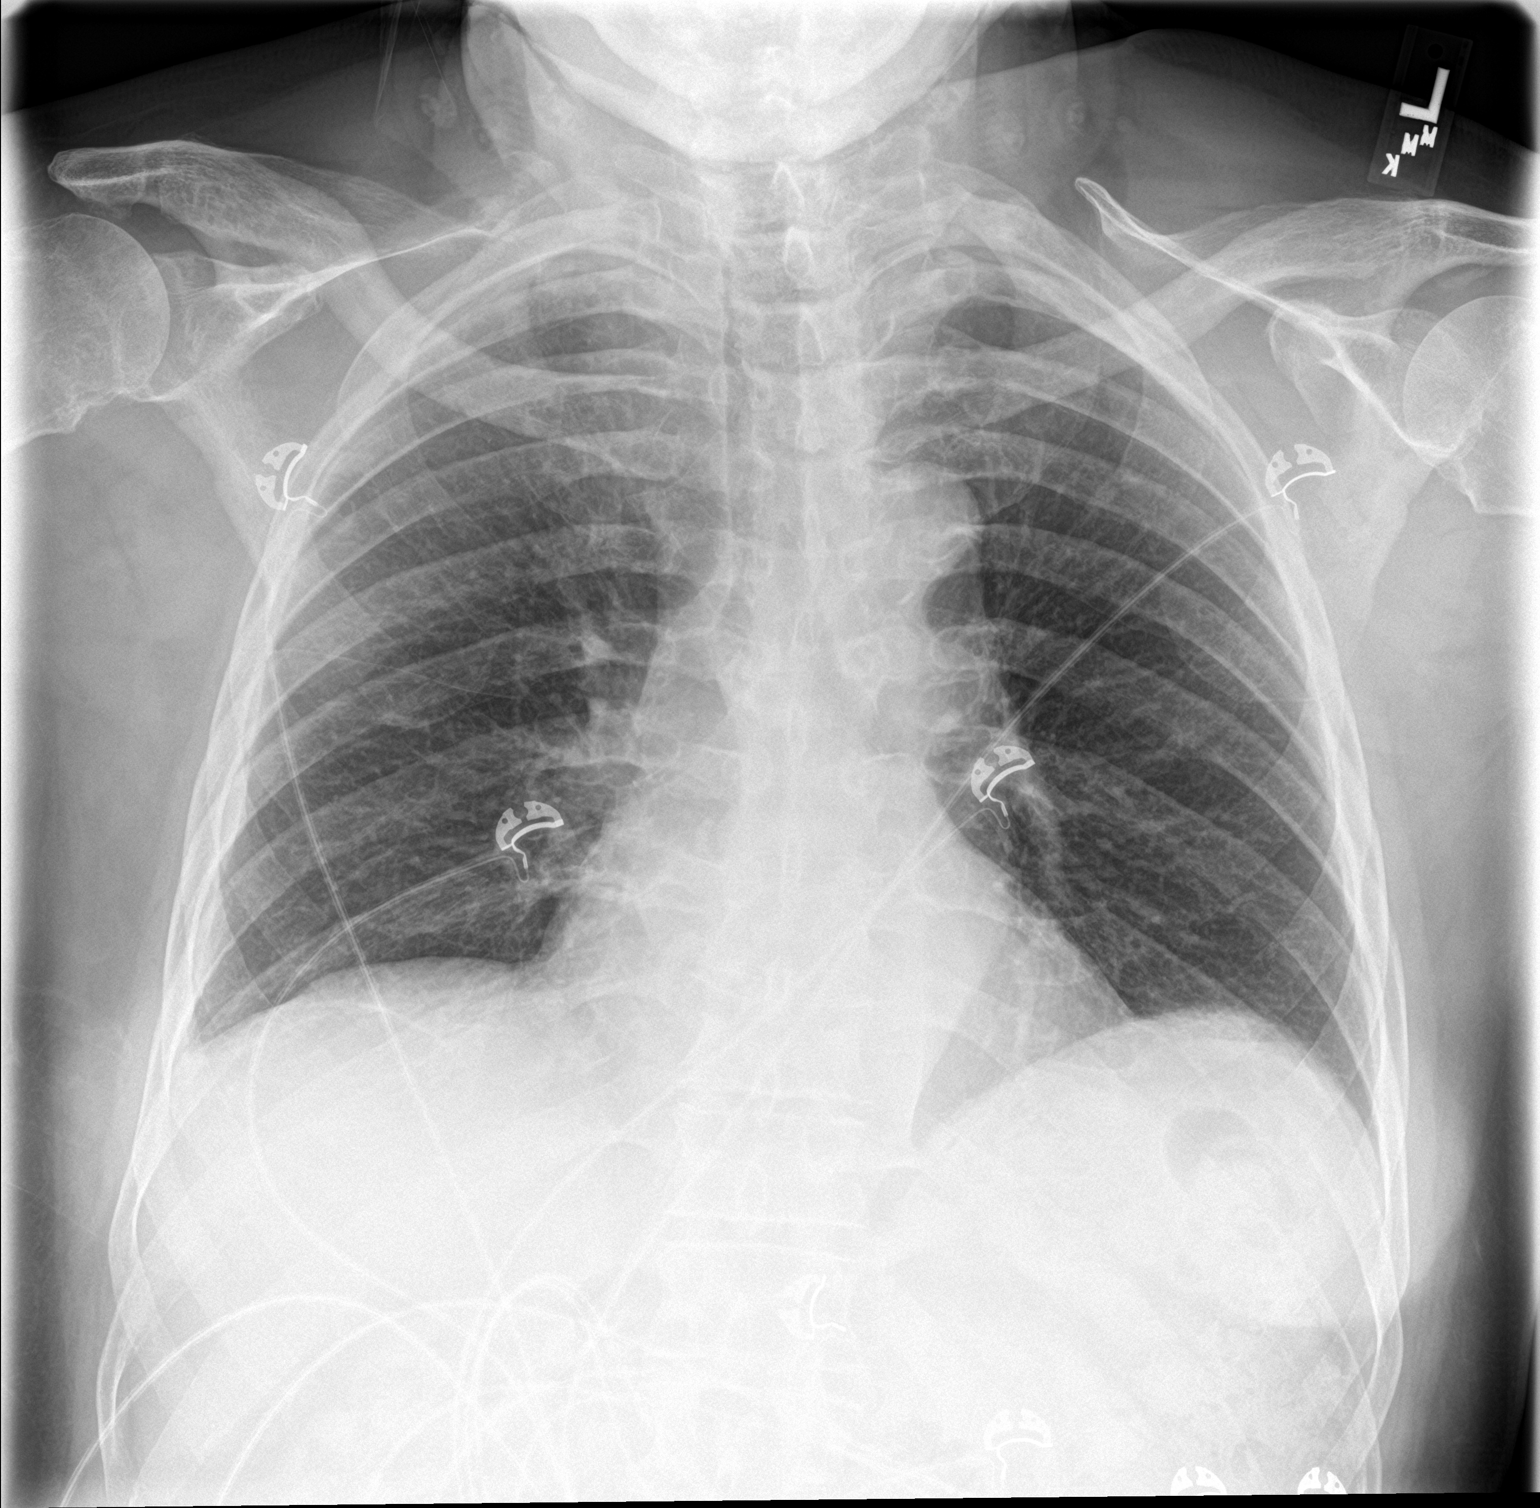

[chest lat]
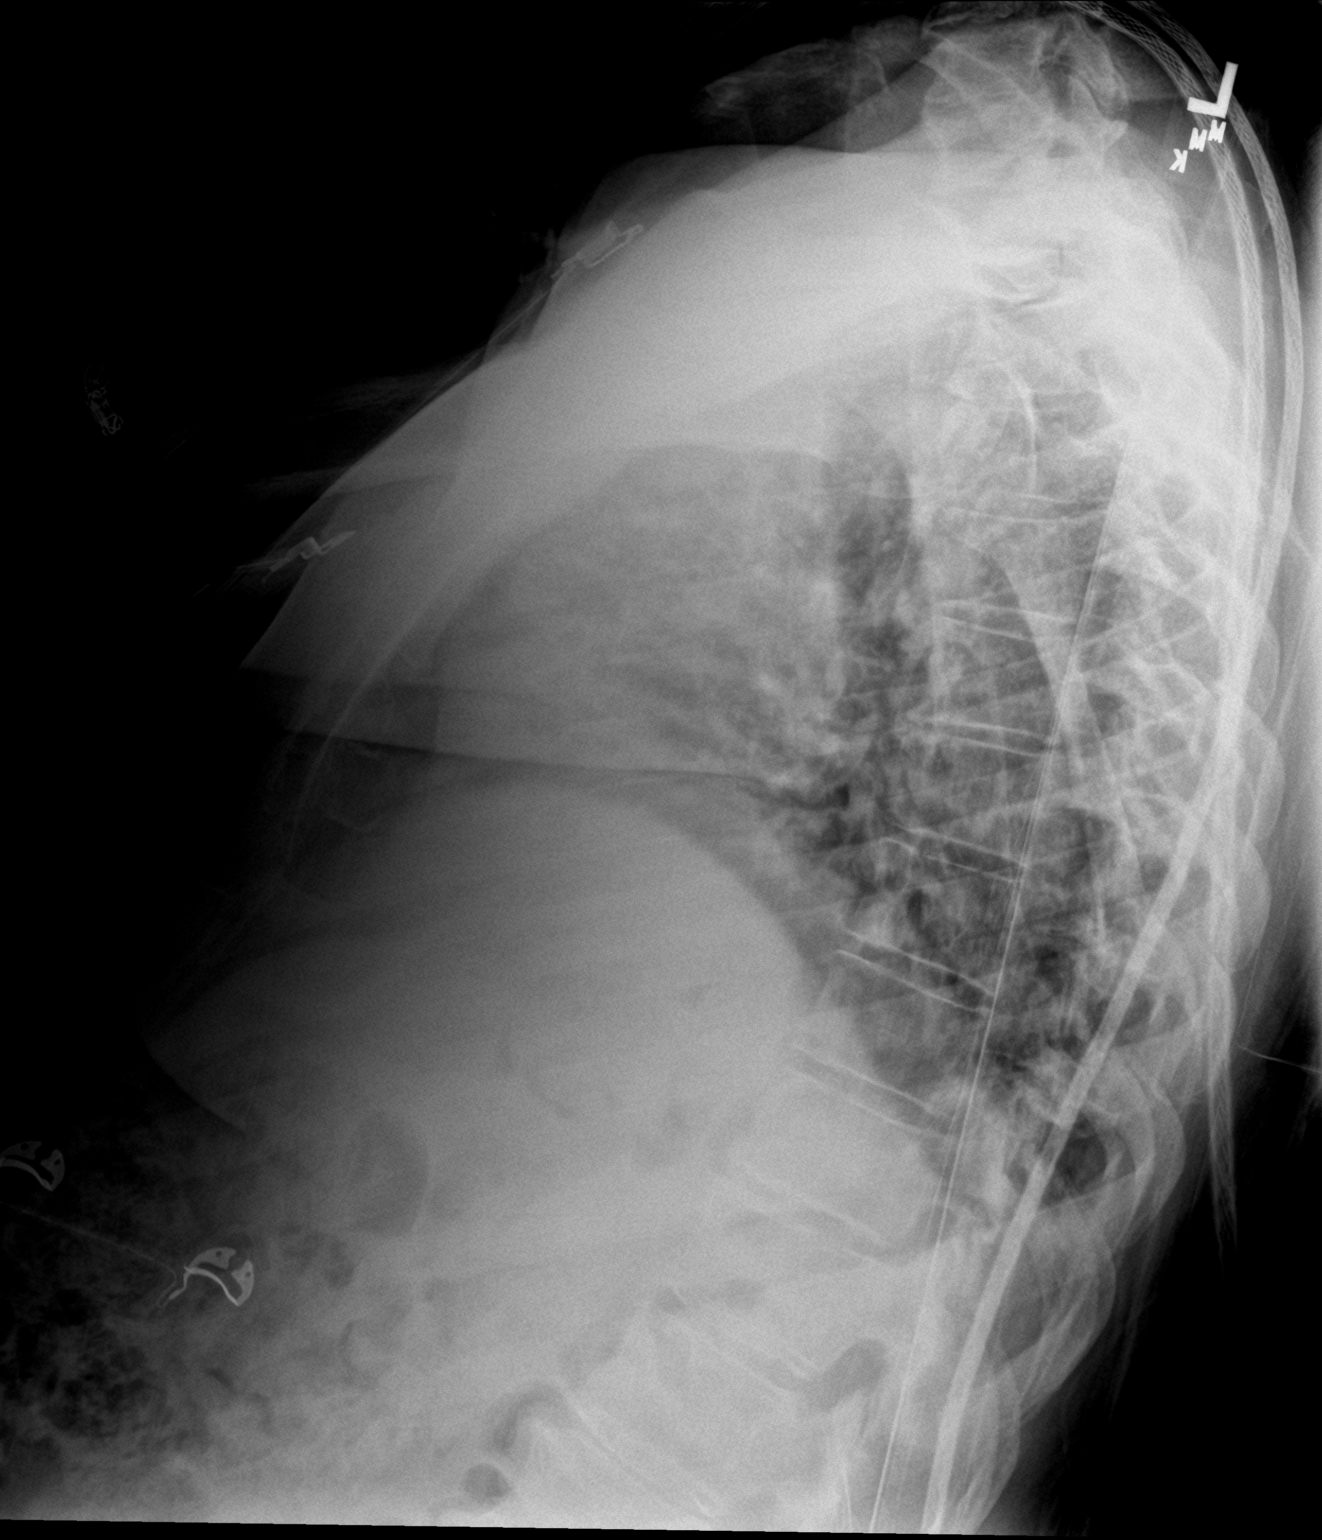

[2 of 2 positions shown; findings below may reference images not displayed]

FINDINGS: The heart size and mediastinal contours are within normal limits.
Both lungs are clear. The visualized skeletal structures are
unremarkable.
IMPRESSION: No active cardiopulmonary disease.

## 2020-08-20 IMAGING — MR MR HEAD WO/W CM
8 of 14 series · 32 of 48 positions shown · IV contrast (gadavist)
Comparison: Brain MRI 05/22/2019 and earlier.

CLINICAL DATA: 78-year-old male with glioblastoma treated with
resection and IMRT in 8477. Progressive leg weakness. Restaging.

EXAM:
MRI HEAD WITHOUT AND WITH CONTRAST
TECHNIQUE: Multiplanar, multiecho pulse sequences of the brain and surrounding
structures were obtained without and with intravenous contrast.
CONTRAST:  10mL GADAVIST GADOBUTROL 1 MMOL/ML IV SOLN

[Series 3: DWI · axial · 3.0mm · 1.09mm/px · z∈[-31,+127]mm · 7 of 108 slices shown (1 of 4)]
[im 1/108]
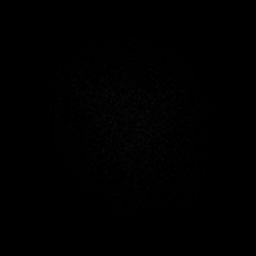
[im 18/108]
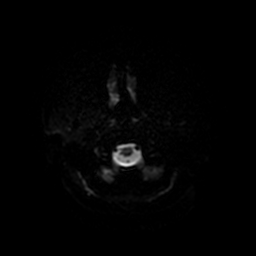
[im 36/108]
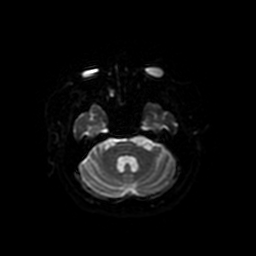
[im 54/108]
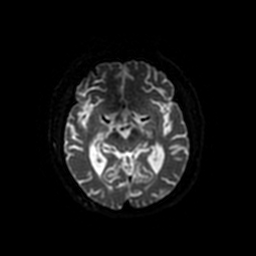
[im 72/108]
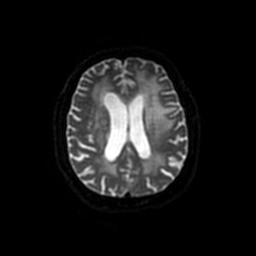
[im 90/108]
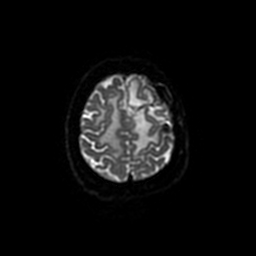
[im 108/108]
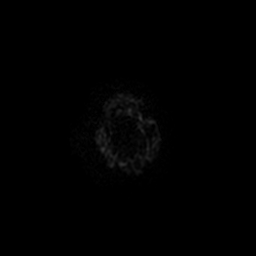

[Series 6: FLAIR · axial · 3.0mm · 0.43mm/px · z∈[-19,+130]mm · 2 of 26 slices shown]
[im 1/26]
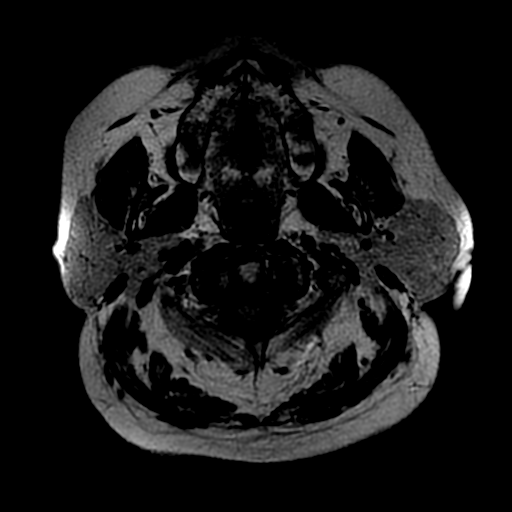
[im 26/26]
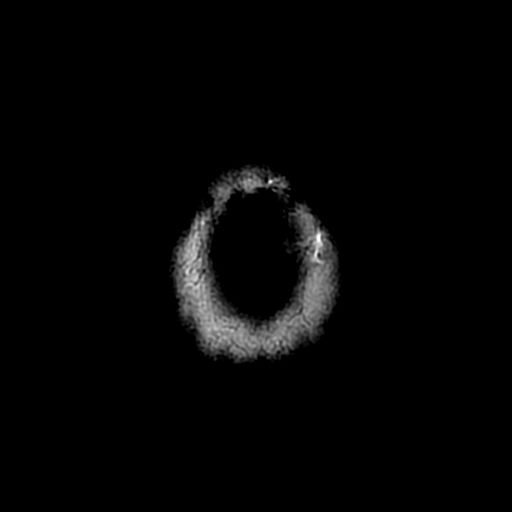

[Series 9: DWI · coronal · 4.0mm · 1.09mm/px · 7 of 92 slices shown (2 of 4)]
[im 1/92]
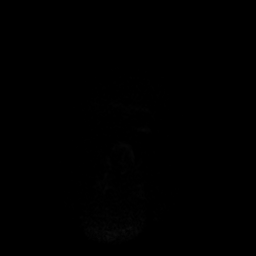
[im 16/92]
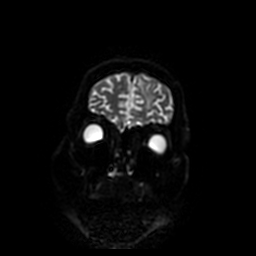
[im 31/92]
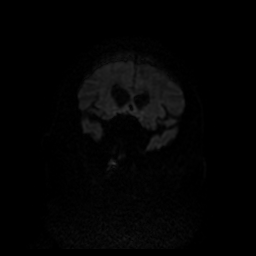
[im 46/92]
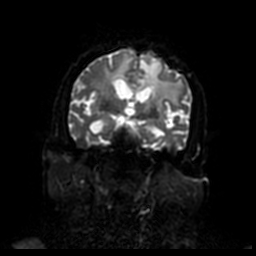
[im 61/92]
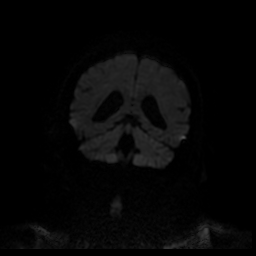
[im 76/92]
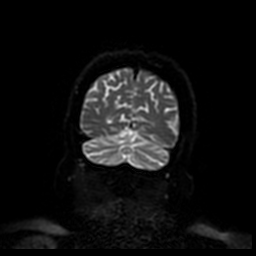
[im 92/92]
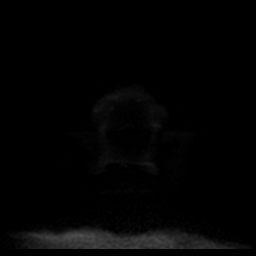

[Series 11: T1 post-contrast · axial · 2.0mm · 0.47mm/px · z∈[-21,+132]mm · 6 of 78 slices shown (1 of 3)]
[im 1/78]
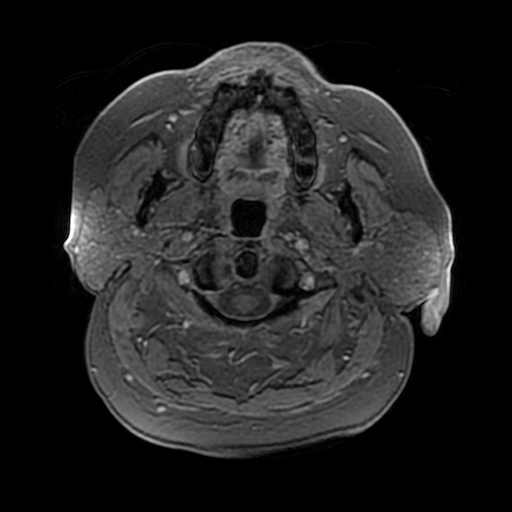
[im 16/78]
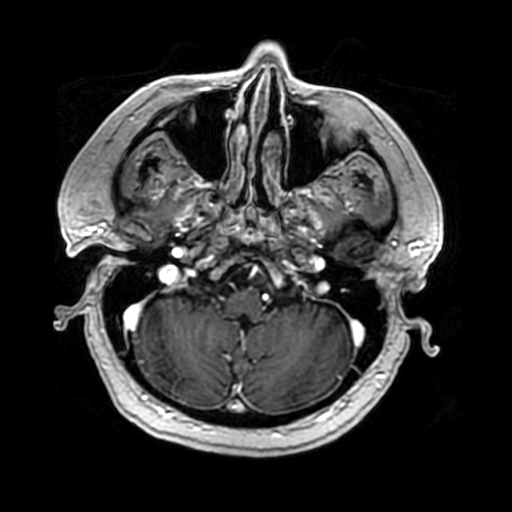
[im 31/78]
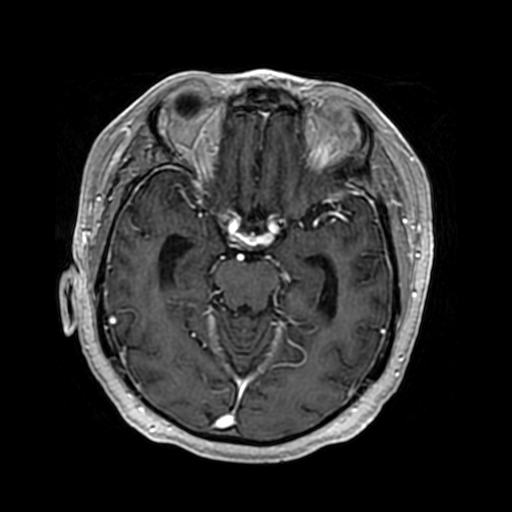
[im 47/78]
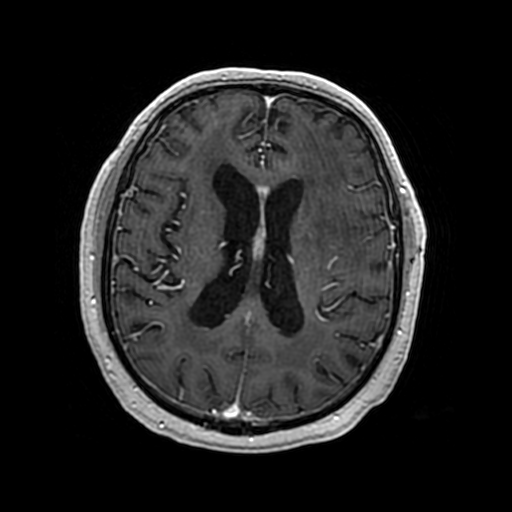
[im 62/78]
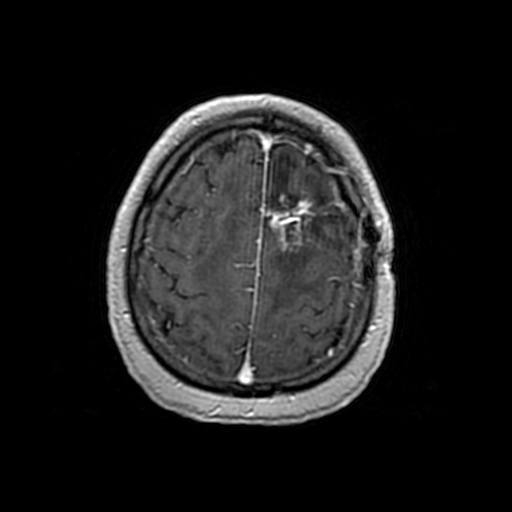
[im 78/78]
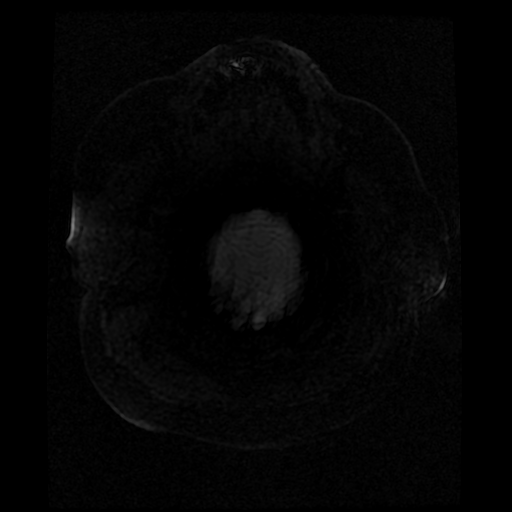

[Series 12: T1 post-contrast · coronal · 5.0mm · 0.43mm/px · 2 of 29 slices shown (2 of 3)]
[im 1/29]
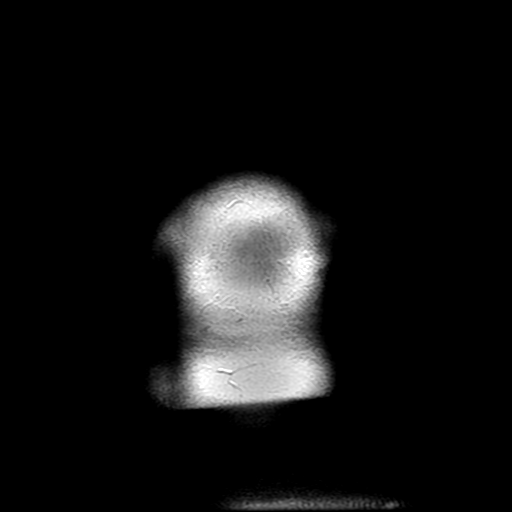
[im 29/29]
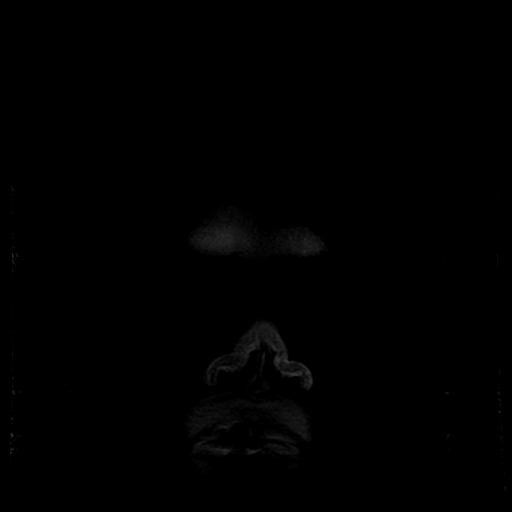

[Series 13: T1 post-contrast · sagittal · 5.0mm · 0.47mm/px · 1 of 24 slices shown (3 of 3)]
[im 1/24]
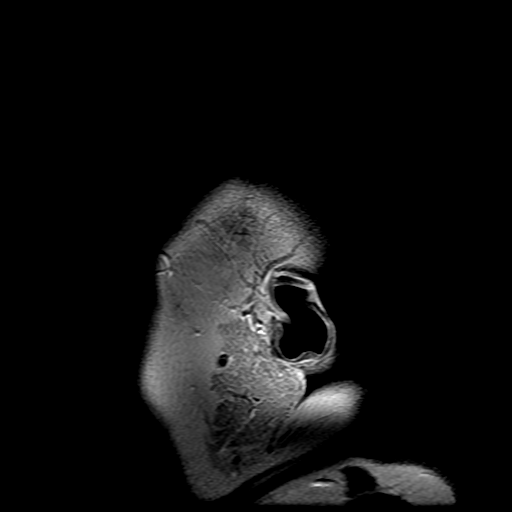

[Series 300: DWI · axial · 3.0mm · 1.09mm/px · z∈[-31,+127]mm · 4 of 54 slices shown (3 of 4)]
[im 1/54]
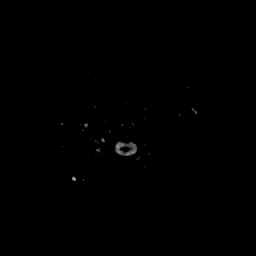
[im 18/54]
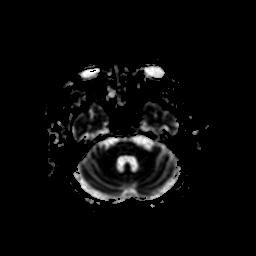
[im 36/54]
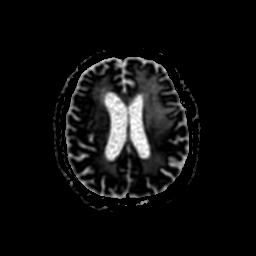
[im 54/54]
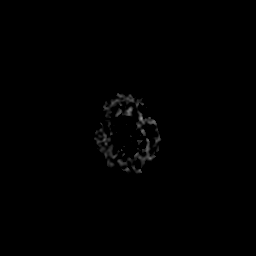

[Series 900: DWI · coronal · 4.0mm · 1.09mm/px · 3 of 46 slices shown (4 of 4)]
[im 1/46]
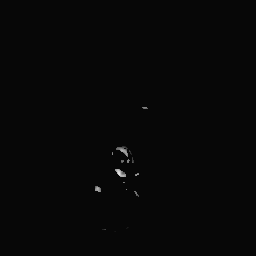
[im 23/46]
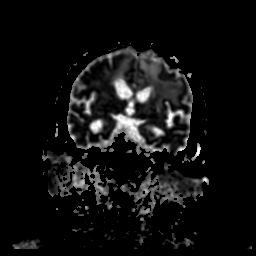
[im 46/46]
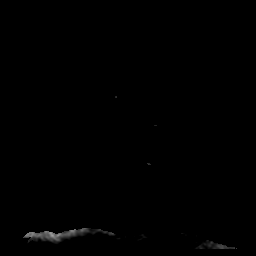

[32 of 48 positions shown; findings below may reference images not displayed]

FINDINGS: Brain: Increased thickness of nodular, irregular enhancement in the
superior left frontal gyrus contiguous with the anterior body of the
left lateral ventricle, and chronically involving the body of the
corpus callosum. But the enhancing area is only slightly larger in
the AP dimension: 38 by 27 by 33 millimeters (AP by transverse by
CC), previously 35 x 28 x 32 millimeters.

Regional abnormal diffusion is stable. Conspicuity of enhancement
along the posterior margin of involved corpus callosum is increased
on series 12, image 15 (versus series 12, image 13 previously).

On T2 imaging widespread left frontal lobe T2 hyperintensity
surrounding nodular more decreased T2 signal tumor (series 10, image
17) is largely stable. There is increased T2 signal in the posterior
cingulate gyrus on the left (series 10, image 11 today versus series
10, image 10 previously). No other interval gyral expansion. And T2
signal crossing the corpus callosum and in the contralateral right
frontal lobe appears stable.

T2 hyperintensity in the left deep gray nuclei has mildly increased
on series 5, image 15.

No new areas of abnormal enhancement. Stable overlying craniotomy
changes.

No superimposed restricted diffusion suggestive of acute infarction.
No midline shift, mass effect, ventriculomegaly, or acute
intracranial hemorrhage. Cervicomedullary junction and pituitary are
within normal limits.

Vascular: Major intracranial vascular flow voids are stable. The
major dural venous sinuses are enhancing and appear to be patent.

Skull and upper cervical spine: Stable visible cervical spine
degeneration. Visualized bone marrow signal is within normal limits.

Sinuses/Orbits: Stable and negative.

Other: Mastoids remain clear. Visible internal auditory structures
appear normal.
IMPRESSION: Features of mild progression since the [facility] MRI:

- mildly increased thickness of irregular enhancement in the left
frontal gyrus contiguous with the left lateral ventricle.
- mildly increased enhancement of the involved body of the corpus
callosum.

- mild increased T2 signal in the posterior left cingulate gyrus,
and the left deep gray nuclei.

Overall tumor size not significantly changed, and no new or
increased intracranial mass effect.
